# Patient Record
Sex: Male | Born: 1960 | Race: Black or African American | Hispanic: No | Marital: Married | Smoking: Former smoker
Health system: Southern US, Community
[De-identification: ages and names within clinical notes are randomized; demographics above are authoritative.]

## PROBLEM LIST (undated history)

## (undated) DIAGNOSIS — J40 Bronchitis, not specified as acute or chronic: Secondary | ICD-10-CM

## (undated) DIAGNOSIS — R3915 Urgency of urination: Secondary | ICD-10-CM

## (undated) DIAGNOSIS — D631 Anemia in chronic kidney disease: Secondary | ICD-10-CM

## (undated) DIAGNOSIS — J189 Pneumonia, unspecified organism: Secondary | ICD-10-CM

## (undated) DIAGNOSIS — N186 End stage renal disease: Secondary | ICD-10-CM

## (undated) DIAGNOSIS — F32A Depression, unspecified: Secondary | ICD-10-CM

## (undated) DIAGNOSIS — N2581 Secondary hyperparathyroidism of renal origin: Secondary | ICD-10-CM

## (undated) DIAGNOSIS — N529 Male erectile dysfunction, unspecified: Secondary | ICD-10-CM

## (undated) DIAGNOSIS — N179 Acute kidney failure, unspecified: Secondary | ICD-10-CM

## (undated) DIAGNOSIS — G4733 Obstructive sleep apnea (adult) (pediatric): Secondary | ICD-10-CM

## (undated) DIAGNOSIS — R0989 Other specified symptoms and signs involving the circulatory and respiratory systems: Secondary | ICD-10-CM

## (undated) DIAGNOSIS — T7840XA Allergy, unspecified, initial encounter: Secondary | ICD-10-CM

## (undated) DIAGNOSIS — F419 Anxiety disorder, unspecified: Secondary | ICD-10-CM

## (undated) DIAGNOSIS — E669 Obesity, unspecified: Secondary | ICD-10-CM

## (undated) DIAGNOSIS — I10 Essential (primary) hypertension: Secondary | ICD-10-CM

## (undated) DIAGNOSIS — K2950 Unspecified chronic gastritis without bleeding: Secondary | ICD-10-CM

## (undated) DIAGNOSIS — C7951 Secondary malignant neoplasm of bone: Secondary | ICD-10-CM

## (undated) DIAGNOSIS — Z860101 Personal history of adenomatous and serrated colon polyps: Secondary | ICD-10-CM

## (undated) DIAGNOSIS — R600 Localized edema: Secondary | ICD-10-CM

## (undated) DIAGNOSIS — D649 Anemia, unspecified: Secondary | ICD-10-CM

## (undated) DIAGNOSIS — E872 Acidosis: Secondary | ICD-10-CM

## (undated) DIAGNOSIS — Z9889 Other specified postprocedural states: Secondary | ICD-10-CM

## (undated) DIAGNOSIS — N189 Chronic kidney disease, unspecified: Secondary | ICD-10-CM

## (undated) DIAGNOSIS — R002 Palpitations: Secondary | ICD-10-CM

## (undated) DIAGNOSIS — M1990 Unspecified osteoarthritis, unspecified site: Secondary | ICD-10-CM

## (undated) DIAGNOSIS — N184 Chronic kidney disease, stage 4 (severe): Secondary | ICD-10-CM

## (undated) DIAGNOSIS — Z992 Dependence on renal dialysis: Secondary | ICD-10-CM

## (undated) DIAGNOSIS — N1330 Unspecified hydronephrosis: Secondary | ICD-10-CM

## (undated) DIAGNOSIS — R0981 Nasal congestion: Secondary | ICD-10-CM

## (undated) DIAGNOSIS — K219 Gastro-esophageal reflux disease without esophagitis: Secondary | ICD-10-CM

## (undated) DIAGNOSIS — S52209A Unspecified fracture of shaft of unspecified ulna, initial encounter for closed fracture: Secondary | ICD-10-CM

## (undated) DIAGNOSIS — C61 Malignant neoplasm of prostate: Secondary | ICD-10-CM

## (undated) DIAGNOSIS — I12 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 stage renal disease: Secondary | ICD-10-CM

## (undated) DIAGNOSIS — Z973 Presence of spectacles and contact lenses: Secondary | ICD-10-CM

## (undated) DIAGNOSIS — R7301 Impaired fasting glucose: Secondary | ICD-10-CM

## (undated) DIAGNOSIS — Z8619 Personal history of other infectious and parasitic diseases: Secondary | ICD-10-CM

## (undated) DIAGNOSIS — N185 Chronic kidney disease, stage 5: Secondary | ICD-10-CM

## (undated) DIAGNOSIS — A6002 Herpesviral infection of other male genital organs: Secondary | ICD-10-CM

## (undated) DIAGNOSIS — M109 Gout, unspecified: Secondary | ICD-10-CM

## (undated) DIAGNOSIS — Z8719 Personal history of other diseases of the digestive system: Secondary | ICD-10-CM

## (undated) DIAGNOSIS — Z86010 Personal history of colonic polyps: Secondary | ICD-10-CM

## (undated) HISTORY — DX: Male erectile dysfunction, unspecified: N52.9

## (undated) HISTORY — PX: UPPER GASTROINTESTINAL ENDOSCOPY: SHX188

## (undated) HISTORY — DX: Herpesviral infection of other male genital organs: A60.02

## (undated) HISTORY — DX: Obesity, unspecified: E66.9

## (undated) HISTORY — DX: Essential (primary) hypertension: I10

## (undated) HISTORY — PX: COLONOSCOPY: SHX174

## (undated) HISTORY — DX: Allergy, unspecified, initial encounter: T78.40XA

## (undated) HISTORY — DX: Impaired fasting glucose: R73.01

---
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
## 2001-05-12 ENCOUNTER — Encounter: Payer: Self-pay | Admitting: Emergency Medicine

## 2001-05-12 ENCOUNTER — Emergency Department (HOSPITAL_COMMUNITY): Admission: EM | Admit: 2001-05-12 | Discharge: 2001-05-12 | Payer: Self-pay | Admitting: Emergency Medicine

## 2001-05-20 ENCOUNTER — Emergency Department (HOSPITAL_COMMUNITY): Admission: EM | Admit: 2001-05-20 | Discharge: 2001-05-20 | Payer: Self-pay | Admitting: Emergency Medicine

## 2004-10-02 DIAGNOSIS — I10 Essential (primary) hypertension: Secondary | ICD-10-CM

## 2004-10-02 HISTORY — DX: Essential (primary) hypertension: I10

## 2005-08-04 HISTORY — PX: TRICEPS TENDON REPAIR: SHX2577

## 2006-01-30 ENCOUNTER — Emergency Department (HOSPITAL_COMMUNITY): Admission: EM | Admit: 2006-01-30 | Discharge: 2006-01-30 | Payer: Self-pay

## 2006-03-13 ENCOUNTER — Inpatient Hospital Stay (HOSPITAL_COMMUNITY): Admission: AD | Admit: 2006-03-13 | Discharge: 2006-03-15 | Payer: Self-pay | Admitting: Orthopedic Surgery

## 2006-08-23 ENCOUNTER — Ambulatory Visit: Payer: Self-pay | Admitting: Family Medicine

## 2007-06-25 ENCOUNTER — Ambulatory Visit: Payer: Self-pay | Admitting: Family Medicine

## 2007-08-29 ENCOUNTER — Ambulatory Visit: Payer: Self-pay | Admitting: Family Medicine

## 2007-11-07 ENCOUNTER — Ambulatory Visit: Payer: Self-pay | Admitting: Family Medicine

## 2008-03-24 ENCOUNTER — Ambulatory Visit: Payer: Self-pay | Admitting: Family Medicine

## 2008-03-27 ENCOUNTER — Ambulatory Visit: Payer: Self-pay | Admitting: Family Medicine

## 2009-05-13 ENCOUNTER — Ambulatory Visit: Payer: Self-pay | Admitting: Family Medicine

## 2009-08-31 ENCOUNTER — Ambulatory Visit: Payer: Self-pay | Admitting: Family Medicine

## 2009-12-01 ENCOUNTER — Ambulatory Visit: Payer: Self-pay | Admitting: Physician Assistant

## 2010-06-14 ENCOUNTER — Ambulatory Visit: Payer: Self-pay | Admitting: Family Medicine

## 2010-07-26 ENCOUNTER — Ambulatory Visit
Admission: RE | Admit: 2010-07-26 | Discharge: 2010-07-26 | Payer: Self-pay | Source: Home / Self Care | Attending: Family Medicine | Admitting: Family Medicine

## 2010-08-23 ENCOUNTER — Ambulatory Visit
Admission: RE | Admit: 2010-08-23 | Discharge: 2010-08-23 | Disposition: A | Discharge status: Home / Self Care | Payer: Managed Care, Other (non HMO) | Source: Ambulatory Visit | Attending: Family Medicine | Admitting: Family Medicine

## 2010-08-23 ENCOUNTER — Other Ambulatory Visit: Payer: Self-pay | Admitting: Family Medicine

## 2010-08-23 DIAGNOSIS — M542 Cervicalgia: Secondary | ICD-10-CM

## 2010-11-19 NOTE — Op Note (Signed)
NAME:  Devon Cook, PUENTE NO.:  [id number]   MEDICAL RECORD NO.:  [id number]          PATIENT TYPE:  INP   LOCATION:  5011                         FACILITY:  [facility]   PHYSICIAN:  Robert A. Noemi Chapel, M.D. DATE OF BIRTH:  [birth year]   DATE OF PROCEDURE:  03/13/2006  DATE OF DISCHARGE:                                 OPERATIVE REPORT   PREOPERATIVE DIAGNOSIS:  Left arm infection status post left triceps tendon  rupture repair.   POSTOPERATIVE DIAGNOSIS:  Left arm infection status post left triceps tendon  rupture repair.   PROCEDURE:  Left arm irrigation and debridement with partial triceps tendon  repair debridement.   SURGEON:  Audree Camel. Noemi Chapel, M.D.   ASSISTANT:  Matthew Saras, P.A.   ANESTHESIA:  General.   OPERATIVE TIME:  45 minutes.   COMPLICATIONS:  None.   INDICATIONS FOR PROCEDURE:  Mr. Bajor is a 50 year old gentleman who  sustained a left triceps tendon rupture approximately 6 weeks ago.  Underwent a triceps tendon repair approximately 4 weeks ago.  He has done  well with his initial recovery until the last 2 to 3 days when he started  developing increased pain, swelling and fevers.  Presented to the office  today with obvious infection in his left arm.  Aspiration showed purulent  material and is now to undergo irrigation, debridement of this.   DESCRIPTION:  Mr. Tucker was brought to operating room on 03/13/2006, placed  operative table supine position.  After adequate level general anesthesia  was obtained, his left arm was prepped using sterile DuraPrep and draped  using sterile technique.  He received vancomycin 1.5 grams IV  preoperatively.  He had already had cultures taken in the office.  These  have shown gram positive cocci in pairs and clusters.  Arm was exsanguinated  and a sterile tourniquet elevated 300 mm.  Initially through a 8-10 mm  longitudinal incision made over his previous incision, first triceps tendon  repair, initial  exposure was made.  The underlying subcutaneous tissues were  incised in line with the skin incision.  On entering the triceps tendon  region, purulent material was found and this was thoroughly irrigated.  The  ulnar nerve and radial nerve carefully protected while the triceps tendon  area was evaluated.  There was partial loss of suture fixation but at least  50% of the triceps tendon remained well attached.  The previous bone anchor  was still in place in the olecranon.  This was removed.  It was buried in  the bone.  After a thorough debridement had been carried out and 3 liters of  jet lavage solution were irrigated through the area, the joint was not  entered because it was not felt to be part of the infection.  After this  irrigation and debridement had been carried out, the tourniquet was  released.  Hemostasis was obtained with cautery.  I did not feel that  putting further sutures in the triceps tendon. i.e. a foreign material,  would be in the best interest of the infection treatment at this time.  At  this point it was felt that the arm could be splinted in full extension in  order to take any the pressure off the remaining portions of the  repair.  The wound was closed by closing only the skin using 2-0 nylon  retention sutures.  Sterile dressings were applied and then the arm was  splinted in full extension.  The patient awakened and taken to recovery in  stable condition.  Needle and sponge count was correct x2 at the end of the  case.      Robert A. Noemi Chapel, M.D.  Electronically Signed     RAW/MEDQ  D:  03/13/2006  T:  03/14/2006  Job:  [id number]

## 2010-11-19 NOTE — Discharge Summary (Signed)
NAME:  Devon Cook, CHENG NO.:  [id number]   MEDICAL RECORD NO.:  [id number]          PATIENT TYPE:  INP   LOCATION:  5011                         FACILITY:  [facility]   PHYSICIAN:  Robert A. Noemi Chapel, M.D. DATE OF BIRTH:  [birth year]   DATE OF ADMISSION:  03/13/2006  DATE OF DISCHARGE:  03/15/2006                                 DISCHARGE SUMMARY   ADMITTING DIAGNOSIS:  Infection left elbow, status post triceps repair.   HISTORY OF PRESENT ILLNESS:  The patient is a 50 year old white male who  underwent an acute repair of a ruptured triceps on February 10, 2006, as an  outpatient.  Over the past couple of days, he has increased pain.  Over the  weekend he had a fever of 103.  He presented to the office with a very large  painful swollen elbow.   Aspiration was undertaken in the office that showed gram positive cocci.  He  was admitted for emergent I&D and IV antibiotics.  The patient understands  risks, benefits, and possible complications of the procedure and is without  question.   PROCEDURES IN-HOUSE:  On March 13, 2006, the patient underwent I&D of  his left elbow by Dr. Noemi Chapel.   He tolerated the procedure well and was admitted postoperatively for pain  control and IV antibiotics.  He was started on vancomycin prophylactically  after cultures were obtained.  Post-op day #1 his hemoglobin was 10.3.  His  white cell count was 11.2.  His gram stain showed gram positive cocci in  clusters.  He was metabolically stable and in much less pain.  Post-op day  #2, cultures grew out Staph aureus that was sensitive to Levaquin but not to  penicillin.   He was discharged to home on:  1. Levaquin 750, one tablet a day.  2. Percocet 5/325, one to two every four to six hours p.r.n. pain.  3. Robaxin 750 mg, one q.6 h p.r.n. muscle spasm .  4. Benicar 20 mg daily.  5. Colace 100 mg one p.o. b.i.d.   He has been instructed to elevate his left arm on many pillows, not to  lift  anything heavy, and not to get his dressing wet.   He will follow up with Dr. Noemi Chapel on March 20, 2006, for a wound check  at that time.     Kirstin Shepperson, P.A.      Robert A. Noemi Chapel, M.D.  Electronically Signed   KS/MEDQ  D:  04/12/2006  T:  04/14/2006  Job:  [id number]

## 2010-11-22 ENCOUNTER — Ambulatory Visit (INDEPENDENT_AMBULATORY_CARE_PROVIDER_SITE_OTHER): Payer: Managed Care, Other (non HMO) | Admitting: Family Medicine

## 2010-11-22 ENCOUNTER — Encounter: Payer: Self-pay | Admitting: Family Medicine

## 2010-11-22 VITALS — BP 120/88 | HR 78 | Wt 304.0 lb

## 2010-11-22 DIAGNOSIS — B351 Tinea unguium: Secondary | ICD-10-CM

## 2010-11-22 DIAGNOSIS — A599 Trichomoniasis, unspecified: Secondary | ICD-10-CM

## 2010-11-22 MED ORDER — METRONIDAZOLE 500 MG PO TABS: 500.0000 mg | ORAL_TABLET | ORAL | Status: AC

## 2010-11-22 NOTE — Patient Instructions (Signed)
Take all the medication and if any further problems give me a call

## 2010-11-22 NOTE — Progress Notes (Signed)
  Subjective:    Patient ID: Devon Cook, male    DOB: [birth year], 50 y.o.   MRN: [id number]  HPI he is here for evaluation of discoloration of his great toe. He also apparently was recently told that he could potentially have Trichomonas. He is having no urinary symptoms.    Review of Systems     Objective:   Physical Exam exam of his left great toe does show thickening and some slight discoloration. Exam of the penis shows an uncircumcised male with no evidence of infection.        Assessment & Plan:  Onychomycosis Probable trichomoniasis I explained the treatment of onychomycosis and at this time he will for any further therapy. Flagyl was called in with comments about alcohol consumption while taking this.

## 2011-04-04 ENCOUNTER — Telehealth: Payer: Self-pay | Admitting: Family Medicine

## 2011-04-04 NOTE — Telephone Encounter (Signed)
Let him know that he really needs somebody to examine him and make sure what's going on. Recommend he go to an urgent care Center

## 2011-04-04 NOTE — Telephone Encounter (Signed)
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  Pt having UTI and wants same antibiotic as you gave him in May 2012.  He is in [place] and needs some relief.  Pt said call it into [facility] on [address] and he can stop at [facility] in [place] for refill.  I advised pt, not sure doctor would refill.  Please advise pt.

## 2011-04-04 NOTE — Telephone Encounter (Signed)
Called pt informed him Dr.L wanted him to be examined to go to an urgent care were he is at

## 2011-04-11 ENCOUNTER — Ambulatory Visit: Payer: Managed Care, Other (non HMO) | Admitting: Family Medicine

## 2011-04-26 ENCOUNTER — Encounter: Payer: Self-pay | Admitting: Family Medicine

## 2011-08-29 ENCOUNTER — Other Ambulatory Visit: Payer: Self-pay | Admitting: Family Medicine

## 2011-08-29 NOTE — Telephone Encounter (Signed)
Pt need Lisinopril HCTZ 20/25 mg 1 qd at [facility].  Out of refills at the pharmacy.  Pt scheduled CPE for  10/04/11.

## 2011-10-04 ENCOUNTER — Encounter: Payer: Managed Care, Other (non HMO) | Admitting: Family Medicine

## 2011-11-29 ENCOUNTER — Encounter: Payer: Managed Care, Other (non HMO) | Admitting: Family Medicine

## 2011-12-22 ENCOUNTER — Encounter: Payer: Managed Care, Other (non HMO) | Admitting: Family Medicine

## 2012-01-09 ENCOUNTER — Ambulatory Visit (INDEPENDENT_AMBULATORY_CARE_PROVIDER_SITE_OTHER): Payer: BC Managed Care – PPO | Admitting: Family Medicine

## 2012-01-09 ENCOUNTER — Encounter: Payer: Self-pay | Admitting: Family Medicine

## 2012-01-09 VITALS — BP 140/80 | HR 77 | Ht 72.5 in | Wt 310.0 lb

## 2012-01-09 DIAGNOSIS — R7301 Impaired fasting glucose: Secondary | ICD-10-CM | POA: Insufficient documentation

## 2012-01-09 DIAGNOSIS — I10 Essential (primary) hypertension: Secondary | ICD-10-CM

## 2012-01-09 DIAGNOSIS — Z0000 Encounter for general adult medical examination without abnormal findings: Secondary | ICD-10-CM

## 2012-01-09 DIAGNOSIS — N529 Male erectile dysfunction, unspecified: Secondary | ICD-10-CM

## 2012-01-09 DIAGNOSIS — Z6841 Body Mass Index (BMI) 40.0 and over, adult: Secondary | ICD-10-CM | POA: Insufficient documentation

## 2012-01-09 DIAGNOSIS — Z23 Encounter for immunization: Secondary | ICD-10-CM

## 2012-01-09 LAB — COMPREHENSIVE METABOLIC PANEL
ALT: 95 U/L — ABNORMAL HIGH (ref 0–53)
CO2: 25 mEq/L (ref 19–32)
Calcium: 8.6 mg/dL (ref 8.4–10.5)
Chloride: 107 mEq/L (ref 96–112)
Creat: 1.02 mg/dL (ref 0.50–1.35)
Total Protein: 7.2 g/dL (ref 6.0–8.3)

## 2012-01-09 LAB — CBC WITH DIFFERENTIAL/PLATELET
Eosinophils Relative: 3 % (ref 0–5)
HCT: 43.2 % (ref 39.0–52.0)
Hemoglobin: 15.5 g/dL (ref 13.0–17.0)
Lymphocytes Relative: 34 % (ref 12–46)
Lymphs Abs: 2.7 10*3/uL (ref 0.7–4.0)
MCV: 75.5 fL — ABNORMAL LOW (ref 78.0–100.0)
Monocytes Absolute: 1 10*3/uL (ref 0.1–1.0)
Platelets: 168 10*3/uL (ref 150–400)
RBC: 5.72 MIL/uL (ref 4.22–5.81)
WBC: 7.7 10*3/uL (ref 4.0–10.5)

## 2012-01-09 LAB — LIPID PANEL
Cholesterol: 96 mg/dL (ref 0–200)
Total CHOL/HDL Ratio: 2.5 Ratio

## 2012-01-09 LAB — POCT URINALYSIS DIPSTICK
Bilirubin, UA: NEGATIVE
Glucose, UA: NEGATIVE
Ketones, UA: NEGATIVE
Spec Grav, UA: 1.01

## 2012-01-09 MED ORDER — TADALAFIL 20 MG PO TABS: 10.0000 mg | ORAL_TABLET | ORAL | Status: DC | PRN

## 2012-01-09 MED ORDER — LISINOPRIL-HYDROCHLOROTHIAZIDE 20-25 MG PO TABS: 1.0000 | ORAL_TABLET | Freq: Every day | ORAL | Status: DC

## 2012-01-09 NOTE — Progress Notes (Signed)
Subjective:    Patient ID: Devon Cook, male    DOB: 07/14/1960, 51 y.o.   MRN: [id number]  HPI He is here for complete examination. He recently had a DOT exam which did show protein in his urine however the day before that he did play golf. He continues to have difficulty with erectile dysfunction and would like a refill on his Cialis. He continues on lisinopril and is having no difficulty with this. He has lost weight in the past when he put himself on a diet and exercise program however he's gained it back. 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 His social history was reviewed. Family history significant for uncles and grandfather with prostate cancer.  Review of Systems  Constitutional: Negative for activity change.  HENT: Negative.   Eyes: Negative.   Respiratory: Negative.   Cardiovascular: Negative.   Gastrointestinal: Negative.   Genitourinary: Negative.   Musculoskeletal: Negative.   Neurological: Negative.        Objective:   Physical Exam BP 140/80  Pulse 77  Ht 6' 0.5" (1.842 m)  Wt 310 lb (140.615 kg)  BMI 41.47 kg/m2  SpO2 96%  General Appearance:    Alert, cooperative, no distress, appears stated age  Head:    Normocephalic, without obvious abnormality, atraumatic  Eyes:    PERRL, conjunctiva/corneas clear, EOM's intact, fundi    benign  Ears:    Normal TM's and external ear canals  Nose:   Nares normal, mucosa normal, no drainage or sinus   tenderness  Throat:   Lips, mucosa, and tongue normal; teeth and gums normal  Neck:   Supple, no lymphadenopathy;  thyroid:  no   enlargement/tenderness/nodules; no carotid   bruit or JVD  Back:    Spine nontender, no curvature, ROM normal, no CVA     tenderness  Lungs:     Clear to auscultation bilaterally without wheezes, rales or     ronchi; respirations unlabored  Chest Wall:    No tenderness or deformity   Heart:    Regular rate and rhythm, S1 and S2 normal, no  murmur, rub   or gallop  Breast Exam:    No chest wall tenderness, masses or gynecomastia  Abdomen:     Soft, non-tender, nondistended, normoactive bowel sounds,    no masses, no hepatosplenomegaly  Genitalia:    Normal male external genitalia without lesions.  Testicles without masses.  No inguinal hernias.     Extremities:   No clubbing, cyanosis or edema  Pulses:   2+ and symmetric all extremities  Skin:   Skin color, texture, turgor normal, no rashes or lesions  Lymph nodes:   Cervical, supraclavicular, and axillary nodes normal  Neurologic:   CNII-XII intact, normal strength, sensation and gait; reflexes 2+ and symmetric throughout          Psych:   Normal mood, affect, hygiene and grooming.          Assessment & Plan:   1. Routine general medical examination at a health care facility  POCT Urinalysis Dipstick, CBC with Differential, Comprehensive metabolic panel, Lipid panel, HM COLONOSCOPY, Tdap vaccine greater than or equal to 7yo IM, PSA  2. ED (erectile dysfunction)  tadalafil (CIALIS) 20 MG tablet  3. Hypertension  lisinopril-hydrochlorothiazide (PRINZIDE,ZESTORETIC) 20-25 MG per tablet  4. Morbid obesity with BMI of 40.0-44.9, adult  Amb ref to Medical Nutrition Therapy-MNT  5. Impaired fasting glucose  discuss with him the need to make permanent lifestyle changes in regard to his physical activities and eating habits. I will refer him to nutritionist and followup in several months.

## 2012-09-16 ENCOUNTER — Emergency Department (HOSPITAL_COMMUNITY): Payer: BC Managed Care – PPO

## 2012-09-16 ENCOUNTER — Emergency Department (HOSPITAL_COMMUNITY)
Admission: EM | Admit: 2012-09-16 | Discharge: 2012-09-16 | Disposition: A | Discharge status: Home / Self Care | Payer: BC Managed Care – PPO | Attending: Emergency Medicine | Admitting: Emergency Medicine

## 2012-09-16 ENCOUNTER — Encounter (HOSPITAL_COMMUNITY): Payer: Self-pay | Admitting: Emergency Medicine

## 2012-09-16 DIAGNOSIS — F17200 Nicotine dependence, unspecified, uncomplicated: Secondary | ICD-10-CM | POA: Insufficient documentation

## 2012-09-16 DIAGNOSIS — S058X9A Other injuries of unspecified eye and orbit, initial encounter: Secondary | ICD-10-CM | POA: Insufficient documentation

## 2012-09-16 DIAGNOSIS — H1131 Conjunctival hemorrhage, right eye: Secondary | ICD-10-CM

## 2012-09-16 DIAGNOSIS — I10 Essential (primary) hypertension: Secondary | ICD-10-CM | POA: Insufficient documentation

## 2012-09-16 DIAGNOSIS — M7989 Other specified soft tissue disorders: Secondary | ICD-10-CM

## 2012-09-16 DIAGNOSIS — S0501XA Injury of conjunctiva and corneal abrasion without foreign body, right eye, initial encounter: Secondary | ICD-10-CM

## 2012-09-16 DIAGNOSIS — S6990XA Unspecified injury of unspecified wrist, hand and finger(s), initial encounter: Secondary | ICD-10-CM | POA: Insufficient documentation

## 2012-09-16 DIAGNOSIS — S0180XA Unspecified open wound of other part of head, initial encounter: Secondary | ICD-10-CM | POA: Insufficient documentation

## 2012-09-16 DIAGNOSIS — Z87448 Personal history of other diseases of urinary system: Secondary | ICD-10-CM | POA: Insufficient documentation

## 2012-09-16 DIAGNOSIS — E669 Obesity, unspecified: Secondary | ICD-10-CM | POA: Insufficient documentation

## 2012-09-16 DIAGNOSIS — Z79899 Other long term (current) drug therapy: Secondary | ICD-10-CM | POA: Insufficient documentation

## 2012-09-16 DIAGNOSIS — H1130 Conjunctival hemorrhage, unspecified eye: Secondary | ICD-10-CM | POA: Insufficient documentation

## 2012-09-16 DIAGNOSIS — Z8619 Personal history of other infectious and parasitic diseases: Secondary | ICD-10-CM | POA: Insufficient documentation

## 2012-09-16 MED ORDER — IBUPROFEN 800 MG PO TABS: 800.0000 mg | ORAL_TABLET | Freq: Three times a day (TID) | ORAL | Status: DC | PRN

## 2012-09-16 MED ORDER — CIPROFLOXACIN HCL 0.3 % OP SOLN: 1.0000 [drp] | Freq: Four times a day (QID) | OPHTHALMIC | Status: DC

## 2012-09-16 MED ORDER — AMOXICILLIN-POT CLAVULANATE 875-125 MG PO TABS: 1.0000 | ORAL_TABLET | Freq: Two times a day (BID) | ORAL | Status: DC

## 2012-09-16 NOTE — ED Notes (Signed)
Pt presenting to ed with c/o injuring bilateral thumbs and bilateral hands yesterday while playing basketball.
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
## 2012-09-16 NOTE — ED Notes (Signed)
Eye box at bedside

## 2012-09-18 NOTE — ED Provider Notes (Signed)
Medical screening examination/treatment/procedure(s) were performed by non-physician practitioner and as supervising physician I was immediately available for consultation/collaboration.    Kathalene Frames, MD 09/18/12 [phone]

## 2012-10-09 ENCOUNTER — Telehealth: Payer: Self-pay | Admitting: Family Medicine

## 2012-10-09 NOTE — Telephone Encounter (Signed)
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  He in coming through [place].  He will not be into [place] until Friday.  I asked if he would stop when he gets to a [facility], we will call in the Lisinopril [facility].  He will do so.

## 2012-10-10 ENCOUNTER — Telehealth: Payer: Self-pay | Admitting: Family Medicine

## 2012-10-10 NOTE — Telephone Encounter (Signed)
Needs refill called in, he is  out of town Lisinopril  [place]

## 2012-10-11 ENCOUNTER — Other Ambulatory Visit: Payer: Self-pay

## 2012-10-11 DIAGNOSIS — I10 Essential (primary) hypertension: Secondary | ICD-10-CM

## 2012-10-11 MED ORDER — LISINOPRIL-HYDROCHLOROTHIAZIDE 20-25 MG PO TABS: 1.0000 | ORAL_TABLET | Freq: Every day | ORAL | Status: DC

## 2012-10-11 NOTE — Telephone Encounter (Signed)
THIS HAS ALREADY BEEN TAKEN CARE OF

## 2012-10-11 NOTE — Telephone Encounter (Signed)
Sent in b/p med

## 2012-10-11 NOTE — Telephone Encounter (Signed)
This is Dr. Lanice Shirts patient, thus, sending to you for refill

## 2012-10-11 NOTE — Telephone Encounter (Signed)
Renew this to cover him until July.

## 2013-01-31 ENCOUNTER — Telehealth: Payer: Self-pay | Admitting: Family Medicine

## 2013-01-31 NOTE — Telephone Encounter (Signed)
Please call pt as he wants a referral to a urologist

## 2013-01-31 NOTE — Telephone Encounter (Signed)
DR.LALONDE IS THIS OK

## 2013-01-31 NOTE — Telephone Encounter (Signed)
Need find out why. If it is for a vasectomy than yes . Otherwise probably schedule with me

## 2013-02-04 NOTE — Telephone Encounter (Signed)
Pt wants to be circumcised ok by jcl to refer faxed referral to [facility] urology

## 2013-04-24 ENCOUNTER — Telehealth: Payer: Self-pay | Admitting: Family Medicine

## 2013-04-24 ENCOUNTER — Other Ambulatory Visit: Payer: Self-pay

## 2013-04-24 DIAGNOSIS — I10 Essential (primary) hypertension: Secondary | ICD-10-CM

## 2013-04-24 MED ORDER — LISINOPRIL-HYDROCHLOROTHIAZIDE 20-25 MG PO TABS: 1.0000 | ORAL_TABLET | Freq: Every day | ORAL | Status: DC

## 2013-04-24 NOTE — Telephone Encounter (Signed)
PT called for refill on Lisinopril, he is past due for cpe.  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  [facility]ril ref[facility] to [facility] on [address].

## 2013-04-24 NOTE — Telephone Encounter (Signed)
DONE

## 2013-04-24 NOTE — Telephone Encounter (Signed)
SENT IN B/P MED

## 2013-06-03 ENCOUNTER — Encounter: Payer: Self-pay | Admitting: Family Medicine

## 2013-06-05 ENCOUNTER — Encounter: Payer: Self-pay | Admitting: Family Medicine

## 2013-07-15 ENCOUNTER — Ambulatory Visit (INDEPENDENT_AMBULATORY_CARE_PROVIDER_SITE_OTHER): Payer: BC Managed Care – PPO | Admitting: Family Medicine

## 2013-07-15 ENCOUNTER — Encounter: Payer: Self-pay | Admitting: Family Medicine

## 2013-07-15 ENCOUNTER — Other Ambulatory Visit: Payer: Self-pay | Admitting: Family Medicine

## 2013-07-15 VITALS — BP 140/96 | HR 115 | Ht 74.0 in | Wt 315.0 lb

## 2013-07-15 DIAGNOSIS — Z125 Encounter for screening for malignant neoplasm of prostate: Secondary | ICD-10-CM

## 2013-07-15 DIAGNOSIS — Z6841 Body Mass Index (BMI) 40.0 and over, adult: Secondary | ICD-10-CM

## 2013-07-15 DIAGNOSIS — R768 Other specified abnormal immunological findings in serum: Secondary | ICD-10-CM | POA: Insufficient documentation

## 2013-07-15 DIAGNOSIS — Z0000 Encounter for general adult medical examination without abnormal findings: Secondary | ICD-10-CM

## 2013-07-15 DIAGNOSIS — M542 Cervicalgia: Secondary | ICD-10-CM

## 2013-07-15 DIAGNOSIS — N529 Male erectile dysfunction, unspecified: Secondary | ICD-10-CM

## 2013-07-15 DIAGNOSIS — R894 Abnormal immunological findings in specimens from other organs, systems and tissues: Secondary | ICD-10-CM

## 2013-07-15 DIAGNOSIS — I10 Essential (primary) hypertension: Secondary | ICD-10-CM

## 2013-07-15 LAB — LIPID PANEL
Cholesterol: 119 mg/dL (ref 0–200)
HDL: 44 mg/dL (ref >39)
LDL CALC: 51 mg/dL (ref 0–99)
TRIGLYCERIDES: 120 mg/dL (ref <150)
Total CHOL/HDL Ratio: 2.7 Ratio
VLDL: 24 mg/dL (ref 0–40)

## 2013-07-15 LAB — COMPREHENSIVE METABOLIC PANEL
ALK PHOS: 129 U/L — AB (ref 39–117)
ALT: 107 U/L — ABNORMAL HIGH (ref 0–53)
AST: 92 U/L — ABNORMAL HIGH (ref 0–37)
Albumin: 3.7 g/dL (ref 3.5–5.2)
BILIRUBIN TOTAL: 0.6 mg/dL (ref 0.3–1.2)
BUN: 13 mg/dL (ref 6–23)
CO2: 27 mEq/L (ref 19–32)
Calcium: 9.2 mg/dL (ref 8.4–10.5)
Chloride: 101 mEq/L (ref 96–112)
Creat: 0.96 mg/dL (ref 0.50–1.35)
GLUCOSE: 121 mg/dL — AB (ref 70–99)
Potassium: 3.8 mEq/L (ref 3.5–5.3)
SODIUM: 135 meq/L (ref 135–145)
TOTAL PROTEIN: 8.3 g/dL (ref 6.0–8.3)

## 2013-07-15 LAB — CBC WITH DIFFERENTIAL/PLATELET
BASOS ABS: 0 10*3/uL (ref 0.0–0.1)
BASOS PCT: 0 % (ref 0–1)
Eosinophils Absolute: 0.2 10*3/uL (ref 0.0–0.7)
Eosinophils Relative: 2 % (ref 0–5)
HCT: 46.3 % (ref 39.0–52.0)
Hemoglobin: 16.2 g/dL (ref 13.0–17.0)
Lymphocytes Relative: 28 % (ref 12–46)
Lymphs Abs: 1.8 10*3/uL (ref 0.7–4.0)
MCH: 26.5 pg (ref 26.0–34.0)
MCHC: 35 g/dL (ref 30.0–36.0)
MCV: 75.8 fL — ABNORMAL LOW (ref 78.0–100.0)
Monocytes Absolute: 0.7 10*3/uL (ref 0.1–1.0)
Monocytes Relative: 11 % (ref 3–12)
NEUTROS ABS: 3.8 10*3/uL (ref 1.7–7.7)
NEUTROS PCT: 59 % (ref 43–77)
PLATELETS: 179 10*3/uL (ref 150–400)
RBC: 6.11 MIL/uL — ABNORMAL HIGH (ref 4.22–5.81)
RDW: 14.8 % (ref 11.5–15.5)
WBC: 6.4 10*3/uL (ref 4.0–10.5)

## 2013-07-15 MED ORDER — LISINOPRIL-HYDROCHLOROTHIAZIDE 20-25 MG PO TABS
1.0000 | ORAL_TABLET | Freq: Every day | ORAL | Status: DC
Start: 2013-07-15 — End: 2014-04-14

## 2013-07-15 NOTE — Progress Notes (Signed)
Subjective:    Patient ID: Devon Cook, male    DOB: [birth year], 53 y.o.   MRN: [id number]  HPI He is here for complete examination. He notes over the last year he has had neck pain with radiation into his skull and down his arm but states that he goes to all of his fingers.. This occurred after he was swinging a golf club and felt a cracking and popping sensation. He notes that he can also do this if he coughs. This has been going on for over a year and has not progressed. He has no weakness numbness or tingling. Continues on his present medications. He is interested in weight reduction. 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 He does have underlying ED and is now getting his medications from [place]. Review his record indicates he is positive for hepatitis C type I. He also has a history of impaired fasting glucose. MI and social history were reviewed. He is unaware of his father's medical history but does have a grandfather and uncles that had prostate cancer. States his allergies are under good control. He otherwise has no concerns or complaints.   Review of Systems Negative except as above    Objective:   Physical Exam BP 140/96  Pulse 115  Ht 6\' 2"  (1.88 m)  Wt 315 lb (142.883 kg)  BMI 40.43 kg/m2  General Appearance:    Alert, cooperative, no distress, appears stated age  Head:    Normocephalic, without obvious abnormality, atraumatic  Eyes:    PERRL, conjunctiva/corneas clear, EOM's intact, fundi    benign  Ears:    Normal TM's and external ear canals  Nose:   Nares normal, mucosa normal, no drainage or sinus   tenderness  Throat:   Lips, mucosa, and tongue normal; teeth and gums normal  Neck:   Supple, no lymphadenopathy;  thyroid:  no   enlargement/tenderness/nodules; no carotid   bruit or JVD  Back:    Spine nontender, no curvature, ROM normal, no CVA     tenderness  Lungs:     Clear to  auscultation bilaterally without wheezes, rales or     ronchi; respirations unlabored  Chest Wall:    No tenderness or deformity   Heart:    Regular rate and rhythm, S1 and S2 normal, no murmur, rub   or gallop  Breast Exam:    No chest wall tenderness, masses or gynecomastia  Abdomen:     Soft, non-tender, nondistended, normoactive bowel sounds,    no masses, no hepatosplenomegaly  Genitalia:    Normal male external genitalia without lesions.  Testicles without masses.  No inguinal hernias.     Extremities:   No clubbing, cyanosis or edema. Normal motor ensoyand DTRs of his arms.  Pulses:   2+ and symmetric all extremities  Skin:   Skin color, texture, turgor normal, no rashes or lesions  Lymph nodes:   Cervical, supraclavicular, and axillary nodes normal  Neurologic:   CNII-XII intact, normal strength, sensation and gait; reflexes 2+ and symmetric throughout          Psych:   Normal mood, affect, hygiene and grooming.           Assessment & Plan:  Routine general medical examination at a health care facility - Plan: Ambulatory referral to Gastroenterology, Comprehensive metabolic panel, Lipid panel  Neck pain on right side - Plan: DG  Cervical Spine 2 or 3 views  Morbid obesity with BMI of 40.0-44.9, adult - Plan: Amb ref to Medical Nutrition Therapy-MNT, Comprehensive metabolic panel, Lipid panel, CBC with Differential  Hypertension - Plan: lisinopril-hydrochlorothiazide (PRINZIDE,ZESTORETIC) 20-25 MG per tablet  ED (erectile dysfunction)  Hepatitis C antibody test positive - Plan: Hepatitis C RNA quantitative  Special screening for malignant neoplasm of prostate - Plan: PSA Discussed diet and exercise with him especially in regard to his work schedule. Also discussed the neck pain. I will get an x-ray on him. Since he is having very little difficulty and no worsening of his symptoms, we will treat this conservatively. He is comfortable with that. Discussed referral for  colonoscopy and possible referral to liver specialist pending hepatitis C viral load.

## 2013-07-16 LAB — HEPATITIS C RNA QUANTITATIVE
HCV QUANT: 790790 [IU]/mL — AB (ref <15)
HCV Quantitative Log: 5.9 {Log} — ABNORMAL HIGH (ref <1.18)

## 2013-07-16 LAB — PSA: PSA: 4.55 ng/mL — ABNORMAL HIGH (ref <=4.00)

## 2013-07-16 NOTE — Progress Notes (Signed)
Denice Paradise notified to add labs

## 2013-07-17 LAB — PSA, TOTAL AND FREE
PSA FREE PCT: 19 % — AB (ref >25)
PSA FREE: 0.9 ng/mL
PSA: 4.76 ng/mL — ABNORMAL HIGH (ref <=4.00)

## 2013-07-19 NOTE — Progress Notes (Signed)
His hepatitis C. viral load is still present. This was discussed with him and he is willing to pursue this further. We will set him up for further evaluation. Also his PSA was elevated. I explained this to him in detail including his % PSA. Discussed referral to urology versus repeating this in 6 months and the potential risk. Discussed the fact that prostate cancer usually slow-growing. He would like to wait 6 months.

## 2013-11-04 ENCOUNTER — Ambulatory Visit: Payer: BC Managed Care – PPO | Admitting: Dietician

## 2013-11-27 ENCOUNTER — Emergency Department (HOSPITAL_COMMUNITY)
Admission: EM | Admit: 2013-11-27 | Discharge: 2013-11-27 | Disposition: A | Discharge status: Home / Self Care | Payer: Worker's Compensation | Attending: Emergency Medicine | Admitting: Emergency Medicine

## 2013-11-27 ENCOUNTER — Encounter (HOSPITAL_COMMUNITY): Payer: Self-pay | Admitting: Emergency Medicine

## 2013-11-27 ENCOUNTER — Emergency Department (HOSPITAL_COMMUNITY): Payer: Worker's Compensation

## 2013-11-27 DIAGNOSIS — S46319A Strain of muscle, fascia and tendon of triceps, unspecified arm, initial encounter: Secondary | ICD-10-CM

## 2013-11-27 DIAGNOSIS — I10 Essential (primary) hypertension: Secondary | ICD-10-CM | POA: Insufficient documentation

## 2013-11-27 DIAGNOSIS — Z792 Long term (current) use of antibiotics: Secondary | ICD-10-CM | POA: Diagnosis not present

## 2013-11-27 DIAGNOSIS — Z8619 Personal history of other infectious and parasitic diseases: Secondary | ICD-10-CM | POA: Insufficient documentation

## 2013-11-27 DIAGNOSIS — Z9889 Other specified postprocedural states: Secondary | ICD-10-CM | POA: Insufficient documentation

## 2013-11-27 DIAGNOSIS — F17200 Nicotine dependence, unspecified, uncomplicated: Secondary | ICD-10-CM | POA: Diagnosis not present

## 2013-11-27 DIAGNOSIS — IMO0002 Reserved for concepts with insufficient information to code with codable children: Secondary | ICD-10-CM | POA: Insufficient documentation

## 2013-11-27 DIAGNOSIS — Z79899 Other long term (current) drug therapy: Secondary | ICD-10-CM | POA: Diagnosis not present

## 2013-11-27 DIAGNOSIS — E669 Obesity, unspecified: Secondary | ICD-10-CM | POA: Insufficient documentation

## 2013-11-27 DIAGNOSIS — N529 Male erectile dysfunction, unspecified: Secondary | ICD-10-CM | POA: Diagnosis not present

## 2013-11-27 DIAGNOSIS — S46909A Unspecified injury of unspecified muscle, fascia and tendon at shoulder and upper arm level, unspecified arm, initial encounter: Secondary | ICD-10-CM | POA: Diagnosis present

## 2013-11-27 DIAGNOSIS — X500XXA Overexertion from strenuous movement or load, initial encounter: Secondary | ICD-10-CM | POA: Diagnosis not present

## 2013-11-27 DIAGNOSIS — S4980XA Other specified injuries of shoulder and upper arm, unspecified arm, initial encounter: Secondary | ICD-10-CM | POA: Diagnosis present

## 2013-11-27 DIAGNOSIS — Y9389 Activity, other specified: Secondary | ICD-10-CM | POA: Insufficient documentation

## 2013-11-27 DIAGNOSIS — Y9289 Other specified places as the place of occurrence of the external cause: Secondary | ICD-10-CM | POA: Insufficient documentation

## 2013-11-27 MED ORDER — HYDROMORPHONE HCL PF 1 MG/ML IJ SOLN
1.0000 mg | Freq: Once | INTRAMUSCULAR | Status: AC
  Administered 2013-11-27: 1 mg via INTRAMUSCULAR
  Filled 2013-11-27: qty 1

## 2013-11-27 MED ORDER — HYDROCODONE-ACETAMINOPHEN 5-325 MG PO TABS: 1.0000 | ORAL_TABLET | Freq: Four times a day (QID) | ORAL | Status: DC | PRN

## 2013-11-27 MED ORDER — DIAZEPAM 5 MG PO TABS
5.0000 mg | ORAL_TABLET | Freq: Once | ORAL | Status: AC
  Administered 2013-11-27: 5 mg via ORAL
  Filled 2013-11-27: qty 1

## 2013-11-27 MED ORDER — DIAZEPAM 5 MG PO TABS: 5.0000 mg | ORAL_TABLET | Freq: Two times a day (BID) | ORAL | Status: DC

## 2013-11-27 NOTE — ED Notes (Signed)
Pt. Returned from X-ray. Pt given Ginger Ale.

## 2013-11-27 NOTE — Discharge Instructions (Signed)
Triceps Tendon Rupture The triceps muscle is located on the backside of the upper arm and is responsible for straightening the elbow and extending the upper arm backwards. A triceps tendon rupture is a complete tear of the tendon that attaches the triceps muscle to the ulna (one of the forearm bones). A triceps tendon rupture results in decreased triceps function. SYMPTOMS   Pain, tenderness, inflammation and/or bruising over the injury (contusion).  "Popping" or tearing sensation felt and/or heard in the elbow at the time of injury.  Decreased ability to straighten the elbow or extend the shoulder.  A crackling sound (crepitation) when the tendon is moved or touched.  Loss of firm fullness when pushing on the area where the tendon ruptured. CAUSES  Triceps tendon ruptures occur when a force is placed on the tendon that is greater than it can withstand. Common mechanisms of injury include:  Stress on the tendon from a sudden increase in intensity, frequency or duration of training.  Direct trauma to the tendon.  A cut (laceration) of the tendon. RISK INCREASES WITH:  Activities that involve repetitive movements and/or straightening of the elbow or extension of the shoulder (weightlifting or push-ups).  Poor strength and flexibility.  The use of steroids.  Previous use of corticosteroid injections.  Incomplete treatment of triceps tendinitis.  Previous triceps tendon injury. PREVENTION  Warm up and stretch properly before activity.  Allow for adequate recovery between workouts.  Maintain physical fitness:  Strength, flexibility, and endurance.  Cardiovascular fitness.  Learn and use proper technique, especially regarding training program design. When possible, have coach correct improper technique. PROGNOSIS  If treated properly, the symptoms of a ruptured triceps tendon usually resolve within 6 to 9 months, after which return to sports is allowed. RELATED  COMPLICATIONS   Permanent elbow weakness.  Re-rupture of the tendon after treatment.  Prolonged disability.  Risks of surgery: infection, bleeding, nerve damage or damage to surrounding tissues. TREATMENT Treatment initially involves resting from any activities that aggravate the symptoms, and the use of ice and medications to help reduce pain and inflammation. A triceps tendon rupture will not heal if left untreated. Definitive treatment requires surgery to reattach the tendon. After surgery the elbow and shoulder must be immobilized to allow for healing. After immobilization it is important to perform strengthening and stretching exercises to help regain strength and a full range of motion. These exercises may be completed at home or with a therapist. MEDICATION  If pain medication is necessary, then nonsteroidal anti-inflammatory medications, such as aspirin and ibuprofen, or other minor pain relievers, such as acetaminophen, are often recommended.  Do not take pain medication within 7 days before surgery.  Prescription pain relievers may be given if deemed necessary by your caregiver. Use only as directed and only as much as you need. COLD THERAPY  Cold treatment (icing) relieves pain and reduces inflammation. Cold treatment should be applied for 10 to 15 minutes every 2 to 3 hours for inflammation and pain and immediately after any activity that aggravates your symptoms. Use ice packs or massage the area with a piece of ice (ice massage). SEEK MEDICAL CARE IF:  Treatment seems to offer no benefit, or the condition worsens.  Any medications produce adverse side effects.  Any complications from surgery occur including:  Pain, numbness, or coldness in the extremity operated upon.  Discoloration of the nail beds (they become blue or gray) of the extremity operated upon.  Signs of infections (fever, pain, inflammation, redness, or  persistent bleeding).  Document Released: 06/20/2005  Document Revised: 09/12/2011 Document Reviewed: 10/02/2008 [facility] Patient Information 2014 [place], [place].
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
## 2013-11-27 NOTE — ED Provider Notes (Signed)
Medical screening examination/treatment/procedure(s) were conducted as a shared visit with non-physician practitioner(s) and myself.  I personally evaluated the patient during the encounter.   EKG Interpretation None     Palpable defect in right distal triceps tendon with spasm and tenderness of triceps muscle consistent with triceps rupture.   Wynetta Fines, MD 11/27/13 1103
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
## 2014-04-14 ENCOUNTER — Encounter: Payer: Self-pay | Admitting: Family Medicine

## 2014-04-14 ENCOUNTER — Ambulatory Visit (INDEPENDENT_AMBULATORY_CARE_PROVIDER_SITE_OTHER): Payer: BC Managed Care – PPO | Admitting: Family Medicine

## 2014-04-14 VITALS — BP 130/80 | HR 116 | Temp 98.2°F | Wt 312.0 lb

## 2014-04-14 DIAGNOSIS — R894 Abnormal immunological findings in specimens from other organs, systems and tissues: Secondary | ICD-10-CM

## 2014-04-14 DIAGNOSIS — R972 Elevated prostate specific antigen [PSA]: Secondary | ICD-10-CM

## 2014-04-14 DIAGNOSIS — N390 Urinary tract infection, site not specified: Secondary | ICD-10-CM

## 2014-04-14 DIAGNOSIS — R7301 Impaired fasting glucose: Secondary | ICD-10-CM

## 2014-04-14 DIAGNOSIS — I10 Essential (primary) hypertension: Secondary | ICD-10-CM

## 2014-04-14 DIAGNOSIS — R350 Frequency of micturition: Secondary | ICD-10-CM

## 2014-04-14 DIAGNOSIS — R768 Other specified abnormal immunological findings in serum: Secondary | ICD-10-CM

## 2014-04-14 DIAGNOSIS — Z6841 Body Mass Index (BMI) 40.0 and over, adult: Secondary | ICD-10-CM

## 2014-04-14 LAB — POCT URINALYSIS DIPSTICK
BILIRUBIN UA: NEGATIVE
Blood, UA: NEGATIVE
Glucose, UA: NEGATIVE
Ketones, UA: NEGATIVE
Leukocytes, UA: NEGATIVE
NITRITE UA: POSITIVE
PH UA: 5
Protein, UA: POSITIVE
Spec Grav, UA: 1.015
Urobilinogen, UA: NEGATIVE

## 2014-04-14 MED ORDER — LISINOPRIL-HYDROCHLOROTHIAZIDE 20-25 MG PO TABS: 1.0000 | ORAL_TABLET | Freq: Every day | ORAL | Status: DC

## 2014-04-14 MED ORDER — CIPROFLOXACIN HCL 500 MG PO TABS: 500.0000 mg | ORAL_TABLET | Freq: Two times a day (BID) | ORAL | Status: DC

## 2014-04-14 NOTE — Progress Notes (Signed)
   Subjective:    Patient ID: Devon Cook, male    DOB: 11-03-1960, 53 y.o.   MRN: [id number]  HPI He has a three-month history of frequency and dysuria. No discharge, fever or chills. 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 Review his record also indicates previous history of UTI. He also has a previous history of impaired fasting glucose. He was diagnosed with hepatitis C in 2003 but there has not been any genotyping done. He also has an elevated PSA   Review of Systems     Objective:   Physical Exam Alert and in no distress. Abdominal exam shows no masses or tenderness. Hemoglobin A1c is 5.8. Urine dipstick was positive for nitrites.       Assessment & Plan:  Frequent urination - Plan: POCT Urinalysis Dipstick  Impaired fasting glucose  Morbid obesity with BMI of 40.0-44.9, adult  Hepatitis C antibody test positive - Plan: Hepatitis C Genotype  Elevated PSA  UTI (lower urinary tract infection) - Plan: CULTURE, URINE COMPREHENSIVE, ciprofloxacin (CIPRO) 500 MG tablet  Essential hypertension - Plan: lisinopril-hydrochlorothiazide (PRINZIDE,ZESTORETIC) 20-25 MG per tablet  he was placed on Cipro. He will keep me informed concerning his symptoms. I will not check his PSA due to the UTI and possible prostate involvement. Probable referral to hepatitis C clinic pending genotype results. Recommend followup complete examination the

## 2014-04-17 LAB — HEPATITIS C RNA QUANTITATIVE
HCV QUANT LOG: 5.47 {Log} — AB (ref <1.18)
HCV Quantitative: 295598 IU/mL — ABNORMAL HIGH (ref <15)

## 2014-04-17 LAB — CULTURE, URINE COMPREHENSIVE

## 2014-04-21 ENCOUNTER — Other Ambulatory Visit: Payer: Self-pay

## 2014-04-21 LAB — HEPATITIS C GENOTYPE

## 2014-04-28 ENCOUNTER — Other Ambulatory Visit: Payer: Self-pay

## 2014-10-06 ENCOUNTER — Ambulatory Visit: Payer: Self-pay | Admitting: Medical

## 2014-10-13 ENCOUNTER — Encounter: Payer: Self-pay | Admitting: Medical

## 2015-05-13 ENCOUNTER — Telehealth: Payer: Self-pay | Admitting: Family Medicine

## 2015-05-13 DIAGNOSIS — I10 Essential (primary) hypertension: Secondary | ICD-10-CM

## 2015-05-13 MED ORDER — LISINOPRIL-HYDROCHLOROTHIAZIDE 20-25 MG PO TABS: 1.0000 | ORAL_TABLET | Freq: Every day | ORAL | Status: DC

## 2015-05-13 NOTE — Telephone Encounter (Signed)
Done for 30 days  

## 2015-05-13 NOTE — Telephone Encounter (Signed)
ALERT PT HAS A DIFFERENT PHARMACY. Pt called and scheduled a medcheck appt for December. He needs a refill on Lisinopril HCTZ. 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 Please send into [facility] in [place]. The number is [phone] and pt can be reached at [phone].

## 2015-06-09 ENCOUNTER — Encounter: Payer: Self-pay | Admitting: Family Medicine

## 2015-06-24 ENCOUNTER — Encounter: Payer: Self-pay | Admitting: Family Medicine

## 2015-06-26 ENCOUNTER — Encounter: Payer: Self-pay | Admitting: Family Medicine

## 2015-07-03 ENCOUNTER — Telehealth: Payer: Self-pay | Admitting: Family Medicine

## 2015-07-03 DIAGNOSIS — I10 Essential (primary) hypertension: Secondary | ICD-10-CM

## 2015-07-03 MED ORDER — LISINOPRIL-HYDROCHLOROTHIAZIDE 20-25 MG PO TABS: 1.0000 | ORAL_TABLET | Freq: Every day | ORAL | Status: DC

## 2015-07-03 NOTE — Telephone Encounter (Signed)
Made pt medcheck appt for jan 9th

## 2015-07-03 NOTE — Telephone Encounter (Signed)
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 Let pt know that we are closing at noon today and are closed on Monday. Pt can be reached at [phone] and pt uses [id number] [address] [place] [address] [place]

## 2015-07-05 DIAGNOSIS — Z8619 Personal history of other infectious and parasitic diseases: Secondary | ICD-10-CM

## 2015-07-05 HISTORY — DX: Personal history of other infectious and parasitic diseases: Z86.19

## 2015-07-13 ENCOUNTER — Encounter: Payer: Self-pay | Admitting: Family Medicine

## 2015-08-12 ENCOUNTER — Other Ambulatory Visit: Payer: Self-pay | Admitting: *Deleted

## 2015-08-12 ENCOUNTER — Telehealth: Payer: Self-pay | Admitting: Family Medicine

## 2015-08-12 DIAGNOSIS — I10 Essential (primary) hypertension: Secondary | ICD-10-CM

## 2015-08-12 MED ORDER — LISINOPRIL-HYDROCHLOROTHIAZIDE 20-25 MG PO TABS: 1.0000 | ORAL_TABLET | Freq: Every day | ORAL | Status: DC

## 2015-08-12 NOTE — Telephone Encounter (Signed)
Pt scheduled a cpe with JCL first of March. Needs BP pills until visit. Pt uses [facility] on [address].

## 2015-09-07 ENCOUNTER — Encounter: Payer: Self-pay | Admitting: Family Medicine

## 2015-09-07 ENCOUNTER — Ambulatory Visit (INDEPENDENT_AMBULATORY_CARE_PROVIDER_SITE_OTHER): Payer: Managed Care, Other (non HMO) | Admitting: Family Medicine

## 2015-09-07 VITALS — BP 130/90 | HR 71 | Wt 320.0 lb

## 2015-09-07 DIAGNOSIS — R768 Other specified abnormal immunological findings in serum: Secondary | ICD-10-CM

## 2015-09-07 DIAGNOSIS — R7301 Impaired fasting glucose: Secondary | ICD-10-CM | POA: Diagnosis not present

## 2015-09-07 DIAGNOSIS — G5603 Carpal tunnel syndrome, bilateral upper limbs: Secondary | ICD-10-CM | POA: Diagnosis not present

## 2015-09-07 DIAGNOSIS — R972 Elevated prostate specific antigen [PSA]: Secondary | ICD-10-CM

## 2015-09-07 DIAGNOSIS — Z0000 Encounter for general adult medical examination without abnormal findings: Secondary | ICD-10-CM

## 2015-09-07 DIAGNOSIS — Z6841 Body Mass Index (BMI) 40.0 and over, adult: Secondary | ICD-10-CM

## 2015-09-07 DIAGNOSIS — R894 Abnormal immunological findings in specimens from other organs, systems and tissues: Secondary | ICD-10-CM

## 2015-09-07 DIAGNOSIS — J301 Allergic rhinitis due to pollen: Secondary | ICD-10-CM

## 2015-09-07 DIAGNOSIS — N529 Male erectile dysfunction, unspecified: Secondary | ICD-10-CM

## 2015-09-07 DIAGNOSIS — M25512 Pain in left shoulder: Secondary | ICD-10-CM

## 2015-09-07 DIAGNOSIS — I10 Essential (primary) hypertension: Secondary | ICD-10-CM | POA: Diagnosis not present

## 2015-09-07 DIAGNOSIS — M25511 Pain in right shoulder: Secondary | ICD-10-CM | POA: Diagnosis not present

## 2015-09-07 LAB — POCT URINALYSIS DIPSTICK
Bilirubin, UA: NEGATIVE
Blood, UA: NEGATIVE
GLUCOSE UA: NEGATIVE
Ketones, UA: NEGATIVE
LEUKOCYTES UA: NEGATIVE
NITRITE UA: NEGATIVE
PH UA: 6
Spec Grav, UA: >=1.03
Urobilinogen, UA: 4

## 2015-09-07 LAB — CBC WITH DIFFERENTIAL/PLATELET
BASOS PCT: 0 % (ref 0–1)
Basophils Absolute: 0 10*3/uL (ref 0.0–0.1)
EOS ABS: 0.1 10*3/uL (ref 0.0–0.7)
Eosinophils Relative: 1 % (ref 0–5)
HCT: 45.8 % (ref 39.0–52.0)
Hemoglobin: 15.6 g/dL (ref 13.0–17.0)
LYMPHS ABS: 2.5 10*3/uL (ref 0.7–4.0)
Lymphocytes Relative: 25 % (ref 12–46)
MCH: 26.4 pg (ref 26.0–34.0)
MCHC: 34.1 g/dL (ref 30.0–36.0)
MCV: 77.4 fL — AB (ref 78.0–100.0)
MPV: 10 fL (ref 8.6–12.4)
Monocytes Absolute: 1.4 10*3/uL — ABNORMAL HIGH (ref 0.1–1.0)
Monocytes Relative: 14 % — ABNORMAL HIGH (ref 3–12)
Neutro Abs: 6.1 10*3/uL (ref 1.7–7.7)
Neutrophils Relative %: 60 % (ref 43–77)
Platelets: 165 10*3/uL (ref 150–400)
RBC: 5.92 MIL/uL — ABNORMAL HIGH (ref 4.22–5.81)
RDW: 14.7 % (ref 11.5–15.5)
WBC: 10.1 10*3/uL (ref 4.0–10.5)

## 2015-09-07 MED ORDER — LISINOPRIL-HYDROCHLOROTHIAZIDE 20-25 MG PO TABS: 1.0000 | ORAL_TABLET | Freq: Every day | ORAL | Status: DC

## 2015-09-07 NOTE — Progress Notes (Signed)
Subjective:    Patient ID: Devon Cook, male    DOB: 06/27/1961, 55 y.o.   MRN: [id number]  HPI He is here for a complete examination.complains of bilateral hand tingling sensation and points to the thumb index and third finger. Apparently this is pretty constant but he does state that certain ADLs make this worse. He has not tried any medications for this. He also complains of bilateral shoulder aching especially when he lies on the shoulders. He can play golf and do his daily activities without pain numbness or tingling. When he sits in the proper position, the shoulders do not Hurst. He does have underlying allergies and uses nasal spray on an as-needed basis. He does have ED but presently is not using any medication for this. Expresses desire to lose weight. He also would like to get a colonoscopy. He does have a history of impaired fasting glucose. Review of the record also indicates slightly elevated PSA that he has not gotten follow-up 1. He has a history of hepatitis C and is interested in getting therapy for this. He continues on his blood pressure medications. His work and home life are going quite well. Family social history, immunizations and health maintenance were reviewed.   Review of Systems  All other systems reviewed and are negative.      Objective:   Physical Exam BP 130/90 mmHg  Pulse 71  Wt 320 lb (145.151 kg)  SpO2 98%  General Appearance:    Alert, cooperative, no distress, appears stated age  Head:    Normocephalic, without obvious abnormality, atraumatic  Eyes:    PERRL, conjunctiva/corneas clear, EOM's intact, fundi    benign  Ears:    Normal TM's and external ear canals  Nose:   Nares normal, mucosa normal, no drainage or sinus   tenderness  Throat:   Lips, mucosa, and tongue normal; teeth and gums normal  Neck:   Supple, no lymphadenopathy;  thyroid:  no   enlargement/tenderness/nodules; no carotid   bruit or JVD  Back:    Spine nontender, no  curvature, ROM normal, no CVA     tenderness  Lungs:     Clear to auscultation bilaterally without wheezes, rales or     ronchi; respirations unlabored  Chest Wall:    No tenderness or deformity   Heart:    Regular rate and rhythm, S1 and S2 normal, no murmur, rub   or gallop     Abdomen:     Soft, non-tender, nondistended, normoactive bowel sounds,    no masses, no hepatosplenomegaly        Extremities:   No clubbing, cyanosis or edema.exam of the wrists shows normal strength and negative Tinel and Phalen's test.  Pulses:   2+ and symmetric all extremities  Skin:   Skin color, texture, turgor normal, no rashes or lesions  Lymph nodes:   Cervical, supraclavicular, and axillary nodes normal  Neurologic:   CNII-XII intact, normal strength, sensation and gait; reflexes 2+ and symmetric throughout          Psych:   Normal mood, affect, hygiene and grooming.          Assessment & Plan:  Physical exam, annual - Plan: POCT urinalysis dipstick, CBC with Differential/Platelet, Comprehensive metabolic panel, Lipid panel  Morbid obesity with BMI of 40.0-44.9, adult (HCC)  Impaired fasting glucose - Plan: CBC with Differential/Platelet, Comprehensive metabolic panel, Lipid panel  Essential hypertension - Plan: CBC with Differential/Platelet, Comprehensive metabolic panel, Lipid panel,  lisinopril-hydrochlorothiazide (PRINZIDE,ZESTORETIC) 20-25 MG tablet  Erectile dysfunction, unspecified erectile dysfunction type  Hepatitis C antibody test positive - Plan: AMB referral to hepatitis C clinic, Hepatitis C RNA quantitative, CANCELED: AMB referral to hepatitis C clinic  Elevated PSA - Plan: PSA  Bilateral carpal tunnel syndrome - Plan: Nerve conduction test, CANCELED: Nerve conduction test  Pain of both shoulder joints - Plan: Ambulatory referral to Physical Therapy, CANCELED: Ambulatory referral to Physical Therapy, CANCELED: Ambulatory referral to Physical Therapy  Allergic rhinitis due to  pollen I will do routine blood screening on him. He is to continue on his blood pressure medication. He does not want a refill on Cialis. He will be referred to the hepatitis C clinic will also refer to physical therapy for a good shoulder rehabilitation program. Nerve conduction studies and follow-up pending results of this.

## 2015-09-07 NOTE — Patient Instructions (Signed)
Call your insurance and see if it will cover Cologuard

## 2015-09-08 LAB — LIPID PANEL
CHOL/HDL RATIO: 2.8 ratio (ref <=5.0)
Cholesterol: 115 mg/dL — ABNORMAL LOW (ref 125–200)
HDL: 41 mg/dL (ref >=40)
LDL CALC: 55 mg/dL (ref <130)
Triglycerides: 95 mg/dL (ref <150)
VLDL: 19 mg/dL (ref <30)

## 2015-09-08 LAB — PSA: PSA: 13.59 ng/mL — ABNORMAL HIGH (ref <=4.00)

## 2015-09-08 LAB — COMPREHENSIVE METABOLIC PANEL
ALT: 98 U/L — ABNORMAL HIGH (ref 9–46)
AST: 69 U/L — ABNORMAL HIGH (ref 10–35)
Albumin: 3.4 g/dL — ABNORMAL LOW (ref 3.6–5.1)
Alkaline Phosphatase: 150 U/L — ABNORMAL HIGH (ref 40–115)
BUN: 17 mg/dL (ref 7–25)
CHLORIDE: 102 mmol/L (ref 98–110)
CO2: 25 mmol/L (ref 20–31)
Calcium: 9.1 mg/dL (ref 8.6–10.3)
Creat: 0.87 mg/dL (ref 0.70–1.33)
Glucose, Bld: 87 mg/dL (ref 65–99)
POTASSIUM: 3.7 mmol/L (ref 3.5–5.3)
Sodium: 136 mmol/L (ref 135–146)
Total Bilirubin: 0.5 mg/dL (ref 0.2–1.2)
Total Protein: 7.9 g/dL (ref 6.1–8.1)

## 2015-09-08 NOTE — Progress Notes (Signed)
   Subjective:    Patient ID: Devon Cook, male    DOB: Nov 13, 1960, 55 y.o.   MRN: [id number]  HPI    Review of Systems     Objective:   Physical Exam        Assessment & Plan:  He states that he had sex prior to coming in for his exam which could be causing his elevated PSA. I will place a future order for repeat PSA

## 2015-09-08 NOTE — Addendum Note (Signed)
Addended by: [name] on: 09/08/2015 09:10 AM   Modules accepted: Orders

## 2015-09-21 ENCOUNTER — Encounter: Payer: Self-pay | Admitting: Internal Medicine

## 2015-09-22 NOTE — Progress Notes (Signed)
Let him know that the lab lost his test for the hepatitis C. Have him come in for a redraw

## 2015-10-08 ENCOUNTER — Encounter: Payer: Self-pay | Admitting: Internal Medicine

## 2015-10-13 ENCOUNTER — Other Ambulatory Visit: Payer: Self-pay

## 2015-10-13 DIAGNOSIS — R768 Other specified abnormal immunological findings in serum: Secondary | ICD-10-CM

## 2015-10-20 ENCOUNTER — Other Ambulatory Visit: Payer: Managed Care, Other (non HMO)

## 2015-10-20 DIAGNOSIS — B182 Chronic viral hepatitis C: Secondary | ICD-10-CM

## 2015-10-20 LAB — PROTIME-INR
INR: 1.1 (ref <1.50)
PROTHROMBIN TIME: 14.3 s (ref 11.6–15.2)

## 2015-10-21 LAB — ANTI-NUCLEAR AB-TITER (ANA TITER)

## 2015-10-21 LAB — HEPATITIS A ANTIBODY, TOTAL: Hep A Total Ab: NONREACTIVE

## 2015-10-21 LAB — HIV ANTIBODY (ROUTINE TESTING W REFLEX): HIV: NONREACTIVE

## 2015-10-21 LAB — HEPATITIS B SURFACE ANTIBODY,QUALITATIVE: HEP B S AB: NEGATIVE

## 2015-10-21 LAB — ANA: ANA: POSITIVE — AB

## 2015-10-21 LAB — HEPATITIS B SURFACE ANTIGEN: Hepatitis B Surface Ag: NEGATIVE

## 2015-10-21 LAB — HEPATITIS B CORE ANTIBODY, TOTAL: HEP B C TOTAL AB: REACTIVE — AB

## 2015-10-21 LAB — IRON: Iron: 191 ug/dL — ABNORMAL HIGH (ref 50–180)

## 2015-10-23 LAB — HCV RNA,LIPA RFLX NS5A DRUG RESIST

## 2015-11-04 ENCOUNTER — Encounter: Payer: Self-pay | Admitting: Internal Medicine

## 2015-11-18 ENCOUNTER — Encounter (INDEPENDENT_AMBULATORY_CARE_PROVIDER_SITE_OTHER): Payer: Self-pay

## 2015-11-18 ENCOUNTER — Encounter: Payer: Self-pay | Admitting: Internal Medicine

## 2015-11-18 ENCOUNTER — Ambulatory Visit (INDEPENDENT_AMBULATORY_CARE_PROVIDER_SITE_OTHER): Payer: Managed Care, Other (non HMO) | Admitting: Internal Medicine

## 2015-11-18 VITALS — BP 168/113 | HR 92 | Temp 98.4°F | Wt 324.0 lb

## 2015-11-18 DIAGNOSIS — B182 Chronic viral hepatitis C: Secondary | ICD-10-CM

## 2015-11-18 MED ORDER — LEDIPASVIR-SOFOSBUVIR 90-400 MG PO TABS: 1.0000 | ORAL_TABLET | Freq: Every day | ORAL | Status: DC

## 2015-11-18 NOTE — Patient Instructions (Signed)
Date 11/18/2015  Dear Devon Cook, As discussed in the [facility], your hepatitis C therapy will include the following medications:          Harvoni 90mg /400mg  tablet:           Take 1 tablet by mouth once daily   Please note that ALL MEDICATIONS WILL START ON THE SAME DATE for a total of 12 weeks. ---------------------------------------------------------------- Your HCV Treatment Start Date: TBA   Your HCV genotype:  1b    Liver Fibrosis: TBD    ---------------------------------------------------------------- YOUR PHARMACY CONTACT:   [place] [facility] [place] Phone: [phone] Hours: Monday to Friday 7:30 am to 6:00 pm   Please always contact your pharmacy at least 3-4 business days before you run out of medications to ensure your next month's medication is ready or 1 week prior to running out if you receive it by mail.  Remember, each prescription is for 28 days. ---------------------------------------------------------------- GENERAL NOTES REGARDING YOUR HEPATITIS C MEDICATION:  SOFOSBUVIR/LEDIPASVIR (HARVONI): - Harvoni tablet is taken daily with OR without food. - The tablets are orange. - The tablets should be stored at room temperature.  - Acid reducing agents such as H2 blockers (ie. Pepcid (famotidine), Zantac (ranitidine), Tagamet (cimetidine), Axid (nizatidine) and proton pump inhibitors (ie. Prilosec (omeprazole), Protonix (pantoprazole), Nexium (esomeprazole), or Aciphex (rabeprazole)) can decrease effectiveness of Harvoni. Do not take until you have discussed with a health care provider.    -Antacids that contain magnesium and/or aluminum hydroxide (ie. Milk of Magensia, Rolaids, Gaviscon, Maalox, Mylanta, an dArthritis Pain Formula)can reduce absorption of Harvoni, so take them at least 4 hours before or after Harvoni.  -Calcium carbonate (calcium supplements or antacids such as Tums, Caltrate,  Os-Cal)needs to be taken at least 4 hours hours before or after Harvoni.  -St. John's wort or any products that contain St. John's wort like some herbal supplements  Please inform the office prior to starting any of these medications.  - The common side effects associated with Harvoni include:      1. Fatigue      2. Headache      3. Nausea      4. Diarrhea      5. Insomnia  Please note that this only lists the most common side effects and is NOT a comprehensive list of the potential side effects of these medications. For more information, please review the drug information sheets that come with your medication package from the pharmacy.  ---------------------------------------------------------------- GENERAL HELPFUL HINTS ON HCV THERAPY: 1. Stay well-hydrated. 2. Notify the [facility] of any changes in your other over-the-counter/herbal or prescription medications. 3. If you miss a dose of your medication, take the missed dose as soon as you remember. Return to your regular time/dose schedule the next day.  4.  Do not stop taking your medications without first talking with your healthcare provider. 5.  You may take Tylenol (acetaminophen), as long as the dose is less than 2000 mg (OR no more than 4 tablets of the Tylenol Extra Strengths 500mg  tablet) in 24 hours. 6.  You will see our pharmacist-specialist within the first 2 weeks of starting your medication. 7.  You will need to obtain routine labs around week 4 and12 weeks after starting and then 3 to 6 months after finishing Harvoni.    Devon Cook, [place] for [place], [place]  [id number] [phone] 

## 2015-11-18 NOTE — Progress Notes (Signed)
[facility]   CC: consideration for treatment for chronic hepatitis C  HPI:  +Devon Cook is a 55 y.o. male who presents for initial evaluation and management of chronic hepatitis C.  Patient tested positive earlier this year during routine screening. Hepatitis C-associated risk factors present are: tattoos. Patient denies history of blood transfusion, intranasal drug use, IV drug abuse, multiple sexual partners, renal dialysis, sexual contact with person with liver disease. Patient has had other studies performed. Results: hepatitis C RNA by PCR, result: positive. Patient has not had prior treatment for Hepatitis C. Patient does not have a past history of liver disease. Patient does not have a family history of liver disease. Patient does not  have associated signs or symptoms related to liver disease.  Labs reviewed and confirm chronic hepatitis C with a positive viral load.   Records reviewed from PCP,  Has hypertension.      Patient does not have documented immunity to Hepatitis A. Patient does have documented immunity to Hepatitis B.    Review of Systems:   Constitutional: negative for fatigue and malaise Cardiovascular: negative for dyspnea Gastrointestinal: negative for diarrhea Musculoskeletal: negative for myalgias and arthralgias All other systems reviewed and are negative      Past Medical History  Diagnosis Date  . Impaired fasting glucose 10/2004  . Obesity   . Hypertension 10/2004  . Herpes genitalis in men   . ED (erectile dysfunction)   . Allergy     Prior to Admission medications   Medication Sig Start Date End Date Taking? Authorizing Provider  lisinopril-hydrochlorothiazide (PRINZIDE,ZESTORETIC) 20-25 MG tablet Take 1 tablet by mouth daily. 09/07/15  Yes [name], MD  mometasone (NASONEX) 50 MCG/ACT nasal spray Place 1 spray into both nostrils daily.   Yes Historical Provider, MD  Multiple Vitamins-Minerals (MULTIVITAMIN  WITH MINERALS) tablet Take 1 tablet by mouth daily.     Yes Historical Provider, MD  Ledipasvir-Sofosbuvir (HARVONI) 90-400 MG TABS Take 1 tablet by mouth daily. 11/18/15   Thayer Headings, MD  tadalafil (CIALIS) 20 MG tablet Take 0.5-1 tablets (10-20 mg total) by mouth every other day as needed for erectile dysfunction. 01/09/12 04/14/14  [name], MD    No Known Allergies  Social History  Substance Use Topics  . Smoking status: Current Some Day Smoker  . Smokeless tobacco: Never Used  . Alcohol Use: No     Comment: rarely    FMHx: no liver disease, no liver cancer   Objective:  Constitutional: in no apparent distress and alert,  Filed Vitals:   11/18/15 1502  BP: 168/113  Pulse: 92  Temp: 98.4 F (36.9 C)   Eyes: anicteric Cardiovascular: Cor RRR Respiratory: CTA B; normal respiratory effort Gastrointestinal: Bowel sounds are normal, liver is not enlarged, spleen is not enlarged Musculoskeletal: peripheral pulses normal, no pedal edema, no clubbing or cyanosis Skin: negative for - jaundice, spider hemangioma, telangiectasia, palmar erythema, ecchymosis and atrophy; no porphyria cutanea tarda Lymphatic: no cervical lymphadenopathy   Laboratory Genotype:  Lab Results  Component Value Date   HCVGENOTYPE 1b 04/14/2014   HCV viral load:  Lab Results  Component Value Date   HCVQUANT [id number]* 04/14/2014   Lab Results  Component Value Date   WBC 10.1 09/07/2015   HGB 15.6 09/07/2015   HCT 45.8 09/07/2015   MCV 77.4* 09/07/2015   PLT 165 09/07/2015    Lab Results  Component Value Date   CREATININE 0.87 09/07/2015  BUN 17 09/07/2015   NA 136 09/07/2015   K 3.7 09/07/2015   CL 102 09/07/2015   CO2 25 09/07/2015    Lab Results  Component Value Date   ALT 98* 09/07/2015   AST 69* 09/07/2015   ALKPHOS 150* 09/07/2015     Labs and history reviewed and show CHILD-PUGH A  5-6 points: Child class A 7-9 points: Child class B 10-15 points: Child class  C  Lab Results  Component Value Date   INR 1.10 10/20/2015   BILITOT 0.5 09/07/2015   ALBUMIN 3.4* 09/07/2015     Assessment: New Patient with Chronic Hepatitis C genotype 1b, untreated.  I discussed with the patient the lab findings that confirm chronic hepatitis C as well as the natural history and progression of disease including about 30% of people who develop cirrhosis of the liver if left untreated and once cirrhosis is established there is a 2-7% risk per year of liver cancer and liver failure.  I discussed the importance of treatment and benefits in reducing the risk, even if significant liver fibrosis exists.   Plan: 1) Patient counseled extensively on limiting acetaminophen to no more than 2 grams daily, avoidance of alcohol. 2) Transmission discussed with patient including sexual transmission, sharing razors and toothbrush.   3) Will need referral to gastroenterology if concern for cirrhosis 4) Will need referral for substance abuse counseling: No.; Further work up to include urine drug screen  No. 5) Will prescribe Harvoni for 12 weeks 6) Hepatitis A vaccine Yes.    Will start series next visit.  7) Hepatitis B vaccine No. 8) Pneumovax vaccine if concern for cirrhosis 9) Further work up to include liver staging with elastography 10) will follow up after starting medication

## 2016-03-23 ENCOUNTER — Telehealth: Payer: Self-pay | Admitting: Family Medicine

## 2016-03-23 ENCOUNTER — Other Ambulatory Visit: Payer: Self-pay | Admitting: Pharmacist

## 2016-03-23 DIAGNOSIS — B182 Chronic viral hepatitis C: Secondary | ICD-10-CM

## 2016-03-23 NOTE — Telephone Encounter (Addendum)
Pt said his allergies has been causing him problems since Sunday night. Has runny nose, ears & throat itchy, cough with yellowish phlegm. Does he need to be seen or what can he take at home?Call pt at [phone]

## 2016-03-23 NOTE — Telephone Encounter (Signed)
Called pt and gave him Shane's instructions. Pt will try Zyrtec first and make an appointment if it does not work.

## 2016-03-23 NOTE — Telephone Encounter (Signed)
Can come in or can try OTC zyrtec first

## 2016-03-29 ENCOUNTER — Other Ambulatory Visit: Payer: Self-pay

## 2016-04-04 ENCOUNTER — Other Ambulatory Visit: Payer: Managed Care, Other (non HMO)

## 2016-04-04 DIAGNOSIS — B182 Chronic viral hepatitis C: Secondary | ICD-10-CM

## 2016-04-07 LAB — LIVER FIBROSIS, FIBROTEST-ACTITEST
ALPHA-2-MACROGLOBULIN: 336 mg/dL — AB (ref 106–279)
ALT: 80 U/L — AB (ref 9–46)
Apolipoprotein A1: 147 mg/dL (ref 94–176)
Bilirubin: 0.4 mg/dL (ref 0.2–1.2)
FIBROSIS SCORE: 0.55
GGT: 97 U/L — AB (ref 3–95)
HAPTOGLOBIN: 135 mg/dL (ref 43–212)
NECROINFLAMMAT ACT SCORE: 0.57
Reference ID: 1647560

## 2016-04-18 ENCOUNTER — Other Ambulatory Visit: Payer: Self-pay | Admitting: Pharmacist

## 2016-04-18 DIAGNOSIS — B182 Chronic viral hepatitis C: Secondary | ICD-10-CM

## 2016-04-20 ENCOUNTER — Other Ambulatory Visit: Payer: Self-pay

## 2016-04-27 ENCOUNTER — Ambulatory Visit: Payer: Managed Care, Other (non HMO) | Admitting: Family Medicine

## 2016-05-02 ENCOUNTER — Other Ambulatory Visit: Payer: Self-pay

## 2016-05-03 ENCOUNTER — Other Ambulatory Visit: Payer: Self-pay

## 2016-05-04 ENCOUNTER — Other Ambulatory Visit: Payer: Self-pay

## 2016-05-06 LAB — HEPATITIS C RNA QUANTITATIVE
HCV QUANT LOG: 5.23 {Log} — AB (ref <1.18)
HCV QUANT: 170370 [IU]/mL — AB (ref <15)

## 2016-05-11 MED FILL — HARVONI 90-400 MG TABLET: 90-400 | 28 days supply | Qty: 28 | Fill #0

## 2016-05-16 ENCOUNTER — Encounter: Payer: Self-pay | Admitting: Pharmacy Technician

## 2016-06-06 ENCOUNTER — Ambulatory Visit: Payer: Self-pay

## 2016-08-22 ENCOUNTER — Ambulatory Visit: Payer: Managed Care, Other (non HMO)

## 2016-09-07 ENCOUNTER — Telehealth: Payer: Self-pay | Admitting: Family Medicine

## 2016-09-07 NOTE — Telephone Encounter (Signed)
I'm not comfortable calling something in. The best thing would be for him stop at an urgent care center and get checked

## 2016-09-07 NOTE — Telephone Encounter (Signed)
Pt informed and verbalized understanding

## 2016-09-07 NOTE — Telephone Encounter (Signed)
Pt called and states that he is having some inner pain in his right ear and also having a bad cough and coughing up some phlegm, and having some sinus pain and pressure and is causing him to have a sore throat, states the ear pain feels like he is swimming, he is on the road and is on his way to [place] and is not able to come in and is wanting to know what he can take and/or if you  send him something in you will have to call him to see where he is to see where to send him something in  Please advise pt can be reached at [phone]

## 2016-09-12 ENCOUNTER — Ambulatory Visit (INDEPENDENT_AMBULATORY_CARE_PROVIDER_SITE_OTHER): Payer: Managed Care, Other (non HMO) | Admitting: Family Medicine

## 2016-09-12 ENCOUNTER — Encounter: Payer: Self-pay | Admitting: Family Medicine

## 2016-09-12 VITALS — BP 164/110 | HR 60 | Temp 98.8°F | Ht 74.0 in | Wt 327.4 lb

## 2016-09-12 DIAGNOSIS — J40 Bronchitis, not specified as acute or chronic: Secondary | ICD-10-CM | POA: Diagnosis not present

## 2016-09-12 DIAGNOSIS — I10 Essential (primary) hypertension: Secondary | ICD-10-CM

## 2016-09-12 DIAGNOSIS — J329 Chronic sinusitis, unspecified: Secondary | ICD-10-CM

## 2016-09-12 DIAGNOSIS — Z6841 Body Mass Index (BMI) 40.0 and over, adult: Secondary | ICD-10-CM | POA: Diagnosis not present

## 2016-09-12 DIAGNOSIS — H6502 Acute serous otitis media, left ear: Secondary | ICD-10-CM

## 2016-09-12 MED ORDER — AMOXICILLIN 875 MG PO TABS: 875.0000 mg | ORAL_TABLET | Freq: Two times a day (BID) | ORAL | 0 refills | Status: DC

## 2016-09-12 NOTE — Progress Notes (Signed)
Chief Complaint  Patient presents with  . Ear Pain    right ear pain x 3 days. Has been having head cold syptoms 5-7 days. No fevers, mucus is clear.    He complains of right ear pain x 3 days, along with them feeling plugged.  Ears have popped, but no change in decreased hearing or pain after popping.  He has had cold symptoms x 1 week. He has residual productive cough, producing yellow phlegm.  Nose hasn't been running, has been dry, but having facial/sinus discomfort today.  Denies fevers, chills.   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  None of these helped much.  PMH, PSH, SH reviewed  Outpatient Encounter Prescriptions as of 09/12/2016  Medication Sig  . Ledipasvir-Sofosbuvir (HARVONI) 90-400 MG TABS Take 1 tablet by mouth daily.  Marland Kitchen lisinopril-hydrochlorothiazide (PRINZIDE,ZESTORETIC) 20-25 MG tablet Take 1 tablet by mouth daily.  . Multiple Vitamins-Minerals (MULTIVITAMIN WITH MINERALS) tablet Take 1 tablet by mouth daily.    . tadalafil (CIALIS) 20 MG tablet Take 0.5-1 tablets (10-20 mg total) by mouth every other day as needed for erectile dysfunction.  . [DISCONTINUED] mometasone (NASONEX) 50 MCG/ACT nasal spray Place 1 spray into both nostrils daily.   No facility-administered encounter medications on file as of 09/12/2016.    No Known Allergies   ROS:  No fever, chills, headaches or dizziness.  Sinus pressure and right ear pain.  Denies shortness of breath.  Denies nausea, vomiting, diarrhea. No rashes, bleeding, bruising.  Denies myalgias, arthralgias.  Some neck pain (chronic).   PHYSICAL EXAM:  BP (!) 170/118 (BP Location: Left Arm, Patient Position: Sitting, Cuff Size: Normal)   Pulse 60   Temp 98.8 F (37.1 C) (Tympanic)   Ht 6\' 2"  (1.88 m)   Wt (!) 327 lb 6.4 oz (148.5 kg)   BMI 42.04 kg/m   164/110 on repeat by MD  Well appearing male in no distress HEENT: PERRL, EOMI, conjunctiva  and sclera are clear. TM and EAC's normal on the left.  Right TM had some yellow fluid behind, but no erythema.  Nasal mucosa was moderately edematous with yellow drainage and crusting. Sinuses nontender. OP clear Neck: No lymphadenopathy or mass Heart: regular rate and rhythm without murmur Lungs: clear bilaterally Skin: normal turgor, no rash Neuro: alert and oriented, cranial nerves intact. Normal strength, gait   ASSESSMENT/PLAN:   Sinobronchitis - Plan: amoxicillin (AMOXIL) 875 MG tablet  Acute serous otitis media of left ear, recurrence not specified  Essential hypertension  Morbid obesity with BMI of 40.0-44.9, adult ([place])  Counseled re: diet (low sodium and healthy), exercise, need for weight loss and monitoring BP. Due for CPE with Dr. Redmond School.    Drink plenty of water. Avoid all decongestants ("sinus" meds), as these raise your blood pressure. You can use Coricidin HBP.  Check the label--if it doesn't contain guaifenesin, then taking a plain Mucinex can help loosen up the thick mucus and help with cough.  Using sinus rinses (ie Neti-pot) can help with sinus pressure. You may use tylenol as needed for pain Return in 1-2 weeks for recheck if not improving, sooner if worse (fever, worsening pain, ear drainage, complete hearing loss).  Please monitor your blood pressure elsewhere.  It was very high today--likely related to poor sleep and decongestant medications. Try and limit the sodium in your diet. Try and get regular exercise and lose weight.   You are due now  for a physical with Dr. Kalman Jewels schedule this soon.

## 2016-09-12 NOTE — Patient Instructions (Signed)
Drink plenty of water. Avoid all decongestants ("sinus" meds), as these raise your blood pressure. You can use Coricidin HBP.  Check the label--if it doesn't contain guaifenesin, then taking a plain Mucinex can help loosen up the thick mucus and help with cough.  Using sinus rinses (ie Neti-pot) can help with sinus pressure. You may use tylenol as needed for pain Return in 1-2 weeks for recheck if not improving, sooner if worse (fever, worsening pain, ear drainage, complete hearing loss).  Please monitor your blood pressure elsewhere.  It was very high today--likely related to poor sleep and decongestant medications. Try and limit the sodium in your diet. Try and get regular exercise and lose weight.   You are due now for a physical with Dr. Kalman Jewels schedule this soon.    DASH Eating Plan DASH stands for "Dietary Approaches to Stop Hypertension." The DASH eating plan is a healthy eating plan that has been shown to reduce high blood pressure (hypertension). It may also reduce your risk for type 2 diabetes, heart disease, and stroke. The DASH eating plan may also help with weight loss. What are tips for following this plan? General guidelines   Avoid eating more than 2,300 mg (milligrams) of salt (sodium) a day. If you have hypertension, you may need to reduce your sodium intake to 1,500 mg a day.  Limit alcohol intake to no more than 1 drink a day for nonpregnant women and 2 drinks a day for men. One drink equals 12 oz of beer, 5 oz of wine, or 1 oz of hard liquor.  Work with your health care provider to maintain a healthy body weight or to lose weight. Ask what an ideal weight is for you.  Get at least 30 minutes of exercise that causes your heart to beat faster (aerobic exercise) most days of the week. Activities may include walking, swimming, or biking.  Work with your health care provider or diet and nutrition specialist (dietitian) to adjust your eating plan to your  individual calorie needs. Reading food labels   Check food labels for the amount of sodium per serving. Choose foods with less than 5 percent of the Daily Value of sodium. Generally, foods with less than 300 mg of sodium per serving fit into this eating plan.  To find whole grains, look for the word "whole" as the first word in the ingredient list. Shopping   Buy products labeled as "low-sodium" or "no salt added."  Buy fresh foods. Avoid canned foods and premade or frozen meals. Cooking   Avoid adding salt when cooking. Use salt-free seasonings or herbs instead of table salt or sea salt. Check with your health care provider or pharmacist before using salt substitutes.  Do not fry foods. Cook foods using healthy methods such as baking, boiling, grilling, and broiling instead.  Cook with heart-healthy oils, such as olive, canola, soybean, or sunflower oil. Meal planning    Eat a balanced diet that includes:  5 or more servings of fruits and vegetables each day. At each meal, try to fill half of your plate with fruits and vegetables.  Up to 6-8 servings of whole grains each day.  Less than 6 oz of lean meat, poultry, or fish each day. A 3-oz serving of meat is about the same size as a deck of cards. One egg equals 1 oz.  2 servings of low-fat dairy each day.  A serving of nuts, seeds, or beans 5 times each week.  Heart-healthy fats. Healthy  fats called Omega-3 fatty acids are found in foods such as flaxseeds and coldwater fish, like sardines, salmon, and mackerel.  Limit how much you eat of the following:  Canned or prepackaged foods.  Food that is high in trans fat, such as fried foods.  Food that is high in saturated fat, such as fatty meat.  Sweets, desserts, sugary drinks, and other foods with added sugar.  Full-fat dairy products.  Do not salt foods before eating.  Try to eat at least 2 vegetarian meals each week.  Eat more home-cooked food and less restaurant,  buffet, and fast food.  When eating at a restaurant, ask that your food be prepared with less salt or no salt, if possible. What foods are recommended? The items listed may not be a complete list. Talk with your dietitian about what dietary choices are best for you. Grains  Whole-grain or whole-wheat bread. Whole-grain or whole-wheat pasta. Brown rice. [place]row. Bulgur. Whole-grain and low-sodium cereals. Pita bread. Low-fat, low-sodium crackers. Whole-wheat flour tortillas. Vegetables  Fresh or frozen vegetables (raw, steamed, roasted, or grilled). Low-sodium or reduced-sodium tomato and vegetable juice. Low-sodium or reduced-sodium tomato sauce and tomato paste. Low-sodium or reduced-sodium canned vegetables. Fruits  All fresh, dried, or frozen fruit. Canned fruit in natural juice (without added sugar). Meat and other protein foods  Skinless chicken or [place]. Ground chicken or [place]. Pork with fat trimmed off. Fish and seafood. Egg whites. Dried beans, peas, or lentils. Unsalted nuts, nut butters, and seeds. Unsalted canned beans. Lean cuts of beef with fat trimmed off. Low-sodium, lean deli meat. Dairy  Low-fat (1%) or fat-free (skim) milk. Fat-free, low-fat, or reduced-fat cheeses. Nonfat, low-sodium ricotta or cottage cheese. Low-fat or nonfat yogurt. Low-fat, low-sodium cheese. Fats and oils  Soft margarine without trans fats. Vegetable oil. Low-fat, reduced-fat, or light mayonnaise and salad dressings (reduced-sodium). Canola, safflower, olive, soybean, and sunflower oils. Avocado. Seasoning and other foods  Herbs. Spices. Seasoning mixes without salt. Unsalted popcorn and pretzels. Fat-free sweets. What foods are not recommended? The items listed may not be a complete list. Talk with your dietitian about what dietary choices are best for you. Grains  Baked goods made with fat, such as croissants, muffins, or some breads. Dry pasta or rice meal packs. Vegetables  Creamed or  fried vegetables. Vegetables in a cheese sauce. Regular canned vegetables (not low-sodium or reduced-sodium). Regular canned tomato sauce and paste (not low-sodium or reduced-sodium). Regular tomato and vegetable juice (not low-sodium or reduced-sodium). Angie Fava. Olives. Fruits  Canned fruit in a light or heavy syrup. Fried fruit. Fruit in cream or butter sauce. Meat and other protein foods  Fatty cuts of meat. Ribs. Fried meat. Berniece Salines. Sausage. Bologna and other processed lunch meats. Salami. Fatback. Hotdogs. Bratwurst. Salted nuts and seeds. Canned beans with added salt. Canned or smoked fish. Whole eggs or egg yolks. Chicken or [place] with skin. Dairy  Whole or 2% milk, cream, and half-and-half. Whole or full-fat cream cheese. Whole-fat or sweetened yogurt. Full-fat cheese. Nondairy creamers. Whipped toppings. Processed cheese and cheese spreads. Fats and oils  Butter. Stick margarine. Lard. Shortening. Ghee. Bacon fat. Tropical oils, such as coconut, palm kernel, or palm oil. Seasoning and other foods  Salted popcorn and pretzels. Onion salt, garlic salt, seasoned salt, table salt, and sea salt. Worcestershire sauce. Tartar sauce. Barbecue sauce. Teriyaki sauce. Soy sauce, including reduced-sodium. Steak sauce. Canned and packaged gravies. Fish sauce. Oyster sauce. Cocktail sauce. Horseradish that you find on the shelf. Ketchup. Mustard.  Meat flavorings and tenderizers. Bouillon cubes. Hot sauce and Tabasco sauce. Premade or packaged marinades. Premade or packaged taco seasonings. Relishes. Regular salad dressings. Where to find more information:  National Heart, Lung, and [place]: https://wilson-eaton.com/  American Heart Association: www.heart.org Summary  The DASH eating plan is a healthy eating plan that has been shown to reduce high blood pressure (hypertension). It may also reduce your risk for type 2 diabetes, heart disease, and stroke.  With the DASH eating plan, you should limit salt  (sodium) intake to 2,300 mg a day. If you have hypertension, you may need to reduce your sodium intake to 1,500 mg a day.  When on the DASH eating plan, aim to eat more fresh fruits and vegetables, whole grains, lean proteins, low-fat dairy, and heart-healthy fats.  Work with your health care provider or diet and nutrition specialist (dietitian) to adjust your eating plan to your individual calorie needs. This information is not intended to replace advice given to you by your health care provider. Make sure you discuss any questions you have with your health care provider. Document Released: 06/09/2011 Document Revised: 06/13/2016 Document Reviewed: 06/13/2016 Elsevier Interactive Patient Education  2017 [employer].

## 2016-12-02 ENCOUNTER — Telehealth: Payer: Self-pay | Admitting: Family Medicine

## 2016-12-02 ENCOUNTER — Other Ambulatory Visit: Payer: Self-pay

## 2016-12-02 DIAGNOSIS — I10 Essential (primary) hypertension: Secondary | ICD-10-CM

## 2016-12-02 MED ORDER — LISINOPRIL-HYDROCHLOROTHIAZIDE 20-25 MG PO TABS: 1.0000 | ORAL_TABLET | Freq: Every day | ORAL | 0 refills | Status: DC

## 2016-12-02 NOTE — Telephone Encounter (Signed)
He needs to be seen. Schedule him to come in here

## 2016-12-02 NOTE — Telephone Encounter (Signed)
Pt says his calf has been swollen and hurting for about 10 days now. He did not injury it. Fells like strain. Pt has been icing it  But it is not helping. 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 Requesting a referral to an ortho or some specialist. Call pt at [phone].

## 2016-12-02 NOTE — Telephone Encounter (Signed)
Pt made CPE appt for 03/13/17. Requesting refill on Lisinopril 20-25 mg

## 2016-12-02 NOTE — Telephone Encounter (Signed)
Left message word for word he needs appointment to be seen here

## 2016-12-02 NOTE — Telephone Encounter (Signed)
Sent in

## 2016-12-05 ENCOUNTER — Ambulatory Visit: Payer: Managed Care, Other (non HMO) | Admitting: Family Medicine

## 2016-12-12 ENCOUNTER — Ambulatory Visit: Payer: Managed Care, Other (non HMO) | Admitting: Family Medicine

## 2017-03-03 ENCOUNTER — Other Ambulatory Visit: Payer: Self-pay | Admitting: Medical

## 2017-03-03 DIAGNOSIS — I10 Essential (primary) hypertension: Secondary | ICD-10-CM

## 2017-03-13 ENCOUNTER — Telehealth: Payer: Self-pay

## 2017-03-13 ENCOUNTER — Encounter: Payer: Managed Care, Other (non HMO) | Admitting: Family Medicine

## 2017-03-13 NOTE — Telephone Encounter (Signed)

## 2017-03-13 NOTE — Telephone Encounter (Signed)
No follow-up. Looks like he switched to a different practitioner

## 2017-06-19 DIAGNOSIS — M25462 Effusion, left knee: Secondary | ICD-10-CM | POA: Diagnosis not present

## 2017-06-19 DIAGNOSIS — M25562 Pain in left knee: Secondary | ICD-10-CM | POA: Diagnosis not present

## 2017-06-19 DIAGNOSIS — M1712 Unilateral primary osteoarthritis, left knee: Secondary | ICD-10-CM | POA: Diagnosis not present

## 2017-07-04 DIAGNOSIS — M109 Gout, unspecified: Secondary | ICD-10-CM

## 2017-07-04 HISTORY — DX: Gout, unspecified: M10.9

## 2017-08-07 ENCOUNTER — Ambulatory Visit: Payer: Self-pay | Admitting: Family Medicine

## 2017-08-10 ENCOUNTER — Encounter: Payer: Self-pay | Admitting: Family Medicine

## 2017-09-04 ENCOUNTER — Encounter: Payer: Self-pay | Admitting: Family Medicine

## 2017-09-08 ENCOUNTER — Other Ambulatory Visit: Payer: Self-pay

## 2017-09-08 DIAGNOSIS — I10 Essential (primary) hypertension: Secondary | ICD-10-CM

## 2017-09-08 MED ORDER — LISINOPRIL-HYDROCHLOROTHIAZIDE 20-25 MG PO TABS: 1.0000 | ORAL_TABLET | Freq: Every day | ORAL | 0 refills | Status: DC

## 2017-09-25 ENCOUNTER — Ambulatory Visit: Payer: 59 | Admitting: Family Medicine

## 2017-09-25 ENCOUNTER — Encounter: Payer: Self-pay | Admitting: Family Medicine

## 2017-09-25 VITALS — BP 120/80 | HR 62 | Temp 98.0°F | Wt 301.0 lb

## 2017-09-25 DIAGNOSIS — K219 Gastro-esophageal reflux disease without esophagitis: Secondary | ICD-10-CM | POA: Diagnosis not present

## 2017-09-25 DIAGNOSIS — N410 Acute prostatitis: Secondary | ICD-10-CM | POA: Diagnosis not present

## 2017-09-25 LAB — POCT URINALYSIS DIP (PROADVANTAGE DEVICE)
BILIRUBIN UA: NEGATIVE
BILIRUBIN UA: NEGATIVE mg/dL
Glucose, UA: NEGATIVE mg/dL
Leukocytes, UA: NEGATIVE
Nitrite, UA: NEGATIVE
Protein Ur, POC: NEGATIVE mg/dL
SPECIFIC GRAVITY, URINE: 1.015
Urobilinogen, Ur: 3.5
pH, UA: 6 (ref 5.0–8.0)

## 2017-09-25 MED ORDER — SULFAMETHOXAZOLE-TRIMETHOPRIM 800-160 MG PO TABS
1.0000 | ORAL_TABLET | Freq: Two times a day (BID) | ORAL | 0 refills | Status: DC
Start: 2017-09-25 — End: 2017-10-10

## 2017-09-25 NOTE — Patient Instructions (Signed)
Take 2 Prilosec daily for the next week for your stomach and let me know how it does. Take all the prostate medication and if you are not totally back to normal when you finish call me

## 2017-09-25 NOTE — Progress Notes (Signed)
   Subjective:    Patient ID: Devon Cook, male    DOB: October 07, 1960, 57 y.o.   MRN: [id number]  HPI He is here for evaluation of a one month history of heartburn symptoms.  He complains of acid and acid feeling in the mid abdominal area.  Food makes this worse.  He has tried Pepto-Bismol which did help but he stopped taking it.  No nausea, vomiting, upper abdominal pain.  Bowel habits have been normal. He also complains of a one-month history of urinary hesitancy, decreased stream, frequency, dysuria but no fever, chills, lower abdominal pain.  No recent sexual activity.   Review of Systems     Objective:   Physical Exam Alert and in no distress.  Cardiac exam shows regular rhythm without murmurs or gallops.  Lungs are clear to auscultation.  Abdominal exam shows no masses or tenderness.  Rectal exam shows a very tender and boggy prostate.      Assessment & Plan:  Gastroesophageal reflux disease, esophagitis presence not specified  Acute prostatitis - Plan: sulfamethoxazole-trimethoprim (BACTRIM DS,SEPTRA DS) 800-160 MG tablet, Urinalysis Dipstick, CULTURE, URINE COMPREHENSIVE

## 2017-09-27 LAB — CULTURE, URINE COMPREHENSIVE

## 2017-10-10 ENCOUNTER — Ambulatory Visit (INDEPENDENT_AMBULATORY_CARE_PROVIDER_SITE_OTHER): Payer: 59 | Admitting: Family Medicine

## 2017-10-10 ENCOUNTER — Encounter: Payer: Self-pay | Admitting: Family Medicine

## 2017-10-10 VITALS — BP 142/88 | HR 57 | Temp 97.9°F | Ht 72.0 in | Wt 301.4 lb

## 2017-10-10 DIAGNOSIS — B182 Chronic viral hepatitis C: Secondary | ICD-10-CM

## 2017-10-10 DIAGNOSIS — K219 Gastro-esophageal reflux disease without esophagitis: Secondary | ICD-10-CM | POA: Diagnosis not present

## 2017-10-10 DIAGNOSIS — N410 Acute prostatitis: Secondary | ICD-10-CM | POA: Diagnosis not present

## 2017-10-10 MED ORDER — SULFAMETHOXAZOLE-TRIMETHOPRIM 800-160 MG PO TABS: 1.0000 | ORAL_TABLET | Freq: Two times a day (BID) | ORAL | 0 refills | Status: DC

## 2017-10-10 NOTE — Patient Instructions (Signed)
Back off on the Prilosec to 1 pill a day to see if it can control your symptoms We will set you up to see infectious disease concerning hepatitis C Keep track of your bloating and see if there is a pattern to it

## 2017-10-10 NOTE — Progress Notes (Signed)
   Subjective:    Patient ID: Devon Cook, male    DOB: 1961/06/20, 57 y.o.   MRN: [id number]  HPI He is here for a a recheck.  He states that his reflux symptoms are under much better control but he is still having difficulty with abdominal bloating.  He cannot relate this to any particular foods.  He also states that his prostate related symptoms have gotten much better in the last day or so. Review of the record indicates a previous history of hepatitis C however he did not follow-up mainly due to insurance issues.  He now is covered by insurance and would like to follow-up on this.  Genotype was 1 b   Review of Systems     Objective:   Physical Exam Alert and in no distress otherwise not examined       Assessment & Plan:  Gastroesophageal reflux disease, esophagitis presence not specified  Acute prostatitis - Plan: sulfamethoxazole-trimethoprim (BACTRIM DS,SEPTRA DS) 800-160 MG tablet  Chronic hepatitis C without hepatic coma (HCC) - Plan: Hepatitis c vrs RNA detect by PCR-qual, Ambulatory referral to Infectious Disease  Back off on the Prilosec to 1 pill a day to see if it can control your symptoms We will set you up to see infectious disease concerning hepatitis C Keep track of your bloating and see if there is a pattern to it If continued difficulty with the prostate symptoms, let me know.

## 2017-10-11 ENCOUNTER — Telehealth: Payer: Self-pay | Admitting: Family Medicine

## 2017-10-11 MED ORDER — CIPROFLOXACIN HCL 500 MG PO TABS: 500.0000 mg | ORAL_TABLET | Freq: Two times a day (BID) | ORAL | 0 refills | Status: DC

## 2017-10-11 NOTE — Telephone Encounter (Signed)
He complains of abdominal pain and diarrhea from the Septra.  I will therefore switch him to Cipro.

## 2017-10-11 NOTE — Telephone Encounter (Signed)
Pt made aware. KH 

## 2017-10-11 NOTE — Telephone Encounter (Signed)
Have him throw that medication away and let him know that I called a different medication in

## 2017-10-11 NOTE — Telephone Encounter (Signed)
Pt called and stated that medication he was given is causing him to have stomach pain and diarrhea. Please advise pt at [phone]. Pt uses [facility] on [address].

## 2017-10-12 LAB — HEPATITIS C VRS RNA DETECT BY PCR-QUAL: HCV RNA NAA Qualitative: NEGATIVE

## 2017-10-18 ENCOUNTER — Telehealth: Payer: Self-pay | Admitting: Family Medicine

## 2017-10-18 NOTE — Telephone Encounter (Signed)
Please call patient  He states that we referred him to a Dr ?Linus Salmons And that he got a message that the appointment was being cancelled. He states the message came form our office yesterday and he wants to confirm that is was cancelled.  He also mentioned another referral that is being done for him, That he has question about

## 2017-10-18 NOTE — Telephone Encounter (Signed)
Spoke with pt. He was notified of canceling appt with dr. Linus Salmons due to labs. Devon Cook

## 2017-10-19 LAB — SPECIMEN STATUS REPORT

## 2017-10-19 LAB — HCV RNA QUANT RFLX ULTRA OR GENOTYP: HCV QUANT BASELINE: NOT DETECTED [IU]/mL

## 2017-10-23 ENCOUNTER — Encounter: Payer: Self-pay | Admitting: Family

## 2017-11-01 DIAGNOSIS — Z8719 Personal history of other diseases of the digestive system: Secondary | ICD-10-CM

## 2017-11-01 HISTORY — DX: Personal history of other diseases of the digestive system: Z87.19

## 2017-11-13 ENCOUNTER — Ambulatory Visit (INDEPENDENT_AMBULATORY_CARE_PROVIDER_SITE_OTHER): Payer: 59 | Admitting: Family Medicine

## 2017-11-13 ENCOUNTER — Encounter: Payer: Self-pay | Admitting: Family Medicine

## 2017-11-13 VITALS — BP 164/98 | HR 70 | Temp 97.8°F | Ht 71.0 in | Wt 307.2 lb

## 2017-11-13 DIAGNOSIS — R972 Elevated prostate specific antigen [PSA]: Secondary | ICD-10-CM

## 2017-11-13 DIAGNOSIS — N400 Enlarged prostate without lower urinary tract symptoms: Secondary | ICD-10-CM | POA: Insufficient documentation

## 2017-11-13 DIAGNOSIS — Z0000 Encounter for general adult medical examination without abnormal findings: Secondary | ICD-10-CM

## 2017-11-13 DIAGNOSIS — Z8619 Personal history of other infectious and parasitic diseases: Secondary | ICD-10-CM | POA: Insufficient documentation

## 2017-11-13 DIAGNOSIS — Z6841 Body Mass Index (BMI) 40.0 and over, adult: Secondary | ICD-10-CM | POA: Diagnosis not present

## 2017-11-13 DIAGNOSIS — K219 Gastro-esophageal reflux disease without esophagitis: Secondary | ICD-10-CM

## 2017-11-13 DIAGNOSIS — R3911 Hesitancy of micturition: Secondary | ICD-10-CM | POA: Diagnosis not present

## 2017-11-13 DIAGNOSIS — N401 Enlarged prostate with lower urinary tract symptoms: Secondary | ICD-10-CM

## 2017-11-13 DIAGNOSIS — R319 Hematuria, unspecified: Secondary | ICD-10-CM

## 2017-11-13 DIAGNOSIS — R600 Localized edema: Secondary | ICD-10-CM | POA: Diagnosis not present

## 2017-11-13 DIAGNOSIS — I10 Essential (primary) hypertension: Secondary | ICD-10-CM

## 2017-11-13 DIAGNOSIS — B182 Chronic viral hepatitis C: Secondary | ICD-10-CM

## 2017-11-13 LAB — POCT URINALYSIS DIP (PROADVANTAGE DEVICE)
Bilirubin, UA: NEGATIVE
Glucose, UA: NEGATIVE mg/dL
Ketones, POC UA: NEGATIVE mg/dL
LEUKOCYTES UA: NEGATIVE
NITRITE UA: NEGATIVE
Protein Ur, POC: NEGATIVE mg/dL
Specific Gravity, Urine: 1.015
Urobilinogen, Ur: 3.5
pH, UA: 6 (ref 5.0–8.0)

## 2017-11-13 MED ORDER — LISINOPRIL-HYDROCHLOROTHIAZIDE 20-25 MG PO TABS: 1.0000 | ORAL_TABLET | Freq: Every day | ORAL | 3 refills | Status: DC

## 2017-11-13 NOTE — Patient Instructions (Signed)
is is take the Prilosec daily for the next 2 weeks to get it under control.  If it is not under control then double it.  Once are under control then try to baclofen use it every other day or even every third day and if that does not work let me know

## 2017-11-13 NOTE — Progress Notes (Addendum)
Subjective:    Patient ID: Devon Cook, male    DOB: 12-Mar-1961, 57 y.o.   MRN: [id number]  HPI He is here for complete examination.  He is still having difficulty with urinary hesitancy, decreased stream and frequency.  He has been on 2 different antibiotics for this without much success.  No fever or chills or abdominal pain.  He also complains of abdominal bloating but does state that Prilosec helps.  He is using it periodically. He also complains of right hip discomfort that is intermittent in nature.  He does occasionally take an anti-inflammatory for this.  He also has a 2-week history of swelling of the right leg.  No history of injury.  No chest pain, shortness of breath.  He is a Administrator.  Continues on his lisinopril/HCTZ.  He is also taking a multivitamin.  He has a previous history of hepatitis C and recent blood work did show negative viral load.  He has no other concerns or complaints. Family and social history as well as health maintenance and immunizations was reviewed  Review of Systems  All other systems reviewed and are negative.      Objective:   Physical Exam BP (!) 164/98 (BP Location: Left Arm, Patient Position: Sitting)   Pulse 70   Temp 97.8 F (36.6 C)   Ht 5\' 11"  (1.803 m)   Wt (!) 307 lb 3.2 oz (139.3 kg)   SpO2 99%   BMI 42.85 kg/m   General Appearance:    Alert, cooperative, no distress, appears stated age  Head:    Normocephalic, without obvious abnormality, atraumatic  Eyes:    PERRL, conjunctiva/corneas clear, EOM's intact, fundi    benign  Ears:    Normal TM's and external ear canals  Nose:   Nares normal, mucosa normal, no drainage or sinus   tenderness  Throat:   Lips, mucosa, and tongue normal; teeth and gums normal  Neck:   Supple, no lymphadenopathy;  thyroid:  no   enlargement/tenderness/nodules; no carotid   bruit or JVD     Lungs:     Clear to auscultation bilaterally without wheezes, rales or     ronchi; respirations unlabored        Heart:    Regular rate and rhythm, S1 and S2 normal, no murmur, rub   or gallop     Abdomen:     Soft, non-tender, nondistended, normoactive bowel sounds,    no masses, no hepatosplenomegaly  Genitalia:    Normal male external genitalia without lesions.  Testicles without masses.  No inguinal hernias.  Rectal:   Deferred  Extremities:   No clubbing, cyanosis or edema.  Exam of the right hip shows full motion with only slight pain with internal rotation.  Both lower extremities show edema, right greater than left.  It is not warm hot or tender.  Negative Homans sign.  Pulses:   2+ and symmetric all extremities  Skin:   Skin color, texture, turgor normal, no rashes or lesions  Lymph nodes:   Cervical, supraclavicular, and axillary nodes normal  Neurologic:   CNII-XII intact, normal strength, sensation and gait; reflexes 2+ and symmetric throughout          Psych:   Normal mood, affect, hygiene and grooming.   Urine microscopic was positive.       Assessment & Plan:  Routine general medical examination at a health care facility - Plan: CBC with Differential/Platelet, Comprehensive metabolic panel, Lipid panel  Gastroesophageal reflux disease, esophagitis presence not specified  History of hepatitis C  Essential hypertension - Plan: lisinopril-hydrochlorothiazide (PRINZIDE,ZESTORETIC) 20-25 MG tablet, CBC with Differential/Platelet, Comprehensive metabolic panel  Morbid obesity with BMI of 40.0-44.9, adult ([place]) - Plan: CBC with Differential/Platelet, Comprehensive metabolic panel, Lipid panel  Hematuria, unspecified type - Plan: Ambulatory referral to Urology  Leg edema, right - Plan: US Venous Img Lower Unilateral Right  I explained that his hip pain is not at the point where he would pursue further intervention.  Did recommend using Prilosec regularly for the next couple of weeks and then back off based on his symptoms.  Continue on his blood pressure medication. Discussed the  urinary symptoms and since he is still having some blood I will refer to urology for repeat for further treating his BPH.  09/14/17 12:25 PM.  I called and told him that he was anemic and had kidney failure.  He stated he was 200 miles away from home.  I told him to go directly to the emergency room when he gets back into town and explained to them to check the record on his blood work.

## 2017-11-13 NOTE — Addendum Note (Signed)
Addended by: Elyse Jarvis on: 11/13/2017 12:10 PM   Modules accepted: Orders

## 2017-11-14 ENCOUNTER — Inpatient Hospital Stay (HOSPITAL_COMMUNITY)
Admission: EM | Admit: 2017-11-14 | Discharge: 2017-12-02 | DRG: 683 | Disposition: A | Discharge status: Home Health | Payer: 59 | Attending: Internal Medicine | Admitting: Internal Medicine

## 2017-11-14 ENCOUNTER — Encounter (HOSPITAL_COMMUNITY): Payer: Self-pay | Admitting: Emergency Medicine

## 2017-11-14 ENCOUNTER — Emergency Department (HOSPITAL_COMMUNITY): Payer: 59

## 2017-11-14 ENCOUNTER — Other Ambulatory Visit: Payer: Self-pay

## 2017-11-14 DIAGNOSIS — C61 Malignant neoplasm of prostate: Secondary | ICD-10-CM | POA: Diagnosis present

## 2017-11-14 DIAGNOSIS — N289 Disorder of kidney and ureter, unspecified: Secondary | ICD-10-CM | POA: Diagnosis not present

## 2017-11-14 DIAGNOSIS — D72829 Elevated white blood cell count, unspecified: Secondary | ICD-10-CM | POA: Diagnosis not present

## 2017-11-14 DIAGNOSIS — M7989 Other specified soft tissue disorders: Secondary | ICD-10-CM | POA: Diagnosis not present

## 2017-11-14 DIAGNOSIS — Z8619 Personal history of other infectious and parasitic diseases: Secondary | ICD-10-CM | POA: Diagnosis present

## 2017-11-14 DIAGNOSIS — Z79899 Other long term (current) drug therapy: Secondary | ICD-10-CM | POA: Diagnosis not present

## 2017-11-14 DIAGNOSIS — N400 Enlarged prostate without lower urinary tract symptoms: Secondary | ICD-10-CM

## 2017-11-14 DIAGNOSIS — N139 Obstructive and reflux uropathy, unspecified: Secondary | ICD-10-CM | POA: Diagnosis not present

## 2017-11-14 DIAGNOSIS — I10 Essential (primary) hypertension: Secondary | ICD-10-CM | POA: Diagnosis not present

## 2017-11-14 DIAGNOSIS — F17210 Nicotine dependence, cigarettes, uncomplicated: Secondary | ICD-10-CM | POA: Diagnosis present

## 2017-11-14 DIAGNOSIS — K921 Melena: Secondary | ICD-10-CM | POA: Diagnosis present

## 2017-11-14 DIAGNOSIS — B009 Herpesviral infection, unspecified: Secondary | ICD-10-CM | POA: Diagnosis present

## 2017-11-14 DIAGNOSIS — R1111 Vomiting without nausea: Secondary | ICD-10-CM | POA: Diagnosis not present

## 2017-11-14 DIAGNOSIS — R972 Elevated prostate specific antigen [PSA]: Secondary | ICD-10-CM | POA: Diagnosis not present

## 2017-11-14 DIAGNOSIS — M109 Gout, unspecified: Secondary | ICD-10-CM | POA: Diagnosis not present

## 2017-11-14 DIAGNOSIS — R590 Localized enlarged lymph nodes: Secondary | ICD-10-CM | POA: Diagnosis present

## 2017-11-14 DIAGNOSIS — N179 Acute kidney failure, unspecified: Secondary | ICD-10-CM | POA: Diagnosis not present

## 2017-11-14 DIAGNOSIS — R3911 Hesitancy of micturition: Secondary | ICD-10-CM | POA: Diagnosis present

## 2017-11-14 DIAGNOSIS — Z8042 Family history of malignant neoplasm of prostate: Secondary | ICD-10-CM

## 2017-11-14 DIAGNOSIS — Z6841 Body Mass Index (BMI) 40.0 and over, adult: Secondary | ICD-10-CM

## 2017-11-14 DIAGNOSIS — T380X5A Adverse effect of glucocorticoids and synthetic analogues, initial encounter: Secondary | ICD-10-CM | POA: Diagnosis not present

## 2017-11-14 DIAGNOSIS — Z713 Dietary counseling and surveillance: Secondary | ICD-10-CM | POA: Diagnosis not present

## 2017-11-14 DIAGNOSIS — E872 Acidosis: Secondary | ICD-10-CM | POA: Diagnosis present

## 2017-11-14 DIAGNOSIS — D62 Acute posthemorrhagic anemia: Secondary | ICD-10-CM | POA: Diagnosis not present

## 2017-11-14 DIAGNOSIS — R3129 Other microscopic hematuria: Secondary | ICD-10-CM | POA: Diagnosis present

## 2017-11-14 DIAGNOSIS — D500 Iron deficiency anemia secondary to blood loss (chronic): Secondary | ICD-10-CM | POA: Diagnosis not present

## 2017-11-14 DIAGNOSIS — N19 Unspecified kidney failure: Secondary | ICD-10-CM | POA: Diagnosis not present

## 2017-11-14 DIAGNOSIS — E876 Hypokalemia: Secondary | ICD-10-CM | POA: Diagnosis present

## 2017-11-14 DIAGNOSIS — N1330 Unspecified hydronephrosis: Secondary | ICD-10-CM

## 2017-11-14 DIAGNOSIS — N529 Male erectile dysfunction, unspecified: Secondary | ICD-10-CM | POA: Diagnosis present

## 2017-11-14 DIAGNOSIS — N401 Enlarged prostate with lower urinary tract symptoms: Secondary | ICD-10-CM | POA: Diagnosis present

## 2017-11-14 DIAGNOSIS — K922 Gastrointestinal hemorrhage, unspecified: Secondary | ICD-10-CM | POA: Diagnosis present

## 2017-11-14 DIAGNOSIS — R509 Fever, unspecified: Secondary | ICD-10-CM | POA: Diagnosis not present

## 2017-11-14 DIAGNOSIS — E8770 Fluid overload, unspecified: Secondary | ICD-10-CM | POA: Diagnosis not present

## 2017-11-14 DIAGNOSIS — D649 Anemia, unspecified: Secondary | ICD-10-CM | POA: Diagnosis present

## 2017-11-14 DIAGNOSIS — R195 Other fecal abnormalities: Secondary | ICD-10-CM | POA: Diagnosis not present

## 2017-11-14 DIAGNOSIS — N189 Chronic kidney disease, unspecified: Secondary | ICD-10-CM | POA: Diagnosis present

## 2017-11-14 DIAGNOSIS — N1339 Other hydronephrosis: Secondary | ICD-10-CM | POA: Diagnosis present

## 2017-11-14 LAB — COMPREHENSIVE METABOLIC PANEL
ALBUMIN: 3.5 g/dL (ref 3.5–5.0)
ALT: 9 IU/L (ref 0–44)
ALT: 13 U/L — AB (ref 17–63)
AST: 14 IU/L (ref 0–40)
AST: 11 U/L — AB (ref 15–41)
Albumin/Globulin Ratio: 1.1 — ABNORMAL LOW (ref 1.2–2.2)
Albumin: 3.9 g/dL (ref 3.5–5.5)
Alkaline Phosphatase: 109 IU/L (ref 39–117)
Alkaline Phosphatase: 92 U/L (ref 38–126)
Anion gap: 17 — ABNORMAL HIGH (ref 5–15)
BUN/Creatinine Ratio: 7 — ABNORMAL LOW (ref 9–20)
BUN: 125 mg/dL (ref 6–24)
BUN: 155 mg/dL — AB (ref 6–20)
Bilirubin Total: <0.2 mg/dL (ref 0.0–1.2)
CALCIUM: 8.9 mg/dL (ref 8.7–10.2)
CHLORIDE: 105 mmol/L (ref 101–111)
CO2: 12 mmol/L — AB (ref 20–29)
CO2: 15 mmol/L — AB (ref 22–32)
CREATININE: 17.4 mg/dL — AB (ref 0.61–1.24)
Calcium: 9.3 mg/dL (ref 8.9–10.3)
Chloride: 104 mmol/L (ref 96–106)
Creatinine, Ser: 16.83 mg/dL (ref 0.76–1.27)
GFR calc Af Amer: 3 mL/min/{1.73_m2} — ABNORMAL LOW (ref >59)
GFR calc non Af Amer: 3 mL/min — ABNORMAL LOW (ref >60)
GFR, EST AFRICAN AMERICAN: 3 mL/min — AB (ref >60)
GFR, EST NON AFRICAN AMERICAN: 3 mL/min/{1.73_m2} — AB (ref >59)
Globulin, Total: 3.5 g/dL (ref 1.5–4.5)
Glucose, Bld: 93 mg/dL (ref 65–99)
Glucose: 72 mg/dL (ref 65–99)
POTASSIUM: 5 mmol/L (ref 3.5–5.2)
Potassium: 4.3 mmol/L (ref 3.5–5.1)
SODIUM: 137 mmol/L (ref 135–145)
Sodium: 140 mmol/L (ref 134–144)
Total Bilirubin: 0.7 mg/dL (ref 0.3–1.2)
Total Protein: 7.4 g/dL (ref 6.0–8.5)
Total Protein: 8.2 g/dL — ABNORMAL HIGH (ref 6.5–8.1)

## 2017-11-14 LAB — LIPID PANEL
CHOL/HDL RATIO: 3.1 ratio (ref 0.0–5.0)
CHOLESTEROL TOTAL: 98 mg/dL — AB (ref 100–199)
HDL: 32 mg/dL — AB (ref >39)
LDL Calculated: 52 mg/dL (ref 0–99)
TRIGLYCERIDES: 72 mg/dL (ref 0–149)
VLDL Cholesterol Cal: 14 mg/dL (ref 5–40)

## 2017-11-14 LAB — CBC WITH DIFFERENTIAL/PLATELET
Abs Immature Granulocytes: 0.1 10*3/uL (ref 0.0–0.1)
BASOS: 0 %
Basophils Absolute: 0 10*3/uL (ref 0.0–0.2)
Basophils Absolute: 0 10*3/uL (ref 0.0–0.1)
Basophils Relative: 0 %
EOS (ABSOLUTE): 0.2 10*3/uL (ref 0.0–0.4)
EOS ABS: 0.2 10*3/uL (ref 0.0–0.7)
Eos: 2 %
Eosinophils Relative: 2 %
HEMATOCRIT: 19.5 % — AB (ref 39.0–52.0)
Hematocrit: 20.2 % — ABNORMAL LOW (ref 37.5–51.0)
Hemoglobin: 6.8 g/dL — CL (ref 13.0–17.7)
Hemoglobin: 6.8 g/dL — CL (ref 13.0–17.0)
IMMATURE GRANS (ABS): 0.1 10*3/uL (ref 0.0–0.1)
Immature Granulocytes: 1 %
Immature Granulocytes: 1 %
LYMPHS ABS: 1.3 10*3/uL (ref 0.7–4.0)
Lymphocytes Absolute: 1.7 10*3/uL (ref 0.7–3.1)
Lymphocytes Relative: 12 %
Lymphs: 14 %
MCH: 24.5 pg — ABNORMAL LOW (ref 26.6–33.0)
MCH: 24.1 pg — ABNORMAL LOW (ref 26.0–34.0)
MCHC: 33.7 g/dL (ref 31.5–35.7)
MCHC: 34.9 g/dL (ref 30.0–36.0)
MCV: 73 fL — ABNORMAL LOW (ref 79–97)
MCV: 69.1 fL — AB (ref 78.0–100.0)
MONOS ABS: 1.9 10*3/uL — AB (ref 0.1–0.9)
MONOS PCT: 14 %
Monocytes Absolute: 1.6 10*3/uL — ABNORMAL HIGH (ref 0.1–1.0)
Monocytes: 16 %
NEUTROS PCT: 67 %
NEUTROS PCT: 71 %
Neutro Abs: 8 10*3/uL — ABNORMAL HIGH (ref 1.7–7.7)
Neutrophils Absolute: 8.3 10*3/uL — ABNORMAL HIGH (ref 1.4–7.0)
Platelets: 253 10*3/uL (ref 150–379)
Platelets: 264 10*3/uL (ref 150–400)
RBC: 2.78 x10E6/uL — AB (ref 4.14–5.80)
RBC: 2.82 MIL/uL — ABNORMAL LOW (ref 4.22–5.81)
RDW: 14.5 % (ref 12.3–15.4)
RDW: 13.3 % (ref 11.5–15.5)
WBC: 12.3 10*3/uL — AB (ref 3.4–10.8)
WBC: 11.2 10*3/uL — ABNORMAL HIGH (ref 4.0–10.5)

## 2017-11-14 LAB — ABO/RH: ABO/RH(D): B POS

## 2017-11-14 LAB — POC OCCULT BLOOD, ED: FECAL OCCULT BLD: POSITIVE — AB

## 2017-11-14 LAB — PROTIME-INR
INR: 1.25
Prothrombin Time: 15.6 seconds — ABNORMAL HIGH (ref 11.4–15.2)

## 2017-11-14 NOTE — ED Notes (Signed)
RN attempted to give Report, RN unavailable 

## 2017-11-14 NOTE — ED Notes (Signed)
Rn attempted to give report x2, RN unavailable, RN gave c/b Ph#

## 2017-11-14 NOTE — ED Provider Notes (Signed)
Patient placed in Quick Look pathway, seen and evaluated   Chief Complaint: Difficulty urinating, leg swelling  HPI:   58 year old male presents at the request of his doctor for evaluation of anemia and acute kidney failure.  He states that he has had difficulty urinating for approximately 1 month.  He has been back and forth to his doctor.  He had blood work drawn during his last office visit and he also has been having right leg swelling.  He had a ultrasound of his leg ordered.  His doctor called him today after his blood work resulted and told him to come to the emergency department because his hemoglobin was 6.8 and his creatinine is 16.83.  The previous values available in EMR normal.  He is not on blood thinners and he denies any blood in his stool.  He has had some blood in his urine.  ROS: +difficulty urinating, leg swelling  -chest pain, SOB  Physical Exam:   Gen: No distress  Neuro: Awake and Alert  Skin: Warm. Pale conjunctiva    Focused Exam: Heart: Regular rate and rhythm    Lungs: CTA    Abdomen soft, mildly tender    Legs: Right leg has pitting edema. Left leg is normal   Initiation of care has begun. The patient has been counseled on the process, plan, and necessity for staying for the completion/evaluation, and the remainder of the medical screening examination    Recardo Evangelist, PA-C 11/14/17 1924    Hayden Rasmussen, MD 11/15/17 1253

## 2017-11-14 NOTE — ED Provider Notes (Addendum)
[facility]PART[facility] Provider Note   CSN: [id number] Arrival date & time: 11/14/17  1833     History   Chief Complaint Chief Complaint  Patient presents with  . Anemia    HPI Devon Cook is a 57 y.o. male.  Complaint is abnormal labs.  HPI: 57 year old male.  History of hypertension.  History of hepatitis C in remission after Harvoni treatment over one year ago.  Urinary symptoms with decreased urination and difficulty passing urine for the last month.  Seen and treated for a prostatitis with a course of Cipro several weeks ago.  Still not feeling well.  He is a Systems developer".  Seen by his primary care and had outpatient labs obtained.  He received a call today that his labs were abnormal and was referred here.  Urinates times per day.  Is not had gross blood in his urine.  Some mild chronic back pain.  This is been worse for "the last few months".  No fevers or chills.  No weight loss.  Past Medical History:  Diagnosis Date  . Allergy   . ED (erectile dysfunction)   . Herpes genitalis in men   . Hypertension 10/2004  . Impaired fasting glucose 10/2004  . Obesity     Patient Active Problem List   Diagnosis Date Noted  . GI bleed 11/14/2017  . ARF (acute renal failure) ([place]) 11/14/2017  . Acute blood loss anemia 11/14/2017  . History of hepatitis C 11/13/2017  . Benign prostatic hyperplasia with urinary hesitancy 11/13/2017  . Hepatitis C antibody test positive 07/15/2013  . ED (erectile dysfunction) 01/09/2012  . Hypertension 01/09/2012  . Morbid obesity with BMI of 40.0-44.9, adult ([place]) 01/09/2012  . Impaired fasting glucose 01/09/2012    Past Surgical History:  Procedure Laterality Date  . TRICEPS TENDON REPAIR  08/2005        Home Medications    Prior to Admission medications   Medication Sig Start Date End Date Taking? Authorizing Provider  lisinopril-hydrochlorothiazide (PRINZIDE,ZESTORETIC) 20-25 MG tablet  Take 1 tablet by mouth daily. 11/13/17  Yes [name], MD  Multiple Vitamins-Minerals (MULTIVITAMIN WITH MINERALS) tablet Take 1 tablet by mouth daily.     Yes [provider]  ciprofloxacin (CIPRO) 500 MG tablet Take 1 tablet (500 mg total) by mouth 2 (two) times daily. Patient not taking: Reported on 11/13/2017 10/11/17   [name], MD  Ledipasvir-Sofosbuvir (HARVONI) 90-400 MG TABS Take 1 tablet by mouth daily. Patient not taking: Reported on 09/25/2017 11/18/15   Thayer Headings, MD    Family History History reviewed. No pertinent family history.  Social History Social History   Tobacco Use  . Smoking status: Current Some Day Smoker  . Smokeless tobacco: Never Used  Substance Use Topics  . Alcohol use: Yes    Comment: 2 drinks per weekend  . Drug use: No     Allergies   Patient has no known allergies.   Review of Systems Review of Systems  Constitutional: Negative for appetite change, chills, diaphoresis, fatigue and fever.  HENT: Negative for mouth sores, sore throat and trouble swallowing.   Eyes: Negative for visual disturbance.  Respiratory: Negative for cough, chest tightness, shortness of breath and wheezing.   Cardiovascular: Negative for chest pain.  Gastrointestinal: Negative for abdominal distention, abdominal pain, diarrhea, nausea and vomiting.  Endocrine: Negative for polydipsia, polyphagia and polyuria.  Genitourinary: Positive for difficulty urinating. Negative for dysuria, frequency and hematuria.  Musculoskeletal: Positive for back pain. Negative for gait problem.  Skin: Negative for color change, pallor and rash.  Neurological: Positive for weakness. Negative for dizziness, syncope, light-headedness and headaches.  Hematological: Does not bruise/bleed easily.  Psychiatric/Behavioral: Negative for behavioral problems and confusion.     Physical Exam Updated Vital Signs BP (!) 151/84   Pulse 74   Temp 98.4 F (36.9 C) (Oral)    Resp 15   SpO2 100%   Physical Exam  Constitutional: He is oriented to person, place, and time. He appears well-developed and well-nourished. No distress.  HENT:  Head: Normocephalic.  Eyes: Pupils are equal, round, and reactive to light. Conjunctivae are normal. No scleral icterus.  Neck: Normal range of motion. Neck supple. No thyromegaly present.  Cardiovascular: Normal rate and regular rhythm. Exam reveals no gallop and no friction rub.  No murmur heard. Pulmonary/Chest: Effort normal and breath sounds normal. No respiratory distress. He has no wheezes. He has no rales.  Abdominal: Soft. Bowel sounds are normal. He exhibits no distension. There is no tenderness. There is no rebound.  Musculoskeletal: Normal range of motion.  Right lower extremity swelling diffuse.  Neurological: He is alert and oriented to person, place, and time.  Skin: Skin is warm and dry. No rash noted.  Psychiatric: He has a normal mood and affect. His behavior is normal.     ED Treatments / Results  Labs (all labs ordered are listed, but only abnormal results are displayed) Labs Reviewed  COMPREHENSIVE METABOLIC PANEL - Abnormal; Notable for the following components:      Result Value   CO2 15 (*)    BUN 155 (*)    Creatinine, Ser 17.40 (*)    Total Protein 8.2 (*)    AST 11 (*)    ALT 13 (*)    GFR calc non Af Amer 3 (*)    GFR calc Af Amer 3 (*)    Anion gap 17 (*)    All other components within normal limits  CBC WITH DIFFERENTIAL/PLATELET - Abnormal; Notable for the following components:   WBC 11.2 (*)    RBC 2.82 (*)    Hemoglobin 6.8 (*)    HCT 19.5 (*)    MCV 69.1 (*)    MCH 24.1 (*)    Neutro Abs 8.0 (*)    Monocytes Absolute 1.6 (*)    All other components within normal limits  PROTIME-INR - Abnormal; Notable for the following components:   Prothrombin Time 15.6 (*)    All other components within normal limits  POC OCCULT BLOOD, ED - Abnormal; Notable for the following components:     Fecal Occult Bld POSITIVE (*)    All other components within normal limits  TYPE AND SCREEN  ABO/RH    EKG None  Radiology Ct Abdomen Pelvis Wo Contrast  Result Date: 11/14/2017 CLINICAL DATA:  Intermittent generalized abdominal pain with nausea, vomiting and diarrhea for 6 weeks. Acute anemia, renal failure and guaiac-positive stool EXAM: CT ABDOMEN AND PELVIS WITHOUT CONTRAST TECHNIQUE: Multidetector CT imaging of the abdomen and pelvis was performed following the standard protocol without IV contrast. COMPARISON:  None. FINDINGS: Lower chest: No acute abnormality. Hepatobiliary: No focal liver abnormality is seen. No gallstones, gallbladder wall thickening, or biliary dilatation. Pancreas: Unremarkable. No pancreatic ductal dilatation or surrounding inflammatory changes. Spleen: Normal in size without focal abnormality. Adrenals/Urinary Tract: Adrenal glands are unremarkable. Bilateral hydronephrosis, moderate to severe in degree. Irregular thickening of the posterior walls of the  bladder, the likely source of the bilateral hydroureter and hydronephrosis. Stomach/Bowel: Bowel is normal in caliber. No bowel wall thickening or evidence of bowel wall inflammation seen. Appendix is normal. Vascular/Lymphatic: Conglomerate lymphadenopathy throughout the retroperitoneum and along the bilateral iliac chain regions. No significant vascular abnormality. Reproductive: Prostate is unremarkable. Other: Periureteral fluid stranding. No circumscribed fluid collection or abscess like collection. No free intraperitoneal air. Musculoskeletal: Sclerotic focus at the anterior aspects of the L3 vertebral body. Osseous structures otherwise unremarkable. IMPRESSION: 1. Bilateral hydronephrosis, moderate to severe in degree, most likely caused by the irregular thickening seen along the posterior bladder walls with presumed obstruction at the bilateral UVJs. This is highly suspicious for neoplastic bladder wall  thickening. Recommend urology consultation for possible cystoscopy. Percutaneous nephrostomy (or nephrostomies) may also be needed. 2. Conglomerate lymphadenopathy throughout the retroperitoneum and bilateral iliac chain regions, metastatic lymphadenopathy versus lymphoma. Consider tissue sampling. 3. Single sclerotic focus at the anterior aspects of the L3 vertebral body. This is most likely a benign bone island given the single site, however, early osseous metastasis cannot be excluded. Would consider nonemergent MRI or nuclear medicine bone scan for further characterization. Electronically Signed   By: Franki Cabot M.D.   On: 11/14/2017 22:06    Procedures Procedures (including critical care time)  Medications Ordered in ED Medications - No data to display   Initial Impression / Assessment and Plan / ED Course  I have reviewed the triage vital signs and the nursing notes.  Pertinent labs & imaging results that were available during my care of the patient were reviewed by me and considered in my medical decision making (see chart for details).  Clinical Course as of Nov 14 2248  Tue Nov 14, 2017  2229 CT ABDOMEN PELVIS WO CONTRAST [MJ]  2229 CT ABDOMEN PELVIS WO CONTRAST [MJ]    Clinical Course User Index [MJ] Tanna Furry, MD   Rectal exam is guaiac positive brown stool.  Labs are confirmed.  He is anemic.  Normocytic.  Renal insufficient.  Creatinine of 13.  Acidosis.  CT scan ordered.  Thickening of the bladder.  Distention of the bladder.  Hydronephrosis and dilatation of the ureters.  Bulky retroperitoneal lymphadenopathy.  Concern for bladder malignancy.   This time concerns her renal failure, obstructive uropathy, anemia, guaiac positive, right lower extremity swelling- lymphedema versus DVT.  Discussed with urology.  They are in route to discuss options of stent versus nephrostomy tubes.  Admitted to medicine.  By Dr.Narang of Urology.  He requests IR consultation for  nephrostomy placement.    Discussed with IR physician on-call.  Not septic, in failure, unstable, or hyperkalemic.  We will plan early a.m. nephrostomy tube placement in IR.  Will check coags.  Patient will be n.p.o. after midnight.     Final Clinical Impressions(s) / ED Diagnoses   Final diagnoses:  Renal insufficiency    ED Discharge Orders    None       Tanna Furry, MD 11/14/17 2254    Tanna Furry, MD 11/14/17 [phone]

## 2017-11-14 NOTE — ED Notes (Signed)
RN informed 5W sec. that Pt will be arriving late d/t Pt must first be seen by urologist before coming up

## 2017-11-14 NOTE — Consult Note (Addendum)
Urology Consult Note   Requesting Attending Physician:  Elwyn Reach, MD Service Providing Consult: Urology  Consulting Attending: Alexis Frock    Reason for Consult:  Bilateral Hydronephrosis, AKI, Retroperitoneal Lymphadenopathy   HPI: Devon Cook is seen in consultation for reasons noted above at the request of Elwyn Reach, MD for evaluation of Bilateral Hydronephrosis, AKI, Retroperitoneal Lymphadenopathy .  This is a 57 y.o. male with hx of HTN and hep C s/p harvoni txt who presents with 1 month of malaise and difficulty voiding. Has seen PCP a few times for the above symptoms. Had a presumed UTI and treated with abx. Seen recently, U/A showed microscopic hematuria. BMP collected at that visit revealed Cr of 17 - patient advised to go to local ED.   Stable with normal vitals.  Cr - 17.40 WBC - 11.2  INR - 1.25   CT scan revealed bilateral hydronephrosis to the level of the bladder with diffuse bladder wall thickening. Also, bulk retroperitoneal lymphadenopathy.   No hx of gross hematuria. No prior hx of cancer or fmhx of cancer. Smokes 0.5ppd x 5 years. No prior voiding issues. No prior urologic hx.   Past Medical History: Past Medical History:  Diagnosis Date  . Allergy   . ED (erectile dysfunction)   . Herpes genitalis in men   . Hypertension 10/2004  . Impaired fasting glucose 10/2004  . Obesity     Past Surgical History:  Past Surgical History:  Procedure Laterality Date  . TRICEPS TENDON REPAIR  08/2005    Medication: No current facility-administered medications for this encounter.    Current Outpatient Medications  Medication Sig Dispense Refill  . lisinopril-hydrochlorothiazide (PRINZIDE,ZESTORETIC) 20-25 MG tablet Take 1 tablet by mouth daily. 90 tablet 3  . Multiple Vitamins-Minerals (MULTIVITAMIN WITH MINERALS) tablet Take 1 tablet by mouth daily.      . ciprofloxacin (CIPRO) 500 MG tablet Take 1 tablet (500 mg total) by mouth 2 (two)  times daily. (Patient not taking: Reported on 11/13/2017) 28 tablet 0  . Ledipasvir-Sofosbuvir (HARVONI) 90-400 MG TABS Take 1 tablet by mouth daily. (Patient not taking: Reported on 09/25/2017) 28 tablet 2    Allergies: No Known Allergies  Social History: Social History   Tobacco Use  . Smoking status: Current Some Day Smoker  . Smokeless tobacco: Never Used  Substance Use Topics  . Alcohol use: Yes    Comment: 2 drinks per weekend  . Drug use: No    Family History History reviewed. No pertinent family history.  Review of Systems 10 systems were reviewed and are negative except as noted specifically in the HPI.  Objective   Vital signs in last 24 hours: BP (!) 151/84   Pulse 74   Temp 98.4 F (36.9 C) (Oral)   Resp 15   SpO2 100%   Physical Exam General: NAD, A&O, resting, appropriate HEENT: [place]/AT, EOMI, MMM Pulmonary: Normal work of breathing Cardiovascular: HDS, adequate peripheral perfusion Abdomen: Soft, NTTP, nondistended, . GU: uncircumcised penis, bilateral testes descended , no CVA tenderness Extremities: warm and well perfused, R > L Le swelling  Neuro: Appropriate, no focal neurological deficits  Most Recent Labs: Lab Results  Component Value Date   WBC 11.2 (H) 11/14/2017   HGB 6.8 (LL) 11/14/2017   HCT 19.5 (L) 11/14/2017   PLT 264 11/14/2017    Lab Results  Component Value Date   NA 137 11/14/2017   K 4.3 11/14/2017   CL 105 11/14/2017   CO2  15 (L) 11/14/2017   BUN 155 (H) 11/14/2017   CREATININE 17.40 (H) 11/14/2017   CALCIUM 9.3 11/14/2017    Lab Results  Component Value Date   INR 1.25 11/14/2017     IMAGING: Ct Abdomen Pelvis Wo Contrast  Result Date: 11/14/2017 CLINICAL DATA:  Intermittent generalized abdominal pain with nausea, vomiting and diarrhea for 6 weeks. Acute anemia, renal failure and guaiac-positive stool EXAM: CT ABDOMEN AND PELVIS WITHOUT CONTRAST TECHNIQUE: Multidetector CT imaging of the abdomen and pelvis was  performed following the standard protocol without IV contrast. COMPARISON:  None. FINDINGS: Lower chest: No acute abnormality. Hepatobiliary: No focal liver abnormality is seen. No gallstones, gallbladder wall thickening, or biliary dilatation. Pancreas: Unremarkable. No pancreatic ductal dilatation or surrounding inflammatory changes. Spleen: Normal in size without focal abnormality. Adrenals/Urinary Tract: Adrenal glands are unremarkable. Bilateral hydronephrosis, moderate to severe in degree. Irregular thickening of the posterior walls of the bladder, the likely source of the bilateral hydroureter and hydronephrosis. Stomach/Bowel: Bowel is normal in caliber. No bowel wall thickening or evidence of bowel wall inflammation seen. Appendix is normal. Vascular/Lymphatic: Conglomerate lymphadenopathy throughout the retroperitoneum and along the bilateral iliac chain regions. No significant vascular abnormality. Reproductive: Prostate is unremarkable. Other: Periureteral fluid stranding. No circumscribed fluid collection or abscess like collection. No free intraperitoneal air. Musculoskeletal: Sclerotic focus at the anterior aspects of the L3 vertebral body. Osseous structures otherwise unremarkable. IMPRESSION: 1. Bilateral hydronephrosis, moderate to severe in degree, most likely caused by the irregular thickening seen along the posterior bladder walls with presumed obstruction at the bilateral UVJs. This is highly suspicious for neoplastic bladder wall thickening. Recommend urology consultation for possible cystoscopy. Percutaneous nephrostomy (or nephrostomies) may also be needed. 2. Conglomerate lymphadenopathy throughout the retroperitoneum and bilateral iliac chain regions, metastatic lymphadenopathy versus lymphoma. Consider tissue sampling. 3. Single sclerotic focus at the anterior aspects of the L3 vertebral body. This is most likely a benign bone island given the single site, however, early osseous  metastasis cannot be excluded. Would consider nonemergent MRI or nuclear medicine bone scan for further characterization. Electronically Signed   By: Franki Cabot M.D.   On: 11/14/2017 22:06    ------  Assessment:  57 y.o. male with elevated cr to 17 and bilateral hydronephrosis to the level of the bladder. No obvious obstructing source. Thickened bladder and retroperitoneal lymphadenopathy concerning for bladder cancer.   Most acute issue is severe elevation in Cr. Options discussed: Observation, ureteral stent placement and bilateral PCNs. Observation not appropriate. Ureteral stents would be difficult given diffuse bladder wall thickening. In setting of needing urgent decompression, bilateral PCN is the most appropriate option. Risk of the procedure highlighted. Patient amenable   Recommendations: - Consult IR for urgent bilateral PCN (spoke to ED team who will contact IR)  - NPO  - Urology will continue to follow - based on clinical course, patient may be eligible for internalization of stents - Will require outpatient urology follow up for cystoscopy and work up probably bladder cancer  - Recommend LE doppler given R > L LE swelling    Thank you for this consult. Please contact the urology consult pager with any further questions/concerns.

## 2017-11-14 NOTE — ED Triage Notes (Signed)
Pt sent from PCP for low hemoglobin and high creatinine. Pt reports one month of urinary difficulty. Pt in NAD, a/o x4. Also reports right calf pain with warmth and swelling.

## 2017-11-14 NOTE — ED Notes (Signed)
RN informed by Dr. Jeneen Rinks that Pt cannot go up to floor until the urologist comes and assess him. Charge Rn aware

## 2017-11-14 NOTE — H&P (Signed)
History and Physical    JAYTHAN HINELY HWE:[id number] DOB: September 16, 1960 DOA: 11/14/2017  Referring MD/NP/PA: Dr. Tanna Furry PCP: [name], MD   Outpatient Specialists: None   Patient coming from: Home  Chief Complaint: abnormal labs  HPI: Devon Cook is a 57 y.o. male with medical history significant of prostatitis last year as well as hepatitis C in remission, hypertension with erectile dysfunction who came to the ER today after being sent over by his PCP. Patient was having urinary symptoms last month. He was seen and treated for her satiety is at the time. Patient completed Cipro. He was seen again yesterday and at that time blood work was obtained. Patient was found to have a creatinine of about 17 as well as significant anemia. He was called today and told to come to the emergency room. In the ER patient was found to have acute kidney injury, hemoglobin of 6.8 with white positive stools, significant distention of the bladder, and what appears to be multiple intra-abdominal lymphadenopathy. Patient is therefore being admitted for workup. Patient also has right-sided swelling of the lower extremity with some mild pain. DVT suspected.   ED Course: patient is afebrile. Blood pressure is 177/96 with pulse 75. BUN is 155 and creatinine 17.4 with a gap of 17 CO2 of 15 potassium is 4.3. White count is 11.2 with hemoglobin 6.8 and platelets 264. Patient is still guaiac positive. noncontrasted abdominal CT showed severe bilateral hydronephrosis with thickening of the posterior part of the bladder probably obstructing the ureters.  Review of Systems: As per HPI otherwise 10 point review of systems negative.   Past Medical History:  Diagnosis Date  . Allergy   . ED (erectile dysfunction)   . Herpes genitalis in men   . Hypertension 10/2004  . Impaired fasting glucose 10/2004  . Obesity     Past Surgical History:  Procedure Laterality Date  . TRICEPS TENDON REPAIR  08/2005     reports that he has been smoking.  He has never used smokeless tobacco. He reports that he drinks alcohol. He reports that he does not use drugs.  No Known Allergies  History reviewed. No pertinent family history.   Prior to Admission medications   Medication Sig Start Date End Date Taking? Authorizing Provider  lisinopril-hydrochlorothiazide (PRINZIDE,ZESTORETIC) 20-25 MG tablet Take 1 tablet by mouth daily. 11/13/17  Yes [name], MD  Multiple Vitamins-Minerals (MULTIVITAMIN WITH MINERALS) tablet Take 1 tablet by mouth daily.     Yes [provider]  ciprofloxacin (CIPRO) 500 MG tablet Take 1 tablet (500 mg total) by mouth 2 (two) times daily. Patient not taking: Reported on 11/13/2017 10/11/17   [name], MD  Ledipasvir-Sofosbuvir (HARVONI) 90-400 MG TABS Take 1 tablet by mouth daily. Patient not taking: Reported on 09/25/2017 11/18/15   Thayer Headings, MD    Physical Exam: Vitals:   11/14/17 2130 11/14/17 2207 11/14/17 2215 11/14/17 2230  BP: (!) 167/98 (!) 153/88 (!) 151/84 (!) 177/96  Pulse: 84 74 74 75  Resp: (!) 25 16 15 16   Temp:      TempSrc:      SpO2: 99% 100% 100% 100%      Constitutional: NAD, calm, comfortable Vitals:   11/14/17 2130 11/14/17 2207 11/14/17 2215 11/14/17 2230  BP: (!) 167/98 (!) 153/88 (!) 151/84 (!) 177/96  Pulse: 84 74 74 75  Resp: (!) 25 16 15 16   Temp:      TempSrc:  SpO2: 99% 100% 100% 100%   Eyes: PERRL, lids and conjunctivae normal ENMT: Mucous membranes are moist. Posterior pharynx clear of any exudate or lesions.Normal dentition.  Neck: normal, supple, no masses, no thyromegaly Respiratory: clear to auscultation bilaterally, no wheezing, mild crackles. Normal respiratory effort. No accessory muscle use.  Cardiovascular: Regular rate and rhythm, no murmurs / rubs / gallops. No extremity edema. 2+ pedal pulses. No carotid bruits.  Abdomen: no tenderness, no masses palpated. No hepatosplenomegaly. Bowel sounds  positive.  Musculoskeletal: no clubbing / cyanosis. No joint deformity upper and lower extremities. Good ROM, no contractures. Normal muscle tone.  Skin: no rashes, lesions, ulcers. No induration Neurologic: CN 2-12 grossly intact. Sensation intact, DTR normal. Strength 5/5 in all 4.  Psychiatric: Normal judgment and insight. Alert and oriented x 3. Normal mood.  Ext: right lower extremity swelling, nonpitting, tender to touch    Labs on Admission: I have personally reviewed following labs and imaging studies  CBC: Recent Labs  Lab 11/13/17 0959 11/14/17 1927  WBC 12.3* 11.2*  NEUTROABS 8.3* 8.0*  HGB 6.8* 6.8*  HCT 20.2* 19.5*  MCV 73* 69.1*  PLT 253 387   Basic Metabolic Panel: Recent Labs  Lab 11/13/17 0959 11/14/17 1927  NA 140 137  K 5.0 4.3  CL 104 105  CO2 12* 15*  GLUCOSE 72 93  BUN 125* 155*  CREATININE 16.83* 17.40*  CALCIUM 8.9 9.3   GFR: Estimated Creatinine Clearance: 6.8 mL/min (A) (by C-G formula based on SCr of 17.4 mg/dL (H)). Liver Function Tests: Recent Labs  Lab 11/13/17 0959 11/14/17 1927  AST 14 11*  ALT 9 13*  ALKPHOS 109 92  BILITOT <0.2 0.7  PROT 7.4 8.2*  ALBUMIN 3.9 3.5   No results for input(s): LIPASE, AMYLASE in the last 168 hours. No results for input(s): AMMONIA in the last 168 hours. Coagulation Profile: Recent Labs  Lab 11/14/17 1927  INR 1.25   Cardiac Enzymes: No results for input(s): CKTOTAL, CKMB, CKMBINDEX, TROPONINI in the last 168 hours. BNP (last 3 results) No results for input(s): PROBNP in the last 8760 hours. HbA1C: No results for input(s): HGBA1C in the last 72 hours. CBG: No results for input(s): GLUCAP in the last 168 hours. Lipid Profile: Recent Labs    11/13/17 0959  CHOL 98*  HDL 32*  LDLCALC 52  TRIG 72  CHOLHDL 3.1   Thyroid Function Tests: No results for input(s): TSH, T4TOTAL, FREET4, T3FREE, THYROIDAB in the last 72 hours. Anemia Panel: No results for input(s): VITAMINB12, FOLATE,  FERRITIN, TIBC, IRON, RETICCTPCT in the last 72 hours. Urine analysis:    Component Value Date/Time   LABSPEC 1.015 11/13/2017 1209   BILIRUBINUR negative 11/13/2017 1209   BILIRUBINUR neg 09/07/2015 1445   KETONESUR negative 11/13/2017 1209   PROTEINUR negative 11/13/2017 1209   PROTEINUR 1+ 0.3 09/07/2015 1445   UROBILINOGEN 4.0 09/07/2015 1445   NITRITE Negative 11/13/2017 1209   NITRITE neg 09/07/2015 1445   LEUKOCYTESUR Negative 11/13/2017 1209   Sepsis Labs: @LABRCNTIP (procalcitonin:4,lacticidven:4) )No results found for this or any previous visit (from the past 240 hour(s)).   Radiological Exams on Admission: Ct Abdomen Pelvis Wo Contrast  Result Date: 11/14/2017 CLINICAL DATA:  Intermittent generalized abdominal pain with nausea, vomiting and diarrhea for 6 weeks. Acute anemia, renal failure and guaiac-positive stool EXAM: CT ABDOMEN AND PELVIS WITHOUT CONTRAST TECHNIQUE: Multidetector CT imaging of the abdomen and pelvis was performed following the standard protocol without IV contrast. COMPARISON:  None. FINDINGS:  Lower chest: No acute abnormality. Hepatobiliary: No focal liver abnormality is seen. No gallstones, gallbladder wall thickening, or biliary dilatation. Pancreas: Unremarkable. No pancreatic ductal dilatation or surrounding inflammatory changes. Spleen: Normal in size without focal abnormality. Adrenals/Urinary Tract: Adrenal glands are unremarkable. Bilateral hydronephrosis, moderate to severe in degree. Irregular thickening of the posterior walls of the bladder, the likely source of the bilateral hydroureter and hydronephrosis. Stomach/Bowel: Bowel is normal in caliber. No bowel wall thickening or evidence of bowel wall inflammation seen. Appendix is normal. Vascular/Lymphatic: Conglomerate lymphadenopathy throughout the retroperitoneum and along the bilateral iliac chain regions. No significant vascular abnormality. Reproductive: Prostate is unremarkable. Other:  Periureteral fluid stranding. No circumscribed fluid collection or abscess like collection. No free intraperitoneal air. Musculoskeletal: Sclerotic focus at the anterior aspects of the L3 vertebral body. Osseous structures otherwise unremarkable. IMPRESSION: 1. Bilateral hydronephrosis, moderate to severe in degree, most likely caused by the irregular thickening seen along the posterior bladder walls with presumed obstruction at the bilateral UVJs. This is highly suspicious for neoplastic bladder wall thickening. Recommend urology consultation for possible cystoscopy. Percutaneous nephrostomy (or nephrostomies) may also be needed. 2. Conglomerate lymphadenopathy throughout the retroperitoneum and bilateral iliac chain regions, metastatic lymphadenopathy versus lymphoma. Consider tissue sampling. 3. Single sclerotic focus at the anterior aspects of the L3 vertebral body. This is most likely a benign bone island given the single site, however, early osseous metastasis cannot be excluded. Would consider nonemergent MRI or nuclear medicine bone scan for further characterization. Electronically Signed   By: Franki Cabot M.D.   On: 11/14/2017 22:06     Assessment/Plan Principal Problem:   ARF (acute renal failure) (HCC) Active Problems:   Hypertension   Morbid obesity with BMI of 40.0-44.9, adult ([place])   History of hepatitis C   GI bleed   Acute blood loss anemia    #1 acute renal failure: Most likely secondary to obstructive uropathy as indicated by findings of noncontrasted CT above. Urology has been consulted. Patient will need decompression of the hydronephrosis. We'll continue to follow renal function after urologic procedure.  #2 GI bleed: He has fecal Occult blood tests positive 2.patient will be transfused 2 units of packed red blood cells. Monitor H&H. GI consult when patient is stable from the renal point of view. He will more than likely need EGD and colonoscopy.  #3 acute blood loss  anemia: As indicated above. H&H is low. Patient will be transfused 2 units of packed red blood cells. IV Protonix will be initiated. GI consult when stable  #4 extensive retroperitoneal lymphadenopathy: Most likely metastatic cancer. Patient will need biopsy with pathology to determine cause. Most likely from bladder cancer.  #5 history of hepatitis C: Stable at this point. Continue monitoring  #6 morbid obesity: Counseling provided  #7 possible right lower extremity DVT: Patient has possible malignancy which makes him at risk and swelling of the right lower extremity. This could be from a DVT or retroperitoneal lymphadenopathy compressing lymphatic drainage. He will need lower extremity Doppler ultrasound. Patient however could not be initiated on anticoagulation in the setting of acute GI bleed.   DVT prophylaxis: SCD  Code Status: Full  Family Communication: None at bedside  Disposition Plan: To be determined  Consults called: Urology, Dr Fredrik Rigger Admission status: inpatient  Severity of Illness: The appropriate patient status for this patient is INPATIENT. Inpatient status is judged to be reasonable and necessary in order to provide the required intensity of service to ensure the patient's safety. The  patient's presenting symptoms, physical exam findings, and initial radiographic and laboratory data in the context of their chronic comorbidities is felt to place them at high risk for further clinical deterioration. Furthermore, it is not anticipated that the patient will be medically stable for discharge from the hospital within 2 midnights of admission. The following factors support the patient status of inpatient.   " The patient's presenting symptoms include right lower extremity swelling. " The worrisome physical exam findings include tenderness and swelling of the right lower extremity. " The initial radiographic and laboratory data are worrisome because of creatinine of 17. " The  chronic co-morbidities include morbid obesity.   * I certify that at the point of admission it is my clinical judgment that the patient will require inpatient hospital care spanning beyond 2 midnights from the point of admission due to high intensity of service, high risk for further deterioration and high frequency of surveillance required.Barbette Merino MD [facility] Pager [phone]  If 7PM-7AM, please contact night-coverage [email or web address] Password [facility]  11/14/2017, 11:57 PM

## 2017-11-15 ENCOUNTER — Other Ambulatory Visit: Payer: Self-pay

## 2017-11-15 ENCOUNTER — Encounter (HOSPITAL_COMMUNITY): Payer: Self-pay | Admitting: Interventional Radiology

## 2017-11-15 ENCOUNTER — Inpatient Hospital Stay (HOSPITAL_COMMUNITY): Payer: 59

## 2017-11-15 DIAGNOSIS — N1330 Unspecified hydronephrosis: Secondary | ICD-10-CM

## 2017-11-15 DIAGNOSIS — Z8619 Personal history of other infectious and parasitic diseases: Secondary | ICD-10-CM

## 2017-11-15 HISTORY — PX: IR NEPHROSTOMY PLACEMENT LEFT: IMG6063

## 2017-11-15 HISTORY — PX: IR NEPHROSTOMY PLACEMENT RIGHT: IMG6064

## 2017-11-15 LAB — CBC WITH DIFFERENTIAL/PLATELET
BASOS PCT: 0 %
Basophils Absolute: 0 10*3/uL (ref 0.0–0.1)
EOS ABS: 0.2 10*3/uL (ref 0.0–0.7)
Eosinophils Relative: 2 %
HCT: 17.4 % — ABNORMAL LOW (ref 39.0–52.0)
HEMOGLOBIN: 6.1 g/dL — AB (ref 13.0–17.0)
LYMPHS ABS: 1.5 10*3/uL (ref 0.7–4.0)
LYMPHS PCT: 13 %
MCH: 24.2 pg — AB (ref 26.0–34.0)
MCHC: 35.1 g/dL (ref 30.0–36.0)
MCV: 69 fL — ABNORMAL LOW (ref 78.0–100.0)
MONO ABS: 1.7 10*3/uL — AB (ref 0.1–1.0)
Monocytes Relative: 15 %
NEUTROS ABS: 8.1 10*3/uL — AB (ref 1.7–7.7)
Neutrophils Relative %: 70 %
Platelets: 243 10*3/uL (ref 150–400)
RBC: 2.52 MIL/uL — ABNORMAL LOW (ref 4.22–5.81)
RDW: 13.3 % (ref 11.5–15.5)
WBC: 11.5 10*3/uL — ABNORMAL HIGH (ref 4.0–10.5)

## 2017-11-15 LAB — COMPREHENSIVE METABOLIC PANEL
ALBUMIN: 3.2 g/dL — AB (ref 3.5–5.0)
ALT: 11 U/L — AB (ref 17–63)
AST: 11 U/L — AB (ref 15–41)
Alkaline Phosphatase: 86 U/L (ref 38–126)
Anion gap: 19 — ABNORMAL HIGH (ref 5–15)
BILIRUBIN TOTAL: 0.4 mg/dL (ref 0.3–1.2)
BUN: 158 mg/dL — AB (ref 6–20)
CALCIUM: 8.9 mg/dL (ref 8.9–10.3)
CO2: 13 mmol/L — ABNORMAL LOW (ref 22–32)
Chloride: 105 mmol/L (ref 101–111)
Creatinine, Ser: 18.63 mg/dL — ABNORMAL HIGH (ref 0.61–1.24)
GFR calc Af Amer: 3 mL/min — ABNORMAL LOW (ref >60)
GFR, EST NON AFRICAN AMERICAN: 2 mL/min — AB (ref >60)
GLUCOSE: 87 mg/dL (ref 65–99)
Potassium: 4.1 mmol/L (ref 3.5–5.1)
Sodium: 137 mmol/L (ref 135–145)
TOTAL PROTEIN: 7.5 g/dL (ref 6.5–8.1)

## 2017-11-15 LAB — PHOSPHORUS: PHOSPHORUS: 9.2 mg/dL — AB (ref 2.5–4.6)

## 2017-11-15 LAB — HEMOGLOBIN AND HEMATOCRIT, BLOOD
HCT: 24.1 % — ABNORMAL LOW (ref 39.0–52.0)
HEMOGLOBIN: 8.5 g/dL — AB (ref 13.0–17.0)

## 2017-11-15 LAB — HIV ANTIBODY (ROUTINE TESTING W REFLEX): HIV Screen 4th Generation wRfx: NONREACTIVE

## 2017-11-15 LAB — PREPARE RBC (CROSSMATCH)

## 2017-11-15 MED ORDER — ACETAMINOPHEN 650 MG RE SUPP: 650.0000 mg | Freq: Four times a day (QID) | RECTAL | Status: DC | PRN

## 2017-11-15 MED ORDER — CLONIDINE HCL 0.1 MG PO TABS
0.1000 mg | ORAL_TABLET | Freq: Three times a day (TID) | ORAL | Status: DC
  Administered 2017-11-15 – 2017-11-16 (×3): 0.1 mg via ORAL
  Filled 2017-11-15 (×3): qty 1

## 2017-11-15 MED ORDER — CEFTRIAXONE SODIUM 2 G IJ SOLR
2.0000 g | INTRAMUSCULAR | Status: AC
  Filled 2017-11-15: qty 20

## 2017-11-15 MED ORDER — ONDANSETRON HCL 4 MG/2ML IJ SOLN: 4.0000 mg | Freq: Four times a day (QID) | INTRAMUSCULAR | Status: DC | PRN

## 2017-11-15 MED ORDER — MIDAZOLAM HCL 2 MG/2ML IJ SOLN
INTRAMUSCULAR | Status: AC
  Filled 2017-11-15: qty 2

## 2017-11-15 MED ORDER — SODIUM CHLORIDE 0.9% FLUSH
5.0000 mL | Freq: Three times a day (TID) | INTRAVENOUS | Status: DC
Start: 2017-11-15 — End: 2017-12-02
  Administered 2017-11-15 – 2017-11-20 (×10): 5 mL
  Administered 2017-11-20 – 2017-11-22 (×5): 10 mL
  Administered 2017-11-22: 5 mL
  Administered 2017-11-23: 10 mL
  Administered 2017-11-23 – 2017-12-02 (×24): 5 mL

## 2017-11-15 MED ORDER — LIDOCAINE HCL (PF) 1 % IJ SOLN
INTRAMUSCULAR | Status: AC
  Filled 2017-11-15: qty 30

## 2017-11-15 MED ORDER — CALCIUM CARBONATE ANTACID 1250 MG/5ML PO SUSP
500.0000 mg | Freq: Four times a day (QID) | ORAL | Status: DC | PRN
  Administered 2017-11-21 – 2017-11-23 (×2): 500 mg via ORAL
  Filled 2017-11-15 (×2): qty 5

## 2017-11-15 MED ORDER — HYDRALAZINE HCL 20 MG/ML IJ SOLN: 10.0000 mg | Freq: Four times a day (QID) | INTRAMUSCULAR | Status: DC | PRN

## 2017-11-15 MED ORDER — FENTANYL CITRATE (PF) 100 MCG/2ML IJ SOLN
INTRAMUSCULAR | Status: AC
  Filled 2017-11-15: qty 2

## 2017-11-15 MED ORDER — HYDROXYZINE HCL 25 MG PO TABS
25.0000 mg | ORAL_TABLET | Freq: Three times a day (TID) | ORAL | Status: DC | PRN
Start: 2017-11-15 — End: 2017-12-02

## 2017-11-15 MED ORDER — MIDAZOLAM HCL 2 MG/2ML IJ SOLN
INTRAMUSCULAR | Status: AC | PRN
  Administered 2017-11-15 (×3): 0.5 mg via INTRAVENOUS
  Administered 2017-11-15: 1 mg via INTRAVENOUS
  Administered 2017-11-15: 0.5 mg via INTRAVENOUS

## 2017-11-15 MED ORDER — SODIUM CHLORIDE 0.9 % IV SOLN
Freq: Once | INTRAVENOUS | Status: AC
  Administered 2017-11-15: 03:00:00 via INTRAVENOUS

## 2017-11-15 MED ORDER — LIDOCAINE HCL 1 % IJ SOLN
INTRAMUSCULAR | Status: AC
  Filled 2017-11-15: qty 20

## 2017-11-15 MED ORDER — STERILE WATER FOR INJECTION IV SOLN
INTRAVENOUS | Status: DC
Start: 2017-11-15 — End: 2017-11-20
  Administered 2017-11-15 – 2017-11-20 (×9): via INTRAVENOUS
  Filled 2017-11-15 (×17): qty 850

## 2017-11-15 MED ORDER — ADULT MULTIVITAMIN W/MINERALS CH
1.0000 | ORAL_TABLET | Freq: Every day | ORAL | Status: DC
  Administered 2017-11-16 – 2017-12-02 (×17): 1 via ORAL
  Filled 2017-11-15 (×17): qty 1

## 2017-11-15 MED ORDER — SORBITOL 70 % SOLN
30.0000 mL | Status: DC | PRN
  Filled 2017-11-15: qty 30

## 2017-11-15 MED ORDER — MORPHINE SULFATE (PF) 2 MG/ML IV SOLN
1.0000 mg | INTRAVENOUS | Status: DC | PRN
  Administered 2017-11-15 – 2017-11-18 (×7): 1 mg via INTRAVENOUS
  Filled 2017-11-15 (×8): qty 1

## 2017-11-15 MED ORDER — ZOLPIDEM TARTRATE 5 MG PO TABS
5.0000 mg | ORAL_TABLET | Freq: Every evening | ORAL | Status: DC | PRN
  Administered 2017-11-15 – 2017-12-01 (×14): 5 mg via ORAL
  Filled 2017-11-15 (×14): qty 1

## 2017-11-15 MED ORDER — ONDANSETRON HCL 4 MG PO TABS: 4.0000 mg | ORAL_TABLET | Freq: Four times a day (QID) | ORAL | Status: DC | PRN

## 2017-11-15 MED ORDER — MORPHINE SULFATE (PF) 4 MG/ML IV SOLN
4.0000 mg | Freq: Once | INTRAVENOUS | Status: AC
  Administered 2017-11-15: 4 mg via INTRAVENOUS
  Filled 2017-11-15: qty 1

## 2017-11-15 MED ORDER — NEPRO/CARBSTEADY PO LIQD: 237.0000 mL | Freq: Three times a day (TID) | ORAL | Status: DC | PRN

## 2017-11-15 MED ORDER — LIDOCAINE HCL (PF) 1 % IJ SOLN
INTRAMUSCULAR | Status: AC | PRN
  Administered 2017-11-15: 20 mL

## 2017-11-15 MED ORDER — DOCUSATE SODIUM 283 MG RE ENEM
1.0000 | ENEMA | RECTAL | Status: DC | PRN
  Filled 2017-11-15: qty 1

## 2017-11-15 MED ORDER — PANTOPRAZOLE SODIUM 40 MG IV SOLR
40.0000 mg | Freq: Two times a day (BID) | INTRAVENOUS | Status: DC
  Administered 2017-11-15 – 2017-11-16 (×3): 40 mg via INTRAVENOUS
  Filled 2017-11-15 (×3): qty 40

## 2017-11-15 MED ORDER — FENTANYL CITRATE (PF) 100 MCG/2ML IJ SOLN
INTRAMUSCULAR | Status: AC | PRN
  Administered 2017-11-15 (×4): 25 ug via INTRAVENOUS
  Administered 2017-11-15: 50 ug via INTRAVENOUS
  Administered 2017-11-15: 25 ug via INTRAVENOUS

## 2017-11-15 MED ORDER — IOPAMIDOL (ISOVUE-300) INJECTION 61%
INTRAVENOUS | Status: AC
  Administered 2017-11-15: 30 mL
  Filled 2017-11-15: qty 50

## 2017-11-15 MED ORDER — CAMPHOR-MENTHOL 0.5-0.5 % EX LOTN
1.0000 "application " | TOPICAL_LOTION | Freq: Three times a day (TID) | CUTANEOUS | Status: DC | PRN
  Filled 2017-11-15: qty 222

## 2017-11-15 MED ORDER — SODIUM CHLORIDE 0.9 % IV SOLN
INTRAVENOUS | Status: DC
  Administered 2017-11-15: 15:00:00 via INTRAVENOUS

## 2017-11-15 MED ORDER — ACETAMINOPHEN 325 MG PO TABS
650.0000 mg | ORAL_TABLET | Freq: Four times a day (QID) | ORAL | Status: DC | PRN
  Administered 2017-11-18 – 2017-12-01 (×9): 650 mg via ORAL
  Filled 2017-11-15 (×9): qty 2

## 2017-11-15 MED ORDER — LIDOCAINE HCL URETHRAL/MUCOSAL 2 % EX GEL
1.0000 "application " | Freq: Once | CUTANEOUS | Status: AC
  Administered 2017-11-15: 1 via URETHRAL
  Filled 2017-11-15: qty 20

## 2017-11-15 NOTE — Progress Notes (Signed)
Pt taken to interventional radiology with RN.  Blood transfusion continues and hand off given to [employer].

## 2017-11-15 NOTE — Progress Notes (Signed)
PROGRESS NOTE    Devon Cook  GBT:[id number] DOB: 1961-01-25 DOA: 11/14/2017 PCP: [name], MD  Brief Narrative:57 year old male with history of hypertension and hep C admitted with malaise and difficulty voiding for 1-2 months, saw  PCP was treated with couple of rounds of antibiotics, had labs drawn yesterday which revealed a creatinine of 17 and was referred to the emergency room -CT revealed bilateral hydronephrosis at the level of the bladder with diffuse bladder wall thickening, bulky retroperitoneal lymphadenopathy -urology consulted, IR consulted, status post bilateral percutaneous nephrostomy tubes 5/15 a.m.   Assessment & Plan:   Principal Problem:   ARF (acute renal failure) (HCC) -postobstructive primarily, bilateral hydronephrosis, bladder CA suspected based on imaging -Was also on lisinopril and taking NSAIDs prior to admission -Baseline creatinine was normal in 2017 -Urology following, interventional radiology consulted -status post bilateral nephrostomy tubes 5/15 a.m. -Monitor urine output and labs daily -Nephrology consulted as well as given severity of renal failure -Continue IV fluids at this time  Bilateral severe hydronephrosis, bladder wall thickening and bulky retroperitoneal lymphadenopathy -All this is concerning for  Metastatic bladder CA -Urology following, will need cystoscopy and biopsy  Anemia -Acute, last labs show hemoglobin of 15 from 2017 -Suspect this is primarily secondary to possible underlying bladder CA -However patient also gives history of melena/black stools for few days, was Hemoccult positive in the emergency room, he uses NSAIDs at least a few times a week -Start IV protonix, clear liquids, will consult gastroenterology    History of hepatitis C -status post Harvoni rX  DVT prophylaxis: SCDs Code Status: Full code Family Communication: wife at bedside Disposition Plan: pending above workup  Consultants:    IR  Renal  Urology   Procedures: Bilateral PCNs 5/15  Antimicrobials:    Subjective: -reports dark stools since Monday  Objective: Vitals:   11/15/17 1135 11/15/17 1140 11/15/17 1150 11/15/17 1215  BP: (!) 156/118 (!) 161/105 (!) 171/89 (!) 170/97  Pulse: 81 80 85 77  Resp: 16 15 15 17   Temp:    98 F (36.7 C)  TempSrc:    Oral  SpO2: 100% 99% 98%   Weight:      Height:        Intake/Output Summary (Last 24 hours) at 11/15/2017 1309 Last data filed at 11/15/2017 1200 Gross per 24 hour  Intake 639 ml  Output 1505 ml  Net -866 ml   Filed Weights   11/15/17 0605  Weight: (!) 136.4 kg (300 lb 11.3 oz)    Examination:  General exam: Appears calm and comfortable, well-built male, somnolent after anesthesia Respiratory system: Clear to auscultation. Respiratory effort normal. Cardiovascular system: S1 & S2 heard, RRR. No JVD, murmurs, rubs, gallops Gastrointestinal system: Abdomen is nondistended, soft and nontender.Normal bowel sounds heard, bilateral percutaneous nephrostomy tubes Central nervous system:somnolent but arousable,. No focal neurological deficits. Extremities: Symmetric 5 x 5 power. Skin: No rashes, lesions or ulcers Psychiatry: Judgement and insight appear normal. Mood & affect appropriate.     Data Reviewed:   CBC: Recent Labs  Lab 11/13/17 0959 11/14/17 1927 11/15/17 0022  WBC 12.3* 11.2* 11.5*  NEUTROABS 8.3* 8.0* 8.1*  HGB 6.8* 6.8* 6.1*  HCT 20.2* 19.5* 17.4*  MCV 73* 69.1* 69.0*  PLT 253 264 710   Basic Metabolic Panel: Recent Labs  Lab 11/13/17 0959 11/14/17 1927 11/15/17 0022  NA 140 137 137  K 5.0 4.3 4.1  CL 104 105 105  CO2 12* 15* 13*  GLUCOSE 72  93 87  BUN 125* 155* 158*  CREATININE 16.83* 17.40* 18.63*  CALCIUM 8.9 9.3 8.9  PHOS  --   --  9.2*   GFR: Estimated Creatinine Clearance: 6.3 mL/min (A) (by C-G formula based on SCr of 18.63 mg/dL (H)). Liver Function Tests: Recent Labs  Lab 11/13/17 0959  11/14/17 1927 11/15/17 0022  AST 14 11* 11*  ALT 9 13* 11*  ALKPHOS 109 92 86  BILITOT <0.2 0.7 0.4  PROT 7.4 8.2* 7.5  ALBUMIN 3.9 3.5 3.2*   No results for input(s): LIPASE, AMYLASE in the last 168 hours. No results for input(s): AMMONIA in the last 168 hours. Coagulation Profile: Recent Labs  Lab 11/14/17 1927  INR 1.25   Cardiac Enzymes: No results for input(s): CKTOTAL, CKMB, CKMBINDEX, TROPONINI in the last 168 hours. BNP (last 3 results) No results for input(s): PROBNP in the last 8760 hours. HbA1C: No results for input(s): HGBA1C in the last 72 hours. CBG: No results for input(s): GLUCAP in the last 168 hours. Lipid Profile: Recent Labs    11/13/17 0959  CHOL 98*  HDL 32*  LDLCALC 52  TRIG 72  CHOLHDL 3.1   Thyroid Function Tests: No results for input(s): TSH, T4TOTAL, FREET4, T3FREE, THYROIDAB in the last 72 hours. Anemia Panel: No results for input(s): VITAMINB12, FOLATE, FERRITIN, TIBC, IRON, RETICCTPCT in the last 72 hours. Urine analysis:    Component Value Date/Time   LABSPEC 1.015 11/13/2017 1209   BILIRUBINUR negative 11/13/2017 1209   BILIRUBINUR neg 09/07/2015 1445   KETONESUR negative 11/13/2017 1209   PROTEINUR negative 11/13/2017 1209   PROTEINUR 1+ 0.3 09/07/2015 1445   UROBILINOGEN 4.0 09/07/2015 1445   NITRITE Negative 11/13/2017 1209   NITRITE neg 09/07/2015 1445   LEUKOCYTESUR Negative 11/13/2017 1209   Sepsis Labs: @LABRCNTIP (procalcitonin:4,lacticidven:4)  )No results found for this or any previous visit (from the past 240 hour(s)).       Radiology Studies: Ct Abdomen Pelvis Wo Contrast  Result Date: 11/14/2017 CLINICAL DATA:  Intermittent generalized abdominal pain with nausea, vomiting and diarrhea for 6 weeks. Acute anemia, renal failure and guaiac-positive stool EXAM: CT ABDOMEN AND PELVIS WITHOUT CONTRAST TECHNIQUE: Multidetector CT imaging of the abdomen and pelvis was performed following the standard protocol without  IV contrast. COMPARISON:  None. FINDINGS: Lower chest: No acute abnormality. Hepatobiliary: No focal liver abnormality is seen. No gallstones, gallbladder wall thickening, or biliary dilatation. Pancreas: Unremarkable. No pancreatic ductal dilatation or surrounding inflammatory changes. Spleen: Normal in size without focal abnormality. Adrenals/Urinary Tract: Adrenal glands are unremarkable. Bilateral hydronephrosis, moderate to severe in degree. Irregular thickening of the posterior walls of the bladder, the likely source of the bilateral hydroureter and hydronephrosis. Stomach/Bowel: Bowel is normal in caliber. No bowel wall thickening or evidence of bowel wall inflammation seen. Appendix is normal. Vascular/Lymphatic: Conglomerate lymphadenopathy throughout the retroperitoneum and along the bilateral iliac chain regions. No significant vascular abnormality. Reproductive: Prostate is unremarkable. Other: Periureteral fluid stranding. No circumscribed fluid collection or abscess like collection. No free intraperitoneal air. Musculoskeletal: Sclerotic focus at the anterior aspects of the L3 vertebral body. Osseous structures otherwise unremarkable. IMPRESSION: 1. Bilateral hydronephrosis, moderate to severe in degree, most likely caused by the irregular thickening seen along the posterior bladder walls with presumed obstruction at the bilateral UVJs. This is highly suspicious for neoplastic bladder wall thickening. Recommend urology consultation for possible cystoscopy. Percutaneous nephrostomy (or nephrostomies) may also be needed. 2. Conglomerate lymphadenopathy throughout the retroperitoneum and bilateral iliac chain regions, metastatic lymphadenopathy  versus lymphoma. Consider tissue sampling. 3. Single sclerotic focus at the anterior aspects of the L3 vertebral body. This is most likely a benign bone island given the single site, however, early osseous metastasis cannot be excluded. Would consider nonemergent  MRI or nuclear medicine bone scan for further characterization. Electronically Signed   By: Franki Cabot M.D.   On: 11/14/2017 22:06   Ir Nephrostomy Placement Left  Result Date: 11/15/2017 INDICATION: Concern for bladder cancer, now with obstructive bilateral uropathy. Request made for placement of bilateral percutaneous nephrostomy catheters for renal preservation purposes. EXAM: 1. ULTRASOUND GUIDANCE FOR PUNCTURE OF THE BILATERAL RENAL COLLECTING SYSTEMS 2. BILATERAL PERCUTANEOUS NEPHROSTOMY TUBE PLACEMENT. COMPARISON:  CT abdomen pelvis - 11/14/2017 MEDICATIONS: Rocephin 2 g IV;; the antibiotic was administered in an appropriate time frame prior to skin puncture. ANESTHESIA/SEDATION: Moderate (conscious) sedation was employed during this procedure. A total of Versed 3 mg and Fentanyl 175 mcg was administered intravenously. Moderate Sedation Time: 37 minutes. The patient's level of consciousness and vital signs were monitored continuously by radiology nursing throughout the procedure under my direct supervision. CONTRAST:  A total of approximately 20 mL Isovue 300 was administered into both renal collecting systems FLUOROSCOPY TIME:  5 minutes, 18 seconds (456 mGy) COMPLICATIONS: None immediate. PROCEDURE: The procedure, risks, benefits, and alternatives were explained to the patient. Questions regarding the procedure were encouraged and answered. The patient understands and consents to the procedure. A timeout was performed prior to the initiation of the procedure. The bilateral flanks were prepped and draped in usual sterile fashion with a sterile drape was applied covering the operative field. A sterile gown and sterile gloves were used for the procedure. Local anesthesia was provided with 1% Lidocaine with epinephrine. Ultrasound was used to localize the left kidney. Under direct ultrasound guidance, a 21 gauge needle was advanced into the renal collecting system. An ultrasound image documentation was  performed. Access within the collecting system was confirmed with the efflux of urine followed by contrast injection. Over a Nitrex wire, the tract was dilated with an Accustick stent. Contrast injection confirmed appropriate positioning. Next, over a short Amplatz wire and under intermittent fluoroscopic guidance, the track was dilated allowing placement of a 10-French percutaneous nephrostomy catheter with end coiled and locked within the left renal pelvis. Contrast was injected and several sport radiographs were obtained in various obliquities confirming access. The catheter was secured at the skin with a Prolene retention suture and a gravity bag was placed. The identical procedure was then performed for the right kidney ultimately allowing placement of a 10 French percutaneous nephrostomy catheter via a right posterior inferior calyx with and coiled and locked within right renal pelvis. Both nephrostomy catheters were connected to gravity bags and dressings were placed. Patient tolerated the procedure well without immediate postprocedural complication. FINDINGS: Ultrasound scanning demonstrates a moderate to severely dilated collecting systems bilaterally. Under direct ultrasound guidance, posterior inferior calices were targeted bilaterally allowing placement of 10-French percutaneous nephrostomy catheters under intermittent fluoroscopic guidance. Bilateral percutaneous nephrostomy catheters are coiled within the bilateral renal pelvis ease. Contrast injection confirmed appropriate positioning. IMPRESSION: Successful ultrasound and fluoroscopic guided placement of bilateral 10 French percutaneous nephrostomy catheters. PLAN: - Maintain bilateral nephrostomy catheters to gravity bags. Recommend Q shift flushing with approximately 10 cc of saline until urine is no longer blood tinged. - Would allow for 2-3 weeks of track maturation prior to proceeding with definitive bilateral nephrostograms and attempted  placement of antegrade ureteral stents. Electronically Signed   By:  Sandi Mariscal M.D.   On: 11/15/2017 12:54   Ir Nephrostomy Placement Right  Result Date: 11/15/2017 Sandi Mariscal, MD     11/15/2017 11:51 AM Pre Procedure Dx: Hydronephrosis Post Procedural Dx: Same Successful [place] and fluoroscopic guided placement of bilateral 10 Fr PCNs with ends coiled and locked within the bilateral renal pelvis. Both PCNs connected to gravity bags. EBL: Minimal Complications: None immediate. Ronny Bacon, MD Pager #: [phone]         Scheduled Meds: . fentaNYL      . fentaNYL      . lidocaine (PF)      . lidocaine      . midazolam      . midazolam      . multivitamin with minerals  1 tablet Oral Daily  . pantoprazole (PROTONIX) IV  40 mg Intravenous Q12H  . sodium chloride flush  5 mL Intracatheter Q8H   Continuous Infusions: . sodium chloride    . [START ON 11/16/2017] cefTRIAXone (ROCEPHIN)  IV Stopped (11/15/17 1132)     LOS: 1 day    Time spent: 16min    Domenic Polite, MD [facility] Page via [email or web address], password [email or web address] After 7PM please contact night-coverage  11/15/2017, 1:09 PM

## 2017-11-15 NOTE — Procedures (Signed)
Pre Procedure Dx: Hydronephrosis Post Procedural Dx: Same  Successful [place] and fluoroscopic guided placement of bilateral 10 Fr PCNs with ends coiled and locked within the bilateral renal pelvis. Both PCNs connected to gravity bags.  EBL: Minimal  Complications: None immediate.  Ronny Bacon, MD Pager #: [phone]

## 2017-11-15 NOTE — Consult Note (Signed)
[facility] Consult  Referring Provider: Domenic Polite, MD Primary Care Physician:  [name], MD Primary Gastroenterologist: Althia Forts  Reason for Consultation:  Anemia, dark stools  HPI: Devon Cook is a 57 y.o. male was admitted with difficulty voiding for the last 1-2 months, renal impairment, bilateral hydronephrosis and underwent IR guided nephrostomy placement today. On admission he was found to have a hemoglobin of 6.8, MCV of 73 and had been having dark stools for last several weeks. Patient takes a baby aspirin and NSAIDs intermittently. No prior endoscopy or colonoscopy. Normally reports 1 bowel movement today denies bloody bowel movements. Denies unintentional weight loss, loss of appetite, difficulty swallowing, pain on swallowing, early satiety or bloating. He reports mild acid reflux not requiring any medications. Has history of hepatitis C genotype 1B, viral load 1.7 million international units per mL as of 05/2016, treated with heartburn E and it CV was not detected on labs from 10/10/17. CAT scan of the abdomen on admission showed a normal liver, normal pancreas, normal stomach and bowel.bilateral hydronephrosis, moderate to severe, irregular thickening along the bladder wall, suspicious for neoplastic bladder wall thickening along with lymphadenopathy and retroperitoneum.   Past Medical History:  Diagnosis Date  . Allergy   . ED (erectile dysfunction)   . Herpes genitalis in men   . Hypertension 10/2004  . Impaired fasting glucose 10/2004  . Obesity     Past Surgical History:  Procedure Laterality Date  . IR NEPHROSTOMY PLACEMENT LEFT  11/15/2017  . IR NEPHROSTOMY PLACEMENT RIGHT  11/15/2017  . TRICEPS TENDON REPAIR  08/2005    Prior to Admission medications   Medication Sig Start Date End Date Taking? Authorizing Provider  lisinopril-hydrochlorothiazide (PRINZIDE,ZESTORETIC) 20-25 MG tablet Take 1 tablet by mouth daily. 11/13/17  Yes  [name], MD  Multiple Vitamins-Minerals (MULTIVITAMIN WITH MINERALS) tablet Take 1 tablet by mouth daily.     Yes [provider]  ciprofloxacin (CIPRO) 500 MG tablet Take 1 tablet (500 mg total) by mouth 2 (two) times daily. Patient not taking: Reported on 11/13/2017 10/11/17   [name], MD  Ledipasvir-Sofosbuvir (HARVONI) 90-400 MG TABS Take 1 tablet by mouth daily. Patient not taking: Reported on 09/25/2017 11/18/15   Thayer Headings, MD    Current Facility-Administered Medications  Medication Dose Route Frequency Provider Last Rate Last Dose  . 0.9 %  sodium chloride infusion   Intravenous Continuous Elwyn Reach, MD 100 mL/hr at 11/15/17 1435    . acetaminophen (TYLENOL) tablet 650 mg  650 mg Oral Q6H PRN Elwyn Reach, MD       Or  . acetaminophen (TYLENOL) suppository 650 mg  650 mg Rectal Q6H PRN Gala Romney L, MD      . calcium carbonate (dosed in mg elemental calcium) suspension 500 mg of elemental calcium  500 mg of elemental calcium Oral Q6H PRN Elwyn Reach, MD      . camphor-menthol (SARNA) lotion 1 application  1 application Topical M5Y PRN Elwyn Reach, MD       And  . hydrOXYzine (ATARAX/VISTARIL) tablet 25 mg  25 mg Oral Q8H PRN Elwyn Reach, MD      . Derrill Memo ON 11/16/2017] cefTRIAXone (ROCEPHIN) 2 g in sodium chloride 0.9 % 100 mL IVPB  2 g Intravenous to XRAY Allred, Darrell K, PA-C   Stopped at 11/15/17 1132  . docusate sodium (ENEMEEZ) enema 283 mg  1 enema Rectal PRN Elwyn Reach, MD      .  feeding supplement (NEPRO CARB STEADY) liquid 237 mL  237 mL Oral TID PRN Gala Romney L, MD      . fentaNYL (SUBLIMAZE) 100 MCG/2ML injection           . fentaNYL (SUBLIMAZE) 100 MCG/2ML injection           . hydrALAZINE (APRESOLINE) injection 10 mg  10 mg Intravenous Q6H PRN Domenic Polite, MD      . lidocaine (PF) (XYLOCAINE) 1 % injection           . lidocaine (XYLOCAINE) 1 % (with pres) injection           . midazolam  (VERSED) 2 MG/2ML injection           . midazolam (VERSED) 2 MG/2ML injection           . morphine 2 MG/ML injection 1 mg  1 mg Intravenous Q3H PRN Domenic Polite, MD   1 mg at 11/15/17 1434  . multivitamin with minerals tablet 1 tablet  1 tablet Oral Daily Garba, Mohammad L, MD      . ondansetron (ZOFRAN) tablet 4 mg  4 mg Oral Q6H PRN Elwyn Reach, MD       Or  . ondansetron (ZOFRAN) injection 4 mg  4 mg Intravenous Q6H PRN Gala Romney L, MD      . pantoprazole (PROTONIX) injection 40 mg  40 mg Intravenous Q12H Domenic Polite, MD   40 mg at 11/15/17 1435  . sodium chloride flush (NS) 0.9 % injection 5 mL  5 mL Intracatheter Q8H Watts, Jenny Reichmann, MD      . sorbitol 70 % solution 30 mL  30 mL Oral PRN Gala Romney L, MD      . zolpidem (AMBIEN) tablet 5 mg  5 mg Oral QHS PRN Elwyn Reach, MD        Allergies as of 11/14/2017  . (No Known Allergies)    History reviewed. No pertinent family history.  Social History   Socioeconomic History  . Marital status: Married    Spouse name: Not on file  . Number of children: Not on file  . Years of education: Not on file  . Highest education level: Not on file  Occupational History  . Not on file  Social Needs  . Financial resource strain: Not on file  . Food insecurity:    Worry: Not on file    Inability: Not on file  . Transportation needs:    Medical: Not on file    Non-medical: Not on file  Tobacco Use  . Smoking status: Current Some Day Smoker  . Smokeless tobacco: Never Used  Substance and Sexual Activity  . Alcohol use: Yes    Comment: 2 drinks per weekend  . Drug use: No  . Sexual activity: Yes  Lifestyle  . Physical activity:    Days per week: Not on file    Minutes per session: Not on file  . Stress: Not on file  Relationships  . Social connections:    Talks on phone: Not on file    Gets together: Not on file    Attends religious service: Not on file    Active member of club or organization: Not on  file    Attends meetings of clubs or organizations: Not on file    Relationship status: Not on file  . Intimate partner violence:    Fear of current or ex partner: Not on file    Emotionally  abused: Not on file    Physically abused: Not on file    Forced sexual activity: Not on file  Other Topics Concern  . Not on file  Social History Narrative  . Not on file    Review of Systems: Positive for: GI: Described in detail in HPI.    Gen: Denies any fever, chills, rigors, night sweats, anorexia, fatigue, weakness, malaise, involuntary weight loss, and sleep disorder CV: Denies chest pain, angina, palpitations, syncope, orthopnea, PND, peripheral edema, and claudication. Resp: Denies dyspnea, cough, sputum, wheezing, coughing up blood. GU : urinary burning, blood in urine, urinary hesitancy, Denies urinary frequency, nocturnal urination, and urinary incontinence. MS: Denies joint pain or swelling.  Denies muscle weakness, cramps, atrophy.  Derm: Denies rash, itching, oral ulcerations, hives, unhealing ulcers.  Psych: Denies depression, anxiety, memory loss, suicidal ideation, hallucinations,  and confusion. Heme: Denies bruising, bleeding, and enlarged lymph nodes. Neuro:  Denies any headaches, dizziness, paresthesias. Endo:  Denies any problems with DM, thyroid, adrenal function.  Physical Exam: Vital signs in last 24 hours: Temp:  [97.5 F (36.4 C)-98.7 F (37.1 C)] 97.5 F (36.4 C) (05/15 1321) Pulse Rate:  [60-93] 77 (05/15 1215) Resp:  [13-29] 17 (05/15 1215) BP: (126-195)/(61-128) 170/97 (05/15 1215) SpO2:  [92 %-100 %] 98 % (05/15 1150) Weight:  [136.4 kg (300 lb 11.3 oz)] 136.4 kg (300 lb 11.3 oz) (05/15 0605) Last BM Date: 11/14/17  General:   Alert,  Well-developed, well-nourished, pleasant and cooperative in NAD,in mild distress from pain with recent nephrostomy tube placement Head:  Normocephalic and atraumatic. Eyes:  Sclera clear, no icterus.   Prominent  pallor Ears:  Normal auditory acuity. Nose:  No deformity, discharge,  or lesions. Mouth:  No deformity or lesions.  Oropharynx pink & moist. Neck:  Supple; no masses or thyromegaly. Lungs:  Clear throughout to auscultation.   No wheezes, crackles, or rhonchi. No acute distress. Heart:  Regular rate and rhythm; no murmurs, clicks, rubs,  or gallops. Extremities:  Without clubbing or edema. Neurologic:  Alert and  oriented x4;  grossly normal neurologically. Skin:  Intact without significant lesions or rashes. Psych:  Alert and cooperative. Normal mood and affect. Abdomen: bilateral nephrostomy drainage noted with bloody output, Soft, nontender and nondistended. No masses, hepatosplenomegaly or hernias noted. Normal bowel sounds.          Lab Results: Recent Labs    11/13/17 0959 11/14/17 1927 11/15/17 0022  WBC 12.3* 11.2* 11.5*  HGB 6.8* 6.8* 6.1*  HCT 20.2* 19.5* 17.4*  PLT 253 264 243   BMET Recent Labs    11/13/17 0959 11/14/17 1927 11/15/17 0022  NA 140 137 137  K 5.0 4.3 4.1  CL 104 105 105  CO2 12* 15* 13*  GLUCOSE 72 93 87  BUN 125* 155* 158*  CREATININE 16.83* 17.40* 18.63*  CALCIUM 8.9 9.3 8.9   LFT Recent Labs    11/15/17 0022  PROT 7.5  ALBUMIN 3.2*  AST 11*  ALT 11*  ALKPHOS 86  BILITOT 0.4   PT/INR Recent Labs    11/14/17 1927  LABPROT 15.6*  INR 1.25    Studies/Results: Ct Abdomen Pelvis Wo Contrast  Result Date: 11/14/2017 CLINICAL DATA:  Intermittent generalized abdominal pain with nausea, vomiting and diarrhea for 6 weeks. Acute anemia, renal failure and guaiac-positive stool EXAM: CT ABDOMEN AND PELVIS WITHOUT CONTRAST TECHNIQUE: Multidetector CT imaging of the abdomen and pelvis was performed following the standard protocol without IV contrast. COMPARISON:  None.  FINDINGS: Lower chest: No acute abnormality. Hepatobiliary: No focal liver abnormality is seen. No gallstones, gallbladder wall thickening, or biliary dilatation. Pancreas:  Unremarkable. No pancreatic ductal dilatation or surrounding inflammatory changes. Spleen: Normal in size without focal abnormality. Adrenals/Urinary Tract: Adrenal glands are unremarkable. Bilateral hydronephrosis, moderate to severe in degree. Irregular thickening of the posterior walls of the bladder, the likely source of the bilateral hydroureter and hydronephrosis. Stomach/Bowel: Bowel is normal in caliber. No bowel wall thickening or evidence of bowel wall inflammation seen. Appendix is normal. Vascular/Lymphatic: Conglomerate lymphadenopathy throughout the retroperitoneum and along the bilateral iliac chain regions. No significant vascular abnormality. Reproductive: Prostate is unremarkable. Other: Periureteral fluid stranding. No circumscribed fluid collection or abscess like collection. No free intraperitoneal air. Musculoskeletal: Sclerotic focus at the anterior aspects of the L3 vertebral body. Osseous structures otherwise unremarkable. IMPRESSION: 1. Bilateral hydronephrosis, moderate to severe in degree, most likely caused by the irregular thickening seen along the posterior bladder walls with presumed obstruction at the bilateral UVJs. This is highly suspicious for neoplastic bladder wall thickening. Recommend urology consultation for possible cystoscopy. Percutaneous nephrostomy (or nephrostomies) may also be needed. 2. Conglomerate lymphadenopathy throughout the retroperitoneum and bilateral iliac chain regions, metastatic lymphadenopathy versus lymphoma. Consider tissue sampling. 3. Single sclerotic focus at the anterior aspects of the L3 vertebral body. This is most likely a benign bone island given the single site, however, early osseous metastasis cannot be excluded. Would consider nonemergent MRI or nuclear medicine bone scan for further characterization. Electronically Signed   By: Franki Cabot M.D.   On: 11/14/2017 22:06   Ir Nephrostomy Placement Left  Result Date: 11/15/2017 INDICATION:  Concern for bladder cancer, now with obstructive bilateral uropathy. Request made for placement of bilateral percutaneous nephrostomy catheters for renal preservation purposes. EXAM: 1. ULTRASOUND GUIDANCE FOR PUNCTURE OF THE BILATERAL RENAL COLLECTING SYSTEMS 2. BILATERAL PERCUTANEOUS NEPHROSTOMY TUBE PLACEMENT. COMPARISON:  CT abdomen pelvis - 11/14/2017 MEDICATIONS: Rocephin 2 g IV;; the antibiotic was administered in an appropriate time frame prior to skin puncture. ANESTHESIA/SEDATION: Moderate (conscious) sedation was employed during this procedure. A total of Versed 3 mg and Fentanyl 175 mcg was administered intravenously. Moderate Sedation Time: 37 minutes. The patient's level of consciousness and vital signs were monitored continuously by radiology nursing throughout the procedure under my direct supervision. CONTRAST:  A total of approximately 20 mL Isovue 300 was administered into both renal collecting systems FLUOROSCOPY TIME:  5 minutes, 18 seconds (161 mGy) COMPLICATIONS: None immediate. PROCEDURE: The procedure, risks, benefits, and alternatives were explained to the patient. Questions regarding the procedure were encouraged and answered. The patient understands and consents to the procedure. A timeout was performed prior to the initiation of the procedure. The bilateral flanks were prepped and draped in usual sterile fashion with a sterile drape was applied covering the operative field. A sterile gown and sterile gloves were used for the procedure. Local anesthesia was provided with 1% Lidocaine with epinephrine. Ultrasound was used to localize the left kidney. Under direct ultrasound guidance, a 21 gauge needle was advanced into the renal collecting system. An ultrasound image documentation was performed. Access within the collecting system was confirmed with the efflux of urine followed by contrast injection. Over a Nitrex wire, the tract was dilated with an Accustick stent. Contrast injection  confirmed appropriate positioning. Next, over a short Amplatz wire and under intermittent fluoroscopic guidance, the track was dilated allowing placement of a 10-French percutaneous nephrostomy catheter with end coiled and locked within the left renal  pelvis. Contrast was injected and several sport radiographs were obtained in various obliquities confirming access. The catheter was secured at the skin with a Prolene retention suture and a gravity bag was placed. The identical procedure was then performed for the right kidney ultimately allowing placement of a 10 French percutaneous nephrostomy catheter via a right posterior inferior calyx with and coiled and locked within right renal pelvis. Both nephrostomy catheters were connected to gravity bags and dressings were placed. Patient tolerated the procedure well without immediate postprocedural complication. FINDINGS: Ultrasound scanning demonstrates a moderate to severely dilated collecting systems bilaterally. Under direct ultrasound guidance, posterior inferior calices were targeted bilaterally allowing placement of 10-French percutaneous nephrostomy catheters under intermittent fluoroscopic guidance. Bilateral percutaneous nephrostomy catheters are coiled within the bilateral renal pelvis ease. Contrast injection confirmed appropriate positioning. IMPRESSION: Successful ultrasound and fluoroscopic guided placement of bilateral 10 French percutaneous nephrostomy catheters. PLAN: - Maintain bilateral nephrostomy catheters to gravity bags. Recommend Q shift flushing with approximately 10 cc of saline until urine is no longer blood tinged. - Would allow for 2-3 weeks of track maturation prior to proceeding with definitive bilateral nephrostograms and attempted placement of antegrade ureteral stents. Electronically Signed   By: Sandi Mariscal M.D.   On: 11/15/2017 12:54   Ir Nephrostomy Placement Right  Result Date: 11/15/2017 Sandi Mariscal, MD     11/15/2017 11:51 AM  Pre Procedure Dx: Hydronephrosis Post Procedural Dx: Same Successful [place] and fluoroscopic guided placement of bilateral 10 Fr PCNs with ends coiled and locked within the bilateral renal pelvis. Both PCNs connected to gravity bags. EBL: Minimal Complications: None immediate. Ronny Bacon, MD Pager #: [phone]     Impression: 1.Anemia, FOBT positive stool, dark stools. 2. ARF,bilateral hydronephrosis,suspected bladder cancer  Plan: I had a long discussion with patient regarding diagnostic endoscopy. Patient does not want to pursue an endoscopy with ongoing renal issues and possible bladder cancer. He is not tachycardic or hypotensive, does not appear to have active GI bleeding. Recommend treating with PPI twice a day, and PRBC transfusion as needed. Patient also has never had a screening colonoscopy. Recommend both EGD and a screening colonoscopy as an outpatient after resolution of his ongoing renal issues. Discussed with patient's hospitalist Dr. Broadus John. Patient verbalizes understanding and consents with above.   LOS: 1 day   Ronnette Juniper, M.D.  11/15/2017, 3:12 PM  Pager [phone] If no answer or after 5 PM call [phone]

## 2017-11-15 NOTE — Consult Note (Signed)
Chief Complaint: Patient was seen in consultation today for bilateral percutaneous nephrostomies Chief Complaint  Patient presents with  . Anemia    Referring Physician(s): Manning,S  Supervising Physician: Sandi Mariscal  Patient Status: [facility] - In-pt  History of Present Illness: Devon Cook is a 57 y.o. male smoker with history of hypertension, hepatitis C, obesity, prior prostatitis and recently treated UTI who presented to [facility] yesterday with difficulty voiding as well as right lower extremity swelling, occasional dizziness, hematuria and nausea.  He was found to be anemic with guaiac positive stool, elevated creatinine and CT scan revealing bilateral hydronephrosis, most likely caused by irregular thickening seen along the posterior bladder walls with presumed obstruction at the bilateral UVJs.  Also noted was conglomerate lymphadenopathy throughout the retroperitoneum and bilateral iliac chain regions concerning for metastatic disease versus lymphoma.  Current labs include WBC 11.5, hemoglobin 6.1, platelets 243k, creatinine 18.63, PT 15.6, INR 1.25.  Patient is currently receiving blood transfusion.  Request now received from urology for bilateral percutaneous nephrostomy tube placements.  Past Medical History:  Diagnosis Date  . Allergy   . ED (erectile dysfunction)   . Herpes genitalis in men   . Hypertension 10/2004  . Impaired fasting glucose 10/2004  . Obesity     Past Surgical History:  Procedure Laterality Date  . TRICEPS TENDON REPAIR  08/2005    Allergies: Patient has no known allergies.  Medications: Prior to Admission medications   Medication Sig Start Date End Date Taking? Authorizing Provider  lisinopril-hydrochlorothiazide (PRINZIDE,ZESTORETIC) 20-25 MG tablet Take 1 tablet by mouth daily. 11/13/17  Yes [name], MD  Multiple Vitamins-Minerals (MULTIVITAMIN WITH MINERALS) tablet Take 1 tablet by mouth daily.     Yes [provider]  ciprofloxacin (CIPRO) 500 MG tablet Take 1 tablet (500 mg total) by mouth 2 (two) times daily. Patient not taking: Reported on 11/13/2017 10/11/17   [name], MD  Ledipasvir-Sofosbuvir (HARVONI) 90-400 MG TABS Take 1 tablet by mouth daily. Patient not taking: Reported on 09/25/2017 11/18/15   Thayer Headings, MD     History reviewed. No pertinent family history.  Social History   Socioeconomic History  . Marital status: Married    Spouse name: Not on file  . Number of children: Not on file  . Years of education: Not on file  . Highest education level: Not on file  Occupational History  . Not on file  Social Needs  . Financial resource strain: Not on file  . Food insecurity:    Worry: Not on file    Inability: Not on file  . Transportation needs:    Medical: Not on file    Non-medical: Not on file  Tobacco Use  . Smoking status: Current Some Day Smoker  . Smokeless tobacco: Never Used  Substance and Sexual Activity  . Alcohol use: Yes    Comment: 2 drinks per weekend  . Drug use: No  . Sexual activity: Yes  Lifestyle  . Physical activity:    Days per week: Not on file    Minutes per session: Not on file  . Stress: Not on file  Relationships  . Social connections:    Talks on phone: Not on file    Gets together: Not on file    Attends religious service: Not on file    Active member of club or organization: Not on file    Attends meetings of clubs or organizations: Not on file  Relationship status: Not on file  Other Topics Concern  . Not on file  Social History Narrative  . Not on file      Review of Systems see above; he denies fever, headache, chest pain, dyspnea, cough, worsening abdominal/back pain, vomiting.  Vital Signs: BP (!) 170/97 (BP Location: Left Arm)   Pulse 73   Temp 98 F (36.7 C) (Oral)   Resp 14   Ht 6' (1.829 m)   Wt (!) 300 lb 11.3 oz (136.4 kg)   SpO2 100%   BMI 40.78 kg/m   Physical Exam awake, alert.   Complaining of irritation at Foley catheter insertion site with noted hematuria; chest clear to auscultation bilaterally.  Heart with regular rate and rhythm.  Abdomen soft, positive bowel sounds, currently nontender.  Significant right lower extremity edema noted.  Imaging: Ct Abdomen Pelvis Wo Contrast  Result Date: 11/14/2017 CLINICAL DATA:  Intermittent generalized abdominal pain with nausea, vomiting and diarrhea for 6 weeks. Acute anemia, renal failure and guaiac-positive stool EXAM: CT ABDOMEN AND PELVIS WITHOUT CONTRAST TECHNIQUE: Multidetector CT imaging of the abdomen and pelvis was performed following the standard protocol without IV contrast. COMPARISON:  None. FINDINGS: Lower chest: No acute abnormality. Hepatobiliary: No focal liver abnormality is seen. No gallstones, gallbladder wall thickening, or biliary dilatation. Pancreas: Unremarkable. No pancreatic ductal dilatation or surrounding inflammatory changes. Spleen: Normal in size without focal abnormality. Adrenals/Urinary Tract: Adrenal glands are unremarkable. Bilateral hydronephrosis, moderate to severe in degree. Irregular thickening of the posterior walls of the bladder, the likely source of the bilateral hydroureter and hydronephrosis. Stomach/Bowel: Bowel is normal in caliber. No bowel wall thickening or evidence of bowel wall inflammation seen. Appendix is normal. Vascular/Lymphatic: Conglomerate lymphadenopathy throughout the retroperitoneum and along the bilateral iliac chain regions. No significant vascular abnormality. Reproductive: Prostate is unremarkable. Other: Periureteral fluid stranding. No circumscribed fluid collection or abscess like collection. No free intraperitoneal air. Musculoskeletal: Sclerotic focus at the anterior aspects of the L3 vertebral body. Osseous structures otherwise unremarkable. IMPRESSION: 1. Bilateral hydronephrosis, moderate to severe in degree, most likely caused by the irregular thickening seen  along the posterior bladder walls with presumed obstruction at the bilateral UVJs. This is highly suspicious for neoplastic bladder wall thickening. Recommend urology consultation for possible cystoscopy. Percutaneous nephrostomy (or nephrostomies) may also be needed. 2. Conglomerate lymphadenopathy throughout the retroperitoneum and bilateral iliac chain regions, metastatic lymphadenopathy versus lymphoma. Consider tissue sampling. 3. Single sclerotic focus at the anterior aspects of the L3 vertebral body. This is most likely a benign bone island given the single site, however, early osseous metastasis cannot be excluded. Would consider nonemergent MRI or nuclear medicine bone scan for further characterization. Electronically Signed   By: Franki Cabot M.D.   On: 11/14/2017 22:06    Labs:  CBC: Recent Labs    11/13/17 0959 11/14/17 1927 11/15/17 0022  WBC 12.3* 11.2* 11.5*  HGB 6.8* 6.8* 6.1*  HCT 20.2* 19.5* 17.4*  PLT 253 264 243    COAGS: Recent Labs    11/14/17 1927  INR 1.25    BMP: Recent Labs    11/13/17 0959 11/14/17 1927 11/15/17 0022  NA 140 137 137  K 5.0 4.3 4.1  CL 104 105 105  CO2 12* 15* 13*  GLUCOSE 72 93 87  BUN 125* 155* 158*  CALCIUM 8.9 9.3 8.9  CREATININE 16.83* 17.40* 18.63*  GFRNONAA 3* 3* 2*  GFRAA 3* 3* 3*    LIVER FUNCTION TESTS: Recent Labs  11/13/17 0959 11/14/17 1927 11/15/17 0022  BILITOT <0.2 0.7 0.4  AST 14 11* 11*  ALT 9 13* 11*  ALKPHOS 109 92 86  PROT 7.4 8.2* 7.5  ALBUMIN 3.9 3.5 3.2*    TUMOR MARKERS: No results for input(s): AFPTM, CEA, CA199, CHROMGRNA in the last 8760 hours.  Assessment and Plan: 57 y.o. male smoker with history of hypertension, hepatitis C, obesity, prior prostatitis and recently treated UTI who presented to [facility] yesterday with difficulty voiding as well as right lower extremity swelling, occasional dizziness, hematuria and nausea.  He was found to be anemic with guaiac positive  stool, elevated creatinine and CT scan revealing bilateral hydronephrosis, most likely caused by irregular thickening seen along the posterior bladder walls with presumed obstruction at the bilateral UVJs.  Also noted was conglomerate lymphadenopathy throughout the retroperitoneum and bilateral iliac chain regions concerning for metastatic disease versus lymphoma.  Current labs include WBC 11.5, hemoglobin 6.1, platelets 243k, creatinine 18.63, PT 15.6, INR 1.25.  Patient is currently receiving blood transfusion.  Request now received from urology for bilateral percutaneous nephrostomy tube placements.  Imaging studies have been reviewed by Dr. Pascal Lux.Risks and benefits of procedure were discussed with the patient/spouse including, but not limited to, infection, bleeding, significant bleeding causing loss or decrease in renal function or damage to adjacent structures.   All of the patient's questions were answered, patient is agreeable to proceed.  Consent signed and in chart.  Procedure scheduled for today.    Thank you for this interesting consult.  I greatly enjoyed meeting TEOFIL MANIACI and look forward to participating in their care.  A copy of this report was sent to the requesting provider on this date.  Electronically Signed: D. Rowe Robert, PA-C 11/15/2017, 10:09 AM   I spent a total of  40 minutes   in face to face in clinical consultation, greater than 50% of which was counseling/coordinating care for bilateral percutaneous nephrostomy tube placements

## 2017-11-15 NOTE — Consult Note (Addendum)
Renal Service Consult Note [facility]  Devon Cook 11/15/2017 [name] Requesting Physician: Vernona Rieger  Reason for Consult:  Acute renal failure w bilat hydro HPI: The patient is a 57 y.o. year-old with hx of hep C and HTN presented to ED on 5/14 sent from PCP for abnormal kidney labs w creat 17.  Pt had episode of prostatitis last year and recently rec'd po Cipro last month for UTI/ urinary symptoms.  In ED Hb was 6.8 , creat 17 and CT abdomen showed bilat hydronephrosis and significant bladder wall thickening and intra-abd LAN. Patient went for bilat PCN tubes today per IR and is post-procedure now.  Asked to see for renal failure.   Pt denies hx of kidney failure in the past.  Takes occ nsaids.  Was on lisinopril at home.  No hematuria.  Now having post PCN bilat flank pain, no SOB, +edema of legs.  No hx heart disease.  Appetite has been very poor 2-3 wks but denies n/v.  No confusion.     ROS  denies CP  no joint pain   no HA  no blurry vision  no rash  no diarrhea  no nausea/ vomiting  no dysuria  no difficulty voiding  no change in urine color    Past Medical History  Past Medical History:  Diagnosis Date  . Allergy   . ED (erectile dysfunction)   . Herpes genitalis in men   . Hypertension 10/2004  . Impaired fasting glucose 10/2004  . Obesity    Past Surgical History  Past Surgical History:  Procedure Laterality Date  . IR NEPHROSTOMY PLACEMENT LEFT  11/15/2017  . IR NEPHROSTOMY PLACEMENT RIGHT  11/15/2017  . TRICEPS TENDON REPAIR  08/2005   Family History History reviewed. No pertinent family history. Social History  reports that he has been smoking.  He has never used smokeless tobacco. He reports that he drinks alcohol. He reports that he does not use drugs. Allergies No Known Allergies Home medications Prior to Admission medications   Medication Sig Start Date End Date Taking? Authorizing Provider  lisinopril-hydrochlorothiazide  (PRINZIDE,ZESTORETIC) 20-25 MG tablet Take 1 tablet by mouth daily. 11/13/17  Yes [name], MD  Multiple Vitamins-Minerals (MULTIVITAMIN WITH MINERALS) tablet Take 1 tablet by mouth daily.     Yes [provider]  ciprofloxacin (CIPRO) 500 MG tablet Take 1 tablet (500 mg total) by mouth 2 (two) times daily. Patient not taking: Reported on 11/13/2017 10/11/17   [name], MD  Ledipasvir-Sofosbuvir (HARVONI) 90-400 MG TABS Take 1 tablet by mouth daily. Patient not taking: Reported on 09/25/2017 11/18/15   Thayer Headings, MD   Liver Function Tests Recent Labs  Lab 11/13/17 0959 11/14/17 1927 11/15/17 0022  AST 14 11* 11*  ALT 9 13* 11*  ALKPHOS 109 92 86  BILITOT <0.2 0.7 0.4  PROT 7.4 8.2* 7.5  ALBUMIN 3.9 3.5 3.2*   No results for input(s): LIPASE, AMYLASE in the last 168 hours. CBC Recent Labs  Lab 11/13/17 0959 11/14/17 1927 11/15/17 0022  WBC 12.3* 11.2* 11.5*  NEUTROABS 8.3* 8.0* 8.1*  HGB 6.8* 6.8* 6.1*  HCT 20.2* 19.5* 17.4*  MCV 73* 69.1* 69.0*  PLT 253 264 161   Basic Metabolic Panel Recent Labs  Lab 11/13/17 0959 11/14/17 1927 11/15/17 0022  NA 140 137 137  K 5.0 4.3 4.1  CL 104 105 105  CO2 12* 15* 13*  GLUCOSE 72 93 87  BUN 125*  155* 158*  CREATININE 16.83* 17.40* 18.63*  CALCIUM 8.9 9.3 8.9  PHOS  --   --  9.2*   Iron/TIBC/Ferritin/ %Sat    Component Value Date/Time   IRON 191 (H) 10/20/2015 1354    Vitals:   11/15/17 1140 11/15/17 1150 11/15/17 1215 11/15/17 1321  BP: (!) 161/105 (!) 171/89 (!) 170/97   Pulse: 80 85 77   Resp: 15 15 17    Temp:   98 F (36.7 C) (!) 97.5 F (36.4 C)  TempSrc:   Oral Oral  SpO2: 99% 98%    Weight:      Height:       Exam Gen groggy post sedation No rash, cyanosis or gangrene Sclera anicteric, throat clear  No jvd or bruits Chest clear bilat to bases no rales or wheezing RRR no MRG Abd soft ntnd no mass or ascites +bs R and L flank PCN tubes draining bloody fluid in small amts GU  normal male MS no joint effusions or deformity Ext 2+ pitting bilat LE edema, no wounds or ulcers Neuro is lethargic but easy to arouse and Ox 3 nonfocal, no asterixis  Na 137 K 4.1 UBN 158 Cr 18  Ca 8.9  Phos 9.2   Alb 3.2  WBC11  Hb 6.1 UA 5/13 > negative   home meds:  lisinopril-HCTZ 20-25 qd  cipro 500 bid  MVI/ Harvoni  Impression: 1 Renal failure - bilat hydro w/ abnormal appearing bladder and LAN in abdomen, prob has some type of malignancy. Renal failure presumably acute due to obstruction but not sure how long he has been obstructed. Is making urine, 1.3 L out today.  No acute indication for dialysis as patient alert and oriented w/o gross uremia.  +Vol overloaded, will change NS to bicarb gtt and lower to 50/hr.  See if renal function will improve after PCN's placed today.  If not could need dialysis.  Have d/w patient.   2 Anemia - due to renal failure in part , poss malignancy, also guiac +.  3 Abnormal bladder by CT/ abdominal LAN - eval per primary 4  Vol overload - lungs are clear 5  Hx hep C 6  HTN - bp's high, IV hydral prn ordered, will add clonidine po tid for now   Plan - will follow  Kelly Splinter MD [place] pager [phone]   11/15/2017, 3:17 PM

## 2017-11-16 ENCOUNTER — Inpatient Hospital Stay (HOSPITAL_COMMUNITY): Payer: 59

## 2017-11-16 DIAGNOSIS — M7989 Other specified soft tissue disorders: Secondary | ICD-10-CM

## 2017-11-16 LAB — BASIC METABOLIC PANEL
Anion gap: 16 — ABNORMAL HIGH (ref 5–15)
BUN: 159 mg/dL — ABNORMAL HIGH (ref 6–20)
CALCIUM: 9 mg/dL (ref 8.9–10.3)
CO2: 17 mmol/L — AB (ref 22–32)
CREATININE: 19.1 mg/dL — AB (ref 0.61–1.24)
Chloride: 103 mmol/L (ref 101–111)
GFR, EST AFRICAN AMERICAN: 3 mL/min — AB (ref >60)
GFR, EST NON AFRICAN AMERICAN: 2 mL/min — AB (ref >60)
Glucose, Bld: 89 mg/dL (ref 65–99)
Potassium: 5 mmol/L (ref 3.5–5.1)
Sodium: 136 mmol/L (ref 135–145)

## 2017-11-16 LAB — PTH, INTACT AND CALCIUM
CALCIUM TOTAL (PTH): 8.9 mg/dL (ref 8.7–10.2)
PTH: 274 pg/mL — ABNORMAL HIGH (ref 15–65)

## 2017-11-16 LAB — CBC
HCT: 21.7 % — ABNORMAL LOW (ref 39.0–52.0)
Hemoglobin: 7.5 g/dL — ABNORMAL LOW (ref 13.0–17.0)
MCH: 25.3 pg — ABNORMAL LOW (ref 26.0–34.0)
MCHC: 34.6 g/dL (ref 30.0–36.0)
MCV: 73.1 fL — ABNORMAL LOW (ref 78.0–100.0)
PLATELETS: 239 10*3/uL (ref 150–400)
RBC: 2.97 MIL/uL — ABNORMAL LOW (ref 4.22–5.81)
RDW: 15.6 % — AB (ref 11.5–15.5)
WBC: 14.2 10*3/uL — ABNORMAL HIGH (ref 4.0–10.5)

## 2017-11-16 LAB — MRSA PCR SCREENING: MRSA BY PCR: NEGATIVE

## 2017-11-16 MED ORDER — PANTOPRAZOLE SODIUM 40 MG PO TBEC
40.0000 mg | DELAYED_RELEASE_TABLET | Freq: Two times a day (BID) | ORAL | Status: DC
Start: 2017-11-16 — End: 2017-12-02
  Administered 2017-11-16 – 2017-12-02 (×32): 40 mg via ORAL
  Filled 2017-11-16 (×33): qty 1

## 2017-11-16 NOTE — Progress Notes (Signed)
PROGRESS NOTE    Devon Cook  EVO:[id number] DOB: [birth year] DOA: 11/14/2017 PCP: [name], MD  Brief Narrative:57 year old male with history of hypertension and hep C admitted with malaise and difficulty voiding for 1-2 months, saw  PCP was treated with couple of rounds of antibiotics, had labs drawn yesterday which revealed a creatinine of 17 and was referred to the emergency room -CT revealed bilateral hydronephrosis at the level of the bladder with diffuse bladder wall thickening, bulky retroperitoneal lymphadenopathy -urology consulted, IR consulted, status post bilateral percutaneous nephrostomy tubes 5/15 a.m.   Assessment & Plan:   AKI (acute renal failure) (HCC) -postobstructive primarily, bilateral hydronephrosis, bladder CA suspected based on imaging -Was also on lisinopril and taking NSAIDs prior to admission -Baseline creatinine was normal in 2017 -Urology following, interventional radiology consulted -status post bilateral nephrostomy tubes 5/15 a.m, urine output of 3 L yesterday, unfortunately no change in creatinine yet -Monitor urine output and labs daily -renal and urology following  Bilateral severe hydronephrosis, bladder wall thickening and bulky retroperitoneal lymphadenopathy -All this is concerning for  Metastatic bladder CA -Urology following, will need cystoscopy and biopsy  Anemia -Acute, last labs show hemoglobin of 15 from 2017 -Suspect this is primarily secondary to possible underlying bladder CA -However patient also gives history of melena/black stools for few days, was Hemoccult positive in the emergency room, he uses NSAIDs at least a few times a week -appreciate gastroenterology input plan to defer GI workup at this time due to multitude of other active medical problems -monitor Hb  History of hepatitis C -status post Harvoni Rx  DVT prophylaxis: SCDs Code Status: Full code Family Communication: wife at bedside Disposition Plan:  pending above workup  Consultants:   IR  Renal  Urology   Procedures: Bilateral PCNs 5/15  Antimicrobials:    Subjective: -feels a bit tired, breathing ok, bladder spasms  Objective: Vitals:   11/16/17 0803 11/16/17 0946 11/16/17 0947 11/16/17 1207  BP: 106/62 125/74 125/74 109/71  Pulse:      Resp: 12  15 14   Temp: (!) 97.4 F (36.3 C)   (!) 97.4 F (36.3 C)  TempSrc: Oral   Oral  SpO2: 98%     Weight:      Height:        Intake/Output Summary (Last 24 hours) at 11/16/2017 1307 Last data filed at 11/16/2017 1200 Gross per 24 hour  Intake 2109.17 ml  Output 2700 ml  Net -590.83 ml   Filed Weights   11/15/17 0605  Weight: (!) 136.4 kg (300 lb 11.3 oz)    Examination:  General exam: Appears calm and comfortable, well-built male, somnolent after anesthesia Respiratory system: Clear to auscultation. Respiratory effort normal. Cardiovascular system: S1 & S2 heard, RRR. No JVD, murmurs, rubs, gallops Gastrointestinal system: Abdomen is nondistended, soft and nontender.Normal bowel sounds heard, bilateral percutaneous nephrostomy tubes Central nervous system:somnolent but arousable,. No focal neurological deficits. Extremities: Symmetric 5 x 5 power. Skin: No rashes, lesions or ulcers Psychiatry: Judgement and insight appear normal. Mood & affect appropriate.     Data Reviewed:   CBC: Recent Labs  Lab 11/13/17 0959 11/14/17 1927 11/15/17 0022 11/15/17 1507 11/16/17 0356  WBC 12.3* 11.2* 11.5*  --  14.2*  NEUTROABS 8.3* 8.0* 8.1*  --   --   HGB 6.8* 6.8* 6.1* 8.5* 7.5*  HCT 20.2* 19.5* 17.4* 24.1* 21.7*  MCV 73* 69.1* 69.0*  --  73.1*  PLT 253 264 243  --  239  Basic Metabolic Panel: Recent Labs  Lab 11/13/17 0959 11/14/17 1927 11/15/17 0022 11/16/17 0356  NA 140 137 137 136  K 5.0 4.3 4.1 5.0  CL 104 105 105 103  CO2 12* 15* 13* 17*  GLUCOSE 72 93 87 89  BUN 125* 155* 158* 159*  CREATININE 16.83* 17.40* 18.63* 19.10*  CALCIUM 8.9 9.3  8.9  8.9 9.0  PHOS  --   --  9.2*  --    GFR: Estimated Creatinine Clearance: 6.2 mL/min (A) (by C-G formula based on SCr of 19.1 mg/dL (H)). Liver Function Tests: Recent Labs  Lab 11/13/17 0959 11/14/17 1927 11/15/17 0022  AST 14 11* 11*  ALT 9 13* 11*  ALKPHOS 109 92 86  BILITOT <0.2 0.7 0.4  PROT 7.4 8.2* 7.5  ALBUMIN 3.9 3.5 3.2*   No results for input(s): LIPASE, AMYLASE in the last 168 hours. No results for input(s): AMMONIA in the last 168 hours. Coagulation Profile: Recent Labs  Lab 11/14/17 1927  INR 1.25   Cardiac Enzymes: No results for input(s): CKTOTAL, CKMB, CKMBINDEX, TROPONINI in the last 168 hours. BNP (last 3 results) No results for input(s): PROBNP in the last 8760 hours. HbA1C: No results for input(s): HGBA1C in the last 72 hours. CBG: No results for input(s): GLUCAP in the last 168 hours. Lipid Profile: No results for input(s): CHOL, HDL, LDLCALC, TRIG, CHOLHDL, LDLDIRECT in the last 72 hours. Thyroid Function Tests: No results for input(s): TSH, T4TOTAL, FREET4, T3FREE, THYROIDAB in the last 72 hours. Anemia Panel: No results for input(s): VITAMINB12, FOLATE, FERRITIN, TIBC, IRON, RETICCTPCT in the last 72 hours. Urine analysis:    Component Value Date/Time   LABSPEC 1.015 11/13/2017 1209   BILIRUBINUR negative 11/13/2017 1209   BILIRUBINUR neg 09/07/2015 1445   KETONESUR negative 11/13/2017 1209   PROTEINUR negative 11/13/2017 1209   PROTEINUR 1+ 0.3 09/07/2015 1445   UROBILINOGEN 4.0 09/07/2015 1445   NITRITE Negative 11/13/2017 1209   NITRITE neg 09/07/2015 1445   LEUKOCYTESUR Negative 11/13/2017 1209   Sepsis Labs: @LABRCNTIP (procalcitonin:4,lacticidven:4)  ) Recent Results (from the past 240 hour(s))  MRSA PCR Screening     Status: None   Collection Time: 11/15/17  9:15 PM  Result Value Ref Range Status   MRSA by PCR NEGATIVE NEGATIVE Final    Comment:        The GeneXpert MRSA Assay (FDA approved for NASAL specimens only),  is one component of a comprehensive MRSA colonization surveillance program. It is not intended to diagnose MRSA infection nor to guide or monitor treatment for MRSA infections. Performed at [facility], [address], [place], [place] [address]          Radiology Studies: Ct Abdomen Pelvis Wo Contrast  Result Date: 11/14/2017 CLINICAL DATA:  Intermittent generalized abdominal pain with nausea, vomiting and diarrhea for 6 weeks. Acute anemia, renal failure and guaiac-positive stool EXAM: CT ABDOMEN AND PELVIS WITHOUT CONTRAST TECHNIQUE: Multidetector CT imaging of the abdomen and pelvis was performed following the standard protocol without IV contrast. COMPARISON:  None. FINDINGS: Lower chest: No acute abnormality. Hepatobiliary: No focal liver abnormality is seen. No gallstones, gallbladder wall thickening, or biliary dilatation. Pancreas: Unremarkable. No pancreatic ductal dilatation or surrounding inflammatory changes. Spleen: Normal in size without focal abnormality. Adrenals/Urinary Tract: Adrenal glands are unremarkable. Bilateral hydronephrosis, moderate to severe in degree. Irregular thickening of the posterior walls of the bladder, the likely source of the bilateral hydroureter and hydronephrosis. Stomach/Bowel: Bowel is normal in caliber.  No bowel wall thickening or evidence of bowel wall inflammation seen. Appendix is normal. Vascular/Lymphatic: Conglomerate lymphadenopathy throughout the retroperitoneum and along the bilateral iliac chain regions. No significant vascular abnormality. Reproductive: Prostate is unremarkable. Other: Periureteral fluid stranding. No circumscribed fluid collection or abscess like collection. No free intraperitoneal air. Musculoskeletal: Sclerotic focus at the anterior aspects of the L3 vertebral body. Osseous structures otherwise unremarkable. IMPRESSION: 1. Bilateral hydronephrosis, moderate to severe in degree, most likely caused by the irregular  thickening seen along the posterior bladder walls with presumed obstruction at the bilateral UVJs. This is highly suspicious for neoplastic bladder wall thickening. Recommend urology consultation for possible cystoscopy. Percutaneous nephrostomy (or nephrostomies) may also be needed. 2. Conglomerate lymphadenopathy throughout the retroperitoneum and bilateral iliac chain regions, metastatic lymphadenopathy versus lymphoma. Consider tissue sampling. 3. Single sclerotic focus at the anterior aspects of the L3 vertebral body. This is most likely a benign bone island given the single site, however, early osseous metastasis cannot be excluded. Would consider nonemergent MRI or nuclear medicine bone scan for further characterization. Electronically Signed   By: Franki Cabot M.D.   On: 11/14/2017 22:06   Ir Nephrostomy Placement Left  Result Date: 11/15/2017 INDICATION: Concern for bladder cancer, now with obstructive bilateral uropathy. Request made for placement of bilateral percutaneous nephrostomy catheters for renal preservation purposes. EXAM: 1. ULTRASOUND GUIDANCE FOR PUNCTURE OF THE BILATERAL RENAL COLLECTING SYSTEMS 2. BILATERAL PERCUTANEOUS NEPHROSTOMY TUBE PLACEMENT. COMPARISON:  CT abdomen pelvis - 11/14/2017 MEDICATIONS: Rocephin 2 g IV;; the antibiotic was administered in an appropriate time frame prior to skin puncture. ANESTHESIA/SEDATION: Moderate (conscious) sedation was employed during this procedure. A total of Versed 3 mg and Fentanyl 175 mcg was administered intravenously. Moderate Sedation Time: 37 minutes. The patient's level of consciousness and vital signs were monitored continuously by radiology nursing throughout the procedure under my direct supervision. CONTRAST:  A total of approximately 20 mL Isovue 300 was administered into both renal collecting systems FLUOROSCOPY TIME:  5 minutes, 18 seconds (478 mGy) COMPLICATIONS: None immediate. PROCEDURE: The procedure, risks, benefits, and  alternatives were explained to the patient. Questions regarding the procedure were encouraged and answered. The patient understands and consents to the procedure. A timeout was performed prior to the initiation of the procedure. The bilateral flanks were prepped and draped in usual sterile fashion with a sterile drape was applied covering the operative field. A sterile gown and sterile gloves were used for the procedure. Local anesthesia was provided with 1% Lidocaine with epinephrine. Ultrasound was used to localize the left kidney. Under direct ultrasound guidance, a 21 gauge needle was advanced into the renal collecting system. An ultrasound image documentation was performed. Access within the collecting system was confirmed with the efflux of urine followed by contrast injection. Over a Nitrex wire, the tract was dilated with an Accustick stent. Contrast injection confirmed appropriate positioning. Next, over a short Amplatz wire and under intermittent fluoroscopic guidance, the track was dilated allowing placement of a 10-French percutaneous nephrostomy catheter with end coiled and locked within the left renal pelvis. Contrast was injected and several sport radiographs were obtained in various obliquities confirming access. The catheter was secured at the skin with a Prolene retention suture and a gravity bag was placed. The identical procedure was then performed for the right kidney ultimately allowing placement of a 10 French percutaneous nephrostomy catheter via a right posterior inferior calyx with and coiled and locked within right renal pelvis. Both nephrostomy catheters were connected to gravity bags  and dressings were placed. Patient tolerated the procedure well without immediate postprocedural complication. FINDINGS: Ultrasound scanning demonstrates a moderate to severely dilated collecting systems bilaterally. Under direct ultrasound guidance, posterior inferior calices were targeted bilaterally  allowing placement of 10-French percutaneous nephrostomy catheters under intermittent fluoroscopic guidance. Bilateral percutaneous nephrostomy catheters are coiled within the bilateral renal pelvis ease. Contrast injection confirmed appropriate positioning. IMPRESSION: Successful ultrasound and fluoroscopic guided placement of bilateral 10 French percutaneous nephrostomy catheters. PLAN: - Maintain bilateral nephrostomy catheters to gravity bags. Recommend Q shift flushing with approximately 10 cc of saline until urine is no longer blood tinged. - Would allow for 2-3 weeks of track maturation prior to proceeding with definitive bilateral nephrostograms and attempted placement of antegrade ureteral stents. Electronically Signed   By: Sandi Mariscal M.D.   On: 11/15/2017 12:54   Ir Nephrostomy Placement Right  Result Date: 11/15/2017 Sandi Mariscal, MD     11/15/2017 11:51 AM Pre Procedure Dx: Hydronephrosis Post Procedural Dx: Same Successful [place] and fluoroscopic guided placement of bilateral 10 Fr PCNs with ends coiled and locked within the bilateral renal pelvis. Both PCNs connected to gravity bags. EBL: Minimal Complications: None immediate. Ronny Bacon, MD Pager #: [phone]         Scheduled Meds: . cloNIDine  0.1 mg Oral TID  . multivitamin with minerals  1 tablet Oral Daily  . pantoprazole  40 mg Oral BID  . sodium chloride flush  5 mL Intracatheter Q8H   Continuous Infusions: . cefTRIAXone (ROCEPHIN)  IV Stopped (11/15/17 1132)  .  sodium bicarbonate (isotonic) infusion in sterile water 50 mL/hr at 11/16/17 1112     LOS: 2 days    Time spent: 11min    Domenic Polite, MD [facility] Page via [email or web address], password [email or web address] After 7PM please contact night-coverage  11/16/2017, 1:07 PM

## 2017-11-16 NOTE — Progress Notes (Signed)
Bilateral lower extremity venous duplex has been completed. Negative for DVT.  11/16/17 3:49 PM Devon Cook RVT

## 2017-11-16 NOTE — Progress Notes (Signed)
[facility] Progress Note  Subjective: no new c/o, no n/v no confusion no sob  Vitals:   11/16/17 0803 11/16/17 0946 11/16/17 0947 11/16/17 1207  BP: 106/62 125/74 125/74 109/71  Pulse:      Resp: 12  15 14   Temp: (!) 97.4 F (36.3 C)   (!) 97.4 F (36.3 C)  TempSrc: Oral   Oral  SpO2: 98%     Weight:      Height:        Inpatient medications: . cloNIDine  0.1 mg Oral TID  . multivitamin with minerals  1 tablet Oral Daily  . pantoprazole  40 mg Oral BID  . sodium chloride flush  5 mL Intracatheter Q8H   . cefTRIAXone (ROCEPHIN)  IV Stopped (11/15/17 1132)  .  sodium bicarbonate (isotonic) infusion in sterile water 50 mL/hr at 11/16/17 1112   acetaminophen **OR** acetaminophen, calcium carbonate (dosed in mg elemental calcium), camphor-menthol **AND** hydrOXYzine, docusate sodium, feeding supplement (NEPRO CARB STEADY), hydrALAZINE, morphine injection, ondansetron **OR** ondansetron (ZOFRAN) IV, sorbitol, zolpidem  Exam: Gen alert and feeling a little better No jvd or bruits Chest clear bilat to bases no rales or wheezing RRR no MRG Abd soft ntnd no mass or ascites +bs R and L flank PCN tubes draining bloody urine and foley with the same GU normal male Ext trace LE edema Neuro is alert and OX3 , no asterixis  UA 5/13 > negative   home meds:  lisinopril-HCTZ 20-25 qd  cipro 500 bid  MVI/ Harvoni  Impression: 1 Renal failure - bilat hydro w/ abnormal appearing bladder/ lymphadenopathy in abdomen per CT. Renal failure presumably acute. SP bilat PCN on 5/15, making good amts or urine, creat not down up a bit today not uremic no indication for dialysis yet will watch closely and will ^IVF's to 100/ hr.  2 Anemia - due to renal failure in part poss malignancy also guiac + 3 Abnormal bladder by CT/ abdominal LAN - eval per primary 4  Vol - lungs are clear 5  Hx hep C 6  HTN - bp's dropped on low dose clonidine will dc for now  Plan - as above   Devon Splinter MD [facility] pager [phone]   11/16/2017, 1:41 PM   Recent Labs  Lab 11/14/17 1927 11/15/17 0022 11/16/17 0356  NA 137 137 136  K 4.3 4.1 5.0  CL 105 105 103  CO2 15* 13* 17*  GLUCOSE 93 87 89  BUN 155* 158* 159*  CREATININE 17.40* 18.63* 19.10*  CALCIUM 9.3 8.9  8.9 9.0  PHOS  --  9.2*  --    Recent Labs  Lab 11/13/17 0959 11/14/17 1927 11/15/17 0022  AST 14 11* 11*  ALT 9 13* 11*  ALKPHOS 109 92 86  BILITOT <0.2 0.7 0.4  PROT 7.4 8.2* 7.5  ALBUMIN 3.9 3.5 3.2*   Recent Labs  Lab 11/13/17 0959  11/14/17 1927 11/15/17 0022 11/15/17 1507 11/16/17 0356  WBC 12.3*  --  11.2* 11.5*  --  14.2*  NEUTROABS 8.3*  --  8.0* 8.1*  --   --   HGB 6.8*   < > 6.8* 6.1* 8.5* 7.5*  HCT 20.2*   < > 19.5* 17.4* 24.1* 21.7*  MCV 73*  --  69.1* 69.0*  --  73.1*  PLT 253  --  264 243  --  239   < > = values in this interval not displayed.   Iron/TIBC/Ferritin/ %Sat  Component Value Date/Time   IRON 191 (H) 10/20/2015 1354

## 2017-11-16 NOTE — Progress Notes (Signed)
Chief Complaint: Patient was seen today for follow up of (B)PCNs   Supervising Physician: Sandi Mariscal  Patient Status: [facility] ([facility] - In-pt  Subjective: S.p (B)PCN placement for hydro and renal failure Pt ok, some soreness at sites as expected.  Objective: Physical Exam: BP 125/74   Pulse 71   Temp (!) 97.4 F (36.3 C) (Oral)   Resp 15   Ht 6' (1.829 m)   Wt (!) 300 lb 11.3 oz (136.4 kg)   SpO2 98%   BMI 40.78 kg/m  (B)PCNs intact, sites clean. Both tubes with bloody but thin UOP   Current Facility-Administered Medications:  .  acetaminophen (TYLENOL) tablet 650 mg, 650 mg, Oral, Q6H PRN **OR** acetaminophen (TYLENOL) suppository 650 mg, 650 mg, Rectal, Q6H PRN, Jonelle Sidle, Mohammad L, MD .  calcium carbonate (dosed in mg elemental calcium) suspension 500 mg of elemental calcium, 500 mg of elemental calcium, Oral, Q6H PRN, Jonelle Sidle, Mohammad L, MD .  camphor-menthol (SARNA) lotion 1 application, 1 application, Topical, Z7Q PRN **AND** hydrOXYzine (ATARAX/VISTARIL) tablet 25 mg, 25 mg, Oral, Q8H PRN, Jonelle Sidle, Mohammad L, MD .  cefTRIAXone (ROCEPHIN) 2 g in sodium chloride 0.9 % 100 mL IVPB, 2 g, Intravenous, to XRAY, Allred, Darrell K, PA-C, Stopped at 11/15/17 1132 .  cloNIDine (CATAPRES) tablet 0.1 mg, 0.1 mg, Oral, TID, Roney Jaffe, MD, 0.1 mg at 11/16/17 0946 .  docusate sodium (ENEMEEZ) enema 283 mg, 1 enema, Rectal, PRN, Jonelle Sidle, Mohammad L, MD .  feeding supplement (NEPRO CARB STEADY) liquid 237 mL, 237 mL, Oral, TID PRN, Jonelle Sidle, Mohammad L, MD .  hydrALAZINE (APRESOLINE) injection 10 mg, 10 mg, Intravenous, Q6H PRN, Domenic Polite, MD .  morphine 2 MG/ML injection 1 mg, 1 mg, Intravenous, Q3H PRN, Domenic Polite, MD, 1 mg at 11/16/17 0859 .  multivitamin with minerals tablet 1 tablet, 1 tablet, Oral, Daily, Elwyn Reach, MD, 1 tablet at 11/16/17 0946 .  ondansetron (ZOFRAN) tablet 4 mg, 4 mg, Oral, Q6H PRN **OR** ondansetron (ZOFRAN) injection 4 mg, 4 mg, Intravenous, Q6H  PRN, Jonelle Sidle, Mohammad L, MD .  pantoprazole (PROTONIX) EC tablet 40 mg, 40 mg, Oral, BID, Domenic Polite, MD .  sodium bicarbonate 150 mEq in sterile water 1,000 mL infusion, , Intravenous, Continuous, Roney Jaffe, MD, Last Rate: 50 mL/hr at 11/15/17 1642 .  sodium chloride flush (NS) 0.9 % injection 5 mL, 5 mL, Intracatheter, Q8H, Sandi Mariscal, MD, 5 mL at 11/16/17 0508 .  sorbitol 70 % solution 30 mL, 30 mL, Oral, PRN, Jonelle Sidle, Mohammad L, MD .  zolpidem (AMBIEN) tablet 5 mg, 5 mg, Oral, QHS PRN, Gala Romney L, MD, 5 mg at 11/15/17 2151  Labs: CBC Recent Labs    11/15/17 0022 11/15/17 1507 11/16/17 0356  WBC 11.5*  --  14.2*  HGB 6.1* 8.5* 7.5*  HCT 17.4* 24.1* 21.7*  PLT 243  --  239   BMET Recent Labs    11/15/17 0022 11/16/17 0356  NA 137 136  K 4.1 5.0  CL 105 103  CO2 13* 17*  GLUCOSE 87 89  BUN 158* 159*  CREATININE 18.63* 19.10*  CALCIUM 8.9  8.9 9.0   LFT Recent Labs    11/15/17 0022  PROT 7.5  ALBUMIN 3.2*  AST 11*  ALT 11*  ALKPHOS 86  BILITOT 0.4   PT/INR Recent Labs    11/14/17 1927  LABPROT 15.6*  INR 1.25     Studies/Results: Ct Abdomen Pelvis Wo Contrast  Result Date: 11/14/2017 CLINICAL DATA:  Intermittent generalized abdominal pain with nausea, vomiting and diarrhea for 6 weeks. Acute anemia, renal failure and guaiac-positive stool EXAM: CT ABDOMEN AND PELVIS WITHOUT CONTRAST TECHNIQUE: Multidetector CT imaging of the abdomen and pelvis was performed following the standard protocol without IV contrast. COMPARISON:  None. FINDINGS: Lower chest: No acute abnormality. Hepatobiliary: No focal liver abnormality is seen. No gallstones, gallbladder wall thickening, or biliary dilatation. Pancreas: Unremarkable. No pancreatic ductal dilatation or surrounding inflammatory changes. Spleen: Normal in size without focal abnormality. Adrenals/Urinary Tract: Adrenal glands are unremarkable. Bilateral hydronephrosis, moderate to severe in degree.  Irregular thickening of the posterior walls of the bladder, the likely source of the bilateral hydroureter and hydronephrosis. Stomach/Bowel: Bowel is normal in caliber. No bowel wall thickening or evidence of bowel wall inflammation seen. Appendix is normal. Vascular/Lymphatic: Conglomerate lymphadenopathy throughout the retroperitoneum and along the bilateral iliac chain regions. No significant vascular abnormality. Reproductive: Prostate is unremarkable. Other: Periureteral fluid stranding. No circumscribed fluid collection or abscess like collection. No free intraperitoneal air. Musculoskeletal: Sclerotic focus at the anterior aspects of the L3 vertebral body. Osseous structures otherwise unremarkable. IMPRESSION: 1. Bilateral hydronephrosis, moderate to severe in degree, most likely caused by the irregular thickening seen along the posterior bladder walls with presumed obstruction at the bilateral UVJs. This is highly suspicious for neoplastic bladder wall thickening. Recommend urology consultation for possible cystoscopy. Percutaneous nephrostomy (or nephrostomies) may also be needed. 2. Conglomerate lymphadenopathy throughout the retroperitoneum and bilateral iliac chain regions, metastatic lymphadenopathy versus lymphoma. Consider tissue sampling. 3. Single sclerotic focus at the anterior aspects of the L3 vertebral body. This is most likely a benign bone island given the single site, however, early osseous metastasis cannot be excluded. Would consider nonemergent MRI or nuclear medicine bone scan for further characterization. Electronically Signed   By: Franki Cabot M.D.   On: 11/14/2017 22:06   Ir Nephrostomy Placement Left  Result Date: 11/15/2017 INDICATION: Concern for bladder cancer, now with obstructive bilateral uropathy. Request made for placement of bilateral percutaneous nephrostomy catheters for renal preservation purposes. EXAM: 1. ULTRASOUND GUIDANCE FOR PUNCTURE OF THE BILATERAL RENAL  COLLECTING SYSTEMS 2. BILATERAL PERCUTANEOUS NEPHROSTOMY TUBE PLACEMENT. COMPARISON:  CT abdomen pelvis - 11/14/2017 MEDICATIONS: Rocephin 2 g IV;; the antibiotic was administered in an appropriate time frame prior to skin puncture. ANESTHESIA/SEDATION: Moderate (conscious) sedation was employed during this procedure. A total of Versed 3 mg and Fentanyl 175 mcg was administered intravenously. Moderate Sedation Time: 37 minutes. The patient's level of consciousness and vital signs were monitored continuously by radiology nursing throughout the procedure under my direct supervision. CONTRAST:  A total of approximately 20 mL Isovue 300 was administered into both renal collecting systems FLUOROSCOPY TIME:  5 minutes, 18 seconds (478 mGy) COMPLICATIONS: None immediate. PROCEDURE: The procedure, risks, benefits, and alternatives were explained to the patient. Questions regarding the procedure were encouraged and answered. The patient understands and consents to the procedure. A timeout was performed prior to the initiation of the procedure. The bilateral flanks were prepped and draped in usual sterile fashion with a sterile drape was applied covering the operative field. A sterile gown and sterile gloves were used for the procedure. Local anesthesia was provided with 1% Lidocaine with epinephrine. Ultrasound was used to localize the left kidney. Under direct ultrasound guidance, a 21 gauge needle was advanced into the renal collecting system. An ultrasound image documentation was performed. Access within the collecting system was confirmed with the efflux of urine followed by contrast injection. Over a Nitrex  wire, the tract was dilated with an Accustick stent. Contrast injection confirmed appropriate positioning. Next, over a short Amplatz wire and under intermittent fluoroscopic guidance, the track was dilated allowing placement of a 10-French percutaneous nephrostomy catheter with end coiled and locked within the left  renal pelvis. Contrast was injected and several sport radiographs were obtained in various obliquities confirming access. The catheter was secured at the skin with a Prolene retention suture and a gravity bag was placed. The identical procedure was then performed for the right kidney ultimately allowing placement of a 10 French percutaneous nephrostomy catheter via a right posterior inferior calyx with and coiled and locked within right renal pelvis. Both nephrostomy catheters were connected to gravity bags and dressings were placed. Patient tolerated the procedure well without immediate postprocedural complication. FINDINGS: Ultrasound scanning demonstrates a moderate to severely dilated collecting systems bilaterally. Under direct ultrasound guidance, posterior inferior calices were targeted bilaterally allowing placement of 10-French percutaneous nephrostomy catheters under intermittent fluoroscopic guidance. Bilateral percutaneous nephrostomy catheters are coiled within the bilateral renal pelvis ease. Contrast injection confirmed appropriate positioning. IMPRESSION: Successful ultrasound and fluoroscopic guided placement of bilateral 10 French percutaneous nephrostomy catheters. PLAN: - Maintain bilateral nephrostomy catheters to gravity bags. Recommend Q shift flushing with approximately 10 cc of saline until urine is no longer blood tinged. - Would allow for 2-3 weeks of track maturation prior to proceeding with definitive bilateral nephrostograms and attempted placement of antegrade ureteral stents. Electronically Signed   By: Sandi Mariscal M.D.   On: 11/15/2017 12:54   Ir Nephrostomy Placement Right  Result Date: 11/15/2017 Sandi Mariscal, MD     11/15/2017 11:51 AM Pre Procedure Dx: Hydronephrosis Post Procedural Dx: Same Successful [place] and fluoroscopic guided placement of bilateral 10 Fr PCNs with ends coiled and locked within the bilateral renal pelvis. Both PCNs connected to gravity bags. EBL: Minimal  Complications: None immediate. Ronny Bacon, MD Pager #: [phone]     Assessment/Plan: Obstructive uropathy with (B)hydro S/p (B)PCN placement 5/15 Output bloody, continue qshift flushing. Anticipate UOP to clear over the next few days. Cr up to 19, hopefully will start trending back down IR following    LOS: 2 days   I spent a total of 20 minutes in face to face in clinical consultation, greater than 50% of which was counseling/coordinating care for (B)PCN  Ascencion Dike PA-C 11/16/2017 10:48 AM

## 2017-11-17 LAB — PSA: Prostatic Specific Antigen: 787 ng/mL — ABNORMAL HIGH (ref 0.00–4.00)

## 2017-11-17 LAB — BASIC METABOLIC PANEL
ANION GAP: 17 — AB (ref 5–15)
BUN: 160 mg/dL — AB (ref 6–20)
CHLORIDE: 98 mmol/L — AB (ref 101–111)
CO2: 20 mmol/L — ABNORMAL LOW (ref 22–32)
Calcium: 8.3 mg/dL — ABNORMAL LOW (ref 8.9–10.3)
Creatinine, Ser: 18.43 mg/dL — ABNORMAL HIGH (ref 0.61–1.24)
GFR calc Af Amer: 3 mL/min — ABNORMAL LOW (ref >60)
GFR calc non Af Amer: 2 mL/min — ABNORMAL LOW (ref >60)
GLUCOSE: 101 mg/dL — AB (ref 65–99)
Potassium: 4.2 mmol/L (ref 3.5–5.1)
SODIUM: 135 mmol/L (ref 135–145)

## 2017-11-17 LAB — CBC
HEMATOCRIT: 19.4 % — AB (ref 39.0–52.0)
Hemoglobin: 6.8 g/dL — CL (ref 13.0–17.0)
MCH: 25.1 pg — AB (ref 26.0–34.0)
MCHC: 35.1 g/dL (ref 30.0–36.0)
MCV: 71.6 fL — ABNORMAL LOW (ref 78.0–100.0)
Platelets: 236 10*3/uL (ref 150–400)
RBC: 2.71 MIL/uL — ABNORMAL LOW (ref 4.22–5.81)
RDW: 15.6 % — AB (ref 11.5–15.5)
WBC: 13.3 10*3/uL — ABNORMAL HIGH (ref 4.0–10.5)

## 2017-11-17 LAB — HEMOGLOBIN A1C
Hgb A1c MFr Bld: 5.1 % (ref 4.8–5.6)
MEAN PLASMA GLUCOSE: 100 mg/dL

## 2017-11-17 LAB — PREPARE RBC (CROSSMATCH)

## 2017-11-17 MED ORDER — SODIUM CHLORIDE 0.9 % IV SOLN
Freq: Once | INTRAVENOUS | Status: AC
  Administered 2017-11-17: 08:00:00 via INTRAVENOUS

## 2017-11-17 MED ORDER — BISACODYL 10 MG RE SUPP
10.0000 mg | Freq: Once | RECTAL | Status: AC
  Administered 2017-11-17: 10 mg via RECTAL
  Filled 2017-11-17: qty 1

## 2017-11-17 NOTE — Progress Notes (Signed)
Verbal order from Dr. Broadus John for one time dose of dulcolax 10 mg suppository

## 2017-11-17 NOTE — Progress Notes (Signed)
Subjective/Chief Complaint:  1 - Bilateral Hydronephrosis / Acute Renal Failure - Cr 17 by PCP labs on eval malaize 11/2017. Non-con CT with hydro to level of thickened bladder. Bilateral neph tubes placed 11/2017.  2 - Bladder Thickening / Retroperitoneal Lymphadenopathy / Rule Out Primary Bladder or Other GU Cancer - sigificant bladder trigone thickening by CT 11/2017. Denies h/o gross hematuria but microhematuria noted x several. No cysto yet.  3 - Prostate Cancer Screening - Pt's grandfather with prostate cancer. PSA 11/2017 - pending.  PMH sig for f HTN and hep C s/p harvoni txt. 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   Today "Devon Cook" is stable. Much less malaise. UOP excellent.     Objective: Vital signs in last 24 hours: Temp:  [98.3 F (36.8 C)-99.1 F (37.3 C)] 98.4 F (36.9 C) (05/17 1156) Pulse Rate:  [75-77] 77 (05/17 1335) Resp:  [13-14] 14 (05/17 1156) BP: (105-122)/(63-68) 105/65 (05/17 1335) SpO2:  [93 %-100 %] 100 % (05/17 1156) Last BM Date: 11/15/17  Intake/Output from previous day: 05/16 0701 - 05/17 0700 In: 2729.2 [P.O.:840; I.V.:1889.2] Out: 9379 [Urine:4175] Intake/Output this shift: Total I/O In: 1053.3 [P.O.:300; I.V.:433.3; Blood:310; Other:10] Out: 2325 [Urine:2325]  General appearance: alert, cooperative, appears stated age and very pleasant.  Eyes: negative Nose: Nares normal. Septum midline. Mucosa normal. No drainage or sinus tenderness. Throat: lips, mucosa, and tongue normal; teeth and gums normal Neck: supple, symmetrical, trachea midline Back: symmetric, no curvature. ROM normal. No CVA tenderness., neph tubes in place with copious urine that is thing and some blodo tinged.  Resp: non-labored on room air.  Cardio: Nl rate Male genitalia: normal Extremities: extremities normal, atraumatic, no cyanosis or edema Pulses: 2+ and symmetric Skin: Skin color, texture, turgor normal. No  rashes or lesions Lymph nodes: Cervical, supraclavicular, and axillary nodes normal. Neurologic: Grossly normal  Lab Results:  Recent Labs    11/16/17 0356 11/17/17 0455  WBC 14.2* 13.3*  HGB 7.5* 6.8*  HCT 21.7* 19.4*  PLT 239 236   BMET Recent Labs    11/16/17 0356 11/17/17 0455  NA 136 135  K 5.0 4.2  CL 103 98*  CO2 17* 20*  GLUCOSE 89 101*  BUN 159* 160*  CREATININE 19.10* 18.43*  CALCIUM 9.0 8.3*   PT/INR Recent Labs    11/14/17 1927  LABPROT 15.6*  INR 1.25   ABG No results for input(s): PHART, HCO3 in the last 72 hours.  Invalid input(s): PCO2, PO2  Studies/Results: No results found.  Anti-infectives: Anti-infectives (From admission, onward)   Start     Dose/Rate Route Frequency Ordered Stop   11/16/17 0800  cefTRIAXone (ROCEPHIN) 2 g in sodium chloride 0.9 % 100 mL IVPB     2 g 200 mL/hr over 30 Minutes Intravenous To Radiology 11/15/17 1022 11/17/17 0800      Assessment/Plan:  1 - Bilateral Hydronephrosis / Acute Renal Failure - agree with neph tubes for now for likely severe GU level obstruction. Volume status good. He likely will have permanent severe GFR compromise from this insult.   2 - Bladder Thickening / Retroperitoneal Lymphadenopathy / Rule Out Primary Bladder or Other GU Cancer - discussed with pt plan for OR cysto / retrogrades / bladder (also possible prostate) biopsy in elective setting once over acute metabolic deragnement. I do NOT plan this during current hospitalization.   3 - Prostate Cancer Screening - PSA today, very low threshold for prostate  biopsy pending this.   We will see pt PRN over the weekend. Please call me directly with questions anytime.   [facility], Perez Dirico 11/17/2017

## 2017-11-17 NOTE — Progress Notes (Signed)
[facility] Progress Note  Subjective: no new c/o, no n/v no confusion no sob creat down to 18 today  Vitals:   11/17/17 0804 11/17/17 0826 11/17/17 1156 11/17/17 1335  BP: 112/63 108/68 122/67 105/65  Pulse:   75 77  Resp: 13  14   Temp: 98.4 F (36.9 C) 98.3 F (36.8 C) 98.4 F (36.9 C)   TempSrc: Oral Oral Oral   SpO2: 93%  100%   Weight:      Height:        Inpatient medications: . bisacodyl  10 mg Rectal Once  . multivitamin with minerals  1 tablet Oral Daily  . pantoprazole  40 mg Oral BID  . sodium chloride flush  5 mL Intracatheter Q8H   .  sodium bicarbonate (isotonic) infusion in sterile water Stopped (11/17/17 0811)   acetaminophen **OR** acetaminophen, calcium carbonate (dosed in mg elemental calcium), camphor-menthol **AND** hydrOXYzine, docusate sodium, feeding supplement (NEPRO CARB STEADY), hydrALAZINE, morphine injection, ondansetron **OR** ondansetron (ZOFRAN) IV, sorbitol, zolpidem  Exam: Gen alert and feeling a little better No jvd or bruits Chest clear bilat to bases no rales or wheezing RRR no MRG Abd soft ntnd no mass or ascites +bs R and L flank PCN tubes draining bloody urine and foley with the same GU normal male Ext trace LE edema Neuro is alert and OX3 , no asterixis  UA 5/13 > negative   home meds:  lisinopril-HCTZ 20-25 qd  cipro 500 bid  MVI/ Harvoni  Impression: 1 Renal failure - bilat hydronephrosis per CT on admit.  SP bilat PCN on 5/15. Looks to be starting to recover renal function w/ slight decline in creatinine today from 19 > 18. No uremic signs or symptoms. Cont supportive care  2 Anemia - due to renal failure in part also guiac + 3 Abnormal bladder by CT/ abdominal LAN - seen by urology supsect bladder ca will need cysto when more stable 4  Vol - lungs are clear 5  Hx hep C  Plan - as above   Kelly Splinter MD [place] pager [phone]   11/17/2017, 1:40 PM   Recent Labs  Lab  11/15/17 0022 11/16/17 0356 11/17/17 0455  NA 137 136 135  K 4.1 5.0 4.2  CL 105 103 98*  CO2 13* 17* 20*  GLUCOSE 87 89 101*  BUN 158* 159* 160*  CREATININE 18.63* 19.10* 18.43*  CALCIUM 8.9  8.9 9.0 8.3*  PHOS 9.2*  --   --    Recent Labs  Lab 11/13/17 0959 11/14/17 1927 11/15/17 0022  AST 14 11* 11*  ALT 9 13* 11*  ALKPHOS 109 92 86  BILITOT <0.2 0.7 0.4  PROT 7.4 8.2* 7.5  ALBUMIN 3.9 3.5 3.2*   Recent Labs  Lab 11/13/17 0959  11/14/17 1927 11/15/17 0022 11/15/17 1507 11/16/17 0356 11/17/17 0455  WBC 12.3*   < > 11.2* 11.5*  --  14.2* 13.3*  NEUTROABS 8.3*  --  8.0* 8.1*  --   --   --   HGB 6.8*   < > 6.8* 6.1* 8.5* 7.5* 6.8*  HCT 20.2*   < > 19.5* 17.4* 24.1* 21.7* 19.4*  MCV 73*   < > 69.1* 69.0*  --  73.1* 71.6*  PLT 253   < > 264 243  --  239 236   < > = values in this interval not displayed.   Iron/TIBC/Ferritin/ %Sat    Component Value Date/Time   IRON 191 (  H) 10/20/2015 1354

## 2017-11-17 NOTE — Progress Notes (Signed)
PROGRESS NOTE    Devon Cook  MLJ:[id number] DOB: 21-Jun-1961 DOA: 11/14/2017 PCP: [name], MD  Brief Narrative:57 year old male with history of hypertension and hep C admitted with malaise and difficulty voiding for 1-2 months, saw  PCP was treated with couple of rounds of antibiotics, had labs drawn yesterday which revealed a creatinine of 17 and was referred to the emergency room -CT revealed bilateral hydronephrosis at the level of the bladder with diffuse bladder wall thickening, bulky retroperitoneal lymphadenopathy -urology consulted, IR consulted, status post bilateral percutaneous nephrostomy tubes 5/15 a.m.   Assessment & Plan:   AKI (acute renal failure) (HCC) -postobstructive primarily, bilateral hydronephrosis, bladder CA suspected based on imaging -Was also on lisinopril and taking NSAIDs prior to admission -Baseline creatinine was normal in 2017 -Urology following, interventional radiology consulted -status post bilateral nephrostomy tubes 5/15 a.m, urine output of 3 L 5/15 and 2.7L on 5/16, unfortunately still no reportable change in creatinine yet -Monitor urine output and labs daily -renal and urology following -no acute indication for dialysis yet  Bilateral severe hydronephrosis, bladder wall thickening and bulky retroperitoneal lymphadenopathy -All this is concerning for  Metastatic bladder CA -Urology following, will need cystoscopy and biopsy  Anemia -Acute, last labs show hemoglobin of 15 from 2017 -Suspect this is primarily secondary to possible underlying bladder CA -However patient also gives history of melena/black stools for few days prior to admission, was Hemoccult positive in the emergency room, GI consulted,  plan to defer GI workup at this time due to multitude of other active medical problems, no further melena -hemoglobin dropped to 6.8 today I suspect partly dilutional from IV fluids and has low-grade hematuria from nephrostomy tubes,  transfuse 1 unit PRBC  History of hepatitis C -status post Harvoni Rx  DVT prophylaxis: SCDs Code Status: Full code Family Communication: wife at bedside Disposition Plan: pending improvement in kidney function  Consultants:   IR  Renal  Urology   Procedures: Bilateral PCNs 5/15  Antimicrobials:    Subjective: -feels little better today, wants to walk around -Appetite is okay, no dyspnea  Objective: Vitals:   11/17/17 0012 11/17/17 0441 11/17/17 0804 11/17/17 0826  BP:   112/63 108/68  Pulse:      Resp:   13   Temp: 98.6 F (37 C) 98.6 F (37 C) 98.4 F (36.9 C) 98.3 F (36.8 C)  TempSrc: Oral Oral Oral Oral  SpO2:   93%   Weight:      Height:        Intake/Output Summary (Last 24 hours) at 11/17/2017 1103 Last data filed at 11/17/2017 1219 Gross per 24 hour  Intake 2669.17 ml  Output 4975 ml  Net -2305.83 ml   Filed Weights   11/15/17 0605  Weight: (!) 136.4 kg (300 lb 11.3 oz)    Examination:  Gen: well built African-American male, laying in bed, no distress HEENT: PERRLA, Neck supple, + JVD Lungs: decreased breath sounds both bases, rest is clear CVS: RRR,No Gallops,Rubs or new Murmurs Abd: soft, Non tender, non distended, BS present, bilateral percutaneous nephrostomy tubes Extremities: 1+ edema Skin: no new rashes Psychiatry: Judgement and insight appear normal. Mood & affect appropriate.     Data Reviewed:   CBC: Recent Labs  Lab 11/13/17 0959 11/14/17 1927 11/15/17 0022 11/15/17 1507 11/16/17 0356 11/17/17 0455  WBC 12.3* 11.2* 11.5*  --  14.2* 13.3*  NEUTROABS 8.3* 8.0* 8.1*  --   --   --   HGB 6.8* 6.8*  6.1* 8.5* 7.5* 6.8*  HCT 20.2* 19.5* 17.4* 24.1* 21.7* 19.4*  MCV 73* 69.1* 69.0*  --  73.1* 71.6*  PLT 253 264 243  --  239 852   Basic Metabolic Panel: Recent Labs  Lab 11/13/17 0959 11/14/17 1927 11/15/17 0022 11/16/17 0356 11/17/17 0455  NA 140 137 137 136 135  K 5.0 4.3 4.1 5.0 4.2  CL 104 105 105 103 98*    CO2 12* 15* 13* 17* 20*  GLUCOSE 72 93 87 89 101*  BUN 125* 155* 158* 159* 160*  CREATININE 16.83* 17.40* 18.63* 19.10* 18.43*  CALCIUM 8.9 9.3 8.9  8.9 9.0 8.3*  PHOS  --   --  9.2*  --   --    GFR: Estimated Creatinine Clearance: 6.4 mL/min (A) (by C-G formula based on SCr of 18.43 mg/dL (H)). Liver Function Tests: Recent Labs  Lab 11/13/17 0959 11/14/17 1927 11/15/17 0022  AST 14 11* 11*  ALT 9 13* 11*  ALKPHOS 109 92 86  BILITOT <0.2 0.7 0.4  PROT 7.4 8.2* 7.5  ALBUMIN 3.9 3.5 3.2*   No results for input(s): LIPASE, AMYLASE in the last 168 hours. No results for input(s): AMMONIA in the last 168 hours. Coagulation Profile: Recent Labs  Lab 11/14/17 1927  INR 1.25   Cardiac Enzymes: No results for input(s): CKTOTAL, CKMB, CKMBINDEX, TROPONINI in the last 168 hours. BNP (last 3 results) No results for input(s): PROBNP in the last 8760 hours. HbA1C: Recent Labs    11/16/17 0356  HGBA1C 5.1   CBG: No results for input(s): GLUCAP in the last 168 hours. Lipid Profile: No results for input(s): CHOL, HDL, LDLCALC, TRIG, CHOLHDL, LDLDIRECT in the last 72 hours. Thyroid Function Tests: No results for input(s): TSH, T4TOTAL, FREET4, T3FREE, THYROIDAB in the last 72 hours. Anemia Panel: No results for input(s): VITAMINB12, FOLATE, FERRITIN, TIBC, IRON, RETICCTPCT in the last 72 hours. Urine analysis:    Component Value Date/Time   LABSPEC 1.015 11/13/2017 1209   BILIRUBINUR negative 11/13/2017 1209   BILIRUBINUR neg 09/07/2015 1445   KETONESUR negative 11/13/2017 1209   PROTEINUR negative 11/13/2017 1209   PROTEINUR 1+ 0.3 09/07/2015 1445   UROBILINOGEN 4.0 09/07/2015 1445   NITRITE Negative 11/13/2017 1209   NITRITE neg 09/07/2015 1445   LEUKOCYTESUR Negative 11/13/2017 1209   Sepsis Labs: @LABRCNTIP (procalcitonin:4,lacticidven:4)  ) Recent Results (from the past 240 hour(s))  MRSA PCR Screening     Status: None   Collection Time: 11/15/17  9:15 PM   Result Value Ref Range Status   MRSA by PCR NEGATIVE NEGATIVE Final    Comment:        The GeneXpert MRSA Assay (FDA approved for NASAL specimens only), is one component of a comprehensive MRSA colonization surveillance program. It is not intended to diagnose MRSA infection nor to guide or monitor treatment for MRSA infections. Performed at [facility], [place] [address], [place], [place] [address]          Radiology Studies: Ir Nephrostomy Placement Left  Result Date: 11/15/2017 INDICATION: Concern for bladder cancer, now with obstructive bilateral uropathy. Request made for placement of bilateral percutaneous nephrostomy catheters for renal preservation purposes. EXAM: 1. ULTRASOUND GUIDANCE FOR PUNCTURE OF THE BILATERAL RENAL COLLECTING SYSTEMS 2. BILATERAL PERCUTANEOUS NEPHROSTOMY TUBE PLACEMENT. COMPARISON:  CT abdomen pelvis - 11/14/2017 MEDICATIONS: Rocephin 2 g IV;; the antibiotic was administered in an appropriate time frame prior to skin puncture. ANESTHESIA/SEDATION: Moderate (conscious) sedation was employed during this procedure. A  total of Versed 3 mg and Fentanyl 175 mcg was administered intravenously. Moderate Sedation Time: 37 minutes. The patient's level of consciousness and vital signs were monitored continuously by radiology nursing throughout the procedure under my direct supervision. CONTRAST:  A total of approximately 20 mL Isovue 300 was administered into both renal collecting systems FLUOROSCOPY TIME:  5 minutes, 18 seconds (130 mGy) COMPLICATIONS: None immediate. PROCEDURE: The procedure, risks, benefits, and alternatives were explained to the patient. Questions regarding the procedure were encouraged and answered. The patient understands and consents to the procedure. A timeout was performed prior to the initiation of the procedure. The bilateral flanks were prepped and draped in usual sterile fashion with a sterile drape was applied covering the operative  field. A sterile gown and sterile gloves were used for the procedure. Local anesthesia was provided with 1% Lidocaine with epinephrine. Ultrasound was used to localize the left kidney. Under direct ultrasound guidance, a 21 gauge needle was advanced into the renal collecting system. An ultrasound image documentation was performed. Access within the collecting system was confirmed with the efflux of urine followed by contrast injection. Over a Nitrex wire, the tract was dilated with an Accustick stent. Contrast injection confirmed appropriate positioning. Next, over a short Amplatz wire and under intermittent fluoroscopic guidance, the track was dilated allowing placement of a 10-French percutaneous nephrostomy catheter with end coiled and locked within the left renal pelvis. Contrast was injected and several sport radiographs were obtained in various obliquities confirming access. The catheter was secured at the skin with a Prolene retention suture and a gravity bag was placed. The identical procedure was then performed for the right kidney ultimately allowing placement of a 10 French percutaneous nephrostomy catheter via a right posterior inferior calyx with and coiled and locked within right renal pelvis. Both nephrostomy catheters were connected to gravity bags and dressings were placed. Patient tolerated the procedure well without immediate postprocedural complication. FINDINGS: Ultrasound scanning demonstrates a moderate to severely dilated collecting systems bilaterally. Under direct ultrasound guidance, posterior inferior calices were targeted bilaterally allowing placement of 10-French percutaneous nephrostomy catheters under intermittent fluoroscopic guidance. Bilateral percutaneous nephrostomy catheters are coiled within the bilateral renal pelvis ease. Contrast injection confirmed appropriate positioning. IMPRESSION: Successful ultrasound and fluoroscopic guided placement of bilateral 10 French  percutaneous nephrostomy catheters. PLAN: - Maintain bilateral nephrostomy catheters to gravity bags. Recommend Q shift flushing with approximately 10 cc of saline until urine is no longer blood tinged. - Would allow for 2-3 weeks of track maturation prior to proceeding with definitive bilateral nephrostograms and attempted placement of antegrade ureteral stents. Electronically Signed   By: Sandi Mariscal M.D.   On: 11/15/2017 12:54   Ir Nephrostomy Placement Right  Result Date: 11/15/2017 Sandi Mariscal, MD     11/15/2017 11:51 AM Pre Procedure Dx: Hydronephrosis Post Procedural Dx: Same Successful [place] and fluoroscopic guided placement of bilateral 10 Fr PCNs with ends coiled and locked within the bilateral renal pelvis. Both PCNs connected to gravity bags. EBL: Minimal Complications: None immediate. Ronny Bacon, MD Pager #: [phone]         Scheduled Meds: . multivitamin with minerals  1 tablet Oral Daily  . pantoprazole  40 mg Oral BID  . sodium chloride flush  5 mL Intracatheter Q8H   Continuous Infusions: .  sodium bicarbonate (isotonic) infusion in sterile water Stopped (11/17/17 0811)     LOS: 3 days    Time spent: 67min    Domenic Polite, MD [facility] Page  via [email or web address], password [email or web address] After 7PM please contact night-coverage  11/17/2017, 11:03 AM

## 2017-11-17 NOTE — Progress Notes (Signed)
Critical lab: Hgb 6.8. Pt remains with bloody drainage from nephrostomy tubes and F/C. No other signs of active bleeding. Pt denies distres, VSS. Lamar Blinks, NP notified.

## 2017-11-17 NOTE — Progress Notes (Signed)
Referring Physician(s): Johny Shears  Supervising Physician: Arne Cleveland  Patient Status:  [facility] - In-pt  Chief Complaint: "Drain checks"  Subjective:  S/p bilateral percutaneous nephrostomy tube placement 11/15/2017 by Dr. Pascal Lux. Patient awake and alert with no complaints. Accompanied by wife at bedside. Denies fever or flank pain/tenderness.  Allergies: Patient has no known allergies.  Medications: Prior to Admission medications   Medication Sig Start Date End Date Taking? Authorizing Provider  lisinopril-hydrochlorothiazide (PRINZIDE,ZESTORETIC) 20-25 MG tablet Take 1 tablet by mouth daily. 11/13/17  Yes [name], MD  Multiple Vitamins-Minerals (MULTIVITAMIN WITH MINERALS) tablet Take 1 tablet by mouth daily.     Yes [provider]  ciprofloxacin (CIPRO) 500 MG tablet Take 1 tablet (500 mg total) by mouth 2 (two) times daily. Patient not taking: Reported on 11/13/2017 10/11/17   [name], MD  Ledipasvir-Sofosbuvir (HARVONI) 90-400 MG TABS Take 1 tablet by mouth daily. Patient not taking: Reported on 09/25/2017 11/18/15   Thayer Headings, MD     Vital Signs: BP 122/67   Pulse 75   Temp 98.4 F (36.9 C) (Oral)   Resp 14   Ht 6' (1.829 m)   Wt (!) 300 lb 11.3 oz (136.4 kg)   SpO2 100%   BMI 40.78 kg/m   Physical Exam  Constitutional: He is oriented to person, place, and time. He appears well-developed and well-nourished. No distress.  Pulmonary/Chest: Effort normal. No respiratory distress.  Neurological: He is alert and oriented to person, place, and time.  Skin: Skin is warm and dry.  Bilateral flanks at sites of percutaneous nephrostomy c/d/i without active bleeding, erythema, or tenderness.  Psychiatric: He has a normal mood and affect. His behavior is normal. Judgment and thought content normal.  Nursing note and vitals reviewed.   Imaging: Ct Abdomen Pelvis Wo Contrast  Result Date: 11/14/2017 CLINICAL DATA:  Intermittent  generalized abdominal pain with nausea, vomiting and diarrhea for 6 weeks. Acute anemia, renal failure and guaiac-positive stool EXAM: CT ABDOMEN AND PELVIS WITHOUT CONTRAST TECHNIQUE: Multidetector CT imaging of the abdomen and pelvis was performed following the standard protocol without IV contrast. COMPARISON:  None. FINDINGS: Lower chest: No acute abnormality. Hepatobiliary: No focal liver abnormality is seen. No gallstones, gallbladder wall thickening, or biliary dilatation. Pancreas: Unremarkable. No pancreatic ductal dilatation or surrounding inflammatory changes. Spleen: Normal in size without focal abnormality. Adrenals/Urinary Tract: Adrenal glands are unremarkable. Bilateral hydronephrosis, moderate to severe in degree. Irregular thickening of the posterior walls of the bladder, the likely source of the bilateral hydroureter and hydronephrosis. Stomach/Bowel: Bowel is normal in caliber. No bowel wall thickening or evidence of bowel wall inflammation seen. Appendix is normal. Vascular/Lymphatic: Conglomerate lymphadenopathy throughout the retroperitoneum and along the bilateral iliac chain regions. No significant vascular abnormality. Reproductive: Prostate is unremarkable. Other: Periureteral fluid stranding. No circumscribed fluid collection or abscess like collection. No free intraperitoneal air. Musculoskeletal: Sclerotic focus at the anterior aspects of the L3 vertebral body. Osseous structures otherwise unremarkable. IMPRESSION: 1. Bilateral hydronephrosis, moderate to severe in degree, most likely caused by the irregular thickening seen along the posterior bladder walls with presumed obstruction at the bilateral UVJs. This is highly suspicious for neoplastic bladder wall thickening. Recommend urology consultation for possible cystoscopy. Percutaneous nephrostomy (or nephrostomies) may also be needed. 2. Conglomerate lymphadenopathy throughout the retroperitoneum and bilateral iliac chain regions,  metastatic lymphadenopathy versus lymphoma. Consider tissue sampling. 3. Single sclerotic focus at the anterior aspects of the L3 vertebral body. This is most  likely a benign bone island given the single site, however, early osseous metastasis cannot be excluded. Would consider nonemergent MRI or nuclear medicine bone scan for further characterization. Electronically Signed   By: Franki Cabot M.D.   On: 11/14/2017 22:06   Ir Nephrostomy Placement Left  Result Date: 11/15/2017 INDICATION: Concern for bladder cancer, now with obstructive bilateral uropathy. Request made for placement of bilateral percutaneous nephrostomy catheters for renal preservation purposes. EXAM: 1. ULTRASOUND GUIDANCE FOR PUNCTURE OF THE BILATERAL RENAL COLLECTING SYSTEMS 2. BILATERAL PERCUTANEOUS NEPHROSTOMY TUBE PLACEMENT. COMPARISON:  CT abdomen pelvis - 11/14/2017 MEDICATIONS: Rocephin 2 g IV;; the antibiotic was administered in an appropriate time frame prior to skin puncture. ANESTHESIA/SEDATION: Moderate (conscious) sedation was employed during this procedure. A total of Versed 3 mg and Fentanyl 175 mcg was administered intravenously. Moderate Sedation Time: 37 minutes. The patient's level of consciousness and vital signs were monitored continuously by radiology nursing throughout the procedure under my direct supervision. CONTRAST:  A total of approximately 20 mL Isovue 300 was administered into both renal collecting systems FLUOROSCOPY TIME:  5 minutes, 18 seconds (400 mGy) COMPLICATIONS: None immediate. PROCEDURE: The procedure, risks, benefits, and alternatives were explained to the patient. Questions regarding the procedure were encouraged and answered. The patient understands and consents to the procedure. A timeout was performed prior to the initiation of the procedure. The bilateral flanks were prepped and draped in usual sterile fashion with a sterile drape was applied covering the operative field. A sterile gown and  sterile gloves were used for the procedure. Local anesthesia was provided with 1% Lidocaine with epinephrine. Ultrasound was used to localize the left kidney. Under direct ultrasound guidance, a 21 gauge needle was advanced into the renal collecting system. An ultrasound image documentation was performed. Access within the collecting system was confirmed with the efflux of urine followed by contrast injection. Over a Nitrex wire, the tract was dilated with an Accustick stent. Contrast injection confirmed appropriate positioning. Next, over a short Amplatz wire and under intermittent fluoroscopic guidance, the track was dilated allowing placement of a 10-French percutaneous nephrostomy catheter with end coiled and locked within the left renal pelvis. Contrast was injected and several sport radiographs were obtained in various obliquities confirming access. The catheter was secured at the skin with a Prolene retention suture and a gravity bag was placed. The identical procedure was then performed for the right kidney ultimately allowing placement of a 10 French percutaneous nephrostomy catheter via a right posterior inferior calyx with and coiled and locked within right renal pelvis. Both nephrostomy catheters were connected to gravity bags and dressings were placed. Patient tolerated the procedure well without immediate postprocedural complication. FINDINGS: Ultrasound scanning demonstrates a moderate to severely dilated collecting systems bilaterally. Under direct ultrasound guidance, posterior inferior calices were targeted bilaterally allowing placement of 10-French percutaneous nephrostomy catheters under intermittent fluoroscopic guidance. Bilateral percutaneous nephrostomy catheters are coiled within the bilateral renal pelvis ease. Contrast injection confirmed appropriate positioning. IMPRESSION: Successful ultrasound and fluoroscopic guided placement of bilateral 10 French percutaneous nephrostomy catheters.  PLAN: - Maintain bilateral nephrostomy catheters to gravity bags. Recommend Q shift flushing with approximately 10 cc of saline until urine is no longer blood tinged. - Would allow for 2-3 weeks of track maturation prior to proceeding with definitive bilateral nephrostograms and attempted placement of antegrade ureteral stents. Electronically Signed   By: Sandi Mariscal M.D.   On: 11/15/2017 12:54   Ir Nephrostomy Placement Right  Result Date: 11/15/2017 Sandi Mariscal, MD  11/15/2017 11:51 AM Pre Procedure Dx: Hydronephrosis Post Procedural Dx: Same Successful [place] and fluoroscopic guided placement of bilateral 10 Fr PCNs with ends coiled and locked within the bilateral renal pelvis. Both PCNs connected to gravity bags. EBL: Minimal Complications: None immediate. Ronny Bacon, MD Pager #: [phone]     Labs:  CBC: Recent Labs    11/14/17 1927 11/15/17 0022 11/15/17 1507 11/16/17 0356 11/17/17 0455  WBC 11.2* 11.5*  --  14.2* 13.3*  HGB 6.8* 6.1* 8.5* 7.5* 6.8*  HCT 19.5* 17.4* 24.1* 21.7* 19.4*  PLT 264 243  --  239 236    COAGS: Recent Labs    11/14/17 1927  INR 1.25    BMP: Recent Labs    11/14/17 1927 11/15/17 0022 11/16/17 0356 11/17/17 0455  NA 137 137 136 135  K 4.3 4.1 5.0 4.2  CL 105 105 103 98*  CO2 15* 13* 17* 20*  GLUCOSE 93 87 89 101*  BUN 155* 158* 159* 160*  CALCIUM 9.3 8.9  8.9 9.0 8.3*  CREATININE 17.40* 18.63* 19.10* 18.43*  GFRNONAA 3* 2* 2* 2*  GFRAA 3* 3* 3* 3*    LIVER FUNCTION TESTS: Recent Labs    11/13/17 0959 11/14/17 1927 11/15/17 0022  BILITOT <0.2 0.7 0.4  AST 14 11* 11*  ALT 9 13* 11*  ALKPHOS 109 92 86  PROT 7.4 8.2* 7.5  ALBUMIN 3.9 3.5 3.2*    Assessment and Plan:  Obstructive uropathy with bilateral hydronephrosis. S/P bilateral percutaneous nephrostomy tube placement 11/15/2017 by Dr. Pascal Lux. Output R>L, drainage consistency remains bloody. Cr 18.43 this AM, trending down from 19.10 yesterday. Continue qshift flushing. IR  to follow.  Electronically Signed: Earley Abide, PA-C 11/17/2017, 12:32 PM   I spent a total of 15 Minutes at the the patient's bedside AND on the patient's hospital floor or unit, greater than 50% of which was counseling/coordinating care for obstructive uropathy with bilateral hydronephrosis.

## 2017-11-18 LAB — TYPE AND SCREEN
ABO/RH(D): B POS
ANTIBODY SCREEN: NEGATIVE
UNIT DIVISION: 0
Unit division: 0
Unit division: 0

## 2017-11-18 LAB — CBC
HCT: 20.8 % — ABNORMAL LOW (ref 39.0–52.0)
Hemoglobin: 7.3 g/dL — ABNORMAL LOW (ref 13.0–17.0)
MCH: 25.5 pg — ABNORMAL LOW (ref 26.0–34.0)
MCHC: 35.1 g/dL (ref 30.0–36.0)
MCV: 72.7 fL — AB (ref 78.0–100.0)
PLATELETS: 226 10*3/uL (ref 150–400)
RBC: 2.86 MIL/uL — AB (ref 4.22–5.81)
RDW: 15.8 % — ABNORMAL HIGH (ref 11.5–15.5)
WBC: 13.7 10*3/uL — ABNORMAL HIGH (ref 4.0–10.5)

## 2017-11-18 LAB — BPAM RBC
BLOOD PRODUCT EXPIRATION DATE: 201905302359
Blood Product Expiration Date: 201906042359
Blood Product Expiration Date: 201906052359
ISSUE DATE / TIME: 201905150817
ISSUE DATE / TIME: 201905150216
ISSUE DATE / TIME: 201905170758
UNIT TYPE AND RH: 7300
UNIT TYPE AND RH: 7300
Unit Type and Rh: 7300

## 2017-11-18 LAB — BASIC METABOLIC PANEL
Anion gap: 18 — ABNORMAL HIGH (ref 5–15)
BUN: 154 mg/dL — AB (ref 6–20)
CALCIUM: 8 mg/dL — AB (ref 8.9–10.3)
CHLORIDE: 96 mmol/L — AB (ref 101–111)
CO2: 23 mmol/L (ref 22–32)
CREATININE: 17.6 mg/dL — AB (ref 0.61–1.24)
GFR calc non Af Amer: 3 mL/min — ABNORMAL LOW (ref >60)
GFR, EST AFRICAN AMERICAN: 3 mL/min — AB (ref >60)
GLUCOSE: 131 mg/dL — AB (ref 65–99)
Potassium: 3.2 mmol/L — ABNORMAL LOW (ref 3.5–5.1)
Sodium: 137 mmol/L (ref 135–145)

## 2017-11-18 LAB — URIC ACID: Uric Acid, Serum: 14.5 mg/dL — ABNORMAL HIGH (ref 4.4–7.6)

## 2017-11-18 MED ORDER — PREDNISONE 20 MG PO TABS
40.0000 mg | ORAL_TABLET | Freq: Once | ORAL | Status: AC
  Administered 2017-11-18: 40 mg via ORAL
  Filled 2017-11-18: qty 2

## 2017-11-18 NOTE — Progress Notes (Signed)
PROGRESS NOTE    Devon MCCLENNY  LOV:[id number] DOB: Apr 16, 1961 DOA: 11/14/2017 PCP: [name], MD  Brief Narrative:57 year old male with history of hypertension and hep C admitted with malaise and difficulty voiding for 1-2 months, saw  PCP was treated with couple of rounds of antibiotics, had labs drawn yesterday which revealed a creatinine of 17 and was referred to the emergency room -CT revealed bilateral hydronephrosis at the level of the bladder with diffuse bladder wall thickening, bulky retroperitoneal lymphadenopathy -urology consulted, IR consulted, status post bilateral percutaneous nephrostomy tubes 5/15 a.m.   Assessment & Plan:   AKI (acute renal failure) (HCC) -postobstructive primarily, bilateral hydronephrosis, bladder CA suspected based on imaging -Was also on lisinopril and taking NSAIDs prior to admission -Baseline creatinine was normal in 2017 -Urology following, interventional radiology consulted -status post bilateral nephrostomy tubes 5/15 a.m, urine output of 3 L 5/15 and 2.7L on 5/16, -Continues to make good amounts of urine, 4.7 L yesterday however no significant change in creatinine yet -No acute indication for dialysis yet, continue renal diet, renal and urology following -continue to monitor  Bilateral severe hydronephrosis, bladder wall thickening and bulky retroperitoneal lymphadenopathy, status post bilateral nephrostomies as listed above -All this is concerning for  Metastatic bladder CA -Urology following, will need cystoscopy and biopsy, plan for this to be done as outpatient  Anemia -Acute, last labs show hemoglobin of 15 from 2017 -Suspect this is primarily secondary to possible underlying bladder CA -However patient also gives history of melena/black stools for few days prior to admission, was Hemoccult positive in the emergency room, GI consulted,  plan to defer GI workup at this time due to multitude of other active medical problems, no  further melena -hemoglobin dropped to 6.8 today I suspect partly dilutional from IV fluids and has low-grade hematuria from nephrostomy tubes, transfused 1 unit PRBC 5/17  Bilateral ankle pain -Suspect mild gouty arthritis and the current context with renal failure -Check uric acid, will consider prednisone tomorrow if needed  History of hepatitis C -status post Harvoni Rx  DVT prophylaxis: SCDs Code Status: Full code Family Communication: updated wife at bedside yesterday Disposition Plan: pending improvement in kidney function  Consultants:   IR  Renal  Urology   Procedures: Bilateral PCNs 5/15  Antimicrobials:    Subjective: -feels little better today, wants to walk around -Appetite is okay, no dyspnea  Objective: Vitals:   11/17/17 2039 11/17/17 2100 11/18/17 0030 11/18/17 0635  BP: 125/86  128/77 116/78  Pulse:      Resp: 16  16 13   Temp: 99.8 F (37.7 C)  99.2 F (37.3 C)   TempSrc:   Oral   SpO2:      Weight:  135.6 kg (298 lb 15.1 oz)    Height:        Intake/Output Summary (Last 24 hours) at 11/18/2017 1209 Last data filed at 11/18/2017 1100 Gross per 24 hour  Intake 453.33 ml  Output 4050 ml  Net -3596.67 ml   Filed Weights   11/15/17 0605 11/17/17 2100  Weight: (!) 136.4 kg (300 lb 11.3 oz) 135.6 kg (298 lb 15.1 oz)    Examination:  Gen: well-built African-American male, laying in bed, no distress HEENT: PERRLA, Neck supple, no JVD Lungs: decreased breath sounds  CVS: RRR,No Gallops,Rubs or new Murmurs OAC:ZYSA, Non tender, non distended, BS present, bilateral percutaneous nephrostomy tubes Extremities: 1+ edema, mild tenderness at both ankle joints today Skin: no new rashes Psychiatry: Judgement and insight  appear normal. Mood & affect appropriate.     Data Reviewed:   CBC: Recent Labs  Lab 11/13/17 0959  11/14/17 1927 11/15/17 0022 11/15/17 1507 11/16/17 0356 11/17/17 0455 11/18/17 0450  WBC 12.3*  --  11.2* 11.5*  --   14.2* 13.3* 13.7*  NEUTROABS 8.3*  --  8.0* 8.1*  --   --   --   --   HGB 6.8*   < > 6.8* 6.1* 8.5* 7.5* 6.8* 7.3*  HCT 20.2*   < > 19.5* 17.4* 24.1* 21.7* 19.4* 20.8*  MCV 73*  --  69.1* 69.0*  --  73.1* 71.6* 72.7*  PLT 253  --  264 243  --  239 236 226   < > = values in this interval not displayed.   Basic Metabolic Panel: Recent Labs  Lab 11/14/17 1927 11/15/17 0022 11/16/17 0356 11/17/17 0455 11/18/17 0450  NA 137 137 136 135 137  K 4.3 4.1 5.0 4.2 3.2*  CL 105 105 103 98* 96*  CO2 15* 13* 17* 20* 23  GLUCOSE 93 87 89 101* 131*  BUN 155* 158* 159* 160* 154*  CREATININE 17.40* 18.63* 19.10* 18.43* 17.60*  CALCIUM 9.3 8.9  8.9 9.0 8.3* 8.0*  PHOS  --  9.2*  --   --   --    GFR: Estimated Creatinine Clearance: 6.7 mL/min (A) (by C-G formula based on SCr of 17.6 mg/dL (H)). Liver Function Tests: Recent Labs  Lab 11/13/17 0959 11/14/17 1927 11/15/17 0022  AST 14 11* 11*  ALT 9 13* 11*  ALKPHOS 109 92 86  BILITOT <0.2 0.7 0.4  PROT 7.4 8.2* 7.5  ALBUMIN 3.9 3.5 3.2*   No results for input(s): LIPASE, AMYLASE in the last 168 hours. No results for input(s): AMMONIA in the last 168 hours. Coagulation Profile: Recent Labs  Lab 11/14/17 1927  INR 1.25   Cardiac Enzymes: No results for input(s): CKTOTAL, CKMB, CKMBINDEX, TROPONINI in the last 168 hours. BNP (last 3 results) No results for input(s): PROBNP in the last 8760 hours. HbA1C: Recent Labs    11/16/17 0356  HGBA1C 5.1   CBG: No results for input(s): GLUCAP in the last 168 hours. Lipid Profile: No results for input(s): CHOL, HDL, LDLCALC, TRIG, CHOLHDL, LDLDIRECT in the last 72 hours. Thyroid Function Tests: No results for input(s): TSH, T4TOTAL, FREET4, T3FREE, THYROIDAB in the last 72 hours. Anemia Panel: No results for input(s): VITAMINB12, FOLATE, FERRITIN, TIBC, IRON, RETICCTPCT in the last 72 hours. Urine analysis:    Component Value Date/Time   LABSPEC 1.015 11/13/2017 1209   BILIRUBINUR  negative 11/13/2017 1209   BILIRUBINUR neg 09/07/2015 1445   KETONESUR negative 11/13/2017 1209   PROTEINUR negative 11/13/2017 1209   PROTEINUR 1+ 0.3 09/07/2015 1445   UROBILINOGEN 4.0 09/07/2015 1445   NITRITE Negative 11/13/2017 1209   NITRITE neg 09/07/2015 1445   LEUKOCYTESUR Negative 11/13/2017 1209   Sepsis Labs: @LABRCNTIP (procalcitonin:4,lacticidven:4)  ) Recent Results (from the past 240 hour(s))  MRSA PCR Screening     Status: None   Collection Time: 11/15/17  9:15 PM  Result Value Ref Range Status   MRSA by PCR NEGATIVE NEGATIVE Final    Comment:        The GeneXpert MRSA Assay (FDA approved for NASAL specimens only), is one component of a comprehensive MRSA colonization surveillance program. It is not intended to diagnose MRSA infection nor to guide or monitor treatment for MRSA infections. Performed at [facility], [place]  [address] [place],  [address]          Radiology Studies: No results found.      Scheduled Meds: . multivitamin with minerals  1 tablet Oral Daily  . pantoprazole  40 mg Oral BID  . sodium chloride flush  5 mL Intracatheter Q8H   Continuous Infusions: .  sodium bicarbonate (isotonic) infusion in sterile water 100 mL/hr at 11/18/17 1047     LOS: 4 days    Time spent: 68min    Domenic Polite, MD [facility] Page via [email or web address], password [email or web address] After 7PM please contact night-coverage  11/18/2017, 12:09 PM

## 2017-11-18 NOTE — Progress Notes (Signed)
Referring Physician(s): Manny,T  Supervising Physician: Arne Cleveland  Patient Status:  [facility] - In-pt  Chief Complaint: Bilateral hydronephrosis, renal failure   Subjective: Pt c/o bil ankle pain, mild bil flank soreness   Allergies: Patient has no known allergies.  Medications: Prior to Admission medications   Medication Sig Start Date End Date Taking? Authorizing Provider  lisinopril-hydrochlorothiazide (PRINZIDE,ZESTORETIC) 20-25 MG tablet Take 1 tablet by mouth daily. 11/13/17  Yes [name], MD  Multiple Vitamins-Minerals (MULTIVITAMIN WITH MINERALS) tablet Take 1 tablet by mouth daily.     Yes [provider]  ciprofloxacin (CIPRO) 500 MG tablet Take 1 tablet (500 mg total) by mouth 2 (two) times daily. Patient not taking: Reported on 11/13/2017 10/11/17   [name], MD  Ledipasvir-Sofosbuvir (HARVONI) 90-400 MG TABS Take 1 tablet by mouth daily. Patient not taking: Reported on 09/25/2017 11/18/15   Thayer Headings, MD     Vital Signs: BP 116/78   Pulse 77   Temp 99.2 F (37.3 C) (Oral)   Resp 13   Ht 6' (1.829 m)   Wt 298 lb 15.1 oz (135.6 kg)   SpO2 100%   BMI 40.54 kg/m   Physical Exam L/R PCN's intact, outputs L- 1.9 liters, R- 2.9 liters bloody urine; insertion sites ok, mildly tender  Imaging: Ct Abdomen Pelvis Wo Contrast  Result Date: 11/14/2017 CLINICAL DATA:  Intermittent generalized abdominal pain with nausea, vomiting and diarrhea for 6 weeks. Acute anemia, renal failure and guaiac-positive stool EXAM: CT ABDOMEN AND PELVIS WITHOUT CONTRAST TECHNIQUE: Multidetector CT imaging of the abdomen and pelvis was performed following the standard protocol without IV contrast. COMPARISON:  None. FINDINGS: Lower chest: No acute abnormality. Hepatobiliary: No focal liver abnormality is seen. No gallstones, gallbladder wall thickening, or biliary dilatation. Pancreas: Unremarkable. No pancreatic ductal dilatation or surrounding inflammatory  changes. Spleen: Normal in size without focal abnormality. Adrenals/Urinary Tract: Adrenal glands are unremarkable. Bilateral hydronephrosis, moderate to severe in degree. Irregular thickening of the posterior walls of the bladder, the likely source of the bilateral hydroureter and hydronephrosis. Stomach/Bowel: Bowel is normal in caliber. No bowel wall thickening or evidence of bowel wall inflammation seen. Appendix is normal. Vascular/Lymphatic: Conglomerate lymphadenopathy throughout the retroperitoneum and along the bilateral iliac chain regions. No significant vascular abnormality. Reproductive: Prostate is unremarkable. Other: Periureteral fluid stranding. No circumscribed fluid collection or abscess like collection. No free intraperitoneal air. Musculoskeletal: Sclerotic focus at the anterior aspects of the L3 vertebral body. Osseous structures otherwise unremarkable. IMPRESSION: 1. Bilateral hydronephrosis, moderate to severe in degree, most likely caused by the irregular thickening seen along the posterior bladder walls with presumed obstruction at the bilateral UVJs. This is highly suspicious for neoplastic bladder wall thickening. Recommend urology consultation for possible cystoscopy. Percutaneous nephrostomy (or nephrostomies) may also be needed. 2. Conglomerate lymphadenopathy throughout the retroperitoneum and bilateral iliac chain regions, metastatic lymphadenopathy versus lymphoma. Consider tissue sampling. 3. Single sclerotic focus at the anterior aspects of the L3 vertebral body. This is most likely a benign bone island given the single site, however, early osseous metastasis cannot be excluded. Would consider nonemergent MRI or nuclear medicine bone scan for further characterization. Electronically Signed   By: Franki Cabot M.D.   On: 11/14/2017 22:06   Ir Nephrostomy Placement Left  Result Date: 11/15/2017 INDICATION: Concern for bladder cancer, now with obstructive bilateral uropathy.  Request made for placement of bilateral percutaneous nephrostomy catheters for renal preservation purposes. EXAM: 1. ULTRASOUND GUIDANCE FOR PUNCTURE OF THE  BILATERAL RENAL COLLECTING SYSTEMS 2. BILATERAL PERCUTANEOUS NEPHROSTOMY TUBE PLACEMENT. COMPARISON:  CT abdomen pelvis - 11/14/2017 MEDICATIONS: Rocephin 2 g IV;; the antibiotic was administered in an appropriate time frame prior to skin puncture. ANESTHESIA/SEDATION: Moderate (conscious) sedation was employed during this procedure. A total of Versed 3 mg and Fentanyl 175 mcg was administered intravenously. Moderate Sedation Time: 37 minutes. The patient's level of consciousness and vital signs were monitored continuously by radiology nursing throughout the procedure under my direct supervision. CONTRAST:  A total of approximately 20 mL Isovue 300 was administered into both renal collecting systems FLUOROSCOPY TIME:  5 minutes, 18 seconds (785 mGy) COMPLICATIONS: None immediate. PROCEDURE: The procedure, risks, benefits, and alternatives were explained to the patient. Questions regarding the procedure were encouraged and answered. The patient understands and consents to the procedure. A timeout was performed prior to the initiation of the procedure. The bilateral flanks were prepped and draped in usual sterile fashion with a sterile drape was applied covering the operative field. A sterile gown and sterile gloves were used for the procedure. Local anesthesia was provided with 1% Lidocaine with epinephrine. Ultrasound was used to localize the left kidney. Under direct ultrasound guidance, a 21 gauge needle was advanced into the renal collecting system. An ultrasound image documentation was performed. Access within the collecting system was confirmed with the efflux of urine followed by contrast injection. Over a Nitrex wire, the tract was dilated with an Accustick stent. Contrast injection confirmed appropriate positioning. Next, over a short Amplatz wire and  under intermittent fluoroscopic guidance, the track was dilated allowing placement of a 10-French percutaneous nephrostomy catheter with end coiled and locked within the left renal pelvis. Contrast was injected and several sport radiographs were obtained in various obliquities confirming access. The catheter was secured at the skin with a Prolene retention suture and a gravity bag was placed. The identical procedure was then performed for the right kidney ultimately allowing placement of a 10 French percutaneous nephrostomy catheter via a right posterior inferior calyx with and coiled and locked within right renal pelvis. Both nephrostomy catheters were connected to gravity bags and dressings were placed. Patient tolerated the procedure well without immediate postprocedural complication. FINDINGS: Ultrasound scanning demonstrates a moderate to severely dilated collecting systems bilaterally. Under direct ultrasound guidance, posterior inferior calices were targeted bilaterally allowing placement of 10-French percutaneous nephrostomy catheters under intermittent fluoroscopic guidance. Bilateral percutaneous nephrostomy catheters are coiled within the bilateral renal pelvis ease. Contrast injection confirmed appropriate positioning. IMPRESSION: Successful ultrasound and fluoroscopic guided placement of bilateral 10 French percutaneous nephrostomy catheters. PLAN: - Maintain bilateral nephrostomy catheters to gravity bags. Recommend Q shift flushing with approximately 10 cc of saline until urine is no longer blood tinged. - Would allow for 2-3 weeks of track maturation prior to proceeding with definitive bilateral nephrostograms and attempted placement of antegrade ureteral stents. Electronically Signed   By: Sandi Mariscal M.D.   On: 11/15/2017 12:54   Ir Nephrostomy Placement Right  Result Date: 11/15/2017 Sandi Mariscal, MD     11/15/2017 11:51 AM Pre Procedure Dx: Hydronephrosis Post Procedural Dx: Same Successful [place]  and fluoroscopic guided placement of bilateral 10 Fr PCNs with ends coiled and locked within the bilateral renal pelvis. Both PCNs connected to gravity bags. EBL: Minimal Complications: None immediate. Ronny Bacon, MD Pager #: [phone]     Labs:  CBC: Recent Labs    11/15/17 0022 11/15/17 1507 11/16/17 0356 11/17/17 0455 11/18/17 0450  WBC 11.5*  --  14.2* 13.3*  13.7*  HGB 6.1* 8.5* 7.5* 6.8* 7.3*  HCT 17.4* 24.1* 21.7* 19.4* 20.8*  PLT 243  --  239 236 226    COAGS: Recent Labs    11/14/17 1927  INR 1.25    BMP: Recent Labs    11/15/17 0022 11/16/17 0356 11/17/17 0455 11/18/17 0450  NA 137 136 135 137  K 4.1 5.0 4.2 3.2*  CL 105 103 98* 96*  CO2 13* 17* 20* 23  GLUCOSE 87 89 101* 131*  BUN 158* 159* 160* 154*  CALCIUM 8.9  8.9 9.0 8.3* 8.0*  CREATININE 18.63* 19.10* 18.43* 17.60*  GFRNONAA 2* 2* 2* 3*  GFRAA 3* 3* 3* 3*    LIVER FUNCTION TESTS: Recent Labs    11/13/17 0959 11/14/17 1927 11/15/17 0022  BILITOT <0.2 0.7 0.4  AST 14 11* 11*  ALT 9 13* 11*  ALKPHOS 109 92 86  PROT 7.4 8.2* 7.5  ALBUMIN 3.9 3.5 3.2*    Assessment and Plan: Pt with hx bilat hydronephrosis/UVJ obst, , ARF, bladder wall thickening; s/p bil [facility] 5[facility]; afebrile; WBC 13.7(13.3), hgb 7.3(6.8)- ? transfuse, creat 17.6(15.4); send PCN urine for cx/cytology; other plans as outlined by Dr. Jerold Coombe; check uric acid level-?gout   Electronically Signed: D. Rowe Robert, PA-C 11/18/2017, 10:47 AM   I spent a total of 15 minutes at the the patient's bedside AND on the patient's hospital floor or unit, greater than 50% of which was counseling/coordinating care for bilateral nephrostomies    Patient ID: Devon Cook, Devon Cook   DOB: Feb 20, 1961, 57 y.o.   MRN: [id number]

## 2017-11-18 NOTE — Progress Notes (Signed)
[facility] Progress Note  Subjective: no new c/o good uop and creat down 17 today  Vitals:   11/17/17 2039 11/17/17 2100 11/18/17 0030 11/18/17 0635  BP: 125/86  128/77 116/78  Pulse:      Resp: 16  16 13   Temp: 99.8 F (37.7 C)  99.2 F (37.3 C)   TempSrc:   Oral   SpO2:      Weight:  135.6 kg (298 lb 15.1 oz)    Height:        Inpatient medications: . multivitamin with minerals  1 tablet Oral Daily  . pantoprazole  40 mg Oral BID  . sodium chloride flush  5 mL Intracatheter Q8H   .  sodium bicarbonate (isotonic) infusion in sterile water 100 mL/hr at 11/18/17 1047   acetaminophen **OR** acetaminophen, calcium carbonate (dosed in mg elemental calcium), camphor-menthol **AND** hydrOXYzine, docusate sodium, feeding supplement (NEPRO CARB STEADY), hydrALAZINE, morphine injection, ondansetron **OR** ondansetron (ZOFRAN) IV, sorbitol, zolpidem  Exam: Gen alert no distress No jvd or bruits Chest clear bilat to bases no rales or wheezing RRR no MRG Abd soft ntnd no mass or ascites +bs R and L flank PCN tubes draining bloody urine and foley with the same GU normal male Ext trace LE edema Neuro is alert and OX3 , no asterixis  UA 5/13 > negative   home meds:  lisinopril-HCTZ 20-25 qd  cipro 500 bid  MVI/ Harvoni  Impression: 1 Renal failure - bilat hydronephrosis per CT on admit.  SP bilat PCN on 5/15. Creat slowly improving post bilat PCN down to 17 today.  No indications for dialysis yet. Cont supportive care  2 Anemia - due to renal failure in part also guiac + 3 Abnormal bladder by CT/ abdominal LAN - seen by urology suspect malig will need cysto when more stable 4  Vol - lungs are clear 5  Hx hep C 6  Anemia - hb 7.3 transfusing as needed  Plan - as above   Kelly Splinter MD [place] pager [phone]   11/18/2017, 12:47 PM   Recent Labs  Lab 11/15/17 0022 11/16/17 0356 11/17/17 0455 11/18/17 0450  NA 137 136 135 137   K 4.1 5.0 4.2 3.2*  CL 105 103 98* 96*  CO2 13* 17* 20* 23  GLUCOSE 87 89 101* 131*  BUN 158* 159* 160* 154*  CREATININE 18.63* 19.10* 18.43* 17.60*  CALCIUM 8.9  8.9 9.0 8.3* 8.0*  PHOS 9.2*  --   --   --    Recent Labs  Lab 11/13/17 0959 11/14/17 1927 11/15/17 0022  AST 14 11* 11*  ALT 9 13* 11*  ALKPHOS 109 92 86  BILITOT <0.2 0.7 0.4  PROT 7.4 8.2* 7.5  ALBUMIN 3.9 3.5 3.2*   Recent Labs  Lab 11/13/17 0959  11/14/17 1927 11/15/17 0022  11/16/17 0356 11/17/17 0455 11/18/17 0450  WBC 12.3*   < > 11.2* 11.5*  --  14.2* 13.3* 13.7*  NEUTROABS 8.3*  --  8.0* 8.1*  --   --   --   --   HGB 6.8*   < > 6.8* 6.1*   < > 7.5* 6.8* 7.3*  HCT 20.2*   < > 19.5* 17.4*   < > 21.7* 19.4* 20.8*  MCV 73*   < > 69.1* 69.0*  --  73.1* 71.6* 72.7*  PLT 253   < > 264 243  --  239 236 226   < > = values in  this interval not displayed.   Iron/TIBC/Ferritin/ %Sat    Component Value Date/Time   IRON 191 (H) 10/20/2015 1354

## 2017-11-19 LAB — BASIC METABOLIC PANEL
Anion gap: 18 — ABNORMAL HIGH (ref 5–15)
BUN: 134 mg/dL — AB (ref 6–20)
CALCIUM: 7.9 mg/dL — AB (ref 8.9–10.3)
CHLORIDE: 91 mmol/L — AB (ref 101–111)
CO2: 26 mmol/L (ref 22–32)
CREATININE: 13.91 mg/dL — AB (ref 0.61–1.24)
GFR calc Af Amer: 4 mL/min — ABNORMAL LOW (ref >60)
GFR, EST NON AFRICAN AMERICAN: 3 mL/min — AB (ref >60)
Glucose, Bld: 178 mg/dL — ABNORMAL HIGH (ref 65–99)
Potassium: 3.7 mmol/L (ref 3.5–5.1)
Sodium: 135 mmol/L (ref 135–145)

## 2017-11-19 LAB — CBC
HCT: 20.8 % — ABNORMAL LOW (ref 39.0–52.0)
Hemoglobin: 7.3 g/dL — ABNORMAL LOW (ref 13.0–17.0)
MCH: 25.7 pg — ABNORMAL LOW (ref 26.0–34.0)
MCHC: 35.1 g/dL (ref 30.0–36.0)
MCV: 73.2 fL — ABNORMAL LOW (ref 78.0–100.0)
PLATELETS: 286 10*3/uL (ref 150–400)
RBC: 2.84 MIL/uL — AB (ref 4.22–5.81)
RDW: 15.6 % — ABNORMAL HIGH (ref 11.5–15.5)
WBC: 15 10*3/uL — AB (ref 4.0–10.5)

## 2017-11-19 LAB — URINE CULTURE
Culture: NO GROWTH
Culture: NO GROWTH

## 2017-11-19 MED ORDER — PREDNISONE 20 MG PO TABS
40.0000 mg | ORAL_TABLET | Freq: Once | ORAL | Status: AC
  Administered 2017-11-19: 40 mg via ORAL
  Filled 2017-11-19: qty 2

## 2017-11-19 MED ORDER — POLYETHYLENE GLYCOL 3350 17 G PO PACK
17.0000 g | PACK | Freq: Every day | ORAL | Status: DC
  Administered 2017-11-19: 17 g via ORAL
  Filled 2017-11-19 (×2): qty 1

## 2017-11-19 MED ORDER — HYDROCODONE-ACETAMINOPHEN 5-325 MG PO TABS
1.0000 | ORAL_TABLET | Freq: Four times a day (QID) | ORAL | Status: DC | PRN
  Administered 2017-11-19 – 2017-12-01 (×14): 1 via ORAL
  Filled 2017-11-19 (×15): qty 1

## 2017-11-19 MED ORDER — METOPROLOL TARTRATE 5 MG/5ML IV SOLN
5.0000 mg | INTRAVENOUS | Status: DC | PRN
  Administered 2017-11-19: 5 mg via INTRAVENOUS
  Filled 2017-11-19: qty 5

## 2017-11-19 NOTE — Progress Notes (Signed)
[facility] Progress Note  Subjective: " I feel better", uop still very good and creat down to 13.9 today  Vitals:   11/18/17 1835 11/18/17 2028 11/19/17 0000 11/19/17 0500  BP: (!) 151/89     Pulse: 78     Resp: 15     Temp:  100 F (37.8 C) 98.9 F (37.2 C)   TempSrc:  Oral Oral   SpO2: 98%     Weight:    134.5 kg (296 lb 8.3 oz)  Height:        Inpatient medications: . multivitamin with minerals  1 tablet Oral Daily  . pantoprazole  40 mg Oral BID  . polyethylene glycol  17 g Oral Daily  . sodium chloride flush  5 mL Intracatheter Q8H   .  sodium bicarbonate (isotonic) infusion in sterile water 100 mL/hr at 11/19/17 1023   acetaminophen **OR** [DISCONTINUED] acetaminophen, calcium carbonate (dosed in mg elemental calcium), camphor-menthol **AND** hydrOXYzine, docusate sodium, feeding supplement (NEPRO CARB STEADY), hydrALAZINE, HYDROcodone-acetaminophen, ondansetron **OR** ondansetron (ZOFRAN) IV, sorbitol, zolpidem  Exam: Gen alert no distress No jvd or bruits Chest clear bilat RRR no MRG Abd soft ntnd no mass or ascites +bs R and L flank PCN tubes draining bloody urine and foley with the same GU normal male Ext leg edema resolved  Neuro is alert and OX3  UA 5/13 > negative   home meds:  lisinopril-HCTZ 20-25 qd  cipro 500 bid  MVI/ Harvoni  Impression: 1 Renal failure - bilat hydronephrosis per CT on admit.  SP bilat PCN on 5/15. Creat slowly improving sp bilat PCN the first few days from 19 > 17 but now imrpoving at 13.9 today. Hopefully will come away with decent renal function. Will follow, cont IVF"s at 100/ hr for now.  2 Anemia - due to renal failure in part also guiac + 3 Abnormal bladder by CT/ abdominal LAN - seen by urology suspect malig will need cysto when more stable; PSA very high ~700 4  Vol - lungs are clear 5  Hx hep C 6  Anemia - hb 7.3 transfusing as needed  Plan - as above   Kelly Splinter MD [place] pager [phone]   11/19/2017, 3:47 PM   Recent Labs  Lab 11/15/17 0022  11/17/17 0455 11/18/17 0450 11/19/17 0342  NA 137   < > 135 137 135  K 4.1   < > 4.2 3.2* 3.7  CL 105   < > 98* 96* 91*  CO2 13*   < > 20* 23 26  GLUCOSE 87   < > 101* 131* 178*  BUN 158*   < > 160* 154* 134*  CREATININE 18.63*   < > 18.43* 17.60* 13.91*  CALCIUM 8.9  8.9   < > 8.3* 8.0* 7.9*  PHOS 9.2*  --   --   --   --    < > = values in this interval not displayed.   Recent Labs  Lab 11/13/17 0959 11/14/17 1927 11/15/17 0022  AST 14 11* 11*  ALT 9 13* 11*  ALKPHOS 109 92 86  BILITOT <0.2 0.7 0.4  PROT 7.4 8.2* 7.5  ALBUMIN 3.9 3.5 3.2*   Recent Labs  Lab 11/13/17 0959  11/14/17 1927 11/15/17 0022  11/17/17 0455 11/18/17 0450 11/19/17 0342  WBC 12.3*   < > 11.2* 11.5*   < > 13.3* 13.7* 15.0*  NEUTROABS 8.3*  --  8.0* 8.1*  --   --   --   --  HGB 6.8*   < > 6.8* 6.1*   < > 6.8* 7.3* 7.3*  HCT 20.2*   < > 19.5* 17.4*   < > 19.4* 20.8* 20.8*  MCV 73*   < > 69.1* 69.0*   < > 71.6* 72.7* 73.2*  PLT 253   < > 264 243   < > 236 226 286   < > = values in this interval not displayed.   Iron/TIBC/Ferritin/ %Sat    Component Value Date/Time   IRON 191 (H) 10/20/2015 1354

## 2017-11-19 NOTE — Progress Notes (Signed)
PROGRESS NOTE    Devon Cook  [id number]:[id number] DOB: Mar 13, 1961 DOA: 11/14/2017 PCP: [name], MD  Brief Narrative:57 year old male with history of hypertension and hep C admitted with malaise and difficulty voiding for 1-2 months, saw  PCP was treated with couple of rounds of antibiotics, had labs drawn yesterday which revealed a creatinine of 17 and was referred to the emergency room -CT revealed bilateral hydronephrosis at the level of the bladder with diffuse bladder wall thickening, bulky retroperitoneal lymphadenopathy -urology consulted, IR consulted, status post bilateral percutaneous nephrostomy tubes 5/15 a.m. -kidney function improving  Assessment & Plan:   AKI (acute renal failure) ([place]) -postobstructive primarily, bilateral hydronephrosis, bladder CA suspected based on imaging -Was also on lisinopril and taking NSAIDs prior to admission -Baseline creatinine was normal in 2017 -Urology following, interventional radiology consulted -status post bilateral nephrostomy tubes 5/15 a.m,  -very brisk urine output, creatinine finally improving -continue IV fluids with bicarbonate per nephrology -continue to monitor -DC Foley tomorrow  Bilateral severe hydronephrosis, bladder wall thickening and bulky retroperitoneal lymphadenopathy, status post bilateral nephrostomies as listed above -All this is concerning for  Metastatic bladder CA -Urology following, will need cystoscopy and biopsy, plan for this to be done as outpatient  Anemia -Acute, last labs show hemoglobin of 15 from 2017 -Suspect this is primarily secondary to possible underlying bladder CA -However patient also gives history of melena/black stools for few days prior to admission, was Hemoccult positive in the emergency room, GI consulted,  plan to defer GI workup at this time due to multitude of other active medical problems, no further melena -hemoglobin dropped to 6.8 today I suspect partly dilutional from IV  fluids and has low-grade hematuria from nephrostomy tubes, transfused 1 unit PRBC 5/17  Gouty arthritis -bilateral ankle pain -uric acid significantly elevated at 14, unable to use NSAIDs or colchicine due to severe AK I -Give prednisone today  History of hepatitis C -status post Harvoni Rx  DVT prophylaxis: SCDs Code Status: Full code Family Communication: updated wife at bedside yesterday Disposition Plan: pending improvement in kidney function  Consultants:   IR  Renal  Urology   Procedures: Bilateral PCNs 5/15  Antimicrobials:    Subjective: -an good spirits, ankle pain and swelling is improving, making large amount of urine in the nephrostomy tubes  Objective: Vitals:   11/18/17 1835 11/18/17 2028 11/19/17 0000 11/19/17 0500  BP: (!) 151/89     Pulse: 78     Resp: 15     Temp:  100 F (37.8 C) 98.9 F (37.2 C)   TempSrc:  Oral Oral   SpO2: 98%     Weight:    134.5 kg (296 lb 8.3 oz)  Height:        Intake/Output Summary (Last 24 hours) at 11/19/2017 1344 Last data filed at 11/19/2017 1301 Gross per 24 hour  Intake 265 ml  Output 6635 ml  Net -6370 ml   Filed Weights   11/15/17 0605 11/17/17 2100 11/19/17 0500  Weight: (!) 136.4 kg (300 lb 11.3 oz) 135.6 kg (298 lb 15.1 oz) 134.5 kg (296 lb 8.3 oz)    Examination:  Gen: Awake, Alert, Oriented X 3,  HEENT: PERRLA, Neck supple, no JVD Lungs: Good air movement bilaterally, CTAB CVS: RRR,No Gallops,Rubs or new Murmurs Abd: soft, Non tender, non distended, BS present, bilateral percutaneous nephrostomy tubes with clear urine Extremities: 1+ edema, mild tenderness at both ankle joints  Skin: no new rashes Psychiatry: Judgement and insight appear  normal. Mood & affect appropriate.     Data Reviewed:   CBC: Recent Labs  Lab 11/13/17 0959  11/14/17 1927 11/15/17 0022 11/15/17 1507 11/16/17 0356 11/17/17 0455 11/18/17 0450 11/19/17 0342  WBC 12.3*   < > 11.2* 11.5*  --  14.2* 13.3* 13.7*  15.0*  NEUTROABS 8.3*  --  8.0* 8.1*  --   --   --   --   --   HGB 6.8*   < > 6.8* 6.1* 8.5* 7.5* 6.8* 7.3* 7.3*  HCT 20.2*   < > 19.5* 17.4* 24.1* 21.7* 19.4* 20.8* 20.8*  MCV 73*   < > 69.1* 69.0*  --  73.1* 71.6* 72.7* 73.2*  PLT 253   < > 264 243  --  239 236 226 286   < > = values in this interval not displayed.   Basic Metabolic Panel: Recent Labs  Lab 11/15/17 0022 11/16/17 0356 11/17/17 0455 11/18/17 0450 11/19/17 0342  NA 137 136 135 137 135  K 4.1 5.0 4.2 3.2* 3.7  CL 105 103 98* 96* 91*  CO2 13* 17* 20* 23 26  GLUCOSE 87 89 101* 131* 178*  BUN 158* 159* 160* 154* 134*  CREATININE 18.63* 19.10* 18.43* 17.60* 13.91*  CALCIUM 8.9  8.9 9.0 8.3* 8.0* 7.9*  PHOS 9.2*  --   --   --   --    GFR: Estimated Creatinine Clearance: 8.4 mL/min (A) (by C-G formula based on SCr of 13.91 mg/dL (H)). Liver Function Tests: Recent Labs  Lab 11/13/17 0959 11/14/17 1927 11/15/17 0022  AST 14 11* 11*  ALT 9 13* 11*  ALKPHOS 109 92 86  BILITOT <0.2 0.7 0.4  PROT 7.4 8.2* 7.5  ALBUMIN 3.9 3.5 3.2*   No results for input(s): LIPASE, AMYLASE in the last 168 hours. No results for input(s): AMMONIA in the last 168 hours. Coagulation Profile: Recent Labs  Lab 11/14/17 1927  INR 1.25   Cardiac Enzymes: No results for input(s): CKTOTAL, CKMB, CKMBINDEX, TROPONINI in the last 168 hours. BNP (last 3 results) No results for input(s): PROBNP in the last 8760 hours. HbA1C: No results for input(s): HGBA1C in the last 72 hours. CBG: No results for input(s): GLUCAP in the last 168 hours. Lipid Profile: No results for input(s): CHOL, HDL, LDLCALC, TRIG, CHOLHDL, LDLDIRECT in the last 72 hours. Thyroid Function Tests: No results for input(s): TSH, T4TOTAL, FREET4, T3FREE, THYROIDAB in the last 72 hours. Anemia Panel: No results for input(s): VITAMINB12, FOLATE, FERRITIN, TIBC, IRON, RETICCTPCT in the last 72 hours. Urine analysis:    Component Value Date/Time   LABSPEC 1.015  11/13/2017 1209   BILIRUBINUR negative 11/13/2017 1209   BILIRUBINUR neg 09/07/2015 1445   KETONESUR negative 11/13/2017 1209   PROTEINUR negative 11/13/2017 1209   PROTEINUR 1+ 0.3 09/07/2015 1445   UROBILINOGEN 4.0 09/07/2015 1445   NITRITE Negative 11/13/2017 1209   NITRITE neg 09/07/2015 1445   LEUKOCYTESUR Negative 11/13/2017 1209   Sepsis Labs: @LABRCNTIP (procalcitonin:4,lacticidven:4)  ) Recent Results (from the past 240 hour(s))  MRSA PCR Screening     Status: None   Collection Time: 11/15/17  9:15 PM  Result Value Ref Range Status   MRSA by PCR NEGATIVE NEGATIVE Final    Comment:        The GeneXpert MRSA Assay (FDA approved for NASAL specimens only), is one component of a comprehensive MRSA colonization surveillance program. It is not intended to diagnose MRSA infection nor to guide or monitor treatment for  MRSA infections. Performed at [facility], [place] [address] [place], [place] [address]   Urine Culture     Status: None   Collection Time: 11/18/17 12:01 PM  Result Value Ref Range Status   Specimen Description KIDNEY LEFT  Final   Special Requests NONE  Final   Culture   Final    NO GROWTH Performed at [facility], [address], [place], [place] [address]    Report Status 11/19/2017 FINAL  Final  Urine Culture     Status: None   Collection Time: 11/18/17 12:02 PM  Result Value Ref Range Status   Specimen Description KIDNEY RIGHT  Final   Special Requests NONE  Final   Culture   Final    NO GROWTH Performed at [facility], [place] [address] [place], [place] [address]    Report Status 11/19/2017 FINAL  Final         Radiology Studies: No results found.      Scheduled Meds: . multivitamin with minerals  1 tablet Oral Daily  . pantoprazole  40 mg Oral BID  . polyethylene glycol  17 g Oral Daily  . sodium chloride flush  5 mL Intracatheter Q8H   Continuous Infusions: .  sodium bicarbonate (isotonic) infusion in  sterile water 100 mL/hr at 11/19/17 1023     LOS: 5 days    Time spent: 97min    Domenic Polite, MD [facility] Page via [email or web address], password [email or web address] After 7PM please contact night-coverage  11/19/2017, 1:44 PM

## 2017-11-20 ENCOUNTER — Encounter (HOSPITAL_COMMUNITY): Payer: Self-pay | Admitting: Interventional Radiology

## 2017-11-20 LAB — BASIC METABOLIC PANEL
Anion gap: 18 — ABNORMAL HIGH (ref 5–15)
BUN: 120 mg/dL — AB (ref 6–20)
CHLORIDE: 89 mmol/L — AB (ref 101–111)
CO2: 34 mmol/L — ABNORMAL HIGH (ref 22–32)
CREATININE: 12.36 mg/dL — AB (ref 0.61–1.24)
Calcium: 8.1 mg/dL — ABNORMAL LOW (ref 8.9–10.3)
GFR calc non Af Amer: 4 mL/min — ABNORMAL LOW (ref >60)
GFR, EST AFRICAN AMERICAN: 5 mL/min — AB (ref >60)
Glucose, Bld: 131 mg/dL — ABNORMAL HIGH (ref 65–99)
Potassium: 3.5 mmol/L (ref 3.5–5.1)
Sodium: 141 mmol/L (ref 135–145)

## 2017-11-20 LAB — CBC
HCT: 22.1 % — ABNORMAL LOW (ref 39.0–52.0)
Hemoglobin: 7.5 g/dL — ABNORMAL LOW (ref 13.0–17.0)
MCH: 25.5 pg — ABNORMAL LOW (ref 26.0–34.0)
MCHC: 33.9 g/dL (ref 30.0–36.0)
MCV: 75.2 fL — ABNORMAL LOW (ref 78.0–100.0)
PLATELETS: 262 10*3/uL (ref 150–400)
RBC: 2.94 MIL/uL — AB (ref 4.22–5.81)
RDW: 15.5 % (ref 11.5–15.5)
WBC: 19.5 10*3/uL — AB (ref 4.0–10.5)

## 2017-11-20 MED ORDER — ALLOPURINOL 100 MG PO TABS
100.0000 mg | ORAL_TABLET | Freq: Every day | ORAL | Status: DC
  Administered 2017-11-20 – 2017-12-02 (×13): 100 mg via ORAL
  Filled 2017-11-20 (×13): qty 1

## 2017-11-20 MED ORDER — SODIUM CHLORIDE 0.9 % IV SOLN
INTRAVENOUS | Status: DC
  Administered 2017-11-20 – 2017-11-29 (×18): via INTRAVENOUS

## 2017-11-20 MED ORDER — PREDNISONE 20 MG PO TABS
20.0000 mg | ORAL_TABLET | Freq: Once | ORAL | Status: AC
  Administered 2017-11-20: 20 mg via ORAL
  Filled 2017-11-20: qty 1

## 2017-11-20 MED ORDER — SODIUM CHLORIDE 0.9 % IV SOLN
INTRAVENOUS | Status: DC
  Filled 2017-11-20: qty 1000

## 2017-11-20 NOTE — Care Management Note (Addendum)
Case Management Note  Patient Details  Name: NEILSON OEHLERT MRN: [id number] Date of Birth: 24-Mar-1961  Subjective/Objective:   Admitted with ARI, anemia/ quaic positive stool, and found to have  bilateral hydronephrosis/ ? Bladder CA and LLE DVT. Hx of prostatitis, hepatitis , hypertension. From home with family. PTA independent with ADL's , no DME usage.  PCP: [facility] course .Marland Kitchen... Pt with  Extremely Elevated PSA / Likely Metastatic Prostate Cancer .Marland Kitchen..urology following.    Action/Plan: Transition to home when medically stable (improvement with creatinine). NCM will continue to monitor for disposition needs.  Expected Discharge Date:                  Expected Discharge Plan:  Home/Self Care  In-House Referral:     Discharge planning Services     Post Acute Care Choice:    Choice offered to:     DME Arranged:    DME Agency:     HH Arranged:    HH Agency:     Status of Service:  In process, will continue to follow  If discussed at Long Length of Stay Meetings, dates discussed:    Additional Comments:  Sharin Mons, RN 11/20/2017, 11:37 AM

## 2017-11-20 NOTE — Progress Notes (Addendum)
PROGRESS NOTE    Devon Cook  WHQ:[id number] DOB: 1961-04-04 DOA: 11/14/2017 PCP: [name], MD  Brief Narrative:57 year old male with history of hypertension and hep C admitted with malaise and difficulty voiding for 1-2 months, saw  PCP was treated with couple of rounds of antibiotics, had labs drawn yesterday which revealed a creatinine of 17 and was referred to the emergency room -CT revealed bilateral hydronephrosis at the level of the bladder with diffuse bladder wall thickening, bulky retroperitoneal lymphadenopathy -urology consulted, IR consulted, status post bilateral percutaneous nephrostomy tubes 5/15 a.m. -kidney function improving  Assessment & Plan:   AKI (acute renal failure) ([place]) -postobstructive primarily, bilateral hydronephrosis, bladder CA suspected based on imaging -Was also on lisinopril and taking NSAIDs PTA, baseline creatinine was normal in 2017 -Urology consulted, Renal and IR consulted -status post bilateral nephrostomy tubes 5/15 a.m,  -very brisk urine output, creatinine finally improving -continue IV fluids/bicarbonate per nephrology -DC Foley when ok with Urology  Bilateral severe hydronephrosis, bladder wall thickening and bulky retroperitoneal lymphadenopathy, status post bilateral nephrostomies as listed above -All this is concerning for  Metastatic bladder CA, PSA very High too -Urology following, will need cystoscopy and biopsy, plan for this to be done as outpatient  Anemia -Acute, last labs show hemoglobin of 15 from 2017 -Suspect this is primarily secondary to possible underlying bladder CA -However patient also gives history of melena/black stools for few days prior to admission, was Hemoccult positive in the emergency room, GI consulted,  plan to defer GI workup at this time due to multitude of other active medical problems, no further melena -transfused 1 unit PRBC 5/17 -hb stable, monitor  Gouty arthritis -bilateral ankle  pain -uric acid significantly elevated at 14, unable to use NSAIDs or colchicine due to severe AK I -improved after 2 doses of prednisone, add renal dose allopurinol  History of hepatitis C -status post Harvoni Rx  DVT prophylaxis: SCDs Code Status: Full code Family Communication: updated wife at bedside  Disposition Plan: pending improvement in kidney function  Consultants:   IR  Renal  Urology   Procedures: Bilateral PCNs 5/15  Antimicrobials:    Subjective: -feels better, pain and swelling in joints improving  Objective: Vitals:   11/19/17 1825 11/19/17 2124 11/20/17 0400 11/20/17 0435  BP: (!) 144/90 (!) 155/95 (!) 145/84   Pulse: 68 80 75   Resp: 16 17 18    Temp: 98.9 F (37.2 C) 99.7 F (37.6 C) 98.3 F (36.8 C)   TempSrc: Oral Oral Oral   SpO2: 98% 99% 94%   Weight:    134.3 kg (296 lb 1.2 oz)  Height:        Intake/Output Summary (Last 24 hours) at 11/20/2017 1145 Last data filed at 11/20/2017 6599 Gross per 24 hour  Intake 6275 ml  Output 6227 ml  Net 48 ml   Filed Weights   11/17/17 2100 11/19/17 0500 11/20/17 0435  Weight: 135.6 kg (298 lb 15.1 oz) 134.5 kg (296 lb 8.3 oz) 134.3 kg (296 lb 1.2 oz)    Examination:  Gen: Awake, Alert, Oriented X 3, no distress HEENT: PERRLA, Neck supple, no JVD Lungs: Good air movement bilaterally, CTAB CVS: RRR,No Gallops,Rubs or new Murmurs Abd: soft, Non tender, non distended, BS present,  bilateral percutaneous nephrostomy tubes with clear urine Extremities: 1+ edema, resolving tenderness - both ankle joints  Skin: no new rashes Psychiatry: Judgement and insight appear normal. Mood & affect appropriate.     Data Reviewed:  CBC: Recent Labs  Lab 11/14/17 1927 11/15/17 0022  11/16/17 0356 11/17/17 0455 11/18/17 0450 11/19/17 0342 11/20/17 0426  WBC 11.2* 11.5*  --  14.2* 13.3* 13.7* 15.0* 19.5*  NEUTROABS 8.0* 8.1*  --   --   --   --   --   --   HGB 6.8* 6.1*   < > 7.5* 6.8* 7.3* 7.3* 7.5*   HCT 19.5* 17.4*   < > 21.7* 19.4* 20.8* 20.8* 22.1*  MCV 69.1* 69.0*  --  73.1* 71.6* 72.7* 73.2* 75.2*  PLT 264 243  --  239 236 226 286 262   < > = values in this interval not displayed.   Basic Metabolic Panel: Recent Labs  Lab 11/15/17 0022 11/16/17 0356 11/17/17 0455 11/18/17 0450 11/19/17 0342 11/20/17 0426  NA 137 136 135 137 135 141  K 4.1 5.0 4.2 3.2* 3.7 3.5  CL 105 103 98* 96* 91* 89*  CO2 13* 17* 20* 23 26 34*  GLUCOSE 87 89 101* 131* 178* 131*  BUN 158* 159* 160* 154* 134* 120*  CREATININE 18.63* 19.10* 18.43* 17.60* 13.91* 12.36*  CALCIUM 8.9  8.9 9.0 8.3* 8.0* 7.9* 8.1*  PHOS 9.2*  --   --   --   --   --    GFR: Estimated Creatinine Clearance: 9.5 mL/min (A) (by C-G formula based on SCr of 12.36 mg/dL (H)). Liver Function Tests: Recent Labs  Lab 11/14/17 1927 11/15/17 0022  AST 11* 11*  ALT 13* 11*  ALKPHOS 92 86  BILITOT 0.7 0.4  PROT 8.2* 7.5  ALBUMIN 3.5 3.2*   No results for input(s): LIPASE, AMYLASE in the last 168 hours. No results for input(s): AMMONIA in the last 168 hours. Coagulation Profile: Recent Labs  Lab 11/14/17 1927  INR 1.25   Cardiac Enzymes: No results for input(s): CKTOTAL, CKMB, CKMBINDEX, TROPONINI in the last 168 hours. BNP (last 3 results) No results for input(s): PROBNP in the last 8760 hours. HbA1C: No results for input(s): HGBA1C in the last 72 hours. CBG: No results for input(s): GLUCAP in the last 168 hours. Lipid Profile: No results for input(s): CHOL, HDL, LDLCALC, TRIG, CHOLHDL, LDLDIRECT in the last 72 hours. Thyroid Function Tests: No results for input(s): TSH, T4TOTAL, FREET4, T3FREE, THYROIDAB in the last 72 hours. Anemia Panel: No results for input(s): VITAMINB12, FOLATE, FERRITIN, TIBC, IRON, RETICCTPCT in the last 72 hours. Urine analysis:    Component Value Date/Time   LABSPEC 1.015 11/13/2017 1209   BILIRUBINUR negative 11/13/2017 1209   BILIRUBINUR neg 09/07/2015 1445   KETONESUR negative  11/13/2017 1209   PROTEINUR negative 11/13/2017 1209   PROTEINUR 1+ 0.3 09/07/2015 1445   UROBILINOGEN 4.0 09/07/2015 1445   NITRITE Negative 11/13/2017 1209   NITRITE neg 09/07/2015 1445   LEUKOCYTESUR Negative 11/13/2017 1209   Sepsis Labs: @LABRCNTIP (procalcitonin:4,lacticidven:4)  ) Recent Results (from the past 240 hour(s))  MRSA PCR Screening     Status: None   Collection Time: 11/15/17  9:15 PM  Result Value Ref Range Status   MRSA by PCR NEGATIVE NEGATIVE Final    Comment:        The GeneXpert MRSA Assay (FDA approved for NASAL specimens only), is one component of a comprehensive MRSA colonization surveillance program. It is not intended to diagnose MRSA infection nor to guide or monitor treatment for MRSA infections. Performed at [facility], [place] [address], [place], [place] [address]   Urine Culture     Status: None  Collection Time: 11/18/17 12:01 PM  Result Value Ref Range Status   Specimen Description KIDNEY LEFT  Final   Special Requests NONE  Final   Culture   Final    NO GROWTH Performed at [facility], [address], [place], [place] [address]    Report Status 11/19/2017 FINAL  Final  Urine Culture     Status: None   Collection Time: 11/18/17 12:02 PM  Result Value Ref Range Status   Specimen Description KIDNEY RIGHT  Final   Special Requests NONE  Final   Culture   Final    NO GROWTH Performed at [facility], [place] [address] [place], [place] [address]    Report Status 11/19/2017 FINAL  Final         Radiology Studies: No results found.      Scheduled Meds: . multivitamin with minerals  1 tablet Oral Daily  . pantoprazole  40 mg Oral BID  . polyethylene glycol  17 g Oral Daily  . sodium chloride flush  5 mL Intracatheter Q8H   Continuous Infusions: .  sodium bicarbonate (isotonic) infusion in sterile water 100 mL/hr at 11/20/17 0922     LOS: 6 days    Time spent: 39min    Domenic Polite, MD [facility] Page via [email or web address], password [email or web address] After 7PM please contact night-coverage  11/20/2017, 11:45 AM

## 2017-11-20 NOTE — Progress Notes (Signed)
Subjective/Chief Complaint:   1 - Bilateral Hydronephrosis / Acute Renal Failure - Cr 17 by PCP labs on eval malaize 11/2017. Non-con CT with hydro to level of thickened bladder. Bilateral neph tubes placed 11/2017.  2 - Bladder Thickening / Retroperitoneal Lymphadenopathy / Rule Out Primary Bladder or Other GU Cancer - sigificant bladder trigone thickening by CT 11/2017. Denies h/o gross hematuria but microhematuria noted x several. No cysto yet.  3 - Extremely Elevated PSA / Likely Metastatic Prostate Cancer - Pt's grandfather with prostate cancer. PSA 11/2017 - 787 (extreme elevation).   PMH sig for f HTN and hep C s/p harvoni txt. 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   Today "Cornelio" is stable. UOP remains very high but overall GFR quite poor. PSA extremely high c/w metastatic prostate cancer.     Objective: Vital signs in last 24 hours: Temp:  [98.3 F (36.8 C)-99.7 F (37.6 C)] 98.3 F (36.8 C) (05/20 1516) Pulse Rate:  [68-80] 71 (05/20 1516) Resp:  [16-18] 17 (05/20 1516) BP: (143-155)/(80-95) 143/80 (05/20 1516) SpO2:  [94 %-99 %] 97 % (05/20 1516) Weight:  [134.3 kg (296 lb 1.2 oz)] 134.3 kg (296 lb 1.2 oz) (05/20 0435) Last BM Date: 11/19/17  Intake/Output from previous day: 05/19 0701 - 05/20 0700 In: 6275 [I.V.:6065] Out: 6077 [Urine:6075; Stool:2] Intake/Output this shift: Total I/O In: 0  Out: 850 [Urine:850]   General appearance: alert, cooperative, appears stated age and very pleasant.  Eyes: negative Nose: Nares normal. Septum midline. Mucosa normal. No drainage or sinus tenderness. Throat: lips, mucosa, and tongue normal; teeth and gums normal Neck: supple, symmetrical, trachea midline Back: symmetric, no curvature. ROM normal. No CVA tenderness., neph tubes in place with copious urine that is thing and some blodo tinged.  Resp: non-labored on room air.  Cardio: Nl rate Male genitalia:  normal Extremities: extremities normal, atraumatic, no cyanosis or edema Pulses: 2+ and symmetric Skin: Skin color, texture, turgor normal. No rashes or lesions Lymph nodes: Cervical, supraclavicular, and axillary nodes normal. Neurologic: Grossly normal  Lab Results:  Recent Labs    11/19/17 0342 11/20/17 0426  WBC 15.0* 19.5*  HGB 7.3* 7.5*  HCT 20.8* 22.1*  PLT 286 262   BMET Recent Labs    11/19/17 0342 11/20/17 0426  NA 135 141  K 3.7 3.5  CL 91* 89*  CO2 26 34*  GLUCOSE 178* 131*  BUN 134* 120*  CREATININE 13.91* 12.36*  CALCIUM 7.9* 8.1*   PT/INR No results for input(s): LABPROT, INR in the last 72 hours. ABG No results for input(s): PHART, HCO3 in the last 72 hours.  Invalid input(s): PCO2, PO2  Studies/Results: No results found.  Anti-infectives: Anti-infectives (From admission, onward)   Start     Dose/Rate Route Frequency Ordered Stop   11/16/17 0800  cefTRIAXone (ROCEPHIN) 2 g in sodium chloride 0.9 % 100 mL IVPB     2 g 200 mL/hr over 30 Minutes Intravenous To Radiology 11/15/17 1022 11/17/17 0800      Assessment/Plan:  1 - Bilateral Hydronephrosis / Acute Renal Failure - agree with neph tubes for now for likely severe GU level obstruction. Volume status good. He likely will have permanent severe GFR compromise from this insult. This appears due to likley locally advanced stage 4 prostate cancer.   2 - Bladder Thickening / Retroperitoneal Lymphadenopathy / Rule Out Primary Bladder or Other GU Cancer - discussed with pt plan for  OR cysto / retrogrades / bladder and biopsy in elective setting once over acute metabolic deragnement. I do NOT plan this during current hospitalization.   3 - Extremely Elevated PSA / Likely Metastatic Prostate Cancer -  Frankly discussed with pt today likely DX of advanced prostate cancer as source of all above problems. This will be confirmed at [place] in next few weeks, once confirmed he will need lifelong androgen  deprivation, he will not be cytotoxic chemo candidate with his very poor GFR. We discussed that this is likely incurable but very controllable with goal of symptom minimization and 10year+ life expectancy as long as responds to therapy.   Will follow, please call me directly with questions anytime.   [facility], [place] 11/20/2017

## 2017-11-20 NOTE — Progress Notes (Signed)
[facility] Rounding Note  Background: 57 yo man, PMH Hep C (Harvoni), HTN, ED. Urinary sx for a month, treated with cipro. On lisinopril PTA for HTN.  Sent to ED by PCP 5/14 for abnl labs - creatinine 19, Hb 6.8. Imaging revealed bilateral hydro, had bilateral PCN's 11/15/17 w/slow improvement in renal function.  Not sure of recent baseline (in 2017 was 0.87). Has PSA >700.   Assessment/Recommendations: 1. Renal failure. Not sure of most recent baseline (normal 2017). Creatinine 19 on admission, down to 12.36 as of today, falling somewhat slowly. Unsure how long has been obstructed.  Hopefully will continue to improve. Making 4-6 liters/day of urine. Getting IVF at 100 right now as bicarb drip. CO2>30. Change to normal saline. K fine. 2. Elevated PSA - 787. Highest I have ever seen. Has bladder thickening and LAN in the abdomen. Urology has seen pt, not sure if they are aware of his PSA 3. Anemia - rec'd PRBC's 3U since admission.    Subjective:  No new events Continues to make excellent urine Not able to concentrate, UOP up to 6L IVF at 100  Vitals:   11/19/17 1825 11/19/17 2124 11/20/17 0400 11/20/17 0435  BP: (!) 144/90 (!) 155/95 (!) 145/84   Pulse: 68 80 75   Resp: 16 17 18    Temp: 98.9 F (37.2 C) 99.7 F (37.6 C) 98.3 F (36.8 C)   TempSrc: Oral Oral Oral   SpO2: 98% 99% 94%   Weight:    134.3 kg (296 lb 1.2 oz)  Height:       Exam: Gen alert no distress No jvd or bruits Chest clear bilat RRR no MRG Abd soft ntnd  R and L flank PCN tubes draining bloody urine  No LE edema Neuro is alert and OX3   Inpatient medications: . allopurinol  100 mg Oral Daily  . multivitamin with minerals  1 tablet Oral Daily  . pantoprazole  40 mg Oral BID  . polyethylene glycol  17 g Oral Daily  . sodium chloride flush  5 mL Intracatheter Q8H   .  sodium bicarbonate (isotonic) infusion in sterile water 100 mL/hr at 11/20/17 0947   acetaminophen **OR** [DISCONTINUED]  acetaminophen, calcium carbonate (dosed in mg elemental calcium), camphor-menthol **AND** hydrOXYzine, docusate sodium, feeding supplement (NEPRO CARB STEADY), hydrALAZINE, HYDROcodone-acetaminophen, metoprolol tartrate, ondansetron **OR** ondansetron (ZOFRAN) IV, sorbitol, zolpidem   Recent Labs  Lab 11/15/17 0022  11/18/17 0450 11/19/17 0342 11/20/17 0426  NA 137   < > 137 135 141  K 4.1   < > 3.2* 3.7 3.5  CL 105   < > 96* 91* 89*  CO2 13*   < > 23 26 34*  GLUCOSE 87   < > 131* 178* 131*  BUN 158*   < > 154* 134* 120*  CREATININE 18.63*   < > 17.60* 13.91* 12.36*  CALCIUM 8.9  8.9   < > 8.0* 7.9* 8.1*  PHOS 9.2*  --   --   --   --    < > = values in this interval not displayed.   Recent Labs  Lab 11/14/17 1927 11/15/17 0022  AST 11* 11*  ALT 13* 11*  ALKPHOS 92 86  BILITOT 0.7 0.4  PROT 8.2* 7.5  ALBUMIN 3.5 3.2*   Recent Labs  Lab 11/14/17 1927 11/15/17 0022  11/18/17 0450 11/19/17 0342 11/20/17 0426  WBC 11.2* 11.5*   < > 13.7* 15.0* 19.5*  NEUTROABS 8.0* 8.1*  --   --   --   --  HGB 6.8* 6.1*   < > 7.3* 7.3* 7.5*  HCT 19.5* 17.4*   < > 20.8* 20.8* 22.1*  MCV 69.1* 69.0*   < > 72.7* 73.2* 75.2*  PLT 264 243   < > 226 286 262   < > = values in this interval not displayed.   Iron/TIBC/Ferritin/ %Sat    Component Value Date/Time   IRON 191 (H) 10/20/2015 [place], MD [facility] [phone] Pager 11/20/2017, 2:25 PM

## 2017-11-21 LAB — CBC
HCT: 21.8 % — ABNORMAL LOW (ref 39.0–52.0)
HEMOGLOBIN: 7.3 g/dL — AB (ref 13.0–17.0)
MCH: 25.3 pg — AB (ref 26.0–34.0)
MCHC: 33.5 g/dL (ref 30.0–36.0)
MCV: 75.7 fL — AB (ref 78.0–100.0)
PLATELETS: 275 10*3/uL (ref 150–400)
RBC: 2.88 MIL/uL — AB (ref 4.22–5.81)
RDW: 15.5 % (ref 11.5–15.5)
WBC: 19.1 10*3/uL — AB (ref 4.0–10.5)

## 2017-11-21 LAB — RENAL FUNCTION PANEL
ANION GAP: 15 (ref 5–15)
Albumin: 2.6 g/dL — ABNORMAL LOW (ref 3.5–5.0)
BUN: 110 mg/dL — ABNORMAL HIGH (ref 6–20)
CALCIUM: 7.4 mg/dL — AB (ref 8.9–10.3)
CHLORIDE: 91 mmol/L — AB (ref 101–111)
CO2: 33 mmol/L — AB (ref 22–32)
CREATININE: 10.04 mg/dL — AB (ref 0.61–1.24)
GFR, EST AFRICAN AMERICAN: 6 mL/min — AB (ref >60)
GFR, EST NON AFRICAN AMERICAN: 5 mL/min — AB (ref >60)
Glucose, Bld: 140 mg/dL — ABNORMAL HIGH (ref 65–99)
Phosphorus: 5.5 mg/dL — ABNORMAL HIGH (ref 2.5–4.6)
Potassium: 3.1 mmol/L — ABNORMAL LOW (ref 3.5–5.1)
Sodium: 139 mmol/L (ref 135–145)

## 2017-11-21 MED ORDER — POTASSIUM CHLORIDE CRYS ER 20 MEQ PO TBCR
40.0000 meq | EXTENDED_RELEASE_TABLET | Freq: Once | ORAL | Status: AC
  Administered 2017-11-21: 40 meq via ORAL
  Filled 2017-11-21: qty 2

## 2017-11-21 MED ORDER — POTASSIUM CHLORIDE CRYS ER 20 MEQ PO TBCR
20.0000 meq | EXTENDED_RELEASE_TABLET | Freq: Once | ORAL | Status: AC
  Administered 2017-11-21: 20 meq via ORAL
  Filled 2017-11-21: qty 1

## 2017-11-21 MED ORDER — HEPARIN SODIUM (PORCINE) 5000 UNIT/ML IJ SOLN
5000.0000 [IU] | Freq: Three times a day (TID) | INTRAMUSCULAR | Status: DC
  Administered 2017-11-21 – 2017-12-02 (×32): 5000 [IU] via SUBCUTANEOUS
  Filled 2017-11-21 (×29): qty 1

## 2017-11-21 MED ORDER — DARBEPOETIN ALFA 200 MCG/0.4ML IJ SOSY
200.0000 ug | PREFILLED_SYRINGE | INTRAMUSCULAR | Status: DC
  Administered 2017-11-21 – 2017-11-28 (×2): 200 ug via SUBCUTANEOUS
  Filled 2017-11-21 (×3): qty 0.4

## 2017-11-21 NOTE — Progress Notes (Addendum)
PROGRESS NOTE    Devon Cook  IOM:[id number] DOB: 1961/05/10 DOA: 11/14/2017 PCP: [name], MD  Brief Narrative:57 year old male with history of hypertension and hep C admitted with malaise and difficulty voiding for 1-2 months, saw  PCP was treated with couple of rounds of antibiotics, had labs drawn which revealed a creatinine of 19 and was referred to the emergency room -CT revealed bilateral hydronephrosis at the level of the bladder with diffuse bladder wall thickening, bulky retroperitoneal lymphadenopathy -urology consulted, IR consulted, status post bilateral percutaneous nephrostomy tubes 5/15  -kidney function improving slowly, Renal and Urology following -plan for Outpatient workup for suspected metastatic prostate CA  Assessment & Plan:   AKI (acute renal failure) ([place]) -postobstructive primarily, bilateral hydronephrosis, bladder CA suspected based on imaging -Was also on lisinopril and taking NSAIDs PTA, baseline creatinine was normal in 2017 -Urology consulted, Renal and IR consulted, creatinine peaked at 19 -status post bilateral nephrostomy tubes 5/15 a.m,  -very brisk urine output, creatinine finally improving down to 10 today -continue IV fluids per nephrology -DC Foley if ok with Urology  Bilateral severe hydronephrosis, bladder wall thickening and bulky retroperitoneal lymphadenopathy, status post bilateral nephrostomies as listed above -All this is concerning for  Metastatic bladder CA, PSA very High too -Urology following, will need cystoscopy and biopsy, plan for this to be done as outpatient  Anemia -Acute, last labs show hemoglobin of 15 from 2017 -Suspect this is primarily secondary to possible underlying bladder CA -However patient also gives history of melena/black stools for few days prior to admission, was Hemoccult positive in the emergency room, GI consulted,  plan to defer GI workup at this time due to multitude of other active medical  problems, no further melena -transfused 1 unit PRBC 5/17 -hb stable, monitor, transfuse if hb trends further  Gouty arthritis -bilateral ankle pain -uric acid significantly elevated at 14, unable to use NSAIDs or colchicine due to severe AK I -improved after 2 doses of prednisone, added renal dose allopurinol  History of hepatitis C -status post Harvoni Rx  DVT prophylaxis: resume heparin SQ Code Status: Full code Family Communication: updated wife at bedside  Disposition Plan: pending improvement in kidney function  Consultants:   IR  Renal  Urology   Procedures: Bilateral PCNs 5/15  Antimicrobials:    Subjective: -improving, ambulating better, joint pains improving  Objective: Vitals:   11/20/17 0435 11/20/17 1516 11/20/17 2102 11/21/17 0544  BP:  (!) 143/80 (!) 146/94 119/84  Pulse:  71 79 77  Resp:  17 17 18   Temp:  98.3 F (36.8 C) 98.1 F (36.7 C) 98 F (36.7 C)  TempSrc:  Oral    SpO2:  97% 99% 97%  Weight: 134.3 kg (296 lb 1.2 oz)   134.6 kg (296 lb 11.8 oz)  Height:        Intake/Output Summary (Last 24 hours) at 11/21/2017 1314 Last data filed at 11/21/2017 1000 Gross per 24 hour  Intake 1492.92 ml  Output 6450 ml  Net -4957.08 ml   Filed Weights   11/19/17 0500 11/20/17 0435 11/21/17 0544  Weight: 134.5 kg (296 lb 8.3 oz) 134.3 kg (296 lb 1.2 oz) 134.6 kg (296 lb 11.8 oz)    Examination: Gen: Awake, Alert, Oriented X 3, no distress HEENT: PERRLA, Neck supple, no JVD Lungs: Good air movement bilaterally, CTAB CVS: RRR,No Gallops,Rubs or new Murmurs Abd: soft, Non tender, non distended, BS present, bilateral percutaneous nephrostomy tubes with clear urine Extremities:  Trace  edema, resolving tenderness - both ankle joints  Skin: no new rashes Psychiatry: Judgement and insight appear normal. Mood & affect appropriate.     Data Reviewed:   CBC: Recent Labs  Lab 11/14/17 1927 11/15/17 0022  11/17/17 0455 11/18/17 0450  11/19/17 0342 11/20/17 0426 11/21/17 0507  WBC 11.2* 11.5*   < > 13.3* 13.7* 15.0* 19.5* 19.1*  NEUTROABS 8.0* 8.1*  --   --   --   --   --   --   HGB 6.8* 6.1*   < > 6.8* 7.3* 7.3* 7.5* 7.3*  HCT 19.5* 17.4*   < > 19.4* 20.8* 20.8* 22.1* 21.8*  MCV 69.1* 69.0*   < > 71.6* 72.7* 73.2* 75.2* 75.7*  PLT 264 243   < > 236 226 286 262 275   < > = values in this interval not displayed.   Basic Metabolic Panel: Recent Labs  Lab 11/15/17 0022  11/17/17 0455 11/18/17 0450 11/19/17 0342 11/20/17 0426 11/21/17 0507  NA 137   < > 135 137 135 141 139  K 4.1   < > 4.2 3.2* 3.7 3.5 3.1*  CL 105   < > 98* 96* 91* 89* 91*  CO2 13*   < > 20* 23 26 34* 33*  GLUCOSE 87   < > 101* 131* 178* 131* 140*  BUN 158*   < > 160* 154* 134* 120* 110*  CREATININE 18.63*   < > 18.43* 17.60* 13.91* 12.36* 10.04*  CALCIUM 8.9  8.9   < > 8.3* 8.0* 7.9* 8.1* 7.4*  PHOS 9.2*  --   --   --   --   --  5.5*   < > = values in this interval not displayed.   GFR: Estimated Creatinine Clearance: 11.7 mL/min (A) (by C-G formula based on SCr of 10.04 mg/dL (H)). Liver Function Tests: Recent Labs  Lab 11/14/17 1927 11/15/17 0022 11/21/17 0507  AST 11* 11*  --   ALT 13* 11*  --   ALKPHOS 92 86  --   BILITOT 0.7 0.4  --   PROT 8.2* 7.5  --   ALBUMIN 3.5 3.2* 2.6*   No results for input(s): LIPASE, AMYLASE in the last 168 hours. No results for input(s): AMMONIA in the last 168 hours. Coagulation Profile: Recent Labs  Lab 11/14/17 1927  INR 1.25   Cardiac Enzymes: No results for input(s): CKTOTAL, CKMB, CKMBINDEX, TROPONINI in the last 168 hours. BNP (last 3 results) No results for input(s): PROBNP in the last 8760 hours. HbA1C: No results for input(s): HGBA1C in the last 72 hours. CBG: No results for input(s): GLUCAP in the last 168 hours. Lipid Profile: No results for input(s): CHOL, HDL, LDLCALC, TRIG, CHOLHDL, LDLDIRECT in the last 72 hours. Thyroid Function Tests: No results for input(s): TSH,  T4TOTAL, FREET4, T3FREE, THYROIDAB in the last 72 hours. Anemia Panel: No results for input(s): VITAMINB12, FOLATE, FERRITIN, TIBC, IRON, RETICCTPCT in the last 72 hours. Urine analysis:    Component Value Date/Time   LABSPEC 1.015 11/13/2017 1209   BILIRUBINUR negative 11/13/2017 1209   BILIRUBINUR neg 09/07/2015 1445   KETONESUR negative 11/13/2017 1209   PROTEINUR negative 11/13/2017 1209   PROTEINUR 1+ 0.3 09/07/2015 1445   UROBILINOGEN 4.0 09/07/2015 1445   NITRITE Negative 11/13/2017 1209   NITRITE neg 09/07/2015 1445   LEUKOCYTESUR Negative 11/13/2017 1209   Sepsis Labs: @LABRCNTIP (procalcitonin:4,lacticidven:4)  ) Recent Results (from the past 240 hour(s))  MRSA PCR Screening  Status: None   Collection Time: 11/15/17  9:15 PM  Result Value Ref Range Status   MRSA by PCR NEGATIVE NEGATIVE Final    Comment:        The GeneXpert MRSA Assay (FDA approved for NASAL specimens only), is one component of a comprehensive MRSA colonization surveillance program. It is not intended to diagnose MRSA infection nor to guide or monitor treatment for MRSA infections. Performed at [facility], [place] [address] [place], [place] [address]   Urine Culture     Status: None   Collection Time: 11/18/17 12:01 PM  Result Value Ref Range Status   Specimen Description KIDNEY LEFT  Final   Special Requests NONE  Final   Culture   Final    NO GROWTH Performed at [facility], [address] [place], [place] [address]    Report Status 11/19/2017 FINAL  Final  Urine Culture     Status: None   Collection Time: 11/18/17 12:02 PM  Result Value Ref Range Status   Specimen Description KIDNEY RIGHT  Final   Special Requests NONE  Final   Culture   Final    NO GROWTH Performed at [facility], [place] [address] [place], [place] [address]    Report Status 11/19/2017 FINAL  Final         Radiology Studies: No results found.      Scheduled Meds: . allopurinol   100 mg Oral Daily  . multivitamin with minerals  1 tablet Oral Daily  . pantoprazole  40 mg Oral BID  . polyethylene glycol  17 g Oral Daily  . sodium chloride flush  5 mL Intracatheter Q8H   Continuous Infusions: . sodium chloride 125 mL/hr at 11/21/17 0618     LOS: 7 days    Time spent: 54min    Domenic Polite, MD [facility] Page via [email or web address], password [email or web address] After 7PM please contact night-coverage  11/21/2017, 1:14 PM

## 2017-11-21 NOTE — Progress Notes (Signed)
[facility] Rounding Note  Background: 57 yo man, PMH Hep C (Harvoni), HTN, ED. Urinary sx for a month, treated with cipro. On lisinopril PTA for HTN.  Sent to ED by PCP 5/14 for abnl labs - creatinine 19, Hb 6.8. Imaging revealed bilateral hydro, had bilateral PCN's 11/15/17 w/slow improvement in renal function.  Not sure of recent baseline (in 2017 was 0.87). Has PSA >700. Metastatic prostate CA.   Assessment/Recommendations: 1. Renal failure. Not sure of most recent baseline (normal 2017). Creatinine 19 on admission, down to 10 and still falling albeit somewhat slowly. Unsure how long has been obstructed.  Hopefully will continue to improve. Making 4-6 liters/day of urine. IVF's changed to normal saline at 125 on 5/20.  2. Hypokalemia - down to 3.1. Given 20 mEq po X1 by primary today. I think will need more. Give another 40 today and recheck in AM 3. Elevated PSA - 787. Highest I have ever seen. Has bladder thickening and LAN in the abdomen. Pt now aware of his Dx and prognosis (see Urology note) 4. Anemia - rec'd PRBC's 3U since admission. Hb 7.5. Dose Aranesp 200. Check Fe studies  Jamal Maes, MD [place] Pager 11/21/2017, 2:02 PM  Subjective:  Pt now aware of dx of metastatic prostate CA  Vitals:   11/20/17 1516 11/20/17 2102 11/21/17 0544 11/21/17 1341  BP: (!) 143/80 (!) 146/94 119/84 118/82  Pulse: 71 79 77 71  Resp: 17 17 18    Temp: 98.3 F (36.8 C) 98.1 F (36.7 C) 98 F (36.7 C) 98 F (36.7 C)  TempSrc: Oral   Oral  SpO2: 97% 99% 97% 99%  Weight:   134.6 kg (296 lb 11.8 oz)   Height:       Exam: Gen alert no distress No jvd Lungs clear Regular S1S2 NO S3 Abd soft, not tender R and L flank PCN tubes draining pink urine No LE edema Alert and OX3   Inpatient medications: . allopurinol  100 mg Oral Daily  . heparin injection (subcutaneous)  5,000 Units Subcutaneous Q8H  . multivitamin with minerals  1 tablet Oral  Daily  . pantoprazole  40 mg Oral BID  . polyethylene glycol  17 g Oral Daily  . sodium chloride flush  5 mL Intracatheter Q8H   . sodium chloride 125 mL/hr at 11/21/17 7494   acetaminophen **OR** [DISCONTINUED] acetaminophen, calcium carbonate (dosed in mg elemental calcium), camphor-menthol **AND** hydrOXYzine, docusate sodium, feeding supplement (NEPRO CARB STEADY), hydrALAZINE, HYDROcodone-acetaminophen, metoprolol tartrate, ondansetron **OR** ondansetron (ZOFRAN) IV, sorbitol, zolpidem   Recent Labs  Lab 11/15/17 0022  11/19/17 0342 11/20/17 0426 11/21/17 0507  NA 137   < > 135 141 139  K 4.1   < > 3.7 3.5 3.1*  CL 105   < > 91* 89* 91*  CO2 13*   < > 26 34* 33*  GLUCOSE 87   < > 178* 131* 140*  BUN 158*   < > 134* 120* 110*  CREATININE 18.63*   < > 13.91* 12.36* 10.04*  CALCIUM 8.9  8.9   < > 7.9* 8.1* 7.4*  PHOS 9.2*  --   --   --  5.5*   < > = values in this interval not displayed.   Recent Labs  Lab 11/14/17 1927 11/15/17 0022 11/21/17 0507  AST 11* 11*  --   ALT 13* 11*  --   ALKPHOS 92 86  --   BILITOT 0.7 0.4  --  PROT 8.2* 7.5  --   ALBUMIN 3.5 3.2* 2.6*   Recent Labs  Lab 11/14/17 1927 11/15/17 0022  11/19/17 0342 11/20/17 0426 11/21/17 0507  WBC 11.2* 11.5*   < > 15.0* 19.5* 19.1*  NEUTROABS 8.0* 8.1*  --   --   --   --   HGB 6.8* 6.1*   < > 7.3* 7.5* 7.3*  HCT 19.5* 17.4*   < > 20.8* 22.1* 21.8*  MCV 69.1* 69.0*   < > 73.2* 75.2* 75.7*  PLT 264 243   < > 286 262 275   < > = values in this interval not displayed.   Iron/TIBC/Ferritin/ %Sat    Component Value Date/Time   IRON 191 (H) 10/20/2015 [place], MD [facility] [phone] Pager 11/21/2017, 2:02 PM

## 2017-11-22 ENCOUNTER — Other Ambulatory Visit: Payer: Self-pay | Admitting: Urology

## 2017-11-22 LAB — RENAL FUNCTION PANEL
Albumin: 2.7 g/dL — ABNORMAL LOW (ref 3.5–5.0)
Anion gap: 14 (ref 5–15)
BUN: 95 mg/dL — AB (ref 6–20)
CO2: 29 mmol/L (ref 22–32)
CREATININE: 8.79 mg/dL — AB (ref 0.61–1.24)
Calcium: 8.2 mg/dL — ABNORMAL LOW (ref 8.9–10.3)
Chloride: 98 mmol/L — ABNORMAL LOW (ref 101–111)
GFR calc Af Amer: 7 mL/min — ABNORMAL LOW (ref >60)
GFR, EST NON AFRICAN AMERICAN: 6 mL/min — AB (ref >60)
Glucose, Bld: 91 mg/dL (ref 65–99)
POTASSIUM: 3.6 mmol/L (ref 3.5–5.1)
Phosphorus: 5.7 mg/dL — ABNORMAL HIGH (ref 2.5–4.6)
Sodium: 141 mmol/L (ref 135–145)

## 2017-11-22 LAB — CBC
HEMATOCRIT: 22.5 % — AB (ref 39.0–52.0)
Hemoglobin: 7.4 g/dL — ABNORMAL LOW (ref 13.0–17.0)
MCH: 25.1 pg — AB (ref 26.0–34.0)
MCHC: 32.9 g/dL (ref 30.0–36.0)
MCV: 76.3 fL — AB (ref 78.0–100.0)
PLATELETS: 290 10*3/uL (ref 150–400)
RBC: 2.95 MIL/uL — ABNORMAL LOW (ref 4.22–5.81)
RDW: 15.7 % — AB (ref 11.5–15.5)
WBC: 13.7 10*3/uL — ABNORMAL HIGH (ref 4.0–10.5)

## 2017-11-22 LAB — IRON AND TIBC
IRON: 23 ug/dL — AB (ref 45–182)
Saturation Ratios: 8 % — ABNORMAL LOW (ref 17.9–39.5)
TIBC: 272 ug/dL (ref 250–450)
UIBC: 249 ug/dL

## 2017-11-22 MED ORDER — FERUMOXYTOL INJECTION 510 MG/17 ML
510.0000 mg | INTRAVENOUS | Status: DC
Start: 2017-11-22 — End: 2017-11-25
  Administered 2017-11-22: 510 mg via INTRAVENOUS
  Filled 2017-11-22: qty 17

## 2017-11-22 NOTE — Progress Notes (Signed)
[facility] Rounding Note  Background: 57 yo man, PMH Hep C (Harvoni), HTN, ED. Urinary sx for a month, treated with cipro. On lisinopril PTA for HTN.  Sent to ED by PCP 5/14 for abnl labs - creatinine 19, Hb 6.8. Imaging revealed bilateral hydro, had bilateral PCN's 11/15/17 w/slow improvement in renal function.  Not sure of recent baseline (in 2017 was 0.87). Has PSA >700. Metastatic prostate CA.   Assessment/Recommendations: 1. Renal failure. Not sure of most recent baseline (normal 2017). Creatinine 19 on admission, down to 8.8 and improving daily.  Hopefully will continue to improve. Making 4-6 liters/day of urine. IVF's changed to normal saline at 125 on 5/20. No edema. I think in large part just post obstructive diuresis, not time to reduce fluids yet.  2. Hypokalemia - had total 60 mEq KDur yesterday, looks good today. Monitor.  3. Elevated PSA - 787. Highest I have ever seen. Has bladder thickening and LAN in the abdomen. Pt now aware of his Dx and prognosis (see Urology note) 4. Anemia - rec'd PRBC's 3U since admission. Hb 7.5. Dosed Aranesp 200 on 5/21. TSat 8. Give Feraheme X2.   Devon Maes, MD [facility] [phone] Pager 11/22/2017, 11:18 AM  Subjective:  Pt trying to be positive re dx of metastatic prostate CA Encouraged that renal function continues to get better No new complaints Foley out, just perc tubes now  Vitals:   11/21/17 1341 11/21/17 2120 11/22/17 0439 11/22/17 0500  BP: 118/82 129/88 116/77   Pulse: 71 71 72   Resp: 18     Temp: 98 F (36.7 C) 98.6 F (37 C) 98.7 F (37.1 C)   TempSrc: Oral Oral Oral   SpO2: 99% 99% 99%   Weight:    135.4 kg (298 lb 8.1 oz)  Height:       Exam: Large framed AAM VS as noted NAD No JVD Lung fields clear Abd soft, not tender Bilateral per tubes draining large amts urine No edema LE's foley out Alert and OX3   Inpatient medications: . allopurinol  100 mg Oral Daily  . darbepoetin  (ARANESP) injection - NON-DIALYSIS  200 mcg Subcutaneous Q Tue-1800  . heparin injection (subcutaneous)  5,000 Units Subcutaneous Q8H  . multivitamin with minerals  1 tablet Oral Daily  . pantoprazole  40 mg Oral BID  . polyethylene glycol  17 g Oral Daily  . sodium chloride flush  5 mL Intracatheter Q8H   . sodium chloride 125 mL/hr at 11/22/17 3710   acetaminophen **OR** [DISCONTINUED] acetaminophen, calcium carbonate (dosed in mg elemental calcium), camphor-menthol **AND** hydrOXYzine, docusate sodium, feeding supplement (NEPRO CARB STEADY), hydrALAZINE, HYDROcodone-acetaminophen, metoprolol tartrate, ondansetron **OR** ondansetron (ZOFRAN) IV, sorbitol, zolpidem   Recent Labs  Lab 11/20/17 0426 11/21/17 0507 11/22/17 0511  NA 141 139 141  K 3.5 3.1* 3.6  CL 89* 91* 98*  CO2 34* 33* 29  GLUCOSE 131* 140* 91  BUN 120* 110* 95*  CREATININE 12.36* 10.04* 8.79*  CALCIUM 8.1* 7.4* 8.2*  PHOS  --  5.5* 5.7*   Recent Labs  Lab 11/21/17 0507 11/22/17 0511  ALBUMIN 2.6* 2.7*   Recent Labs  Lab 11/20/17 0426 11/21/17 0507 11/22/17 0511  WBC 19.5* 19.1* 13.7*  HGB 7.5* 7.3* 7.4*  HCT 22.1* 21.8* 22.5*  MCV 75.2* 75.7* 76.3*  PLT 262 275 290   Iron/TIBC/Ferritin/ %Sat    Component Value Date/Time   IRON 23 (L) 11/22/2017 0511   TIBC 272 11/22/2017 0511  IRONPCTSAT 8 (L) 11/22/2017 0379

## 2017-11-22 NOTE — Progress Notes (Signed)
PROGRESS NOTE    Devon Cook  DQQ:[id number] DOB: 13-Dec-1960 DOA: 11/14/2017 PCP: [name], MD    Brief Narrative: :57 year old male with history of hypertension and hep C admitted with malaise and difficulty voiding for 1-2 months, saw  PCP was treated with couple of rounds of antibiotics, had labs drawn which revealed a creatinine of 19 and was referred to the emergency room -CT revealed bilateral hydronephrosis at the level of the bladder with diffuse bladder wall thickening, bulky retroperitoneal lymphadenopathy -urology consulted, IR consulted, status post bilateral percutaneous nephrostomy tubes 5/15  -kidney function improving slowly, Renal and Urology following -plan for Outpatient workup for suspected metastatic prostate CA    Assessment & Plan:   Principal Problem:   ARF (acute renal failure) ([place]) Active Problems:   Hypertension   Morbid obesity with BMI of 40.0-44.9, adult ([place])   History of hepatitis C   GI bleed   Acute blood loss anemia   1-AKI;  -postobstructive primarily, bilateral hydronephrosis, bladder CA suspected based on imaging -Lisinopril discontinue on admission.  -Urology consulted, Renal and IR consulted, creatinine peaked at 19 -status post bilateral nephrostomy tubes 5/15 a.m,  -cr trending down, good urine out put.    Bilateral severe hydronephrosis, bladder wall thickening and bulky retroperitoneal lymphadenopathy, status post bilateral nephrostomies as listed above Concerning with metastatic bladder ca and PSA elevated.  Urology recommends out patient cystoscopy and biopsy./  Patient aware of consideration and prognosis.   Anemia;  However patient also gives history of melena/black stools for few days prior to admission, was Hemoccult positive in the emergency room, GI consulted,  plan to defer GI workup at this time due to multitude of other active medical problems, no further melena Hb stable at 7.4.   Gouty Arthritis;    Improved after 2 doses of prednisone.   History of hepatitis C  status post Harvoni Rx     DVT prophylaxis: heparin  Code Status: full code.  Family Communication: care discussed with patient.  Disposition Plan: home when renal function improved.    Consultants:  Nephrology  Urology  IR   Procedures: Bilateral PCNs 5/15    Antimicrobials: none  Subjective: He is feeling well, denies dyspnea. Report passing a lot of gasses.   Objective: Vitals:   11/21/17 2120 11/22/17 0439 11/22/17 0500 11/22/17 1335  BP: 129/88 116/77  135/84  Pulse: 71 72  75  Resp:    20  Temp: 98.6 F (37 C) 98.7 F (37.1 C)  97.7 F (36.5 C)  TempSrc: Oral Oral  Oral  SpO2: 99% 99%  99%  Weight:   135.4 kg (298 lb 8.1 oz)   Height:        Intake/Output Summary (Last 24 hours) at 11/22/2017 1544 Last data filed at 11/22/2017 1505 Gross per 24 hour  Intake 4817.92 ml  Output 5385 ml  Net -567.08 ml   Filed Weights   11/20/17 0435 11/21/17 0544 11/22/17 0500  Weight: 134.3 kg (296 lb 1.2 oz) 134.6 kg (296 lb 11.8 oz) 135.4 kg (298 lb 8.1 oz)    Examination:  General exam: Appears calm and comfortable  Respiratory system: Clear to auscultation. Respiratory effort normal. Cardiovascular system: S1 & S2 heard, RRR. No JVD, murmurs, rubs, gallops or clicks. No pedal edema. Gastrointestinal system: Abdomen is nondistended, soft and nontender. No organomegaly or masses felt. Normal bowel sounds heard. GI; bilateral nephrostomy tube.  Central nervous system: Alert and oriented. No focal neurological deficits. Extremities: Symmetric  5 x 5 power. Skin: No rashes, lesions or ulcers    Data Reviewed: I have personally reviewed following labs and imaging studies  CBC: Recent Labs  Lab 11/18/17 0450 11/19/17 0342 11/20/17 0426 11/21/17 0507 11/22/17 0511  WBC 13.7* 15.0* 19.5* 19.1* 13.7*  HGB 7.3* 7.3* 7.5* 7.3* 7.4*  HCT 20.8* 20.8* 22.1* 21.8* 22.5*  MCV 72.7* 73.2* 75.2*  75.7* 76.3*  PLT 226 286 262 275 970   Basic Metabolic Panel: Recent Labs  Lab 11/18/17 0450 11/19/17 0342 11/20/17 0426 11/21/17 0507 11/22/17 0511  NA 137 135 141 139 141  K 3.2* 3.7 3.5 3.1* 3.6  CL 96* 91* 89* 91* 98*  CO2 23 26 34* 33* 29  GLUCOSE 131* 178* 131* 140* 91  BUN 154* 134* 120* 110* 95*  CREATININE 17.60* 13.91* 12.36* 10.04* 8.79*  CALCIUM 8.0* 7.9* 8.1* 7.4* 8.2*  PHOS  --   --   --  5.5* 5.7*   GFR: Estimated Creatinine Clearance: 13.4 mL/min (A) (by C-G formula based on SCr of 8.79 mg/dL (H)). Liver Function Tests: Recent Labs  Lab 11/21/17 0507 11/22/17 0511  ALBUMIN 2.6* 2.7*   No results for input(s): LIPASE, AMYLASE in the last 168 hours. No results for input(s): AMMONIA in the last 168 hours. Coagulation Profile: No results for input(s): INR, PROTIME in the last 168 hours. Cardiac Enzymes: No results for input(s): CKTOTAL, CKMB, CKMBINDEX, TROPONINI in the last 168 hours. BNP (last 3 results) No results for input(s): PROBNP in the last 8760 hours. HbA1C: No results for input(s): HGBA1C in the last 72 hours. CBG: No results for input(s): GLUCAP in the last 168 hours. Lipid Profile: No results for input(s): CHOL, HDL, LDLCALC, TRIG, CHOLHDL, LDLDIRECT in the last 72 hours. Thyroid Function Tests: No results for input(s): TSH, T4TOTAL, FREET4, T3FREE, THYROIDAB in the last 72 hours. Anemia Panel: Recent Labs    11/22/17 0511  TIBC 272  IRON 23*   Sepsis Labs: No results for input(s): PROCALCITON, LATICACIDVEN in the last 168 hours.  Recent Results (from the past 240 hour(s))  MRSA PCR Screening     Status: None   Collection Time: 11/15/17  9:15 PM  Result Value Ref Range Status   MRSA by PCR NEGATIVE NEGATIVE Final    Comment:        The GeneXpert MRSA Assay (FDA approved for NASAL specimens only), is one component of a comprehensive MRSA colonization surveillance program. It is not intended to diagnose MRSA infection nor to  guide or monitor treatment for MRSA infections. Performed at [facility], [place] [address], [place], [place] [address]   Urine Culture     Status: None   Collection Time: 11/18/17 12:01 PM  Result Value Ref Range Status   Specimen Description KIDNEY LEFT  Final   Special Requests NONE  Final   Culture   Final    NO GROWTH Performed at [facility], [address], [place], [place] [address]    Report Status 11/19/2017 FINAL  Final  Urine Culture     Status: None   Collection Time: 11/18/17 12:02 PM  Result Value Ref Range Status   Specimen Description KIDNEY RIGHT  Final   Special Requests NONE  Final   Culture   Final    NO GROWTH Performed at [facility], [place] [address] [place], [place] [address]    Report Status 11/19/2017 FINAL  Final  Radiology Studies: No results found.      Scheduled Meds: . allopurinol  100 mg Oral Daily  . darbepoetin (ARANESP) injection - NON-DIALYSIS  200 mcg Subcutaneous Q Tue-1800  . heparin injection (subcutaneous)  5,000 Units Subcutaneous Q8H  . multivitamin with minerals  1 tablet Oral Daily  . pantoprazole  40 mg Oral BID  . sodium chloride flush  5 mL Intracatheter Q8H   Continuous Infusions: . sodium chloride 125 mL/hr at 11/22/17 1353  . ferumoxytol Stopped (11/22/17 1242)     LOS: 8 days    Time spent: 35 minutes.     Elmarie Shiley, MD [facility] Pager [phone]  If 7PM-7AM, please contact night-coverage [email or web address] Password [facility] 11/22/2017, 3:44 PM

## 2017-11-23 LAB — CBC
HEMATOCRIT: 22.3 % — AB (ref 39.0–52.0)
Hemoglobin: 7.4 g/dL — ABNORMAL LOW (ref 13.0–17.0)
MCH: 25.3 pg — AB (ref 26.0–34.0)
MCHC: 33.2 g/dL (ref 30.0–36.0)
MCV: 76.1 fL — AB (ref 78.0–100.0)
PLATELETS: 280 10*3/uL (ref 150–400)
RBC: 2.93 MIL/uL — ABNORMAL LOW (ref 4.22–5.81)
RDW: 15.6 % — AB (ref 11.5–15.5)
WBC: 13.8 10*3/uL — ABNORMAL HIGH (ref 4.0–10.5)

## 2017-11-23 LAB — RENAL FUNCTION PANEL
Albumin: 2.6 g/dL — ABNORMAL LOW (ref 3.5–5.0)
Anion gap: 11 (ref 5–15)
BUN: 83 mg/dL — ABNORMAL HIGH (ref 6–20)
CHLORIDE: 101 mmol/L (ref 101–111)
CO2: 28 mmol/L (ref 22–32)
CREATININE: 7.62 mg/dL — AB (ref 0.61–1.24)
Calcium: 8.8 mg/dL — ABNORMAL LOW (ref 8.9–10.3)
GFR calc Af Amer: 8 mL/min — ABNORMAL LOW (ref >60)
GFR calc non Af Amer: 7 mL/min — ABNORMAL LOW (ref >60)
Glucose, Bld: 97 mg/dL (ref 65–99)
Phosphorus: 5.1 mg/dL — ABNORMAL HIGH (ref 2.5–4.6)
Potassium: 3.7 mmol/L (ref 3.5–5.1)
Sodium: 140 mmol/L (ref 135–145)

## 2017-11-23 NOTE — Progress Notes (Addendum)
[facility] Rounding Note  Background: 57 yo man, PMH Hep C (Harvoni), HTN, ED. Urinary sx for a month, treated with cipro. On lisinopril PTA for HTN.  Sent to ED by PCP 5/14 for abnl labs - creatinine 19, Hb 6.8. Imaging revealed bilateral hydro, had bilateral PCN's 11/15/17 w/slow improvement in renal function.  Not sure of recent baseline (in 2017 was 0.87). Has PSA >700. Metastatic prostate CA.   Assessment/Recommendations: 1. Renal failure. Not sure of most recent baseline (normal 2017). Creatinine 19 on admission, down to 7.6  and improving daily.  Making 4-5 liters/day of urine. IVF's changed to normal saline at 125 on 5/20. In large part just post obstructive diuresis, but some edema now, so reduce fluids to 75 and continue to monitor renal function. 2. Hypokalemia -repleted on 5/21 and stable since 3. Elevated PSA - 787. Highest I have ever seen. Has bladder thickening and LAN in the abdomen. Pt now aware of his Dx and prognosis (see Urology note) 4. Anemia - rec'd PRBC's 3U since admission. Hb 7.4. Dosed Aranesp 200 on 5/21, next due 5/28. TSat 8.  Feraheme X2 ordered, had 1st dose on 5/22.  Jamal Maes, MD [place] Pager 11/23/2017, 11:21 AM  Subjective:  No new issues Still making excellent urine, O>I from post obstructive diuresis Creatinine falling daily down from 20 to around 7.6  Vitals:   11/22/17 1335 11/22/17 2143 11/23/17 0500 11/23/17 0544  BP: 135/84 135/77  129/82  Pulse: 75 81  84  Resp: 20 20  20   Temp: 97.7 F (36.5 C) 100 F (37.8 C)  98.9 F (37.2 C)  TempSrc: Oral Oral  Oral  SpO2: 99% 99%  100%  Weight:   (!) 136.7 kg (301 lb 5.9 oz)   Height:       Exam: Large framed AAM VS as noted NAD No JVD Lung fields clear Abd soft, not tender Bilateral per tubes draining large amts urine 1+ pitting edema both LE's Alert and OX3  Inpatient medications: . allopurinol  100 mg Oral Daily  . darbepoetin  (ARANESP) injection - NON-DIALYSIS  200 mcg Subcutaneous Q Tue-1800  . heparin injection (subcutaneous)  5,000 Units Subcutaneous Q8H  . multivitamin with minerals  1 tablet Oral Daily  . pantoprazole  40 mg Oral BID  . sodium chloride flush  5 mL Intracatheter Q8H   . sodium chloride 125 mL/hr at 11/23/17 3149  . ferumoxytol Stopped (11/22/17 1242)   acetaminophen **OR** [DISCONTINUED] acetaminophen, calcium carbonate (dosed in mg elemental calcium), camphor-menthol **AND** hydrOXYzine, docusate sodium, feeding supplement (NEPRO CARB STEADY), hydrALAZINE, HYDROcodone-acetaminophen, metoprolol tartrate, ondansetron **OR** ondansetron (ZOFRAN) IV, sorbitol, zolpidem   Recent Labs  Lab 11/21/17 0507 11/22/17 0511 11/23/17 0455  NA 139 141 140  K 3.1* 3.6 3.7  CL 91* 98* 101  CO2 33* 29 28  GLUCOSE 140* 91 97  BUN 110* 95* 83*  CREATININE 10.04* 8.79* 7.62*  CALCIUM 7.4* 8.2* 8.8*  PHOS 5.5* 5.7* 5.1*   Recent Labs  Lab 11/21/17 0507 11/22/17 0511 11/23/17 0455  ALBUMIN 2.6* 2.7* 2.6*   Recent Labs  Lab 11/21/17 0507 11/22/17 0511 11/23/17 0455  WBC 19.1* 13.7* 13.8*  HGB 7.3* 7.4* 7.4*  HCT 21.8* 22.5* 22.3*  MCV 75.7* 76.3* 76.1*  PLT 275 290 280   Iron/TIBC/Ferritin/ %Sat    Component Value Date/Time   IRON 23 (L) 11/22/2017 0511   TIBC 272 11/22/2017 0511   IRONPCTSAT 8 (L) 11/22/2017 7026

## 2017-11-23 NOTE — Progress Notes (Signed)
Referring Physician(s): Manny,T  Supervising Physician: Aletta Edouard  Patient Status:  [facility] - In-pt  Chief Complaint: Bilateral hydronephrosis, renal failure     Subjective: Pt without new c/o; resting in chair   Allergies: Patient has no known allergies.  Medications: Prior to Admission medications   Medication Sig Start Date End Date Taking? Authorizing Provider  lisinopril-hydrochlorothiazide (PRINZIDE,ZESTORETIC) 20-25 MG tablet Take 1 tablet by mouth daily. 11/13/17  Yes [name], MD  Multiple Vitamins-Minerals (MULTIVITAMIN WITH MINERALS) tablet Take 1 tablet by mouth daily.     Yes [provider]  ciprofloxacin (CIPRO) 500 MG tablet Take 1 tablet (500 mg total) by mouth 2 (two) times daily. Patient not taking: Reported on 11/13/2017 10/11/17   [name], MD  Ledipasvir-Sofosbuvir (HARVONI) 90-400 MG TABS Take 1 tablet by mouth daily. Patient not taking: Reported on 09/25/2017 11/18/15   Thayer Headings, MD     Vital Signs: BP 129/82 (BP Location: Right Arm)   Pulse 84   Temp 98.9 F (37.2 C) (Oral)   Resp 20   Ht 6' (1.829 m)   Wt (!) 301 lb 5.9 oz (136.7 kg)   SpO2 100%   BMI 40.87 kg/m   Physical Exam awake/alert; bilat PCN's intact, outputs L- 150 CC; R- 300 CC today blood-tinged urine  Imaging: No results found.  Labs:  CBC: Recent Labs    11/20/17 0426 11/21/17 0507 11/22/17 0511 11/23/17 0455  WBC 19.5* 19.1* 13.7* 13.8*  HGB 7.5* 7.3* 7.4* 7.4*  HCT 22.1* 21.8* 22.5* 22.3*  PLT 262 275 290 280    COAGS: Recent Labs    11/14/17 1927  INR 1.25    BMP: Recent Labs    11/20/17 0426 11/21/17 0507 11/22/17 0511 11/23/17 0455  NA 141 139 141 140  K 3.5 3.1* 3.6 3.7  CL 89* 91* 98* 101  CO2 34* 33* 29 28  GLUCOSE 131* 140* 91 97  BUN 120* 110* 95* 83*  CALCIUM 8.1* 7.4* 8.2* 8.8*  CREATININE 12.36* 10.04* 8.79* 7.62*  GFRNONAA 4* 5* 6* 7*  GFRAA 5* 6* 7* 8*    LIVER FUNCTION TESTS: Recent Labs      11/13/17 0959  11/14/17 1927 11/15/17 0022 11/21/17 0507 11/22/17 0511 11/23/17 0455  BILITOT <0.2  --  0.7 0.4  --   --   --   AST 14  --  11* 11*  --   --   --   ALT 9  --  13* 11*  --   --   --   ALKPHOS 109  --  92 86  --   --   --   PROT 7.4  --  8.2* 7.5  --   --   --   ALBUMIN 3.9   < > 3.5 3.2* 2.6* 2.7* 2.6*   < > = values in this interval not displayed.    Assessment and Plan: Pt with hx bilat hydronephrosis/UVJ obst, , ARF, bladder wall thickening; s/p bil at [facility] 5/15; afebrile; WBC 13.8(13.7), HGB 7.4(7.4), creat 7.62(8.79); urine cx/cyt neg; cont current tx; plans as per urology     Electronically Signed: D. Rowe Robert, PA-C 11/23/2017, 10:48 AM   I spent a total of 15 minutes at the the patient's bedside AND on the patient's hospital floor or unit, greater than 50% of which was counseling/coordinating care for bilateral nephrostomies    Patient ID: Devon Cook, male   DOB: [birth year], 57 y.o.  MRN: [id number]

## 2017-11-23 NOTE — Progress Notes (Signed)
PROGRESS NOTE    Devon Cook  GMW:[id number] DOB: 09/30/1960 DOA: 11/14/2017 PCP: [name], MD    Brief Narrative: :57 year old male with history of hypertension and hep C admitted with malaise and difficulty voiding for 1-2 months, saw  PCP was treated with couple of rounds of antibiotics, had labs drawn which revealed a creatinine of 19 and was referred to the emergency room -CT revealed bilateral hydronephrosis at the level of the bladder with diffuse bladder wall thickening, bulky retroperitoneal lymphadenopathy -urology consulted, IR consulted, status post bilateral percutaneous nephrostomy tubes 5/15  -kidney function improving slowly, Renal and Urology following -plan for Outpatient workup for suspected metastatic prostate CA    Assessment & Plan:   Principal Problem:   ARF (acute renal failure) ([place]) Active Problems:   Hypertension   Morbid obesity with BMI of 40.0-44.9, adult ([place])   History of hepatitis C   GI bleed   Acute blood loss anemia   1-AKI;  -Postobstructive primarily, bilateral hydronephrosis, bladder CA suspected based on imaging -Lisinopril discontinue on admission.  -Urology consulted, Renal and IR consulted, creatinine peaked at 19 -status post bilateral nephrostomy tubes 5/15  -Cr continue to decreased. IV fluids decreased to 75 cc by nephrology    Bilateral severe hydronephrosis, bladder wall thickening and bulky retroperitoneal lymphadenopathy, status post bilateral nephrostomies as listed above Concerning with metastatic bladder ca and PSA elevated.  Urology recommends out patient cystoscopy and biopsy./  Patient aware of consideration and prognosis.   Anemia;  However patient also gives history of melena/black stools for few days prior to admission, was Hemoccult positive in the emergency room, GI consulted,  plan to defer GI workup at this time due to multitude of other active medical problems, no further melena Remain stable at  7.4.  On IV iron, aranesp.   Gouty Arthritis;  Improved after 2 doses of prednisone.   History of hepatitis C  status post Harvoni Rx  Leukocytosis; trending down.    DVT prophylaxis: heparin  Code Status: full code.  Family Communication: care discussed with patient.  Disposition Plan: home when renal function improved.    Consultants:  Nephrology  Urology  IR   Procedures: Bilateral PCNs 5/15    Antimicrobials: none  Subjective: No new complaints, denies abdominal pain   Objective: Vitals:   11/22/17 1335 11/22/17 2143 11/23/17 0500 11/23/17 0544  BP: 135/84 135/77  129/82  Pulse: 75 81  84  Resp: 20 20  20   Temp: 97.7 F (36.5 C) 100 F (37.8 C)  98.9 F (37.2 C)  TempSrc: Oral Oral  Oral  SpO2: 99% 99%  100%  Weight:   (!) 136.7 kg (301 lb 5.9 oz)   Height:        Intake/Output Summary (Last 24 hours) at 11/23/2017 1234 Last data filed at 11/23/2017 0900 Gross per 24 hour  Intake 3442.08 ml  Output 4250 ml  Net -807.92 ml   Filed Weights   11/21/17 0544 11/22/17 0500 11/23/17 0500  Weight: 134.6 kg (296 lb 11.8 oz) 135.4 kg (298 lb 8.1 oz) (!) 136.7 kg (301 lb 5.9 oz)    Examination:  General exam: NAD Respiratory system: CTA Cardiovascular system: S 1, S 2 RRR Gastrointestinal system: BS present, soft, nt GI; bilateral nephrostomy tube.  Central nervous system: non focal.  Extremities: Symmetric power.  Skin: no rashes     Data Reviewed: I have personally reviewed following labs and imaging studies  CBC: Recent Labs  Lab 11/19/17  1610 11/20/17 0426 11/21/17 0507 11/22/17 0511 11/23/17 0455  WBC 15.0* 19.5* 19.1* 13.7* 13.8*  HGB 7.3* 7.5* 7.3* 7.4* 7.4*  HCT 20.8* 22.1* 21.8* 22.5* 22.3*  MCV 73.2* 75.2* 75.7* 76.3* 76.1*  PLT 286 262 275 290 960   Basic Metabolic Panel: Recent Labs  Lab 11/19/17 0342 11/20/17 0426 11/21/17 0507 11/22/17 0511 11/23/17 0455  NA 135 141 139 141 140  K 3.7 3.5 3.1* 3.6 3.7  CL 91* 89*  91* 98* 101  CO2 26 34* 33* 29 28  GLUCOSE 178* 131* 140* 91 97  BUN 134* 120* 110* 95* 83*  CREATININE 13.91* 12.36* 10.04* 8.79* 7.62*  CALCIUM 7.9* 8.1* 7.4* 8.2* 8.8*  PHOS  --   --  5.5* 5.7* 5.1*   GFR: Estimated Creatinine Clearance: 15.5 mL/min (A) (by C-G formula based on SCr of 7.62 mg/dL (H)). Liver Function Tests: Recent Labs  Lab 11/21/17 0507 11/22/17 0511 11/23/17 0455  ALBUMIN 2.6* 2.7* 2.6*   No results for input(s): LIPASE, AMYLASE in the last 168 hours. No results for input(s): AMMONIA in the last 168 hours. Coagulation Profile: No results for input(s): INR, PROTIME in the last 168 hours. Cardiac Enzymes: No results for input(s): CKTOTAL, CKMB, CKMBINDEX, TROPONINI in the last 168 hours. BNP (last 3 results) No results for input(s): PROBNP in the last 8760 hours. HbA1C: No results for input(s): HGBA1C in the last 72 hours. CBG: No results for input(s): GLUCAP in the last 168 hours. Lipid Profile: No results for input(s): CHOL, HDL, LDLCALC, TRIG, CHOLHDL, LDLDIRECT in the last 72 hours. Thyroid Function Tests: No results for input(s): TSH, T4TOTAL, FREET4, T3FREE, THYROIDAB in the last 72 hours. Anemia Panel: Recent Labs    11/22/17 0511  TIBC 272  IRON 23*   Sepsis Labs: No results for input(s): PROCALCITON, LATICACIDVEN in the last 168 hours.  Recent Results (from the past 240 hour(s))  MRSA PCR Screening     Status: None   Collection Time: 11/15/17  9:15 PM  Result Value Ref Range Status   MRSA by PCR NEGATIVE NEGATIVE Final    Comment:        The GeneXpert MRSA Assay (FDA approved for NASAL specimens only), is one component of a comprehensive MRSA colonization surveillance program. It is not intended to diagnose MRSA infection nor to guide or monitor treatment for MRSA infections. Performed at [facility], [place] [address], [place], [place] [address]   Urine Culture     Status: None   Collection Time: 11/18/17 12:01 PM  Result  Value Ref Range Status   Specimen Description KIDNEY LEFT  Final   Special Requests NONE  Final   Culture   Final    NO GROWTH Performed at [facility], [address], [place], [place] [address]    Report Status 11/19/2017 FINAL  Final  Urine Culture     Status: None   Collection Time: 11/18/17 12:02 PM  Result Value Ref Range Status   Specimen Description KIDNEY RIGHT  Final   Special Requests NONE  Final   Culture   Final    NO GROWTH Performed at [facility], [place] [address] [place], [place] [address]    Report Status 11/19/2017 FINAL  Final         Radiology Studies: No results found.      Scheduled Meds: . allopurinol  100 mg Oral Daily  . darbepoetin (ARANESP) injection - NON-DIALYSIS  200 mcg Subcutaneous  Q Tue-1800  . heparin injection (subcutaneous)  5,000 Units Subcutaneous Q8H  . multivitamin with minerals  1 tablet Oral Daily  . pantoprazole  40 mg Oral BID  . sodium chloride flush  5 mL Intracatheter Q8H   Continuous Infusions: . sodium chloride 75 mL/hr at 11/23/17 1223  . ferumoxytol Stopped (11/22/17 1242)     LOS: 9 days    Time spent: 35 minutes.     Elmarie Shiley, MD [facility] Pager [phone]  If 7PM-7AM, please contact night-coverage [email or web address] Password [facility] 11/23/2017, 12:34 PM

## 2017-11-24 LAB — RENAL FUNCTION PANEL
Albumin: 2.7 g/dL — ABNORMAL LOW (ref 3.5–5.0)
Anion gap: 10 (ref 5–15)
BUN: 73 mg/dL — AB (ref 6–20)
CALCIUM: 9 mg/dL (ref 8.9–10.3)
CHLORIDE: 104 mmol/L (ref 101–111)
CO2: 26 mmol/L (ref 22–32)
CREATININE: 7.46 mg/dL — AB (ref 0.61–1.24)
GFR calc Af Amer: 8 mL/min — ABNORMAL LOW (ref >60)
GFR, EST NON AFRICAN AMERICAN: 7 mL/min — AB (ref >60)
Glucose, Bld: 102 mg/dL — ABNORMAL HIGH (ref 65–99)
Phosphorus: 4.2 mg/dL (ref 2.5–4.6)
Potassium: 4.1 mmol/L (ref 3.5–5.1)
SODIUM: 140 mmol/L (ref 135–145)

## 2017-11-24 LAB — CBC
HCT: 22.4 % — ABNORMAL LOW (ref 39.0–52.0)
HEMOGLOBIN: 7.3 g/dL — AB (ref 13.0–17.0)
MCH: 25 pg — ABNORMAL LOW (ref 26.0–34.0)
MCHC: 32.6 g/dL (ref 30.0–36.0)
MCV: 76.7 fL — ABNORMAL LOW (ref 78.0–100.0)
PLATELETS: 276 10*3/uL (ref 150–400)
RBC: 2.92 MIL/uL — AB (ref 4.22–5.81)
RDW: 15.9 % — ABNORMAL HIGH (ref 11.5–15.5)
WBC: 17.2 10*3/uL — AB (ref 4.0–10.5)

## 2017-11-24 MED ORDER — SENNOSIDES-DOCUSATE SODIUM 8.6-50 MG PO TABS
2.0000 | ORAL_TABLET | Freq: Once | ORAL | Status: AC
  Administered 2017-11-24: 2 via ORAL
  Filled 2017-11-24: qty 2

## 2017-11-24 NOTE — Progress Notes (Signed)
[facility] Rounding Note  Background: 57 yo man, PMH Hep C (Harvoni), HTN, ED. Urinary sx for a month, treated with cipro. On lisinopril PTA for HTN.  Sent to ED by PCP 5/14 for abnl labs - creatinine 19, Hb 6.8. Imaging revealed bilateral hydro, had bilateral PCN's 11/15/17 w/slow improvement in renal function.  Not sure of recent baseline (in 2017 was 0.87). PSA >700. Metastatic prostate CA.   Assessment/Recommendations: 1. Renal failure. Not sure of most recent baseline (normal 2017). Creatinine 19 on admission, down to 7.46 today not a lot better than yesterday. Intake and output better matched, I think leave fluids at 75/hour, continue to monitor renal function.    2. Hypokalemia -repleted on 5/21 and stable since 3. Elevated PSA - 787. Highest I have ever seen. Has bladder thickening and LAN in the abdomen. Pt now aware of his Dx and prognosis (see Urology note) 4. Anemia - rec'd PRBC's 3U since admission. Hb 7.4. Dosed Aranesp 200 on 5/21, next due 5/28. TSat 8%.  Feraheme X2 ordered, had 1st dose on 5/22. Recommend consideration of additional transfusions  Jamal Maes, MD [place] Pager 11/24/2017, 8:01 AM  Subjective:  No new issues Rate of fall of creatinine slowed past 24 hours Still good UOP, fluids now at 75/hour Really wants to go home but since still needs daily labs don't see that as possibility yet  Vitals:   11/23/17 1455 11/23/17 2309 11/24/17 0500 11/24/17 0615  BP: (!) 142/87 (!) 148/96  133/67  Pulse: 78 81  85  Resp: 20 20  20   Temp: 98.8 F (37.1 C) 98.9 F (37.2 C)  98.7 F (37.1 C)  TempSrc: Oral Oral  Oral  SpO2: 100% 98%  92%  Weight:   (!) 136.6 kg (301 lb 2.4 oz)   Height:       Exam: Large framed AAM VS as noted NAD No JVD Lung fields clear Abd soft, not tender Bilateral per tubes draining large amts urine Trace pitting edema both LE's Alert and OX3  Inpatient medications: . allopurinol  100 mg  Oral Daily  . darbepoetin (ARANESP) injection - NON-DIALYSIS  200 mcg Subcutaneous Q Tue-1800  . heparin injection (subcutaneous)  5,000 Units Subcutaneous Q8H  . multivitamin with minerals  1 tablet Oral Daily  . pantoprazole  40 mg Oral BID  . sodium chloride flush  5 mL Intracatheter Q8H   . sodium chloride 75 mL/hr at 11/23/17 1405  . ferumoxytol Stopped (11/22/17 1242)   acetaminophen **OR** [DISCONTINUED] acetaminophen, calcium carbonate (dosed in mg elemental calcium), camphor-menthol **AND** hydrOXYzine, docusate sodium, feeding supplement (NEPRO CARB STEADY), hydrALAZINE, HYDROcodone-acetaminophen, metoprolol tartrate, ondansetron **OR** ondansetron (ZOFRAN) IV, sorbitol, zolpidem   Recent Labs  Lab 11/22/17 0511 11/23/17 0455 11/24/17 0625  NA 141 140 140  K 3.6 3.7 4.1  CL 98* 101 104  CO2 29 28 26   GLUCOSE 91 97 102*  BUN 95* 83* 73*  CREATININE 8.79* 7.62* 7.46*  CALCIUM 8.2* 8.8* 9.0  PHOS 5.7* 5.1* 4.2   Recent Labs  Lab 11/22/17 0511 11/23/17 0455 11/24/17 0625  ALBUMIN 2.7* 2.6* 2.7*   Recent Labs  Lab 11/22/17 0511 11/23/17 0455 11/24/17 0625  WBC 13.7* 13.8* 17.2*  HGB 7.4* 7.4* 7.3*  HCT 22.5* 22.3* 22.4*  MCV 76.3* 76.1* 76.7*  PLT 290 280 276   Iron/TIBC/Ferritin/ %Sat    Component Value Date/Time   IRON 23 (L) 11/22/2017 0511   TIBC 272 11/22/2017 0511  IRONPCTSAT 8 (L) 11/22/2017 9030

## 2017-11-24 NOTE — Progress Notes (Signed)
PROGRESS NOTE    Devon Cook  RDE:[id number] DOB: [birth year] DOA: 11/14/2017 PCP: [name], MD    Brief Narrative: :57 year old male with history of hypertension and hep C admitted with malaise and difficulty voiding for 1-2 months, saw  PCP was treated with couple of rounds of antibiotics, had labs drawn which revealed a creatinine of 19 and was referred to the emergency room -CT revealed bilateral hydronephrosis at the level of the bladder with diffuse bladder wall thickening, bulky retroperitoneal lymphadenopathy -urology consulted, IR consulted, status post bilateral percutaneous nephrostomy tubes 5/15  -kidney function improving slowly, Renal and Urology following -plan for Outpatient workup for suspected metastatic prostate CA    Assessment & Plan:   Principal Problem:   ARF (acute renal failure) ([place]) Active Problems:   Hypertension   Morbid obesity with BMI of 40.0-44.9, adult ([place])   History of hepatitis C   GI bleed   Acute blood loss anemia   1-AKI;  -Postobstructive primarily, bilateral hydronephrosis, bladder CA suspected based on imaging -Lisinopril discontinue on admission.  -Urology consulted, Renal and IR consulted, creatinine peaked at 19 -status post bilateral nephrostomy tubes 5/15  -Cr continue to decreased. IV fluids decreased to 75 cc by nephrology  Cr today at 7.4.   Bilateral severe hydronephrosis, bladder wall thickening and bulky retroperitoneal lymphadenopathy, status post bilateral nephrostomies as listed above Concerning with metastatic bladder ca and PSA elevated.  Urology recommends out patient cystoscopy and biopsy./  Patient aware of consideration and prognosis.   Anemia;  However patient also gives history of melena/black stools for few days prior to admission, was Hemoccult positive in the emergency room, GI consulted,  plan to defer GI workup at this time due to multitude of other active medical problems, no further  melena Remain stable at 7.4. Might need blood transfusion.  On IV iron, aranesp.   Gouty Arthritis;  Improved after 2 doses of prednisone.   History of hepatitis C  status post Harvoni Rx  Leukocytosis; fluctuates. He denies cough, diarrhea, no rash. He is afebrile. Monitor for now.    DVT prophylaxis: heparin  Code Status: full code.  Family Communication: care discussed with patient.  Disposition Plan: home when renal function improved.    Consultants:  Nephrology  Urology  IR   Procedures: Bilateral PCNs 5/15    Antimicrobials: none  Subjective: hye is feeling well, denies cough, abdominal pain,. Diarrhea.   Objective: Vitals:   11/23/17 1455 11/23/17 2309 11/24/17 0500 11/24/17 0615  BP: (!) 142/87 (!) 148/96  133/67  Pulse: 78 81  85  Resp: 20 20  20   Temp: 98.8 F (37.1 C) 98.9 F (37.2 C)  98.7 F (37.1 C)  TempSrc: Oral Oral  Oral  SpO2: 100% 98%  92%  Weight:   (!) 136.6 kg (301 lb 2.4 oz)   Height:        Intake/Output Summary (Last 24 hours) at 11/24/2017 1305 Last data filed at 11/24/2017 1228 Gross per 24 hour  Intake 2521.67 ml  Output 4090 ml  Net -1568.33 ml   Filed Weights   11/22/17 0500 11/23/17 0500 11/24/17 0500  Weight: 135.4 kg (298 lb 8.1 oz) (!) 136.7 kg (301 lb 5.9 oz) (!) 136.6 kg (301 lb 2.4 oz)    Examination:  General exam: NAD Respiratory system: CTA Cardiovascular system:S 1, S 2 RRR Gastrointestinal system: BS present, soft, nt GI; bilateral nephrostomy tube.  Central nervous system: non focal.  Extremities: symmetric power.  Skin:  no rashes     Data Reviewed: I have personally reviewed following labs and imaging studies  CBC: Recent Labs  Lab 11/20/17 0426 11/21/17 0507 11/22/17 0511 11/23/17 0455 11/24/17 0625  WBC 19.5* 19.1* 13.7* 13.8* 17.2*  HGB 7.5* 7.3* 7.4* 7.4* 7.3*  HCT 22.1* 21.8* 22.5* 22.3* 22.4*  MCV 75.2* 75.7* 76.3* 76.1* 76.7*  PLT 262 275 290 280 710   Basic Metabolic  Panel: Recent Labs  Lab 11/20/17 0426 11/21/17 0507 11/22/17 0511 11/23/17 0455 11/24/17 0625  NA 141 139 141 140 140  K 3.5 3.1* 3.6 3.7 4.1  CL 89* 91* 98* 101 104  CO2 34* 33* 29 28 26   GLUCOSE 131* 140* 91 97 102*  BUN 120* 110* 95* 83* 73*  CREATININE 12.36* 10.04* 8.79* 7.62* 7.46*  CALCIUM 8.1* 7.4* 8.2* 8.8* 9.0  PHOS  --  5.5* 5.7* 5.1* 4.2   GFR: Estimated Creatinine Clearance: 15.8 mL/min (A) (by C-G formula based on SCr of 7.46 mg/dL (H)). Liver Function Tests: Recent Labs  Lab 11/21/17 0507 11/22/17 0511 11/23/17 0455 11/24/17 0625  ALBUMIN 2.6* 2.7* 2.6* 2.7*   No results for input(s): LIPASE, AMYLASE in the last 168 hours. No results for input(s): AMMONIA in the last 168 hours. Coagulation Profile: No results for input(s): INR, PROTIME in the last 168 hours. Cardiac Enzymes: No results for input(s): CKTOTAL, CKMB, CKMBINDEX, TROPONINI in the last 168 hours. BNP (last 3 results) No results for input(s): PROBNP in the last 8760 hours. HbA1C: No results for input(s): HGBA1C in the last 72 hours. CBG: No results for input(s): GLUCAP in the last 168 hours. Lipid Profile: No results for input(s): CHOL, HDL, LDLCALC, TRIG, CHOLHDL, LDLDIRECT in the last 72 hours. Thyroid Function Tests: No results for input(s): TSH, T4TOTAL, FREET4, T3FREE, THYROIDAB in the last 72 hours. Anemia Panel: Recent Labs    11/22/17 0511  TIBC 272  IRON 23*   Sepsis Labs: No results for input(s): PROCALCITON, LATICACIDVEN in the last 168 hours.  Recent Results (from the past 240 hour(s))  MRSA PCR Screening     Status: None   Collection Time: 11/15/17  9:15 PM  Result Value Ref Range Status   MRSA by PCR NEGATIVE NEGATIVE Final    Comment:        The GeneXpert MRSA Assay (FDA approved for NASAL specimens only), is one component of a comprehensive MRSA colonization surveillance program. It is not intended to diagnose MRSA infection nor to guide or monitor treatment  for MRSA infections. Performed at [facility], [place] [address] [place], [place] [address]   Urine Culture     Status: None   Collection Time: 11/18/17 12:01 PM  Result Value Ref Range Status   Specimen Description KIDNEY LEFT  Final   Special Requests NONE  Final   Culture   Final    NO GROWTH Performed at [facility], [address], [place], [place] [address]    Report Status 11/19/2017 FINAL  Final  Urine Culture     Status: None   Collection Time: 11/18/17 12:02 PM  Result Value Ref Range Status   Specimen Description KIDNEY RIGHT  Final   Special Requests NONE  Final   Culture   Final    NO GROWTH Performed at [facility], [place] [address], [place], [place] [address]    Report Status 11/19/2017 FINAL  Final         Radiology Studies: No results found.  Scheduled Meds: . allopurinol  100 mg Oral Daily  . darbepoetin (ARANESP) injection - NON-DIALYSIS  200 mcg Subcutaneous Q Tue-1800  . heparin injection (subcutaneous)  5,000 Units Subcutaneous Q8H  . multivitamin with minerals  1 tablet Oral Daily  . pantoprazole  40 mg Oral BID  . sodium chloride flush  5 mL Intracatheter Q8H   Continuous Infusions: . sodium chloride 75 mL/hr at 11/23/17 1405  . ferumoxytol Stopped (11/22/17 1242)     LOS: 10 days    Time spent: 35 minutes.     Elmarie Shiley, MD [facility] Pager [phone]  If 7PM-7AM, please contact night-coverage [email or web address] Password [email or web address] 11/24/2017, 1:05 PM

## 2017-11-25 ENCOUNTER — Inpatient Hospital Stay (HOSPITAL_COMMUNITY): Payer: 59

## 2017-11-25 DIAGNOSIS — R509 Fever, unspecified: Secondary | ICD-10-CM

## 2017-11-25 LAB — URINALYSIS, ROUTINE W REFLEX MICROSCOPIC
BILIRUBIN URINE: NEGATIVE
Bacteria, UA: NONE SEEN
GLUCOSE, UA: NEGATIVE mg/dL
KETONES UR: NEGATIVE mg/dL
NITRITE: NEGATIVE
PH: 7 (ref 5.0–8.0)
PROTEIN: NEGATIVE mg/dL
Specific Gravity, Urine: 1.005 (ref 1.005–1.030)

## 2017-11-25 LAB — CBC
HEMATOCRIT: 21.7 % — AB (ref 39.0–52.0)
Hemoglobin: 7.1 g/dL — ABNORMAL LOW (ref 13.0–17.0)
MCH: 25.4 pg — ABNORMAL LOW (ref 26.0–34.0)
MCHC: 32.7 g/dL (ref 30.0–36.0)
MCV: 77.8 fL — ABNORMAL LOW (ref 78.0–100.0)
Platelets: 272 10*3/uL (ref 150–400)
RBC: 2.79 MIL/uL — AB (ref 4.22–5.81)
RDW: 16.1 % — ABNORMAL HIGH (ref 11.5–15.5)
WBC: 20.1 10*3/uL — AB (ref 4.0–10.5)

## 2017-11-25 LAB — RENAL FUNCTION PANEL
ALBUMIN: 2.5 g/dL — AB (ref 3.5–5.0)
ANION GAP: 11 (ref 5–15)
BUN: 69 mg/dL — ABNORMAL HIGH (ref 6–20)
CO2: 25 mmol/L (ref 22–32)
Calcium: 8.9 mg/dL (ref 8.9–10.3)
Chloride: 103 mmol/L (ref 101–111)
Creatinine, Ser: 7.48 mg/dL — ABNORMAL HIGH (ref 0.61–1.24)
GFR, EST AFRICAN AMERICAN: 8 mL/min — AB (ref >60)
GFR, EST NON AFRICAN AMERICAN: 7 mL/min — AB (ref >60)
Glucose, Bld: 122 mg/dL — ABNORMAL HIGH (ref 65–99)
PHOSPHORUS: 3.9 mg/dL (ref 2.5–4.6)
POTASSIUM: 3.7 mmol/L (ref 3.5–5.1)
Sodium: 139 mmol/L (ref 135–145)

## 2017-11-25 MED ORDER — SODIUM CHLORIDE 0.9 % IV SOLN
1.0000 g | INTRAVENOUS | Status: DC
  Administered 2017-11-25 – 2017-11-29 (×5): 1 g via INTRAVENOUS
  Filled 2017-11-25 (×5): qty 1

## 2017-11-25 NOTE — Progress Notes (Signed)
ANTIBIOTIC CONSULT NOTE - INITIAL  Pharmacy Consult for Cefepime Indication: sepsis  No Known Allergies  Patient Measurements: Height: 6' (182.9 cm) Weight: (!) 301 lb 13 oz (136.9 kg) IBW/kg (Calculated) : 77.6   Vital Signs: Temp: 101.7 F (38.7 C) (05/25 0434) Temp Source: Oral (05/25 0434) BP: 138/87 (05/25 0434) Pulse Rate: 89 (05/25 0434) Intake/Output from previous day: 05/24 0701 - 05/25 0700 In: 2170 [P.O.:660; I.V.:1500] Out: 3765 [Urine:3765] Intake/Output from this shift: No intake/output data recorded.  Labs: Recent Labs    11/23/17 0455 11/24/17 0625 11/25/17 0404  WBC 13.8* 17.2* 20.1*  HGB 7.4* 7.3* 7.1*  PLT 280 276 272  CREATININE 7.62* 7.46* 7.48*   Estimated Creatinine Clearance: 15.8 mL/min (A) (by C-G formula based on SCr of 7.48 mg/dL (H)). No results for input(s): VANCOTROUGH, VANCOPEAK, VANCORANDOM, GENTTROUGH, GENTPEAK, GENTRANDOM, TOBRATROUGH, TOBRAPEAK, TOBRARND, AMIKACINPEAK, AMIKACINTROU, AMIKACIN in the last 72 hours.   Microbiology: Recent Results (from the past 720 hour(s))  MRSA PCR Screening     Status: None   Collection Time: 11/15/17  9:15 PM  Result Value Ref Range Status   MRSA by PCR NEGATIVE NEGATIVE Final    Comment:        The GeneXpert MRSA Assay (FDA approved for NASAL specimens only), is one component of a comprehensive MRSA colonization surveillance program. It is not intended to diagnose MRSA infection nor to guide or monitor treatment for MRSA infections. Performed at [facility], [place] [address], [place], [place] [address]   Urine Culture     Status: None   Collection Time: 11/18/17 12:01 PM  Result Value Ref Range Status   Specimen Description KIDNEY LEFT  Final   Special Requests NONE  Final   Culture   Final    NO GROWTH Performed at [facility], [address], [place], [place] [address]    Report Status 11/19/2017 FINAL  Final  Urine Culture     Status: None   Collection Time:  11/18/17 12:02 PM  Result Value Ref Range Status   Specimen Description KIDNEY RIGHT  Final   Special Requests NONE  Final   Culture   Final    NO GROWTH Performed at [facility], [place] [address], [place], [place] [address]    Report Status 11/19/2017 FINAL  Final    Medical History: Past Medical History:  Diagnosis Date  . Allergy   . ED (erectile dysfunction)   . Herpes genitalis in men   . Hypertension 10/2004  . Impaired fasting glucose 10/2004  . Obesity    Assessment: 57 yo M w/ PMH prostatitis last year w/ hep C in remission, who currently has bilateral hydronephrosis. Pharmacy consulted to dose cefepime for possible sepsis. WBC elevated, febrile, renal function poor, but still making urine, not on HD currently.   Plan:  Cefepime 1g IV q24h  Monitor clinic progress, WBC, TMax, renal function, electrolytes F/u BCx, C/S, future de-escalation, & length of therapy  Nida Boatman, PharmD PGY1 Acute Care Pharmacy Resident Pager: [phone] 11/25/2017,8:17 AM

## 2017-11-25 NOTE — Progress Notes (Signed)
PROGRESS NOTE    Devon Cook  AVW:[id number] DOB: [birth year] DOA: 11/14/2017 PCP: [name], MD    Brief Narrative: :56 year old male with history of hypertension and hep C admitted with malaise and difficulty voiding for 1-2 months, saw  PCP was treated with couple of rounds of antibiotics, had labs drawn which revealed a creatinine of 19 and was referred to the emergency room -CT revealed bilateral hydronephrosis at the level of the bladder with diffuse bladder wall thickening, bulky retroperitoneal lymphadenopathy -urology consulted, IR consulted, status post bilateral percutaneous nephrostomy tubes 5/15  -kidney function improving slowly, Renal and Urology following -plan for Outpatient workup for suspected metastatic prostate CA   Assessment & Plan:   Principal Problem:   ARF (acute renal failure) ([place]) Active Problems:   Hypertension   Morbid obesity with BMI of 40.0-44.9, adult ([place])   History of hepatitis C   GI bleed   Acute blood loss anemia   1-AKI;  -Postobstructive primarily, bilateral hydronephrosis, bladder CA suspected based on imaging -Lisinopril discontinue on admission.  -Urology consulted, Renal and IR consulted, creatinine peaked at 19 -status post bilateral nephrostomy tubes 5/15  -Cr continue to decreased. IV fluids decreased to 75 cc by nephrology  -Cr today at 7.8. Out put 3.7  Fever, Leukocytosis;  He denies cough, diarrhea.  Will pan culture; Blood culture, UA, urine culture, chest x ray.  Will start cefepime empirically.   Bilateral severe hydronephrosis, bladder wall thickening and bulky retroperitoneal lymphadenopathy, status post bilateral nephrostomies as listed above Concerning with metastatic bladder ca and PSA elevated.  Urology recommends out patient cystoscopy and biopsy./  Patient aware of consideration and prognosis.   Anemia;  However patient also gives history of melena/black stools for few days prior to admission, was  Hemoccult positive in the emergency room, GI consulted,  plan to defer GI workup at this time due to multitude of other active medical problems, no further melena Remain stable at 7.4. Might need blood transfusion.  Received  IV iron, aranesp.   Gouty Arthritis;  Improved after 2 doses of prednisone.   History of hepatitis C  status post Harvoni Rx   DVT prophylaxis: heparin  Code Status: full code.  Family Communication: care discussed with patient.  Disposition Plan: home when renal function improved.    Consultants:  Nephrology  Urology  IR   Procedures: Bilateral PCNs 5/15    Antimicrobials: none  Subjective: He denies diarrhea, cough   Objective: Vitals:   11/24/17 2207 11/24/17 2330 11/25/17 0230 11/25/17 0434  BP: (!) 153/90   138/87  Pulse: 91   89  Resp: 18   18  Temp: (!) 102.8 F (39.3 C) 100.3 F (37.9 C) 99.3 F (37.4 C) (!) 101.7 F (38.7 C)  TempSrc: Oral Oral Oral Oral  SpO2: 98%   100%  Weight:    (!) 136.9 kg (301 lb 13 oz)  Height:        Intake/Output Summary (Last 24 hours) at 11/25/2017 1537 Last data filed at 11/25/2017 1231 Gross per 24 hour  Intake 1940 ml  Output 2700 ml  Net -760 ml   Filed Weights   11/23/17 0500 11/24/17 0500 11/25/17 0434  Weight: (!) 136.7 kg (301 lb 5.9 oz) (!) 136.6 kg (301 lb 2.4 oz) (!) 136.9 kg (301 lb 13 oz)    Examination:  General exam: NAD Respiratory system: CTA Cardiovascular system: S 1, S 2 RRR Gastrointestinal system: BS present, soft, nt GI; bilateral nephrostomy  tube.  Central nervous system: Non focal.  Extremities: Symmetric power.  Skin: no rashes     Data Reviewed: I have personally reviewed following labs and imaging studies  CBC: Recent Labs  Lab 11/21/17 0507 11/22/17 0511 11/23/17 0455 11/24/17 0625 11/25/17 0404  WBC 19.1* 13.7* 13.8* 17.2* 20.1*  HGB 7.3* 7.4* 7.4* 7.3* 7.1*  HCT 21.8* 22.5* 22.3* 22.4* 21.7*  MCV 75.7* 76.3* 76.1* 76.7* 77.8*  PLT 275 290  280 276 825   Basic Metabolic Panel: Recent Labs  Lab 11/21/17 0507 11/22/17 0511 11/23/17 0455 11/24/17 0625 11/25/17 0404  NA 139 141 140 140 139  K 3.1* 3.6 3.7 4.1 3.7  CL 91* 98* 101 104 103  CO2 33* 29 28 26 25   GLUCOSE 140* 91 97 102* 122*  BUN 110* 95* 83* 73* 69*  CREATININE 10.04* 8.79* 7.62* 7.46* 7.48*  CALCIUM 7.4* 8.2* 8.8* 9.0 8.9  PHOS 5.5* 5.7* 5.1* 4.2 3.9   GFR: Estimated Creatinine Clearance: 15.8 mL/min (A) (by C-G formula based on SCr of 7.48 mg/dL (H)). Liver Function Tests: Recent Labs  Lab 11/21/17 0507 11/22/17 0511 11/23/17 0455 11/24/17 0625 11/25/17 0404  ALBUMIN 2.6* 2.7* 2.6* 2.7* 2.5*   No results for input(s): LIPASE, AMYLASE in the last 168 hours. No results for input(s): AMMONIA in the last 168 hours. Coagulation Profile: No results for input(s): INR, PROTIME in the last 168 hours. Cardiac Enzymes: No results for input(s): CKTOTAL, CKMB, CKMBINDEX, TROPONINI in the last 168 hours. BNP (last 3 results) No results for input(s): PROBNP in the last 8760 hours. HbA1C: No results for input(s): HGBA1C in the last 72 hours. CBG: No results for input(s): GLUCAP in the last 168 hours. Lipid Profile: No results for input(s): CHOL, HDL, LDLCALC, TRIG, CHOLHDL, LDLDIRECT in the last 72 hours. Thyroid Function Tests: No results for input(s): TSH, T4TOTAL, FREET4, T3FREE, THYROIDAB in the last 72 hours. Anemia Panel: No results for input(s): VITAMINB12, FOLATE, FERRITIN, TIBC, IRON, RETICCTPCT in the last 72 hours. Sepsis Labs: No results for input(s): PROCALCITON, LATICACIDVEN in the last 168 hours.  Recent Results (from the past 240 hour(s))  MRSA PCR Screening     Status: None   Collection Time: 11/15/17  9:15 PM  Result Value Ref Range Status   MRSA by PCR NEGATIVE NEGATIVE Final    Comment:        The GeneXpert MRSA Assay (FDA approved for NASAL specimens only), is one component of a comprehensive MRSA colonization surveillance  program. It is not intended to diagnose MRSA infection nor to guide or monitor treatment for MRSA infections. Performed at [facility], [place] [address] [place], [place] [address]   Urine Culture     Status: None   Collection Time: 11/18/17 12:01 PM  Result Value Ref Range Status   Specimen Description KIDNEY LEFT  Final   Special Requests NONE  Final   Culture   Final    NO GROWTH Performed at [facility], [address] [place], [place] [address]    Report Status 11/19/2017 FINAL  Final  Urine Culture     Status: None   Collection Time: 11/18/17 12:02 PM  Result Value Ref Range Status   Specimen Description KIDNEY RIGHT  Final   Special Requests NONE  Final   Culture   Final    NO GROWTH Performed at [facility], [place] [address], [place], [place] [address]    Report Status 11/19/2017 FINAL  Final         Radiology Studies: Dg Chest 2 View  Result Date: 11/25/2017 CLINICAL DATA:  Fever EXAM: CHEST - 2 VIEW COMPARISON:  03/13/2006 FINDINGS: Normal heart size. Lungs under aerated and clear. No pneumothorax or pleural effusion. IMPRESSION: No active cardiopulmonary disease. Electronically Signed   By: Marybelle Killings M.D.   On: 11/25/2017 11:51        Scheduled Meds: . allopurinol  100 mg Oral Daily  . darbepoetin (ARANESP) injection - NON-DIALYSIS  200 mcg Subcutaneous Q Tue-1800  . heparin injection (subcutaneous)  5,000 Units Subcutaneous Q8H  . multivitamin with minerals  1 tablet Oral Daily  . pantoprazole  40 mg Oral BID  . sodium chloride flush  5 mL Intracatheter Q8H   Continuous Infusions: . sodium chloride 75 mL/hr at 11/24/17 1703  . ceFEPime (MAXIPIME) IV Stopped (11/25/17 0940)     LOS: 11 days    Time spent: 35 minutes.     Elmarie Shiley, MD [facility] Pager [phone]  If 7PM-7AM, please contact night-coverage [email or web address] Password [facility] 11/25/2017, 3:37 PM

## 2017-11-25 NOTE — Progress Notes (Signed)
[facility] Rounding Note  Background: 57 yo man, PMH Hep C (Harvoni), HTN, ED. Urinary sx for a month, treated with cipro. On lisinopril PTA for HTN.  Sent to ED by PCP 5/14 for abnl labs - creatinine 19, Hb 6.8. Imaging revealed bilateral hydro, had bilateral PCN's 11/15/17 w/slow improvement in renal function.  Not sure of recent baseline (in 2017 was 0.87). PSA >700. Metastatic prostate CA.   Assessment/Recommendations: 1. Renal failure. Not sure of most recent baseline (normal 2017). Creatinine 19 on admission, down to 7.46 and stalled at that level.  UOP still good,  fluids at 75/hour, continue to monitor renal function. I hope we see more improvement.  2. Hypokalemia -repleted on 5/21 and stable since 3. Elevated PSA - 787. Highest I have ever seen. Has bladder thickening and LAN in the abdomen. Pt now aware of his Dx and prognosis (see Urology note) 4. Anemia - rec'd PRBC's 3U since admission. Hb 7.4. Dosed Aranesp 200 on 5/21, next due 5/28. TSat 8%.  Feraheme X2 ordered, had 1st dose on 5/22. Recommend consideration of additional transfusions 5. Fever temp to 102.8. WBC to 20K. Blood and urine cultures done. Will not redose feraheme in face of fever.  Jamal Maes, MD [facility] [phone] Pager 11/25/2017, 1:26 PM  Subjective:  Creatinine stalled out at around 7.5 New fevers On maxipine Cultures pending, CXR neg  Vitals:   11/24/17 2207 11/24/17 2330 11/25/17 0230 11/25/17 0434  BP: (!) 153/90   138/87  Pulse: 91   89  Resp: 18   18  Temp: (!) 102.8 F (39.3 C) 100.3 F (37.9 C) 99.3 F (37.4 C) (!) 101.7 F (38.7 C)  TempSrc: Oral Oral Oral Oral  SpO2: 98%   100%  Weight:    (!) 136.9 kg (301 lb 13 oz)  Height:       Exam: Large framed AAM VS as noted NAD No JVD Lung fields clear Abd soft, not tender Bilateral per tubes draining large amts urine Trace pitting edema both LE's Alert and OX3  Inpatient medications: . allopurinol   100 mg Oral Daily  . darbepoetin (ARANESP) injection - NON-DIALYSIS  200 mcg Subcutaneous Q Tue-1800  . heparin injection (subcutaneous)  5,000 Units Subcutaneous Q8H  . multivitamin with minerals  1 tablet Oral Daily  . pantoprazole  40 mg Oral BID  . sodium chloride flush  5 mL Intracatheter Q8H   . sodium chloride 75 mL/hr at 11/24/17 1703  . ceFEPime (MAXIPIME) IV Stopped (11/25/17 0940)  . ferumoxytol Stopped (11/22/17 1242)   acetaminophen **OR** [DISCONTINUED] acetaminophen, calcium carbonate (dosed in mg elemental calcium), camphor-menthol **AND** hydrOXYzine, docusate sodium, feeding supplement (NEPRO CARB STEADY), hydrALAZINE, HYDROcodone-acetaminophen, metoprolol tartrate, ondansetron **OR** ondansetron (ZOFRAN) IV, sorbitol, zolpidem   Recent Labs  Lab 11/23/17 0455 11/24/17 0625 11/25/17 0404  NA 140 140 139  K 3.7 4.1 3.7  CL 101 104 103  CO2 28 26 25   GLUCOSE 97 102* 122*  BUN 83* 73* 69*  CREATININE 7.62* 7.46* 7.48*  CALCIUM 8.8* 9.0 8.9  PHOS 5.1* 4.2 3.9   Recent Labs  Lab 11/23/17 0455 11/24/17 0625 11/25/17 0404  ALBUMIN 2.6* 2.7* 2.5*   Recent Labs  Lab 11/23/17 0455 11/24/17 0625 11/25/17 0404  WBC 13.8* 17.2* 20.1*  HGB 7.4* 7.3* 7.1*  HCT 22.3* 22.4* 21.7*  MCV 76.1* 76.7* 77.8*  PLT 280 276 272   Iron/TIBC/Ferritin/ %Sat    Component Value Date/Time   IRON 23 (L)  11/22/2017 0511   TIBC 272 11/22/2017 0511   IRONPCTSAT 8 (L) 11/22/2017 5072

## 2017-11-26 LAB — RENAL FUNCTION PANEL
Albumin: 2.5 g/dL — ABNORMAL LOW (ref 3.5–5.0)
Anion gap: 10 (ref 5–15)
BUN: 62 mg/dL — ABNORMAL HIGH (ref 6–20)
CALCIUM: 8.5 mg/dL — AB (ref 8.9–10.3)
CHLORIDE: 103 mmol/L (ref 101–111)
CO2: 22 mmol/L (ref 22–32)
Creatinine, Ser: 7.25 mg/dL — ABNORMAL HIGH (ref 0.61–1.24)
GFR, EST AFRICAN AMERICAN: 9 mL/min — AB (ref >60)
GFR, EST NON AFRICAN AMERICAN: 7 mL/min — AB (ref >60)
Glucose, Bld: 130 mg/dL — ABNORMAL HIGH (ref 65–99)
PHOSPHORUS: 4 mg/dL (ref 2.5–4.6)
Potassium: 3.7 mmol/L (ref 3.5–5.1)
SODIUM: 135 mmol/L (ref 135–145)

## 2017-11-26 LAB — URINE CULTURE: Culture: <10000 — AB

## 2017-11-26 LAB — CBC
HCT: 21.5 % — ABNORMAL LOW (ref 39.0–52.0)
Hemoglobin: 7 g/dL — ABNORMAL LOW (ref 13.0–17.0)
MCH: 25.3 pg — AB (ref 26.0–34.0)
MCHC: 32.6 g/dL (ref 30.0–36.0)
MCV: 77.6 fL — ABNORMAL LOW (ref 78.0–100.0)
Platelets: 272 10*3/uL (ref 150–400)
RBC: 2.77 MIL/uL — AB (ref 4.22–5.81)
RDW: 16.4 % — ABNORMAL HIGH (ref 11.5–15.5)
WBC: 17.9 10*3/uL — ABNORMAL HIGH (ref 4.0–10.5)

## 2017-11-26 LAB — PREPARE RBC (CROSSMATCH)

## 2017-11-26 MED ORDER — SODIUM CHLORIDE 0.9 % IV SOLN
Freq: Once | INTRAVENOUS | Status: AC
  Administered 2017-11-26: 15:00:00 via INTRAVENOUS

## 2017-11-26 NOTE — Progress Notes (Signed)
PROGRESS NOTE    Devon Cook  MLY:[id number] DOB: Nov 03, 1960 DOA: 11/14/2017 PCP: [name], MD    Brief Narrative: :57 year old male with history of hypertension and hep C admitted with malaise and difficulty voiding for 1-2 months, saw  PCP was treated with couple of rounds of antibiotics, had labs drawn which revealed a creatinine of 19 and was referred to the emergency room -CT revealed bilateral hydronephrosis at the level of the bladder with diffuse bladder wall thickening, bulky retroperitoneal lymphadenopathy -urology consulted, IR consulted, status post bilateral percutaneous nephrostomy tubes 5/15  -kidney function improving slowly, Renal and Urology following -plan for Outpatient workup for suspected metastatic prostate CA   Assessment & Plan:   Principal Problem:   ARF (acute renal failure) ([place]) Active Problems:   Hypertension   Morbid obesity with BMI of 40.0-44.9, adult ([place])   History of hepatitis C   GI bleed   Acute blood loss anemia   1-AKI;  -Postobstructive primarily, bilateral hydronephrosis, bladder CA suspected based on imaging -Lisinopril discontinue on admission.  -Urology consulted, Renal and IR consulted, creatinine peaked at 19 -status post bilateral nephrostomy tubes 5/15  -Back on 125 cc IV fluids.  -Cr today at 7.8. Out put 4.2  Fever, Leukocytosis;  He denies cough, diarrhea.  pan culture; Blood culture, UA, urine culture 10 K , chest x ray negative   Continue with cefepime empirically.  WBC trending down/   Bilateral severe hydronephrosis, bladder wall thickening and bulky retroperitoneal lymphadenopathy, status post bilateral nephrostomies as listed above Concerning with metastatic bladder ca and PSA elevated.  Urology recommends out patient cystoscopy and biopsy./  Patient aware of consideration and prognosis.    Anemia;  However patient also gives history of melena/black stools for few days prior to admission, was  Hemoccult positive in the emergency room, GI consulted,  plan to defer GI workup at this time due to multitude of other active medical problems, no further melena Hb at 7 Received  IV iron, aranesp.  Will transfuse one unit PRBC.   Gouty Arthritis;  Improved after 2 doses of prednisone.   History of hepatitis C  status post Harvoni Rx   DVT prophylaxis: heparin  Code Status: full code.  Family Communication: care discussed with patient.  Disposition Plan: home when renal function improved.    Consultants:  Nephrology  Urology  IR   Procedures: Bilateral PCNs 5/15    Antimicrobials: none  Subjective: He is feeling ok, denies dyspnea, chest pain, no abdominal pain    Objective: Vitals:   11/25/17 1739 11/25/17 2100 11/26/17 0114 11/26/17 0500  BP: (!) 145/76 (!) 144/86  (!) 147/93  Pulse: 78 87  92  Resp: 18 18  18   Temp: 100 F (37.8 C) 100 F (37.8 C) 98.8 F (37.1 C) 99.2 F (37.3 C)  TempSrc:  Oral Oral Oral  SpO2: 100% 100%  100%  Weight:      Height:        Intake/Output Summary (Last 24 hours) at 11/26/2017 1450 Last data filed at 11/26/2017 0600 Gross per 24 hour  Intake 1140 ml  Output 3575 ml  Net -2435 ml   Filed Weights   11/23/17 0500 11/24/17 0500 11/25/17 0434  Weight: (!) 136.7 kg (301 lb 5.9 oz) (!) 136.6 kg (301 lb 2.4 oz) (!) 136.9 kg (301 lb 13 oz)    Examination:  General exam: NAD Respiratory system: CTA Cardiovascular system: S 1, S 2 RRR Gastrointestinal system: BS present,  soft, nt GI; bilateral nephrostomy tube.  Central nervous system: Non focal.  Extremities: Symmetric power.  Skin: no rashes     Data Reviewed: I have personally reviewed following labs and imaging studies  CBC: Recent Labs  Lab 11/22/17 0511 11/23/17 0455 11/24/17 0625 11/25/17 0404 11/26/17 0319  WBC 13.7* 13.8* 17.2* 20.1* 17.9*  HGB 7.4* 7.4* 7.3* 7.1* 7.0*  HCT 22.5* 22.3* 22.4* 21.7* 21.5*  MCV 76.3* 76.1* 76.7* 77.8* 77.6*  PLT 290  280 276 272 462   Basic Metabolic Panel: Recent Labs  Lab 11/22/17 0511 11/23/17 0455 11/24/17 0625 11/25/17 0404 11/26/17 0319  NA 141 140 140 139 135  K 3.6 3.7 4.1 3.7 3.7  CL 98* 101 104 103 103  CO2 29 28 26 25 22   GLUCOSE 91 97 102* 122* 130*  BUN 95* 83* 73* 69* 62*  CREATININE 8.79* 7.62* 7.46* 7.48* 7.25*  CALCIUM 8.2* 8.8* 9.0 8.9 8.5*  PHOS 5.7* 5.1* 4.2 3.9 4.0   GFR: Estimated Creatinine Clearance: 16.3 mL/min (A) (by C-G formula based on SCr of 7.25 mg/dL (H)). Liver Function Tests: Recent Labs  Lab 11/22/17 0511 11/23/17 0455 11/24/17 0625 11/25/17 0404 11/26/17 0319  ALBUMIN 2.7* 2.6* 2.7* 2.5* 2.5*   No results for input(s): LIPASE, AMYLASE in the last 168 hours. No results for input(s): AMMONIA in the last 168 hours. Coagulation Profile: No results for input(s): INR, PROTIME in the last 168 hours. Cardiac Enzymes: No results for input(s): CKTOTAL, CKMB, CKMBINDEX, TROPONINI in the last 168 hours. BNP (last 3 results) No results for input(s): PROBNP in the last 8760 hours. HbA1C: No results for input(s): HGBA1C in the last 72 hours. CBG: No results for input(s): GLUCAP in the last 168 hours. Lipid Profile: No results for input(s): CHOL, HDL, LDLCALC, TRIG, CHOLHDL, LDLDIRECT in the last 72 hours. Thyroid Function Tests: No results for input(s): TSH, T4TOTAL, FREET4, T3FREE, THYROIDAB in the last 72 hours. Anemia Panel: No results for input(s): VITAMINB12, FOLATE, FERRITIN, TIBC, IRON, RETICCTPCT in the last 72 hours. Sepsis Labs: No results for input(s): PROCALCITON, LATICACIDVEN in the last 168 hours.  Recent Results (from the past 240 hour(s))  Urine Culture     Status: None   Collection Time: 11/18/17 12:01 PM  Result Value Ref Range Status   Specimen Description KIDNEY LEFT  Final   Special Requests NONE  Final   Culture   Final    NO GROWTH Performed at [facility], [address] [place], [place] [address]    Report Status  11/19/2017 FINAL  Final  Urine Culture     Status: None   Collection Time: 11/18/17 12:02 PM  Result Value Ref Range Status   Specimen Description KIDNEY RIGHT  Final   Special Requests NONE  Final   Culture   Final    NO GROWTH Performed at [facility], [place] [address], [place], [place] [address]    Report Status 11/19/2017 FINAL  Final  Urine Culture     Status: Abnormal   Collection Time: 11/25/17  7:55 AM  Result Value Ref Range Status   Specimen Description URINE, CLEAN CATCH  Final   Special Requests NONE  Final   Culture (A)  Final    <10,000 COLONIES/mL INSIGNIFICANT GROWTH Performed at [facility], [place] [address] [place],  [address]    Report Status 11/26/2017 FINAL  Final  Culture, blood (routine x 2)     Status: None (Preliminary result)  Collection Time: 11/25/17  9:02 AM  Result Value Ref Range Status   Specimen Description BLOOD LEFT HAND  Final   Special Requests   Final    BOTTLES DRAWN AEROBIC AND ANAEROBIC Blood Culture adequate volume   Culture   Final    NO GROWTH 1 DAY Performed at [facility], [address] [place], [place] [address]    Report Status PENDING  Incomplete  Culture, blood (routine x 2)     Status: None (Preliminary result)   Collection Time: 11/25/17  9:02 AM  Result Value Ref Range Status   Specimen Description BLOOD LEFT HAND  Final   Special Requests   Final    BOTTLES DRAWN AEROBIC AND ANAEROBIC Blood Culture adequate volume   Culture   Final    NO GROWTH 1 DAY Performed at [facility], [place] [address] [place], [place] [address]    Report Status PENDING  Incomplete         Radiology Studies: Dg Chest 2 View  Result Date: 11/25/2017 CLINICAL DATA:  Fever EXAM: CHEST - 2 VIEW COMPARISON:  03/13/2006 FINDINGS: Normal heart size. Lungs under aerated and clear. No pneumothorax or pleural effusion. IMPRESSION: No active cardiopulmonary disease. Electronically Signed   By: Marybelle Killings M.D.    On: 11/25/2017 11:51        Scheduled Meds: . allopurinol  100 mg Oral Daily  . darbepoetin (ARANESP) injection - NON-DIALYSIS  200 mcg Subcutaneous Q Tue-1800  . heparin injection (subcutaneous)  5,000 Units Subcutaneous Q8H  . multivitamin with minerals  1 tablet Oral Daily  . pantoprazole  40 mg Oral BID  . sodium chloride flush  5 mL Intracatheter Q8H   Continuous Infusions: . sodium chloride 125 mL/hr at 11/26/17 1134  . sodium chloride    . ceFEPime (MAXIPIME) IV Stopped (11/26/17 1113)     LOS: 12 days    Time spent: 35 minutes.     Elmarie Shiley, MD [facility] Pager [phone]  If 7PM-7AM, please contact night-coverage [email or web address] Password [email or web address] 11/26/2017, 2:50 PM

## 2017-11-26 NOTE — Progress Notes (Signed)
Subjective/Chief Complaint:  1 - Bilateral Hydronephrosis / Acute Renal Failure - Cr 17 by PCP labs on eval malaize 11/2017. Non-con CT with hydro to level of thickened bladder. Bilateral neph tubes placed 11/2017.  2 - Bladder Thickening / Retroperitoneal Lymphadenopathy / Rule Out Primary Bladder or Other GU Cancer - sigificant bladder trigone thickening by CT 11/2017. Denies h/o gross hematuria but microhematuria noted x several.   OR cysto, bladder biopsy, retrogrades, prostate biopsy, attempt bilateral stents pending for 12/08/17   3 - Extremely Elevated PSA / Likely Metastatic Prostate Cancer - Pt's grandfather with prostate cancer. PSA 11/2017 - 787 (extreme elevation). Prostate BX scheduled and pending.   4 - Fever - new low grade fevers noted 5/24 this admission. Lenna [place] 5/18 negative. New BCX, [place] 5/25 pending but NGTD. Placed on empiric cefepime. Dies malaise.   PMH sig for f HTN and hep C s/p harvoni txt. 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   Today "Leevon" is stable. Some slight GFR recovery since our last eval. UOP very high c/w still in post-obstructive diuresis phase. He is now scheduled for elective OR eval by [place] 12/08/17.     Objective: Vital signs in last 24 hours: Temp:  [98.8 F (37.1 C)-100 F (37.8 C)] 99.2 F (37.3 C) (05/26 0500) Pulse Rate:  [78-92] 92 (05/26 0500) Resp:  [18] 18 (05/26 0500) BP: (144-147)/(76-93) 147/93 (05/26 0500) SpO2:  [100 %] 100 % (05/26 0500) Last BM Date: 11/25/17  Intake/Output from previous day: 05/25 0701 - 05/26 0700 In: 1140 [P.O.:240; I.V.:900] Out: 4275 [Urine:4275] Intake/Output this shift: No intake/output data recorded.  General appearance: alert, cooperative, appears stated age and very pleasant. Son at bedside today.  Eyes: negative Nose: Nares normal. Septum midline. Mucosa normal. No drainage or sinus tenderness. Throat: lips, mucosa, and tongue normal;  teeth and gums normal Neck: supple, symmetrical, trachea midline Back: symmetric, no curvature. ROM normal. No CVA tenderness., neph tubes in place with copious urine that is thin and some blood tinged.  Resp: non-labored on room air.  Cardio: Nl rate Male genitalia: normal Extremities: extremities normal, atraumatic, no cyanosis or edema Pulses: 2+ and symmetric Skin: Skin color, texture, turgor normal. No rashes or lesions Lymph nodes: Cervical, supraclavicular, and axillary nodes normal. Neurologic: Grossly normal  Lab Results:  Recent Labs    11/25/17 0404 11/26/17 0319  WBC 20.1* 17.9*  HGB 7.1* 7.0*  HCT 21.7* 21.5*  PLT 272 272   BMET Recent Labs    11/25/17 0404 11/26/17 0319  NA 139 135  K 3.7 3.7  CL 103 103  CO2 25 22  GLUCOSE 122* 130*  BUN 69* 62*  CREATININE 7.48* 7.25*  CALCIUM 8.9 8.5*   PT/INR No results for input(s): LABPROT, INR in the last 72 hours. ABG No results for input(s): PHART, HCO3 in the last 72 hours.  Invalid input(s): PCO2, PO2  Studies/Results: Dg Chest 2 View  Result Date: 11/25/2017 CLINICAL DATA:  Fever EXAM: CHEST - 2 VIEW COMPARISON:  03/13/2006 FINDINGS: Normal heart size. Lungs under aerated and clear. No pneumothorax or pleural effusion. IMPRESSION: No active cardiopulmonary disease. Electronically Signed   By: Marybelle Killings M.D.   On: 11/25/2017 11:51    Anti-infectives: Anti-infectives (From admission, onward)   Start     Dose/Rate Route Frequency Ordered Stop   11/25/17 0830  ceFEPIme (MAXIPIME) 1 g in sodium chloride 0.9 % 100 mL IVPB  1 g 200 mL/hr over 30 Minutes Intravenous Every 24 hours 11/25/17 0822     11/16/17 0800  cefTRIAXone (ROCEPHIN) 2 g in sodium chloride 0.9 % 100 mL IVPB     2 g 200 mL/hr over 30 Minutes Intravenous To Radiology 11/15/17 1022 11/17/17 0800      Assessment/Plan:  1 - Bilateral Hydronephrosis / Acute Renal Failure - agree with neph tubes for now for likely severe GU level  obstruction. Volume status good, but reinforced to pt need for good hydration given his impressive duiresis. He likely will have permanent severe GFR compromise from this insult. This appears due to likley locally advanced stage 4 prostate cancer.   Continue to greatly appreciate nephrol management.   2 - Bladder Thickening / Retroperitoneal Lymphadenopathy / Rule Out Primary Bladder or Other GU Cancer - discussed with pt plan for OR cysto / retrogrades / bladder and biopsy in elective setting once over acute metabolic deragnement.  This is scheduled for 6/7 as of now.   3 - Extremely Elevated PSA / Likely Metastatic Prostate Cancer -  Pt with understanding of likely DX, prognosis, goals of care.  4 - Fevers - agree with current ABX pending CX results. Fortunately no hypotension / severe SIRS.  Will follow, please call me directly with questions anytime.   [facility], Jakya Dovidio 11/26/2017

## 2017-11-26 NOTE — Progress Notes (Signed)
[facility] Rounding Note  Subjective:  Creatinine stalled out at mid 7's UOP exceeding input greatly so still post obstructive phase Persistent low grade fevers On maxipine day 2  Vitals:   11/25/17 1739 11/25/17 2100 11/26/17 0114 11/26/17 0500  BP: (!) 145/76 (!) 144/86  (!) 147/93  Pulse: 78 87  92  Resp: 18 18  18   Temp: 100 F (37.8 C) 100 F (37.8 C) 98.8 F (37.1 C) 99.2 F (37.3 C)  TempSrc:  Oral Oral Oral  SpO2: 100% 100%  100%  Weight:      Height:       Exam: Large framed AAM VS as noted NAD No JVD Lung fields clear Abd soft, not tender Bilateral per tubes draining large amts urine Trace pitting edema both LE's Alert and OX3  Inpatient medications: . allopurinol  100 mg Oral Daily  . darbepoetin (ARANESP) injection - NON-DIALYSIS  200 mcg Subcutaneous Q Tue-1800  . heparin injection (subcutaneous)  5,000 Units Subcutaneous Q8H  . multivitamin with minerals  1 tablet Oral Daily  . pantoprazole  40 mg Oral BID  . sodium chloride flush  5 mL Intracatheter Q8H   . sodium chloride 75 mL/hr at 11/24/17 1703  . ceFEPime (MAXIPIME) IV Stopped (11/26/17 1113)   acetaminophen **OR** [DISCONTINUED] acetaminophen, calcium carbonate (dosed in mg elemental calcium), camphor-menthol **AND** hydrOXYzine, docusate sodium, feeding supplement (NEPRO CARB STEADY), hydrALAZINE, HYDROcodone-acetaminophen, metoprolol tartrate, ondansetron **OR** ondansetron (ZOFRAN) IV, sorbitol, zolpidem   Recent Labs  Lab 11/24/17 0625 11/25/17 0404 11/26/17 0319  NA 140 139 135  K 4.1 3.7 3.7  CL 104 103 103  CO2 26 25 22   GLUCOSE 102* 122* 130*  BUN 73* 69* 62*  CREATININE 7.46* 7.48* 7.25*  CALCIUM 9.0 8.9 8.5*  PHOS 4.2 3.9 4.0   Recent Labs  Lab 11/24/17 0625 11/25/17 0404 11/26/17 0319  ALBUMIN 2.7* 2.5* 2.5*   Recent Labs  Lab 11/24/17 0625 11/25/17 0404 11/26/17 0319  WBC 17.2* 20.1* 17.9*  HGB 7.3* 7.1* 7.0*  HCT 22.4* 21.7* 21.5*  MCV  76.7* 77.8* 77.6*  PLT 276 272 272   Iron/TIBC/Ferritin/ %Sat    Component Value Date/Time   IRON 23 (L) 11/22/2017 0511   TIBC 272 11/22/2017 0511   IRONPCTSAT 8 (L) 11/22/2017 5284    Background: 57 yo man, PMH Hep C (Harvoni), HTN, ED. Urinary sx for a month, treated with cipro. On lisinopril PTA for HTN.  Sent to ED by PCP 5/14 for abnl labs - creatinine 19, Hb 6.8. Imaging revealed bilateral hydro, had bilateral PCN's 11/15/17 w/slow improvement in renal function.  Not sure of recent baseline (in 2017 was 0.87). PSA >700. Metastatic prostate CA.   Assessment/Recommendations: 1. Renal failure. Not sure of most recent baseline (normal 2017). Creatinine 19 on admission, down to mid 7's and stalled there.  UOP still good, fluids at 75/hour, output greatly exceeds intake so continued post obstructive phase, ^ IVF back to 125/hr. Continue to monitor renal function. I hope we see more improvement.   2. Elevated PSA - 787. Highest I have ever seen. Has bladder thickening and LAN in the abdomen. Pt now aware of his Dx and prognosis (see Urology note). Surgical intervention scheduled for 6/7 for cysto, bx's, attempt stents  3. Anemia - rec'd PRBC's 3U since admission. Hb 7.4. Dosed Aranesp 200 on 5/21, next due 5/28. TSat 8%.  Feraheme X2 ordered, had 1st dose on 5/22, but holding on 2nd dose due to fevers/IV  ATB need. Recommend consideration of additional transfusions  4. Fever temp to 102.8. WBC to 20K. Blood and urine cultures done. Empiric cefipime. Will not redose feraheme in face of fever.  Dr. Erling Cruz will take over Nephrology f/u on 5/27  Jamal Maes, MD [facility] [phone] Pager 11/26/2017, 11:21 AM

## 2017-11-27 LAB — RENAL FUNCTION PANEL
ANION GAP: 11 (ref 5–15)
Albumin: 2.4 g/dL — ABNORMAL LOW (ref 3.5–5.0)
BUN: 63 mg/dL — AB (ref 6–20)
CALCIUM: 8.4 mg/dL — AB (ref 8.9–10.3)
CO2: 22 mmol/L (ref 22–32)
Chloride: 106 mmol/L (ref 101–111)
Creatinine, Ser: 7.24 mg/dL — ABNORMAL HIGH (ref 0.61–1.24)
GFR calc Af Amer: 9 mL/min — ABNORMAL LOW (ref >60)
GFR, EST NON AFRICAN AMERICAN: 8 mL/min — AB (ref >60)
GLUCOSE: 104 mg/dL — AB (ref 65–99)
PHOSPHORUS: 4.2 mg/dL (ref 2.5–4.6)
POTASSIUM: 4.3 mmol/L (ref 3.5–5.1)
SODIUM: 139 mmol/L (ref 135–145)

## 2017-11-27 LAB — CBC
HCT: 23 % — ABNORMAL LOW (ref 39.0–52.0)
Hemoglobin: 7.5 g/dL — ABNORMAL LOW (ref 13.0–17.0)
MCH: 25.4 pg — ABNORMAL LOW (ref 26.0–34.0)
MCHC: 32.6 g/dL (ref 30.0–36.0)
MCV: 78 fL (ref 78.0–100.0)
Platelets: 274 10*3/uL (ref 150–400)
RBC: 2.95 MIL/uL — ABNORMAL LOW (ref 4.22–5.81)
RDW: 16.7 % — AB (ref 11.5–15.5)
WBC: 13.8 10*3/uL — AB (ref 4.0–10.5)

## 2017-11-27 LAB — HEMOGLOBIN AND HEMATOCRIT, BLOOD
HEMATOCRIT: 26.3 % — AB (ref 39.0–52.0)
HEMOGLOBIN: 8.5 g/dL — AB (ref 13.0–17.0)

## 2017-11-27 LAB — PREPARE RBC (CROSSMATCH)

## 2017-11-27 MED ORDER — SODIUM CHLORIDE 0.9 % IV SOLN
Freq: Once | INTRAVENOUS | Status: AC
  Administered 2017-11-27: 11:00:00 via INTRAVENOUS

## 2017-11-27 MED ORDER — PREDNISONE 20 MG PO TABS
40.0000 mg | ORAL_TABLET | Freq: Once | ORAL | Status: AC
  Administered 2017-11-27: 40 mg via ORAL
  Filled 2017-11-27: qty 2

## 2017-11-27 NOTE — Progress Notes (Signed)
PROGRESS NOTE    Devon Cook  PYP:[id number] DOB: 07/11/1960 DOA: 11/14/2017 PCP: [name], MD    Brief Narrative: :57 year old male with history of hypertension and hep C admitted with malaise and difficulty voiding for 1-2 months, saw  PCP was treated with couple of rounds of antibiotics, had labs drawn which revealed a creatinine of 19 and was referred to the emergency room -CT revealed bilateral hydronephrosis at the level of the bladder with diffuse bladder wall thickening, bulky retroperitoneal lymphadenopathy -urology consulted, IR consulted, status post bilateral percutaneous nephrostomy tubes 5/15  -kidney function improving slowly, Renal and Urology following -plan for Outpatient workup for suspected metastatic prostate CA   Assessment & Plan:   Principal Problem:   ARF (acute renal failure) ([place]) Active Problems:   Hypertension   Morbid obesity with BMI of 40.0-44.9, adult ([place])   History of hepatitis C   GI bleed   Acute blood loss anemia   1-AKI;  -Postobstructive primarily, bilateral hydronephrosis, bladder CA suspected based on imaging -Lisinopril discontinue on admission.  -Urology consulted, Renal and IR consulted, creatinine peaked at 19 -status post bilateral nephrostomy tubes 5/15  -Back on 125 cc IV fluids.  -Cr today at 7.2. Out put 2.5  Fever, Leukocytosis;  He denies cough, diarrhea.  pan culture; Blood culture, no growth to date UA, urine culture 10 K , chest x ray negative   Continue with cefepime empirically. Day 2.  WBC trending down/ today at 13.    Bilateral severe hydronephrosis, bladder wall thickening and bulky retroperitoneal lymphadenopathy, status post bilateral nephrostomies as listed above Concerning with metastatic bladder ca and PSA elevated.  Urology recommends out patient cystoscopy and biopsy./  Patient aware of consideration and prognosis.    Anemia;  However patient also gives history of melena/black stools for  few days prior to admission, was Hemoccult positive in the emergency room, GI consulted,  plan to defer GI workup at this time due to multitude of other active medical problems, no further melena Hb at 7 Received  IV iron, aranesp.  Received  unit PRBC 5-26. Plan to transfuse another unit 5-27  Gouty Arthritis;  Improved after 2 doses of prednisone.  He report left ankle pain and finger pain 5-27. Will try a dose of prednisone.   History of hepatitis C  status post Harvoni Rx   DVT prophylaxis: heparin  Code Status: full code.  Family Communication: care discussed with patient.  Disposition Plan: home when renal function improved.    Consultants:  Nephrology  Urology  IR   Procedures: Bilateral PCNs 5/15    Antimicrobials: none  Subjective: He report left ankle pain again and left hand finger pain. Think gout is flaring again.  He is feeling ok, denies dyspnea.   Objective: Vitals:   11/26/17 1715 11/26/17 2000 11/26/17 2305 11/27/17 0705  BP: (!) 124/94 (!) 150/86 129/74 138/87  Pulse: 78 77 84 87  Resp: 18 16 18 18   Temp: 99 F (37.2 C) 99.9 F (37.7 C) 99.6 F (37.6 C) 98.7 F (37.1 C)  TempSrc: Oral Oral  Oral  SpO2: 100%  100% 100%  Weight:      Height:        Intake/Output Summary (Last 24 hours) at 11/27/2017 0859 Last data filed at 11/27/2017 0700 Gross per 24 hour  Intake 3742.92 ml  Output 2540 ml  Net 1202.92 ml   Filed Weights   11/24/17 0500 11/25/17 0434  Weight: (!) 136.6 kg (301  lb 2.4 oz) (!) 136.9 kg (301 lb 13 oz)    Examination:  General exam: NAD  Respiratory system: CTA Cardiovascular system: S 1, S 2 rrr Gastrointestinal system: BS present, soft, nt GI; bilateral nephrostomy tube.  Central nervous system: non focal.  Extremities: symmetric power.  Left hand with edema of finger.  Skin: no rashes     Data Reviewed: I have personally reviewed following labs and imaging studies  CBC: Recent Labs  Lab 11/23/17 0455  11/24/17 0625 11/25/17 0404 11/26/17 0319 11/27/17 0133  WBC 13.8* 17.2* 20.1* 17.9* 13.8*  HGB 7.4* 7.3* 7.1* 7.0* 7.5*  HCT 22.3* 22.4* 21.7* 21.5* 23.0*  MCV 76.1* 76.7* 77.8* 77.6* 78.0  PLT 280 276 272 272 626   Basic Metabolic Panel: Recent Labs  Lab 11/23/17 0455 11/24/17 0625 11/25/17 0404 11/26/17 0319 11/27/17 0133  NA 140 140 139 135 139  K 3.7 4.1 3.7 3.7 4.3  CL 101 104 103 103 106  CO2 28 26 25 22 22   GLUCOSE 97 102* 122* 130* 104*  BUN 83* 73* 69* 62* 63*  CREATININE 7.62* 7.46* 7.48* 7.25* 7.24*  CALCIUM 8.8* 9.0 8.9 8.5* 8.4*  PHOS 5.1* 4.2 3.9 4.0 4.2   GFR: Estimated Creatinine Clearance: 16.3 mL/min (A) (by C-G formula based on SCr of 7.24 mg/dL (H)). Liver Function Tests: Recent Labs  Lab 11/23/17 0455 11/24/17 0625 11/25/17 0404 11/26/17 0319 11/27/17 0133  ALBUMIN 2.6* 2.7* 2.5* 2.5* 2.4*   No results for input(s): LIPASE, AMYLASE in the last 168 hours. No results for input(s): AMMONIA in the last 168 hours. Coagulation Profile: No results for input(s): INR, PROTIME in the last 168 hours. Cardiac Enzymes: No results for input(s): CKTOTAL, CKMB, CKMBINDEX, TROPONINI in the last 168 hours. BNP (last 3 results) No results for input(s): PROBNP in the last 8760 hours. HbA1C: No results for input(s): HGBA1C in the last 72 hours. CBG: No results for input(s): GLUCAP in the last 168 hours. Lipid Profile: No results for input(s): CHOL, HDL, LDLCALC, TRIG, CHOLHDL, LDLDIRECT in the last 72 hours. Thyroid Function Tests: No results for input(s): TSH, T4TOTAL, FREET4, T3FREE, THYROIDAB in the last 72 hours. Anemia Panel: No results for input(s): VITAMINB12, FOLATE, FERRITIN, TIBC, IRON, RETICCTPCT in the last 72 hours. Sepsis Labs: No results for input(s): PROCALCITON, LATICACIDVEN in the last 168 hours.  Recent Results (from the past 240 hour(s))  Urine Culture     Status: None   Collection Time: 11/18/17 12:01 PM  Result Value Ref Range  Status   Specimen Description KIDNEY LEFT  Final   Special Requests NONE  Final   Culture   Final    NO GROWTH Performed at [facility], [address], [place], [place] [address]    Report Status 11/19/2017 FINAL  Final  Urine Culture     Status: None   Collection Time: 11/18/17 12:02 PM  Result Value Ref Range Status   Specimen Description KIDNEY RIGHT  Final   Special Requests NONE  Final   Culture   Final    NO GROWTH Performed at [facility], [place] [address], [place], [place] [address]    Report Status 11/19/2017 FINAL  Final  Urine Culture     Status: Abnormal   Collection Time: 11/25/17  7:55 AM  Result Value Ref Range Status   Specimen Description URINE, CLEAN CATCH  Final   Special Requests NONE  Final   Culture (A)  Final    <  10,000 COLONIES/mL INSIGNIFICANT GROWTH Performed at [facility], [place] [address], [place], [place] [address]    Report Status 11/26/2017 FINAL  Final  Culture, blood (routine x 2)     Status: None (Preliminary result)   Collection Time: 11/25/17  9:02 AM  Result Value Ref Range Status   Specimen Description BLOOD LEFT HAND  Final   Special Requests   Final    BOTTLES DRAWN AEROBIC AND ANAEROBIC Blood Culture adequate volume   Culture   Final    NO GROWTH 1 DAY Performed at [facility], [place] [address] [place], [place] [address]    Report Status PENDING  Incomplete  Culture, blood (routine x 2)     Status: None (Preliminary result)   Collection Time: 11/25/17  9:02 AM  Result Value Ref Range Status   Specimen Description BLOOD LEFT HAND  Final   Special Requests   Final    BOTTLES DRAWN AEROBIC AND ANAEROBIC Blood Culture adequate volume   Culture   Final    NO GROWTH 1 DAY Performed at [facility], [place] [address], [place], [place] [address]    Report Status PENDING  Incomplete         Radiology Studies: Dg Chest 2 View  Result Date: 11/25/2017 CLINICAL DATA:  Fever EXAM: CHEST - 2 VIEW  COMPARISON:  03/13/2006 FINDINGS: Normal heart size. Lungs under aerated and clear. No pneumothorax or pleural effusion. IMPRESSION: No active cardiopulmonary disease. Electronically Signed   By: Marybelle Killings M.D.   On: 11/25/2017 11:51        Scheduled Meds: . allopurinol  100 mg Oral Daily  . darbepoetin (ARANESP) injection - NON-DIALYSIS  200 mcg Subcutaneous Q Tue-1800  . heparin injection (subcutaneous)  5,000 Units Subcutaneous Q8H  . multivitamin with minerals  1 tablet Oral Daily  . pantoprazole  40 mg Oral BID  . predniSONE  40 mg Oral Once  . sodium chloride flush  5 mL Intracatheter Q8H   Continuous Infusions: . sodium chloride 125 mL/hr at 11/27/17 0403  . sodium chloride    . ceFEPime (MAXIPIME) IV Stopped (11/26/17 1113)     LOS: 13 days    Time spent: 35 minutes.     Elmarie Shiley, MD [facility] Pager [phone]  If 7PM-7AM, please contact night-coverage [email or web address] Password [email or web address] 11/27/2017, 8:59 AM

## 2017-11-27 NOTE — Progress Notes (Signed)
Background: 57 yo man, PMH Hep C (Harvoni), HTN, ED. Urinary sx for a month, treated with cipro. On lisinopril PTA for HTN.  Sent to ED by PCP 5/14 for abnl labs - creatinine 19, Hb 6.8. Imaging revealed bilateral hydro, had bilateral PCN's 11/15/17 w/slow improvement in renal function.  Not sure of recent baseline (in 2017 was 0.87). PSA >700. Metastatic prostate CA.   Assessment/Recommendations: 1.   Obstructive uropathy with AKI, ? Baseline.  Will increase hydration and stop fluid restriction.  2. Elevated PSA - 787. Has bladder thickening and LAN in the abdomen. Urol  scheduled for 6/7 for cysto, bx's, attempt stents  3. Anemia - PRBC's x 3U since admission. Hb 7.4. Dosed Aranesp 200 on 5/21, next due 5/28. TSat 8%.  Feraheme X2   4. Fever temp to 102.8. WBC to 20K. Blood and urine cultures done neg so far. Empiric cefipime.   5. HBP, off lisinopril    Subjective: Interval History: good UOP  Objective: Vital signs in last 24 hours: Temp:  [98 F (36.7 C)-99.9 F (37.7 C)] 98.8 F (37.1 C) (05/27 1325) Pulse Rate:  [74-87] 74 (05/27 1325) Resp:  [16-20] 18 (05/27 1325) BP: (124-152)/(74-96) 151/78 (05/27 1325) SpO2:  [98 %-100 %] 100 % (05/27 1325) Weight change:   Intake/Output from previous day: 05/26 0701 - 05/27 0700 In: 3742.9 [P.O.:360; I.V.:2367.9; Blood:315; IV Piggyback:200] Out: 5929 [Urine:2540] Intake/Output this shift: Total I/O In: 315 [Blood:315] Out: 1925 [Urine:1925]  General appearance: alert and cooperative Chest wall: no tenderness, bilat PCN tubes Cardio: regular rate and rhythm, S1, S2 normal, no murmur, click, rub or gallop GI: soft, non-tender; bowel sounds normal; no masses,  no organomegaly Extremities: extremities normal, atraumatic, no cyanosis or edema  Lab Results: Recent Labs    11/26/17 0319 11/27/17 0133  WBC 17.9* 13.8*  HGB 7.0* 7.5*  HCT 21.5* 23.0*  PLT 272 274   BMET:  Recent Labs    11/26/17 0319 11/27/17 0133   NA 135 139  K 3.7 4.3  CL 103 106  CO2 22 22  GLUCOSE 130* 104*  BUN 62* 63*  CREATININE 7.25* 7.24*  CALCIUM 8.5* 8.4*   No results for input(s): PTH in the last 72 hours. Iron Studies: No results for input(s): IRON, TIBC, TRANSFERRIN, FERRITIN in the last 72 hours. Studies/Results: No results found.  Scheduled: . allopurinol  100 mg Oral Daily  . darbepoetin (ARANESP) injection - NON-DIALYSIS  200 mcg Subcutaneous Q Tue-1800  . heparin injection (subcutaneous)  5,000 Units Subcutaneous Q8H  . multivitamin with minerals  1 tablet Oral Daily  . pantoprazole  40 mg Oral BID  . sodium chloride flush  5 mL Intracatheter Q8H      LOS: 13 days   Estanislado Emms 11/27/2017,2:17 PM

## 2017-11-28 LAB — BPAM RBC
BLOOD PRODUCT EXPIRATION DATE: 201906022359
BLOOD PRODUCT EXPIRATION DATE: 201906142359
ISSUE DATE / TIME: 201905261646
ISSUE DATE / TIME: 201905271056
UNIT TYPE AND RH: 7300
Unit Type and Rh: 1700

## 2017-11-28 LAB — TYPE AND SCREEN
ABO/RH(D): B POS
ANTIBODY SCREEN: NEGATIVE
Unit division: 0
Unit division: 0

## 2017-11-28 LAB — RENAL FUNCTION PANEL
ANION GAP: 11 (ref 5–15)
Albumin: 2.3 g/dL — ABNORMAL LOW (ref 3.5–5.0)
BUN: 61 mg/dL — ABNORMAL HIGH (ref 6–20)
CALCIUM: 8.5 mg/dL — AB (ref 8.9–10.3)
CO2: 21 mmol/L — AB (ref 22–32)
Chloride: 106 mmol/L (ref 101–111)
Creatinine, Ser: 6.36 mg/dL — ABNORMAL HIGH (ref 0.61–1.24)
GFR calc Af Amer: 10 mL/min — ABNORMAL LOW (ref >60)
GFR calc non Af Amer: 9 mL/min — ABNORMAL LOW (ref >60)
GLUCOSE: 130 mg/dL — AB (ref 65–99)
POTASSIUM: 4.5 mmol/L (ref 3.5–5.1)
Phosphorus: 3.5 mg/dL (ref 2.5–4.6)
SODIUM: 138 mmol/L (ref 135–145)

## 2017-11-28 LAB — CBC
HEMATOCRIT: 24.1 % — AB (ref 39.0–52.0)
HEMOGLOBIN: 7.9 g/dL — AB (ref 13.0–17.0)
MCH: 25.6 pg — AB (ref 26.0–34.0)
MCHC: 32.8 g/dL (ref 30.0–36.0)
MCV: 78.2 fL (ref 78.0–100.0)
Platelets: 288 10*3/uL (ref 150–400)
RBC: 3.08 MIL/uL — ABNORMAL LOW (ref 4.22–5.81)
RDW: 17 % — AB (ref 11.5–15.5)
WBC: 14.6 10*3/uL — ABNORMAL HIGH (ref 4.0–10.5)

## 2017-11-28 MED ORDER — PREDNISONE 20 MG PO TABS
40.0000 mg | ORAL_TABLET | Freq: Once | ORAL | Status: AC
Start: 2017-11-28 — End: 2017-11-28
  Administered 2017-11-28: 40 mg via ORAL
  Filled 2017-11-28: qty 2

## 2017-11-28 NOTE — Progress Notes (Signed)
Background: 57 yo man, PMH Hep C (Harvoni), HTN, ED. Urinary sx for a month, treated with cipro. On lisinopril PTA for HTN. Sent to ED by PCP 5/14 for abnl labs - creatinine 19, Hb 6.8. Imaging revealed bilateral hydro, had bilateral PCN's 11/15/17 w/slow improvement in renal function. Not sure of recent baseline (in 2017 was 0.87). PSA >700. Metastatic prostate CA.   Assessment/Recommendations: 1.   Obstructive uropathy with AKI, ? Baseline.  Will decrease rate of hydration.  2. Elevated PSA - 787. Has bladder thickening and LAN in the abdomen. Urol  scheduled for 6/7 for cysto, bx's, attempt stents  3.   HBP, off lisinopril   Subjective: Interval History: Improved UOP with IVF  Objective: Vital signs in last 24 hours: Temp:  [97.8 F (36.6 C)-98.7 F (37.1 C)] 97.8 F (36.6 C) (05/28 1330) Pulse Rate:  [64-80] 64 (05/28 1330) Resp:  [18-20] 20 (05/28 0501) BP: (110-155)/(67-93) 155/93 (05/28 1330) SpO2:  [97 %-100 %] 100 % (05/28 1330) Weight change:   Intake/Output from previous day: 05/27 0701 - 05/28 0700 In: 4129.2 [I.V.:3269.2; Blood:315; IV Piggyback:100] Out: 5845 [Urine:5845] Intake/Output this shift: Total I/O In: -  Out: 1300 [Urine:1300]  General appearance: alert and cooperative Resp: clear to auscultation bilaterally Chest wall: no tenderness Cardio: regular rate and rhythm, S1, S2 normal, no murmur, click, rub or gallop Extremities: edema 1+  Lab Results: Recent Labs    11/27/17 0133 11/27/17 1645 11/28/17 0446  WBC 13.8*  --  14.6*  HGB 7.5* 8.5* 7.9*  HCT 23.0* 26.3* 24.1*  PLT 274  --  288   BMET:  Recent Labs    11/27/17 0133 11/28/17 0446  NA 139 138  K 4.3 4.5  CL 106 106  CO2 22 21*  GLUCOSE 104* 130*  BUN 63* 61*  CREATININE 7.24* 6.36*  CALCIUM 8.4* 8.5*   No results for input(s): PTH in the last 72 hours. Iron Studies: No results for input(s): IRON, TIBC, TRANSFERRIN, FERRITIN in the last 72 hours. Studies/Results: No  results found.  Scheduled: . allopurinol  100 mg Oral Daily  . darbepoetin (ARANESP) injection - NON-DIALYSIS  200 mcg Subcutaneous Q Tue-1800  . heparin injection (subcutaneous)  5,000 Units Subcutaneous Q8H  . multivitamin with minerals  1 tablet Oral Daily  . pantoprazole  40 mg Oral BID  . sodium chloride flush  5 mL Intracatheter Q8H     LOS: 14 days   Devon Cook 11/28/2017,2:48 PM

## 2017-11-28 NOTE — Progress Notes (Signed)
PROGRESS NOTE    Devon Cook  GDJ:[id number] DOB: 1960-12-11 DOA: 11/14/2017 PCP: [name], MD    Brief Narrative: :57 year old male with history of hypertension and hep C admitted with malaise and difficulty voiding for 1-2 months, saw  PCP was treated with couple of rounds of antibiotics, had labs drawn which revealed a creatinine of 19 and was referred to the emergency room -CT revealed bilateral hydronephrosis at the level of the bladder with diffuse bladder wall thickening, bulky retroperitoneal lymphadenopathy -urology consulted, IR consulted, status post bilateral percutaneous nephrostomy tubes 5/15  -kidney function improving slowly, Renal and Urology following -plan for Outpatient workup for suspected metastatic prostate CA  Patient still requiring IV fluids for renal failure. Cr slowly trending down. He had fever and leukocytosis of unclear etiology which improved with cefepime. Would treat him  for 7 days of  antibiotics.   Assessment & Plan:   Principal Problem:   ARF (acute renal failure) ([facility]) Active Problems:   Hypertension   Morbid obesity with BMI of 40.0-44.9, adult ([facility])   History of hepatitis C   GI bleed   Acute blood loss anemia   1-AKI;  -Postobstructive primarily, bilateral hydronephrosis, bladder CA suspected based on imaging -Lisinopril discontinue on admission.  -Urology consulted, Renal and IR consulted, creatinine peaked at 19 -status post bilateral nephrostomy tubes 5/15  -IV fluids 100 cc per hours.  -Cr today at 6.3 . Out put 5.8 L nephrology managing.   Fever, Leukocytosis;  He denies cough, diarrhea.  pan culture; Blood culture, no growth to date UA, urine culture 10 K , chest x ray negative   Continue with cefepime empirically. Day 3.  WBC trending down/ today at 14. WBC increase today suspect related to prednisone. He has received 2 doses of prednisone on 5-27 and 5-28. Notice WBC was elevated prior to prednisone.      Bilateral severe hydronephrosis, bladder wall thickening and bulky retroperitoneal lymphadenopathy, status post bilateral nephrostomies as listed above Concerning with metastatic bladder ca and PSA elevated.  Urology recommends out patient cystoscopy and biopsy./  Patient aware of consideration and prognosis.    Anemia;  However patient also gives history of melena/black stools for few days prior to admission, was Hemoccult positive in the emergency room, GI consulted,  plan to defer GI workup at this time due to multitude of other active medical problems, no further melena Hb at 7 Received  IV iron, aranesp.  Received  unit PRBC 5-26 and another unit 5-27  Gouty Arthritis;  Improved after 2 doses of prednisone.  He report left ankle pain and finger pain 5-27. He had prednisone 5-27. Will repeat another dose. Further doses depending on symptoms   History of hepatitis C  status post Harvoni Rx   DVT prophylaxis: heparin  Code Status: full code.  Family Communication: care discussed with patient.  Disposition Plan: home when renal function improved.    Consultants:  Nephrology  Urology  IR   Procedures: Bilateral PCNs 5/15    Antimicrobials: none  Subjective: He report left ankle pain again and left hand finger pain. Think gout is flaring again.  He is feeling ok, denies dyspnea.   Objective: Vitals:   11/27/17 1433 11/27/17 2254 11/27/17 2320 11/28/17 0501  BP:  129/87 110/67 126/82  Pulse:  80 69 73  Resp:  18  20  Temp:  98.7 F (37.1 C)  98.3 F (36.8 C)  TempSrc:  Oral  Oral  SpO2:  100%  99% 97%  Weight: 132.6 kg (292 lb 6.4 oz)     Height:        Intake/Output Summary (Last 24 hours) at 11/28/2017 1108 Last data filed at 11/28/2017 4097 Gross per 24 hour  Intake 4129.16 ml  Output 6145 ml  Net -2015.84 ml   Filed Weights   11/25/17 0434 11/27/17 1433  Weight: (!) 136.9 kg (301 lb 13 oz) 132.6 kg (292 lb 6.4 oz)    Examination:  General  exam: NAD  Respiratory system: CTA Cardiovascular system: S 1, S 2 RRR Gastrointestinal system: BS present, soft, nt GI; bilateral nephrostomy tube.  Central nervous system: Non focal.  Extremities: symmetric power, left had with edema,  Skin: no rashes     Data Reviewed: I have personally reviewed following labs and imaging studies  CBC: Recent Labs  Lab 11/24/17 0625 11/25/17 0404 11/26/17 0319 11/27/17 0133 11/27/17 1645 11/28/17 0446  WBC 17.2* 20.1* 17.9* 13.8*  --  14.6*  HGB 7.3* 7.1* 7.0* 7.5* 8.5* 7.9*  HCT 22.4* 21.7* 21.5* 23.0* 26.3* 24.1*  MCV 76.7* 77.8* 77.6* 78.0  --  78.2  PLT 276 272 272 274  --  353   Basic Metabolic Panel: Recent Labs  Lab 11/24/17 0625 11/25/17 0404 11/26/17 0319 11/27/17 0133 11/28/17 0446  NA 140 139 135 139 138  K 4.1 3.7 3.7 4.3 4.5  CL 104 103 103 106 106  CO2 26 25 22 22  21*  GLUCOSE 102* 122* 130* 104* 130*  BUN 73* 69* 62* 63* 61*  CREATININE 7.46* 7.48* 7.25* 7.24* 6.36*  CALCIUM 9.0 8.9 8.5* 8.4* 8.5*  PHOS 4.2 3.9 4.0 4.2 3.5   GFR: Estimated Creatinine Clearance: 18.3 mL/min (A) (by C-G formula based on SCr of 6.36 mg/dL (H)). Liver Function Tests: Recent Labs  Lab 11/24/17 0625 11/25/17 0404 11/26/17 0319 11/27/17 0133 11/28/17 0446  ALBUMIN 2.7* 2.5* 2.5* 2.4* 2.3*   No results for input(s): LIPASE, AMYLASE in the last 168 hours. No results for input(s): AMMONIA in the last 168 hours. Coagulation Profile: No results for input(s): INR, PROTIME in the last 168 hours. Cardiac Enzymes: No results for input(s): CKTOTAL, CKMB, CKMBINDEX, TROPONINI in the last 168 hours. BNP (last 3 results) No results for input(s): PROBNP in the last 8760 hours. HbA1C: No results for input(s): HGBA1C in the last 72 hours. CBG: No results for input(s): GLUCAP in the last 168 hours. Lipid Profile: No results for input(s): CHOL, HDL, LDLCALC, TRIG, CHOLHDL, LDLDIRECT in the last 72 hours. Thyroid Function Tests: No  results for input(s): TSH, T4TOTAL, FREET4, T3FREE, THYROIDAB in the last 72 hours. Anemia Panel: No results for input(s): VITAMINB12, FOLATE, FERRITIN, TIBC, IRON, RETICCTPCT in the last 72 hours. Sepsis Labs: No results for input(s): PROCALCITON, LATICACIDVEN in the last 168 hours.  Recent Results (from the past 240 hour(s))  Urine Culture     Status: None   Collection Time: 11/18/17 12:01 PM  Result Value Ref Range Status   Specimen Description KIDNEY LEFT  Final   Special Requests NONE  Final   Culture   Final    NO GROWTH Performed at [facility], [address], [place], [place] [address]    Report Status 11/19/2017 FINAL  Final  Urine Culture     Status: None   Collection Time: 11/18/17 12:02 PM  Result Value Ref Range Status   Specimen Description KIDNEY RIGHT  Final   Special Requests NONE  Final   Culture  Final    NO GROWTH Performed at [facility], [place] [address], [place], [place] [address]    Report Status 11/19/2017 FINAL  Final  Urine Culture     Status: Abnormal   Collection Time: 11/25/17  7:55 AM  Result Value Ref Range Status   Specimen Description URINE, CLEAN CATCH  Final   Special Requests NONE  Final   Culture (A)  Final    <10,000 COLONIES/mL INSIGNIFICANT GROWTH Performed at [facility], [place] [address], [place], [place] [address]    Report Status 11/26/2017 FINAL  Final  Culture, blood (routine x 2)     Status: None (Preliminary result)   Collection Time: 11/25/17  9:02 AM  Result Value Ref Range Status   Specimen Description BLOOD LEFT HAND  Final   Special Requests   Final    BOTTLES DRAWN AEROBIC AND ANAEROBIC Blood Culture adequate volume   Culture   Final    NO GROWTH 2 DAYS Performed at [facility], [place] [address], [place], [place] [address]    Report Status PENDING  Incomplete  Culture, blood (routine x 2)     Status: None (Preliminary result)   Collection Time: 11/25/17  9:02 AM  Result Value Ref Range  Status   Specimen Description BLOOD LEFT HAND  Final   Special Requests   Final    BOTTLES DRAWN AEROBIC AND ANAEROBIC Blood Culture adequate volume   Culture   Final    NO GROWTH 2 DAYS Performed at [facility], [place] [address] [place], [place] [address]    Report Status PENDING  Incomplete         Radiology Studies: No results found.      Scheduled Meds: . allopurinol  100 mg Oral Daily  . darbepoetin (ARANESP) injection - NON-DIALYSIS  200 mcg Subcutaneous Q Tue-1800  . heparin injection (subcutaneous)  5,000 Units Subcutaneous Q8H  . multivitamin with minerals  1 tablet Oral Daily  . pantoprazole  40 mg Oral BID  . predniSONE  40 mg Oral Once  . sodium chloride flush  5 mL Intracatheter Q8H   Continuous Infusions: . sodium chloride 175 mL/hr at 11/28/17 0536  . ceFEPime (MAXIPIME) IV Stopped (11/28/17 0845)     LOS: 14 days    Time spent: 35 minutes.     Elmarie Shiley, MD [facility] Pager [phone]  If 7PM-7AM, please contact night-coverage [email or web address] Password [email or web address] 11/28/2017, 11:08 AM

## 2017-11-29 ENCOUNTER — Telehealth: Payer: Self-pay

## 2017-11-29 LAB — RENAL FUNCTION PANEL
ANION GAP: 9 (ref 5–15)
Albumin: 2.4 g/dL — ABNORMAL LOW (ref 3.5–5.0)
BUN: 59 mg/dL — ABNORMAL HIGH (ref 6–20)
CO2: 20 mmol/L — AB (ref 22–32)
Calcium: 8.4 mg/dL — ABNORMAL LOW (ref 8.9–10.3)
Chloride: 109 mmol/L (ref 101–111)
Creatinine, Ser: 5.69 mg/dL — ABNORMAL HIGH (ref 0.61–1.24)
GFR calc non Af Amer: 10 mL/min — ABNORMAL LOW (ref >60)
GFR, EST AFRICAN AMERICAN: 12 mL/min — AB (ref >60)
GLUCOSE: 99 mg/dL (ref 65–99)
POTASSIUM: 4.6 mmol/L (ref 3.5–5.1)
Phosphorus: 3.5 mg/dL (ref 2.5–4.6)
Sodium: 138 mmol/L (ref 135–145)

## 2017-11-29 NOTE — Telephone Encounter (Signed)
no

## 2017-11-29 NOTE — Telephone Encounter (Signed)
Pt ids having surgery 12-08-17. Per [facility] . Do you need any office notes [facility]

## 2017-11-29 NOTE — Progress Notes (Signed)
PROGRESS NOTE    Devon GRISSETT  ELF:[id number] DOB: 10/03/1960 DOA: 11/14/2017 PCP: [name], MD    Brief Narrative: :57 year old male with history of hypertension and hep C admitted with malaise and difficulty voiding for 1-2 months, saw  PCP was treated with couple of rounds of antibiotics, had labs drawn which revealed a creatinine of 19 and was referred to the emergency room -CT revealed bilateral hydronephrosis at the level of the bladder with diffuse bladder wall thickening, bulky retroperitoneal lymphadenopathy -urology consulted, IR consulted, status post bilateral percutaneous nephrostomy tubes 5/15  -kidney function improving slowly, Renal and Urology following -plan for Outpatient workup for suspected metastatic prostate CA   Assessment & Plan:   AKI;  -Postobstructive primarily, bilateral hydronephrosis, Metastatic Prostate CA suspected  -Urology consulted, Renal and IR consulted, creatinine peaked at 19 -status post bilateral nephrostomy tubes 5/15  -creatinine slowly trending down from peak of 19 down to 5.6 today -nephrology following -On IV fluids, urine output continues to be very high  Fever, Leukocytosis;  He denies cough, abdominal symptoms, diarrhea, rash etc.  pan culture; Blood culture, no growth to date UA, urine culture 10 K , chest x ray negative  -I suspect this was secondary to recurrent gout flare, requiring additional doses of prednisone on 5/27 and 5/28 -Infectious workup negative -Stop cefepime and monitor   Bilateral severe hydronephrosis, bladder wall thickening and bulky retroperitoneal lymphadenopathy, status post bilateral nephrostomies as listed above Concerning with metastatic prostate CA, very high PSA  -Urology recommends out patient cystoscopy and biopsy./  Patient aware of consideration and prognosis.   -plan for cystoscopy with bladder biopsy, prostate biopsy, attempted bilateral stents 46/7 by Dr. Tresa Moore  Anemia;  -due to  chronic disease from malignancy, acute illness, hemodilution -patient also gives history of melena/black stools for few days prior to admission, was Hemoccult positive in the emergency room, GI consulted,  plan to defer GI workup at this time due to multitude of other active medical problems, no further melena -patient received couple of units of blood to admission and subsequently 1 unit on 5/26 and another on 5/27 -Hemoglobin stable now Received  IV iron, aranesp.   Gouty Arthritis;  -improved after another round of prednisone -Level is 14.5 admission -continue low-dose allopurinol  History of hepatitis C  status post Harvoni Rx   DVT prophylaxis: heparin  Code Status: full code.  Family Communication: care discussed with patient.  Disposition Plan: home when renal function improved.    Consultants:  Nephrology  Urology  IR   Procedures: Bilateral PCNs 5/15    Antimicrobials: none  Subjective: -feels better, joints improving  Objective: Vitals:   11/28/17 2022 11/28/17 2131 11/29/17 0540 11/29/17 1330  BP:  126/71 140/88 (!) 130/92  Pulse:  71 65 89  Resp:  18 18 14   Temp:  98.8 F (37.1 C) 98.5 F (36.9 C) 98.2 F (36.8 C)  TempSrc:  Oral  Oral  SpO2:  100% 100% 98%  Weight: 136 kg (299 lb 14.4 oz)  136 kg (299 lb 14.4 oz)   Height:        Intake/Output Summary (Last 24 hours) at 11/29/2017 1534 Last data filed at 11/29/2017 1145 Gross per 24 hour  Intake 3570.42 ml  Output 5900 ml  Net -2329.58 ml   Filed Weights   11/28/17 1527 11/28/17 2022 11/29/17 0540  Weight: 135.1 kg (297 lb 14.4 oz) 136 kg (299 lb 14.4 oz) 136 kg (299 lb 14.4 oz)  Examination:  Gen: Awake, Alert, Oriented X 3,  HEENT: PERRLA, Neck supple, no JVD Lungs: Good air movement bilaterally, CTAB CVS: RRR,No Gallops,Rubs or new Murmurs Abd: soft, Non tender, non distended, BS present Extremities: trace edema Skin: no new rashes     Data Reviewed: I have personally  reviewed following labs and imaging studies  CBC: Recent Labs  Lab 11/24/17 0625 11/25/17 0404 11/26/17 0319 11/27/17 0133 11/27/17 1645 11/28/17 0446  WBC 17.2* 20.1* 17.9* 13.8*  --  14.6*  HGB 7.3* 7.1* 7.0* 7.5* 8.5* 7.9*  HCT 22.4* 21.7* 21.5* 23.0* 26.3* 24.1*  MCV 76.7* 77.8* 77.6* 78.0  --  78.2  PLT 276 272 272 274  --  269   Basic Metabolic Panel: Recent Labs  Lab 11/25/17 0404 11/26/17 0319 11/27/17 0133 11/28/17 0446 11/29/17 0438  NA 139 135 139 138 138  K 3.7 3.7 4.3 4.5 4.6  CL 103 103 106 106 109  CO2 25 22 22  21* 20*  GLUCOSE 122* 130* 104* 130* 99  BUN 69* 62* 63* 61* 59*  CREATININE 7.48* 7.25* 7.24* 6.36* 5.69*  CALCIUM 8.9 8.5* 8.4* 8.5* 8.4*  PHOS 3.9 4.0 4.2 3.5 3.5   GFR: Estimated Creatinine Clearance: 20.7 mL/min (A) (by C-G formula based on SCr of 5.69 mg/dL (H)). Liver Function Tests: Recent Labs  Lab 11/25/17 0404 11/26/17 0319 11/27/17 0133 11/28/17 0446 11/29/17 0438  ALBUMIN 2.5* 2.5* 2.4* 2.3* 2.4*   No results for input(s): LIPASE, AMYLASE in the last 168 hours. No results for input(s): AMMONIA in the last 168 hours. Coagulation Profile: No results for input(s): INR, PROTIME in the last 168 hours. Cardiac Enzymes: No results for input(s): CKTOTAL, CKMB, CKMBINDEX, TROPONINI in the last 168 hours. BNP (last 3 results) No results for input(s): PROBNP in the last 8760 hours. HbA1C: No results for input(s): HGBA1C in the last 72 hours. CBG: No results for input(s): GLUCAP in the last 168 hours. Lipid Profile: No results for input(s): CHOL, HDL, LDLCALC, TRIG, CHOLHDL, LDLDIRECT in the last 72 hours. Thyroid Function Tests: No results for input(s): TSH, T4TOTAL, FREET4, T3FREE, THYROIDAB in the last 72 hours. Anemia Panel: No results for input(s): VITAMINB12, FOLATE, FERRITIN, TIBC, IRON, RETICCTPCT in the last 72 hours. Sepsis Labs: No results for input(s): PROCALCITON, LATICACIDVEN in the last 168 hours.  Recent  Results (from the past 240 hour(s))  Urine Culture     Status: Abnormal   Collection Time: 11/25/17  7:55 AM  Result Value Ref Range Status   Specimen Description URINE, CLEAN CATCH  Final   Special Requests NONE  Final   Culture (A)  Final    <10,000 COLONIES/mL INSIGNIFICANT GROWTH Performed at [facility], [address], [place], [place] [address]    Report Status 11/26/2017 FINAL  Final  Culture, blood (routine x 2)     Status: None (Preliminary result)   Collection Time: 11/25/17  9:02 AM  Result Value Ref Range Status   Specimen Description BLOOD LEFT HAND  Final   Special Requests   Final    BOTTLES DRAWN AEROBIC AND ANAEROBIC Blood Culture adequate volume   Culture   Final    NO GROWTH 4 DAYS Performed at [facility], [place] [address] [place], [place] [address]    Report Status PENDING  Incomplete  Culture, blood (routine x 2)     Status: None (Preliminary result)   Collection Time: 11/25/17  9:02 AM  Result Value Ref Range Status  Specimen Description BLOOD LEFT HAND  Final   Special Requests   Final    BOTTLES DRAWN AEROBIC AND ANAEROBIC Blood Culture adequate volume   Culture   Final    NO GROWTH 4 DAYS Performed at [facility], [address] [place], [place] [address]    Report Status PENDING  Incomplete         Radiology Studies: No results found.      Scheduled Meds: . allopurinol  100 mg Oral Daily  . darbepoetin (ARANESP) injection - NON-DIALYSIS  200 mcg Subcutaneous Q Tue-1800  . heparin injection (subcutaneous)  5,000 Units Subcutaneous Q8H  . multivitamin with minerals  1 tablet Oral Daily  . pantoprazole  40 mg Oral BID  . sodium chloride flush  5 mL Intracatheter Q8H   Continuous Infusions:    LOS: 15 days    Time spent: 35 minutes.     Domenic Polite, MD [facility] Page via [email or web address] password [email or web address]  If 7PM-7AM, please contact night-coverage  11/29/2017, 3:34 PM

## 2017-11-29 NOTE — Progress Notes (Signed)
Subjective: Interval History: has complaints wants to get free of ivs and move about.  Eating and drinking well..  Objective: Vital signs in last 24 hours: Temp:  [98.2 F (36.8 C)-98.8 F (37.1 C)] 98.2 F (36.8 C) (05/29 1330) Pulse Rate:  [65-89] 89 (05/29 1330) Resp:  [14-18] 14 (05/29 1330) BP: (126-140)/(71-92) 130/92 (05/29 1330) SpO2:  [98 %-100 %] 98 % (05/29 1330) Weight:  [135.1 kg (297 lb 14.4 oz)-136 kg (299 lb 14.4 oz)] 136 kg (299 lb 14.4 oz) (05/29 0540) Weight change: 2.495 kg (5 lb 8 oz)  Intake/Output from previous day: 05/28 0701 - 05/29 0700 In: 2798.8 [P.O.:240; I.V.:2438.8; IV Piggyback:100] Out: 5725 [Urine:5725] Intake/Output this shift: Total I/O In: 771.7 [P.O.:260; I.V.:511.7] Out: 1475 [Urine:1475]  General appearance: no distress and moderately obese Resp: diminished breath sounds bilaterally Cardio: S1, S2 normal and systolic murmur: systolic ejection 2/6, decrescendo at 2nd left intercostal space GI: obese, pos bs , PCNx tubes Extremities: edema 2+  Lab Results: Recent Labs    11/27/17 0133 11/27/17 1645 11/28/17 0446  WBC 13.8*  --  14.6*  HGB 7.5* 8.5* 7.9*  HCT 23.0* 26.3* 24.1*  PLT 274  --  288   BMET:  Recent Labs    11/28/17 0446 11/29/17 0438  NA 138 138  K 4.5 4.6  CL 106 109  CO2 21* 20*  GLUCOSE 130* 99  BUN 61* 59*  CREATININE 6.36* 5.69*  CALCIUM 8.5* 8.4*   No results for input(s): PTH in the last 72 hours. Iron Studies: No results for input(s): IRON, TIBC, TRANSFERRIN, FERRITIN in the last 72 hours.  Studies/Results: No results found.  I have reviewed the patient's current medications.  Assessment/Plan: 1 AKI steady improvement.  Mild acidemia.  Will see if can maintain with pos 2 HTN lower vol, follow 3 Obstructive uropathy with Prostate Ca. Per Urology 4 Obesity 5 Hep C P stop ivf, follow chem , push pos, mobilize     LOS: 15 days   Jeneen Rinks Rishon Thilges 11/29/2017,3:09 PM

## 2017-11-30 LAB — RENAL FUNCTION PANEL
ANION GAP: 7 (ref 5–15)
Albumin: 2.5 g/dL — ABNORMAL LOW (ref 3.5–5.0)
BUN: 64 mg/dL — ABNORMAL HIGH (ref 6–20)
CO2: 19 mmol/L — AB (ref 22–32)
Calcium: 8.6 mg/dL — ABNORMAL LOW (ref 8.9–10.3)
Chloride: 113 mmol/L — ABNORMAL HIGH (ref 101–111)
Creatinine, Ser: 5.44 mg/dL — ABNORMAL HIGH (ref 0.61–1.24)
GFR calc Af Amer: 12 mL/min — ABNORMAL LOW (ref >60)
GFR calc non Af Amer: 11 mL/min — ABNORMAL LOW (ref >60)
GLUCOSE: 86 mg/dL (ref 65–99)
POTASSIUM: 4.6 mmol/L (ref 3.5–5.1)
Phosphorus: 4.9 mg/dL — ABNORMAL HIGH (ref 2.5–4.6)
Sodium: 139 mmol/L (ref 135–145)

## 2017-11-30 LAB — CULTURE, BLOOD (ROUTINE X 2)
Culture: NO GROWTH
Culture: NO GROWTH
Special Requests: ADEQUATE
Special Requests: ADEQUATE

## 2017-11-30 LAB — CBC
HEMATOCRIT: 25.9 % — AB (ref 39.0–52.0)
HEMOGLOBIN: 8.6 g/dL — AB (ref 13.0–17.0)
MCH: 26.1 pg (ref 26.0–34.0)
MCHC: 33.2 g/dL (ref 30.0–36.0)
MCV: 78.5 fL (ref 78.0–100.0)
Platelets: 332 10*3/uL (ref 150–400)
RBC: 3.3 MIL/uL — AB (ref 4.22–5.81)
RDW: 17.3 % — ABNORMAL HIGH (ref 11.5–15.5)
WBC: 13.1 10*3/uL — ABNORMAL HIGH (ref 4.0–10.5)

## 2017-11-30 MED ORDER — SODIUM BICARBONATE 650 MG PO TABS
1300.0000 mg | ORAL_TABLET | Freq: Two times a day (BID) | ORAL | Status: DC
  Administered 2017-11-30 – 2017-12-02 (×5): 1300 mg via ORAL
  Filled 2017-11-30 (×5): qty 2

## 2017-11-30 NOTE — Progress Notes (Signed)
Subjective/Chief Complaint:   1 - Bilateral Hydronephrosis / Acute Renal Failure - Cr 17 by PCP labs on eval malaize 11/2017. Non-con CT with hydro to level of thickened bladder. Bilateral neph tubes placed 11/2017. Foley removed 5/30.  2 - Bladder Thickening / Retroperitoneal Lymphadenopathy / Rule Out Primary Bladder or Other GU Cancer - sigificant bladder trigone thickening by CT 11/2017. Denies h/o gross hematuria but microhematuria noted x several.   OR cysto, bladder biopsy, retrogrades, prostate biopsy, attempt bilateral stents pending for 12/08/17  3 - Extremely Elevated PSA / Likely Metastatic Prostate Cancer - Pt's grandfather with prostate cancer. PSA 11/2017 - 787 (extreme elevation). Prostate BX scheduled and pending.   4 - Fever - new low grade fevers noted 5/24 this admission. Lenna Gilford 5/18 negative. New BCX, [place] 5/25 negative and resolved after short empiric cefepime course.    Today "Devon Cook" is  Feeling a bit stronger. Less malaise, wants to go home.   Objective: Vital signs in last 24 hours: Temp:  [98.4 F (36.9 C)-99.5 F (37.5 C)] 98.9 F (37.2 C) (05/30 1245) Pulse Rate:  [71-72] 72 (05/30 1245) Resp:  [18] 18 (05/30 1245) BP: (132-154)/(73-101) 150/101 (05/30 1245) SpO2:  [98 %-100 %] 98 % (05/30 1245) Weight:  [137.2 kg (302 lb 6.4 oz)] 137.2 kg (302 lb 6.4 oz) (05/30 0500) Last BM Date: 11/29/17  Intake/Output from previous day: 05/29 0701 - 05/30 0700 In: 1011.7 [P.O.:500; I.V.:511.7] Out: 4450 [Urine:4450] Intake/Output this shift: Total I/O In: 310 [P.O.:300; Other:10] Out: 3150 [Urine:3150]  General appearance: alert, cooperative, appears stated age and very pleasant. Son at bedside today.  Eyes: negative Nose: Nares normal. Septum midline. Mucosa normal. No drainage or sinus tenderness. Throat: lips, mucosa, and tongue normal; teeth and gums normal Neck: supple, symmetrical, trachea midline Back: symmetric, no curvature. ROM normal. No CVA  tenderness., neph tubes in place with copious urine that is thin and some blood tinged.  Resp: non-labored on room air.  Cardio: Nl rate Male genitalia: normal, foley in situ with scant light pink urine, removed.  Extremities: extremities normal, atraumatic, no cyanosis or edema Pulses: 2+ and symmetric Skin: Skin color, texture, turgor normal. No rashes or lesions Lymph nodes: Cervical, supraclavicular, and axillary nodes normal. Neurologic: Grossly normal   Lab Results:  Recent Labs    11/28/17 0446 11/30/17 0654  WBC 14.6* 13.1*  HGB 7.9* 8.6*  HCT 24.1* 25.9*  PLT 288 332   BMET Recent Labs    11/29/17 0438 11/30/17 0654  NA 138 139  K 4.6 4.6  CL 109 113*  CO2 20* 19*  GLUCOSE 99 86  BUN 59* 64*  CREATININE 5.69* 5.44*  CALCIUM 8.4* 8.6*   PT/INR No results for input(s): LABPROT, INR in the last 72 hours. ABG No results for input(s): PHART, HCO3 in the last 72 hours.  Invalid input(s): PCO2, PO2  Studies/Results: No results found.  Anti-infectives: Anti-infectives (From admission, onward)   Start     Dose/Rate Route Frequency Ordered Stop   11/25/17 0830  ceFEPIme (MAXIPIME) 1 g in sodium chloride 0.9 % 100 mL IVPB  Status:  Discontinued     1 g 200 mL/hr over 30 Minutes Intravenous Every 24 hours 11/25/17 0822 11/29/17 1042   11/16/17 0800  cefTRIAXone (ROCEPHIN) 2 g in sodium chloride 0.9 % 100 mL IVPB     2 g 200 mL/hr over 30 Minutes Intravenous To Radiology 11/15/17 1022 11/17/17 0800      Assessment/Plan:  1 -  Bilateral Hydronephrosis / Acute Renal Failure - agree with neph tubes for now for likely severe GU level obstruction.  He likely will have permanent severe GFR compromise from this insult. This appears due to likley locally advanced stage 4 prostate cancer. Foley out today as he had no bother from LUTS pre-admission and Cr has levelled off.   Continue to greatly appreciate nephrol management.   2 - Bladder Thickening / Retroperitoneal  Lymphadenopathy / Rule Out Primary Bladder or Other GU Cancer - discussed with pt plan for OR cysto / retrogrades / bladder and biopsy in elective setting once over acute metabolic deragnement.  This is scheduled for 6/7 as of now. Questions about peri-op course discussed.   3 - Extremely Elevated PSA / Likely Metastatic Prostate Cancer -  Pt with understanding of likely DX, prognosis, goals of care including androgen deprivatino after DX confrimed.   4 - Fevers - mostly resolved and no CX to guide further therapy.  PT ok for DC at anypoint from GU perspective. He has FU with [place] arranged.   [facility], Adda Stokes 11/30/2017

## 2017-11-30 NOTE — Progress Notes (Signed)
   Nutrition Brief Note  Patient identified due to length of stay  Wt Readings from Last 15 Encounters:  11/30/17 (!) 302 lb 6.4 oz (137.2 kg)  11/13/17 (!) 307 lb 3.2 oz (139.3 kg)  10/10/17 (!) 301 lb 6.4 oz (136.7 kg)  09/25/17 (!) 301 lb (136.5 kg)  09/12/16 (!) 327 lb 6.4 oz (148.5 kg)  11/18/15 (!) 324 lb (147 kg)  09/07/15 (!) 320 lb (145.2 kg)  04/14/14 (!) 312 lb (141.5 kg)  11/27/13 (!) 320 lb (145.2 kg)  07/15/13 (!) 315 lb (142.9 kg)  01/09/12 (!) 310 lb (140.6 kg)  07/26/10 (!) 307 lb (139.3 kg)  11/22/10 (!) 304 lb (137.9 kg)   Spoke with Mr. Naugle at bedside. He states that for the past 3-4 months he has been trying to find and eat foods that taste ok to him due to taste changes. 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 Otherwise, patient consumes snacks or fast food. Reports a UBW of 295-307 and recent weight loss. Per chart his weight has been relatively stable this year. He does exhibit a 25 pound/7.6% insignificant weight loss over 1 year. Patient has no complaints about PO intake at this time.   Body mass index is 41.01 kg/m. Patient meets criteria for obese class III based on current BMI.   Current diet order is renal, patient is consuming approximately 100%% of meals at this time. Labs and medications reviewed.   No nutrition interventions warranted at this time. If nutrition issues arise, please consult RD.   Satira Anis. Aliana Kreischer, MS, RD LDN Inpatient Clinical Dietitian Pager [phone]

## 2017-11-30 NOTE — Progress Notes (Signed)
Patient has ACTH stimulation test order for 0530. RN called phlebotomy and they said they do not collect these specific labs during morning collection and that the this lab would have to be rescheduled for 8 or 9 o'clock in the morning on 5/31. Shirley,MD of [facility] notified of situation and said it's okay to move the lab back to 8am in the morning of 5/31. Will continue to monitor and treat per MD orders.

## 2017-11-30 NOTE — Progress Notes (Signed)
Subjective: Interval History: has no complaint . Concern about tubes but ok..  Objective: Vital signs in last 24 hours: Temp:  [98.2 F (36.8 C)-99.5 F (37.5 C)] 98.4 F (36.9 C) (05/30 0402) Pulse Rate:  [71-89] 72 (05/30 0402) Resp:  [14-18] 18 (05/30 0402) BP: (130-154)/(73-93) 154/93 (05/30 0402) SpO2:  [98 %-100 %] 100 % (05/30 0402) Weight:  [137.2 kg (302 lb 6.4 oz)] 137.2 kg (302 lb 6.4 oz) (05/30 0500) Weight change: 2.041 kg (4 lb 8 oz)  Intake/Output from previous day: 05/29 0701 - 05/30 0700 In: 1011.7 [P.O.:500; I.V.:511.7] Out: 4450 [Urine:4450] Intake/Output this shift: Total I/O In: -  Out: 1550 [Urine:1550]  General appearance: alert, cooperative, no distress and moderately obese Resp: clear to auscultation bilaterally Cardio: S1, S2 normal and systolic murmur: systolic ejection 2/6, decrescendo at 2nd left intercostal space GI: obese, pos bs, liver down 5 cm Extremities: edema Tr-1+  Lab Results: Recent Labs    11/28/17 0446 11/30/17 0654  WBC 14.6* 13.1*  HGB 7.9* 8.6*  HCT 24.1* 25.9*  PLT 288 332   BMET:  Recent Labs    11/29/17 0438 11/30/17 0654  NA 138 139  K 4.6 4.6  CL 109 113*  CO2 20* 19*  GLUCOSE 99 86  BUN 59* 64*  CREATININE 5.69* 5.44*  CALCIUM 8.4* 8.6*   No results for input(s): PTH in the last 72 hours. Iron Studies: No results for input(s): IRON, TIBC, TRANSFERRIN, FERRITIN in the last 72 hours.  Studies/Results: No results found.  I have reviewed the patient's current medications.  Assessment/Plan: 1 AKI obstructive uropathy. Slowly better, not clear how much will get back as has been obstructed for a while and has parenchymal damage. Mild acidemia add Na bicarb.   2 Prostate Ca with mets/obstruction  Per urology 3 Anemia esa  4 gout Allopurinol 5 Obesity P Na bicarb, follow chem. From renal standpoint, can f/u with Dr. Lorrene Reid as outpatient. Primary issue is urologic   LOS: 16 days   Numan Zylstra 11/30/2017,9:38 AM

## 2017-11-30 NOTE — Progress Notes (Signed)
PROGRESS NOTE    Devon Cook  BUL:[id number] DOB: 1961/01/04 DOA: 11/14/2017 PCP: [name], MD    Brief Narrative: :57 year old male with history of hypertension and hep C admitted with malaise and difficulty voiding for 1-2 months, saw  PCP was treated with couple of rounds of antibiotics, had labs drawn which revealed a creatinine of 19 and was referred to the emergency room -CT revealed bilateral hydronephrosis at the level of the bladder with diffuse bladder wall thickening, bulky retroperitoneal lymphadenopathy -urology consulted, IR consulted, status post bilateral percutaneous nephrostomy tubes 5/15  -kidney function improving slowly, Renal and Urology following -plan for Outpatient workup for suspected metastatic prostate CA   Assessment & Plan:   AKI;  -Postobstructive primarily, bilateral hydronephrosis, Metastatic Prostate CA suspected  -Urology consulted, Renal and IR consulted, creatinine peaked at 19 -status post bilateral nephrostomy tubes 5/15  -creatinine slowly trending down from peak of 19 down to 5.4 today -nephrology following -good Urine output now, DC foley -needs close FU with Renal and Urology  Fever, Leukocytosis;  He denies cough, abdominal symptoms, diarrhea, rash etc.  pan culture; Blood culture, no growth to date UA, urine culture 10 K , chest x ray negative  -I suspect this was secondary to recurrent gout flare, requiring additional doses of prednisone on 5/27 and 5/28 -Infectious workup negative -stopped cefepime, monitor   Bilateral severe hydronephrosis, bladder wall thickening and bulky retroperitoneal lymphadenopathy, status post bilateral nephrostomies as listed above Concerning with metastatic prostate CA, very high PSA  -Urology recommends out patient cystoscopy and biopsy./  Patient aware of consideration and prognosis.   -plan for cystoscopy with bladder biopsy, prostate biopsy, attempted bilateral stents 46/7 by Dr. Tresa Moore -DC  foley  Anemia;  -due to chronic disease from malignancy, acute illness, hemodilution -patient also gives history of melena/black stools for few days prior to admission, was Hemoccult positive in the emergency room, GI consulted,  plan to defer GI workup at this time due to multitude of other active medical problems, no further melena -patient received couple of units of blood to admission and subsequently 1 unit on 5/26 and another on 5/27 -Hemoglobin stable now Received  IV iron, aranesp.   Gouty Arthritis;  -improved after another round of prednisone -Level is 14.5 admission -continue low-dose allopurinol  History of hepatitis C  status post Harvoni Rx   DVT prophylaxis: heparin  Code Status: full code.  Family Communication: care discussed with patient and wife Disposition Plan: home when renal function improved.    Consultants:  Nephrology  Urology  IR   Procedures: Bilateral PCNs 5/15    Antimicrobials: none  Subjective: -feels good, no complaints  Objective: Vitals:   11/29/17 2155 11/30/17 0402 11/30/17 0500 11/30/17 1245  BP: 132/73 (!) 154/93  (!) 150/101  Pulse: 71 72  72  Resp: 18 18  18   Temp: 99.5 F (37.5 C) 98.4 F (36.9 C)  98.9 F (37.2 C)  TempSrc: Oral Oral  Oral  SpO2:  100%  98%  Weight:   (!) 137.2 kg (302 lb 6.4 oz)   Height:        Intake/Output Summary (Last 24 hours) at 11/30/2017 1408 Last data filed at 11/30/2017 1100 Gross per 24 hour  Intake 310 ml  Output 5350 ml  Net -5040 ml   Filed Weights   11/28/17 2022 11/29/17 0540 11/30/17 0500  Weight: 136 kg (299 lb 14.4 oz) 136 kg (299 lb 14.4 oz) (!) 137.2 kg (302 lb  6.4 oz)    Examination:  Gen: Awake, Alert, Oriented X 3,  HEENT: PERRLA, Neck supple, no JVD Lungs: Good air movement bilaterally, CTAB CVS: RRR,No Gallops,Rubs or new Murmurs Abd: soft, Non tender, non distended, BS present Extremities: No Cyanosis, Clubbing or edema Skin: no new rashes   Data  Reviewed: I have personally reviewed following labs and imaging studies  CBC: Recent Labs  Lab 11/25/17 0404 11/26/17 0319 11/27/17 0133 11/27/17 1645 11/28/17 0446 11/30/17 0654  WBC 20.1* 17.9* 13.8*  --  14.6* 13.1*  HGB 7.1* 7.0* 7.5* 8.5* 7.9* 8.6*  HCT 21.7* 21.5* 23.0* 26.3* 24.1* 25.9*  MCV 77.8* 77.6* 78.0  --  78.2 78.5  PLT 272 272 274  --  288 267   Basic Metabolic Panel: Recent Labs  Lab 11/26/17 0319 11/27/17 0133 11/28/17 0446 11/29/17 0438 11/30/17 0654  NA 135 139 138 138 139  K 3.7 4.3 4.5 4.6 4.6  CL 103 106 106 109 113*  CO2 22 22 21* 20* 19*  GLUCOSE 130* 104* 130* 99 86  BUN 62* 63* 61* 59* 64*  CREATININE 7.25* 7.24* 6.36* 5.69* 5.44*  CALCIUM 8.5* 8.4* 8.5* 8.4* 8.6*  PHOS 4.0 4.2 3.5 3.5 4.9*   GFR: Estimated Creatinine Clearance: 21.7 mL/min (A) (by C-G formula based on SCr of 5.44 mg/dL (H)). Liver Function Tests: Recent Labs  Lab 11/26/17 0319 11/27/17 0133 11/28/17 0446 11/29/17 0438 11/30/17 0654  ALBUMIN 2.5* 2.4* 2.3* 2.4* 2.5*   No results for input(s): LIPASE, AMYLASE in the last 168 hours. No results for input(s): AMMONIA in the last 168 hours. Coagulation Profile: No results for input(s): INR, PROTIME in the last 168 hours. Cardiac Enzymes: No results for input(s): CKTOTAL, CKMB, CKMBINDEX, TROPONINI in the last 168 hours. BNP (last 3 results) No results for input(s): PROBNP in the last 8760 hours. HbA1C: No results for input(s): HGBA1C in the last 72 hours. CBG: No results for input(s): GLUCAP in the last 168 hours. Lipid Profile: No results for input(s): CHOL, HDL, LDLCALC, TRIG, CHOLHDL, LDLDIRECT in the last 72 hours. Thyroid Function Tests: No results for input(s): TSH, T4TOTAL, FREET4, T3FREE, THYROIDAB in the last 72 hours. Anemia Panel: No results for input(s): VITAMINB12, FOLATE, FERRITIN, TIBC, IRON, RETICCTPCT in the last 72 hours. Sepsis Labs: No results for input(s): PROCALCITON, LATICACIDVEN in the  last 168 hours.  Recent Results (from the past 240 hour(s))  Urine Culture     Status: Abnormal   Collection Time: 11/25/17  7:55 AM  Result Value Ref Range Status   Specimen Description URINE, CLEAN CATCH  Final   Special Requests NONE  Final   Culture (A)  Final    <10,000 COLONIES/mL INSIGNIFICANT GROWTH Performed at [facility], [address], [place], [place] [address]    Report Status 11/26/2017 FINAL  Final  Culture, blood (routine x 2)     Status: None (Preliminary result)   Collection Time: 11/25/17  9:02 AM  Result Value Ref Range Status   Specimen Description BLOOD LEFT HAND  Final   Special Requests   Final    BOTTLES DRAWN AEROBIC AND ANAEROBIC Blood Culture adequate volume   Culture   Final    NO GROWTH 4 DAYS Performed at [facility], [place] [address] [place],  [address]    Report Status PENDING  Incomplete  Culture, blood (routine x 2)     Status: None (Preliminary result)   Collection Time: 11/25/17  9:02 AM  Result Value Ref Range Status   Specimen Description BLOOD LEFT HAND  Final   Special Requests   Final    BOTTLES DRAWN AEROBIC AND ANAEROBIC Blood Culture adequate volume   Culture   Final    NO GROWTH 4 DAYS Performed at [facility], [address] [place], [place] [address]    Report Status PENDING  Incomplete         Radiology Studies: No results found.      Scheduled Meds: . allopurinol  100 mg Oral Daily  . darbepoetin (ARANESP) injection - NON-DIALYSIS  200 mcg Subcutaneous Q Tue-1800  . heparin injection (subcutaneous)  5,000 Units Subcutaneous Q8H  . multivitamin with minerals  1 tablet Oral Daily  . pantoprazole  40 mg Oral BID  . sodium bicarbonate  1,300 mg Oral BID  . sodium chloride flush  5 mL Intracatheter Q8H   Continuous Infusions:    LOS: 16 days    Time spent: 25 minutes.     Domenic Polite, MD [facility] Page via [email or web address] password [email or web address]  If 7PM-7AM, please contact  night-coverage  11/30/2017, 2:08 PM

## 2017-12-01 LAB — RENAL FUNCTION PANEL
Albumin: 2.6 g/dL — ABNORMAL LOW (ref 3.5–5.0)
Anion gap: 12 (ref 5–15)
BUN: 62 mg/dL — AB (ref 6–20)
CHLORIDE: 103 mmol/L (ref 101–111)
CO2: 19 mmol/L — ABNORMAL LOW (ref 22–32)
CREATININE: 5.25 mg/dL — AB (ref 0.61–1.24)
Calcium: 8.8 mg/dL — ABNORMAL LOW (ref 8.9–10.3)
GFR calc Af Amer: 13 mL/min — ABNORMAL LOW (ref >60)
GFR, EST NON AFRICAN AMERICAN: 11 mL/min — AB (ref >60)
Glucose, Bld: 107 mg/dL — ABNORMAL HIGH (ref 65–99)
POTASSIUM: 4.4 mmol/L (ref 3.5–5.1)
Phosphorus: 4.2 mg/dL (ref 2.5–4.6)
Sodium: 134 mmol/L — ABNORMAL LOW (ref 135–145)

## 2017-12-01 MED ORDER — CEFAZOLIN SODIUM-DEXTROSE 1-4 GM/50ML-% IV SOLN: 1.0000 g | INTRAVENOUS | Status: DC

## 2017-12-01 MED ORDER — FLEET ENEMA 7-19 GM/118ML RE ENEM: 1.0000 | ENEMA | Freq: Once | RECTAL | Status: DC

## 2017-12-01 NOTE — Care Management Note (Signed)
Case Management Note  Patient Details  Name: Devon Cook MRN: [id number] Date of Birth: 11-Dec-1960  Subjective/Objective:                  Admitted with ARI, anemia/ quaic positive stool, and found to have  bilateral hydronephrosis/ ? Bladder CA and LLE DVT. Hx of prostatitis, hepatitis , hypertension. From home with family. PTA independent with ADL's , no DME usage.  PCP: Jill Alexanders    5/15 S/P placement of bilateral  [facility]     Hospital course .Marland Kitchen... Pt with Extremely Elevated PSA / Likely Metastatic Prostate Cancer....urology following.    Action/Plan: Transition to home with home health services when medically stable.  Expected Discharge Date:                  Expected Discharge Plan:  Home/Self Care  In-House Referral:     Discharge planning Services     Post Acute Care Choice:    Choice offered to:     DME Arranged:   N/A  DME Agency:   N/A  HH Arranged:   RN HH Agency:   [facility]  Status of Service:  In process, will continue to follow  If discussed at Long Length of Stay Meetings, dates discussed:    Additional Comments:  Sharin Mons, RN 12/01/2017, 5:21 PM

## 2017-12-01 NOTE — Progress Notes (Signed)
S:Feel well no complaints O:BP 139/81 (BP Location: Left Arm)   Pulse 79   Temp 98.1 F (36.7 C) (Oral)   Resp 18   Ht 6' (1.829 m)   Wt 133.2 kg (293 lb 10.4 oz)   SpO2 100%   BMI 39.83 kg/m   Intake/Output Summary (Last 24 hours) at 12/01/2017 1127 Last data filed at 12/01/2017 0845 Gross per 24 hour  Intake 210 ml  Output 4601 ml  Net -4391 ml   Intake/Output: I/O last 3 completed shifts: In: 520 [P.O.:500; Other:20] Out: 3762 [GBTDV:7616; Stool:1]  Intake/Output this shift:  Total I/O In: -  Out: 725 [Urine:725] Weight change: -3.968 kg (-8 lb 12 oz) Gen: NAD CVS: no rub Resp: cta Abd: benign Ext: trace edema  Recent Labs  Lab 11/25/17 0404 11/26/17 0319 11/27/17 0133 11/28/17 0446 11/29/17 0438 11/30/17 0654 12/01/17 0507  NA 139 135 139 138 138 139 134*  K 3.7 3.7 4.3 4.5 4.6 4.6 4.4  CL 103 103 106 106 109 113* 103  CO2 25 22 22  21* 20* 19* 19*  GLUCOSE 122* 130* 104* 130* 99 86 107*  BUN 69* 62* 63* 61* 59* 64* 62*  CREATININE 7.48* 7.25* 7.24* 6.36* 5.69* 5.44* 5.25*  ALBUMIN 2.5* 2.5* 2.4* 2.3* 2.4* 2.5* 2.6*  CALCIUM 8.9 8.5* 8.4* 8.5* 8.4* 8.6* 8.8*  PHOS 3.9 4.0 4.2 3.5 3.5 4.9* 4.2   Liver Function Tests: Recent Labs  Lab 11/29/17 0438 11/30/17 0654 12/01/17 0507  ALBUMIN 2.4* 2.5* 2.6*   No results for input(s): LIPASE, AMYLASE in the last 168 hours. No results for input(s): AMMONIA in the last 168 hours. CBC: Recent Labs  Lab 11/25/17 0404 11/26/17 0319 11/27/17 0133 11/27/17 1645 11/28/17 0446 11/30/17 0654  WBC 20.1* 17.9* 13.8*  --  14.6* 13.1*  HGB 7.1* 7.0* 7.5* 8.5* 7.9* 8.6*  HCT 21.7* 21.5* 23.0* 26.3* 24.1* 25.9*  MCV 77.8* 77.6* 78.0  --  78.2 78.5  PLT 272 272 274  --  288 332   Cardiac Enzymes: No results for input(s): CKTOTAL, CKMB, CKMBINDEX, TROPONINI in the last 168 hours. CBG: No results for input(s): GLUCAP in the last 168 hours.  Iron Studies: No results for input(s): IRON, TIBC, TRANSFERRIN, FERRITIN  in the last 72 hours. Studies/Results: No results found. Marland Kitchen allopurinol  100 mg Oral Daily  . darbepoetin (ARANESP) injection - NON-DIALYSIS  200 mcg Subcutaneous Q Tue-1800  . heparin injection (subcutaneous)  5,000 Units Subcutaneous Q8H  . multivitamin with minerals  1 tablet Oral Daily  . pantoprazole  40 mg Oral BID  . sodium bicarbonate  1,300 mg Oral BID  . sodium chloride flush  5 mL Intracatheter Q8H  . sodium phosphate  1 enema Rectal Once    BMET    Component Value Date/Time   NA 134 (L) 12/01/2017 0507   NA 140 11/13/2017 0959   K 4.4 12/01/2017 0507   CL 103 12/01/2017 0507   CO2 19 (L) 12/01/2017 0507   GLUCOSE 107 (H) 12/01/2017 0507   BUN 62 (H) 12/01/2017 0507   BUN 125 (HH) 11/13/2017 0959   CREATININE 5.25 (H) 12/01/2017 0507   CREATININE 0.87 09/07/2015 0001   CALCIUM 8.8 (L) 12/01/2017 0507   CALCIUM 8.9 11/15/2017 0022   GFRNONAA 11 (L) 12/01/2017 0507   GFRAA 13 (L) 12/01/2017 0507   CBC    Component Value Date/Time   WBC 13.1 (H) 11/30/2017 0654   RBC 3.30 (L) 11/30/2017 0654   HGB  8.6 (L) 11/30/2017 0654   HGB 6.8 (LL) 11/13/2017 0959   HCT 25.9 (L) 11/30/2017 0654   HCT 20.2 (L) 11/13/2017 0959   PLT 332 11/30/2017 0654   PLT 253 11/13/2017 0959   MCV 78.5 11/30/2017 0654   MCV 73 (L) 11/13/2017 0959   MCH 26.1 11/30/2017 0654   MCHC 33.2 11/30/2017 0654   RDW 17.3 (H) 11/30/2017 0654   RDW 14.5 11/13/2017 0959   LYMPHSABS 1.5 11/15/2017 0022   LYMPHSABS 1.7 11/13/2017 0959   MONOABS 1.7 (H) 11/15/2017 0022   EOSABS 0.2 11/15/2017 0022   EOSABS 0.2 11/13/2017 0959   BASOSABS 0.0 11/15/2017 0022   BASOSABS 0.0 11/13/2017 0959     Assessment/Plan:  1. AKI- in setting of obstructive uropathy.  No known baseline Cr prior to admission on 11/15/17.  Creatinine peaked at 19.1 on 11/16/17 and has continued to slowly improve to 5.25.  Urology is following and will need to f/u with our office after discharge but expect creatinine to continue to  improve if he had normal renal function prior to his obstruction.  2. Obstructive uropathy- bilateral hydronephrosis s/p bilateral PNT's.  Elevated psa of 787, concerning for prostate cancer.  Urology following 3. Anemia- s/p transfusion x 3 units and given aranesp but need to discuss with Urology/oncology about aranesp and possible malignancy. 4. Disposition- will sign off and arrange for f/u in our office with Dr. Lorrene Reid in the next 3-4 weeks.  Please call with any questions or concerns.  Donetta Potts, MD [employer] [phone]

## 2017-12-01 NOTE — Progress Notes (Signed)
PROGRESS NOTE    Devon Cook  SWF:[id number] DOB: [birth year] DOA: 11/14/2017 PCP: [name], MD    Brief Narrative: :57 year old male with history of hypertension and hep C admitted with malaise and difficulty voiding for 1-2 months, saw  PCP was treated with couple of rounds of antibiotics, had labs drawn which revealed a creatinine of 19 and was referred to the emergency room -CT revealed bilateral hydronephrosis at the level of the bladder with diffuse bladder wall thickening, bulky retroperitoneal lymphadenopathy -urology consulted, IR consulted, status post bilateral percutaneous nephrostomy tubes 5/15  -kidney function improving slowly, Renal and Urology following -plan for Outpatient workup for suspected metastatic prostate CA   Assessment & Plan:   AKI/Subacute obstrcution -Postobstructive primarily, bilateral hydronephrosis, Metastatic Prostate CA suspected  -Urology, Renal and IR consulted, creatinine peaked at 19 -status post bilateral nephrostomy tubes 5/15  -creatinine slowly trending down from peak of 19 down to 5.2 today -nephrology following, continue PO intake and Oral bicarb -good Urine output now,  Foley removed -needs close FU with Renal and Urology -has Surgery [facility] 6/7  Fever, Leukocytosis;  -pan culture; Blood culture, no growth to date UA, urine culture 10 K , chest x ray negative  -I suspect this was secondary to recurrent gout flare, requiring additional doses of prednisone on 5/27 and 5/28 -off abx, stable, monitor  Suspected metastatic Prostate CA with bilateral severe hydronephrosis, bladder wall thickening and bulky retroperitoneal lymphadenopathy, - status post bilateral nephrostomies as listed above - very high PSA  -Urology recommends out patient cystoscopy and biopsy./  Patient aware of consideration and prognosis.   -plan for cystoscopy with bladder biopsy, prostate biopsy, attempted bilateral stents 46/7 by Dr.  Tresa Moore -DC foley  Anemia;  -due to chronic disease from malignancy, acute illness, hemodilution -patient also gives history of melena/black stools for few days prior to admission, was Hemoccult positive in the emergency room, GI consulted,  plan to defer GI workup at this time due to multitude of other active medical problems, no further melena -patient received couple of units of blood to admission and subsequently 1 unit on 5/26 and another on 5/27 -Hemoglobin stable now Received  IV iron, aranesp.   Gouty Arthritis;  -improved after another round of prednisone -Level is 14.5 admission -continue low-dose allopurinol  History of hepatitis C  status post Harvoni Rx   DVT prophylaxis: heparin  Code Status: full code.  Family Communication: care discussed with patient and wife Disposition Plan: home in 1-2days   Consultants:  Nephrology  Urology  IR   Procedures: Bilateral PCNs 5/15    Antimicrobials: none  Subjective: -continues to feel better, joints ok yes  Objective: Vitals:   11/30/17 1245 11/30/17 2029 12/01/17 0529 12/01/17 0529  BP: (!) 150/101 117/67  139/81  Pulse: 72 78  79  Resp: 18 18  18   Temp: 98.9 F (37.2 C) 97.6 F (36.4 C)  98.1 F (36.7 C)  TempSrc: Oral Oral  Oral  SpO2: 98% 100%  100%  Weight:   133.2 kg (293 lb 10.4 oz)   Height:        Intake/Output Summary (Last 24 hours) at 12/01/2017 1457 Last data filed at 12/01/2017 1434 Gross per 24 hour  Intake 220 ml  Output 5026 ml  Net -4806 ml   Filed Weights   11/29/17 0540 11/30/17 0500 12/01/17 0529  Weight: 136 kg (299 lb 14.4 oz) (!) 137.2 kg (302 lb 6.4 oz) 133.2 kg (293  lb 10.4 oz)    Examination:  Gen: Awake, Alert, Oriented X 3,  HEENT: PERRLA, Neck supple, no JVD Lungs: Good air movement bilaterally, CTAB CVS: RRR,No Gallops,Rubs or new Murmurs Abd: soft, Non tender, non distended, BS present, bilateral nephrostomies with clear urine Extremities: No Cyanosis, Clubbing  or edema Skin: no new rashes    Data Reviewed: I have personally reviewed following labs and imaging studies  CBC: Recent Labs  Lab 11/25/17 0404 11/26/17 0319 11/27/17 0133 11/27/17 1645 11/28/17 0446 11/30/17 0654  WBC 20.1* 17.9* 13.8*  --  14.6* 13.1*  HGB 7.1* 7.0* 7.5* 8.5* 7.9* 8.6*  HCT 21.7* 21.5* 23.0* 26.3* 24.1* 25.9*  MCV 77.8* 77.6* 78.0  --  78.2 78.5  PLT 272 272 274  --  288 373   Basic Metabolic Panel: Recent Labs  Lab 11/27/17 0133 11/28/17 0446 11/29/17 0438 11/30/17 0654 12/01/17 0507  NA 139 138 138 139 134*  K 4.3 4.5 4.6 4.6 4.4  CL 106 106 109 113* 103  CO2 22 21* 20* 19* 19*  GLUCOSE 104* 130* 99 86 107*  BUN 63* 61* 59* 64* 62*  CREATININE 7.24* 6.36* 5.69* 5.44* 5.25*  CALCIUM 8.4* 8.5* 8.4* 8.6* 8.8*  PHOS 4.2 3.5 3.5 4.9* 4.2   GFR: Estimated Creatinine Clearance: 22.2 mL/min (A) (by C-G formula based on SCr of 5.25 mg/dL (H)). Liver Function Tests: Recent Labs  Lab 11/27/17 0133 11/28/17 0446 11/29/17 0438 11/30/17 0654 12/01/17 0507  ALBUMIN 2.4* 2.3* 2.4* 2.5* 2.6*   No results for input(s): LIPASE, AMYLASE in the last 168 hours. No results for input(s): AMMONIA in the last 168 hours. Coagulation Profile: No results for input(s): INR, PROTIME in the last 168 hours. Cardiac Enzymes: No results for input(s): CKTOTAL, CKMB, CKMBINDEX, TROPONINI in the last 168 hours. BNP (last 3 results) No results for input(s): PROBNP in the last 8760 hours. HbA1C: No results for input(s): HGBA1C in the last 72 hours. CBG: No results for input(s): GLUCAP in the last 168 hours. Lipid Profile: No results for input(s): CHOL, HDL, LDLCALC, TRIG, CHOLHDL, LDLDIRECT in the last 72 hours. Thyroid Function Tests: No results for input(s): TSH, T4TOTAL, FREET4, T3FREE, THYROIDAB in the last 72 hours. Anemia Panel: No results for input(s): VITAMINB12, FOLATE, FERRITIN, TIBC, IRON, RETICCTPCT in the last 72 hours. Sepsis Labs: No results for  input(s): PROCALCITON, LATICACIDVEN in the last 168 hours.  Recent Results (from the past 240 hour(s))  Urine Culture     Status: Abnormal   Collection Time: 11/25/17  7:55 AM  Result Value Ref Range Status   Specimen Description URINE, CLEAN CATCH  Final   Special Requests NONE  Final   Culture (A)  Final    <10,000 COLONIES/mL INSIGNIFICANT GROWTH Performed at [facility], [address] [place], [place] [address]    Report Status 11/26/2017 FINAL  Final  Culture, blood (routine x 2)     Status: None   Collection Time: 11/25/17  9:02 AM  Result Value Ref Range Status   Specimen Description BLOOD LEFT HAND  Final   Special Requests   Final    BOTTLES DRAWN AEROBIC AND ANAEROBIC Blood Culture adequate volume   Culture   Final    NO GROWTH 5 DAYS Performed at [facility], [place] [address], [place], [place] [address]    Report Status 11/30/2017 FINAL  Final  Culture, blood (routine x 2)     Status: None   Collection Time: 11/25/17  9:02 AM  Result Value Ref Range Status   Specimen Description BLOOD LEFT HAND  Final   Special Requests   Final    BOTTLES DRAWN AEROBIC AND ANAEROBIC Blood Culture adequate volume   Culture   Final    NO GROWTH 5 DAYS Performed at [facility], [address], [place], [place] [address]    Report Status 11/30/2017 FINAL  Final         Radiology Studies: No results found.      Scheduled Meds: . allopurinol  100 mg Oral Daily  . darbepoetin (ARANESP) injection - NON-DIALYSIS  200 mcg Subcutaneous Q Tue-1800  . heparin injection (subcutaneous)  5,000 Units Subcutaneous Q8H  . multivitamin with minerals  1 tablet Oral Daily  . pantoprazole  40 mg Oral BID  . sodium bicarbonate  1,300 mg Oral BID  . sodium chloride flush  5 mL Intracatheter Q8H  . sodium phosphate  1 enema Rectal Once   Continuous Infusions:    LOS: 17 days    Time spent: 25 minutes.     Domenic Polite, MD [facility] Page via  [email or web address] password [email or web address]  If 7PM-7AM, please contact night-coverage  12/01/2017, 2:57 PM

## 2017-12-02 DIAGNOSIS — R972 Elevated prostate specific antigen [PSA]: Secondary | ICD-10-CM

## 2017-12-02 DIAGNOSIS — C61 Malignant neoplasm of prostate: Secondary | ICD-10-CM | POA: Diagnosis present

## 2017-12-02 DIAGNOSIS — N400 Enlarged prostate without lower urinary tract symptoms: Secondary | ICD-10-CM

## 2017-12-02 LAB — BASIC METABOLIC PANEL
Anion gap: 11 (ref 5–15)
BUN: 63 mg/dL — AB (ref 6–20)
CHLORIDE: 107 mmol/L (ref 101–111)
CO2: 21 mmol/L — ABNORMAL LOW (ref 22–32)
Calcium: 9.2 mg/dL (ref 8.9–10.3)
Creatinine, Ser: 5.19 mg/dL — ABNORMAL HIGH (ref 0.61–1.24)
GFR calc Af Amer: 13 mL/min — ABNORMAL LOW (ref >60)
GFR calc non Af Amer: 11 mL/min — ABNORMAL LOW (ref >60)
GLUCOSE: 104 mg/dL — AB (ref 65–99)
POTASSIUM: 4.5 mmol/L (ref 3.5–5.1)
Sodium: 139 mmol/L (ref 135–145)

## 2017-12-02 MED ORDER — PANTOPRAZOLE SODIUM 40 MG PO TBEC: 40.0000 mg | DELAYED_RELEASE_TABLET | Freq: Every day | ORAL | 0 refills | Status: DC

## 2017-12-02 MED ORDER — ALLOPURINOL 100 MG PO TABS: 100.0000 mg | ORAL_TABLET | Freq: Every day | ORAL | 0 refills | Status: DC

## 2017-12-02 MED ORDER — ACETAMINOPHEN 325 MG PO TABS: 650.0000 mg | ORAL_TABLET | Freq: Four times a day (QID) | ORAL | Status: DC | PRN

## 2017-12-02 MED ORDER — SODIUM BICARBONATE 650 MG PO TABS
1300.0000 mg | ORAL_TABLET | Freq: Two times a day (BID) | ORAL | 1 refills | Status: DC
Start: 2017-12-02 — End: 2019-05-22

## 2017-12-02 NOTE — Discharge Summary (Signed)
Physician Discharge Summary  Devon Cook WHQ:[id number] DOB: July 10, 1960 DOA: 11/14/2017  PCP: Devon Lung, MD  Admit date: 11/14/2017 Discharge date: 12/02/2017  Time spent: 35 minutes  Recommendations for Outpatient Follow-up:  PCP Dr.Lalonde on Tuesday 6/4 with labs-[facility] Dr. Tresa Moore on 6/7 for cystoscopies, biopsies and stent [facility] Dr. Lorrene Reid in 3weeks, office to call patient with appointment Home health RN  Discharge Diagnoses:  Principal Problem:   ARF (acute renal failure) ([place])   Severe bilateral hydronephrosis   Elevated PSA, metastatic prostate cancer suspected   Metabolic acidosis   Hypertension   Morbid obesity with BMI of 40.0-44.9, adult ([place])   History of hepatitis C   GI bleed   Acute blood loss anemia   Elevated PSA   Discharge Condition:  improved  Diet recommendation:  Low sodium, advised to drink plenty of water to stay well-hydrated  Filed Weights   11/30/17 0500 12/01/17 0529 12/02/17 0826  Weight: (!) 137.2 kg (302 lb 6.4 oz) 133.2 kg (293 lb 10.4 oz) 131.7 kg (290 lb 4.8 oz)    History of present illness:  57 year old male with history of hypertension and hep C admitted with malaise and difficulty voiding for 1-2 months, saw PCP was treated with couple of rounds of antibiotics, had labs drawn which revealed a creatinine of 19and was referred to the emergency room -CTnoncontrast revealed bilateral hydronephrosis at the level of the bladder with diffuse bladder wall thickening, bulky retroperitoneal lymphadenopathy  Hospital Course:   AKI/Subacute obstrcution -Postobstructive primarily, Severe bilateral hydronephrosis, Metastatic Prostate CA suspected, suspect this was ongoing for weeks to at least a month prior to admission -Urology, Renal and IR consulted, creatinine peaked at 19, Lisinopril stopped -status post bilateral nephrostomy tubes 5/15  -creatinine slowly trending down from peak of 19 down to 5.1  today -nephrology following, now off IV fluids and advised good oral fluid intake and to continue Oral bicarb -good Urine output now,  Foley removed, he will continue to have a lateral nephrostomies at discharge, has a follow-up with his PCP early next week for repeat labs -Also scheduled to follow-up with urology Dr. Anselm Lis 6/7 for cystoscopy, bladder and prostate biopsies, possible stents -expect creatinine to slowly continued to improve however do not expect this to completely normalize, given chronicity of obstruction.  Suspected metastatic Prostate CA with bilateral severe hydronephrosis,bladder wall thickening and bulky retroperitoneal lymphadenopathy, - status post bilateral nephrostomies as listed above - very high PSA  -Urology consulted -plan for cystoscopy with bladder biopsy, prostate biopsy, attempted bilateral stents 6/7 by Dr. Tresa Moore  Anemia;  -due to chronic disease from malignancy, acute illness, hemodilution -patient also gives history of melena/black stools for few days prior to admission, was Hemoccult positive in the emergency room, GI consulted, plan to defer GI workup at this time due to multitude of other active medical problems, no further melena -patient received couple of units of blood to admission and subsequently 1 unit on 5/26 and another on 5/27 -Hemoglobin stable now -Received  IV iron, aranesp.   Gouty Arthritis;  -improved after a course of prednisone -Uric acid was 14.5 admission -started on low-dose allopurinol  History of hepatitis C  status post Harvoni Rx   Consultants:  Nephrology Dr.Dunham Urology Dr.Manny IR   Procedures: Bilateral PCNs 5/15     Discharge Exam: Vitals:   12/01/17 2113 12/02/17 0553  BP: 140/84 (!) 139/97  Pulse: 82 81  Resp: 18 18  Temp: 100.2 F (37.9 C) 98.3  F (36.8 C)  SpO2: 100% 97%    General: AAOx3 Cardiovascular: S1S2/RRR Respiratory: CTAB  Discharge Instructions   Discharge  Instructions    Diet - low sodium heart healthy   Complete by:  As directed    Increase activity slowly   Complete by:  As directed      Allergies as of 12/02/2017   No Known Allergies     Medication List    STOP taking these medications   ciprofloxacin 500 MG tablet Commonly known as:  CIPRO   Ledipasvir-Sofosbuvir 90-400 MG Tabs Commonly known as:  HARVONI   lisinopril-hydrochlorothiazide 20-25 MG tablet Commonly known as:  PRINZIDE,ZESTORETIC     TAKE these medications   acetaminophen 325 MG tablet Commonly known as:  TYLENOL Take 2 tablets (650 mg total) by mouth every 6 (six) hours as needed for mild pain (or Fever >/= 101).   allopurinol 100 MG tablet Commonly known as:  ZYLOPRIM Take 1 tablet (100 mg total) by mouth daily. Start taking on:  12/03/2017   multivitamin with minerals tablet Take 1 tablet by mouth daily.   pantoprazole 40 MG tablet Commonly known as:  PROTONIX Take 1 tablet (40 mg total) by mouth daily.   sodium bicarbonate 650 MG tablet Take 2 tablets (1,300 mg total) by mouth 2 (two) times daily.      No Known Allergies Follow-up Information    [facility].   Specialty:  Home Health Services Contact information: [address] [place] [address] [phone]        Devon Lung, MD Follow up on 12/05/2017.   Specialty:  Family Medicine Contact information: [place] [address] [phone]        Alexis Frock, MD Follow up on 12/08/2017.   Specialty:  Urology Why:  Surgery on Friday, office will call you with instructions Contact information: [place] [address] [phone]        Jamal Maes, MD Follow up in 3 week(s).   Specialty:  Nephrology Why:  Office will call you, if not please call for Follow up Contact information: [place] [address] [phone]            The results of significant diagnostics from  this hospitalization (including imaging, microbiology, ancillary and laboratory) are listed below for reference.    Significant Diagnostic Studies: Ct Abdomen Pelvis Wo Contrast  Result Date: 11/14/2017 CLINICAL DATA:  Intermittent generalized abdominal pain with nausea, vomiting and diarrhea for 6 weeks. Acute anemia, renal failure and guaiac-positive stool EXAM: CT ABDOMEN AND PELVIS WITHOUT CONTRAST TECHNIQUE: Multidetector CT imaging of the abdomen and pelvis was performed following the standard protocol without IV contrast. COMPARISON:  None. FINDINGS: Lower chest: No acute abnormality. Hepatobiliary: No focal liver abnormality is seen. No gallstones, gallbladder wall thickening, or biliary dilatation. Pancreas: Unremarkable. No pancreatic ductal dilatation or surrounding inflammatory changes. Spleen: Normal in size without focal abnormality. Adrenals/Urinary Tract: Adrenal glands are unremarkable. Bilateral hydronephrosis, moderate to severe in degree. Irregular thickening of the posterior walls of the bladder, the likely source of the bilateral hydroureter and hydronephrosis. Stomach/Bowel: Bowel is normal in caliber. No bowel wall thickening or evidence of bowel wall inflammation seen. Appendix is normal. Vascular/Lymphatic: Conglomerate lymphadenopathy throughout the retroperitoneum and along the bilateral iliac chain regions. No significant vascular abnormality. Reproductive: Prostate is unremarkable. Other: Periureteral fluid stranding. No circumscribed fluid collection or abscess like collection. No free intraperitoneal air. Musculoskeletal: Sclerotic  focus at the anterior aspects of the L3 vertebral body. Osseous structures otherwise unremarkable. IMPRESSION: 1. Bilateral hydronephrosis, moderate to severe in degree, most likely caused by the irregular thickening seen along the posterior bladder walls with presumed obstruction at the bilateral UVJs. This is highly suspicious for neoplastic bladder  wall thickening. Recommend urology consultation for possible cystoscopy. Percutaneous nephrostomy (or nephrostomies) may also be needed. 2. Conglomerate lymphadenopathy throughout the retroperitoneum and bilateral iliac chain regions, metastatic lymphadenopathy versus lymphoma. Consider tissue sampling. 3. Single sclerotic focus at the anterior aspects of the L3 vertebral body. This is most likely a benign bone island given the single site, however, early osseous metastasis cannot be excluded. Would consider nonemergent MRI or nuclear medicine bone scan for further characterization. Electronically Signed   By: Franki Cabot M.D.   On: 11/14/2017 22:06   Dg Chest 2 View  Result Date: 11/25/2017 CLINICAL DATA:  Fever EXAM: CHEST - 2 VIEW COMPARISON:  03/13/2006 FINDINGS: Normal heart size. Lungs under aerated and clear. No pneumothorax or pleural effusion. IMPRESSION: No active cardiopulmonary disease. Electronically Signed   By: Marybelle Killings M.D.   On: 11/25/2017 11:51   Ir Nephrostomy Placement Left  Result Date: 11/15/2017 INDICATION: Concern for bladder cancer, now with obstructive bilateral uropathy. Request made for placement of bilateral percutaneous nephrostomy catheters for renal preservation purposes. EXAM: 1. ULTRASOUND GUIDANCE FOR PUNCTURE OF THE BILATERAL RENAL COLLECTING SYSTEMS 2. BILATERAL PERCUTANEOUS NEPHROSTOMY TUBE PLACEMENT. COMPARISON:  CT abdomen pelvis - 11/14/2017 MEDICATIONS: Rocephin 2 g IV;; the antibiotic was administered in an appropriate time frame prior to skin puncture. ANESTHESIA/SEDATION: Moderate (conscious) sedation was employed during this procedure. A total of Versed 3 mg and Fentanyl 175 mcg was administered intravenously. Moderate Sedation Time: 37 minutes. The patient's level of consciousness and vital signs were monitored continuously by radiology nursing throughout the procedure under my direct supervision. CONTRAST:  A total of approximately 20 mL Isovue 300 was  administered into both renal collecting systems FLUOROSCOPY TIME:  5 minutes, 18 seconds (177 mGy) COMPLICATIONS: None immediate. PROCEDURE: The procedure, risks, benefits, and alternatives were explained to the patient. Questions regarding the procedure were encouraged and answered. The patient understands and consents to the procedure. A timeout was performed prior to the initiation of the procedure. The bilateral flanks were prepped and draped in usual sterile fashion with a sterile drape was applied covering the operative field. A sterile gown and sterile gloves were used for the procedure. Local anesthesia was provided with 1% Lidocaine with epinephrine. Ultrasound was used to localize the left kidney. Under direct ultrasound guidance, a 21 gauge needle was advanced into the renal collecting system. An ultrasound image documentation was performed. Access within the collecting system was confirmed with the efflux of urine followed by contrast injection. Over a Nitrex wire, the tract was dilated with an Accustick stent. Contrast injection confirmed appropriate positioning. Next, over a short Amplatz wire and under intermittent fluoroscopic guidance, the track was dilated allowing placement of a 10-French percutaneous nephrostomy catheter with end coiled and locked within the left renal pelvis. Contrast was injected and several sport radiographs were obtained in various obliquities confirming access. The catheter was secured at the skin with a Prolene retention suture and a gravity bag was placed. The identical procedure was then performed for the right kidney ultimately allowing placement of a 10 French percutaneous nephrostomy catheter via a right posterior inferior calyx with and coiled and locked within right renal pelvis. Both nephrostomy catheters were connected to  gravity bags and dressings were placed. Patient tolerated the procedure well without immediate postprocedural complication. FINDINGS: Ultrasound  scanning demonstrates a moderate to severely dilated collecting systems bilaterally. Under direct ultrasound guidance, posterior inferior calices were targeted bilaterally allowing placement of 10-French percutaneous nephrostomy catheters under intermittent fluoroscopic guidance. Bilateral percutaneous nephrostomy catheters are coiled within the bilateral renal pelvis ease. Contrast injection confirmed appropriate positioning. IMPRESSION: Successful ultrasound and fluoroscopic guided placement of bilateral 10 French percutaneous nephrostomy catheters. PLAN: - Maintain bilateral nephrostomy catheters to gravity bags. Recommend Q shift flushing with approximately 10 cc of saline until urine is no longer blood tinged. - Would allow for 2-3 weeks of track maturation prior to proceeding with definitive bilateral nephrostograms and attempted placement of antegrade ureteral stents. Electronically Signed   By: Sandi Mariscal M.D.   On: 11/15/2017 12:54   Ir Nephrostomy Placement Right  Result Date: 11/15/2017 INDICATION: Concern for bladder cancer, now with obstructive bilateral uropathy. Request made for placement of bilateral percutaneous nephrostomy catheters for renal preservation purposes. EXAM: 1. ULTRASOUND GUIDANCE FOR PUNCTURE OF THE BILATERAL RENAL COLLECTING SYSTEMS 2. BILATERAL PERCUTANEOUS NEPHROSTOMY TUBE PLACEMENT. COMPARISON:  CT abdomen pelvis - 11/14/2017 MEDICATIONS: Rocephin 2 g IV;; the antibiotic was administered in an appropriate time frame prior to skin puncture. ANESTHESIA/SEDATION: Moderate (conscious) sedation was employed during this procedure. A total of Versed 3 mg and Fentanyl 175 mcg was administered intravenously. Moderate Sedation Time: 37 minutes. The patient's level of consciousness and vital signs were monitored continuously by radiology nursing throughout the procedure under my direct supervision. CONTRAST:  A total of approximately 20 mL Isovue 300 was administered into both renal  collecting systems FLUOROSCOPY TIME:  5 minutes, 18 seconds (096 mGy) COMPLICATIONS: None immediate. PROCEDURE: The procedure, risks, benefits, and alternatives were explained to the patient. Questions regarding the procedure were encouraged and answered. The patient understands and consents to the procedure. A timeout was performed prior to the initiation of the procedure. The bilateral flanks were prepped and draped in usual sterile fashion with a sterile drape was applied covering the operative field. A sterile gown and sterile gloves were used for the procedure. Local anesthesia was provided with 1% Lidocaine with epinephrine. Ultrasound was used to localize the left kidney. Under direct ultrasound guidance, a 21 gauge needle was advanced into the renal collecting system. An ultrasound image documentation was performed. Access within the collecting system was confirmed with the efflux of urine followed by contrast injection. Over a Nitrex wire, the tract was dilated with an Accustick stent. Contrast injection confirmed appropriate positioning. Next, over a short Amplatz wire and under intermittent fluoroscopic guidance, the track was dilated allowing placement of a 10-French percutaneous nephrostomy catheter with end coiled and locked within the left renal pelvis. Contrast was injected and several sport radiographs were obtained in various obliquities confirming access. The catheter was secured at the skin with a Prolene retention suture and a gravity bag was placed. The identical procedure was then performed for the right kidney ultimately allowing placement of a 10 French percutaneous nephrostomy catheter via a right posterior inferior calyx with and coiled and locked within right renal pelvis. Both nephrostomy catheters were connected to gravity bags and dressings were placed. Patient tolerated the procedure well without immediate postprocedural complication. FINDINGS: Ultrasound scanning demonstrates a  moderate to severely dilated collecting systems bilaterally. Under direct ultrasound guidance, posterior inferior calices were targeted bilaterally allowing placement of 10-French percutaneous nephrostomy catheters under intermittent fluoroscopic guidance. Bilateral percutaneous nephrostomy catheters are coiled  within the bilateral renal pelvis ease. Contrast injection confirmed appropriate positioning. IMPRESSION: Successful ultrasound and fluoroscopic guided placement of bilateral 10 French percutaneous nephrostomy catheters. PLAN: - Maintain bilateral nephrostomy catheters to gravity bags. Recommend Q shift flushing with approximately 10 cc of saline until urine is no longer blood tinged. - Would allow for 2-3 weeks of track maturation prior to proceeding with definitive bilateral nephrostograms and attempted placement of antegrade ureteral stents. Electronically Signed   By: Sandi Mariscal M.D.   On: 11/15/2017 12:54    Microbiology: Recent Results (from the past 240 hour(s))  Urine Culture     Status: Abnormal   Collection Time: 11/25/17  7:55 AM  Result Value Ref Range Status   Specimen Description URINE, CLEAN CATCH  Final   Special Requests NONE  Final   Culture (A)  Final    <10,000 COLONIES/mL INSIGNIFICANT GROWTH Performed at [facility], [address], [place], [place] [address]    Report Status 11/26/2017 FINAL  Final  Culture, blood (routine x 2)     Status: None   Collection Time: 11/25/17  9:02 AM  Result Value Ref Range Status   Specimen Description BLOOD LEFT HAND  Final   Special Requests   Final    BOTTLES DRAWN AEROBIC AND ANAEROBIC Blood Culture adequate volume   Culture   Final    NO GROWTH 5 DAYS Performed at [facility], [place] [address], [place], [place] [address]    Report Status 11/30/2017 FINAL  Final  Culture, blood (routine x 2)     Status: None   Collection Time: 11/25/17  9:02 AM  Result Value Ref Range Status   Specimen Description BLOOD LEFT  HAND  Final   Special Requests   Final    BOTTLES DRAWN AEROBIC AND ANAEROBIC Blood Culture adequate volume   Culture   Final    NO GROWTH 5 DAYS Performed at [facility], [place] [address] [place], [place] [address]    Report Status 11/30/2017 FINAL  Final     Labs: Basic Metabolic Panel: Recent Labs  Lab 11/27/17 0133 11/28/17 0446 11/29/17 0438 11/30/17 0654 12/01/17 0507 12/02/17 0724  NA 139 138 138 139 134* 139  K 4.3 4.5 4.6 4.6 4.4 4.5  CL 106 106 109 113* 103 107  CO2 22 21* 20* 19* 19* 21*  GLUCOSE 104* 130* 99 86 107* 104*  BUN 63* 61* 59* 64* 62* 63*  CREATININE 7.24* 6.36* 5.69* 5.44* 5.25* 5.19*  CALCIUM 8.4* 8.5* 8.4* 8.6* 8.8* 9.2  PHOS 4.2 3.5 3.5 4.9* 4.2  --    Liver Function Tests: Recent Labs  Lab 11/27/17 0133 11/28/17 0446 11/29/17 0438 11/30/17 0654 12/01/17 0507  ALBUMIN 2.4* 2.3* 2.4* 2.5* 2.6*   No results for input(s): LIPASE, AMYLASE in the last 168 hours. No results for input(s): AMMONIA in the last 168 hours. CBC: Recent Labs  Lab 11/26/17 0319 11/27/17 0133 11/27/17 1645 11/28/17 0446 11/30/17 0654  WBC 17.9* 13.8*  --  14.6* 13.1*  HGB 7.0* 7.5* 8.5* 7.9* 8.6*  HCT 21.5* 23.0* 26.3* 24.1* 25.9*  MCV 77.6* 78.0  --  78.2 78.5  PLT 272 274  --  288 332   Cardiac Enzymes: No results for input(s): CKTOTAL, CKMB, CKMBINDEX, TROPONINI in the last 168 hours. BNP: BNP (last 3 results) No results for input(s): BNP in the last 8760 hours.  ProBNP (last 3 results) No results for input(s): PROBNP in the last  8760 hours.  CBG: No results for input(s): GLUCAP in the last 168 hours.     Signed:  Domenic Polite MD.  [facility] 12/02/2017, 12:23 PM

## 2017-12-02 NOTE — Progress Notes (Signed)
Pt discharged to home. IV and telemetry removed. AVS given. Discharge instructions discussed with patient and spouse. Their questions were answered. We discussed follow up appointments and discharge prescriptions. Scripts for medications given to pt. I taught pt and spouse how to flush nephrostomy tubes and they taught back and verbalized understanding. Supplies sent with pt until he can see home health. Pt in stable condition.

## 2017-12-04 ENCOUNTER — Telehealth: Payer: Self-pay

## 2017-12-04 NOTE — Telephone Encounter (Signed)
ok 

## 2017-12-04 NOTE — Telephone Encounter (Signed)
Levada Dy (nurse case manager) called stating Pt needs an order for Home Health RN. Pt was seen at [facility] and disharged on Saturday and [facility]. but they did not ensure the order was put in.    Please contact [place] once order has been placed.

## 2017-12-05 ENCOUNTER — Encounter: Payer: Self-pay | Admitting: Family Medicine

## 2017-12-05 ENCOUNTER — Other Ambulatory Visit: Payer: Self-pay

## 2017-12-05 ENCOUNTER — Ambulatory Visit (INDEPENDENT_AMBULATORY_CARE_PROVIDER_SITE_OTHER): Payer: 59 | Admitting: Family Medicine

## 2017-12-05 VITALS — BP 128/82 | HR 86 | Temp 98.9°F | Wt 289.8 lb

## 2017-12-05 DIAGNOSIS — Z5189 Encounter for other specified aftercare: Secondary | ICD-10-CM

## 2017-12-05 DIAGNOSIS — N179 Acute kidney failure, unspecified: Secondary | ICD-10-CM

## 2017-12-05 DIAGNOSIS — K219 Gastro-esophageal reflux disease without esophagitis: Secondary | ICD-10-CM

## 2017-12-05 DIAGNOSIS — R972 Elevated prostate specific antigen [PSA]: Secondary | ICD-10-CM

## 2017-12-05 DIAGNOSIS — N400 Enlarged prostate without lower urinary tract symptoms: Secondary | ICD-10-CM | POA: Diagnosis not present

## 2017-12-05 DIAGNOSIS — D62 Acute posthemorrhagic anemia: Secondary | ICD-10-CM

## 2017-12-05 LAB — CBC WITH DIFFERENTIAL/PLATELET
Basophils Absolute: 0 10*3/uL (ref 0.0–0.2)
Basos: 0 %
EOS (ABSOLUTE): 0.2 10*3/uL (ref 0.0–0.4)
EOS: 2 %
HEMATOCRIT: 31.1 % — AB (ref 37.5–51.0)
HEMOGLOBIN: 10.2 g/dL — AB (ref 13.0–17.7)
IMMATURE GRANS (ABS): 0.2 10*3/uL — AB (ref 0.0–0.1)
IMMATURE GRANULOCYTES: 2 %
Lymphocytes Absolute: 1.6 10*3/uL (ref 0.7–3.1)
Lymphs: 17 %
MCH: 25.5 pg — ABNORMAL LOW (ref 26.6–33.0)
MCHC: 32.8 g/dL (ref 31.5–35.7)
MCV: 78 fL — AB (ref 79–97)
MONOCYTES: 17 %
MONOS ABS: 1.6 10*3/uL — AB (ref 0.1–0.9)
NEUTROS PCT: 62 %
Neutrophils Absolute: 5.9 10*3/uL (ref 1.4–7.0)
Platelets: 404 10*3/uL (ref 150–450)
RBC: 4 x10E6/uL — ABNORMAL LOW (ref 4.14–5.80)
RDW: 17.9 % — AB (ref 12.3–15.4)
WBC: 9.6 10*3/uL (ref 3.4–10.8)

## 2017-12-05 LAB — COMPREHENSIVE METABOLIC PANEL
A/G RATIO: 1 — AB (ref 1.2–2.2)
ALBUMIN: 3.9 g/dL (ref 3.5–5.5)
ALT: 19 IU/L (ref 0–44)
AST: 19 IU/L (ref 0–40)
Alkaline Phosphatase: 212 IU/L — ABNORMAL HIGH (ref 39–117)
BUN/Creatinine Ratio: 11 (ref 9–20)
BUN: 48 mg/dL — ABNORMAL HIGH (ref 6–24)
Bilirubin Total: 0.2 mg/dL (ref 0.0–1.2)
CALCIUM: 10 mg/dL (ref 8.7–10.2)
CO2: 18 mmol/L — AB (ref 20–29)
CREATININE: 4.32 mg/dL — AB (ref 0.76–1.27)
Chloride: 103 mmol/L (ref 96–106)
GFR, EST AFRICAN AMERICAN: 17 mL/min/{1.73_m2} — AB (ref >59)
GFR, EST NON AFRICAN AMERICAN: 14 mL/min/{1.73_m2} — AB (ref >59)
Globulin, Total: 3.9 g/dL (ref 1.5–4.5)
Glucose: 101 mg/dL — ABNORMAL HIGH (ref 65–99)
POTASSIUM: 5.3 mmol/L — AB (ref 3.5–5.2)
SODIUM: 137 mmol/L (ref 134–144)
TOTAL PROTEIN: 7.8 g/dL (ref 6.0–8.5)

## 2017-12-05 NOTE — Progress Notes (Addendum)
   Subjective:    Patient ID: Devon Cook, male    DOB: [birth year], 57 y.o.   MRN: [id number]  HPI He is here for posthospitalization follow-up.  He was admitted for anemia as well as renal failure.  He was subsequently found to have retroperitoneal adenopathy as well as bilateral urinary obstruction.  He was given blood transfusions.  The obstruction was taking care of and he now has tubes that are draining adequate amount of urine.  He is set up to be seen by urology for further evaluation and to make a definitive diagnosis.  He has had no nausea, vomiting, abdominal pain.  He left the hospital Saturday but has not been checked by nursing for the ureteral stents. He is having difficulty with reflux symptoms and is presently taking Protonix and also using bicarbonate. Review of Systems     Objective:   Physical Exam Alert and in no distress.  Exam of the flank does show bilateral stents that are draining urine.  No abdominal pain.  Cardiac and lung exam normal. Hospital record examined including discharge summary, blood work, scans.      Assessment & Plan:  Acute blood loss anemia - Plan: CBC with Differential/Platelet, Comprehensive metabolic panel  Acute renal failure, unspecified acute renal failure type ([place]) - Plan: CBC with Differential/Platelet, Comprehensive metabolic panel  Benign prostatic hyperplasia with elevated prostate specific antigen (PSA)  Gastroesophageal reflux disease, esophagitis presence not specified Recommend he raise the head of his bed and not he did anything within several hours of bedtime to help with the reflux symptoms. I will get routine blood screening and he will keep his appointment with urology.  Informed him that urology and probably oncology will be providing the major bulk of his care but they can certainly call me if they have questions.

## 2017-12-06 ENCOUNTER — Other Ambulatory Visit: Payer: Self-pay

## 2017-12-06 NOTE — Patient Instructions (Addendum)
BERTRAN ZEIMET  12/07/2017   Your procedure is scheduled on: 12-08-17  Report to [facility] Main  Entrance    Report to admitting at 11:30AM    Call this number if you have problems the morning of surgery [phone]     Remember: Do not eat food After Midnight. You may have clear liquids from midnight until 7:30AM!     Take these medicines the morning of surgery with A SIP OF WATER: pantoprazole(protonix)                                 You may not have any metal on your body including hair pins and              piercings  Do not wear jewelry, make-up, lotions, powders or perfumes, deodorant                         Men may shave face and neck.   Do not bring valuables to the hospital. [place].  Contacts, dentures or bridgework may not be worn into surgery.       Patients discharged the day of surgery will not be allowed to drive home.  Name and phone number of your driver:  Special Instructions: N/A              Please read over the following fact sheets you were given: _____________________________________________________________________            [facility] - Preparing for Surgery Before surgery, you can play an important role.  Because skin is not sterile, your skin needs to be as free of germs as possible.  You can reduce the number of germs on your skin by washing with CHG (chlorahexidine gluconate) soap before surgery.  CHG is an antiseptic cleaner which kills germs and bonds with the skin to continue killing germs even after washing. Please DO NOT use if you have an allergy to CHG or antibacterial soaps.  If your skin becomes reddened/irritated stop using the CHG and inform your nurse when you arrive at Short Stay. Do not shave (including legs and underarms) for at least 48 hours prior to the first CHG shower.  You may shave your face/neck. Please follow these instructions carefully:  1.   Shower with CHG Soap the night before surgery and the  morning of Surgery.  2.  If you choose to wash your hair, wash your hair first as usual with your  normal  shampoo.  3.  After you shampoo, rinse your hair and body thoroughly to remove the  shampoo.                           4.  Use CHG as you would any other liquid soap.  You can apply chg directly  to the skin and wash                       Gently with a scrungie or clean washcloth.  5.  Apply the CHG Soap to your body ONLY FROM THE NECK DOWN.   Do not use on face/ open  Wound or open sores. Avoid contact with eyes, ears mouth and genitals (private parts).                       Wash face,  Genitals (private parts) with your normal soap.             6.  Wash thoroughly, paying special attention to the area where your surgery  will be performed.  7.  Thoroughly rinse your body with warm water from the neck down.  8.  DO NOT shower/wash with your normal soap after using and rinsing off  the CHG Soap.                9.  Pat yourself dry with a clean towel.            10.  Wear clean pajamas.            11.  Place clean sheets on your bed the night of your first shower and do not  sleep with pets. Day of Surgery : Do not apply any lotions/deodorants the morning of surgery.  Please wear clean clothes to the hospital/surgery center.  FAILURE TO FOLLOW THESE INSTRUCTIONS MAY RESULT IN THE CANCELLATION OF YOUR SURGERY PATIENT SIGNATURE_________________________________  NURSE SIGNATURE__________________________________  ________________________________________________________________________    CLEAR LIQUID DIET   Foods Allowed                                                                     Foods Excluded  Coffee and tea, regular and decaf                             liquids that you cannot  Plain Jell-O in any flavor                                             see through such as: Fruit ices (not with fruit  pulp)                                     milk, soups, orange juice  Iced Popsicles                                    All solid food Carbonated beverages, regular and diet                                    Cranberry, grape and apple juices Sports drinks like Gatorade Lightly seasoned clear broth or consume(fat free) Sugar, honey syrup  Sample Menu Breakfast                                Lunch  Supper Cranberry juice                    Beef broth                            Chicken broth Jell-O                                     Grape juice                           Apple juice Coffee or tea                        Jell-O                                      Popsicle                                                Coffee or tea                        Coffee or tea  _____________________________________________________________________

## 2017-12-07 ENCOUNTER — Encounter (HOSPITAL_COMMUNITY)
Admission: RE | Admit: 2017-12-07 | Discharge: 2017-12-07 | Disposition: A | Discharge status: Home / Self Care | Payer: 59 | Source: Ambulatory Visit | Attending: Urology | Admitting: Urology

## 2017-12-07 ENCOUNTER — Encounter (HOSPITAL_COMMUNITY): Payer: Self-pay

## 2017-12-07 DIAGNOSIS — N1330 Unspecified hydronephrosis: Secondary | ICD-10-CM | POA: Diagnosis not present

## 2017-12-07 DIAGNOSIS — C61 Malignant neoplasm of prostate: Secondary | ICD-10-CM | POA: Diagnosis present

## 2017-12-07 DIAGNOSIS — N529 Male erectile dysfunction, unspecified: Secondary | ICD-10-CM | POA: Diagnosis not present

## 2017-12-07 DIAGNOSIS — R972 Elevated prostate specific antigen [PSA]: Secondary | ICD-10-CM | POA: Diagnosis not present

## 2017-12-07 DIAGNOSIS — D649 Anemia, unspecified: Secondary | ICD-10-CM | POA: Diagnosis not present

## 2017-12-07 DIAGNOSIS — I10 Essential (primary) hypertension: Secondary | ICD-10-CM | POA: Diagnosis not present

## 2017-12-07 DIAGNOSIS — Z87891 Personal history of nicotine dependence: Secondary | ICD-10-CM | POA: Diagnosis not present

## 2017-12-07 DIAGNOSIS — C675 Malignant neoplasm of bladder neck: Secondary | ICD-10-CM | POA: Diagnosis not present

## 2017-12-07 DIAGNOSIS — Z79899 Other long term (current) drug therapy: Secondary | ICD-10-CM | POA: Diagnosis not present

## 2017-12-07 HISTORY — DX: Acute kidney failure, unspecified: N17.9

## 2017-12-07 HISTORY — DX: Anemia, unspecified: D64.9

## 2017-12-07 LAB — COMPREHENSIVE METABOLIC PANEL
ALBUMIN: 3.5 g/dL (ref 3.5–5.0)
ALT: 21 U/L (ref 17–63)
AST: 22 U/L (ref 15–41)
Alkaline Phosphatase: 196 U/L — ABNORMAL HIGH (ref 38–126)
Anion gap: 11 (ref 5–15)
BILIRUBIN TOTAL: 0.4 mg/dL (ref 0.3–1.2)
BUN: 48 mg/dL — ABNORMAL HIGH (ref 6–20)
CO2: 22 mmol/L (ref 22–32)
Calcium: 9.7 mg/dL (ref 8.9–10.3)
Chloride: 103 mmol/L (ref 101–111)
Creatinine, Ser: 4.32 mg/dL — ABNORMAL HIGH (ref 0.61–1.24)
GFR calc Af Amer: 16 mL/min — ABNORMAL LOW (ref >60)
GFR calc non Af Amer: 14 mL/min — ABNORMAL LOW (ref >60)
GLUCOSE: 104 mg/dL — AB (ref 65–99)
POTASSIUM: 5.3 mmol/L — AB (ref 3.5–5.1)
Sodium: 136 mmol/L (ref 135–145)
TOTAL PROTEIN: 8.4 g/dL — AB (ref 6.5–8.1)

## 2017-12-07 LAB — CBC
HEMATOCRIT: 31.1 % — AB (ref 39.0–52.0)
Hemoglobin: 10.4 g/dL — ABNORMAL LOW (ref 13.0–17.0)
MCH: 25.4 pg — ABNORMAL LOW (ref 26.0–34.0)
MCHC: 33.4 g/dL (ref 30.0–36.0)
MCV: 76 fL — ABNORMAL LOW (ref 78.0–100.0)
Platelets: 391 10*3/uL (ref 150–400)
RBC: 4.09 MIL/uL — ABNORMAL LOW (ref 4.22–5.81)
RDW: 17.8 % — ABNORMAL HIGH (ref 11.5–15.5)
WBC: 12.2 10*3/uL — ABNORMAL HIGH (ref 4.0–10.5)

## 2017-12-07 NOTE — Progress Notes (Signed)
12-07-17 CMP result routed to Dr. Tresa Moore for review

## 2017-12-08 ENCOUNTER — Encounter (HOSPITAL_COMMUNITY)
Admission: RE | Disposition: A | Discharge status: Home / Self Care | Payer: Self-pay | Source: Other Acute Inpatient Hospital | Attending: Urology

## 2017-12-08 ENCOUNTER — Observation Stay (HOSPITAL_COMMUNITY)
Admission: RE | Admit: 2017-12-08 | Discharge: 2017-12-09 | Disposition: A | Discharge status: Home / Self Care | Payer: 59 | Source: Other Acute Inpatient Hospital | Attending: Urology | Admitting: Urology

## 2017-12-08 ENCOUNTER — Ambulatory Visit (HOSPITAL_COMMUNITY): Payer: 59 | Admitting: Anesthesiology

## 2017-12-08 ENCOUNTER — Ambulatory Visit (HOSPITAL_COMMUNITY): Payer: 59

## 2017-12-08 ENCOUNTER — Other Ambulatory Visit: Payer: Self-pay

## 2017-12-08 ENCOUNTER — Encounter (HOSPITAL_COMMUNITY): Payer: Self-pay | Admitting: Anesthesiology

## 2017-12-08 DIAGNOSIS — D494 Neoplasm of unspecified behavior of bladder: Secondary | ICD-10-CM | POA: Diagnosis not present

## 2017-12-08 DIAGNOSIS — N1330 Unspecified hydronephrosis: Secondary | ICD-10-CM | POA: Diagnosis not present

## 2017-12-08 DIAGNOSIS — I10 Essential (primary) hypertension: Secondary | ICD-10-CM | POA: Insufficient documentation

## 2017-12-08 DIAGNOSIS — Z87891 Personal history of nicotine dependence: Secondary | ICD-10-CM | POA: Insufficient documentation

## 2017-12-08 DIAGNOSIS — Z79899 Other long term (current) drug therapy: Secondary | ICD-10-CM | POA: Insufficient documentation

## 2017-12-08 DIAGNOSIS — R972 Elevated prostate specific antigen [PSA]: Secondary | ICD-10-CM | POA: Diagnosis not present

## 2017-12-08 DIAGNOSIS — C675 Malignant neoplasm of bladder neck: Secondary | ICD-10-CM | POA: Insufficient documentation

## 2017-12-08 DIAGNOSIS — C61 Malignant neoplasm of prostate: Secondary | ICD-10-CM | POA: Diagnosis not present

## 2017-12-08 DIAGNOSIS — N529 Male erectile dysfunction, unspecified: Secondary | ICD-10-CM | POA: Insufficient documentation

## 2017-12-08 DIAGNOSIS — D649 Anemia, unspecified: Secondary | ICD-10-CM | POA: Insufficient documentation

## 2017-12-08 HISTORY — PX: CYSTOSCOPY W/ URETERAL STENT PLACEMENT: SHX1429

## 2017-12-08 HISTORY — PX: TRANSURETHRAL RESECTION OF BLADDER TUMOR: SHX2575

## 2017-12-08 HISTORY — PX: PROSTATE BIOPSY: SHX241

## 2017-12-08 SURGERY — BIOPSY, PROSTATE, RECTAL APPROACH, WITH US GUIDANCE
Anesthesia: General

## 2017-12-08 MED ORDER — HYDROMORPHONE HCL 1 MG/ML IJ SOLN
INTRAMUSCULAR | Status: AC
  Filled 2017-12-08: qty 1

## 2017-12-08 MED ORDER — SODIUM BICARBONATE 650 MG PO TABS
1300.0000 mg | ORAL_TABLET | Freq: Two times a day (BID) | ORAL | Status: DC
  Administered 2017-12-08 – 2017-12-09 (×2): 1300 mg via ORAL
  Filled 2017-12-08 (×2): qty 2

## 2017-12-08 MED ORDER — ONDANSETRON HCL 4 MG/2ML IJ SOLN: 4.0000 mg | Freq: Once | INTRAMUSCULAR | Status: DC | PRN

## 2017-12-08 MED ORDER — SENNOSIDES-DOCUSATE SODIUM 8.6-50 MG PO TABS
1.0000 | ORAL_TABLET | Freq: Two times a day (BID) | ORAL | Status: DC
  Administered 2017-12-08 – 2017-12-09 (×2): 1 via ORAL
  Filled 2017-12-08 (×2): qty 1

## 2017-12-08 MED ORDER — SODIUM CHLORIDE 0.9 % IV SOLN
INTRAVENOUS | Status: DC
  Administered 2017-12-08 – 2017-12-09 (×2): via INTRAVENOUS

## 2017-12-08 MED ORDER — BELLADONNA ALKALOIDS-OPIUM 16.2-60 MG RE SUPP
RECTAL | Status: AC
  Filled 2017-12-08: qty 1

## 2017-12-08 MED ORDER — PROPOFOL 10 MG/ML IV BOLUS
INTRAVENOUS | Status: DC | PRN
  Administered 2017-12-08: 200 mg via INTRAVENOUS

## 2017-12-08 MED ORDER — SUGAMMADEX SODIUM 200 MG/2ML IV SOLN
INTRAVENOUS | Status: AC
  Filled 2017-12-08: qty 6

## 2017-12-08 MED ORDER — MIDAZOLAM HCL 2 MG/2ML IJ SOLN
INTRAMUSCULAR | Status: AC
  Filled 2017-12-08: qty 2

## 2017-12-08 MED ORDER — OXYCODONE HCL 5 MG PO TABS
5.0000 mg | ORAL_TABLET | ORAL | Status: DC | PRN
  Administered 2017-12-09: 5 mg via ORAL
  Filled 2017-12-08: qty 1

## 2017-12-08 MED ORDER — PROPOFOL 10 MG/ML IV BOLUS
INTRAVENOUS | Status: AC
  Filled 2017-12-08: qty 20

## 2017-12-08 MED ORDER — IOHEXOL 300 MG/ML  SOLN
INTRAMUSCULAR | Status: DC | PRN
  Administered 2017-12-08: 20 mL

## 2017-12-08 MED ORDER — PHENYLEPHRINE HCL 10 MG/ML IJ SOLN
INTRAMUSCULAR | Status: DC | PRN
  Administered 2017-12-08 (×5): 80 ug via INTRAVENOUS

## 2017-12-08 MED ORDER — ZOLPIDEM TARTRATE 5 MG PO TABS
5.0000 mg | ORAL_TABLET | Freq: Every evening | ORAL | Status: DC | PRN
  Administered 2017-12-08: 5 mg via ORAL
  Filled 2017-12-08: qty 1

## 2017-12-08 MED ORDER — LACTATED RINGERS IV SOLN
INTRAVENOUS | Status: DC
  Administered 2017-12-08: 12:00:00 via INTRAVENOUS

## 2017-12-08 MED ORDER — MEPERIDINE HCL 50 MG/ML IJ SOLN: 6.2500 mg | INTRAMUSCULAR | Status: DC | PRN

## 2017-12-08 MED ORDER — SODIUM CHLORIDE 0.9 % IR SOLN
Status: DC | PRN
  Administered 2017-12-08: 6000 mL

## 2017-12-08 MED ORDER — LIDOCAINE HCL (CARDIAC) PF 100 MG/5ML IV SOSY
PREFILLED_SYRINGE | INTRAVENOUS | Status: DC | PRN
  Administered 2017-12-08: 100 mg via INTRAVENOUS

## 2017-12-08 MED ORDER — HYDROMORPHONE HCL 1 MG/ML IJ SOLN
0.2500 mg | INTRAMUSCULAR | Status: DC | PRN
  Administered 2017-12-08 (×4): 0.5 mg via INTRAVENOUS

## 2017-12-08 MED ORDER — ALLOPURINOL 100 MG PO TABS
100.0000 mg | ORAL_TABLET | Freq: Every day | ORAL | Status: DC
  Administered 2017-12-08 – 2017-12-09 (×2): 100 mg via ORAL
  Filled 2017-12-08 (×2): qty 1

## 2017-12-08 MED ORDER — SODIUM CHLORIDE 0.9 % IV SOLN
INTRAVENOUS | Status: AC
  Filled 2017-12-08: qty 20

## 2017-12-08 MED ORDER — FENTANYL CITRATE (PF) 100 MCG/2ML IJ SOLN
INTRAMUSCULAR | Status: AC
  Filled 2017-12-08: qty 2

## 2017-12-08 MED ORDER — SUGAMMADEX SODIUM 500 MG/5ML IV SOLN
INTRAVENOUS | Status: AC
  Filled 2017-12-08: qty 5

## 2017-12-08 MED ORDER — ENSURE ENLIVE PO LIQD
237.0000 mL | Freq: Two times a day (BID) | ORAL | Status: DC
  Administered 2017-12-09: 237 mL via ORAL

## 2017-12-08 MED ORDER — ONDANSETRON HCL 4 MG/2ML IJ SOLN
INTRAMUSCULAR | Status: DC | PRN
  Administered 2017-12-08: 4 mg via INTRAVENOUS

## 2017-12-08 MED ORDER — LIDOCAINE 2% (20 MG/ML) 5 ML SYRINGE
INTRAMUSCULAR | Status: AC
  Filled 2017-12-08: qty 5

## 2017-12-08 MED ORDER — EPHEDRINE 5 MG/ML INJ
INTRAVENOUS | Status: AC
  Filled 2017-12-08: qty 20

## 2017-12-08 MED ORDER — OXYCODONE-ACETAMINOPHEN 5-325 MG PO TABS: 1.0000 | ORAL_TABLET | Freq: Four times a day (QID) | ORAL | 0 refills | Status: DC | PRN

## 2017-12-08 MED ORDER — MIDAZOLAM HCL 5 MG/5ML IJ SOLN
INTRAMUSCULAR | Status: DC | PRN
  Administered 2017-12-08: 2 mg via INTRAVENOUS

## 2017-12-08 MED ORDER — CEFTRIAXONE SODIUM 2 G IJ SOLR
INTRAMUSCULAR | Status: DC | PRN
  Administered 2017-12-08: 2 g via INTRAVENOUS

## 2017-12-08 MED ORDER — HYDROMORPHONE HCL 1 MG/ML IJ SOLN
0.5000 mg | INTRAMUSCULAR | Status: DC | PRN
  Administered 2017-12-09: 1 mg via INTRAVENOUS
  Filled 2017-12-08 (×2): qty 1

## 2017-12-08 MED ORDER — FENTANYL CITRATE (PF) 100 MCG/2ML IJ SOLN
INTRAMUSCULAR | Status: DC | PRN
  Administered 2017-12-08: 50 ug via INTRAVENOUS
  Administered 2017-12-08: 25 ug via INTRAVENOUS
  Administered 2017-12-08 (×4): 50 ug via INTRAVENOUS
  Administered 2017-12-08: 25 ug via INTRAVENOUS
  Administered 2017-12-08 (×2): 50 ug via INTRAVENOUS

## 2017-12-08 MED ORDER — PANTOPRAZOLE SODIUM 40 MG PO TBEC
40.0000 mg | DELAYED_RELEASE_TABLET | Freq: Every day | ORAL | Status: DC
  Administered 2017-12-09: 40 mg via ORAL
  Filled 2017-12-08: qty 1

## 2017-12-08 MED ORDER — ACETAMINOPHEN 500 MG PO TABS
1000.0000 mg | ORAL_TABLET | Freq: Three times a day (TID) | ORAL | Status: AC
  Administered 2017-12-08 – 2017-12-09 (×3): 1000 mg via ORAL
  Filled 2017-12-08 (×3): qty 2

## 2017-12-08 MED ORDER — SENNOSIDES-DOCUSATE SODIUM 8.6-50 MG PO TABS: 1.0000 | ORAL_TABLET | Freq: Two times a day (BID) | ORAL | 0 refills | Status: DC

## 2017-12-08 MED ORDER — BELLADONNA ALKALOIDS-OPIUM 16.2-60 MG RE SUPP
1.0000 | Freq: Every day | RECTAL | Status: DC
  Administered 2017-12-08: 1 via RECTAL

## 2017-12-08 SURGICAL SUPPLY — 24 items
BAG URINE DRAINAGE (UROLOGICAL SUPPLIES) IMPLANT
BAG URO CATCHER STRL LF (MISCELLANEOUS) ×3 IMPLANT
BASKET ZERO TIP NITINOL 2.4FR (BASKET) IMPLANT
CATH COUDE 22FR 5CC (CATHETERS) ×2 IMPLANT
CATH INTERMIT  6FR 70CM (CATHETERS) ×3 IMPLANT
CATH RIBBED COUDE 30CC (CATHETERS) ×1
CATH UROLOGY TORQUE 40 (MISCELLANEOUS) ×3 IMPLANT
CLOTH BEACON ORANGE TIMEOUT ST (SAFETY) ×3 IMPLANT
COVER FOOTSWITCH UNIV (MISCELLANEOUS) IMPLANT
COVER SURGICAL LIGHT HANDLE (MISCELLANEOUS) IMPLANT
ELECT REM PT RETURN 15FT ADLT (MISCELLANEOUS) ×3 IMPLANT
EVACUATOR MICROVAS BLADDER (UROLOGICAL SUPPLIES) IMPLANT
GLOVE BIOGEL M STRL SZ7.5 (GLOVE) ×3 IMPLANT
GOWN STRL REUS W/TWL LRG LVL3 (GOWN DISPOSABLE) ×6 IMPLANT
GUIDEWIRE ANG ZIPWIRE 038X150 (WIRE) ×3 IMPLANT
GUIDEWIRE STR DUAL SENSOR (WIRE) ×3 IMPLANT
LOOP CUT BIPOLAR 24F LRG (ELECTROSURGICAL) IMPLANT
MANIFOLD NEPTUNE II (INSTRUMENTS) ×3 IMPLANT
PACK CYSTO (CUSTOM PROCEDURE TRAY) ×3 IMPLANT
SET ASPIRATION TUBING (TUBING) IMPLANT
STENT CONTOUR 7FRX26X.038 (STENTS) ×3 IMPLANT
SYRINGE IRR TOOMEY STRL 70CC (SYRINGE) IMPLANT
TUBING CONNECTING 10 (TUBING) ×3 IMPLANT
TUBING UROLOGY SET (TUBING) ×3 IMPLANT

## 2017-12-08 NOTE — Anesthesia Postprocedure Evaluation (Signed)
Anesthesia Post Note  Patient: Devon Cook  Procedure(s) Performed: BIOPSY TRANSRECTAL ULTRASONIC PROSTATE (TUBP), PERINEAL BIOPSY (N/A ) TRANSURETHRAL RESECTION OF BLADDER TUMOR (TURBT) (N/A ) CYSTOSCOPY WITH RETROGRADE PYELOGRAM/URETERAL STENT PLACEMENT (Bilateral )     Patient location during evaluation: PACU Anesthesia Type: General Level of consciousness: awake and alert Pain management: pain level controlled Vital Signs Assessment: post-procedure vital signs reviewed and stable Respiratory status: spontaneous breathing, nonlabored ventilation, respiratory function stable and patient connected to nasal cannula oxygen Cardiovascular status: blood pressure returned to baseline and stable Postop Assessment: no apparent nausea or vomiting Anesthetic complications: no    Last Vitals:  Vitals:   12/08/17 1630 12/08/17 1650  BP: (!) 145/105 (!) 148/102  Pulse: (!) 108 97  Resp: 19 20  Temp: 36.8 C 37.2 C  SpO2: 96% 97%    Last Pain:  Vitals:   12/08/17 1630  TempSrc:   PainSc: 5                  Mukund Weinreb DAVID

## 2017-12-08 NOTE — Brief Op Note (Signed)
12/08/2017  2:53 PM  PATIENT:  Devon Cook  57 y.o. male  PRE-OPERATIVE DIAGNOSIS:  BLADDER MASS, HYDRONEPHROSIS, VERY ELEVATED PROSTATIC SPECIFIC ANTIGEN  POST-OPERATIVE DIAGNOSIS:  BLADDER MASS, HYDRONEPHROSIS, VERY ELEVATED PROSTATIC SPECIFIC ANTIGEN  PROCEDURE:  Procedure(s): BIOPSY TRANSRECTAL ULTRASONIC PROSTATE (TUBP), PERINEAL BIOPSY (N/A) TRANSURETHRAL RESECTION OF BLADDER TUMOR (TURBT) (N/A) CYSTOSCOPY WITH RETROGRADE PYELOGRAM/URETERAL STENT PLACEMENT (Bilateral)  SURGEON:  Surgeon(s) and Role:    Alexis Frock, MD - Primary  PHYSICIAN ASSISTANT:   ASSISTANTS: none   ANESTHESIA:   general  EBL:  10 mL   BLOOD ADMINISTERED:none  DRAINS: 97F foley to gravity   LOCAL MEDICATIONS USED:  NONE  SPECIMEN:  Source of Specimen:  1 - prostate biopsy cores x 6; 2 - bladder neck tissue  DISPOSITION OF SPECIMEN:  PATHOLOGY  COUNTS:  YES  TOURNIQUET:  * No tourniquets in log *  DICTATION: .Other Dictation: Dictation Number [id number]  PLAN OF CARE: Admit for overnight observation  PATIENT DISPOSITION:  PACU - hemodynamically stable.   Delay start of Pharmacological VTE agent (>24hrs) due to surgical blood loss or risk of bleeding: yes

## 2017-12-08 NOTE — Anesthesia Procedure Notes (Signed)
Procedure Name: LMA Insertion Date/Time: 12/08/2017 1:38 PM Performed by: Lavina Hamman, CRNA Pre-anesthesia Checklist: Patient identified, Emergency Drugs available, Suction available and Patient being monitored Patient Re-evaluated:Patient Re-evaluated prior to induction Oxygen Delivery Method: Circle system utilized Preoxygenation: Pre-oxygenation with 100% oxygen Induction Type: IV induction Ventilation: Mask ventilation without difficulty LMA: LMA inserted and LMA with gastric port inserted LMA Size: 5.0 Number of attempts: 1 Tube secured with: Tape Dental Injury: Teeth and Oropharynx as per pre-operative assessment

## 2017-12-08 NOTE — Discharge Instructions (Signed)
1 - You may have urinary urgency (bladder spasms) and bloody urine on / off with stent in place. This is normal. ° °2 - Call MD or go to ER for fever >102, severe pain / nausea / vomiting not relieved by medications, or acute change in medical status ° °

## 2017-12-08 NOTE — Anesthesia Preprocedure Evaluation (Signed)
Anesthesia Evaluation  Patient identified by MRN, date of birth, ID band Patient awake    Reviewed: Allergy & Precautions, NPO status , Patient's Chart, lab work & pertinent test results  Airway Mallampati: I  TM Distance: >3 FB Neck ROM: Full    Dental   Pulmonary former smoker,    Pulmonary exam normal        Cardiovascular hypertension, Pt. on medications Normal cardiovascular exam     Neuro/Psych    GI/Hepatic (+) Hepatitis -, C  Endo/Other    Renal/GU Renal InsufficiencyRenal disease     Musculoskeletal   Abdominal   Peds  Hematology   Anesthesia Other Findings   Reproductive/Obstetrics                             Anesthesia Physical Anesthesia Plan  ASA: III  Anesthesia Plan: General   Post-op Pain Management:    Induction: Intravenous  PONV Risk Score and Plan: 2 and Ondansetron and Midazolam  Airway Management Planned: LMA  Additional Equipment:   Intra-op Plan:   Post-operative Plan: Extubation in OR  Informed Consent: I have reviewed the patients History and Physical, chart, labs and discussed the procedure including the risks, benefits and alternatives for the proposed anesthesia with the patient or authorized representative who has indicated his/her understanding and acceptance.     Plan Discussed with: CRNA and Surgeon  Anesthesia Plan Comments:         Anesthesia Quick Evaluation

## 2017-12-08 NOTE — Transfer of Care (Signed)
Immediate Anesthesia Transfer of Care Note  Patient: Devon Cook  Procedure(s) Performed: BIOPSY TRANSRECTAL ULTRASONIC PROSTATE (TUBP), PERINEAL BIOPSY (N/A ) TRANSURETHRAL RESECTION OF BLADDER TUMOR (TURBT) (N/A ) CYSTOSCOPY WITH RETROGRADE PYELOGRAM/URETERAL STENT PLACEMENT (Bilateral )  Patient Location: PACU  Anesthesia Type:General  Level of Consciousness: awake, alert  and oriented  Airway & Oxygen Therapy: Patient Spontanous Breathing and Patient connected to face mask oxygen  Post-op Assessment: Report given to RN and Post -op Vital signs reviewed and stable  Post vital signs: Reviewed and stable  Last Vitals:  Vitals Value Taken Time  BP 187/138 12/08/2017  3:01 PM  Temp    Pulse 93 12/08/2017  3:02 PM  Resp 21 12/08/2017  3:02 PM  SpO2 100 % 12/08/2017  3:02 PM  Vitals shown include unvalidated device data.  Last Pain:  Vitals:   12/08/17 1229  TempSrc: Oral  PainSc:          Complications: No apparent anesthesia complications

## 2017-12-08 NOTE — H&P (Signed)
Devon Cook is an 57 y.o. male.    Chief Complaint:   HPI:   1 - Bilateral Hydronephrosis / Acute Renal Failure - Cr 17 by PCP labs on eval malaize 11/2017. Non-con CT with hydro to level of thickened bladder. Bilateral neph tubes placed 11/2017. Foley removed 5/30. Cr now low 4's.   2 - Bladder Thickening / Retroperitoneal Lymphadenopathy / Rule Out Primary Bladder or Other GU Cancer - sigificant bladder trigone thickening by CT 11/2017. Denies h/o gross hematuria but microhematuria noted x several.   OR cysto, bladder biopsy, retrogrades, prostate biopsy, attempt bilateral stents pending    3 - Extremely Elevated PSA / Likely Metastatic Prostate Cancer - Pt's grandfather with prostate cancer. PSA 11/2017 - 787 (extreme elevation). Prostate BX scheduled and pending.    Today "Devon Cook" is seen to proceed with cysto, retrogrades, bladder + prostate biopsy. No interval fevers. neph tubes working well.     Past Medical History:  Diagnosis Date  . Acute renal failure ([place]) 11/14/2017   Hospitalized on May, 14 2019   . Allergy   . Anemia    Hospitalized on Nov 14, 2017  . ED (erectile dysfunction)   . Herpes genitalis in men   . Hypertension 10/2004  . Impaired fasting glucose 10/2004  . Obesity     Past Surgical History:  Procedure Laterality Date  . IR NEPHROSTOMY PLACEMENT LEFT  11/15/2017  . IR NEPHROSTOMY PLACEMENT RIGHT  11/15/2017  . TRICEPS TENDON REPAIR  08/2005    No family history on file. Social History:  reports that he quit smoking about 3 weeks ago. His smoking use included cigarettes. He has never used smokeless tobacco. He reports that he drinks alcohol. He reports that he does not use drugs.  Allergies: No Known Allergies  No medications prior to admission.    Results for orders placed or performed during the hospital encounter of 12/07/17 (from the past 48 hour(s))  CBC     Status: Abnormal   Collection Time: 12/07/17  9:38 AM  Result Value Ref  Range   WBC 12.2 (H) 4.0 - 10.5 K/uL   RBC 4.09 (L) 4.22 - 5.81 MIL/uL   Hemoglobin 10.4 (L) 13.0 - 17.0 g/dL   HCT 31.1 (L) 39.0 - 52.0 %   MCV 76.0 (L) 78.0 - 100.0 fL   MCH 25.4 (L) 26.0 - 34.0 pg   MCHC 33.4 30.0 - 36.0 g/dL   RDW 17.8 (H) 11.5 - 15.5 %   Platelets 391 150 - 400 K/uL    Comment: Performed at [facility], [place] [address], [place], [place] [address]  Comprehensive metabolic panel     Status: Abnormal   Collection Time: 12/07/17  9:38 AM  Result Value Ref Range   Sodium 136 135 - 145 mmol/L   Potassium 5.3 (H) 3.5 - 5.1 mmol/L   Chloride 103 101 - 111 mmol/L   CO2 22 22 - 32 mmol/L   Glucose, Bld 104 (H) 65 - 99 mg/dL   BUN 48 (H) 6 - 20 mg/dL   Creatinine, Ser 4.32 (H) 0.61 - 1.24 mg/dL   Calcium 9.7 8.9 - 10.3 mg/dL   Total Protein 8.4 (H) 6.5 - 8.1 g/dL   Albumin 3.5 3.5 - 5.0 g/dL   AST 22 15 - 41 U/L   ALT 21 17 - 63 U/L   Alkaline Phosphatase 196 (H) 38 - 126 U/L   Total Bilirubin 0.4 0.3 - 1.2 mg/dL  GFR calc non Af Amer 14 (L) >60 mL/min   GFR calc Af Amer 16 (L) >60 mL/min    Comment: (NOTE) The eGFR has been calculated using the CKD EPI equation. This calculation has not been validated in all clinical situations. eGFR's persistently <60 mL/min signify possible Chronic Kidney Disease.    Anion gap 11 5 - 15    Comment: Performed at [facility], [place] [address] [place], [place] [address]   No results found.  Review of Systems  Constitutional: Negative.  Negative for chills and fever.  HENT: Negative.   Eyes: Negative.   Respiratory: Negative.   Cardiovascular: Negative.   Gastrointestinal: Negative.   Genitourinary: Negative.   Musculoskeletal: Negative.   Skin: Negative.   Neurological: Negative.   Endo/Heme/Allergies: Negative.   Psychiatric/Behavioral: Negative.     There were no vitals taken for this visit. Physical Exam  Constitutional: He appears well-developed.  HENT:  Head:  Normocephalic.  Eyes: Pupils are equal, round, and reactive to light.  Neck: Normal range of motion.  Cardiovascular: Normal rate.  Respiratory: Effort normal.  GI: Soft.  Genitourinary:  Genitourinary Comments: bilat neph tubes in place with yellow urine.   Neurological: He is alert.  Skin: Skin is warm.  Psychiatric: He has a normal mood and affect.     Assessment/Plan  Proceed as planned with cysto, retrogrades, attempt bilateral stent, prostate BX. RIsks, benefits, alternatives, expected peri-op course discussed previously and reiterated today.   Alexis Frock, MD 12/08/2017, 7:11 AM

## 2017-12-09 ENCOUNTER — Encounter (HOSPITAL_COMMUNITY): Payer: Self-pay | Admitting: Urology

## 2017-12-09 DIAGNOSIS — C61 Malignant neoplasm of prostate: Secondary | ICD-10-CM | POA: Diagnosis not present

## 2017-12-09 LAB — BASIC METABOLIC PANEL
ANION GAP: 9 (ref 5–15)
BUN: 52 mg/dL — AB (ref 6–20)
CHLORIDE: 101 mmol/L (ref 101–111)
CO2: 23 mmol/L (ref 22–32)
Calcium: 9.1 mg/dL (ref 8.9–10.3)
Creatinine, Ser: 4.98 mg/dL — ABNORMAL HIGH (ref 0.61–1.24)
GFR calc Af Amer: 14 mL/min — ABNORMAL LOW (ref >60)
GFR, EST NON AFRICAN AMERICAN: 12 mL/min — AB (ref >60)
GLUCOSE: 96 mg/dL (ref 65–99)
POTASSIUM: 6 mmol/L — AB (ref 3.5–5.1)
Sodium: 133 mmol/L — ABNORMAL LOW (ref 135–145)

## 2017-12-09 NOTE — Discharge Summary (Addendum)
Date of admission: 12/08/2017  Date of discharge: 12/09/2017  Admission diagnosis:  1.  Bladder mass 2.  Bilateral hydronephrosis secondary to bilateral ureteral orifice obstruction  3.  Elevated PSA 4.  Chronic renal failure   Discharge diagnosis: Same   History and Physical: For full details, please see admission history and physical. Briefly, Devon Cook is a 57 y.o. year old patient with a bladder mass suspicious for urothelial carcinoma and bilateral ureteral orifice obstruction thought to be secondary to locally advanced prostate cancer.  He is status post cystoscopy with TURBT, bilateral JJ stent placement and prostate biopsy with Dr. Tresa Moore on 12/08/2017.  He currently has bilateral nephrostomy tubes that were placed prior to this operation and are currently capped.  Hospital Course: The patient was monitored on the floor postoperatively.  On postop day 1, the patient had clear-yellow urine with more than adequate urine output per Foley catheter, despite his creatinine slightly worsening from 4.32 to 4.98.  He was ambulating without difficulty, tolerating a regular diet and his pain was controlled.  His catheter was removed prior to discharge.  The patient was instructed to place his bilateral nephrostomy tubes to gravity drainage should his urine output diminish, his flank pain worsen and/or if he started to experience worsening nausea/vomiting.  He voices understanding  Physical Exam:  General: Alert and oriented CV: RRR, palpable distal pulses Lungs: CTAB, equal chest rise Abdomen: Soft, NTND, no rebound or guarding GU: Foley catheter in place and draining clear-yellow urine.  Bilateral nephrostomy tubes in place and are capped Ext: NT, No erythema  Laboratory values:  Recent Labs    12/07/17 0938  HGB 10.4*  HCT 31.1*   Recent Labs    12/07/17 0938 12/09/17 0516  CREATININE 4.32* 4.98*    Disposition: Home  Discharge instruction: The patient was instructed to be  ambulatory but told to refrain from heavy lifting, strenuous activity, or driving.  Discharge medications:  Allergies as of 12/09/2017   No Known Allergies     Medication List    TAKE these medications   acetaminophen 325 MG tablet Commonly known as:  TYLENOL Take 2 tablets (650 mg total) by mouth every 6 (six) hours as needed for mild pain (or Fever >/= 101).   allopurinol 100 MG tablet Commonly known as:  ZYLOPRIM Take 1 tablet (100 mg total) by mouth daily.   multivitamin with minerals tablet Take 1 tablet by mouth daily.   oxyCODONE-acetaminophen 5-325 MG tablet Commonly known as:  PERCOCET Take 1-2 tablets by mouth every 6 (six) hours as needed for moderate pain or severe pain. Post-operatively   pantoprazole 40 MG tablet Commonly known as:  PROTONIX Take 1 tablet (40 mg total) by mouth daily.   senna-docusate 8.6-50 MG tablet Commonly known as:  Senokot-S Take 1 tablet by mouth 2 (two) times daily. While taking strong pain meds to prevent constipation   sodium bicarbonate 650 MG tablet Take 2 tablets (1,300 mg total) by mouth 2 (two) times daily.       Followup:  Follow-up Information    Alexis Frock, MD.   Specialty:  Urology Why:  Office will call to arrange MD visit in about 2 weeks. Dr. Tresa Moore will call you with pathology results when available.  Contact information: [place] [id number] [phone]

## 2017-12-09 NOTE — Progress Notes (Signed)
Patient D/C home. Verbalized understanding of all instructions. Denied questions or concerns about instructions. Transferred to lobby by wheelchair.

## 2017-12-09 NOTE — Op Note (Signed)
NAME: Devon Cook, KRONK MEDICAL RECORD ER:[id number] ACCOUNT [id number] DATE OF BIRTH:04/10/1961 FACILITY: [facility] LOCATION: [facility] PHYSICIAN:Ronisha Herringshaw Tresa Moore, MD  OPERATIVE REPORT  DATE OF PROCEDURE:  12/08/2017  PREOPERATIVE DIAGNOSES:  Bilateral hydronephrosis, very elevated PSA, suspect locally advanced prostate cancer.  PROCEDURE PERFORMED: 1.  Transrectal ultrasound with interpretation. 2.  Prostate biopsy. 3.  Transection of bladder mass volume medium. 4.  Bilateral retrograde pyelograms with interpretation. 5.  Insertion of bilateral ureteral stents 7 x 26 Contour, no tether.  ESTIMATED BLOOD LOSS:  Nil.  COMPLICATIONS:  No complications.  DRAINS:  None.  SPECIMENS:  Prostate biopsy cores as follows:  Right mid base, right mid-mid, right mid apex, left base mid, left mid-mid, left mid apex.  Bladder neck tissue, suspect advanced prostate cancer.  FINDINGS: 1.  A very heterogeneous prostate 88 mL by transrectal ultrasound. 2.  Very friable nodular bladder neck tissue invading the trigone consistent with locally advanced prostate cancer. 3.  Successful unroofing of bilateral ureteral orifices with placement of bilateral ureteral stents proximal and renal pelvis, distal end urinary bladder.  INDICATIONS:  The patient is a very pleasant, but quite unfortunate 57 year old man who was found in workup of acute renal failure and malaise to have bilateral hydronephrosis and an incredibly elevated PSA at over 700.  This is consistent with likely  locally advanced metastatic prostate cancer with ureteral obstruction at the level of the bladder.  He was temporized with nephrostomy tubes and his creatinine has now leveled off around 4.  His volume status is good.  He presents now for further  management with a prostate biopsy.  Attempt at ureteral unroofing and stent placement to confirm diagnosis and possibly render him nephrostomy tube free.  Informed consent was obtained and placed in the  medical record.    PROCEDURE IN DETAIL:  The patient being identified, procedure being prostate biopsy, transurethral resection of bladder mass, retrogrades and stents was confirmed.  The procedure time-out was performed, intravenous antibiotics administered.   General  anesthesia induced.  The patient was placed into a low lithotomy position.  A sterile field was created by prepping and draping the patient's perineum.  At this point, a transrectal ultrasound was then performed, which revealed an 88 gram prostate.   There is significant heterogeneity, some median lobe noted.  Next, a 6 cores prostate biopsy was performed as per above with transrectal guidance.  After this, a new sterile field was created by prepping and draping the patient's penis, perineum and  proximal thighs using iodine.  Surgeon changed gown and gloves.  Attention was directed that a cystoscopy was performed using 22-French rigid cystoscope with offset lens.  Inspection of the anterior and posterior urethra revealed some fixation of the  prostate.  It was quite large.  There was a median lobe noted.  There was a very nodular and friable tissue at the area of the bladder neck, completely invading the trigone.  There was none visualization of the ureteral orifices felt to be most likely  consistent with locally advanced prostate cancer.  The cystoscope was then changed to a 26-French resectoscope sheath with visual obturator and using a medium size resectoscope loop, the bladder and tissue resected down appeared to be superficial  fibromuscular stroma.  This was purposely performed in the area to hopefully unroof the ureteral orifices from locally advanced cancer.  There was some suspicion that this was successful at this point.  Next, using the cystoscope again, an angle tipped  KMP catheter and multiple  angulations were performed.  First, the area of the right trigone and amazingly the right ureteral orifice was successfully cannulated  and right retrograde pyelogram was obtained.  Right retrograde pyelogram demonstrated a single right ureter and a single system right kidney.  The in situ nephrostomy tube was noted.  Next, a ZIPwire was advanced in the lower pole in which a new 7 x 26 Contour type stent was placed.  Good proximal  and distal deployment were noted.  Similarly, and also quite fortunately, the left ureteral orifice was able to be recannulated and unroofed.  Estimated left retrograde pyelogram was obtained.  Left retrograde pyelogram demonstrated a single left ureter, single system left kidney, in situ nephrostomy tube noted on the left.  A ZIPwire was advanced to the level of the upper pole of which a new 7 x 26 Contour type stent was placed using  cystoscopic and fluoroscopic guidance.  At this point, we achieved the goals of the surgery today.  The cystoscope was removed and exchanged for a 22-French Foley catheter per urethra to straight drain.  Both nephrostomy tubes were then capped.  He will  be observed overnight to verify he does have adequate antegrade flow of urine with the nephrostomy tubes being capped.  Likely discharged tomorrow.  TN/NUANCE  D:12/08/2017 T:12/09/2017 JOB:[id number]

## 2017-12-13 ENCOUNTER — Telehealth: Payer: Self-pay

## 2017-12-13 NOTE — Telephone Encounter (Signed)
Pt wife was called back to let her know that yet another home health agent does not take husband insurance. Pt was advised to call insurance to see I they could give [place] some direction. [place]

## 2017-12-14 ENCOUNTER — Telehealth: Payer: Self-pay

## 2017-12-14 NOTE — Telephone Encounter (Signed)
Called pt wife to let her know that I have contacted the agency and put in a request. They will call me soon to let me know . [place]

## 2017-12-18 ENCOUNTER — Telehealth: Payer: Self-pay | Admitting: Family Medicine

## 2017-12-18 NOTE — Telephone Encounter (Signed)
Called pt to let him know that intrem was short stafed and that Dr.Lalonde advises that he should return to [facility] to have [facility] check on his bag and cath. [place]

## 2017-12-18 NOTE — Telephone Encounter (Signed)
Pt called and is requesting a phone call he has some questions, pt can be reached at [phone], states it is regarding a procedure he may be having with dr Collene Mares,

## 2017-12-18 NOTE — Telephone Encounter (Signed)
Home health is still not come out to his house.  Call them and raise hell.

## 2017-12-19 DIAGNOSIS — C778 Secondary and unspecified malignant neoplasm of lymph nodes of multiple regions: Secondary | ICD-10-CM | POA: Diagnosis not present

## 2017-12-19 DIAGNOSIS — D631 Anemia in chronic kidney disease: Secondary | ICD-10-CM | POA: Diagnosis not present

## 2017-12-19 DIAGNOSIS — I12 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 stage renal disease: Secondary | ICD-10-CM | POA: Diagnosis not present

## 2017-12-19 DIAGNOSIS — C7951 Secondary malignant neoplasm of bone: Secondary | ICD-10-CM | POA: Diagnosis not present

## 2017-12-19 DIAGNOSIS — C61 Malignant neoplasm of prostate: Secondary | ICD-10-CM | POA: Diagnosis not present

## 2017-12-19 DIAGNOSIS — N179 Acute kidney failure, unspecified: Secondary | ICD-10-CM | POA: Diagnosis not present

## 2017-12-19 DIAGNOSIS — N185 Chronic kidney disease, stage 5: Secondary | ICD-10-CM | POA: Diagnosis not present

## 2017-12-27 ENCOUNTER — Telehealth: Payer: Self-pay | Admitting: Family Medicine

## 2017-12-27 MED ORDER — PANTOPRAZOLE SODIUM 40 MG PO TBEC: 40.0000 mg | DELAYED_RELEASE_TABLET | Freq: Every day | ORAL | 0 refills | Status: DC

## 2017-12-27 MED ORDER — ALLOPURINOL 100 MG PO TABS: 100.0000 mg | ORAL_TABLET | Freq: Every day | ORAL | 0 refills | Status: DC

## 2017-12-27 NOTE — Telephone Encounter (Signed)
I called to the medications in.  He will need a follow-up appointment to review all of his meds.  Tell him to always bring in all of his pills

## 2017-12-27 NOTE — Telephone Encounter (Signed)
Called pt and wife No answer lvm. [place]

## 2017-12-27 NOTE — Telephone Encounter (Signed)
Pt wife called and is stating that moises needs refills on Protonix and Zyloprim and Sodium Bicarbonate 650 mg, states he was on the in the Hospital and didn't know If he needed to contiune taking them he uses [facility] [place] [facility] [place] she can be reached at [phone]

## 2018-01-05 DIAGNOSIS — Z5111 Encounter for antineoplastic chemotherapy: Secondary | ICD-10-CM | POA: Diagnosis not present

## 2018-01-05 DIAGNOSIS — C61 Malignant neoplasm of prostate: Secondary | ICD-10-CM | POA: Diagnosis not present

## 2018-01-11 DIAGNOSIS — N130 Hydronephrosis with ureteropelvic junction obstruction: Secondary | ICD-10-CM | POA: Diagnosis not present

## 2018-01-20 ENCOUNTER — Other Ambulatory Visit: Payer: Self-pay | Admitting: Family Medicine

## 2018-01-22 ENCOUNTER — Other Ambulatory Visit: Payer: Self-pay

## 2018-01-22 ENCOUNTER — Telehealth: Payer: Self-pay

## 2018-01-22 MED ORDER — PANTOPRAZOLE SODIUM 40 MG PO TBEC: 40.0000 mg | DELAYED_RELEASE_TABLET | Freq: Every day | ORAL | 0 refills | Status: DC

## 2018-01-22 MED ORDER — ALLOPURINOL 100 MG PO TABS: 100.0000 mg | ORAL_TABLET | Freq: Every day | ORAL | 0 refills | Status: DC

## 2018-01-22 NOTE — Telephone Encounter (Signed)
Med sent in will call pt and advise that he should get the sodium bicarbonate from org prescriber. [place]

## 2018-01-22 NOTE — Telephone Encounter (Signed)
Renew the Protonix and the Zyloprim.  Concerning the sodium bicarb he would need to check with the doctor prescribed it.

## 2018-01-22 NOTE — Telephone Encounter (Signed)
Pt has called asking for a refill on the following medications. He stated he can not make an appointment because he can't pay the co pay. He stated he's on limited income until he finds another job. Please advise.    1. Protonix 2. Zyloprim 3. Sodium Bicarbonate

## 2018-01-23 ENCOUNTER — Telehealth: Payer: Self-pay | Admitting: Family Medicine

## 2018-01-23 NOTE — Telephone Encounter (Signed)
lets not bother with

## 2018-01-23 NOTE — Telephone Encounter (Signed)
Pt called back & states the Sodium bicarb was prescribed when he was in the hospital and he is not even sure what he's taking it for, he thinks it's for his kidneys.  So he is not able to get the original prescriber to refill it.  So he wants to know if you want him to continue taking it and if so you would need to refill it to [facility] on [address] or he said he can just stop if he doesn't need it.  Please advise.

## 2018-01-24 ENCOUNTER — Telehealth: Payer: Self-pay

## 2018-01-24 NOTE — Telephone Encounter (Signed)
Called pt and he advised me that he is getting the sodium carbonate from his urology office [facility]

## 2018-01-24 NOTE — Telephone Encounter (Signed)
Devon Cook spoke with pt and he is getting this from Urologist

## 2018-02-26 DIAGNOSIS — D631 Anemia in chronic kidney disease: Secondary | ICD-10-CM | POA: Diagnosis not present

## 2018-02-26 DIAGNOSIS — Z7689 Persons encountering health services in other specified circumstances: Secondary | ICD-10-CM | POA: Diagnosis not present

## 2018-02-26 DIAGNOSIS — N185 Chronic kidney disease, stage 5: Secondary | ICD-10-CM | POA: Diagnosis not present

## 2018-03-06 DIAGNOSIS — N185 Chronic kidney disease, stage 5: Secondary | ICD-10-CM | POA: Diagnosis not present

## 2018-03-06 DIAGNOSIS — I12 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 stage renal disease: Secondary | ICD-10-CM | POA: Diagnosis not present

## 2018-03-06 DIAGNOSIS — Z7689 Persons encountering health services in other specified circumstances: Secondary | ICD-10-CM | POA: Diagnosis not present

## 2018-03-06 DIAGNOSIS — D631 Anemia in chronic kidney disease: Secondary | ICD-10-CM | POA: Diagnosis not present

## 2018-03-08 DIAGNOSIS — C61 Malignant neoplasm of prostate: Secondary | ICD-10-CM | POA: Diagnosis not present

## 2018-03-14 ENCOUNTER — Encounter: Payer: 59 | Admitting: Family Medicine

## 2018-03-15 ENCOUNTER — Encounter (HOSPITAL_COMMUNITY): Payer: Self-pay

## 2018-03-15 DIAGNOSIS — N184 Chronic kidney disease, stage 4 (severe): Secondary | ICD-10-CM | POA: Diagnosis not present

## 2018-03-15 DIAGNOSIS — C7951 Secondary malignant neoplasm of bone: Secondary | ICD-10-CM | POA: Diagnosis not present

## 2018-03-15 DIAGNOSIS — C61 Malignant neoplasm of prostate: Secondary | ICD-10-CM | POA: Diagnosis not present

## 2018-03-16 ENCOUNTER — Other Ambulatory Visit (HOSPITAL_COMMUNITY): Payer: Self-pay | Admitting: *Deleted

## 2018-03-19 ENCOUNTER — Other Ambulatory Visit: Payer: Self-pay

## 2018-03-19 ENCOUNTER — Encounter (HOSPITAL_COMMUNITY)
Admission: RE | Admit: 2018-03-19 | Discharge: 2018-03-19 | Disposition: A | Discharge status: Home / Self Care | Payer: 59 | Source: Ambulatory Visit | Attending: Nephrology | Admitting: Nephrology

## 2018-03-19 ENCOUNTER — Emergency Department (HOSPITAL_COMMUNITY)
Admission: EM | Admit: 2018-03-19 | Discharge: 2018-03-20 | Disposition: A | Discharge status: Home / Self Care | Payer: 59 | Source: Home / Self Care | Attending: Emergency Medicine | Admitting: Emergency Medicine

## 2018-03-19 VITALS — BP 144/87 | HR 67 | Temp 98.2°F | Resp 20

## 2018-03-19 DIAGNOSIS — N184 Chronic kidney disease, stage 4 (severe): Secondary | ICD-10-CM

## 2018-03-19 DIAGNOSIS — Z87891 Personal history of nicotine dependence: Secondary | ICD-10-CM

## 2018-03-19 DIAGNOSIS — I129 Hypertensive chronic kidney disease with stage 1 through stage 4 chronic kidney disease, or unspecified chronic kidney disease: Secondary | ICD-10-CM | POA: Insufficient documentation

## 2018-03-19 DIAGNOSIS — D631 Anemia in chronic kidney disease: Secondary | ICD-10-CM | POA: Insufficient documentation

## 2018-03-19 DIAGNOSIS — N179 Acute kidney failure, unspecified: Secondary | ICD-10-CM

## 2018-03-19 DIAGNOSIS — Z79899 Other long term (current) drug therapy: Secondary | ICD-10-CM | POA: Insufficient documentation

## 2018-03-19 DIAGNOSIS — Z8546 Personal history of malignant neoplasm of prostate: Secondary | ICD-10-CM | POA: Insufficient documentation

## 2018-03-19 DIAGNOSIS — N185 Chronic kidney disease, stage 5: Secondary | ICD-10-CM | POA: Insufficient documentation

## 2018-03-19 DIAGNOSIS — D649 Anemia, unspecified: Secondary | ICD-10-CM | POA: Insufficient documentation

## 2018-03-19 LAB — CBC WITH DIFFERENTIAL/PLATELET
Abs Immature Granulocytes: 0.2 10*3/uL — ABNORMAL HIGH (ref 0.0–0.1)
BASOS PCT: 0 %
Basophils Absolute: 0 10*3/uL (ref 0.0–0.1)
EOS ABS: 0.4 10*3/uL (ref 0.0–0.7)
EOS PCT: 3 %
HEMATOCRIT: 21 % — AB (ref 39.0–52.0)
Hemoglobin: 6.7 g/dL — CL (ref 13.0–17.0)
Immature Granulocytes: 1 %
LYMPHS ABS: 2.3 10*3/uL (ref 0.7–4.0)
Lymphocytes Relative: 16 %
MCH: 22.3 pg — AB (ref 26.0–34.0)
MCHC: 31.9 g/dL (ref 30.0–36.0)
MCV: 70 fL — AB (ref 78.0–100.0)
MONOS PCT: 11 %
Monocytes Absolute: 1.5 10*3/uL — ABNORMAL HIGH (ref 0.1–1.0)
NEUTROS PCT: 69 %
Neutro Abs: 10.1 10*3/uL — ABNORMAL HIGH (ref 1.7–7.7)
PLATELETS: 527 10*3/uL — AB (ref 150–400)
RBC: 3 MIL/uL — ABNORMAL LOW (ref 4.22–5.81)
RDW: 17.7 % — AB (ref 11.5–15.5)
WBC: 14.4 10*3/uL — AB (ref 4.0–10.5)

## 2018-03-19 LAB — COMPREHENSIVE METABOLIC PANEL
ALT: 15 U/L (ref 0–44)
ANION GAP: 12 (ref 5–15)
AST: 16 U/L (ref 15–41)
Albumin: 2.6 g/dL — ABNORMAL LOW (ref 3.5–5.0)
Alkaline Phosphatase: 112 U/L (ref 38–126)
BILIRUBIN TOTAL: 0.4 mg/dL (ref 0.3–1.2)
BUN: 34 mg/dL — ABNORMAL HIGH (ref 6–20)
CHLORIDE: 105 mmol/L (ref 98–111)
CO2: 21 mmol/L — ABNORMAL LOW (ref 22–32)
Calcium: 9.1 mg/dL (ref 8.9–10.3)
Creatinine, Ser: 4.78 mg/dL — ABNORMAL HIGH (ref 0.61–1.24)
GFR, EST AFRICAN AMERICAN: 14 mL/min — AB (ref >60)
GFR, EST NON AFRICAN AMERICAN: 12 mL/min — AB (ref >60)
Glucose, Bld: 118 mg/dL — ABNORMAL HIGH (ref 70–99)
POTASSIUM: 3.6 mmol/L (ref 3.5–5.1)
Sodium: 138 mmol/L (ref 135–145)
TOTAL PROTEIN: 7.9 g/dL (ref 6.5–8.1)

## 2018-03-19 LAB — PREPARE RBC (CROSSMATCH)

## 2018-03-19 LAB — POCT HEMOGLOBIN-HEMACUE: HEMOGLOBIN: 6.6 g/dL — AB (ref 13.0–17.0)

## 2018-03-19 MED ORDER — DARBEPOETIN ALFA 200 MCG/0.4ML IJ SOSY
200.0000 ug | PREFILLED_SYRINGE | INTRAMUSCULAR | Status: DC
  Administered 2018-03-19: 200 ug via SUBCUTANEOUS

## 2018-03-19 MED ORDER — DARBEPOETIN ALFA 200 MCG/0.4ML IJ SOSY
PREFILLED_SYRINGE | INTRAMUSCULAR | Status: AC
  Administered 2018-03-19: 200 ug via SUBCUTANEOUS
  Filled 2018-03-19: qty 0.4

## 2018-03-19 MED ORDER — SODIUM CHLORIDE 0.9 % IV SOLN: 10.0000 mL/h | Freq: Once | INTRAVENOUS | Status: DC

## 2018-03-19 MED ORDER — SODIUM CHLORIDE 0.9 % IV SOLN
510.0000 mg | Freq: Once | INTRAVENOUS | Status: AC
  Administered 2018-03-19: 510 mg via INTRAVENOUS
  Filled 2018-03-19: qty 17

## 2018-03-19 NOTE — ED Triage Notes (Signed)
Patient sent by provider for HMG of <7 to obtain blood transfusion.

## 2018-03-19 NOTE — ED Provider Notes (Signed)
Devon Cook is a 57 y.o. male, with a history of HTN and chronic kidney disease, presenting to the ED with fatigue, noted to be anemic during an office visit with hemoglobin of 6.6.    HPI from [place], PA-C: "Patient with history of chronic kidney disease, not on dialysis, anemia, prostate cancer --presents the emergency department with complaint of low hemoglobin.  Patient went to his nephrologist today and had IV iron infusion as well as erythropoietin.  Patient was found to have low hemoglobin of 6.6.  He was called and told to go to the emergency department for transfusion.  Patient reports having some fatigue but denies any chest pain, shortness of breath, lightheadedness or syncope.  He has never had IV iron in the past until today.  Patient states that he has had intermittently dark stools and is having this worked up by his primary care physician.  He denies any current melena or hematochezia.  He states that he might have some blood in his urine due to his chronic kidney disease.  Denies other easy bruising or bleeding."  Past Medical History:  Diagnosis Date  . Acute renal failure ([place]) 11/14/2017   Hospitalized on May, 14 2019   . Allergy   . Anemia    Hospitalized on Nov 14, 2017  . ED (erectile dysfunction)   . Herpes genitalis in men   . Hypertension 10/2004  . Impaired fasting glucose 10/2004  . Obesity     Physical Exam  BP 134/86   Pulse 71   Temp 99.4 F (37.4 C)   Resp 19   Ht 6\' 2"  (1.88 m)   Wt 111.1 kg   SpO2 100%   BMI 31.46 kg/m   Physical Exam  Constitutional: He appears well-developed and well-nourished. No distress.  HENT:  Head: Normocephalic and atraumatic.  Eyes: Conjunctivae are normal.  Neck: Neck supple.  Cardiovascular: Normal rate, regular rhythm, normal heart sounds and intact distal pulses.  Pulmonary/Chest: Effort normal and breath sounds normal. No respiratory distress.  Abdominal: Soft. There is no tenderness. There is no  guarding.  Musculoskeletal: He exhibits no edema.  Lymphadenopathy:    He has no cervical adenopathy.  Neurological: He is alert.  Skin: Skin is warm and dry. He is not diaphoretic.  Psychiatric: He has a normal mood and affect. His behavior is normal.  Nursing note and vitals reviewed.   ED Course/Procedures     Procedures   Abnormal Labs Reviewed  CBC WITH DIFFERENTIAL/PLATELET - Abnormal; Notable for the following components:      Result Value   WBC 14.4 (*)    RBC 3.00 (*)    Hemoglobin 6.7 (*)    HCT 21.0 (*)    MCV 70.0 (*)    MCH 22.3 (*)    RDW 17.7 (*)    Platelets 527 (*)    Neutro Abs 10.1 (*)    Monocytes Absolute 1.5 (*)    Abs Immature Granulocytes 0.2 (*)    All other components within normal limits  COMPREHENSIVE METABOLIC PANEL - Abnormal; Notable for the following components:   CO2 21 (*)    Glucose, Bld 118 (*)    BUN 34 (*)    Creatinine, Ser 4.78 (*)    Albumin 2.6 (*)    GFR calc non Af Amer 12 (*)    GFR calc Af Amer 14 (*)    All other components within normal limits     MDM   Clinical  Course as of Mar 20 621  Tue Mar 20, 2018  0021 Patient states he still "feels fine." No additional symptoms.    [SJ]  [id number] Patient continues to deny additional symptoms.   [SJ]    Clinical Course User Index [SJ] Yelena Metzer, Helane Gunther, PA-C    Took patient care handoff report from [place], [place]. Plan: Patient to receive transfusion of 2 units pRBCs, then discharge.  Patient remained stable throughout ED course.  Transfused without incident.  He will follow-up with his PCP and nephrologist on this matter. Return precautions discussed. Patient voices understanding of these instructions, accepts the plan, and is comfortable with discharge.  Vitals:   03/19/18 1930 03/19/18 1932 03/19/18 2200  BP: (!) 148/103  134/86  Pulse: 87  71  Resp: 18  19  Temp: 99.4 F (37.4 C)    SpO2: 100%  100%  Weight:  111.1 kg   Height:  6\' 2"  (1.88 m)    Vitals:    03/20/18 0500 03/20/18 0515 03/20/18 0530 03/20/18 0545  BP: 140/86 (!) 153/99 (!) 145/92 (!) 151/91  Pulse: 90 75 76 70  Resp: (!) 22 17 (!) 22 17  Temp:      TempSrc:      SpO2: 99% 96% 97% 94%  Weight:      Height:           Layla Maw 03/20/18 0622    Merryl Hacker, MD 03/20/18 0710

## 2018-03-19 NOTE — ED Provider Notes (Addendum)
[facility] EMERGENCY DEPARTMENT Provider Note   CSN: [id number] Arrival date & time: 03/19/18  1812     History   Chief Complaint Chief Complaint  Patient presents with  . Low Hmg    HPI Devon Cook is a 57 y.o. male.  Patient with history of chronic kidney disease, not on dialysis, anemia, prostate cancer --presents the emergency department with complaint of low hemoglobin.  Patient went to his nephrologist today and had IV iron infusion as well as erythropoietin.  Patient was found to have low hemoglobin of 6.6.  He was called and told to go to the emergency department for transfusion.  Patient reports having some fatigue but denies any chest pain, shortness of breath, lightheadedness or syncope.  He has never had IV iron in the past until today.  Patient states that he has had intermittently dark stools and is having this worked up by his primary care physician.  He denies any current melena or hematochezia.  He states that he might have some blood in his urine due to his chronic kidney disease.  Denies other easy bruising or bleeding.     Past Medical History:  Diagnosis Date  . Acute renal failure ([place]) 11/14/2017   Hospitalized on May, 14 2019   . Allergy   . Anemia    Hospitalized on Nov 14, 2017  . ED (erectile dysfunction)   . Herpes genitalis in men   . Hypertension 10/2004  . Impaired fasting glucose 10/2004  . Obesity     Patient Active Problem List   Diagnosis Date Noted  . Hydronephrosis 12/08/2017  . Elevated PSA 12/02/2017  . GI bleed 11/14/2017  . ARF (acute renal failure) ([place]) 11/14/2017  . Acute blood loss anemia 11/14/2017  . History of hepatitis C 11/13/2017  . Benign prostatic hyperplasia with elevated prostate specific antigen (PSA) 11/13/2017  . Hepatitis C antibody test positive 07/15/2013  . ED (erectile dysfunction) 01/09/2012  . Hypertension 01/09/2012  . Morbid obesity with BMI of 40.0-44.9, adult ([place]) 01/09/2012    . Impaired fasting glucose 01/09/2012    Past Surgical History:  Procedure Laterality Date  . CYSTOSCOPY W/ URETERAL STENT PLACEMENT Bilateral 12/08/2017   Procedure: CYSTOSCOPY WITH RETROGRADE PYELOGRAM/URETERAL STENT PLACEMENT;  Surgeon: Alexis Frock, MD;  Location: [facility];  Service: Urology;  Laterality: Bilateral;  . IR NEPHROSTOMY PLACEMENT LEFT  11/15/2017  . IR NEPHROSTOMY PLACEMENT RIGHT  11/15/2017  . PROSTATE BIOPSY N/A 12/08/2017   Procedure: BIOPSY TRANSRECTAL ULTRASONIC PROSTATE (TUBP), PERINEAL BIOPSY;  Surgeon: Alexis Frock, MD;  Location: [facility];  Service: Urology;  Laterality: N/A;  . TRANSURETHRAL RESECTION OF BLADDER TUMOR N/A 12/08/2017   Procedure: TRANSURETHRAL RESECTION OF BLADDER TUMOR (TURBT);  Surgeon: Alexis Frock, MD;  Location: [facility];  Service: Urology;  Laterality: N/A;  . TRICEPS TENDON REPAIR  08/2005        Home Medications    Prior to Admission medications   Medication Sig Start Date End Date Taking? Authorizing Provider  acetaminophen (TYLENOL) 325 MG tablet Take 2 tablets (650 mg total) by mouth every 6 (six) hours as needed for mild pain (or Fever >/= 101). 12/02/17   Domenic Polite, MD  allopurinol (ZYLOPRIM) 100 MG tablet TAKE 1 TABLET BY MOUTH ONCE DAILY 01/22/18   [name], MD  allopurinol (ZYLOPRIM) 100 MG tablet Take 1 tablet (100 mg total) by mouth daily. 01/22/18   [name], MD  Multiple Vitamins-Minerals (MULTIVITAMIN WITH MINERALS) tablet Take  1 tablet by mouth daily.      [provider]  oxyCODONE-acetaminophen (PERCOCET) 5-325 MG tablet Take 1-2 tablets by mouth every 6 (six) hours as needed for moderate pain or severe pain. Post-operatively 12/08/17 12/08/18  Alexis Frock, MD  pantoprazole (PROTONIX) 40 MG tablet TAKE 1 TABLET BY MOUTH ONCE DAILY 01/22/18   [name], MD  pantoprazole (PROTONIX) 40 MG tablet Take 1 tablet (40 mg total) by mouth daily. 01/22/18   [name], MD  senna-docusate (SENOKOT-S)  8.6-50 MG tablet Take 1 tablet by mouth 2 (two) times daily. While taking strong pain meds to prevent constipation 12/08/17   Alexis Frock, MD  sodium bicarbonate 650 MG tablet Take 2 tablets (1,300 mg total) by mouth 2 (two) times daily. 12/02/17   Domenic Polite, MD    Family History No family history on file.  Social History Social History   Tobacco Use  . Smoking status: Former Smoker    Types: Cigarettes    Last attempt to quit: 11/14/2017    Years since quitting: 0.3  . Smokeless tobacco: Never Used  Substance Use Topics  . Alcohol use: Yes    Comment: 2 drinks per weekend  . Drug use: No     Allergies   Patient has no known allergies.   Review of Systems Review of Systems  Constitutional: Positive for fatigue. Negative for fever.  HENT: Negative for rhinorrhea and sore throat.   Eyes: Negative for redness.  Respiratory: Negative for cough.   Cardiovascular: Negative for chest pain.  Gastrointestinal: Negative for abdominal pain, blood in stool, diarrhea, nausea and vomiting.  Genitourinary: Negative for dysuria.  Musculoskeletal: Negative for myalgias.  Skin: Negative for rash.  Neurological: Negative for weakness and headaches.     Physical Exam Updated Vital Signs BP 134/86   Pulse 71   Temp 99.4 F (37.4 C)   Resp 19   Ht 6\' 2"  (1.88 m)   Wt 111.1 kg   SpO2 100%   BMI 31.46 kg/m   Physical Exam  Constitutional: He appears well-developed and well-nourished.  HENT:  Head: Normocephalic and atraumatic.  Eyes: Conjunctivae are normal. Right eye exhibits no discharge. Left eye exhibits no discharge.  Pale conjunctiva  Neck: Normal range of motion. Neck supple.  Cardiovascular: Normal rate, regular rhythm and normal heart sounds.  Pulmonary/Chest: Effort normal and breath sounds normal.  Abdominal: Soft. There is no tenderness.  Neurological: He is alert.  Skin: Skin is warm and dry.  Psychiatric: He has a normal mood and affect.  Nursing note  and vitals reviewed.    ED Treatments / Results  Labs (all labs ordered are listed, but only abnormal results are displayed) Labs Reviewed  CBC WITH DIFFERENTIAL/PLATELET - Abnormal; Notable for the following components:      Result Value   WBC 14.4 (*)    RBC 3.00 (*)    Hemoglobin 6.7 (*)    HCT 21.0 (*)    MCV 70.0 (*)    MCH 22.3 (*)    RDW 17.7 (*)    Platelets 527 (*)    Neutro Abs 10.1 (*)    Monocytes Absolute 1.5 (*)    Abs Immature Granulocytes 0.2 (*)    All other components within normal limits  COMPREHENSIVE METABOLIC PANEL - Abnormal; Notable for the following components:   CO2 21 (*)    Glucose, Bld 118 (*)    BUN 34 (*)    Creatinine, Ser 4.78 (*)  Albumin 2.6 (*)    GFR calc non Af Amer 12 (*)    GFR calc Af Amer 14 (*)    All other components within normal limits  TYPE AND SCREEN  PREPARE RBC (CROSSMATCH)    EKG None  Radiology No results found.  Procedures Procedures (including critical care time)  Medications Ordered in ED Medications  0.9 %  sodium chloride infusion (has no administration in time range)     Initial Impression / Assessment and Plan / ED Course  I have reviewed the triage vital signs and the nursing notes.  Pertinent labs & imaging results that were available during my care of the patient were reviewed by me and considered in my medical decision making (see chart for details).  Clinical Course as of Mar 20 2301  Tue Mar 20, 2018  0021 Patient states he still "feels fine." No additional symptoms.    [SJ]  [id number] Patient continues to deny additional symptoms.   [SJ]    Clinical Course User Index [SJ] Joy, Shawn C, PA-C    Patient seen and examined.  He is here for blood transfusion in setting of chronic kidney disease.  Questionable recent dark stools for which patient is following up with his primary care doctor for consideration of colonoscopy.  Offered evaluation for GI bleeding today and he adamantly declines  rectal exam and states that he is just here to get blood.  Hemoglobin today seems to be stable.  It was 6.6 at his appointment earlier today and 6.7 on recheck in the emergency department.  He is not tachycardic or hypotensive.  I do not suspect acute massive GI bleeding at this time.  Vital signs reviewed and are as follows: BP 134/86   Pulse 71   Temp 99.4 F (37.4 C)   Resp 19   Ht 6\' 2"  (1.88 m)   Wt 111.1 kg   SpO2 100%   BMI 31.46 kg/m   11:32 PM Discussed patient with Dr. Wilson Singer. Will transfuse here and plan on discharge when completed.   Handoff to [place] PA-C at shift change.   Final Clinical Impressions(s) / ED Diagnoses   Final diagnoses:  Low hemoglobin  Stage 4 chronic kidney disease ([place])   Patient sent for transfusion after IV iron today. Some fatigue otherwise asymptomatic. Vitals WNL.   ED Discharge Orders    None       Carlisle Cater, PA-C 03/19/18 2333    Carlisle Cater, PA-C 03/20/18 2302    Virgel Manifold, MD 03/29/18 [phone]

## 2018-03-19 NOTE — Discharge Instructions (Signed)
Darbepoetin Alfa injection °What is this medicine? °DARBEPOETIN ALFA (dar be POE e tin AL fa) helps your body make more red blood cells. It is used to treat anemia caused by chronic kidney failure and chemotherapy. °This medicine may be used for other purposes; ask your health care provider or pharmacist if you have questions. °COMMON BRAND NAME(S): Aranesp °What should I tell my health care provider before I take this medicine? °They need to know if you have any of these conditions: °-blood clotting disorders or history of blood clots °-cancer patient not on chemotherapy °-cystic fibrosis °-heart disease, such as angina, heart failure, or a history of a heart attack °-hemoglobin level of 12 g/dL or greater °-high blood pressure °-low levels of folate, iron, or vitamin B12 °-seizures °-an unusual or allergic reaction to darbepoetin, erythropoietin, albumin, hamster proteins, latex, other medicines, foods, dyes, or preservatives °-pregnant or trying to get pregnant °-breast-feeding °How should I use this medicine? °This medicine is for injection into a vein or under the skin. It is usually given by a health care professional in a hospital or clinic setting. °If you get this medicine at home, you will be taught how to prepare and give this medicine. Use exactly as directed. Take your medicine at regular intervals. Do not take your medicine more often than directed. °It is important that you put your used needles and syringes in a special sharps container. Do not put them in a trash can. If you do not have a sharps container, call your pharmacist or healthcare provider to get one. °A special MedGuide will be given to you by the pharmacist with each prescription and refill. Be sure to read this information carefully each time. °Talk to your pediatrician regarding the use of this medicine in children. While this medicine may be used in children as young as 1 year for selected conditions, precautions do  apply. °Overdosage: If you think you have taken too much of this medicine contact a poison control center or emergency room at once. °NOTE: This medicine is only for you. Do not share this medicine with others. °What if I miss a dose? °If you miss a dose, take it as soon as you can. If it is almost time for your next dose, take only that dose. Do not take double or extra doses. °What may interact with this medicine? °Do not take this medicine with any of the following medications: °-epoetin alfa °This list may not describe all possible interactions. Give your health care provider a list of all the medicines, herbs, non-prescription drugs, or dietary supplements you use. Also tell them if you smoke, drink alcohol, or use illegal drugs. Some items may interact with your medicine. °What should I watch for while using this medicine? °Your condition will be monitored carefully while you are receiving this medicine. °You may need blood work done while you are taking this medicine. °What side effects may I notice from receiving this medicine? °Side effects that you should report to your doctor or health care professional as soon as possible: °-allergic reactions like skin rash, itching or hives, swelling of the face, lips, or tongue °-breathing problems °-changes in vision °-chest pain °-confusion, trouble speaking or understanding °-feeling faint or lightheaded, falls °-high blood pressure °-muscle aches or pains °-pain, swelling, warmth in the leg °-rapid weight gain °-severe headaches °-sudden numbness or weakness of the face, arm or leg °-trouble walking, dizziness, loss of balance or coordination °-seizures (convulsions) °-swelling of the ankles, feet, hands °-  unusually weak or tired °Side effects that usually do not require medical attention (report to your doctor or health care professional if they continue or are bothersome): °-diarrhea °-fever, chills (flu-like symptoms) °-headaches °-nausea, vomiting °-redness,  stinging, or swelling at site where injected °This list may not describe all possible side effects. Call your doctor for medical advice about side effects. You may report side effects to FDA at 1-800-FDA-1088. °Where should I keep my medicine? °Keep out of the reach of children. °Store in a refrigerator between 2 and 8 degrees C (36 and 46 degrees F). Do not freeze. Do not shake. Throw away any unused portion if using a single-dose vial. Throw away any unused medicine after the expiration date. °NOTE: This sheet is a summary. It may not cover all possible information. If you have questions about this medicine, talk to your doctor, pharmacist, or health care provider. °© 2018 Elsevier/Gold Standard (2016-02-08 19:52:26) °Ferumoxytol injection °What is this medicine? °FERUMOXYTOL is an iron complex. Iron is used to make healthy red blood cells, which carry oxygen and nutrients throughout the body. This medicine is used to treat iron deficiency anemia in people with chronic kidney disease. °This medicine may be used for other purposes; ask your health care provider or pharmacist if you have questions. °COMMON BRAND NAME(S): Feraheme °What should I tell my health care provider before I take this medicine? °They need to know if you have any of these conditions: °-anemia not caused by low iron levels °-high levels of iron in the blood °-magnetic resonance imaging (MRI) test scheduled °-an unusual or allergic reaction to iron, other medicines, foods, dyes, or preservatives °-pregnant or trying to get pregnant °-breast-feeding °How should I use this medicine? °This medicine is for injection into a vein. It is given by a health care professional in a hospital or clinic setting. °Talk to your pediatrician regarding the use of this medicine in children. Special care may be needed. °Overdosage: If you think you have taken too much of this medicine contact a poison control center or emergency room at once. °NOTE: This medicine  is only for you. Do not share this medicine with others. °What if I miss a dose? °It is important not to miss your dose. Call your doctor or health care professional if you are unable to keep an appointment. °What may interact with this medicine? °This medicine may interact with the following medications: °-other iron products °This list may not describe all possible interactions. Give your health care provider a list of all the medicines, herbs, non-prescription drugs, or dietary supplements you use. Also tell them if you smoke, drink alcohol, or use illegal drugs. Some items may interact with your medicine. °What should I watch for while using this medicine? °Visit your doctor or healthcare professional regularly. Tell your doctor or healthcare professional if your symptoms do not start to get better or if they get worse. You may need blood work done while you are taking this medicine. °You may need to follow a special diet. Talk to your doctor. Foods that contain iron include: whole grains/cereals, dried fruits, beans, or peas, leafy green vegetables, and organ meats (liver, kidney). °What side effects may I notice from receiving this medicine? °Side effects that you should report to your doctor or health care professional as soon as possible: °-allergic reactions like skin rash, itching or hives, swelling of the face, lips, or tongue °-breathing problems °-changes in blood pressure °-feeling faint or lightheaded, falls °-fever or chills °-flushing,   sweating, or hot feelings °-swelling of the ankles or feet °Side effects that usually do not require medical attention (report to your doctor or health care professional if they continue or are bothersome): °-diarrhea °-headache °-nausea, vomiting °-stomach pain °This list may not describe all possible side effects. Call your doctor for medical advice about side effects. You may report side effects to FDA at 1-800-FDA-1088. °Where should I keep my medicine? °This drug  is given in a hospital or clinic and will not be stored at home. °NOTE: This sheet is a summary. It may not cover all possible information. If you have questions about this medicine, talk to your doctor, pharmacist, or health care provider. °© 2018 Elsevier/Gold Standard (2015-07-23 12:41:49) ° °

## 2018-03-19 NOTE — ED Notes (Signed)
Lab called to report critical, could not take (no nurse first at this time) they called back again and said they got redirected to this number again, gave them charges number. I noted the lab value and notified RN Megan in vertical 3's, only nurse at the time up in triage/pod a.

## 2018-03-19 NOTE — ED Provider Notes (Signed)
MSE was initiated and I personally evaluated the patient and placed orders (if any) at  7:56 PM on March 19, 2018.  The patient appears stable so that the remainder of the MSE may be completed by another provider.  Patient placed in Quick Look pathway, seen and evaluated   Chief Complaint: Abnormal lab  HPI:   Patient is a 13-year male with a history of prostate cancer, GI bleeding, hypertension, and prior blood transfusion presenting for abnormal lab.  Patient reports that his primary medical doctor sent him to the emergency department to receive blood transition because his hemoglobin is less than 7.  He does not know the exact value.  Patient reports that he has had progressive generalized weakness for several weeks, but denies any chest pain, shortness of breath, syncope or presyncope, or palpitations.  Patient does report that his stool has appeared darker than normal, and his upcoming appointment for colonoscopy.  ROS: See HPI (one)  Physical Exam:   Gen: No distress  Neuro: Awake and Alert  Skin: Warm    Focused Exam: Heart regular rate and rhythm.  Lungs clear to auscultation bilaterally.  Abdomen nontender.   Initiation of care has begun. The patient has been counseled on the process, plan, and necessity for staying for the completion/evaluation, and the remainder of the medical screening examination    Tamala Julian 03/19/18 Renaye Rakers, MD 03/20/18 1527

## 2018-03-19 NOTE — Discharge Instructions (Signed)
Please read and follow all provided instructions.  Your diagnoses today include:  1. Low hemoglobin   2. Stage 4 chronic kidney disease ([place])     Tests performed today include:  Blood counts and electrolytes - hemoglobin was 6.8  Kidney function - stable  Vital signs. See below for your results today.   Medications prescribed:   None  Take any prescribed medications only as directed.  Home care instructions:  Follow any educational materials contained in this packet.  BE VERY CAREFUL not to take multiple medicines containing Tylenol (also called acetaminophen). Doing so can lead to an overdose which can damage your liver and cause liver failure and possibly death.   Follow-up instructions: Please follow-up with your primary care provider in the next 3 days for further evaluation of your symptoms.   Return instructions:   Please return to the Emergency Department if you experience worsening symptoms.   Please return if you have any other emergent concerns.  Additional Information:  Your vital signs today were: BP 134/86    Pulse 71    Temp 99.4 F (37.4 C)    Resp 19    Ht 6\' 2"  (1.88 m)    Wt 111.1 kg    SpO2 100%    BMI 31.46 kg/m  If your blood pressure (BP) was elevated above 135/85 this visit, please have this repeated by your doctor within one month. --------------

## 2018-03-20 MED ORDER — LIDOCAINE 5 % EX PTCH
1.0000 | MEDICATED_PATCH | Freq: Once | CUTANEOUS | Status: DC
  Administered 2018-03-20: 1 via TRANSDERMAL
  Filled 2018-03-20: qty 1

## 2018-03-20 MED ORDER — ACETAMINOPHEN 325 MG PO TABS
650.0000 mg | ORAL_TABLET | Freq: Once | ORAL | Status: AC
  Administered 2018-03-20: 650 mg via ORAL
  Filled 2018-03-20: qty 2

## 2018-03-20 NOTE — ED Notes (Signed)
Patient c/o lower back pain states he has chronic back pain and laying on the stretcher isn't helping . Patient repositioned and medication given.

## 2018-03-21 LAB — BPAM RBC
BLOOD PRODUCT EXPIRATION DATE: 201910122359
Blood Product Expiration Date: 201910122359
ISSUE DATE / TIME: 201909170316
ISSUE DATE / TIME: 201909170055
UNIT TYPE AND RH: 7300
Unit Type and Rh: 7300

## 2018-03-21 LAB — TYPE AND SCREEN
ABO/RH(D): B POS
Antibody Screen: NEGATIVE
UNIT DIVISION: 0
Unit division: 0

## 2018-03-22 ENCOUNTER — Ambulatory Visit (INDEPENDENT_AMBULATORY_CARE_PROVIDER_SITE_OTHER): Payer: 59 | Admitting: Family Medicine

## 2018-03-22 ENCOUNTER — Encounter: Payer: Self-pay | Admitting: Family Medicine

## 2018-03-22 VITALS — BP 118/80 | HR 85 | Temp 98.2°F | Wt 239.2 lb

## 2018-03-22 DIAGNOSIS — C61 Malignant neoplasm of prostate: Secondary | ICD-10-CM

## 2018-03-22 DIAGNOSIS — N179 Acute kidney failure, unspecified: Secondary | ICD-10-CM | POA: Diagnosis not present

## 2018-03-22 DIAGNOSIS — D649 Anemia, unspecified: Secondary | ICD-10-CM

## 2018-03-22 DIAGNOSIS — I10 Essential (primary) hypertension: Secondary | ICD-10-CM

## 2018-03-22 NOTE — Progress Notes (Signed)
   Subjective:    Patient ID: Devon Cook, male    DOB: [birth year], 57 y.o.   MRN: [id number]  HPI He is here for follow-up visit.  He was seen in May.  He has had extremely busy several months.  He was diagnosed with prostate cancer as well as acute renal failure with obstruction.  He is seeing urology and on Lupron.  Apparently his PSA has diminished considerably.  He also sees nephrology regularly.  Recently a hemoglobin was done which was 6.6.  He was sent to the emergency room for packed cells.  He also complains of lower abdominal discomfort as well as excessive gas with eructation as well as flatulence.  He states that the lower abdominal discomfort does go away after he has a normal BM.  He states that his BMs are soft but does recognize that his pattern has changed since the surgery.  Review of record indicates she was scheduled for colonoscopy several years ago however he did not follow-up on that.   Review of Systems     Objective:   Physical Exam Alert and resting comfortably.  Abdominal exam shows no masses and only minimal tenderness in the suprapubic area.       Assessment & Plan:  Acute renal failure, unspecified acute renal failure type ([place]) - Plan: CBC with Differential/Platelet  Anemia, unspecified type - Plan: CBC with Differential/Platelet  Prostate cancer [facility])  Essential hypertension He will continue on his present medications.  He will follow-up with urology within the next several months and they are to check with renal concerning next appointment there.  I did do a CBC on him.  Discussed the fact that he did not have a colonoscopy and other the present circumstances he is comfortable with waiting until things quiet down. He is also to get a probiotic also discussed bulk in his diet.  If he continues have difficulty with the abdominal distress, further evaluation will be needed.  He is comfortable with that. Over 25 minutes, greater than 50% spent in  counseling and coordination of care

## 2018-03-22 NOTE — Patient Instructions (Signed)
Get on a probiotic even something like Activia or Dannon yogurt.. Extra fruits and vegetables and and bulk in your diet and if you need to add Metamucil then do that

## 2018-03-23 ENCOUNTER — Other Ambulatory Visit: Payer: Self-pay | Admitting: Urology

## 2018-03-23 LAB — CBC WITH DIFFERENTIAL/PLATELET
Basophils Absolute: 0 10*3/uL (ref 0.0–0.2)
Basos: 0 %
EOS (ABSOLUTE): 0.2 10*3/uL (ref 0.0–0.4)
Eos: 1 %
HEMATOCRIT: 26 % — AB (ref 37.5–51.0)
Hemoglobin: 8.6 g/dL — ABNORMAL LOW (ref 13.0–17.7)
Immature Grans (Abs): 0.1 10*3/uL (ref 0.0–0.1)
Immature Granulocytes: 1 %
Lymphocytes Absolute: 1.9 10*3/uL (ref 0.7–3.1)
Lymphs: 11 %
MCH: 24 pg — ABNORMAL LOW (ref 26.6–33.0)
MCHC: 33.1 g/dL (ref 31.5–35.7)
MCV: 73 fL — ABNORMAL LOW (ref 79–97)
MONOS ABS: 1.9 10*3/uL — AB (ref 0.1–0.9)
Monocytes: 11 %
Neutrophils Absolute: 12.9 10*3/uL — ABNORMAL HIGH (ref 1.4–7.0)
Neutrophils: 76 %
Platelets: 497 10*3/uL — ABNORMAL HIGH (ref 150–450)
RBC: 3.58 x10E6/uL — AB (ref 4.14–5.80)
RDW: 20.9 % — AB (ref 12.3–15.4)
WBC: 17 10*3/uL — AB (ref 3.4–10.8)

## 2018-03-23 LAB — SPECIMEN STATUS REPORT

## 2018-04-05 ENCOUNTER — Telehealth: Payer: Self-pay | Admitting: Family Medicine

## 2018-04-05 NOTE — Telephone Encounter (Signed)
Pt's wife, Pamala Hurry, called stating that pt has a procedure schedule with urologist on 10/18 to have stents replaced and pt also has an appointment scheduled to see Dr. Redmond School on 10/18. Does Dr. Redmond School want pt to reschedule his 10/18 appt with Dr. Redmond School so he can see him after the procedure? Call wife back at [phone]

## 2018-04-05 NOTE — Telephone Encounter (Signed)
Yes, definitely reschedule him for a week or 2 after that

## 2018-04-06 NOTE — Telephone Encounter (Signed)
rescheduled

## 2018-04-13 ENCOUNTER — Encounter (HOSPITAL_COMMUNITY): Payer: Self-pay

## 2018-04-16 ENCOUNTER — Ambulatory Visit (HOSPITAL_COMMUNITY)
Admission: RE | Admit: 2018-04-16 | Discharge: 2018-04-16 | Disposition: A | Discharge status: Home / Self Care | Payer: 59 | Source: Ambulatory Visit | Attending: Nephrology | Admitting: Nephrology

## 2018-04-16 ENCOUNTER — Encounter (HOSPITAL_BASED_OUTPATIENT_CLINIC_OR_DEPARTMENT_OTHER): Payer: Self-pay | Admitting: *Deleted

## 2018-04-16 VITALS — BP 131/94 | HR 87 | Temp 99.4°F | Resp 16

## 2018-04-16 DIAGNOSIS — N185 Chronic kidney disease, stage 5: Secondary | ICD-10-CM | POA: Diagnosis not present

## 2018-04-16 DIAGNOSIS — N179 Acute kidney failure, unspecified: Secondary | ICD-10-CM | POA: Insufficient documentation

## 2018-04-16 DIAGNOSIS — D631 Anemia in chronic kidney disease: Secondary | ICD-10-CM | POA: Diagnosis not present

## 2018-04-16 LAB — POCT HEMOGLOBIN-HEMACUE: HEMOGLOBIN: 9.8 g/dL — AB (ref 13.0–17.0)

## 2018-04-16 LAB — IRON AND TIBC
Iron: 28 ug/dL — ABNORMAL LOW (ref 45–182)
SATURATION RATIOS: 13 % — AB (ref 17.9–39.5)
TIBC: 223 ug/dL — AB (ref 250–450)
UIBC: 195 ug/dL

## 2018-04-16 LAB — FERRITIN: Ferritin: 918 ng/mL — ABNORMAL HIGH (ref 24–336)

## 2018-04-16 MED ORDER — DARBEPOETIN ALFA 200 MCG/0.4ML IJ SOSY
200.0000 ug | PREFILLED_SYRINGE | INTRAMUSCULAR | Status: DC
  Administered 2018-04-16: 200 ug via SUBCUTANEOUS

## 2018-04-16 MED ORDER — DARBEPOETIN ALFA 200 MCG/0.4ML IJ SOSY
PREFILLED_SYRINGE | INTRAMUSCULAR | Status: AC
  Administered 2018-04-16: 200 ug via SUBCUTANEOUS
  Filled 2018-04-16: qty 0.4

## 2018-04-17 ENCOUNTER — Encounter (HOSPITAL_BASED_OUTPATIENT_CLINIC_OR_DEPARTMENT_OTHER): Payer: Self-pay | Admitting: *Deleted

## 2018-04-17 ENCOUNTER — Other Ambulatory Visit: Payer: Self-pay

## 2018-04-17 NOTE — Progress Notes (Signed)
Spoke with preciliano Npo after midnight arrive 830 am 04-20-18 [facility] med[facility] to take sip of water: allopurinol, pantaprazole, sodium bicarbonate Records on chart/epic hemaglobin , ferritin, iron and tibs 04-16-18, cbc with dif 03-22-18, chest xray 11-25-17, ekg 12-07-17 . Wife Pamala Hurry driver cell [phone] Surgery orders in epic

## 2018-04-20 ENCOUNTER — Encounter (HOSPITAL_BASED_OUTPATIENT_CLINIC_OR_DEPARTMENT_OTHER): Payer: Self-pay

## 2018-04-20 ENCOUNTER — Ambulatory Visit (HOSPITAL_BASED_OUTPATIENT_CLINIC_OR_DEPARTMENT_OTHER)
Admission: RE | Admit: 2018-04-20 | Discharge: 2018-04-20 | Disposition: A | Discharge status: Home / Self Care | Payer: 59 | Source: Ambulatory Visit | Attending: Urology | Admitting: Urology

## 2018-04-20 ENCOUNTER — Ambulatory Visit: Payer: 59 | Admitting: Family Medicine

## 2018-04-20 ENCOUNTER — Ambulatory Visit (HOSPITAL_BASED_OUTPATIENT_CLINIC_OR_DEPARTMENT_OTHER): Payer: 59 | Admitting: Certified Registered"

## 2018-04-20 ENCOUNTER — Encounter (HOSPITAL_BASED_OUTPATIENT_CLINIC_OR_DEPARTMENT_OTHER)
Admission: RE | Disposition: A | Discharge status: Home / Self Care | Payer: Self-pay | Source: Ambulatory Visit | Attending: Urology

## 2018-04-20 DIAGNOSIS — Z87891 Personal history of nicotine dependence: Secondary | ICD-10-CM | POA: Diagnosis not present

## 2018-04-20 DIAGNOSIS — A6000 Herpesviral infection of urogenital system, unspecified: Secondary | ICD-10-CM | POA: Insufficient documentation

## 2018-04-20 DIAGNOSIS — C7951 Secondary malignant neoplasm of bone: Secondary | ICD-10-CM | POA: Diagnosis not present

## 2018-04-20 DIAGNOSIS — N131 Hydronephrosis with ureteral stricture, not elsewhere classified: Secondary | ICD-10-CM | POA: Insufficient documentation

## 2018-04-20 DIAGNOSIS — E1122 Type 2 diabetes mellitus with diabetic chronic kidney disease: Secondary | ICD-10-CM | POA: Insufficient documentation

## 2018-04-20 DIAGNOSIS — Z466 Encounter for fitting and adjustment of urinary device: Secondary | ICD-10-CM | POA: Diagnosis not present

## 2018-04-20 DIAGNOSIS — N135 Crossing vessel and stricture of ureter without hydronephrosis: Secondary | ICD-10-CM | POA: Diagnosis not present

## 2018-04-20 DIAGNOSIS — N402 Nodular prostate without lower urinary tract symptoms: Secondary | ICD-10-CM | POA: Insufficient documentation

## 2018-04-20 DIAGNOSIS — N189 Chronic kidney disease, unspecified: Secondary | ICD-10-CM | POA: Diagnosis not present

## 2018-04-20 DIAGNOSIS — N1330 Unspecified hydronephrosis: Secondary | ICD-10-CM | POA: Diagnosis not present

## 2018-04-20 DIAGNOSIS — Z79899 Other long term (current) drug therapy: Secondary | ICD-10-CM | POA: Diagnosis not present

## 2018-04-20 DIAGNOSIS — K219 Gastro-esophageal reflux disease without esophagitis: Secondary | ICD-10-CM | POA: Insufficient documentation

## 2018-04-20 DIAGNOSIS — C61 Malignant neoplasm of prostate: Secondary | ICD-10-CM | POA: Insufficient documentation

## 2018-04-20 HISTORY — PX: CYSTOSCOPY W/ URETERAL STENT PLACEMENT: SHX1429

## 2018-04-20 LAB — POCT I-STAT 4, (NA,K, GLUC, HGB,HCT)
Glucose, Bld: 105 mg/dL — ABNORMAL HIGH (ref 70–99)
HEMATOCRIT: 33 % — AB (ref 39.0–52.0)
Hemoglobin: 11.2 g/dL — ABNORMAL LOW (ref 13.0–17.0)
Potassium: 4.2 mmol/L (ref 3.5–5.1)
SODIUM: 138 mmol/L (ref 135–145)

## 2018-04-20 SURGERY — CYSTOSCOPY, FLEXIBLE, WITH STENT REPLACEMENT
Anesthesia: General | Laterality: Bilateral

## 2018-04-20 MED ORDER — LIDOCAINE 2% (20 MG/ML) 5 ML SYRINGE
INTRAMUSCULAR | Status: DC | PRN
  Administered 2018-04-20: 100 mg via INTRAVENOUS

## 2018-04-20 MED ORDER — DEXAMETHASONE SODIUM PHOSPHATE 10 MG/ML IJ SOLN
INTRAMUSCULAR | Status: DC | PRN
  Administered 2018-04-20: 10 mg via INTRAVENOUS

## 2018-04-20 MED ORDER — LIDOCAINE 2% (20 MG/ML) 5 ML SYRINGE
INTRAMUSCULAR | Status: AC
  Filled 2018-04-20: qty 5

## 2018-04-20 MED ORDER — SODIUM CHLORIDE 0.9 % IV SOLN
1.0000 g | INTRAVENOUS | Status: AC
  Administered 2018-04-20: 1 g via INTRAVENOUS
  Filled 2018-04-20: qty 10

## 2018-04-20 MED ORDER — FENTANYL CITRATE (PF) 100 MCG/2ML IJ SOLN
INTRAMUSCULAR | Status: AC
  Filled 2018-04-20: qty 2

## 2018-04-20 MED ORDER — FENTANYL CITRATE (PF) 100 MCG/2ML IJ SOLN
INTRAMUSCULAR | Status: DC | PRN
  Administered 2018-04-20: 50 ug via INTRAVENOUS

## 2018-04-20 MED ORDER — OXYCODONE HCL 5 MG/5ML PO SOLN
5.0000 mg | Freq: Once | ORAL | Status: DC | PRN
  Filled 2018-04-20: qty 5

## 2018-04-20 MED ORDER — KETOROLAC TROMETHAMINE 30 MG/ML IJ SOLN
INTRAMUSCULAR | Status: AC
  Filled 2018-04-20: qty 1

## 2018-04-20 MED ORDER — KETOROLAC TROMETHAMINE 30 MG/ML IJ SOLN
30.0000 mg | Freq: Once | INTRAMUSCULAR | Status: DC | PRN
  Filled 2018-04-20: qty 1

## 2018-04-20 MED ORDER — FENTANYL CITRATE (PF) 100 MCG/2ML IJ SOLN
25.0000 ug | INTRAMUSCULAR | Status: DC | PRN
  Filled 2018-04-20: qty 1

## 2018-04-20 MED ORDER — TRAMADOL HCL 50 MG PO TABS: 50.0000 mg | ORAL_TABLET | Freq: Four times a day (QID) | ORAL | 0 refills | Status: DC | PRN

## 2018-04-20 MED ORDER — SODIUM CHLORIDE 0.9 % IV SOLN
INTRAVENOUS | Status: DC
  Administered 2018-04-20: 09:00:00 via INTRAVENOUS
  Filled 2018-04-20: qty 1000

## 2018-04-20 MED ORDER — MIDAZOLAM HCL 2 MG/2ML IJ SOLN
INTRAMUSCULAR | Status: DC | PRN
  Administered 2018-04-20: 2 mg via INTRAVENOUS

## 2018-04-20 MED ORDER — PROPOFOL 10 MG/ML IV BOLUS
INTRAVENOUS | Status: AC
  Filled 2018-04-20: qty 20

## 2018-04-20 MED ORDER — ACETAMINOPHEN 325 MG PO TABS
325.0000 mg | ORAL_TABLET | ORAL | Status: DC | PRN
  Filled 2018-04-20: qty 2

## 2018-04-20 MED ORDER — PROPOFOL 10 MG/ML IV BOLUS
INTRAVENOUS | Status: DC | PRN
  Administered 2018-04-20: 150 mg via INTRAVENOUS

## 2018-04-20 MED ORDER — CEFTRIAXONE SODIUM 1 G IJ SOLR
INTRAMUSCULAR | Status: AC
  Filled 2018-04-20: qty 10

## 2018-04-20 MED ORDER — SODIUM CHLORIDE 0.9 % IR SOLN
Status: DC | PRN
  Administered 2018-04-20: 3000 mL via INTRAVESICAL

## 2018-04-20 MED ORDER — ONDANSETRON HCL 4 MG/2ML IJ SOLN
4.0000 mg | Freq: Once | INTRAMUSCULAR | Status: DC | PRN
  Filled 2018-04-20: qty 2

## 2018-04-20 MED ORDER — ACETAMINOPHEN 160 MG/5ML PO SOLN
325.0000 mg | ORAL | Status: DC | PRN
  Filled 2018-04-20: qty 20.3

## 2018-04-20 MED ORDER — MEPERIDINE HCL 25 MG/ML IJ SOLN
6.2500 mg | INTRAMUSCULAR | Status: DC | PRN
  Filled 2018-04-20: qty 1

## 2018-04-20 MED ORDER — SODIUM CHLORIDE 0.9 % IV SOLN
INTRAVENOUS | Status: AC
  Filled 2018-04-20: qty 100

## 2018-04-20 MED ORDER — ONDANSETRON HCL 4 MG/2ML IJ SOLN
INTRAMUSCULAR | Status: AC
  Filled 2018-04-20: qty 2

## 2018-04-20 MED ORDER — MIDAZOLAM HCL 2 MG/2ML IJ SOLN
INTRAMUSCULAR | Status: AC
  Filled 2018-04-20: qty 2

## 2018-04-20 MED ORDER — OXYCODONE HCL 5 MG PO TABS
5.0000 mg | ORAL_TABLET | Freq: Once | ORAL | Status: DC | PRN
  Filled 2018-04-20: qty 1

## 2018-04-20 MED ORDER — DEXAMETHASONE SODIUM PHOSPHATE 10 MG/ML IJ SOLN
INTRAMUSCULAR | Status: AC
  Filled 2018-04-20: qty 1

## 2018-04-20 SURGICAL SUPPLY — 24 items
BAG DRAIN URO-CYSTO SKYTR STRL (DRAIN) ×3 IMPLANT
BASKET LASER NITINOL 1.9FR (BASKET) IMPLANT
CATH INTERMIT  6FR 70CM (CATHETERS) ×3 IMPLANT
CLOTH BEACON ORANGE TIMEOUT ST (SAFETY) ×3 IMPLANT
FIBER LASER FLEXIVA 365 (UROLOGICAL SUPPLIES) IMPLANT
FIBER LASER TRAC TIP (UROLOGICAL SUPPLIES) IMPLANT
GLOVE BIO SURGEON STRL SZ7.5 (GLOVE) ×3 IMPLANT
GOWN STRL REUS W/TWL LRG LVL3 (GOWN DISPOSABLE) ×3 IMPLANT
GUIDEWIRE ANG ZIPWIRE 038X150 (WIRE) IMPLANT
GUIDEWIRE STR DUAL SENSOR (WIRE) ×3 IMPLANT
INFUSOR MANOMETER BAG 3000ML (MISCELLANEOUS) IMPLANT
IV NS 1000ML (IV SOLUTION) ×2
IV NS 1000ML BAXH (IV SOLUTION) ×1 IMPLANT
IV NS IRRIG 3000ML ARTHROMATIC (IV SOLUTION) ×3 IMPLANT
KIT TURNOVER CYSTO (KITS) ×3 IMPLANT
MANIFOLD NEPTUNE II (INSTRUMENTS) ×3 IMPLANT
NS IRRIG 500ML POUR BTL (IV SOLUTION) ×6 IMPLANT
PACK CYSTO (CUSTOM PROCEDURE TRAY) ×3 IMPLANT
STENT CONTOUR 7FRX26 (STENTS) ×6 IMPLANT
SYR 10ML LL (SYRINGE) ×3 IMPLANT
TUBE CONNECTING 12'X1/4 (SUCTIONS)
TUBE CONNECTING 12X1/4 (SUCTIONS) IMPLANT
TUBE FEEDING 8FR 16IN STR KANG (MISCELLANEOUS) IMPLANT
TUBING UROLOGY SET (TUBING) IMPLANT

## 2018-04-20 NOTE — Transfer of Care (Signed)
Immediate Anesthesia Transfer of Care Note  Patient: Devon Cook  Procedure(s) Performed: Procedure(s) (LRB): CYSTOSCOPY WITH STENT REPLACEMENT (Bilateral)  Patient Location: PACU  Anesthesia Type: General  Level of Consciousness: awake, oriented, sedated and patient cooperative  Airway & Oxygen Therapy: Patient Spontanous Breathing and Patient connected to face mask oxygen  Post-op Assessment: Report given to PACU RN and Post -op Vital signs reviewed and stable  Post vital signs: Reviewed and stable  Complications: No apparent anesthesia complications  Last Vitals:  Vitals Value Taken Time  BP 117/69 04/20/2018 10:15 AM  Temp    Pulse 74 04/20/2018 10:16 AM  Resp 22 04/20/2018 10:16 AM  SpO2 100 % 04/20/2018 10:16 AM  Vitals shown include unvalidated device data.  Last Pain:  Vitals:   04/20/18 0906  TempSrc:   PainSc: 0-No pain      Patients Stated Pain Goal: 4 (04/20/18 0906)

## 2018-04-20 NOTE — Brief Op Note (Signed)
04/20/2018  10:06 AM  PATIENT:  Devon Cook  57 y.o. male  PRE-OPERATIVE DIAGNOSIS:  BILATERAL URETERAL OBSTRUCTION  POST-OPERATIVE DIAGNOSIS:  BILATERAL URETERAL OBSTRUCTION  PROCEDURE:  Procedure(s): CYSTOSCOPY WITH STENT REPLACEMENT (Bilateral)  SURGEON:  Surgeon(s) and Role:    * Alexis Frock, MD - Primary  PHYSICIAN ASSISTANT:   ASSISTANTS: none   ANESTHESIA:   general  EBL:  minimal   BLOOD ADMINISTERED:none  DRAINS: none   LOCAL MEDICATIONS USED:  NONE  SPECIMEN:  Source of Specimen:  bilateral in situ stents  DISPOSITION OF SPECIMEN:  discard  COUNTS:  YES  TOURNIQUET:  * No tourniquets in log *  DICTATION: .Other Dictation: Dictation Number [id number]  PLAN OF CARE: Discharge to home after PACU  PATIENT DISPOSITION:  PACU - hemodynamically stable.   Delay start of Pharmacological VTE agent (>24hrs) due to surgical blood loss or risk of bleeding: not applicable

## 2018-04-20 NOTE — Anesthesia Preprocedure Evaluation (Signed)
Anesthesia Evaluation  Patient identified by MRN, date of birth, ID band Patient awake    Reviewed: Allergy & Precautions, NPO status , Patient's Chart, lab work & pertinent test results  Airway Mallampati: I       Dental no notable dental hx. (+) Teeth Intact   Pulmonary former smoker,    Pulmonary exam normal breath sounds clear to auscultation       Cardiovascular hypertension, Normal cardiovascular exam Rhythm:Regular Rate:Normal     Neuro/Psych negative psych ROS   GI/Hepatic GERD  Medicated,  Endo/Other    Renal/GU   negative genitourinary   Musculoskeletal negative musculoskeletal ROS (+)   Abdominal Normal abdominal exam  (+)   Peds  Hematology   Anesthesia Other Findings   Reproductive/Obstetrics                             Anesthesia Physical Anesthesia Plan  ASA: II  Anesthesia Plan: General   Post-op Pain Management:    Induction: Intravenous  PONV Risk Score and Plan: 4 or greater and Ondansetron, Dexamethasone, Midazolam and Scopolamine patch - Pre-op  Airway Management Planned: LMA  Additional Equipment:   Intra-op Plan:   Post-operative Plan: Extubation in OR  Informed Consent: I have reviewed the patients History and Physical, chart, labs and discussed the procedure including the risks, benefits and alternatives for the proposed anesthesia with the patient or authorized representative who has indicated his/her understanding and acceptance.   Dental advisory given  Plan Discussed with: CRNA  Anesthesia Plan Comments:         Anesthesia Quick Evaluation

## 2018-04-20 NOTE — Anesthesia Postprocedure Evaluation (Signed)
Anesthesia Post Note  Patient: Devon Cook  Procedure(s) Performed: CYSTOSCOPY WITH STENT REPLACEMENT (Bilateral )     Patient location during evaluation: PACU Anesthesia Type: General Level of consciousness: awake Pain management: pain level controlled Vital Signs Assessment: post-procedure vital signs reviewed and stable Respiratory status: spontaneous breathing Cardiovascular status: stable Postop Assessment: no apparent nausea or vomiting Anesthetic complications: no    Last Vitals:  Vitals:   04/20/18 1015 04/20/18 1030  BP: 117/69 123/86  Pulse: 79 68  Resp: (!) 9 12  Temp: 36.5 C   SpO2: 99% 100%    Last Pain:  Vitals:   04/20/18 1030  TempSrc:   PainSc: 0-No pain   Pain Goal: Patients Stated Pain Goal: 4 (04/20/18 0906)               Joron Velis JR,JOHN Mateo Flow

## 2018-04-20 NOTE — H&P (Signed)
Devon Cook is an 57 y.o. male.    Chief Complaint: Pre-OP Bilateral stent exchange.   HPI:   1 - Malignant Ureteral Obstruction - Cr 17 by PCP labs on eval malaize 11/2017. Non-con CT with hydro to level of thickened bladder. Bilateral neph tubes placed 11/2017 and converted to bilateral 7x26 stents 12/2017 in OR.    2 - Metastatic Prostate Cancer - PSA 787 11/2017 with CT with pelvic lymphadenopathy (non-bulky) and small L3 vertebral met c/w metastatic prostate cancer. OR BX 12/2017 confirms Gleason 9 and 10 disease.   Recent Course:  01/2018 Lupron '45mg'$ ; 03/2018 - PSA 0.26 / T 15   3- Chronic Renal Insufficiency - Cr 4's after bilateral renal decompression c/w permanent renal damage. Now established with Dr. Lorrene Reid at [facility].   PMH sig for pre-DM, obesity. NO isschemic CV disease / blood thinners.   Today "[name]" is seen to proceed with bilateral stent exchange for malignant hydro. No interval fevers.      Past Medical History:  Diagnosis Date  . Acute renal failure ([place]) 11/14/2017   Hospitalized on May, 14 2019   . Allergy   . Anemia    Hospitalized on Nov 14, 2017  . ED (erectile dysfunction)   . Herpes genitalis in men   . Hypertension 10/2004   off all meds since Nov 14, 2017  . Impaired fasting glucose 10/2004  . Obesity     Past Surgical History:  Procedure Laterality Date  . CYSTOSCOPY W/ URETERAL STENT PLACEMENT Bilateral 12/08/2017   Procedure: CYSTOSCOPY WITH RETROGRADE PYELOGRAM/URETERAL STENT PLACEMENT;  Surgeon: Alexis Frock, MD;  Location: [facility];  Service: Urology;  Laterality: Bilateral;  . IR NEPHROSTOMY PLACEMENT LEFT  11/15/2017  . IR NEPHROSTOMY PLACEMENT RIGHT  11/15/2017  . PROSTATE BIOPSY N/A 12/08/2017   Procedure: BIOPSY TRANSRECTAL ULTRASONIC PROSTATE (TUBP), PERINEAL BIOPSY;  Surgeon: Alexis Frock, MD;  Location: [facility];  Service: Urology;  Laterality: N/A;  . TRANSURETHRAL RESECTION OF BLADDER TUMOR N/A 12/08/2017   Procedure:  TRANSURETHRAL RESECTION OF BLADDER TUMOR (TURBT);  Surgeon: Alexis Frock, MD;  Location: [facility];  Service: Urology;  Laterality: N/A;  . TRICEPS TENDON REPAIR Bilateral 08/2005    History reviewed. No pertinent family history. Social History:  reports that he quit smoking about 5 months ago. His smoking use included cigarettes and pipe. He has a 3.00 pack-year smoking history. He has never used smokeless tobacco. He reports that he drinks alcohol. He reports that he does not use drugs.  Allergies: No Known Allergies  No medications prior to admission.    No results found for this or any previous visit (from the past 48 hour(s)). No results found.  Review of Systems  Constitutional: Negative.  Negative for chills and fever.  HENT: Negative.   Eyes: Negative.   Respiratory: Negative.   Cardiovascular: Negative.   Gastrointestinal: Negative.   Genitourinary: Positive for frequency and urgency.  Musculoskeletal: Negative.   Skin: Negative.   Neurological: Negative.   Endo/Heme/Allergies: Negative.   Psychiatric/Behavioral: Negative.     Height '6\' 2"'$  (1.88 m), weight 108.9 kg. Physical Exam  Constitutional: He appears well-developed.  HENT:  Head: Normocephalic.  Eyes: Pupils are equal, round, and reactive to light.  Neck: Normal range of motion.  Cardiovascular: Normal rate.  Respiratory: Effort normal.  GI: Soft.  Genitourinary:  Genitourinary Comments: No CVAT at present.   Musculoskeletal: Normal range of motion.  Neurological: He is alert.  Skin: Skin is warm.  Psychiatric:  He has a normal mood and affect.     Assessment/Plan  Proceed as planned with BILATERAL ureteral stent exchange for renal decompression in setting of malignant obstrution. Risks, benefits, alternatives, expected peri-op course discussed previously and reiterated today.    Alexis Frock, MD 04/20/2018, 5:29 AM

## 2018-04-20 NOTE — Progress Notes (Signed)
Patient's b/p 148/95, Dr. Tresa Moore notified and in room to see patient and wife. Stated patient may go home. Other VSS.

## 2018-04-20 NOTE — Anesthesia Procedure Notes (Signed)
Procedure Name: LMA Insertion Date/Time: 04/20/2018 9:47 AM Performed by: Suan Halter, CRNA Pre-anesthesia Checklist: Patient identified, Emergency Drugs available, Suction available and Patient being monitored Patient Re-evaluated:Patient Re-evaluated prior to induction Oxygen Delivery Method: Circle system utilized Preoxygenation: Pre-oxygenation with 100% oxygen Induction Type: IV induction Ventilation: Mask ventilation without difficulty LMA: LMA inserted LMA Size: 5.0 Number of attempts: 1 Airway Equipment and Method: Bite block Placement Confirmation: positive ETCO2 Tube secured with: Tape Dental Injury: Teeth and Oropharynx as per pre-operative assessment

## 2018-04-20 NOTE — Discharge Instructions (Signed)
1 - You may have urinary urgency (bladder spasms) and bloody urine on / off with stent in place. This is normal.  2 - Call MD or go to ER for fever >102, severe pain / nausea / vomiting not relieved by medications, or acute change in medical status  Post Anesthesia Home Care Instructions  Activity: Get plenty of rest for the remainder of the day. A responsible individual must stay with you for 24 hours following the procedure.  For the next 24 hours, DO NOT: -Drive a car -Operate machinery -Drink alcoholic beverages -Take any medication unless instructed by your physician -Make any legal decisions or sign important papers.  Meals: Start with liquid foods such as gelatin or soup. Progress to regular foods as tolerated. Avoid greasy, spicy, heavy foods. If nausea and/or vomiting occur, drink only clear liquids until the nausea and/or vomiting subsides. Call your physician if vomiting continues.  Special Instructions/Symptoms: Your throat may feel dry or sore from the anesthesia or the breathing tube placed in your throat during surgery. If this causes discomfort, gargle with warm salt water. The discomfort should disappear within 24 hours.  If you had a scopolamine patch placed behind your ear for the management of post- operative nausea and/or vomiting:  1. The medication in the patch is effective for 72 hours, after which it should be removed.  Wrap patch in a tissue and discard in the trash. Wash hands thoroughly with soap and water. 2. You may remove the patch earlier than 72 hours if you experience unpleasant side effects which may include dry mouth, dizziness or visual disturbances. 3. Avoid touching the patch. Wash your hands with soap and water after contact with the patch.     

## 2018-04-20 NOTE — Op Note (Signed)
NAME: Devon Cook, Devon Cook MEDICAL RECORD DX:[id number] ACCOUNT [id number] DATE OF BIRTH:18-Dec-1960 FACILITY: [facility] LOCATION: [facility] PHYSICIAN:Gustave Lindeman Tresa Moore, MD  OPERATIVE REPORT  DATE OF PROCEDURE:  04/20/2018  PREOPERATIVE DIAGNOSIS:  Bilateral malignant obstruction from metastatic prostate cancer.  PROCEDURE: 1.  Cystoscopy, bilateral pyelograms, interpretation. 2.  Exchange of bilateral ureteral stents 7 x 26 Contour, no tether.  ESTIMATED BLOOD LOSS:  Nil.  COMPLICATIONS:  None.  SPECIMENS:  Bilateral ureteral stents for discard.  FINDINGS: 1.  Significantly improved malignant obstruction, decreased volume of nodular prostate and hydronephrosis. 2.  Successful replacement of bilateral ureteral stents proximal and renal pelvis, distally in the bladder.  INDICATIONS:  The patient is a very pleasant but unfortunate 57 year old gentleman who was found on workup of extremely elevated PSA to have metastatic prostate cancer.  He also had acute renal failure from bilateral malignant obstruction.  He is on a  regimen of androgen deprivation now, which he is having a very favorable biochemical response.  His moderate hydronephrosis has been managed with ureteral stenting.  He is due for stent exchange.  He presents for this today.  Informed consent was  obtained and placed in medical record.  DESCRIPTION OF PROCEDURE:  The patient was identified.  The procedure being bilateral ureteral stent exchange was confirmed.  Procedure timeout was performed.  Intravenous antibiotics administered.  General anesthesia induced.  The patient was placed  into a low lithotomy position.  A sterile field was created by prepping and draping the patient's penis, perineum and proximal thighs using iodine.  Cystourethroscopy was performed using a 22-French rigid cystoscope with offset lens.  Inspection of  anterior and posterior urethra were unremarkable.  Inspection of bladder revealed distal end of bilateral  ureteral stents in situ.  There was minimal encrustation.  There was significant improvement of nodularity and hypervascular tissue consistent with  metastatic prostate cancer.  The distal end of the right stent was grasped, brought to the level of the urethral meatus.  Through this, a 0.038 ZIPwire was advanced to the level of the upper pole.  The stent was exchanged for an open-ended catheter and  right retrograde pyelogram was obtained.  Right retrograde pyelogram demonstrated a single right ureter single system right kidney.  There is significant improvement, but persistence of right hydroureteronephrosis now mild.  The sensor wire was once again advanced and a new 7 x 26 Contour  Polaris-type stent was placed using cystoscopic and fluoroscopic guidance.  Good proximal and distal plane were noted.  Similarly, the distal end of the left stent was grasped, brought to the level of the urethral meatus.  A 0.038 ZIPwire was advanced to  lower pole and the stent was exchanged for an open-ended catheter and left retrograde pyelogram was obtained.  Left retrograde pyelogram demonstrated a single left ureter single system left kidney.  There was improved but persistent left hydroureteronephrosis now moderate on the left, previously severe.  A new 7 x 26 Contour-type stent was placed over the sensor  working wire using cystoscopic and fluoroscopic guidance.  Good proximal and distal points were noted.  Bladder was empty per cystoscope.  Procedure terminated.  The patient tolerated the procedure well.  No immediate complications.  The patient was  taken to postanesthesia care in stable condition.  TN/NUANCE  D:04/20/2018 T:04/20/2018 JOB:[id number]

## 2018-04-23 ENCOUNTER — Encounter (HOSPITAL_BASED_OUTPATIENT_CLINIC_OR_DEPARTMENT_OTHER): Payer: Self-pay | Admitting: Urology

## 2018-05-03 ENCOUNTER — Ambulatory Visit: Payer: 59 | Admitting: Family Medicine

## 2018-05-07 ENCOUNTER — Encounter (HOSPITAL_COMMUNITY): Payer: Self-pay

## 2018-05-07 ENCOUNTER — Other Ambulatory Visit (HOSPITAL_COMMUNITY): Payer: Self-pay

## 2018-05-08 ENCOUNTER — Ambulatory Visit (HOSPITAL_COMMUNITY)
Admission: RE | Admit: 2018-05-08 | Discharge: 2018-05-08 | Disposition: A | Discharge status: Home / Self Care | Payer: 59 | Source: Ambulatory Visit | Attending: Nephrology | Admitting: Nephrology

## 2018-05-08 DIAGNOSIS — D631 Anemia in chronic kidney disease: Secondary | ICD-10-CM | POA: Diagnosis not present

## 2018-05-08 DIAGNOSIS — N189 Chronic kidney disease, unspecified: Secondary | ICD-10-CM | POA: Insufficient documentation

## 2018-05-08 MED ORDER — SODIUM CHLORIDE 0.9 % IV SOLN
510.0000 mg | INTRAVENOUS | Status: DC
  Administered 2018-05-08: 510 mg via INTRAVENOUS
  Filled 2018-05-08: qty 17

## 2018-05-10 DIAGNOSIS — Z23 Encounter for immunization: Secondary | ICD-10-CM | POA: Diagnosis not present

## 2018-05-10 DIAGNOSIS — N185 Chronic kidney disease, stage 5: Secondary | ICD-10-CM | POA: Diagnosis not present

## 2018-05-10 DIAGNOSIS — I12 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 stage renal disease: Secondary | ICD-10-CM | POA: Diagnosis not present

## 2018-05-10 DIAGNOSIS — D631 Anemia in chronic kidney disease: Secondary | ICD-10-CM | POA: Diagnosis not present

## 2018-05-10 DIAGNOSIS — M109 Gout, unspecified: Secondary | ICD-10-CM | POA: Diagnosis not present

## 2018-05-14 ENCOUNTER — Encounter (HOSPITAL_COMMUNITY): Payer: Self-pay

## 2018-05-14 ENCOUNTER — Ambulatory Visit (HOSPITAL_COMMUNITY)
Admission: RE | Admit: 2018-05-14 | Discharge: 2018-05-14 | Disposition: A | Discharge status: Home / Self Care | Payer: 59 | Source: Ambulatory Visit | Attending: Nephrology | Admitting: Nephrology

## 2018-05-14 VITALS — BP 133/96 | HR 68 | Temp 98.6°F | Resp 20 | Ht 73.0 in | Wt 233.0 lb

## 2018-05-14 DIAGNOSIS — N179 Acute kidney failure, unspecified: Secondary | ICD-10-CM | POA: Diagnosis not present

## 2018-05-14 LAB — POCT HEMOGLOBIN-HEMACUE: HEMOGLOBIN: 10 g/dL — AB (ref 13.0–17.0)

## 2018-05-14 MED ORDER — DARBEPOETIN ALFA 200 MCG/0.4ML IJ SOSY
200.0000 ug | PREFILLED_SYRINGE | INTRAMUSCULAR | Status: DC
  Administered 2018-05-14: 200 ug via SUBCUTANEOUS

## 2018-05-14 MED ORDER — SODIUM CHLORIDE 0.9 % IV SOLN
510.0000 mg | INTRAVENOUS | Status: AC
  Administered 2018-05-14: 510 mg via INTRAVENOUS
  Filled 2018-05-14: qty 17

## 2018-05-14 MED ORDER — DARBEPOETIN ALFA 200 MCG/0.4ML IJ SOSY
PREFILLED_SYRINGE | INTRAMUSCULAR | Status: AC
  Filled 2018-05-14: qty 0.4

## 2018-05-15 ENCOUNTER — Encounter (HOSPITAL_COMMUNITY): Payer: Self-pay

## 2018-06-06 ENCOUNTER — Ambulatory Visit: Payer: 59 | Admitting: Family Medicine

## 2018-06-11 ENCOUNTER — Ambulatory Visit (HOSPITAL_COMMUNITY)
Admission: RE | Admit: 2018-06-11 | Discharge: 2018-06-11 | Disposition: A | Discharge status: Home / Self Care | Payer: 59 | Source: Ambulatory Visit | Attending: Nephrology | Admitting: Nephrology

## 2018-06-11 VITALS — BP 140/99 | HR 71 | Temp 98.5°F | Resp 20

## 2018-06-11 DIAGNOSIS — N179 Acute kidney failure, unspecified: Secondary | ICD-10-CM

## 2018-06-11 DIAGNOSIS — N185 Chronic kidney disease, stage 5: Secondary | ICD-10-CM | POA: Diagnosis not present

## 2018-06-11 DIAGNOSIS — D631 Anemia in chronic kidney disease: Secondary | ICD-10-CM | POA: Insufficient documentation

## 2018-06-11 LAB — IRON AND TIBC
Iron: 48 ug/dL (ref 45–182)
Saturation Ratios: 20 % (ref 17.9–39.5)
TIBC: 237 ug/dL — ABNORMAL LOW (ref 250–450)
UIBC: 189 ug/dL

## 2018-06-11 LAB — POCT HEMOGLOBIN-HEMACUE: HEMOGLOBIN: 11.2 g/dL — AB (ref 13.0–17.0)

## 2018-06-11 LAB — FERRITIN: Ferritin: 978 ng/mL — ABNORMAL HIGH (ref 24–336)

## 2018-06-11 MED ORDER — DARBEPOETIN ALFA 200 MCG/0.4ML IJ SOSY
PREFILLED_SYRINGE | INTRAMUSCULAR | Status: AC
  Filled 2018-06-11: qty 0.4

## 2018-06-11 MED ORDER — DARBEPOETIN ALFA 200 MCG/0.4ML IJ SOSY
200.0000 ug | PREFILLED_SYRINGE | INTRAMUSCULAR | Status: DC
  Administered 2018-06-11: 200 ug via SUBCUTANEOUS

## 2018-06-18 DIAGNOSIS — M545 Low back pain: Secondary | ICD-10-CM | POA: Diagnosis not present

## 2018-06-18 DIAGNOSIS — M9903 Segmental and somatic dysfunction of lumbar region: Secondary | ICD-10-CM | POA: Diagnosis not present

## 2018-06-20 ENCOUNTER — Ambulatory Visit: Payer: 59 | Admitting: Family Medicine

## 2018-06-25 DIAGNOSIS — M545 Low back pain: Secondary | ICD-10-CM | POA: Diagnosis not present

## 2018-06-25 DIAGNOSIS — M9903 Segmental and somatic dysfunction of lumbar region: Secondary | ICD-10-CM | POA: Diagnosis not present

## 2018-07-05 DIAGNOSIS — C61 Malignant neoplasm of prostate: Secondary | ICD-10-CM | POA: Diagnosis not present

## 2018-07-09 ENCOUNTER — Encounter (HOSPITAL_COMMUNITY): Payer: Self-pay

## 2018-07-09 DIAGNOSIS — C61 Malignant neoplasm of prostate: Secondary | ICD-10-CM | POA: Diagnosis not present

## 2018-07-13 ENCOUNTER — Encounter (HOSPITAL_COMMUNITY): Payer: Self-pay

## 2018-07-16 ENCOUNTER — Ambulatory Visit: Payer: 59 | Admitting: Family Medicine

## 2018-07-16 ENCOUNTER — Ambulatory Visit (HOSPITAL_COMMUNITY)
Admission: RE | Admit: 2018-07-16 | Discharge: 2018-07-16 | Disposition: A | Discharge status: Home / Self Care | Payer: 59 | Source: Ambulatory Visit | Attending: Nephrology | Admitting: Nephrology

## 2018-07-16 VITALS — BP 162/109 | HR 67 | Temp 97.8°F | Resp 20

## 2018-07-16 DIAGNOSIS — N179 Acute kidney failure, unspecified: Secondary | ICD-10-CM | POA: Insufficient documentation

## 2018-07-16 LAB — IRON AND TIBC
Iron: 44 ug/dL — ABNORMAL LOW (ref 45–182)
Saturation Ratios: 18 % (ref 17.9–39.5)
TIBC: 251 ug/dL (ref 250–450)
UIBC: 207 ug/dL

## 2018-07-16 LAB — FERRITIN: Ferritin: 852 ng/mL — ABNORMAL HIGH (ref 24–336)

## 2018-07-16 LAB — POCT HEMOGLOBIN-HEMACUE: Hemoglobin: 10.3 g/dL — ABNORMAL LOW (ref 13.0–17.0)

## 2018-07-16 MED ORDER — DARBEPOETIN ALFA 200 MCG/0.4ML IJ SOSY
200.0000 ug | PREFILLED_SYRINGE | INTRAMUSCULAR | Status: DC
  Administered 2018-07-16: 200 ug via SUBCUTANEOUS

## 2018-07-16 MED ORDER — DARBEPOETIN ALFA 200 MCG/0.4ML IJ SOSY
PREFILLED_SYRINGE | INTRAMUSCULAR | Status: AC
  Filled 2018-07-16: qty 0.4

## 2018-07-26 DIAGNOSIS — I12 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 stage renal disease: Secondary | ICD-10-CM | POA: Diagnosis not present

## 2018-07-26 DIAGNOSIS — M109 Gout, unspecified: Secondary | ICD-10-CM | POA: Diagnosis not present

## 2018-07-26 DIAGNOSIS — D631 Anemia in chronic kidney disease: Secondary | ICD-10-CM | POA: Diagnosis not present

## 2018-07-26 DIAGNOSIS — N185 Chronic kidney disease, stage 5: Secondary | ICD-10-CM | POA: Diagnosis not present

## 2018-07-27 ENCOUNTER — Other Ambulatory Visit: Payer: Self-pay | Admitting: Nephrology

## 2018-07-27 ENCOUNTER — Ambulatory Visit: Payer: 59 | Admitting: Family Medicine

## 2018-07-27 DIAGNOSIS — N1330 Unspecified hydronephrosis: Secondary | ICD-10-CM

## 2018-07-27 DIAGNOSIS — N185 Chronic kidney disease, stage 5: Secondary | ICD-10-CM

## 2018-07-30 ENCOUNTER — Ambulatory Visit
Admission: RE | Admit: 2018-07-30 | Discharge: 2018-07-30 | Disposition: A | Discharge status: Home / Self Care | Payer: 59 | Source: Ambulatory Visit | Attending: Nephrology | Admitting: Nephrology

## 2018-07-30 DIAGNOSIS — N1330 Unspecified hydronephrosis: Secondary | ICD-10-CM | POA: Diagnosis not present

## 2018-07-30 DIAGNOSIS — N185 Chronic kidney disease, stage 5: Secondary | ICD-10-CM

## 2018-08-03 DIAGNOSIS — N130 Hydronephrosis with ureteropelvic junction obstruction: Secondary | ICD-10-CM | POA: Diagnosis not present

## 2018-08-03 DIAGNOSIS — R8271 Bacteriuria: Secondary | ICD-10-CM | POA: Diagnosis not present

## 2018-08-06 ENCOUNTER — Other Ambulatory Visit: Payer: Self-pay | Admitting: Urology

## 2018-08-07 ENCOUNTER — Other Ambulatory Visit: Payer: Self-pay

## 2018-08-07 ENCOUNTER — Encounter (HOSPITAL_BASED_OUTPATIENT_CLINIC_OR_DEPARTMENT_OTHER): Payer: Self-pay

## 2018-08-07 NOTE — Progress Notes (Signed)
Spoke with:  Chrissie Noa NPO:  After Midnight, no gum, candy, or mints  Arrival time:  0530AM Labs: Istat 4 (EKG in epic and chart) AM medications: Amlodipine, Allopurinol, Pantoprazole Pre op orders: Yes Ride home:  Pamala Hurry (wife) [phone]

## 2018-08-09 MED ORDER — LACTATED RINGERS IV SOLN
INTRAVENOUS | Status: DC
  Filled 2018-08-09: qty 1000

## 2018-08-09 NOTE — Anesthesia Preprocedure Evaluation (Addendum)
Anesthesia Evaluation  Patient identified by MRN, date of birth, ID band Patient awake    Reviewed: Allergy & Precautions, NPO status , Patient's Chart, lab work & pertinent test results  Airway Mallampati: II  TM Distance: >3 FB Neck ROM: Full    Dental no notable dental hx. (+) Teeth Intact, Dental Advisory Given,    Pulmonary Current Smoker,    Pulmonary exam normal breath sounds clear to auscultation       Cardiovascular Exercise Tolerance: Good hypertension, Pt. on medications negative cardio ROS Normal cardiovascular exam Rhythm:Regular Rate:Normal  12/07/2017 EKG SR w LVH R *%   Neuro/Psych negative neurological ROS  negative psych ROS   GI/Hepatic GERD  Medicated,  Endo/Other    Renal/GU Renal Insufficiency   Hx of Metastatic Prostate CA    Musculoskeletal   Abdominal (+) + obese,   Peds  Hematology  (+) anemia , Hgb 10.3   Anesthesia Other Findings   Reproductive/Obstetrics                            Anesthesia Physical Anesthesia Plan  ASA: III  Anesthesia Plan: General   Post-op Pain Management:    Induction: Intravenous  PONV Risk Score and Plan: 1 and Treatment may vary due to age or medical condition, Ondansetron and Dexamethasone  Airway Management Planned: LMA  Additional Equipment:   Intra-op Plan:   Post-operative Plan:   Informed Consent: I have reviewed the patients History and Physical, chart, labs and discussed the procedure including the risks, benefits and alternatives for the proposed anesthesia with the patient or authorized representative who has indicated his/her understanding and acceptance.     Dental advisory given  Plan Discussed with:   Anesthesia Plan Comments: (Cysto w bilateral Stent replacement)       Anesthesia Quick Evaluation

## 2018-08-10 ENCOUNTER — Encounter (HOSPITAL_BASED_OUTPATIENT_CLINIC_OR_DEPARTMENT_OTHER): Payer: Self-pay | Admitting: *Deleted

## 2018-08-10 ENCOUNTER — Ambulatory Visit: Payer: 59 | Admitting: Family Medicine

## 2018-08-10 ENCOUNTER — Ambulatory Visit (HOSPITAL_BASED_OUTPATIENT_CLINIC_OR_DEPARTMENT_OTHER): Payer: 59 | Admitting: Anesthesiology

## 2018-08-10 ENCOUNTER — Ambulatory Visit (HOSPITAL_BASED_OUTPATIENT_CLINIC_OR_DEPARTMENT_OTHER)
Admission: RE | Admit: 2018-08-10 | Discharge: 2018-08-10 | Disposition: A | Discharge status: Home / Self Care | Payer: 59 | Attending: Urology | Admitting: Urology

## 2018-08-10 ENCOUNTER — Encounter (HOSPITAL_BASED_OUTPATIENT_CLINIC_OR_DEPARTMENT_OTHER)
Admission: RE | Disposition: A | Discharge status: Home / Self Care | Payer: Self-pay | Source: Home / Self Care | Attending: Urology

## 2018-08-10 DIAGNOSIS — K219 Gastro-esophageal reflux disease without esophagitis: Secondary | ICD-10-CM | POA: Diagnosis not present

## 2018-08-10 DIAGNOSIS — R7303 Prediabetes: Secondary | ICD-10-CM | POA: Diagnosis not present

## 2018-08-10 DIAGNOSIS — I10 Essential (primary) hypertension: Secondary | ICD-10-CM | POA: Diagnosis not present

## 2018-08-10 DIAGNOSIS — N131 Hydronephrosis with ureteral stricture, not elsewhere classified: Secondary | ICD-10-CM | POA: Insufficient documentation

## 2018-08-10 DIAGNOSIS — C61 Malignant neoplasm of prostate: Secondary | ICD-10-CM | POA: Diagnosis not present

## 2018-08-10 DIAGNOSIS — N184 Chronic kidney disease, stage 4 (severe): Secondary | ICD-10-CM | POA: Insufficient documentation

## 2018-08-10 DIAGNOSIS — Z466 Encounter for fitting and adjustment of urinary device: Secondary | ICD-10-CM | POA: Diagnosis not present

## 2018-08-10 DIAGNOSIS — N135 Crossing vessel and stricture of ureter without hydronephrosis: Secondary | ICD-10-CM | POA: Diagnosis not present

## 2018-08-10 DIAGNOSIS — I129 Hypertensive chronic kidney disease with stage 1 through stage 4 chronic kidney disease, or unspecified chronic kidney disease: Secondary | ICD-10-CM | POA: Insufficient documentation

## 2018-08-10 DIAGNOSIS — C7919 Secondary malignant neoplasm of other urinary organs: Secondary | ICD-10-CM | POA: Diagnosis not present

## 2018-08-10 DIAGNOSIS — F17290 Nicotine dependence, other tobacco product, uncomplicated: Secondary | ICD-10-CM | POA: Insufficient documentation

## 2018-08-10 DIAGNOSIS — E669 Obesity, unspecified: Secondary | ICD-10-CM | POA: Insufficient documentation

## 2018-08-10 DIAGNOSIS — F17210 Nicotine dependence, cigarettes, uncomplicated: Secondary | ICD-10-CM | POA: Diagnosis not present

## 2018-08-10 DIAGNOSIS — Z79899 Other long term (current) drug therapy: Secondary | ICD-10-CM | POA: Insufficient documentation

## 2018-08-10 DIAGNOSIS — Z6830 Body mass index (BMI) 30.0-30.9, adult: Secondary | ICD-10-CM | POA: Diagnosis not present

## 2018-08-10 HISTORY — DX: Gastro-esophageal reflux disease without esophagitis: K21.9

## 2018-08-10 HISTORY — DX: Presence of spectacles and contact lenses: Z97.3

## 2018-08-10 HISTORY — DX: Malignant neoplasm of prostate: C61

## 2018-08-10 HISTORY — DX: Unspecified fracture of shaft of unspecified ulna, initial encounter for closed fracture: S52.209A

## 2018-08-10 HISTORY — DX: Chronic kidney disease, stage 4 (severe): N18.4

## 2018-08-10 HISTORY — PX: CYSTOSCOPY W/ URETERAL STENT PLACEMENT: SHX1429

## 2018-08-10 HISTORY — DX: Unspecified hydronephrosis: N13.30

## 2018-08-10 LAB — POCT I-STAT 4, (NA,K, GLUC, HGB,HCT)
Glucose, Bld: 102 mg/dL — ABNORMAL HIGH (ref 70–99)
HCT: 34 % — ABNORMAL LOW (ref 39.0–52.0)
Hemoglobin: 11.6 g/dL — ABNORMAL LOW (ref 13.0–17.0)
Potassium: 4.4 mmol/L (ref 3.5–5.1)
Sodium: 139 mmol/L (ref 135–145)

## 2018-08-10 SURGERY — CYSTOSCOPY, FLEXIBLE, WITH STENT REPLACEMENT
Anesthesia: General | Site: Renal | Laterality: Bilateral

## 2018-08-10 MED ORDER — SODIUM CHLORIDE 0.9 % IV SOLN
INTRAVENOUS | Status: AC
  Filled 2018-08-10: qty 100

## 2018-08-10 MED ORDER — HYDROMORPHONE HCL 1 MG/ML IJ SOLN
0.2500 mg | INTRAMUSCULAR | Status: DC | PRN
  Filled 2018-08-10: qty 0.5

## 2018-08-10 MED ORDER — GABAPENTIN 300 MG PO CAPS
300.0000 mg | ORAL_CAPSULE | Freq: Once | ORAL | Status: AC
  Administered 2018-08-10: 300 mg via ORAL
  Filled 2018-08-10: qty 1

## 2018-08-10 MED ORDER — GABAPENTIN 300 MG PO CAPS
ORAL_CAPSULE | ORAL | Status: AC
  Filled 2018-08-10: qty 1

## 2018-08-10 MED ORDER — DEXAMETHASONE SODIUM PHOSPHATE 10 MG/ML IJ SOLN
INTRAMUSCULAR | Status: AC
  Filled 2018-08-10: qty 1

## 2018-08-10 MED ORDER — ACETAMINOPHEN 500 MG PO TABS
ORAL_TABLET | ORAL | Status: AC
  Filled 2018-08-10: qty 2

## 2018-08-10 MED ORDER — MEPERIDINE HCL 25 MG/ML IJ SOLN
6.2500 mg | INTRAMUSCULAR | Status: DC | PRN
  Filled 2018-08-10: qty 1

## 2018-08-10 MED ORDER — ACETAMINOPHEN 500 MG PO TABS
1000.0000 mg | ORAL_TABLET | Freq: Once | ORAL | Status: AC
  Administered 2018-08-10: 1000 mg via ORAL
  Filled 2018-08-10: qty 2

## 2018-08-10 MED ORDER — ONDANSETRON HCL 4 MG/2ML IJ SOLN
INTRAMUSCULAR | Status: DC | PRN
  Administered 2018-08-10: 4 mg via INTRAVENOUS

## 2018-08-10 MED ORDER — DEXAMETHASONE SODIUM PHOSPHATE 4 MG/ML IJ SOLN
INTRAMUSCULAR | Status: DC | PRN
  Administered 2018-08-10: 10 mg via INTRAVENOUS

## 2018-08-10 MED ORDER — TRAMADOL HCL 50 MG PO TABS: 50.0000 mg | ORAL_TABLET | Freq: Four times a day (QID) | ORAL | 0 refills | Status: DC | PRN

## 2018-08-10 MED ORDER — FENTANYL CITRATE (PF) 100 MCG/2ML IJ SOLN
INTRAMUSCULAR | Status: AC
  Filled 2018-08-10: qty 2

## 2018-08-10 MED ORDER — FENTANYL CITRATE (PF) 100 MCG/2ML IJ SOLN
INTRAMUSCULAR | Status: DC | PRN
  Administered 2018-08-10: 50 ug via INTRAVENOUS

## 2018-08-10 MED ORDER — ONDANSETRON HCL 4 MG/2ML IJ SOLN
INTRAMUSCULAR | Status: AC
  Filled 2018-08-10: qty 2

## 2018-08-10 MED ORDER — IOHEXOL 300 MG/ML  SOLN
INTRAMUSCULAR | Status: DC | PRN
  Administered 2018-08-10: 20 mL

## 2018-08-10 MED ORDER — LIDOCAINE 2% (20 MG/ML) 5 ML SYRINGE
INTRAMUSCULAR | Status: AC
  Filled 2018-08-10: qty 5

## 2018-08-10 MED ORDER — PROPOFOL 10 MG/ML IV BOLUS
INTRAVENOUS | Status: AC
  Filled 2018-08-10: qty 40

## 2018-08-10 MED ORDER — PROPOFOL 10 MG/ML IV BOLUS
INTRAVENOUS | Status: DC | PRN
  Administered 2018-08-10: 150 mg via INTRAVENOUS

## 2018-08-10 MED ORDER — HYDROCODONE-ACETAMINOPHEN 7.5-325 MG PO TABS
1.0000 | ORAL_TABLET | Freq: Once | ORAL | Status: DC | PRN
  Filled 2018-08-10: qty 1

## 2018-08-10 MED ORDER — KETOROLAC TROMETHAMINE 30 MG/ML IJ SOLN
INTRAMUSCULAR | Status: AC
  Filled 2018-08-10: qty 1

## 2018-08-10 MED ORDER — SODIUM CHLORIDE 0.9 % IV SOLN
2.0000 g | INTRAVENOUS | Status: AC
  Administered 2018-08-10: 2 g via INTRAVENOUS
  Filled 2018-08-10: qty 20

## 2018-08-10 MED ORDER — LIDOCAINE 2% (20 MG/ML) 5 ML SYRINGE
INTRAMUSCULAR | Status: DC | PRN
  Administered 2018-08-10: 100 mg via INTRAVENOUS

## 2018-08-10 MED ORDER — ARTIFICIAL TEARS OPHTHALMIC OINT
TOPICAL_OINTMENT | OPHTHALMIC | Status: AC
  Filled 2018-08-10: qty 3.5

## 2018-08-10 MED ORDER — MIDAZOLAM HCL 5 MG/5ML IJ SOLN
INTRAMUSCULAR | Status: DC | PRN
  Administered 2018-08-10: 2 mg via INTRAVENOUS

## 2018-08-10 MED ORDER — ACETAMINOPHEN 10 MG/ML IV SOLN
1000.0000 mg | Freq: Once | INTRAVENOUS | Status: DC | PRN
  Filled 2018-08-10: qty 100

## 2018-08-10 MED ORDER — MIDAZOLAM HCL 2 MG/2ML IJ SOLN
INTRAMUSCULAR | Status: AC
  Filled 2018-08-10: qty 2

## 2018-08-10 MED ORDER — PROMETHAZINE HCL 25 MG/ML IJ SOLN
6.2500 mg | INTRAMUSCULAR | Status: DC | PRN
  Filled 2018-08-10: qty 1

## 2018-08-10 MED ORDER — PROPOFOL 10 MG/ML IV BOLUS
INTRAVENOUS | Status: AC
  Filled 2018-08-10: qty 20

## 2018-08-10 MED ORDER — SODIUM CHLORIDE 0.9 % IV SOLN
INTRAVENOUS | Status: DC
  Administered 2018-08-10: 06:00:00 via INTRAVENOUS
  Filled 2018-08-10: qty 1000

## 2018-08-10 MED ORDER — CEFTRIAXONE SODIUM 1 G IJ SOLR
INTRAMUSCULAR | Status: AC
  Filled 2018-08-10: qty 10

## 2018-08-10 MED ORDER — STERILE WATER FOR IRRIGATION IR SOLN
Status: DC | PRN
  Administered 2018-08-10: 3000 mL

## 2018-08-10 SURGICAL SUPPLY — 27 items
BAG DRAIN URO-CYSTO SKYTR STRL (DRAIN) ×3 IMPLANT
BASKET LASER NITINOL 1.9FR (BASKET) IMPLANT
CATH INTERMIT  6FR 70CM (CATHETERS) IMPLANT
CLOTH BEACON ORANGE TIMEOUT ST (SAFETY) ×3 IMPLANT
FIBER LASER FLEXIVA 365 (UROLOGICAL SUPPLIES) IMPLANT
FIBER LASER TRAC TIP (UROLOGICAL SUPPLIES) IMPLANT
GLOVE BIO SURGEON STRL SZ7.5 (GLOVE) ×3 IMPLANT
GLOVE BIOGEL PI IND STRL 8.5 (GLOVE) IMPLANT
GLOVE BIOGEL PI INDICATOR 8.5 (GLOVE) ×4
GLOVE INDICATOR 8.5 STRL (GLOVE) ×2 IMPLANT
GOWN STRL REUS W/TWL LRG LVL3 (GOWN DISPOSABLE) ×5 IMPLANT
GUIDEWIRE ANG ZIPWIRE 038X150 (WIRE) ×3 IMPLANT
GUIDEWIRE STR DUAL SENSOR (WIRE) ×3 IMPLANT
INFUSOR MANOMETER BAG 3000ML (MISCELLANEOUS) ×1 IMPLANT
IV NS 1000ML (IV SOLUTION)
IV NS 1000ML BAXH (IV SOLUTION) ×1 IMPLANT
IV NS IRRIG 3000ML ARTHROMATIC (IV SOLUTION) ×3 IMPLANT
KIT TURNOVER CYSTO (KITS) ×3 IMPLANT
MANIFOLD NEPTUNE II (INSTRUMENTS) ×3 IMPLANT
NS IRRIG 500ML POUR BTL (IV SOLUTION) ×2 IMPLANT
PACK CYSTO (CUSTOM PROCEDURE TRAY) ×3 IMPLANT
STENT CONTOUR 7FRX26 (STENTS) ×4 IMPLANT
SYR 10ML LL (SYRINGE) ×3 IMPLANT
TUBE CONNECTING 12'X1/4 (SUCTIONS)
TUBE CONNECTING 12X1/4 (SUCTIONS) IMPLANT
TUBE FEEDING 8FR 16IN STR KANG (MISCELLANEOUS) IMPLANT
TUBING UROLOGY SET (TUBING) IMPLANT

## 2018-08-10 NOTE — H&P (Signed)
Devon Cook is an 58 y.o. male.    Chief Complaint: Pre-OP BILATERAL Ureteral Stent Exchange  HPI:   1 - Malignant Ureteral Obstruction - Cr 17 by PCP labs on eval malaize 11/2017. Non-con CT with hydro to level of thickened bladder. Bilateral neph tubes placed 11/2017 and converted to bilateral 7x26 stents 12/2017 in OR. Now established with [place] kidney with GFR 15 range, requires aranesp.   Recent Course: 04/2018 - 7x26 exchange 08/2018 - Cr 6.   2 - Metastatic Prostate Cancer - PSA 787 11/2017 with CT with pelvic lymphadenopathy (non-bulky) and small L3 vertebral met c/w metastatic prostate cancer. OR BX 12/2017 confirms Gleason 9 and 10 disease.   Recent Course:  01/2018 Lupron '45mg'$ ; 03/2018 - PSA 0.26 / T 15  07/2018 Lupron 45 PSA/T   3- Chronic Renal Insufficiency - Cr 4's after bilateral renal decompression c/w permanent renal damage. Now established with Dr. Lorrene Reid at [facility]   PMH sig for pre-DM, obesity. NO isschemic CV disease / blood thinners.   Today "Devon Cook" is seen to proceed with bilateral stent exchange for malignant hydro. No interval fevers, most recent UCX non-clonal.     Past Medical History:  Diagnosis Date  . Acute renal failure ([place]) 11/14/2017   Hospitalized on May, 14 2019   . Allergy   . Anemia    Hospitalized on Nov 14, 2017, history of iron infusions, procrit injections  . Chronic kidney disease, stage 4 (severe) ([place])   . ED (erectile dysfunction)   . GERD (gastroesophageal reflux disease)   . Herpes genitalis in men   . Hydronephrosis 2019   Bilateral  . Hypertension 10/2004   off all meds since Nov 14, 2017  . Impaired fasting glucose 10/2004  . Obesity   . Prostate cancer ([place])   . Ulna fracture 2015   Right  . Wears glasses     Past Surgical History:  Procedure Laterality Date  . CYSTOSCOPY W/ URETERAL STENT PLACEMENT Bilateral 12/08/2017   Procedure: CYSTOSCOPY WITH RETROGRADE PYELOGRAM/URETERAL STENT PLACEMENT;  Surgeon:  Alexis Frock, MD;  Location: [facility];  Service: Urology;  Laterality: Bilateral;  . CYSTOSCOPY W/ URETERAL STENT PLACEMENT Bilateral 04/20/2018   Procedure: CYSTOSCOPY WITH STENT REPLACEMENT;  Surgeon: Alexis Frock, MD;  Location: [facility];  Service: Urology;  Laterality: Bilateral;  . IR NEPHROSTOMY PLACEMENT LEFT  11/15/2017  . IR NEPHROSTOMY PLACEMENT RIGHT  11/15/2017  . PROSTATE BIOPSY N/A 12/08/2017   Procedure: BIOPSY TRANSRECTAL ULTRASONIC PROSTATE (TUBP), PERINEAL BIOPSY;  Surgeon: Alexis Frock, MD;  Location: [facility];  Service: Urology;  Laterality: N/A;  . TRANSURETHRAL RESECTION OF BLADDER TUMOR N/A 12/08/2017   Procedure: TRANSURETHRAL RESECTION OF BLADDER TUMOR (TURBT);  Surgeon: Alexis Frock, MD;  Location: [facility];  Service: Urology;  Laterality: N/A;  . TRICEPS TENDON REPAIR Bilateral 08/2005    History reviewed. No pertinent family history. Social History:  reports that he has been smoking cigarettes and pipe. He has a 3.00 pack-year smoking history. He has never used smokeless tobacco. He reports current alcohol use. He reports that he does not use drugs.  Allergies: No Known Allergies  Medications Prior to Admission  Medication Sig Dispense Refill  . amLODipine (NORVASC) 10 MG tablet Take 10 mg by mouth every morning.    Marland Kitchen epoetin alfa (EPOGEN,PROCRIT) 2000 UNIT/ML injection 2,000 Units. Every 4 weeks    . acetaminophen (TYLENOL) 325 MG tablet Take 2 tablets (650 mg total) by mouth every 6 (six) hours  as needed for mild pain (or Fever >/= 101).    Marland Kitchen allopurinol (ZYLOPRIM) 100 MG tablet TAKE 1 TABLET BY MOUTH ONCE DAILY (Patient taking differently: 2 (two) times daily. ) 30 tablet 0  . Multiple Vitamins-Minerals (MULTIVITAMIN WITH MINERALS) tablet Take 1 tablet by mouth daily.      . pantoprazole (PROTONIX) 40 MG tablet TAKE 1 TABLET BY MOUTH ONCE DAILY (Patient taking differently: every morning. ) 30 tablet 0  . sodium bicarbonate 650 MG tablet Take 2  tablets (1,300 mg total) by mouth 2 (two) times daily. (Patient taking differently: Take 1,300 mg by mouth 3 (three) times daily. ) 90 tablet 1  . traMADol (ULTRAM) 50 MG tablet Take 1-2 tablets (50-100 mg total) by mouth every 6 (six) hours as needed for moderate pain or severe pain. Post-operatively (Patient not taking: Reported on 08/07/2018) 15 tablet 0    No results found for this or any previous visit (from the past 48 hour(s)). No results found.  Review of Systems  Constitutional: Positive for malaise/fatigue. Negative for chills and fever.  HENT: Negative.   Eyes: Negative.   Respiratory: Negative.   Cardiovascular: Negative.   Gastrointestinal: Negative.   Genitourinary: Negative.   Musculoskeletal: Negative.   Skin: Negative.   Neurological: Negative.   Endo/Heme/Allergies: Negative.   Psychiatric/Behavioral: Negative.     Height '6\' 2"'$  (1.88 m), weight 106.6 kg. Physical Exam  Constitutional: He appears well-developed.  HENT:  Head: Normocephalic.  Eyes: Pupils are equal, round, and reactive to light.  Neck: Normal range of motion.  Cardiovascular: Normal rate.  Respiratory: Effort normal.  GI: Soft.  Genitourinary:    Genitourinary Comments: No CVAT at present.   Musculoskeletal: Normal range of motion.  Neurological: He is alert.  Skin: Skin is warm.  Psychiatric: He has a normal mood and affect.     Assessment/Plan  Proceed as planned wit cysto, BILATERAL ureteral stent exchange for malignant hydro. Risks, benefits, alternatives, expected peri-op course discussed previousliy and reiterated today.   Alexis Frock, MD 08/10/2018, 5:33 AM

## 2018-08-10 NOTE — Anesthesia Postprocedure Evaluation (Signed)
Anesthesia Post Note  Patient: Devon Cook  Procedure(s) Performed: CYSTOSCOPY WITH STENT REPLACEMENT, BILATERAL RETROGRADES (Bilateral Renal)     Patient location during evaluation: PACU Anesthesia Type: General Level of consciousness: awake and alert Pain management: pain level controlled Vital Signs Assessment: post-procedure vital signs reviewed and stable Respiratory status: spontaneous breathing, nonlabored ventilation, respiratory function stable and patient connected to nasal cannula oxygen Cardiovascular status: blood pressure returned to baseline and stable Postop Assessment: no apparent nausea or vomiting Anesthetic complications: no    Last Vitals:  Vitals:   08/10/18 0830 08/10/18 0911  BP: (!) 143/89 (!) 156/96  Pulse: 66 62  Resp: 14 18  Temp:  36.7 C  SpO2: 100% 100%    Last Pain:  Vitals:   08/10/18 0830  TempSrc:   PainSc: 0-No pain                 Barnet Glasgow

## 2018-08-10 NOTE — Anesthesia Procedure Notes (Signed)
Procedure Name: LMA Insertion Date/Time: 08/10/2018 7:34 AM Performed by: Barnet Glasgow, MD Pre-anesthesia Checklist: Patient identified, Emergency Drugs available, Suction available and Patient being monitored Patient Re-evaluated:Patient Re-evaluated prior to induction Oxygen Delivery Method: Circle system utilized Preoxygenation: Pre-oxygenation with 100% oxygen Induction Type: IV induction Ventilation: Mask ventilation without difficulty LMA: LMA inserted LMA Size: 5.0 Number of attempts: 1 Airway Equipment and Method: Bite block Placement Confirmation: positive ETCO2 Tube secured with: Tape Dental Injury: Teeth and Oropharynx as per pre-operative assessment

## 2018-08-10 NOTE — Transfer of Care (Signed)
  Last Vitals:  Vitals Value Taken Time  BP 102/64 08/10/2018  8:01 AM  Temp    Pulse 67 08/10/2018  8:04 AM  Resp 17 08/10/2018  8:04 AM  SpO2 100 % 08/10/2018  8:04 AM  Vitals shown include unvalidated device data.  Last Pain:  Vitals:   08/10/18 0536  TempSrc: Oral         Immediate Anesthesia Transfer of Care Note  Patient: Devon Cook  Procedure(s) Performed: Procedure(s) (LRB): CYSTOSCOPY WITH STENT REPLACEMENT, BILATERAL RETROGRADES (Bilateral)  Patient Location: PACU  Anesthesia Type: General  Level of Consciousness: awake, alert  and oriented  Airway & Oxygen Therapy: Patient Spontanous Breathing and Patient connected to nasal cannula oxygen  Post-op Assessment: Report given to PACU RN and Post -op Vital signs reviewed and stable  Post vital signs: Reviewed and stable  Complications: No apparent anesthesia complications

## 2018-08-10 NOTE — Brief Op Note (Signed)
08/10/2018  7:52 AM  PATIENT:  [name]  58 y.o. male  PRE-OPERATIVE DIAGNOSIS:  BILATERA URETERAL OBSTRUCTION  POST-OPERATIVE DIAGNOSIS:  BILATERA URETERAL OBSTRUCTION  PROCEDURE:  Procedure(s) with comments: CYSTOSCOPY WITH STENT REPLACEMENT, BILATERAL RETROGRADES (Bilateral) - 45 MINS  SURGEON:  Surgeon(s) and Role:    * Alexis Frock, MD - Primary  PHYSICIAN ASSISTANT:   ASSISTANTS: none   ANESTHESIA:   general  EBL:  minimal   BLOOD ADMINISTERED:none  DRAINS: none   LOCAL MEDICATIONS USED:  NONE  SPECIMEN:  Source of Specimen:  bilateral stents  DISPOSITION OF SPECIMEN:  discard  COUNTS:  YES  TOURNIQUET:  * No tourniquets in log *  DICTATION: .Other Dictation: Dictation Number [id number]  PLAN OF CARE: Discharge to home after PACU  PATIENT DISPOSITION:  PACU - hemodynamically stable.   Delay start of Pharmacological VTE agent (>24hrs) due to surgical blood loss or risk of bleeding: yes

## 2018-08-10 NOTE — Discharge Instructions (Signed)
Post Anesthesia Home Care Instructions  Activity: Get plenty of rest for the remainder of the day. A responsible individual must stay with you for 24 hours following the procedure.  For the next 24 hours, DO NOT: -Drive a car -Operate machinery -Drink alcoholic beverages -Take any medication unless instructed by your physician -Make any legal decisions or sign important papers.  Meals: Start with liquid foods such as gelatin or soup. Progress to regular foods as tolerated. Avoid greasy, spicy, heavy foods. If nausea and/or vomiting occur, drink only clear liquids until the nausea and/or vomiting subsides. Call your physician if vomiting continues.  Special Instructions/Symptoms: Your throat may feel dry or sore from the anesthesia or the breathing tube placed in your throat during surgery. If this causes discomfort, gargle with warm salt water. The discomfort should disappear within 24 hours.      [facility] [phone] Post Ureteroscopy With or Without Stent Instructions  Definitions:  Ureter: The duct that transports urine from the kidney to the bladder. Stent:   A plastic hollow tube that is placed into the ureter, from the kidney to the bladder to prevent the ureter from swelling shut.  GENERAL INSTRUCTIONS:  Despite the fact that no skin incisions were used, the area around the ureter and bladder is raw and irritated. The stent is a foreign body which will further irritate the bladder wall. This irritation is manifested by increased frequency of urination, both day and night, and by an increase in the urge to urinate. In some, the urge to urinate is present almost always. Sometimes the urge is strong enough that you may not be able to stop yourself from urinating. The only real cure is to remove the stent and then give time for the bladder wall to heal which can't be done until the danger of the ureter swelling shut has passed, which varies.  You may see  some blood in your urine while the stent is in place and a few days afterwards. Do not be alarmed, even if the urine was clear for a while. Get off your feet and drink lots of fluids until clearing occurs. If you start to pass clots or don't improve, call us.  DIET: You may return to your normal diet immediately. Because of the raw surface of your bladder, alcohol, spicy foods, acid type foods and drinks with caffeine may cause irritation or frequency and should be used in moderation. To keep your urine flowing freely and to avoid constipation, drink plenty of fluids during the day ( 8-10 glasses ). Tip: Avoid cranberry juice because it is very acidic.  ACTIVITY: Your physical activity doesn't need to be restricted. However, if you are very active, you may see some blood in your urine. We suggest that you reduce your activity under these circumstances until the bleeding has stopped.  BOWELS: It is important to keep your bowels regular during the postoperative period. Straining with bowel movements can cause bleeding. A bowel movement every other day is reasonable. Use a mild laxative if needed, such as Milk of Magnesia 2-3 tablespoons, or 2 Dulcolax tablets. Call if you continue to have problems. If you have been taking narcotics for pain, before, during or after your surgery, you may be constipated. Take a laxative if necessary.   MEDICATION: You should resume your pre-surgery medications unless told not to. In addition you will often be given an antibiotic to prevent infection. These should be taken as prescribed until the bottles are finished unless   you are having an unusual reaction to one of the drugs.  PROBLEMS YOU SHOULD REPORT TO US: Fevers over 100.5 Fahrenheit. Heavy bleeding, or clots ( See above notes about blood in urine ). Inability to urinate. Drug reactions ( hives, rash, nausea, vomiting, diarrhea ). Severe burning or pain with urination that is not improving.  FOLLOW-UP: You  will need a follow-up appointment to monitor your progress. Call for this appointment at the number listed above. Usually the first appointment will be about three to fourteen days after your surgery.       

## 2018-08-10 NOTE — Op Note (Signed)
NAME: Devon Cook, Devon Cook MEDICAL RECORD [id number] ACCOUNT [id number] DATE OF BIRTH:1960-07-05 FACILITY: [facility] LOCATION: [facility] PHYSICIAN:Keonna Raether Tresa Moore, MD  OPERATIVE REPORT  DATE OF PROCEDURE:  08/10/2018  PREOPERATIVE DIAGNOSIS:  Metastatic prostate cancer with malignant ureteral obstruction.  PROCEDURE: 1.  Cystoscopy, bilateral pyelograms, interpretation. 2.  Exchange of bilateral ureteral stents, 7 x 26 Contour, no tether.  ESTIMATED BLOOD LOSS:  Nil.  SPECIMENS:  None.  SPECIMEN:  In situ, bilateral stents for discard,  inspected and intact, minimal encrustation.  FINDINGS: 1.  Significant improvement in nodular bladder tissue consistent with advanced prostate cancer. 2.  Minimal bilateral hydronephrosis. 3.  Successful exchange of bilateral ureteral stents proximal and upper pole distal in urinary bladder.  INDICATIONS:  The patient is a very pleasant but unfortunate 58 year old gentleman with history of metastatic prostate cancer with malignant obstruction.  He has been stent dependent for some time and done quite well with this.  His PSA has had a  dramatic and favorable response to androgen deprivation.  He has now significant chronic renal insufficiency.  He was noted at a nephrology appointment to have a somewhat decrease in his GFR and some concern for worsening hydronephrosis by ultrasound.   He was referred back for management.  He was approximately 4 months post-stent exchange and a decision was made to proceed with this.  Volume status remained good in his potassium as well.  Informed consent was obtained and placed in medical record.  DESCRIPTION OF PROCEDURE:  Patient being [name] and procedure being cystoscopy, bilateral stent exchange was confirmed.  Procedure timeout was performed.  Antibiotics administered.  General anesthesia induced.  The patient was placed into a low  lithotomy position.  Sterile field was created.  Prepped and draped his penis,  perineum and proximal thighs using iodine.  Cystourethroscopy was performed with a 22-French rigid cystoscope with offset lens.  Inspection of anterior and posterior urethra  were unremarkable.  Inspection of bladder revealed distal end of bilateral ureteral stents in situ.  The ureteral orifices were much more visibly patent now with much less nodular tissue compressing the area at the end bladder neck, again consistent with  a favorable response to androgen deprivation.  Both stents were removed, set aside for discard.  They were inspected and intact.  The left ureteral orifice was cannulated with [place] renal catheter and left retrograde pyelogram was obtained.  Left retrograde pyelogram demonstrated a single left ureter single system left kidney.  There was minimal hydronephrosis, no filling defects noted.  A 0.038 sensor wire was advanced to lower pole.  A new 7 x 26 Contour type stent was placed.  Good  proximal and distal plane were noted.  Similarly, a right pyelogram was obtained.  A right retrograde pyelogram demonstrated a single right ureter, single system right kidney, minimal hydronephrosis noted.  A ZIPwire was advanced over which a new 7 x 26 Contour Polaris-type stent was placed using cystoscopic and fluoroscopic guidance.   Good proximal and distal plane were noted.  Bladder was partially emptied per cystoscope and procedure terminated.    The patient tolerated the procedure well.  No immediate perinatal complications.  The patient was taken to postanesthesia care in stable condition.  AN/NUANCE  D:08/10/2018 T:08/10/2018 JOB:[id number]

## 2018-08-13 ENCOUNTER — Encounter (HOSPITAL_COMMUNITY): Payer: Self-pay

## 2018-08-13 ENCOUNTER — Encounter (HOSPITAL_BASED_OUTPATIENT_CLINIC_OR_DEPARTMENT_OTHER): Payer: Self-pay | Admitting: Urology

## 2018-08-20 ENCOUNTER — Encounter (HOSPITAL_COMMUNITY)
Admission: RE | Admit: 2018-08-20 | Discharge: 2018-08-20 | Disposition: A | Discharge status: Home / Self Care | Payer: 59 | Source: Ambulatory Visit | Attending: Nephrology | Admitting: Nephrology

## 2018-08-20 VITALS — BP 129/86 | HR 67 | Temp 98.6°F | Resp 20

## 2018-08-20 DIAGNOSIS — N179 Acute kidney failure, unspecified: Secondary | ICD-10-CM | POA: Diagnosis not present

## 2018-08-20 LAB — IRON AND TIBC
Iron: 64 ug/dL (ref 45–182)
Saturation Ratios: 24 % (ref 17.9–39.5)
TIBC: 272 ug/dL (ref 250–450)
UIBC: 208 ug/dL

## 2018-08-20 LAB — FERRITIN: Ferritin: 819 ng/mL — ABNORMAL HIGH (ref 24–336)

## 2018-08-20 LAB — POCT HEMOGLOBIN-HEMACUE: Hemoglobin: 9.9 g/dL — ABNORMAL LOW (ref 13.0–17.0)

## 2018-08-20 MED ORDER — DARBEPOETIN ALFA 200 MCG/0.4ML IJ SOSY
PREFILLED_SYRINGE | INTRAMUSCULAR | Status: AC
  Filled 2018-08-20: qty 0.4

## 2018-08-20 MED ORDER — DARBEPOETIN ALFA 200 MCG/0.4ML IJ SOSY
200.0000 ug | PREFILLED_SYRINGE | INTRAMUSCULAR | Status: DC
  Administered 2018-08-20: 200 ug via SUBCUTANEOUS

## 2018-08-22 DIAGNOSIS — M545 Low back pain: Secondary | ICD-10-CM | POA: Diagnosis not present

## 2018-08-22 DIAGNOSIS — N130 Hydronephrosis with ureteropelvic junction obstruction: Secondary | ICD-10-CM | POA: Diagnosis not present

## 2018-08-22 DIAGNOSIS — N390 Urinary tract infection, site not specified: Secondary | ICD-10-CM | POA: Diagnosis not present

## 2018-08-22 DIAGNOSIS — B965 Pseudomonas (aeruginosa) (mallei) (pseudomallei) as the cause of diseases classified elsewhere: Secondary | ICD-10-CM | POA: Diagnosis not present

## 2018-08-23 ENCOUNTER — Telehealth: Payer: Self-pay

## 2018-08-23 NOTE — Telephone Encounter (Signed)
Spoke to pt and was advise that it appeared that he had another blockage. He since than has had a stent replacement surgery and his creatinine number have been going back down since than. I have also requested the office note that go along with the labs we have. [place]

## 2018-08-24 ENCOUNTER — Encounter: Payer: Self-pay | Admitting: Family Medicine

## 2018-08-29 DIAGNOSIS — M62830 Muscle spasm of back: Secondary | ICD-10-CM | POA: Diagnosis not present

## 2018-08-29 DIAGNOSIS — M5416 Radiculopathy, lumbar region: Secondary | ICD-10-CM | POA: Diagnosis not present

## 2018-08-29 DIAGNOSIS — M545 Low back pain: Secondary | ICD-10-CM | POA: Diagnosis not present

## 2018-09-04 DIAGNOSIS — N130 Hydronephrosis with ureteropelvic junction obstruction: Secondary | ICD-10-CM | POA: Diagnosis not present

## 2018-09-04 DIAGNOSIS — C61 Malignant neoplasm of prostate: Secondary | ICD-10-CM | POA: Diagnosis not present

## 2018-09-04 DIAGNOSIS — N184 Chronic kidney disease, stage 4 (severe): Secondary | ICD-10-CM | POA: Diagnosis not present

## 2018-09-06 DIAGNOSIS — N185 Chronic kidney disease, stage 5: Secondary | ICD-10-CM | POA: Diagnosis not present

## 2018-09-06 DIAGNOSIS — M109 Gout, unspecified: Secondary | ICD-10-CM | POA: Diagnosis not present

## 2018-09-06 DIAGNOSIS — M545 Low back pain: Secondary | ICD-10-CM | POA: Diagnosis not present

## 2018-09-06 DIAGNOSIS — I12 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 stage renal disease: Secondary | ICD-10-CM | POA: Diagnosis not present

## 2018-09-06 DIAGNOSIS — D631 Anemia in chronic kidney disease: Secondary | ICD-10-CM | POA: Diagnosis not present

## 2018-09-15 DIAGNOSIS — M545 Low back pain: Secondary | ICD-10-CM | POA: Diagnosis not present

## 2018-09-15 DIAGNOSIS — M48061 Spinal stenosis, lumbar region without neurogenic claudication: Secondary | ICD-10-CM | POA: Diagnosis not present

## 2018-09-15 DIAGNOSIS — M5136 Other intervertebral disc degeneration, lumbar region: Secondary | ICD-10-CM | POA: Diagnosis not present

## 2018-09-17 ENCOUNTER — Ambulatory Visit (HOSPITAL_COMMUNITY)
Admission: RE | Admit: 2018-09-17 | Discharge: 2018-09-17 | Disposition: A | Discharge status: Home / Self Care | Payer: 59 | Source: Ambulatory Visit | Attending: Nephrology | Admitting: Nephrology

## 2018-09-17 ENCOUNTER — Other Ambulatory Visit: Payer: Self-pay

## 2018-09-17 VITALS — BP 137/83 | HR 70 | Temp 98.4°F | Resp 20

## 2018-09-17 DIAGNOSIS — N179 Acute kidney failure, unspecified: Secondary | ICD-10-CM | POA: Insufficient documentation

## 2018-09-17 LAB — IRON AND TIBC
Iron: 26 ug/dL — ABNORMAL LOW (ref 45–182)
Saturation Ratios: 9 % — ABNORMAL LOW (ref 17.9–39.5)
TIBC: 283 ug/dL (ref 250–450)
UIBC: 257 ug/dL

## 2018-09-17 LAB — POCT HEMOGLOBIN-HEMACUE: Hemoglobin: 8.4 g/dL — ABNORMAL LOW (ref 13.0–17.0)

## 2018-09-17 LAB — FERRITIN: Ferritin: 620 ng/mL — ABNORMAL HIGH (ref 24–336)

## 2018-09-17 MED ORDER — DARBEPOETIN ALFA 200 MCG/0.4ML IJ SOSY
PREFILLED_SYRINGE | INTRAMUSCULAR | Status: AC
  Filled 2018-09-17: qty 0.4

## 2018-09-17 MED ORDER — DARBEPOETIN ALFA 200 MCG/0.4ML IJ SOSY
200.0000 ug | PREFILLED_SYRINGE | INTRAMUSCULAR | Status: DC
  Administered 2018-09-17: 200 ug via SUBCUTANEOUS

## 2018-09-20 DIAGNOSIS — Z0279 Encounter for issue of other medical certificate: Secondary | ICD-10-CM

## 2018-09-21 ENCOUNTER — Other Ambulatory Visit (HOSPITAL_COMMUNITY): Payer: Self-pay | Admitting: *Deleted

## 2018-09-21 ENCOUNTER — Other Ambulatory Visit: Payer: Self-pay

## 2018-09-24 ENCOUNTER — Ambulatory Visit (HOSPITAL_COMMUNITY)
Admission: RE | Admit: 2018-09-24 | Discharge: 2018-09-24 | Disposition: A | Discharge status: Home / Self Care | Payer: 59 | Source: Ambulatory Visit | Attending: Nephrology | Admitting: Nephrology

## 2018-09-24 ENCOUNTER — Other Ambulatory Visit: Payer: Self-pay

## 2018-09-24 DIAGNOSIS — N189 Chronic kidney disease, unspecified: Secondary | ICD-10-CM | POA: Diagnosis present

## 2018-09-24 DIAGNOSIS — D631 Anemia in chronic kidney disease: Secondary | ICD-10-CM | POA: Diagnosis not present

## 2018-09-24 MED ORDER — SODIUM CHLORIDE 0.9 % IV SOLN
510.0000 mg | INTRAVENOUS | Status: DC
  Administered 2018-09-24: 510 mg via INTRAVENOUS
  Filled 2018-09-24: qty 510

## 2018-09-28 ENCOUNTER — Other Ambulatory Visit: Payer: Self-pay

## 2018-10-01 ENCOUNTER — Encounter (HOSPITAL_COMMUNITY)
Admission: RE | Admit: 2018-10-01 | Discharge: 2018-10-01 | Disposition: A | Discharge status: Home / Self Care | Payer: 59 | Source: Ambulatory Visit | Attending: Nephrology | Admitting: Nephrology

## 2018-10-01 ENCOUNTER — Other Ambulatory Visit: Payer: Self-pay

## 2018-10-01 DIAGNOSIS — N189 Chronic kidney disease, unspecified: Secondary | ICD-10-CM | POA: Diagnosis present

## 2018-10-01 DIAGNOSIS — D631 Anemia in chronic kidney disease: Secondary | ICD-10-CM | POA: Insufficient documentation

## 2018-10-01 MED ORDER — SODIUM CHLORIDE 0.9 % IV SOLN
510.0000 mg | INTRAVENOUS | Status: DC
  Administered 2018-10-01: 510 mg via INTRAVENOUS
  Filled 2018-10-01 (×2): qty 17

## 2018-10-12 ENCOUNTER — Other Ambulatory Visit: Payer: Self-pay

## 2018-10-15 ENCOUNTER — Encounter (HOSPITAL_COMMUNITY)
Admission: RE | Admit: 2018-10-15 | Discharge: 2018-10-15 | Disposition: A | Discharge status: Home / Self Care | Payer: 59 | Source: Ambulatory Visit | Attending: Nephrology | Admitting: Nephrology

## 2018-10-15 ENCOUNTER — Other Ambulatory Visit: Payer: Self-pay

## 2018-10-15 VITALS — BP 138/89 | HR 74 | Temp 97.2°F | Resp 18

## 2018-10-15 DIAGNOSIS — N179 Acute kidney failure, unspecified: Secondary | ICD-10-CM | POA: Diagnosis not present

## 2018-10-15 LAB — IRON AND TIBC
Iron: 106 ug/dL (ref 45–182)
Saturation Ratios: 34 % (ref 17.9–39.5)
TIBC: 309 ug/dL (ref 250–450)
UIBC: 203 ug/dL

## 2018-10-15 LAB — FERRITIN: Ferritin: 919 ng/mL — ABNORMAL HIGH (ref 24–336)

## 2018-10-15 LAB — POCT HEMOGLOBIN-HEMACUE: Hemoglobin: 9.4 g/dL — ABNORMAL LOW (ref 13.0–17.0)

## 2018-10-15 MED ORDER — DARBEPOETIN ALFA 200 MCG/0.4ML IJ SOSY
PREFILLED_SYRINGE | INTRAMUSCULAR | Status: AC
  Filled 2018-10-15: qty 0.4

## 2018-10-15 MED ORDER — DARBEPOETIN ALFA 200 MCG/0.4ML IJ SOSY
200.0000 ug | PREFILLED_SYRINGE | INTRAMUSCULAR | Status: DC
  Administered 2018-10-15: 200 ug via SUBCUTANEOUS

## 2018-10-22 DIAGNOSIS — M109 Gout, unspecified: Secondary | ICD-10-CM | POA: Diagnosis not present

## 2018-10-22 DIAGNOSIS — N185 Chronic kidney disease, stage 5: Secondary | ICD-10-CM | POA: Diagnosis not present

## 2018-10-23 DIAGNOSIS — D631 Anemia in chronic kidney disease: Secondary | ICD-10-CM | POA: Diagnosis not present

## 2018-10-23 DIAGNOSIS — N185 Chronic kidney disease, stage 5: Secondary | ICD-10-CM | POA: Diagnosis not present

## 2018-10-23 DIAGNOSIS — I12 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 stage renal disease: Secondary | ICD-10-CM | POA: Diagnosis not present

## 2018-11-01 ENCOUNTER — Encounter: Payer: Self-pay | Admitting: Family Medicine

## 2018-11-12 ENCOUNTER — Encounter (HOSPITAL_COMMUNITY)
Admission: RE | Admit: 2018-11-12 | Discharge: 2018-11-12 | Disposition: A | Discharge status: Home / Self Care | Payer: 59 | Source: Ambulatory Visit | Attending: Nephrology | Admitting: Nephrology

## 2018-11-12 ENCOUNTER — Other Ambulatory Visit: Payer: Self-pay

## 2018-11-12 VITALS — BP 134/88 | HR 73 | Temp 97.4°F | Resp 18

## 2018-11-12 DIAGNOSIS — N179 Acute kidney failure, unspecified: Secondary | ICD-10-CM | POA: Diagnosis not present

## 2018-11-12 LAB — IRON AND TIBC
Iron: 73 ug/dL (ref 45–182)
Saturation Ratios: 25 % (ref 17.9–39.5)
TIBC: 295 ug/dL (ref 250–450)
UIBC: 222 ug/dL

## 2018-11-12 LAB — FERRITIN: Ferritin: 555 ng/mL — ABNORMAL HIGH (ref 24–336)

## 2018-11-12 LAB — POCT HEMOGLOBIN-HEMACUE: Hemoglobin: 9.1 g/dL — ABNORMAL LOW (ref 13.0–17.0)

## 2018-11-12 MED ORDER — DARBEPOETIN ALFA 200 MCG/0.4ML IJ SOSY
200.0000 ug | PREFILLED_SYRINGE | INTRAMUSCULAR | Status: DC
  Administered 2018-11-12: 200 ug via SUBCUTANEOUS

## 2018-11-12 MED ORDER — DARBEPOETIN ALFA 200 MCG/0.4ML IJ SOSY
PREFILLED_SYRINGE | INTRAMUSCULAR | Status: AC
  Filled 2018-11-12: qty 0.4

## 2018-11-19 ENCOUNTER — Encounter (HOSPITAL_COMMUNITY): Payer: Self-pay

## 2018-12-10 ENCOUNTER — Other Ambulatory Visit: Payer: Self-pay

## 2018-12-10 ENCOUNTER — Ambulatory Visit (HOSPITAL_COMMUNITY)
Admission: RE | Admit: 2018-12-10 | Discharge: 2018-12-10 | Disposition: A | Discharge status: Home / Self Care | Payer: 59 | Source: Ambulatory Visit | Attending: Nephrology | Admitting: Nephrology

## 2018-12-10 VITALS — BP 142/90 | HR 69 | Temp 97.2°F | Resp 18

## 2018-12-10 DIAGNOSIS — N179 Acute kidney failure, unspecified: Secondary | ICD-10-CM | POA: Insufficient documentation

## 2018-12-10 LAB — IRON AND TIBC
Iron: 51 ug/dL (ref 45–182)
Saturation Ratios: 17 % — ABNORMAL LOW (ref 17.9–39.5)
TIBC: 305 ug/dL (ref 250–450)
UIBC: 254 ug/dL

## 2018-12-10 LAB — FERRITIN: Ferritin: 570 ng/mL — ABNORMAL HIGH (ref 24–336)

## 2018-12-10 LAB — POCT HEMOGLOBIN-HEMACUE: Hemoglobin: 8.7 g/dL — ABNORMAL LOW (ref 13.0–17.0)

## 2018-12-10 MED ORDER — DARBEPOETIN ALFA 200 MCG/0.4ML IJ SOSY
PREFILLED_SYRINGE | INTRAMUSCULAR | Status: AC
  Administered 2018-12-10: 09:00:00 200 ug via SUBCUTANEOUS
  Filled 2018-12-10: qty 0.4

## 2018-12-10 MED ORDER — DARBEPOETIN ALFA 300 MCG/0.6ML IJ SOSY: 225.0000 ug | PREFILLED_SYRINGE | INTRAMUSCULAR | Status: DC

## 2018-12-10 MED ORDER — DARBEPOETIN ALFA 25 MCG/0.42ML IJ SOSY
PREFILLED_SYRINGE | INTRAMUSCULAR | Status: AC
  Administered 2018-12-10: 25 ug via SUBCUTANEOUS
  Filled 2018-12-10: qty 0.42

## 2018-12-18 ENCOUNTER — Other Ambulatory Visit: Payer: Self-pay | Admitting: Urology

## 2018-12-21 ENCOUNTER — Other Ambulatory Visit (HOSPITAL_COMMUNITY): Payer: Self-pay

## 2018-12-24 ENCOUNTER — Other Ambulatory Visit: Payer: Self-pay

## 2018-12-24 ENCOUNTER — Ambulatory Visit (HOSPITAL_COMMUNITY)
Admission: RE | Admit: 2018-12-24 | Discharge: 2018-12-24 | Disposition: A | Discharge status: Home / Self Care | Payer: 59 | Source: Ambulatory Visit | Attending: Nephrology | Admitting: Nephrology

## 2018-12-24 VITALS — BP 144/90 | HR 72 | Temp 96.8°F | Resp 18 | Ht 74.0 in | Wt 263.0 lb

## 2018-12-24 DIAGNOSIS — N179 Acute kidney failure, unspecified: Secondary | ICD-10-CM | POA: Diagnosis not present

## 2018-12-24 LAB — POCT HEMOGLOBIN-HEMACUE: Hemoglobin: 9.6 g/dL — ABNORMAL LOW (ref 13.0–17.0)

## 2018-12-24 MED ORDER — DARBEPOETIN ALFA 25 MCG/0.42ML IJ SOSY
PREFILLED_SYRINGE | INTRAMUSCULAR | Status: AC
  Filled 2018-12-24: qty 0.42

## 2018-12-24 MED ORDER — SODIUM CHLORIDE 0.9 % IV SOLN
510.0000 mg | Freq: Once | INTRAVENOUS | Status: AC
  Administered 2018-12-24: 510 mg via INTRAVENOUS
  Filled 2018-12-24: qty 510

## 2018-12-24 MED ORDER — DARBEPOETIN ALFA 300 MCG/0.6ML IJ SOSY: 225.0000 ug | PREFILLED_SYRINGE | INTRAMUSCULAR | Status: DC

## 2018-12-24 MED ORDER — DARBEPOETIN ALFA 200 MCG/0.4ML IJ SOSY
200.0000 ug | PREFILLED_SYRINGE | Freq: Once | INTRAMUSCULAR | Status: AC
  Administered 2018-12-24: 200 ug via SUBCUTANEOUS

## 2018-12-24 MED ORDER — DARBEPOETIN ALFA 25 MCG/0.42ML IJ SOSY
25.0000 ug | PREFILLED_SYRINGE | Freq: Once | INTRAMUSCULAR | Status: AC
  Administered 2018-12-24: 10:00:00 25 ug via SUBCUTANEOUS

## 2018-12-24 MED ORDER — DARBEPOETIN ALFA 200 MCG/0.4ML IJ SOSY
PREFILLED_SYRINGE | INTRAMUSCULAR | Status: AC
  Filled 2018-12-24: qty 0.4

## 2018-12-28 ENCOUNTER — Encounter (HOSPITAL_COMMUNITY): Payer: Medicaid Other

## 2019-01-07 ENCOUNTER — Other Ambulatory Visit: Payer: Self-pay

## 2019-01-07 ENCOUNTER — Ambulatory Visit (HOSPITAL_COMMUNITY)
Admission: RE | Admit: 2019-01-07 | Discharge: 2019-01-07 | Disposition: A | Discharge status: Home / Self Care | Payer: 59 | Source: Ambulatory Visit | Attending: Nephrology | Admitting: Nephrology

## 2019-01-07 VITALS — BP 129/94 | HR 79 | Temp 98.8°F | Resp 20

## 2019-01-07 DIAGNOSIS — N179 Acute kidney failure, unspecified: Secondary | ICD-10-CM | POA: Diagnosis present

## 2019-01-07 LAB — POCT HEMOGLOBIN-HEMACUE: Hemoglobin: 10 g/dL — ABNORMAL LOW (ref 13.0–17.0)

## 2019-01-07 LAB — IRON AND TIBC
Iron: 44 ug/dL — ABNORMAL LOW (ref 45–182)
Saturation Ratios: 14 % — ABNORMAL LOW (ref 17.9–39.5)
TIBC: 316 ug/dL (ref 250–450)
UIBC: 272 ug/dL

## 2019-01-07 LAB — FERRITIN: Ferritin: 502 ng/mL — ABNORMAL HIGH (ref 24–336)

## 2019-01-07 MED ORDER — DARBEPOETIN ALFA 300 MCG/0.6ML IJ SOSY: 225.0000 ug | PREFILLED_SYRINGE | INTRAMUSCULAR | Status: DC

## 2019-01-07 MED ORDER — DARBEPOETIN ALFA 200 MCG/0.4ML IJ SOSY
PREFILLED_SYRINGE | INTRAMUSCULAR | Status: AC
  Administered 2019-01-07: 200 ug via SUBCUTANEOUS
  Filled 2019-01-07: qty 0.4

## 2019-01-07 MED ORDER — DARBEPOETIN ALFA 25 MCG/0.42ML IJ SOSY
PREFILLED_SYRINGE | INTRAMUSCULAR | Status: AC
  Administered 2019-01-07: 25 ug via SUBCUTANEOUS
  Filled 2019-01-07: qty 0.42

## 2019-01-10 ENCOUNTER — Other Ambulatory Visit: Payer: Self-pay | Admitting: Urology

## 2019-01-10 DIAGNOSIS — C7951 Secondary malignant neoplasm of bone: Secondary | ICD-10-CM

## 2019-01-11 ENCOUNTER — Ambulatory Visit (INDEPENDENT_AMBULATORY_CARE_PROVIDER_SITE_OTHER): Payer: 59 | Admitting: Family Medicine

## 2019-01-11 ENCOUNTER — Other Ambulatory Visit: Payer: Self-pay

## 2019-01-11 ENCOUNTER — Encounter: Payer: Self-pay | Admitting: Family Medicine

## 2019-01-11 VITALS — BP 140/92 | HR 78 | Temp 98.6°F | Wt 278.0 lb

## 2019-01-11 DIAGNOSIS — C61 Malignant neoplasm of prostate: Secondary | ICD-10-CM

## 2019-01-11 DIAGNOSIS — D649 Anemia, unspecified: Secondary | ICD-10-CM | POA: Diagnosis not present

## 2019-01-11 DIAGNOSIS — Z131 Encounter for screening for diabetes mellitus: Secondary | ICD-10-CM | POA: Diagnosis not present

## 2019-01-11 DIAGNOSIS — N184 Chronic kidney disease, stage 4 (severe): Secondary | ICD-10-CM | POA: Diagnosis not present

## 2019-01-11 DIAGNOSIS — C7951 Secondary malignant neoplasm of bone: Secondary | ICD-10-CM

## 2019-01-11 LAB — POCT GLYCOSYLATED HEMOGLOBIN (HGB A1C): Hemoglobin A1C: 5.1 % (ref 4.0–5.6)

## 2019-01-11 NOTE — Progress Notes (Signed)
   Subjective:    Patient ID: Devon Cook, male    DOB: 13-Apr-1961, 58 y.o.   MRN: [id number]  HPI He is here for follow-up visit.  He is presently being treated for metastatic prostate cancer with subsequent ureteral obstruction from the cancer.  He is now getting Lupron injections.  He is also in the process of getting evaluated for hemodialysis.  He is therefore followed by urology as well as renal.  He also is getting erythropoietin injections.  There was a question about him needing a machine to check his blood sugars.  Review of the record indicates no blood sugars that were really that elevated.   Review of Systems     Objective:   Physical Exam Alert and in no distress otherwise not examined Hemoglobin A1c is 5.1      Assessment & Plan:   Encounter Diagnoses  Name Primary?  . Screening for diabetes mellitus Yes  . Anemia, unspecified type   . Prostate cancer metastatic to bone ([place])   . Chronic renal failure, stage 4 (severe) ([facility])   I discussed the diagnosis with him concerning the prostate cancer and renal involvement.  He does have questions concerning starting hemodialysis.  I attempted to answer them as well as I could but explained that nephrology will do a better job with that.  I also explained that he has no evidence of diabetes.  He was comfortable with that.  He will call me if he has any concerns or questions about his care.

## 2019-01-15 ENCOUNTER — Other Ambulatory Visit (HOSPITAL_COMMUNITY)
Admission: RE | Admit: 2019-01-15 | Discharge: 2019-01-15 | Disposition: A | Discharge status: Home / Self Care | Payer: 59 | Source: Ambulatory Visit | Attending: Urology | Admitting: Urology

## 2019-01-15 DIAGNOSIS — Z1159 Encounter for screening for other viral diseases: Secondary | ICD-10-CM | POA: Diagnosis present

## 2019-01-15 LAB — SARS CORONAVIRUS 2 (TAT 6-24 HRS): SARS Coronavirus 2: NEGATIVE

## 2019-01-16 ENCOUNTER — Other Ambulatory Visit: Payer: Self-pay

## 2019-01-16 ENCOUNTER — Encounter (HOSPITAL_BASED_OUTPATIENT_CLINIC_OR_DEPARTMENT_OTHER): Payer: Self-pay | Admitting: *Deleted

## 2019-01-16 NOTE — Progress Notes (Addendum)
Spoke w/ pt via phone for pre-op interview.  Npo after mn.  Arrive at 0530.  Need istat 8 and ekg.  Pt had covid test done 01-15-2019.  Will take norvasc, protonix, sodium bicarb, and allopurinol am dos w/ sips of water.  Requested lov note/ lab results from nephrologsit, dr Lorrene Reid,  via fax.  Chart to be reviewed by anesthesia, Willeen Cass NP.  ADDENDUM:  Received dr Lorre Munroe note dated 12-27-2018, placed w/ chart.  Per anesthesia ok to proceed, Willeen Cass, NP.

## 2019-01-17 ENCOUNTER — Encounter (HOSPITAL_BASED_OUTPATIENT_CLINIC_OR_DEPARTMENT_OTHER): Payer: Self-pay | Admitting: *Deleted

## 2019-01-17 NOTE — Anesthesia Preprocedure Evaluation (Addendum)
Anesthesia Evaluation  Patient identified by MRN, date of birth, ID band Patient awake    Reviewed: Allergy & Precautions, NPO status   Airway Mallampati: II  TM Distance: >3 FB     Dental   Pulmonary Current Smoker,    breath sounds clear to auscultation       Cardiovascular hypertension,  Rhythm:Regular Rate:Normal     Neuro/Psych    GI/Hepatic GERD  ,(+) Hepatitis -  Endo/Other    Renal/GU Renal disease     Musculoskeletal   Abdominal   Peds  Hematology   Anesthesia Other Findings   Reproductive/Obstetrics                           Anesthesia Physical Anesthesia Plan  ASA: III  Anesthesia Plan: General   Post-op Pain Management:    Induction: Intravenous  PONV Risk Score and Plan: 1 and Ondansetron and Dexamethasone  Airway Management Planned: LMA  Additional Equipment:   Intra-op Plan:   Post-operative Plan: Extubation in OR  Informed Consent: I have reviewed the patients History and Physical, chart, labs and discussed the procedure including the risks, benefits and alternatives for the proposed anesthesia with the patient or authorized representative who has indicated his/her understanding and acceptance.     Dental advisory given  Plan Discussed with: CRNA and Anesthesiologist  Anesthesia Plan Comments: (See APP note by Willeen Cass, FNP)      Anesthesia Quick Evaluation

## 2019-01-17 NOTE — Progress Notes (Signed)
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 Current smoker.    PROVIDERS: - PCP is [name], MD - Nephrologist is Tye Savoy, MD. Last office visit 12/27/18. Creatinine was 7.61 at that visit.    LABS: Will be obtained day of surgery    EKG: Will be obtained day of surgery     Past Medical History:  Diagnosis Date  . Anemia, chronic renal failure    followed by dr Lorrene Reid--- dx 05/ 2019---  treated with Iron infusions and procrit  . Bilateral hydronephrosis    malignant-- chronic  . Chronic kidney disease, stage 5 [facility])    nephrologist-- dr c. 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 ED (erectile dysfunction)   . GERD (gastroesophageal reflux disease)   . Herpes genitalis in men   . History of hepatitis C 2017   treated with Harvoni  . History of lower GI bleeding 11/2017  . Hypertension   . Metabolic acidosis   . Prostate 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 [facility]) urologist-- dr Tresa Moore   dx 06/ 2019-- advanced w/ mets to bone and pelvic lymphadeopathy  . Secondary hyperparathyroidism of renal origin ([place])   . Wears glasses     Past Surgical History:  Procedure Laterality Date  . CYSTOSCOPY W/ URETERAL STENT PLACEMENT Bilateral 12/08/2017   Procedure: CYSTOSCOPY WITH RETROGRADE PYELOGRAM/URETERAL STENT PLACEMENT;  Surgeon: Alexis Frock, MD;  Location: [facility];  Service: Urology;  Laterality:  Bilateral;  . CYSTOSCOPY W/ URETERAL STENT PLACEMENT Bilateral 04/20/2018   Procedure: CYSTOSCOPY WITH STENT REPLACEMENT;  Surgeon: Alexis Frock, MD;  Location: [facility];  Service: Urology;  Laterality: Bilateral;  . CYSTOSCOPY W/ URETERAL STENT PLACEMENT Bilateral 08/10/2018   Procedure: CYSTOSCOPY WITH STENT REPLACEMENT, BILATERAL RETROGRADES;  Surgeon: Alexis Frock, MD;  Location: [facility];  Service: Urology;  Laterality: Bilateral;  45 MINS  . IR NEPHROSTOMY PLACEMENT LEFT  11/15/2017  . IR NEPHROSTOMY PLACEMENT RIGHT  11/15/2017  . PROSTATE BIOPSY N/A 12/08/2017   Procedure: BIOPSY TRANSRECTAL ULTRASONIC PROSTATE (TUBP), PERINEAL BIOPSY;  Surgeon: Alexis Frock, MD;  Location: [facility];  Service: Urology;  Laterality: N/A;  . TRANSURETHRAL RESECTION OF BLADDER TUMOR N/A 12/08/2017   Procedure: TRANSURETHRAL RESECTION OF BLADDER TUMOR (TURBT);  Surgeon: Alexis Frock, MD;  Location: [facility];  Service: Urology;  Laterality: N/A;  . TRICEPS TENDON REPAIR Bilateral 08/2005    MEDICATIONS: No current facility-administered medications for this encounter.    Marland Kitchen acetaminophen (TYLENOL) 325 MG tablet  . allopurinol (ZYLOPRIM) 100 MG tablet  . amLODipine (NORVASC) 10 MG tablet  . epoetin alfa (EPOGEN,PROCRIT) 2000 UNIT/ML injection  . Multiple Vitamins-Minerals (MULTIVITAMIN WITH MINERALS) tablet  . pantoprazole (PROTONIX) 40 MG tablet  . sodium bicarbonate 650 MG tablet    If labs and EKG acceptable day of surgery, I anticipate pt can proceed with surgery as scheduled.  Willeen Cass, FNP-BC [facility]  [facility]/Anesthesiology Phone: [phone] 01/17/2019 1:17 PM

## 2019-01-18 ENCOUNTER — Other Ambulatory Visit: Payer: Self-pay

## 2019-01-18 ENCOUNTER — Ambulatory Visit (HOSPITAL_BASED_OUTPATIENT_CLINIC_OR_DEPARTMENT_OTHER): Payer: 59 | Admitting: Emergency Medicine

## 2019-01-18 ENCOUNTER — Encounter (HOSPITAL_BASED_OUTPATIENT_CLINIC_OR_DEPARTMENT_OTHER): Payer: Self-pay

## 2019-01-18 ENCOUNTER — Encounter (HOSPITAL_BASED_OUTPATIENT_CLINIC_OR_DEPARTMENT_OTHER)
Admission: RE | Disposition: A | Discharge status: Home / Self Care | Payer: Self-pay | Source: Home / Self Care | Attending: Urology

## 2019-01-18 ENCOUNTER — Ambulatory Visit (HOSPITAL_BASED_OUTPATIENT_CLINIC_OR_DEPARTMENT_OTHER)
Admission: RE | Admit: 2019-01-18 | Discharge: 2019-01-18 | Disposition: A | Discharge status: Home / Self Care | Payer: 59 | Attending: Urology | Admitting: Urology

## 2019-01-18 DIAGNOSIS — K219 Gastro-esophageal reflux disease without esophagitis: Secondary | ICD-10-CM | POA: Diagnosis not present

## 2019-01-18 DIAGNOSIS — C61 Malignant neoplasm of prostate: Secondary | ICD-10-CM | POA: Diagnosis not present

## 2019-01-18 DIAGNOSIS — N179 Acute kidney failure, unspecified: Secondary | ICD-10-CM | POA: Diagnosis not present

## 2019-01-18 DIAGNOSIS — N185 Chronic kidney disease, stage 5: Secondary | ICD-10-CM | POA: Diagnosis not present

## 2019-01-18 DIAGNOSIS — I12 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 stage renal disease: Secondary | ICD-10-CM | POA: Insufficient documentation

## 2019-01-18 DIAGNOSIS — C7951 Secondary malignant neoplasm of bone: Secondary | ICD-10-CM | POA: Insufficient documentation

## 2019-01-18 DIAGNOSIS — D631 Anemia in chronic kidney disease: Secondary | ICD-10-CM | POA: Diagnosis not present

## 2019-01-18 DIAGNOSIS — F17290 Nicotine dependence, other tobacco product, uncomplicated: Secondary | ICD-10-CM | POA: Insufficient documentation

## 2019-01-18 DIAGNOSIS — N131 Hydronephrosis with ureteral stricture, not elsewhere classified: Secondary | ICD-10-CM | POA: Diagnosis present

## 2019-01-18 DIAGNOSIS — R590 Localized enlarged lymph nodes: Secondary | ICD-10-CM | POA: Diagnosis not present

## 2019-01-18 DIAGNOSIS — F17210 Nicotine dependence, cigarettes, uncomplicated: Secondary | ICD-10-CM | POA: Insufficient documentation

## 2019-01-18 DIAGNOSIS — Z79899 Other long term (current) drug therapy: Secondary | ICD-10-CM | POA: Insufficient documentation

## 2019-01-18 HISTORY — DX: Secondary hyperparathyroidism of renal origin: N25.81

## 2019-01-18 HISTORY — DX: Unspecified hydronephrosis: N13.30

## 2019-01-18 HISTORY — DX: Anemia in chronic kidney disease: N18.9

## 2019-01-18 HISTORY — DX: Anemia in chronic kidney disease: D63.1

## 2019-01-18 HISTORY — PX: CYSTOSCOPY W/ URETERAL STENT PLACEMENT: SHX1429

## 2019-01-18 HISTORY — DX: Malignant neoplasm of prostate: C61

## 2019-01-18 LAB — POCT I-STAT, CHEM 8
BUN: 69 mg/dL — ABNORMAL HIGH (ref 6–20)
Calcium, Ion: 1.33 mmol/L (ref 1.15–1.40)
Chloride: 108 mmol/L (ref 98–111)
Creatinine, Ser: 7.7 mg/dL — ABNORMAL HIGH (ref 0.61–1.24)
Glucose, Bld: 97 mg/dL (ref 70–99)
HCT: 33 % — ABNORMAL LOW (ref 39.0–52.0)
Hemoglobin: 11.2 g/dL — ABNORMAL LOW (ref 13.0–17.0)
Potassium: 4.3 mmol/L (ref 3.5–5.1)
Sodium: 140 mmol/L (ref 135–145)
TCO2: 22 mmol/L (ref 22–32)

## 2019-01-18 SURGERY — CYSTOSCOPY, WITH RETROGRADE PYELOGRAM AND URETERAL STENT INSERTION
Anesthesia: General | Site: Ureter | Laterality: Bilateral

## 2019-01-18 MED ORDER — MIDAZOLAM HCL 5 MG/5ML IJ SOLN
INTRAMUSCULAR | Status: DC | PRN
  Administered 2019-01-18: 2 mg via INTRAVENOUS

## 2019-01-18 MED ORDER — SODIUM CHLORIDE 0.9 % IV SOLN
INTRAVENOUS | Status: AC
  Filled 2019-01-18: qty 100

## 2019-01-18 MED ORDER — DEXAMETHASONE SODIUM PHOSPHATE 10 MG/ML IJ SOLN
INTRAMUSCULAR | Status: DC | PRN
  Administered 2019-01-18: 4 mg via INTRAVENOUS

## 2019-01-18 MED ORDER — PROPOFOL 10 MG/ML IV BOLUS
INTRAVENOUS | Status: DC | PRN
  Administered 2019-01-18: 200 mg via INTRAVENOUS

## 2019-01-18 MED ORDER — LIDOCAINE 2% (20 MG/ML) 5 ML SYRINGE
INTRAMUSCULAR | Status: DC | PRN
  Administered 2019-01-18: 100 mg via INTRAVENOUS

## 2019-01-18 MED ORDER — MIDAZOLAM HCL 2 MG/2ML IJ SOLN
INTRAMUSCULAR | Status: AC
  Filled 2019-01-18: qty 2

## 2019-01-18 MED ORDER — OXYCODONE-ACETAMINOPHEN 5-325 MG PO TABS: 1.0000 | ORAL_TABLET | Freq: Four times a day (QID) | ORAL | 0 refills | Status: DC | PRN

## 2019-01-18 MED ORDER — FENTANYL CITRATE (PF) 100 MCG/2ML IJ SOLN
INTRAMUSCULAR | Status: DC | PRN
  Administered 2019-01-18 (×2): 50 ug via INTRAVENOUS

## 2019-01-18 MED ORDER — IOHEXOL 300 MG/ML  SOLN
INTRAMUSCULAR | Status: DC | PRN
  Administered 2019-01-18: 20 mL

## 2019-01-18 MED ORDER — CEFTRIAXONE SODIUM 2 G IJ SOLR
INTRAMUSCULAR | Status: AC
  Filled 2019-01-18: qty 20

## 2019-01-18 MED ORDER — ONDANSETRON HCL 4 MG/2ML IJ SOLN
INTRAMUSCULAR | Status: DC | PRN
  Administered 2019-01-18: 4 mg via INTRAVENOUS

## 2019-01-18 MED ORDER — SODIUM CHLORIDE 0.9 % IV SOLN
INTRAVENOUS | Status: DC
  Administered 2019-01-18: 50 mL/h via INTRAVENOUS
  Filled 2019-01-18: qty 1000

## 2019-01-18 MED ORDER — LIDOCAINE 2% (20 MG/ML) 5 ML SYRINGE
INTRAMUSCULAR | Status: AC
  Filled 2019-01-18: qty 5

## 2019-01-18 MED ORDER — ONDANSETRON HCL 4 MG/2ML IJ SOLN
INTRAMUSCULAR | Status: AC
  Filled 2019-01-18: qty 2

## 2019-01-18 MED ORDER — PROPOFOL 10 MG/ML IV BOLUS
INTRAVENOUS | Status: AC
  Filled 2019-01-18: qty 40

## 2019-01-18 MED ORDER — FENTANYL CITRATE (PF) 100 MCG/2ML IJ SOLN
25.0000 ug | INTRAMUSCULAR | Status: DC | PRN
  Filled 2019-01-18: qty 1

## 2019-01-18 MED ORDER — DEXAMETHASONE SODIUM PHOSPHATE 10 MG/ML IJ SOLN
INTRAMUSCULAR | Status: AC
  Filled 2019-01-18: qty 1

## 2019-01-18 MED ORDER — FENTANYL CITRATE (PF) 100 MCG/2ML IJ SOLN
INTRAMUSCULAR | Status: AC
  Filled 2019-01-18: qty 2

## 2019-01-18 MED ORDER — SODIUM CHLORIDE 0.9 % IR SOLN
Status: DC | PRN
  Administered 2019-01-18: 3000 mL via INTRAVESICAL

## 2019-01-18 MED ORDER — SODIUM CHLORIDE 0.9 % IV SOLN
2.0000 g | INTRAVENOUS | Status: AC
  Administered 2019-01-18: 07:00:00 2 g via INTRAVENOUS
  Filled 2019-01-18: qty 20

## 2019-01-18 SURGICAL SUPPLY — 20 items
BAG DRAIN URO-CYSTO SKYTR STRL (DRAIN) ×3 IMPLANT
CATH INTERMIT  6FR 70CM (CATHETERS) IMPLANT
CLOTH BEACON ORANGE TIMEOUT ST (SAFETY) ×3 IMPLANT
GLOVE BIO SURGEON STRL SZ7 (GLOVE) ×2 IMPLANT
GLOVE BIO SURGEON STRL SZ7.5 (GLOVE) ×3 IMPLANT
GLOVE BIOGEL PI IND STRL 7.5 (GLOVE) IMPLANT
GLOVE BIOGEL PI INDICATOR 7.5 (GLOVE) ×4
GOWN STRL REUS W/TWL LRG LVL3 (GOWN DISPOSABLE) ×3 IMPLANT
GOWN STRL REUS W/TWL XL LVL3 (GOWN DISPOSABLE) ×4 IMPLANT
GUIDEWIRE ANG ZIPWIRE 038X150 (WIRE) ×3 IMPLANT
GUIDEWIRE STR DUAL SENSOR (WIRE) ×3 IMPLANT
IV NS IRRIG 3000ML ARTHROMATIC (IV SOLUTION) ×3 IMPLANT
KIT TURNOVER CYSTO (KITS) ×3 IMPLANT
MANIFOLD NEPTUNE II (INSTRUMENTS) ×3 IMPLANT
PACK CYSTO (CUSTOM PROCEDURE TRAY) ×3 IMPLANT
STENT CONTOUR 7FRX26 (STENTS) ×4 IMPLANT
SYR 10ML LL (SYRINGE) ×3 IMPLANT
TUBE CONNECTING 12'X1/4 (SUCTIONS) ×1
TUBE CONNECTING 12X1/4 (SUCTIONS) ×1 IMPLANT
WATER STERILE IRR 500ML POUR (IV SOLUTION) ×2 IMPLANT

## 2019-01-18 NOTE — Anesthesia Procedure Notes (Signed)
Procedure Name: LMA Insertion Date/Time: 01/18/2019 7:31 AM Performed by: Bonney Aid, CRNA Pre-anesthesia Checklist: Patient identified, Emergency Drugs available, Suction available and Patient being monitored Patient Re-evaluated:Patient Re-evaluated prior to induction Oxygen Delivery Method: Circle system utilized Preoxygenation: Pre-oxygenation with 100% oxygen Induction Type: IV induction Ventilation: Mask ventilation without difficulty LMA: LMA inserted LMA Size: 5.0 Number of attempts: 1 Airway Equipment and Method: Bite block Placement Confirmation: positive ETCO2 Tube secured with: Tape Dental Injury: Teeth and Oropharynx as per pre-operative assessment

## 2019-01-18 NOTE — H&P (Signed)
Devon Cook is an 58 y.o. male.    Chief Complaint: Pre-Op BILATERAL Ureteral Stent Exchange  HPI:   1 - Malignant Ureteral Obstruction - Cr 17 by PCP labs on eval malaize 11/2017. Non-con CT with hydro to level of thickened bladder. Bilateral neph tubes placed 11/2017 and converted to bilateral 7x26 stents 12/2017 in OR. Now established with Devon Cook kidney with GFR 15 range, requires aranesp and PRN transfusion.   2 - Metastatic Prostate Cancer - PSA 787 11/2017 with CT with pelvic lymphadenopathy (non-bulky) and small L3 vertebral met c/w metastatic prostate cancer. OR BX 12/2017 confirms Gleason 9 and 10 disease.   Recent Course:  01/2018 Lupron 11m; 03/2018 - PSA 0.26 / T 15  07/2018 Lupron 45 PSA/T - PSA 0.07; 08/2018 OR Exchange bilat stents, Cr 6.2, much improved bladder neck / prostate nodular tissue  01/2019 - PSA 0.016 / T <10 ==> Lupron 45   3- Chronic Renal Insufficiency - Cr 4-6's after bilateral renal decompression c/w permanent renal damage. Now established with Dr. DLorrene Reidat Devon Cook   PMH sig for pre-DM, obesity, L spine arthritis. NO isschemic CV disease / blood thinners.   Today "Devon Cook is seen for bilateral ureteral stent exchange. No interval fevers.    Past Medical History:  Diagnosis Date  . Anemia, chronic renal failure    followed by dr Devon Cook-- dx 05/ 2019---  treated with Iron infusions and procrit  . Bilateral hydronephrosis    malignant-- chronic  . Chronic kidney disease, stage 5 (Devon Cook    nephrologist-- dr c. Devon Cook- secondary to obstructive uropathy secondary to prostate cancer  . ED (erectile dysfunction)   . GERD (gastroesophageal reflux disease)   . Herpes genitalis in men   . History of hepatitis C 2017   treated with Harvoni  . History of lower GI bleeding 11/2017  . Hypertension   . Metabolic acidosis   . Prostate 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 (Devon Cook urologist-- dr Devon Cook  dx 06/ 2019-- advanced w/ mets to bone and pelvic  lymphadeopathy  . Secondary hyperparathyroidism of renal origin ([place]   . Wears glasses     Past Surgical History:  Procedure Laterality Date  . CYSTOSCOPY W/ URETERAL STENT PLACEMENT Bilateral 12/08/2017   Procedure: CYSTOSCOPY WITH RETROGRADE PYELOGRAM/URETERAL STENT PLACEMENT;  Surgeon: Devon Cook;  Location: [facility];  Service: Urology;  Laterality: Bilateral;  . CYSTOSCOPY W/ URETERAL STENT PLACEMENT Bilateral 04/20/2018   Procedure: CYSTOSCOPY WITH STENT REPLACEMENT;  Surgeon: Devon Cook;  Location: [facility]  Service: Urology;  Laterality: Bilateral;  . CYSTOSCOPY W/ URETERAL STENT PLACEMENT Bilateral 08/10/2018   Procedure: CYSTOSCOPY WITH STENT REPLACEMENT, BILATERAL RETROGRADES;  Surgeon: Devon Cook;  Location: [facility]  Service: Urology;  Laterality: Bilateral;  45 MINS  . IR NEPHROSTOMY PLACEMENT LEFT  11/15/2017  . IR NEPHROSTOMY PLACEMENT RIGHT  11/15/2017  . PROSTATE BIOPSY N/A 12/08/2017   Procedure: BIOPSY TRANSRECTAL ULTRASONIC PROSTATE (TUBP), PERINEAL BIOPSY;  Surgeon: Devon Cook;  Location: [facility];  Service: Urology;  Laterality: N/A;  . TRANSURETHRAL RESECTION OF BLADDER TUMOR N/A 12/08/2017   Procedure: TRANSURETHRAL RESECTION OF BLADDER TUMOR (TURBT);  Surgeon: Devon Cook;  Location: [facility];  Service: Urology;  Laterality: N/A;  . TRICEPS TENDON REPAIR Bilateral 08/2005    History reviewed. No pertinent family history. Social History:  reports that he has been smoking cigarettes and pipe. He has a 3.00 pack-year smoking history. He has never used  smokeless tobacco. He reports current alcohol use. He reports that he does not use drugs.  Allergies: No Known Allergies  Medications Prior to Admission  Medication Sig Dispense Refill  . acetaminophen (TYLENOL) 325 MG tablet Take 2 tablets (650 mg total) by mouth every 6 (six) hours as needed for mild pain (or Fever >/= 101).    Marland Kitchen allopurinol  (ZYLOPRIM) 100 MG tablet TAKE 1 TABLET BY MOUTH ONCE DAILY (Patient taking differently: Take 100 mg by mouth 2 (two) times daily. ) 30 tablet 0  . amLODipine (NORVASC) 10 MG tablet Take 10 mg by mouth every morning.     Marland Kitchen epoetin alfa (EPOGEN,PROCRIT) 2000 UNIT/ML injection 2,000 Units. Every 2 weeks    . Multiple Vitamins-Minerals (MULTIVITAMIN WITH MINERALS) tablet Take 1 tablet by mouth daily.      . pantoprazole (PROTONIX) 40 MG tablet TAKE 1 TABLET BY MOUTH ONCE DAILY (Patient taking differently: Take 40 mg by mouth every morning. ) 30 tablet 0  . sodium bicarbonate 650 MG tablet Take 2 tablets (1,300 mg total) by mouth 2 (two) times daily. (Patient taking differently: Take 1,300 mg by mouth 3 (three) times daily. ) 90 tablet 1    No results found for this or any previous visit (from the past 48 hour(s)). No results found.  Review of Systems  Constitutional: Negative for chills and fever.  Genitourinary: Positive for hematuria.    Blood pressure (!) 169/103, pulse 80, temperature 98.3 F (36.8 C), temperature source Oral, resp. rate 18, height _0  (1.88 m), weight 126.7 kg, SpO2 100 %. Physical Exam  Constitutional: He appears well-developed.  HENT:  Head: Normocephalic.  Eyes: Pupils are equal, round, and reactive to light.  Neck: Normal range of motion.  Cardiovascular: Normal rate.  Respiratory: Effort normal.  GI:  Stigmata of weight loss  Genitourinary:    Genitourinary Comments: No CVAT at present.    Musculoskeletal: Normal range of motion.  Neurological: He is alert.  Skin: Skin is warm.  Psychiatric: He has a normal mood and affect.     Assessment/Plan  Proceed as planned with cysto, BILATERAL retrogrades and stent exchante. Risks, benefits, alternatives, expected peri-op course discussed previously and reiterated today.   Devon Frock, Cook 01/18/2019, 6:37 AM

## 2019-01-18 NOTE — Addendum Note (Signed)
Addendum  created 01/18/19 0956 by Bonney Aid, CRNA   Intraprocedure Flowsheets edited

## 2019-01-18 NOTE — Op Note (Signed)
NAME: Devon Cook, HALLS MEDICAL RECORD VC:[id number] ACCOUNT [id number] DATE OF BIRTH:March 11, 1961 FACILITY: [facility] LOCATION: [facility] PHYSICIAN:Charlynn Salih Tresa Moore, MD  OPERATIVE REPORT  DATE OF PROCEDURE:  01/18/2019  PREOPERATIVE DIAGNOSIS:  Metastatic prostate cancer with malignant ureteral obstruction.  PROCEDURE: 1.  Cystoscopy, bilateral pyelograms, interpretation. 2.  Exchange of bilateral ureteral stents 7 x 26 Contour, no tether.  ESTIMATED BLOOD LOSS:  Nil.  COMPLICATIONS:  None.  SPECIMENS:  None.  FINDINGS: 1.  Moderate bilateral hydronephrosis. 2.  Successful exchange of bilateral ureteral stents proximal and upper pole distal end in urinary bladder.  INDICATIONS:  The patient is a very pleasant but unfortunate 58 year old man with history of metastatic prostate cancer diagnosed a little over a year ago.  At presentation, he was in acute renal failure with a malignant obstruction and large volume  cancer.  He has been managed with bilateral stent exchange for his ____ and androgen deprivation for his cancer and he has had a dramatic response.  He does best with stent changes approximately every 4 months.  He is due for this.  Informed consent was  obtained and placed in medical record.  DESCRIPTION OF PROCEDURE:  The patient was identified.  The procedure being bilateral ureteral stent exchange was confirmed.  Procedure timeout was performed.  Intravenous antibiotics administered, general LMA anesthesia induced.  The patient was placed  into a low lithotomy position, sterile field was created prepped and draped base of the penis, perineum and proximal thighs using iodine.  Cystourethroscopy was performed using 22-French rigid cystoscope with offset lens.  Inspection of anterior and  posterior urethra were unremarkable.  Inspection of bladder revealed distal end of bilateral stents in situ.  There was minimal encrustation.  Distal end of the right stent was grasped, brought to  the level of the urethral meatus.  A 0.038 ZIPwire was  advanced to the lower pole exchanged for open-ended catheter and right retropyelogram was obtained.  A right pyelogram demonstrated a single right ureter with single system right kidney.  There was moderate hydronephrosis to the distal ureter consistent with known malignant obstruction.  Next, a new 7 x 26 Contour Polaris-type stent was placed over the  sensor working wire using cystoscopic and fluoroscopic guidance.  Good proximal and distal plane were noted.  There was efflux of urine seen around into the distal end of the stent.  Similarly, the left distal stent was grasped, brought to the level of  the urethral meatus and exchanged over a sensor working wire for the open-ended catheter and left retrograde pyelogram was obtained.  Left retrograde pyelogram demonstrated a single left ureter single system left kidney.  There was moderate hydronephrosis to the level of the distal ureter and a new 7 x 26 contour type stent was placed using cystoscopic and fluoroscopic guidance.  Good  proximal and distal plane were noted.  There was efflux of urine seen around and through the distal end of the stent.  The bladder was entered per cystoscope.  Procedure terminated.  The patient tolerated the procedure well.  No immediate perioperative  complications.  The patient was taken to the postanesthesia care in stable condition.  The removed stents were inspected and intact.  TN/NUANCE  D:01/18/2019 T:01/18/2019 JOB:[id number]

## 2019-01-18 NOTE — Discharge Instructions (Signed)
1 - You may have urinary urgency (bladder spasms) and bloody urine on / off with stent in place. This is normal.  2 - Call MD or go to ER for fever >102, severe pain / nausea / vomiting not relieved by medications, or acute change in medical status  CYSTOSCOPY HOME CARE INSTRUCTIONS  Activity: Rest for the remainder of the day.  Do not drive or operate equipment today.  You may resume normal activities in one to two days as instructed by your physician.   Meals: Drink plenty of liquids and eat light foods such as gelatin or soup this evening.  You may return to a normal meal plan tomorrow.  Return to Work: You may return to work in one to two days or as instructed by your physician.  Special Instructions / Symptoms: Call your physician if any of these symptoms occur:   -persistent or heavy bleeding  -bleeding which continues after first few urination  -large blood clots that are difficult to pass  -urine stream diminishes or stops completely  -fever equal to or higher than 101 degrees Farenheit.  -cloudy urine with a strong, foul odor  -severe pain    You may feel some burning pain when you urinate.  This should disappear with time.  Applying moist heat to the lower abdomen or a hot tub bath may help relieve the pain.    Post Anesthesia Home Care Instructions  Activity: Get plenty of rest for the remainder of the day. A responsible adult should stay with you for 24 hours following the procedure.  For the next 24 hours, DO NOT: -Drive a car -Paediatric nurse -Drink alcoholic beverages -Take any medication unless instructed by your physician -Make any legal decisions or sign important papers.  Meals: Start with liquid foods such as gelatin or soup. Progress to regular foods as tolerated. Avoid greasy, spicy, heavy foods. If nausea and/or vomiting occur, drink only clear liquids until the nausea and/or vomiting subsides. Call your physician if vomiting continues.  Special  Instructions/Symptoms: Your throat may feel dry or sore from the anesthesia or the breathing tube placed in your throat during surgery. If this causes discomfort, gargle with warm salt water. The discomfort should disappear within 24 hours.  If you had a scopolamine patch placed behind your ear for the management of post- operative nausea and/or vomiting:  1. The medication in the patch is effective for 72 hours, after which it should be removed.  Wrap patch in a tissue and discard in the trash. Wash hands thoroughly with soap and water. 2. You may remove the patch earlier than 72 hours if you experience unpleasant side effects which may include dry mouth, dizziness or visual disturbances. 3. Avoid touching the patch. Wash your hands with soap and water after contact with the patch.

## 2019-01-18 NOTE — Anesthesia Postprocedure Evaluation (Signed)
Anesthesia Post Note  Patient: Devon Cook  Procedure(s) Performed: CYSTOSCOPY WITH RETROGRADE PYELOGRAM/URETERAL STENT PLACEMENT (Bilateral Ureter)     Patient location during evaluation: PACU Anesthesia Type: General Level of consciousness: awake Pain management: pain level controlled Vital Signs Assessment: post-procedure vital signs reviewed and stable Respiratory status: spontaneous breathing Cardiovascular status: stable Postop Assessment: no apparent nausea or vomiting Anesthetic complications: no    Last Vitals:  Vitals:   01/18/19 0815 01/18/19 0830  BP: 134/83 (!) 140/94  Pulse: 71 67  Resp: 17 16  Temp:    SpO2: 100% 97%    Last Pain:  Vitals:   01/18/19 0805  TempSrc:   PainSc: 0-No pain                 Jaiya Mooradian

## 2019-01-18 NOTE — Brief Op Note (Signed)
01/18/2019  7:58 AM  PATIENT:  Devon Cook  58 y.o. male  PRE-OPERATIVE DIAGNOSIS:  MALIGNANT HYDRONEPHROSIS  POST-OPERATIVE DIAGNOSIS:  MALIGNANT HYDRONEPHROSIS  PROCEDURE:  Procedure(s): CYSTOSCOPY WITH RETROGRADE PYELOGRAM/URETERAL STENT PLACEMENT (Bilateral)  SURGEON:  Surgeon(s) and Role:    * Alexis Frock, MD - Primary  PHYSICIAN ASSISTANT:   ASSISTANTS: none   ANESTHESIA:   general  EBL:  minimal   BLOOD ADMINISTERED:none  DRAINS: none   LOCAL MEDICATIONS USED:  NONE  SPECIMEN:  No Specimen  DISPOSITION OF SPECIMEN:  N/A  COUNTS:  YES  TOURNIQUET:  * No tourniquets in log *  DICTATION: .Other Dictation: Dictation Number [id number]  PLAN OF CARE: Discharge to home after PACU  PATIENT DISPOSITION:  PACU - hemodynamically stable.   Delay start of Pharmacological VTE agent (>24hrs) due to surgical blood loss or risk of bleeding: yes

## 2019-01-18 NOTE — Transfer of Care (Signed)
Immediate Anesthesia Transfer of Care Note  Patient: Devon Cook  Procedure(s) Performed: CYSTOSCOPY WITH RETROGRADE PYELOGRAM/URETERAL STENT PLACEMENT (Bilateral Ureter)  Patient Location: PACU  Anesthesia Type:General  Level of Consciousness: awake, alert  and oriented  Airway & Oxygen Therapy: Patient Spontanous Breathing and Patient connected to nasal cannula oxygen  Post-op Assessment: Report given to RN  Post vital signs: Reviewed and stable  Last Vitals:  Vitals Value Taken Time  BP 130/89 01/18/19 0805  Temp    Pulse 70 01/18/19 0806  Resp 18 01/18/19 0806  SpO2 100 % 01/18/19 0806  Vitals shown include unvalidated device data.  Last Pain:  Vitals:   01/18/19 0602  TempSrc:   PainSc: 0-No pain      Patients Stated Pain Goal: 5 (67/25/50 0164)  Complications: No apparent anesthesia complications

## 2019-01-21 ENCOUNTER — Encounter (HOSPITAL_BASED_OUTPATIENT_CLINIC_OR_DEPARTMENT_OTHER): Payer: Self-pay | Admitting: Urology

## 2019-01-21 ENCOUNTER — Encounter (HOSPITAL_COMMUNITY): Payer: Medicaid Other

## 2019-01-24 ENCOUNTER — Ambulatory Visit (HOSPITAL_COMMUNITY)
Admission: RE | Admit: 2019-01-24 | Discharge: 2019-01-24 | Disposition: A | Discharge status: Home / Self Care | Payer: 59 | Source: Ambulatory Visit | Attending: Nephrology | Admitting: Nephrology

## 2019-01-24 ENCOUNTER — Other Ambulatory Visit: Payer: Self-pay

## 2019-01-24 VITALS — BP 141/99 | HR 73 | Temp 97.0°F | Resp 18

## 2019-01-24 DIAGNOSIS — N179 Acute kidney failure, unspecified: Secondary | ICD-10-CM | POA: Diagnosis present

## 2019-01-24 LAB — POCT HEMOGLOBIN-HEMACUE: Hemoglobin: 11.1 g/dL — ABNORMAL LOW (ref 13.0–17.0)

## 2019-01-24 MED ORDER — DARBEPOETIN ALFA 200 MCG/0.4ML IJ SOSY
PREFILLED_SYRINGE | INTRAMUSCULAR | Status: AC
  Administered 2019-01-24: 200 ug via SUBCUTANEOUS
  Filled 2019-01-24: qty 0.4

## 2019-01-24 MED ORDER — DARBEPOETIN ALFA 25 MCG/0.42ML IJ SOSY
PREFILLED_SYRINGE | INTRAMUSCULAR | Status: AC
  Administered 2019-01-24: 25 ug via SUBCUTANEOUS
  Filled 2019-01-24: qty 0.42

## 2019-01-24 MED ORDER — DARBEPOETIN ALFA 300 MCG/0.6ML IJ SOSY: 225.0000 ug | PREFILLED_SYRINGE | INTRAMUSCULAR | Status: DC

## 2019-02-06 ENCOUNTER — Other Ambulatory Visit: Payer: Self-pay

## 2019-02-07 ENCOUNTER — Other Ambulatory Visit (HOSPITAL_COMMUNITY): Payer: Self-pay | Admitting: *Deleted

## 2019-02-07 ENCOUNTER — Ambulatory Visit (HOSPITAL_COMMUNITY)
Admission: RE | Admit: 2019-02-07 | Discharge: 2019-02-07 | Disposition: A | Discharge status: Home / Self Care | Payer: 59 | Source: Ambulatory Visit | Attending: Nephrology | Admitting: Nephrology

## 2019-02-07 VITALS — BP 151/99 | HR 71 | Temp 97.5°F | Resp 18 | Ht 74.0 in | Wt 272.0 lb

## 2019-02-07 DIAGNOSIS — N179 Acute kidney failure, unspecified: Secondary | ICD-10-CM | POA: Insufficient documentation

## 2019-02-07 LAB — RENAL FUNCTION PANEL
Albumin: 3.1 g/dL — ABNORMAL LOW (ref 3.5–5.0)
Anion gap: 13 (ref 5–15)
BUN: 86 mg/dL — ABNORMAL HIGH (ref 6–20)
CO2: 19 mmol/L — ABNORMAL LOW (ref 22–32)
Calcium: 9.4 mg/dL (ref 8.9–10.3)
Chloride: 104 mmol/L (ref 98–111)
Creatinine, Ser: 8.3 mg/dL — ABNORMAL HIGH (ref 0.61–1.24)
GFR calc Af Amer: 7 mL/min — ABNORMAL LOW (ref >60)
GFR calc non Af Amer: 6 mL/min — ABNORMAL LOW (ref >60)
Glucose, Bld: 98 mg/dL (ref 70–99)
Phosphorus: 7 mg/dL — ABNORMAL HIGH (ref 2.5–4.6)
Potassium: 4.8 mmol/L (ref 3.5–5.1)
Sodium: 136 mmol/L (ref 135–145)

## 2019-02-07 LAB — URIC ACID: Uric Acid, Serum: 5.4 mg/dL (ref 3.7–8.6)

## 2019-02-07 LAB — POCT HEMOGLOBIN-HEMACUE: Hemoglobin: 10 g/dL — ABNORMAL LOW (ref 13.0–17.0)

## 2019-02-07 MED ORDER — DARBEPOETIN ALFA 200 MCG/0.4ML IJ SOSY
PREFILLED_SYRINGE | INTRAMUSCULAR | Status: AC
  Filled 2019-02-07: qty 0.4

## 2019-02-07 MED ORDER — SODIUM CHLORIDE 0.9 % IV SOLN
510.0000 mg | Freq: Once | INTRAVENOUS | Status: AC
  Administered 2019-02-07: 510 mg via INTRAVENOUS

## 2019-02-07 MED ORDER — DARBEPOETIN ALFA 25 MCG/0.42ML IJ SOSY
PREFILLED_SYRINGE | INTRAMUSCULAR | Status: AC
  Administered 2019-02-07: 25 ug via SUBCUTANEOUS
  Filled 2019-02-07: qty 0.42

## 2019-02-07 MED ORDER — DARBEPOETIN ALFA 300 MCG/0.6ML IJ SOSY
225.0000 ug | PREFILLED_SYRINGE | INTRAMUSCULAR | Status: DC
  Administered 2019-02-07: 200 ug via SUBCUTANEOUS

## 2019-02-07 MED ORDER — SODIUM CHLORIDE 0.9 % IV SOLN
510.0000 mg | Freq: Once | INTRAVENOUS | Status: DC
  Filled 2019-02-07: qty 17

## 2019-02-08 LAB — PARATHYROID HORMONE, INTACT (NO CA): PTH: 81 pg/mL — ABNORMAL HIGH (ref 15–65)

## 2019-02-12 ENCOUNTER — Other Ambulatory Visit: Payer: Self-pay

## 2019-02-12 DIAGNOSIS — N179 Acute kidney failure, unspecified: Secondary | ICD-10-CM

## 2019-02-14 MED FILL — Ferumoxytol Inj 510 MG/17ML (30 MG/ML) (Elemental Fe): INTRAVENOUS | Qty: 510 | Status: AC

## 2019-02-19 ENCOUNTER — Ambulatory Visit (INDEPENDENT_AMBULATORY_CARE_PROVIDER_SITE_OTHER): Payer: 59 | Admitting: Vascular Surgery

## 2019-02-19 ENCOUNTER — Ambulatory Visit (INDEPENDENT_AMBULATORY_CARE_PROVIDER_SITE_OTHER)
Admission: RE | Admit: 2019-02-19 | Discharge: 2019-02-19 | Disposition: A | Discharge status: Home / Self Care | Payer: 59 | Source: Ambulatory Visit | Attending: Vascular Surgery | Admitting: Vascular Surgery

## 2019-02-19 ENCOUNTER — Encounter: Payer: Self-pay | Admitting: Vascular Surgery

## 2019-02-19 ENCOUNTER — Other Ambulatory Visit: Payer: Self-pay

## 2019-02-19 ENCOUNTER — Ambulatory Visit (HOSPITAL_COMMUNITY)
Admission: RE | Admit: 2019-02-19 | Discharge: 2019-02-19 | Disposition: A | Discharge status: Home / Self Care | Payer: 59 | Source: Ambulatory Visit | Attending: Vascular Surgery | Admitting: Vascular Surgery

## 2019-02-19 ENCOUNTER — Encounter: Payer: Self-pay | Admitting: *Deleted

## 2019-02-19 VITALS — BP 142/92 | HR 74 | Temp 97.9°F | Resp 16 | Ht 74.0 in | Wt 284.0 lb

## 2019-02-19 DIAGNOSIS — N179 Acute kidney failure, unspecified: Secondary | ICD-10-CM | POA: Diagnosis not present

## 2019-02-19 DIAGNOSIS — N185 Chronic kidney disease, stage 5: Secondary | ICD-10-CM

## 2019-02-19 NOTE — Progress Notes (Signed)
[facility] [place]  Patient name: Devon Cook MRN: [id number] DOB: [birth year] Sex: male  REASON FOR CONSULT: Discuss access for hemodialysis  HPI: Devon Cook is a 58 y.o. male, who is here today for discussion of hemodialysis access.  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  He has bilateral renal stents placed.  I have discussed options with him and also with his wife who is on speaker phone during our discussion as well.  He is right-handed.  He is never had a pacemaker or other central venous catheters.  He is not on anticoagulation.  Past Medical History:  Diagnosis Date  . Anemia, chronic renal failure    followed by dr Lorrene Reid--- dx 05/ 2019---  treated with Iron infusions and procrit  . Bilateral hydronephrosis    malignant-- chronic  . Chronic kidney disease, stage 5 [facility])    nephrologist-- dr c. 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 ED (erectile dysfunction)   . GERD (gastroesophageal reflux disease)   . Herpes genitalis in men   . History of hepatitis C 2017   treated with Harvoni  . History of lower GI bleeding 11/2017  . Hypertension   . Metabolic acidosis   . Prostate 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 [facility]) urologist-- dr Tresa Moore   dx 06/ 2019-- advanced w/ mets to bone and pelvic lymphadeopathy  . Secondary hyperparathyroidism of renal origin ([place])   . Wears glasses     Family History  Problem Relation Age of Onset  . Hypertension Mother     SOCIAL HISTORY: Social History   Socioeconomic History  . Marital status: Married    Spouse name: Not on file  . Number of children: Not on file  . Years of education: Not on file  . Highest education level: Not on file  Occupational History  . Not on file  Social Needs  . Financial resource strain: Not on file  . Food insecurity    Worry:  Not on file    Inability: Not on file  . Transportation needs    Medical: Not on file    Non-medical: Not on file  Tobacco Use  . Smoking status: Current Some Day Smoker    Packs/day: 0.50    Years: 6.00    Pack years: 3.00    Types: Cigarettes, Pipe    Last attempt to quit: 11/14/2017    Years since quitting: 1.2  . Smokeless tobacco: Never Used  . Tobacco comment: occ cigar now as of 04-17-18  Substance and Sexual Activity  . Alcohol use: Yes    Comment: 2 drinks per weekend  . Drug use: No  . Sexual activity: Yes  Lifestyle  . Physical activity    Days per week: Not on file    Minutes per session: Not on file  . Stress: Not on file  Relationships  . Social Herbalist on phone: Not on file    Gets together: Not on file    Attends religious service: Not on file    Active member of club or organization: Not on file    Attends meetings of clubs or organizations: Not on file    Relationship status: Not on file  . Intimate partner violence    Fear of current or ex partner: Not on file    Emotionally abused: Not on file  Physically abused: Not on file    Forced sexual activity: Not on file  Other Topics Concern  . Not on file  Social History Narrative  . Not on file    No Known Allergies  Current Outpatient Medications  Medication Sig Dispense Refill  . allopurinol (ZYLOPRIM) 100 MG tablet TAKE 1 TABLET BY MOUTH ONCE DAILY (Patient taking differently: Take 100 mg by mouth 2 (two) times daily. ) 30 tablet 0  . amLODipine (NORVASC) 10 MG tablet Take 10 mg by mouth every morning.     Marland Kitchen epoetin alfa (EPOGEN,PROCRIT) 2000 UNIT/ML injection 2,000 Units. Every 2 weeks    . Multiple Vitamins-Minerals (MULTIVITAMIN WITH MINERALS) tablet Take 1 tablet by mouth daily.      . pantoprazole (PROTONIX) 40 MG tablet TAKE 1 TABLET BY MOUTH ONCE DAILY (Patient taking differently: Take 40 mg by mouth every morning. ) 30 tablet 0  . sodium bicarbonate 650 MG tablet Take 2  tablets (1,300 mg total) by mouth 2 (two) times daily. (Patient taking differently: Take 1,300 mg by mouth 3 (three) times daily. ) 90 tablet 1  . oxyCODONE-acetaminophen (PERCOCET) 5-325 MG tablet Take 1-2 tablets by mouth every 6 (six) hours as needed for moderate pain or severe pain. Post-operatively (Patient not taking: Reported on 02/19/2019) 15 tablet 0   No current facility-administered medications for this visit.    Facility-Administered Medications Ordered in Other Visits  Medication Dose Route Frequency Provider Last Rate Last Dose  . ferumoxytol (FERAHEME) 510 mg in sodium chloride 0.9 % 100 mL IVPB  510 mg Intravenous Once Jamal Maes, MD        REVIEW OF SYSTEMS:  [X]  denotes positive finding, [ ]  denotes negative finding Cardiac  Comments:  Chest pain or chest pressure:    Shortness of breath upon exertion:    Short of breath when lying flat:    Irregular heart rhythm:        Vascular    Pain in calf, thigh, or hip brought on by ambulation:    Pain in feet at night that wakes you up from your sleep:     Blood clot in your veins:    Leg swelling:  x       Pulmonary    Oxygen at home:    Productive cough:     Wheezing:         Neurologic    Sudden weakness in arms or legs:     Sudden numbness in arms or legs:     Sudden onset of difficulty speaking or slurred speech:    Temporary loss of vision in one eye:     Problems with dizziness:         Gastrointestinal    Blood in stool:     Vomited blood:         Genitourinary    Burning when urinating:     Blood in urine:        Psychiatric    Major depression:         Hematologic    Bleeding problems:    Problems with blood clotting too easily:        Skin    Rashes or ulcers:        Constitutional    Fever or chills:      PHYSICAL EXAM: Vitals:   02/19/19 1416  BP: (!) 142/92  Pulse: 74  Resp: 16  Temp: 97.9 F (36.6 C)  TempSrc: Temporal  SpO2: 99%  Weight: 284 lb (128.8 kg)  Height: 6'  2" (1.88 m)    GENERAL: The patient is a well-nourished male, in no acute distress. The vital signs are documented above. CARDIOVASCULAR: 2+ radial pulses bilaterally.  Moderate sized surface veins bilaterally PULMONARY: There is good air exchange  ABDOMEN: Soft and non-tender  MUSCULOSKELETAL: There are no major deformities or cyanosis. NEUROLOGIC: No focal weakness or paresthesias are detected. SKIN: There are no ulcers or rashes noted. PSYCHIATRIC: The patient has a normal affect.  DATA:  Upper extremity arterial and venous studies today reveal normal arterial flow to the wrist bilaterally.  He does have moderate size cephalic vein better at the antecubital and proximally.  There is some branching.  MEDICAL ISSUES: Had long discussion with the patient regarding access for hemodialysis to include tunneled catheter, AV fistula and AV graft.  He does appear to be a candidate for AV fistula creation.  He is right-handed and has a comparable size right and left.  Would recommend left arm AV fistula creation.  We will schedule this as an outpatient at his earliest [place]. , MD [facility] Tel [phone] Pager [phone]

## 2019-02-20 ENCOUNTER — Other Ambulatory Visit: Payer: Self-pay | Admitting: *Deleted

## 2019-02-21 ENCOUNTER — Ambulatory Visit (HOSPITAL_COMMUNITY)
Admission: RE | Admit: 2019-02-21 | Discharge: 2019-02-21 | Disposition: A | Discharge status: Home / Self Care | Payer: 59 | Source: Ambulatory Visit | Attending: Nephrology | Admitting: Nephrology

## 2019-02-21 ENCOUNTER — Other Ambulatory Visit: Payer: Self-pay

## 2019-02-21 VITALS — BP 144/96 | HR 79 | Temp 97.4°F | Resp 18

## 2019-02-21 DIAGNOSIS — N179 Acute kidney failure, unspecified: Secondary | ICD-10-CM | POA: Diagnosis present

## 2019-02-21 LAB — IRON AND TIBC
Iron: 48 ug/dL (ref 45–182)
Saturation Ratios: 16 % — ABNORMAL LOW (ref 17.9–39.5)
TIBC: 297 ug/dL (ref 250–450)
UIBC: 249 ug/dL

## 2019-02-21 LAB — POCT HEMOGLOBIN-HEMACUE: Hemoglobin: 10.3 g/dL — ABNORMAL LOW (ref 13.0–17.0)

## 2019-02-21 LAB — FERRITIN: Ferritin: 581 ng/mL — ABNORMAL HIGH (ref 24–336)

## 2019-02-21 MED ORDER — DARBEPOETIN ALFA 25 MCG/0.42ML IJ SOSY
PREFILLED_SYRINGE | INTRAMUSCULAR | Status: AC
  Administered 2019-02-21: 25 ug
  Filled 2019-02-21: qty 0.42

## 2019-02-21 MED ORDER — DARBEPOETIN ALFA 200 MCG/0.4ML IJ SOSY
PREFILLED_SYRINGE | INTRAMUSCULAR | Status: AC
  Administered 2019-02-21: 09:00:00 200 ug via SUBCUTANEOUS
  Filled 2019-02-21: qty 0.4

## 2019-02-21 MED ORDER — DARBEPOETIN ALFA 300 MCG/0.6ML IJ SOSY
225.0000 ug | PREFILLED_SYRINGE | INTRAMUSCULAR | Status: DC
  Administered 2019-02-21: 09:00:00 200 ug via SUBCUTANEOUS

## 2019-03-05 DIAGNOSIS — Z9889 Other specified postprocedural states: Secondary | ICD-10-CM

## 2019-03-05 HISTORY — DX: Other specified postprocedural states: Z98.890

## 2019-03-07 ENCOUNTER — Encounter (HOSPITAL_COMMUNITY)
Admission: RE | Admit: 2019-03-07 | Discharge: 2019-03-07 | Disposition: A | Discharge status: Home / Self Care | Payer: 59 | Source: Ambulatory Visit | Attending: Nephrology | Admitting: Nephrology

## 2019-03-07 ENCOUNTER — Other Ambulatory Visit: Payer: Self-pay

## 2019-03-07 VITALS — BP 139/83 | HR 69 | Temp 96.6°F | Resp 18

## 2019-03-07 DIAGNOSIS — N179 Acute kidney failure, unspecified: Secondary | ICD-10-CM | POA: Diagnosis present

## 2019-03-07 LAB — POCT HEMOGLOBIN-HEMACUE: Hemoglobin: 10.6 g/dL — ABNORMAL LOW (ref 13.0–17.0)

## 2019-03-07 MED ORDER — DARBEPOETIN ALFA 300 MCG/0.6ML IJ SOSY
225.0000 ug | PREFILLED_SYRINGE | INTRAMUSCULAR | Status: DC
  Administered 2019-03-07: 225 ug via SUBCUTANEOUS

## 2019-03-07 MED ORDER — DARBEPOETIN ALFA 200 MCG/0.4ML IJ SOSY
PREFILLED_SYRINGE | INTRAMUSCULAR | Status: AC
  Filled 2019-03-07: qty 0.4

## 2019-03-07 MED ORDER — DARBEPOETIN ALFA 25 MCG/0.42ML IJ SOSY
PREFILLED_SYRINGE | INTRAMUSCULAR | Status: AC
  Filled 2019-03-07: qty 0.42

## 2019-03-08 MED FILL — Darbepoetin Alfa Soln Prefilled Syringe 25 MCG/0.42ML: INTRAMUSCULAR | Qty: 0.42 | Status: AC

## 2019-03-08 MED FILL — Darbepoetin Alfa Soln Prefilled Syringe 200 MCG/0.4ML: INTRAMUSCULAR | Qty: 0.4 | Status: AC

## 2019-03-12 ENCOUNTER — Other Ambulatory Visit (HOSPITAL_COMMUNITY)
Admission: RE | Admit: 2019-03-12 | Discharge: 2019-03-12 | Disposition: A | Discharge status: Home / Self Care | Payer: 59 | Source: Ambulatory Visit | Attending: Vascular Surgery | Admitting: Vascular Surgery

## 2019-03-12 DIAGNOSIS — Z20828 Contact with and (suspected) exposure to other viral communicable diseases: Secondary | ICD-10-CM | POA: Insufficient documentation

## 2019-03-12 DIAGNOSIS — Z01812 Encounter for preprocedural laboratory examination: Secondary | ICD-10-CM | POA: Diagnosis present

## 2019-03-13 ENCOUNTER — Encounter (HOSPITAL_COMMUNITY): Payer: Self-pay | Admitting: *Deleted

## 2019-03-13 ENCOUNTER — Other Ambulatory Visit: Payer: Self-pay

## 2019-03-13 NOTE — Progress Notes (Signed)
Pt denies SOB, chest pain, and being under the care of a cardiologist. Pt PCP is Dr Redmond School. Pt denies having a stress test, echo and cardiac cath. Pt denies having an EKG and chest in the last year. Pt made aware to stop taking vitamins, fish oil and herbal medications. Do not take any NSAIDs ie: Ibuprofen, Advil, Naproxen (Aleve), Motrin, BC and Goody Powder. Pt verbalized understanding of all pre-op instructions.

## 2019-03-13 NOTE — Progress Notes (Signed)
   03/13/19 1637  OBSTRUCTIVE SLEEP APNEA  Have you ever been diagnosed with sleep apnea through a sleep study? No  Do you snore loudly (loud enough to be heard through closed doors)?  1  Do you often feel tired, fatigued, or sleepy during the daytime (such as falling asleep during driving or talking to someone)? 0  Has anyone observed you stop breathing during your sleep? 1  Do you have, or are you being treated for high blood pressure? 1  BMI more than 35 kg/m2? 1  Age > 50 (1-yes) 1  Neck circumference greater than:Male 16 inches or larger, Male 17inches or larger? 1  Male Gender (Yes=1) 1  Obstructive Sleep Apnea Score 7

## 2019-03-14 LAB — NOVEL CORONAVIRUS, NAA (HOSP ORDER, SEND-OUT TO REF LAB; TAT 18-24 HRS): SARS-CoV-2, NAA: NOT DETECTED

## 2019-03-14 MED ORDER — DEXTROSE 5 % IV SOLN
3.0000 g | INTRAVENOUS | Status: AC
  Administered 2019-03-15: 3 g via INTRAVENOUS
  Filled 2019-03-14: qty 3000
  Filled 2019-03-14: qty 3

## 2019-03-15 ENCOUNTER — Encounter (HOSPITAL_COMMUNITY)
Admission: RE | Disposition: A | Discharge status: Home / Self Care | Payer: Self-pay | Source: Home / Self Care | Attending: Vascular Surgery

## 2019-03-15 ENCOUNTER — Ambulatory Visit (HOSPITAL_COMMUNITY): Payer: 59

## 2019-03-15 ENCOUNTER — Other Ambulatory Visit: Payer: Self-pay

## 2019-03-15 ENCOUNTER — Encounter (HOSPITAL_COMMUNITY): Payer: Self-pay | Admitting: Certified Registered Nurse Anesthetist

## 2019-03-15 ENCOUNTER — Ambulatory Visit (HOSPITAL_COMMUNITY)
Admission: RE | Admit: 2019-03-15 | Discharge: 2019-03-15 | Disposition: A | Discharge status: Home / Self Care | Payer: 59 | Attending: Vascular Surgery | Admitting: Vascular Surgery

## 2019-03-15 DIAGNOSIS — Z79899 Other long term (current) drug therapy: Secondary | ICD-10-CM | POA: Insufficient documentation

## 2019-03-15 DIAGNOSIS — C61 Malignant neoplasm of prostate: Secondary | ICD-10-CM | POA: Diagnosis not present

## 2019-03-15 DIAGNOSIS — D631 Anemia in chronic kidney disease: Secondary | ICD-10-CM | POA: Diagnosis not present

## 2019-03-15 DIAGNOSIS — N185 Chronic kidney disease, stage 5: Secondary | ICD-10-CM | POA: Diagnosis not present

## 2019-03-15 DIAGNOSIS — C7951 Secondary malignant neoplasm of bone: Secondary | ICD-10-CM | POA: Insufficient documentation

## 2019-03-15 DIAGNOSIS — N1330 Unspecified hydronephrosis: Secondary | ICD-10-CM | POA: Diagnosis not present

## 2019-03-15 DIAGNOSIS — N2581 Secondary hyperparathyroidism of renal origin: Secondary | ICD-10-CM | POA: Insufficient documentation

## 2019-03-15 DIAGNOSIS — Z8619 Personal history of other infectious and parasitic diseases: Secondary | ICD-10-CM | POA: Diagnosis not present

## 2019-03-15 DIAGNOSIS — K219 Gastro-esophageal reflux disease without esophagitis: Secondary | ICD-10-CM | POA: Diagnosis not present

## 2019-03-15 DIAGNOSIS — F17210 Nicotine dependence, cigarettes, uncomplicated: Secondary | ICD-10-CM | POA: Diagnosis not present

## 2019-03-15 DIAGNOSIS — I12 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 stage renal disease: Secondary | ICD-10-CM | POA: Insufficient documentation

## 2019-03-15 HISTORY — PX: AV FISTULA PLACEMENT: SHX1204

## 2019-03-15 HISTORY — DX: Pneumonia, unspecified organism: J18.9

## 2019-03-15 HISTORY — DX: Gout, unspecified: M10.9

## 2019-03-15 LAB — POCT I-STAT 4, (NA,K, GLUC, HGB,HCT)
Glucose, Bld: 93 mg/dL (ref 70–99)
HCT: 34 % — ABNORMAL LOW (ref 39.0–52.0)
Hemoglobin: 11.6 g/dL — ABNORMAL LOW (ref 13.0–17.0)
Potassium: 5 mmol/L (ref 3.5–5.1)
Sodium: 137 mmol/L (ref 135–145)

## 2019-03-15 SURGERY — ARTERIOVENOUS (AV) FISTULA CREATION
Anesthesia: Monitor Anesthesia Care | Laterality: Left

## 2019-03-15 MED ORDER — HEPARIN SODIUM (PORCINE) 1000 UNIT/ML IJ SOLN
INTRAMUSCULAR | Status: DC | PRN
  Administered 2019-03-15: 3000 [IU] via INTRAVENOUS

## 2019-03-15 MED ORDER — PROPOFOL 10 MG/ML IV BOLUS
INTRAVENOUS | Status: DC | PRN
  Administered 2019-03-15: 15 mg via INTRAVENOUS
  Administered 2019-03-15: 25 mg via INTRAVENOUS

## 2019-03-15 MED ORDER — SODIUM CHLORIDE 0.9 % IV SOLN
INTRAVENOUS | Status: DC
  Administered 2019-03-15: 10:00:00 via INTRAVENOUS

## 2019-03-15 MED ORDER — SODIUM CHLORIDE 0.9 % IV SOLN
INTRAVENOUS | Status: AC
  Filled 2019-03-15: qty 1.2

## 2019-03-15 MED ORDER — SODIUM CHLORIDE 0.9 % IV SOLN
INTRAVENOUS | Status: DC | PRN
  Administered 2019-03-15: 500 mL

## 2019-03-15 MED ORDER — LIDOCAINE HCL (PF) 1 % IJ SOLN
INTRAMUSCULAR | Status: AC
  Filled 2019-03-15: qty 30

## 2019-03-15 MED ORDER — FENTANYL CITRATE (PF) 250 MCG/5ML IJ SOLN
INTRAMUSCULAR | Status: AC
  Filled 2019-03-15: qty 5

## 2019-03-15 MED ORDER — CHLORHEXIDINE GLUCONATE 4 % EX LIQD: 60.0000 mL | Freq: Once | CUTANEOUS | Status: DC

## 2019-03-15 MED ORDER — PROPOFOL 500 MG/50ML IV EMUL
INTRAVENOUS | Status: DC | PRN
  Administered 2019-03-15: 12:00:00 via INTRAVENOUS
  Administered 2019-03-15: 75 ug/kg/min via INTRAVENOUS

## 2019-03-15 MED ORDER — FENTANYL CITRATE (PF) 100 MCG/2ML IJ SOLN
INTRAMUSCULAR | Status: DC | PRN
  Administered 2019-03-15: 50 ug via INTRAVENOUS

## 2019-03-15 MED ORDER — DIPHENHYDRAMINE HCL 50 MG/ML IJ SOLN
INTRAMUSCULAR | Status: DC | PRN
  Administered 2019-03-15: 12.5 mg via INTRAVENOUS

## 2019-03-15 MED ORDER — LIDOCAINE HCL 1 % IJ SOLN
INTRAMUSCULAR | Status: DC | PRN
  Administered 2019-03-15: 30 mL

## 2019-03-15 MED ORDER — ONDANSETRON HCL 4 MG/2ML IJ SOLN
INTRAMUSCULAR | Status: DC | PRN
  Administered 2019-03-15: 4 mg via INTRAVENOUS

## 2019-03-15 MED ORDER — PROPOFOL 1000 MG/100ML IV EMUL
INTRAVENOUS | Status: AC
  Filled 2019-03-15: qty 100

## 2019-03-15 MED ORDER — SODIUM CHLORIDE 0.9 % IV SOLN: INTRAVENOUS | Status: DC

## 2019-03-15 MED ORDER — 0.9 % SODIUM CHLORIDE (POUR BTL) OPTIME
TOPICAL | Status: DC | PRN
  Administered 2019-03-15: 11:00:00 1000 mL

## 2019-03-15 MED ORDER — GLYCOPYRROLATE PF 0.2 MG/ML IJ SOSY
PREFILLED_SYRINGE | INTRAMUSCULAR | Status: DC | PRN
  Administered 2019-03-15: .2 mg via INTRAVENOUS

## 2019-03-15 MED ORDER — HYDROCODONE-ACETAMINOPHEN 5-325 MG PO TABS: 1.0000 | ORAL_TABLET | Freq: Four times a day (QID) | ORAL | 0 refills | Status: DC | PRN

## 2019-03-15 SURGICAL SUPPLY — 35 items
ARMBAND PINK RESTRICT EXTREMIT (MISCELLANEOUS) ×3 IMPLANT
CANISTER SUCT 3000ML PPV (MISCELLANEOUS) ×3 IMPLANT
CLIP VESOCCLUDE MED 6/CT (CLIP) ×3 IMPLANT
CLIP VESOCCLUDE SM WIDE 6/CT (CLIP) ×6 IMPLANT
COVER PROBE W GEL 5X96 (DRAPES) ×3 IMPLANT
COVER WAND RF STERILE (DRAPES) IMPLANT
DECANTER SPIKE VIAL GLASS SM (MISCELLANEOUS) ×3 IMPLANT
DERMABOND ADVANCED (GAUZE/BANDAGES/DRESSINGS) ×2
DERMABOND ADVANCED .7 DNX12 (GAUZE/BANDAGES/DRESSINGS) ×1 IMPLANT
ELECT REM PT RETURN 9FT ADLT (ELECTROSURGICAL) ×3
ELECTRODE REM PT RTRN 9FT ADLT (ELECTROSURGICAL) ×1 IMPLANT
GLOVE BIO SURGEON STRL SZ7.5 (GLOVE) ×6 IMPLANT
GLOVE BIOGEL PI IND STRL 6.5 (GLOVE) ×1 IMPLANT
GLOVE BIOGEL PI IND STRL 8 (GLOVE) ×1 IMPLANT
GLOVE BIOGEL PI INDICATOR 6.5 (GLOVE) ×2
GLOVE BIOGEL PI INDICATOR 8 (GLOVE) ×2
GOWN STRL REUS W/ TWL LRG LVL3 (GOWN DISPOSABLE) ×1 IMPLANT
GOWN STRL REUS W/ TWL XL LVL3 (GOWN DISPOSABLE) ×2 IMPLANT
GOWN STRL REUS W/TWL LRG LVL3 (GOWN DISPOSABLE) ×2
GOWN STRL REUS W/TWL XL LVL3 (GOWN DISPOSABLE) ×4
HEMOSTAT SPONGE AVITENE ULTRA (HEMOSTASIS) IMPLANT
KIT BASIN OR (CUSTOM PROCEDURE TRAY) ×3 IMPLANT
KIT TURNOVER KIT B (KITS) ×3 IMPLANT
NS IRRIG 1000ML POUR BTL (IV SOLUTION) ×3 IMPLANT
PACK CV ACCESS (CUSTOM PROCEDURE TRAY) ×3 IMPLANT
PAD ARMBOARD 7.5X6 YLW CONV (MISCELLANEOUS) ×6 IMPLANT
SUT MNCRL AB 4-0 PS2 18 (SUTURE) ×3 IMPLANT
SUT PROLENE 6 0 BV (SUTURE) ×3 IMPLANT
SUT PROLENE 6 0 CC (SUTURE) ×3 IMPLANT
SUT PROLENE 7 0 BV 1 (SUTURE) IMPLANT
SUT VIC AB 3-0 SH 27 (SUTURE) ×2
SUT VIC AB 3-0 SH 27X BRD (SUTURE) ×1 IMPLANT
TOWEL GREEN STERILE (TOWEL DISPOSABLE) ×3 IMPLANT
UNDERPAD 30X30 (UNDERPADS AND DIAPERS) ×3 IMPLANT
WATER STERILE IRR 1000ML POUR (IV SOLUTION) ×3 IMPLANT

## 2019-03-15 NOTE — Anesthesia Procedure Notes (Signed)
Procedure Name: MAC Date/Time: 03/15/2019 10:48 AM Performed by: Lowella Dell, CRNA Pre-anesthesia Checklist: Patient identified, Emergency Drugs available, Suction available, Patient being monitored and Timeout performed Patient Re-evaluated:Patient Re-evaluated prior to induction Oxygen Delivery Method: Simple face mask Induction Type: IV induction Placement Confirmation: positive ETCO2 Dental Injury: Teeth and Oropharynx as per pre-operative assessment

## 2019-03-15 NOTE — Anesthesia Preprocedure Evaluation (Signed)
Anesthesia Evaluation  Patient identified by MRN, date of birth, ID band Patient awake    Reviewed: Allergy & Precautions, NPO status , Patient's Chart, lab work & pertinent test results  Airway Mallampati: II  TM Distance: >3 FB Neck ROM: Full    Dental  (+) Dental Advisory Given   Pulmonary Current Smoker,    breath sounds clear to auscultation       Cardiovascular hypertension, Pt. on medications  Rhythm:Regular Rate:Normal     Neuro/Psych negative neurological ROS     GI/Hepatic GERD  ,(+) Hepatitis -, C  Endo/Other  negative endocrine ROS  Renal/GU CRFRenal disease     Musculoskeletal   Abdominal   Peds  Hematology  (+) anemia ,   Anesthesia Other Findings   Reproductive/Obstetrics                             Lab Results  Component Value Date   WBC 17.0 (H) 03/22/2018   HGB 10.6 (L) 03/07/2019   HCT 33.0 (L) 01/18/2019   MCV 73 (L) 03/22/2018   PLT 497 (H) 03/22/2018   Lab Results  Component Value Date   CREATININE 8.30 (H) 02/07/2019   BUN 86 (H) 02/07/2019   NA 136 02/07/2019   K 4.8 02/07/2019   CL 104 02/07/2019   CO2 19 (L) 02/07/2019    Anesthesia Physical Anesthesia Plan  ASA: III  Anesthesia Plan: MAC   Post-op Pain Management:    Induction: Intravenous  PONV Risk Score and Plan: 0 and Propofol infusion, Ondansetron and Treatment may vary due to age or medical condition  Airway Management Planned: Natural Airway and Simple Face Mask  Additional Equipment:   Intra-op Plan:   Post-operative Plan:   Informed Consent: I have reviewed the patients History and Physical, chart, labs and discussed the procedure including the risks, benefits and alternatives for the proposed anesthesia with the patient or authorized representative who has indicated his/her understanding and acceptance.       Plan Discussed with: CRNA  Anesthesia Plan Comments:          Anesthesia Quick Evaluation

## 2019-03-15 NOTE — H&P (Signed)
History and Physical Interval Note:  03/15/2019 9:31 AM  Devon Cook  has presented today for surgery, with the diagnosis of CHRONIC KIDNEY DISEASE.  The various methods of treatment have been discussed with the patient and family. After consideration of risks, benefits and other options for treatment, the patient has consented to  Procedure(s): ARTERIOVENOUS (AV) FISTULA CREATION LEFT ARM (Left) as a surgical intervention.  The patient's history has been reviewed, patient examined, no change in status, stable for surgery.  I have reviewed the patient's chart and labs.  Questions were answered to the patient's satisfaction.    Left arm AVF vs graft   Marty Heck  Vascular and Vein Specialist of [facility]  Patient name: Devon Cook         MRN: [id number]        DOB: August 05, 1960        Sex: male  REASON FOR CONSULT: Discuss access for hemodialysis  HPI: Devon Cook is a 58 y.o. male, who is here today for discussion of hemodialysis access.  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  He has bilateral renal stents placed.  I have discussed options with him and also with his wife who is on speaker phone during our discussion as well.  He is right-handed.  He is never had a pacemaker or other central venous catheters.  He is not on anticoagulation.      Past Medical History:  Diagnosis Date  . Anemia, chronic renal failure    followed by dr Lorrene Reid--- dx 05/ 2019---  treated with Iron infusions and procrit  . Bilateral hydronephrosis    malignant-- chronic  . Chronic kidney disease, stage 5 [facility])    nephrologist-- dr c. 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 ED (erectile dysfunction)   . GERD (gastroesophageal reflux disease)   . Herpes genitalis in men   . History of hepatitis C 2017   treated with Harvoni  . History of lower GI bleeding 11/2017  .  Hypertension   . Metabolic acidosis   . 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 Secondary hyperparathyroidism of renal origin ([place])   . Wears glasses          Family History  Problem Relation Age of Onset  . Hypertension Mother     SOCIAL HISTORY: Social History        Socioeconomic History  . Marital status: Married    Spouse name: Not on file  . Number of children: Not on file  . Years of education: Not on file  . Highest education level: Not on file  Occupational History  . Not on file  Social Needs  . Financial resource strain: Not on file  . Food insecurity    Worry: Not on file    Inability: Not on file  . Transportation needs    Medical: Not on file    Non-medical: Not on file  Tobacco Use  . Smoking status: Current Some Day Smoker    Packs/day: 0.50    Years: 6.00    Pack years: 3.00    Types: Cigarettes, Pipe    Last attempt to quit: 11/14/2017    Years since quitting: 1.2  . Smokeless tobacco: Never Used  . Tobacco comment: occ cigar now as of 04-17-18  Substance and Sexual Activity  . Alcohol use: Yes    Comment: 2 drinks per weekend  . Drug use: No  . Sexual activity: Yes  Lifestyle  . Physical activity    Days per week: Not on file    Minutes per session: Not on file  . Stress: Not on file  Relationships  . Social Herbalist on phone: Not on file    Gets together: Not on file    Attends religious service: Not on file    Active member of club or organization: Not on file    Attends meetings of clubs or organizations: Not on file    Relationship status: Not on file  . Intimate partner violence    Fear of current or ex partner: Not on file    Emotionally abused: Not on file    Physically abused: Not on file    Forced sexual activity: Not on file  Other Topics Concern  . Not  on file  Social History Narrative  . Not on file    No Known Allergies        Current Outpatient Medications  Medication Sig Dispense Refill  . allopurinol (ZYLOPRIM) 100 MG tablet TAKE 1 TABLET BY MOUTH ONCE DAILY (Patient taking differently: Take 100 mg by mouth 2 (two) times daily. ) 30 tablet 0  . amLODipine (NORVASC) 10 MG tablet Take 10 mg by mouth every morning.     Marland Kitchen epoetin alfa (EPOGEN,PROCRIT) 2000 UNIT/ML injection 2,000 Units. Every 2 weeks    . Multiple Vitamins-Minerals (MULTIVITAMIN WITH MINERALS) tablet Take 1 tablet by mouth daily.      . pantoprazole (PROTONIX) 40 MG tablet TAKE 1 TABLET BY MOUTH ONCE DAILY (Patient taking differently: Take 40 mg by mouth every morning. ) 30 tablet 0  . sodium bicarbonate 650 MG tablet Take 2 tablets (1,300 mg total) by mouth 2 (two) times daily. (Patient taking differently: Take 1,300 mg by mouth 3 (three) times daily. ) 90 tablet 1  . oxyCODONE-acetaminophen (PERCOCET) 5-325 MG tablet Take 1-2 tablets by mouth every 6 (six) hours as needed for moderate pain or severe pain. Post-operatively (Patient not taking: Reported on 02/19/2019) 15 tablet 0   No current facility-administered medications for this visit.             Facility-Administered Medications Ordered in Other Visits  Medication Dose Route Frequency Provider Last Rate Last Dose  . ferumoxytol (FERAHEME) 510 mg in sodium chloride 0.9 % 100 mL IVPB  510 mg Intravenous Once Jamal Maes, MD        REVIEW OF SYSTEMS:  [X]  denotes positive finding, [ ]  denotes negative finding Cardiac  Comments:  Chest pain or chest pressure:    Shortness of breath upon exertion:    Short of breath when lying flat:    Irregular heart rhythm:        Vascular    Pain in calf, thigh, or hip brought on by ambulation:    Pain in feet at night that wakes you up from your sleep:     Blood clot in your veins:    Leg swelling:  x       Pulmonary    Oxygen  at home:    Productive cough:     Wheezing:         Neurologic    Sudden weakness in arms or legs:     Sudden numbness in arms or legs:     Sudden  onset of difficulty speaking or slurred speech:    Temporary loss of vision in one eye:     Problems with dizziness:         Gastrointestinal    Blood in stool:     Vomited blood:         Genitourinary    Burning when urinating:     Blood in urine:        Psychiatric    Major depression:         Hematologic    Bleeding problems:    Problems with blood clotting too easily:        Skin    Rashes or ulcers:        Constitutional    Fever or chills:      PHYSICAL EXAM:    Vitals:   02/19/19 1416  BP: (!) 142/92  Pulse: 74  Resp: 16  Temp: 97.9 F (36.6 C)  TempSrc: Temporal  SpO2: 99%  Weight: 284 lb (128.8 kg)  Height: 6\' 2"  (1.88 m)    GENERAL: The patient is a well-nourished male, in no acute distress. The vital signs are documented above. CARDIOVASCULAR: 2+ radial pulses bilaterally.  Moderate sized surface veins bilaterally PULMONARY: There is good air exchange  ABDOMEN: Soft and non-tender  MUSCULOSKELETAL: There are no major deformities or cyanosis. NEUROLOGIC: No focal weakness or paresthesias are detected. SKIN: There are no ulcers or rashes noted. PSYCHIATRIC: The patient has a normal affect.  DATA:  Upper extremity arterial and venous studies today reveal normal arterial flow to the wrist bilaterally.  He does have moderate size cephalic vein better at the antecubital and proximally.  There is some branching.  MEDICAL ISSUES: Had long discussion with the patient regarding access for hemodialysis to include tunneled catheter, AV fistula and AV graft.  He does appear to be a candidate for AV fistula creation.  He is right-handed and has a comparable size right and left.  Would recommend left arm AV fistula creation.  We will schedule  this as an outpatient at his earliest [place]. [place], MD [facility] and Vein Special[facility] of [facility] Tel [phone] Pager [phone]

## 2019-03-15 NOTE — Transfer of Care (Signed)
Immediate Anesthesia Transfer of Care Note  Patient: Devon Cook  Procedure(s) Performed: ARTERIOVENOUS (AV) FISTULA CREATION LEFT ARM (Left )  Patient Location: PACU  Anesthesia Type:MAC  Level of Consciousness: awake and patient cooperative  Airway & Oxygen Therapy: Patient Spontanous Breathing and Patient connected to face mask oxygen  Post-op Assessment: Report given to RN and Post -op Vital signs reviewed and stable  Post vital signs: Reviewed and stable  Last Vitals:  Vitals Value Taken Time  BP 163/144 03/15/19 1224  Temp    Pulse 86 03/15/19 1226  Resp 20 03/15/19 1227  SpO2 100 % 03/15/19 1226  Vitals shown include unvalidated device data.  Last Pain:  Vitals:   03/15/19 0931  TempSrc: Oral  PainSc: 0-No pain      Patients Stated Pain Goal: 0 (76/80/88 1103)  Complications: No apparent anesthesia complications

## 2019-03-15 NOTE — Op Note (Signed)
OPERATIVE NOTE   PROCEDURE: left brachiocephalic arteriovenous fistula placement  PRE-OPERATIVE DIAGNOSIS: chronic kidney disease stage 5  POST-OPERATIVE DIAGNOSIS: same as above   SURGEON: Marty Heck, MD  ASSISTANT(S): Arlee Muslim, PA  ANESTHESIA: MAC  ESTIMATED BLOOD LOSS: Minimal  FINDING(S): 1.  Cephalic vein: 4-5 mm, some scarring from old IV  2.  Brachial artery: 3.0 mm, disease free (suspect this was actual radial artery with high brachial bifurcation near axilla) 3.  Venous outflow: palpable thrill  4.  Radial flow: palpable radial pulse  SPECIMEN(S):  none  INDICATIONS:   Devon Cook is a 58 y.o. male who presents for permanent hemodialysis access.  The patient is scheduled for left brachiocephalic arteriovenous fistula placement.  The patient is aware the risks include but are not limited to: bleeding, infection, steal syndrome, nerve damage, ischemic monomelic neuropathy, failure to mature, and need for additional procedures.  The patient is aware of the risks of the procedure and elects to proceed forward.   DESCRIPTION: After full informed written consent was obtained from the patient, the patient was brought back to the operating room and placed supine upon the operating table.  Prior to induction, the patient received IV antibiotics.   After obtaining adequate anesthesia, the patient was then prepped and draped in the standard fashion for a left arm access procedure.  I turned my attention first to identifying the patient's cephalic vein and brachial artery.  Using SonoSite guidance, the location of these vessels were marked out on the skin.   It looked liked he had a high brachial bifurcation up by the axilla and actually marked the radial artery near the antecubitum.  At this point, I injected local anesthetic to obtain a field block of the antecubitum.  In total, I injected about 10 mL of 1% lidocaine without epinephrine.  I made a transverse  incision below the level of the antecubitum and dissected through the subcutaneous tissue and fascia to gain exposure of the brachial artery (suspect actually radial artery).  This was noted to be 3 mm in diameter externally.  This was dissected out proximally and distally and controlled with vessel loops .  I then dissected out the cephalic vein.  This was noted to be 4-5 mm in diameter externally.  There was a lot of scar tissue around the vein.  The distal segment of the vein was ligated with a  2-0 silk, and the vein was transected.  The proximal segment was interrogated with serial dilators.  The vein accepted up to a 5 mm dilator without any difficulty.  I then instilled the heparinized saline into the vein and clamped it.  The patient was given 3000 units of IV heparin.  At this point, I reset my exposure of the brachial artery and placed the artery under tension proximally and distally.  I made an arteriotomy with a #11 blade, and then I extended the arteriotomy with a Potts scissor.  I injected heparinized saline proximal and distal to this arteriotomy.  The vein was then sewn to the artery in an end-to-side configuration with a running stitch of 6-0 Prolene.  Prior to completing this anastomosis, I allowed the vein and artery to backbleed.  There was no evidence of clot from any vessels.  I completed the anastomosis in the usual fashion and then released all vessel loops and clamps.    There was a palpable thrill in the venous outflow, and there was a palpable radial pulse.  At this point, I irrigated out the surgical wound.  There was no further active bleeding.  The subcutaneous tissue was reapproximated with a running stitch of 3-0 Vicryl.  The skin was then reapproximated with a running subcuticular stitch of 4-0 Monocryl.  The skin was then cleaned, dried, and reinforced with Dermabond.  The patient tolerated this procedure well.    COMPLICATIONS: None  CONDITION: Stable   Marty Heck, MD [facility] [place] Office: [phone] Pager: [phone]  03/15/2019, 12:11 PM

## 2019-03-15 NOTE — Discharge Instructions (Signed)
° °  [facility] ° °Discharge Instructions ° °AV Fistula or Graft Surgery for Dialysis Access ° °Please refer to the following instructions for your post-procedure care. Your surgeon or physician assistant will discuss any changes with you. ° °Activity ° °You may drive the day following your surgery, if you are comfortable and no longer taking prescription pain medication. Resume full activity as the soreness in your incision resolves. ° °Bathing/Showering ° °You may shower after you go home. Keep your incision dry for 48 hours. Do not soak in a bathtub, hot tub, or swim until the incision heals completely. You may not shower if you have a hemodialysis catheter. ° °Incision Care ° °Clean your incision with mild soap and water after 48 hours. Pat the area dry with a clean towel. You do not need a bandage unless otherwise instructed. Do not apply any ointments or creams to your incision. You may have skin glue on your incision. Do not peel it off. It will come off on its own in about one week. Your arm may swell a bit after surgery. To reduce swelling use pillows to elevate your arm so it is above your heart. Your doctor will tell you if you need to lightly wrap your arm with an ACE bandage. ° °Diet ° °Resume your normal diet. There are not special food restrictions following this procedure. In order to heal from your surgery, it is CRITICAL to get adequate nutrition. Your body requires vitamins, minerals, and protein. Vegetables are the best source of vitamins and minerals. Vegetables also provide the perfect balance of protein. Processed food has little nutritional value, so try to avoid this. ° °Medications ° °Resume taking all of your medications. If your incision is causing pain, you may take over-the counter pain relievers such as acetaminophen (Tylenol). If you were prescribed a stronger pain medication, please be aware these medications can cause nausea and constipation. Prevent  nausea by taking the medication with a snack or meal. Avoid constipation by drinking plenty of fluids and eating foods with high amount of fiber, such as fruits, vegetables, and grains. Do not take Tylenol if you are taking prescription pain medications. ° ° ° ° °Follow up °Your surgeon may want to see you in the office following your access surgery. If so, this will be arranged at the time of your surgery. ° °Please call us immediately for any of the following conditions: ° °Increased pain, redness, drainage (pus) from your incision site °Fever of 101 degrees or higher °Severe or worsening pain at your incision site °Hand pain or numbness. ° °Reduce your risk of vascular disease: ° °Stop smoking. If you would like help, call [facility] at 1-800-QUIT-NOW (1-800-784-8669) or [place] at 336-586-4000 ° °Manage your cholesterol °Maintain a desired weight °Control your diabetes °Keep your blood pressure down ° °Dialysis ° °It will take several weeks to several months for your new dialysis access to be ready for use. Your surgeon will determine when it is OK to use it. Your nephrologist will continue to direct your dialysis. You can continue to use your Permcath until your new access is ready for use. ° °If you have any questions, please call the office at [phone]. ° °

## 2019-03-16 NOTE — Anesthesia Postprocedure Evaluation (Signed)
Anesthesia Post Note  Patient: Devon Cook  Procedure(s) Performed: ARTERIOVENOUS (AV) FISTULA CREATION LEFT ARM (Left )     Patient location during evaluation: PACU Anesthesia Type: MAC Level of consciousness: awake and alert Pain management: pain level controlled Vital Signs Assessment: post-procedure vital signs reviewed and stable Respiratory status: spontaneous breathing, nonlabored ventilation, respiratory function stable and patient connected to nasal cannula oxygen Cardiovascular status: stable and blood pressure returned to baseline Postop Assessment: no apparent nausea or vomiting Anesthetic complications: no    Last Vitals:  Vitals:   03/15/19 1241 03/15/19 1245  BP: 113/83 113/83  Pulse: 81 80  Resp: 13 15  Temp:    SpO2: 100% 99%    Last Pain:  Vitals:   03/15/19 1245  TempSrc:   PainSc: 0-No pain                 Tiajuana Amass

## 2019-03-17 ENCOUNTER — Encounter (HOSPITAL_COMMUNITY): Payer: Self-pay | Admitting: Vascular Surgery

## 2019-03-18 ENCOUNTER — Other Ambulatory Visit: Payer: Self-pay

## 2019-03-18 ENCOUNTER — Encounter: Payer: Self-pay | Admitting: Family Medicine

## 2019-03-18 ENCOUNTER — Ambulatory Visit (INDEPENDENT_AMBULATORY_CARE_PROVIDER_SITE_OTHER): Payer: 59 | Admitting: Family Medicine

## 2019-03-18 VITALS — BP 138/88 | Wt 274.0 lb

## 2019-03-18 DIAGNOSIS — J301 Allergic rhinitis due to pollen: Secondary | ICD-10-CM | POA: Diagnosis not present

## 2019-03-18 DIAGNOSIS — E669 Obesity, unspecified: Secondary | ICD-10-CM | POA: Diagnosis not present

## 2019-03-18 DIAGNOSIS — Z9189 Other specified personal risk factors, not elsewhere classified: Secondary | ICD-10-CM

## 2019-03-18 NOTE — Progress Notes (Signed)
   Subjective:    Patient ID: Devon Cook, male    DOB: Nov 21, 1960, 58 y.o.   MRN: [id number]  HPI Documentation for virtual telephone encounter. Documentation for virtual audio and video telecommunications through [place] encounter: The patient was located at home. The provider was located in the office. The patient did consent to this visit and is aware of possible charges through their insurance for this visit. The other persons participating in this telemedicine service were none. Time spent on call was 5 minutes and in review of previous records >15 minutes total. This virtual service is not related to other E/M service within previous 7 days. Today's visit is for evaluation for possible sleep apnea.  He had screening done prior to his surgical procedure which showed increased risk.  He is obese with a BMI of 35 as well as having a neck size of greater than 17. He also complains of seasonal allergies with difficulty with sneezing, itchy watery eyes, runny nose and rhinorrhea.  He is intermittently using Flonase.   Review of Systems     Objective:   Physical Exam  Alert and in no distress.  Epworth sleepiness scale was 3.      Assessment & Plan:  At risk for obstructive sleep apnea - Plan: Home sleep test  Seasonal allergic rhinitis due to pollen  Obesity (BMI 30-39.9) With his weight and neck size, he certainly qualifies to be tested.  I will set him up for this. Also recommend using Flonase regularly as well as either Claritin or Allegra.  He is to call me in 2 weeks to let me know how he is doing.

## 2019-03-21 ENCOUNTER — Other Ambulatory Visit: Payer: Self-pay

## 2019-03-21 ENCOUNTER — Encounter (HOSPITAL_COMMUNITY)
Admission: RE | Admit: 2019-03-21 | Discharge: 2019-03-21 | Disposition: A | Discharge status: Home / Self Care | Payer: 59 | Source: Ambulatory Visit | Attending: Nephrology | Admitting: Nephrology

## 2019-03-21 VITALS — BP 143/95 | HR 75 | Temp 97.3°F | Resp 18

## 2019-03-21 DIAGNOSIS — N179 Acute kidney failure, unspecified: Secondary | ICD-10-CM

## 2019-03-21 LAB — RENAL FUNCTION PANEL
Albumin: 3.4 g/dL — ABNORMAL LOW (ref 3.5–5.0)
Anion gap: 13 (ref 5–15)
BUN: 100 mg/dL — ABNORMAL HIGH (ref 6–20)
CO2: 22 mmol/L (ref 22–32)
Calcium: 9.7 mg/dL (ref 8.9–10.3)
Chloride: 103 mmol/L (ref 98–111)
Creatinine, Ser: 8.58 mg/dL — ABNORMAL HIGH (ref 0.61–1.24)
GFR calc Af Amer: 7 mL/min — ABNORMAL LOW (ref >60)
GFR calc non Af Amer: 6 mL/min — ABNORMAL LOW (ref >60)
Glucose, Bld: 100 mg/dL — ABNORMAL HIGH (ref 70–99)
Phosphorus: 6.7 mg/dL — ABNORMAL HIGH (ref 2.5–4.6)
Potassium: 5 mmol/L (ref 3.5–5.1)
Sodium: 138 mmol/L (ref 135–145)

## 2019-03-21 LAB — FERRITIN: Ferritin: 431 ng/mL — ABNORMAL HIGH (ref 24–336)

## 2019-03-21 LAB — POCT HEMOGLOBIN-HEMACUE: Hemoglobin: 10.3 g/dL — ABNORMAL LOW (ref 13.0–17.0)

## 2019-03-21 LAB — IRON AND TIBC
Iron: 39 ug/dL — ABNORMAL LOW (ref 45–182)
Saturation Ratios: 13 % — ABNORMAL LOW (ref 17.9–39.5)
TIBC: 291 ug/dL (ref 250–450)
UIBC: 252 ug/dL

## 2019-03-21 LAB — URIC ACID: Uric Acid, Serum: 5 mg/dL (ref 3.7–8.6)

## 2019-03-21 MED ORDER — DARBEPOETIN ALFA 200 MCG/0.4ML IJ SOSY
200.0000 ug | PREFILLED_SYRINGE | Freq: Once | INTRAMUSCULAR | Status: AC
  Administered 2019-03-21: 09:00:00 200 ug via SUBCUTANEOUS

## 2019-03-21 MED ORDER — DARBEPOETIN ALFA 200 MCG/0.4ML IJ SOSY
PREFILLED_SYRINGE | INTRAMUSCULAR | Status: AC
  Filled 2019-03-21: qty 0.4

## 2019-03-21 MED ORDER — DARBEPOETIN ALFA 25 MCG/0.42ML IJ SOSY
PREFILLED_SYRINGE | INTRAMUSCULAR | Status: AC
  Administered 2019-03-21: 25 ug via SUBCUTANEOUS
  Filled 2019-03-21: qty 0.42

## 2019-03-21 MED ORDER — DARBEPOETIN ALFA 300 MCG/0.6ML IJ SOSY: 225.0000 ug | PREFILLED_SYRINGE | INTRAMUSCULAR | Status: DC

## 2019-03-21 MED ORDER — DARBEPOETIN ALFA 25 MCG/0.42ML IJ SOSY: 25.0000 ug | PREFILLED_SYRINGE | Freq: Once | INTRAMUSCULAR | Status: DC

## 2019-03-24 LAB — PTH, INTACT AND CALCIUM
Calcium, Total (PTH): 9.8 mg/dL (ref 8.7–10.2)
PTH: 104 pg/mL — ABNORMAL HIGH (ref 15–65)

## 2019-03-26 ENCOUNTER — Telehealth: Payer: Self-pay | Admitting: Internal Medicine

## 2019-03-26 NOTE — Telephone Encounter (Signed)
Pt called and states he was seen last week virtual for sinuses. He is still not feeling well. He is having some sneezing, watery eyes, sore throat, congestion, and coughing up mucous. Please advise if you want pt in the office or do another virtual

## 2019-03-26 NOTE — Telephone Encounter (Signed)
Virtual visit 

## 2019-03-26 NOTE — Telephone Encounter (Signed)
Pt has appt 03-27-19 virtual. kh

## 2019-03-27 ENCOUNTER — Encounter: Payer: Self-pay | Admitting: Family Medicine

## 2019-03-27 ENCOUNTER — Other Ambulatory Visit: Payer: Self-pay

## 2019-03-27 ENCOUNTER — Ambulatory Visit (INDEPENDENT_AMBULATORY_CARE_PROVIDER_SITE_OTHER): Payer: 59 | Admitting: Family Medicine

## 2019-03-27 VITALS — BP 132/82 | Temp 98.2°F | Wt 282.0 lb

## 2019-03-27 DIAGNOSIS — F4329 Adjustment disorder with other symptoms: Secondary | ICD-10-CM

## 2019-03-27 DIAGNOSIS — J301 Allergic rhinitis due to pollen: Secondary | ICD-10-CM

## 2019-03-27 DIAGNOSIS — Z9189 Other specified personal risk factors, not elsewhere classified: Secondary | ICD-10-CM | POA: Diagnosis not present

## 2019-03-27 DIAGNOSIS — C61 Malignant neoplasm of prostate: Secondary | ICD-10-CM

## 2019-03-27 DIAGNOSIS — J209 Acute bronchitis, unspecified: Secondary | ICD-10-CM | POA: Diagnosis not present

## 2019-03-27 DIAGNOSIS — F17200 Nicotine dependence, unspecified, uncomplicated: Secondary | ICD-10-CM

## 2019-03-27 MED ORDER — AMOXICILLIN-POT CLAVULANATE 875-125 MG PO TABS: 1.0000 | ORAL_TABLET | Freq: Two times a day (BID) | ORAL | 0 refills | Status: DC

## 2019-03-27 MED ORDER — ALPRAZOLAM 0.25 MG PO TABS: 0.2500 mg | ORAL_TABLET | Freq: Two times a day (BID) | ORAL | 0 refills | Status: DC | PRN

## 2019-03-27 NOTE — Progress Notes (Signed)
   Subjective:    Patient ID: Devon Cook, male    DOB: 1961/02/10, 58 y.o.   MRN: [id number]  HPI Documentation for virtual telephone encounter. Documentation for virtual audio and video telecommunications through [place] encounter: The patient was located at home. The provider was located in the office. The patient did consent to this visit and is aware of possible charges through their insurance for this visit. The other persons participating in this telemedicine service were none. Time spent on call was 15 minutes This virtual service is not related to other E/M service within previous 7 days. He has an underlying history of allergies and has been on Flonase and Claritin but still having difficulty with head and chest congestion, slight ear congestion with PND that is now become purulent.  He occasionally hears wheezing and is now having a productive cough.  No fever, chills, sore throat, earache.  He continues to smoke.  We are awaiting insurance clearance so I can do a sleep study on him. He also is under a lot of stress dealing with chronic renal failure due to obstruction.  This is interfering with his ability to sleep.  Review of Systems     Objective:   Physical Exam Alert and in no distress otherwise not examined       Assessment & Plan:  Acute bronchitis, unspecified organism - Plan: amoxicillin-clavulanate (AUGMENTIN) 875-125 MG tablet    I think his symptoms are consistent with bronchitis and will place him on Augmentin.  He will call me when he finishes if he is not entirely back to normal. Seasonal allergic rhinitis due to pollen    Continue on allergy medications  At risk for obstructive sleep apnea  Stress and adjustment reaction - Plan: ALPRAZolam (XANAX) 0.25 MG tablet     Discussed the use of this medicine to help with sleep but may need to switch it after we determine his OSA status. Smoker      At this time, he is not ready to quit smoking Prostate  cancer ([place])

## 2019-04-02 ENCOUNTER — Telehealth: Payer: Self-pay | Admitting: Family Medicine

## 2019-04-02 NOTE — Telephone Encounter (Signed)
Pt was advised kh 

## 2019-04-02 NOTE — Telephone Encounter (Signed)
Pt called and states that he is feeling a little better but is still wheezing. He states that it is hard to breath when he lays on his left side and back especially. Please advise pt at [phone]. pt uses [facility] on [address].

## 2019-04-02 NOTE — Telephone Encounter (Signed)
Have him call back when he finishes the entire regimen to let us know how he is doing

## 2019-04-03 ENCOUNTER — Other Ambulatory Visit (HOSPITAL_COMMUNITY): Payer: Self-pay

## 2019-04-04 ENCOUNTER — Ambulatory Visit (HOSPITAL_COMMUNITY)
Admission: RE | Admit: 2019-04-04 | Discharge: 2019-04-04 | Disposition: A | Discharge status: Home / Self Care | Payer: 59 | Source: Ambulatory Visit | Attending: Nephrology | Admitting: Nephrology

## 2019-04-04 ENCOUNTER — Other Ambulatory Visit: Payer: Self-pay

## 2019-04-04 DIAGNOSIS — N189 Chronic kidney disease, unspecified: Secondary | ICD-10-CM | POA: Diagnosis present

## 2019-04-04 DIAGNOSIS — D631 Anemia in chronic kidney disease: Secondary | ICD-10-CM | POA: Insufficient documentation

## 2019-04-04 LAB — RENAL FUNCTION PANEL
Albumin: 3.3 g/dL — ABNORMAL LOW (ref 3.5–5.0)
Anion gap: 15 (ref 5–15)
BUN: 94 mg/dL — ABNORMAL HIGH (ref 6–20)
CO2: 22 mmol/L (ref 22–32)
Calcium: 9.5 mg/dL (ref 8.9–10.3)
Chloride: 97 mmol/L — ABNORMAL LOW (ref 98–111)
Creatinine, Ser: 9.36 mg/dL — ABNORMAL HIGH (ref 0.61–1.24)
GFR calc Af Amer: 6 mL/min — ABNORMAL LOW (ref >60)
GFR calc non Af Amer: 6 mL/min — ABNORMAL LOW (ref >60)
Glucose, Bld: 108 mg/dL — ABNORMAL HIGH (ref 70–99)
Phosphorus: 5.2 mg/dL — ABNORMAL HIGH (ref 2.5–4.6)
Potassium: 4.5 mmol/L (ref 3.5–5.1)
Sodium: 134 mmol/L — ABNORMAL LOW (ref 135–145)

## 2019-04-04 LAB — POCT HEMOGLOBIN-HEMACUE: Hemoglobin: 9.8 g/dL — ABNORMAL LOW (ref 13.0–17.0)

## 2019-04-04 LAB — HEPATITIS B SURFACE ANTIGEN: Hepatitis B Surface Ag: NONREACTIVE

## 2019-04-04 MED ORDER — SODIUM CHLORIDE 0.9 % IV SOLN
510.0000 mg | Freq: Once | INTRAVENOUS | Status: AC
  Administered 2019-04-04: 510 mg via INTRAVENOUS
  Filled 2019-04-04: qty 17

## 2019-04-04 MED ORDER — DARBEPOETIN ALFA 300 MCG/0.6ML IJ SOSY
225.0000 ug | PREFILLED_SYRINGE | INTRAMUSCULAR | Status: DC
  Administered 2019-04-04: 225 ug via SUBCUTANEOUS

## 2019-04-04 MED ORDER — DARBEPOETIN ALFA 25 MCG/0.42ML IJ SOSY
PREFILLED_SYRINGE | INTRAMUSCULAR | Status: AC
  Filled 2019-04-04: qty 0.42

## 2019-04-04 MED ORDER — DARBEPOETIN ALFA 200 MCG/0.4ML IJ SOSY
PREFILLED_SYRINGE | INTRAMUSCULAR | Status: AC
  Filled 2019-04-04: qty 0.4

## 2019-04-05 ENCOUNTER — Other Ambulatory Visit: Payer: Self-pay

## 2019-04-05 ENCOUNTER — Ambulatory Visit
Admission: RE | Admit: 2019-04-05 | Discharge: 2019-04-05 | Disposition: A | Discharge status: Home / Self Care | Payer: 59 | Source: Ambulatory Visit | Attending: Urology | Admitting: Urology

## 2019-04-05 DIAGNOSIS — C7951 Secondary malignant neoplasm of bone: Secondary | ICD-10-CM

## 2019-04-05 MED FILL — Darbepoetin Alfa Soln Prefilled Syringe 200 MCG/0.4ML: INTRAMUSCULAR | Qty: 0.4 | Status: AC

## 2019-04-05 MED FILL — Darbepoetin Alfa Soln Prefilled Syringe 25 MCG/0.42ML: INTRAMUSCULAR | Qty: 0.42 | Status: AC

## 2019-04-09 ENCOUNTER — Telehealth: Payer: Self-pay | Admitting: Family Medicine

## 2019-04-09 DIAGNOSIS — J209 Acute bronchitis, unspecified: Secondary | ICD-10-CM

## 2019-04-09 NOTE — Telephone Encounter (Signed)
I need to know if he is any better.

## 2019-04-09 NOTE — Telephone Encounter (Signed)
Pt called and stated that he finished his antibiotic and is still having chest congestion and wheezing. Pt wants to know what does he need to do now.

## 2019-04-10 MED ORDER — AMOXICILLIN-POT CLAVULANATE 875-125 MG PO TABS: 1.0000 | ORAL_TABLET | Freq: Two times a day (BID) | ORAL | 0 refills | Status: DC

## 2019-04-10 NOTE — Telephone Encounter (Signed)
Pt says he feels better but still congested. Please advise [facility]

## 2019-04-10 NOTE — Telephone Encounter (Signed)
Since he is better but not over it, I will give him another prescription.  Have him call me back if he is not totally back to normal

## 2019-04-10 NOTE — Telephone Encounter (Signed)
Pt was advised [facility] 

## 2019-04-16 ENCOUNTER — Other Ambulatory Visit: Payer: Self-pay | Admitting: Urology

## 2019-04-18 ENCOUNTER — Encounter (HOSPITAL_COMMUNITY): Payer: 59

## 2019-04-22 ENCOUNTER — Encounter: Payer: Self-pay | Admitting: Family Medicine

## 2019-04-22 ENCOUNTER — Ambulatory Visit (INDEPENDENT_AMBULATORY_CARE_PROVIDER_SITE_OTHER): Payer: 59 | Admitting: Family Medicine

## 2019-04-22 ENCOUNTER — Other Ambulatory Visit: Payer: Self-pay

## 2019-04-22 VITALS — BP 138/88 | Wt 282.0 lb

## 2019-04-22 DIAGNOSIS — J209 Acute bronchitis, unspecified: Secondary | ICD-10-CM

## 2019-04-22 DIAGNOSIS — Z9189 Other specified personal risk factors, not elsewhere classified: Secondary | ICD-10-CM

## 2019-04-22 MED ORDER — CIPROFLOXACIN HCL 500 MG PO TABS: 500.0000 mg | ORAL_TABLET | Freq: Two times a day (BID) | ORAL | 0 refills | Status: DC

## 2019-04-22 NOTE — Progress Notes (Signed)
   Subjective:    Patient ID: TRACI PLEMONS, male    DOB: 1961/03/05, 58 y.o.   MRN: [id number]  HPI Documentation for virtual telephone encounter. Documentation for virtual audio and video telecommunications through [place] encounter: The patient was located at home. The provider was located in the office. The patient did consent to this visit and is aware of possible charges through their insurance for this visit. The other persons participating in this telemedicine service were none. This virtual service is not related to other E/M service within previous 7 days. He called concerning continued difficulty with cough and congestion but no fever, chills, sore throat.  He states that he is roughly 80% better.  He has had 2 rounds of antibiotics prior to this.  He also is scheduled for a sleep study but would like to get it scheduled to soon as possible.  Review of Systems     Objective:   Physical Exam Alert and in no distress.  No tachypnea is noted visually.      Assessment & Plan:  Acute bronchitis, unspecified organism - Plan: ciprofloxacin (CIPRO) 500 MG tablet  At risk for obstructive sleep apnea He is to call me when he finishes the antibiotic and if still having difficulty, I will get blood work and a chest x-ray.  He was comfortable with that.  We will also try to get him scheduled for a sleep study sooner.

## 2019-04-24 ENCOUNTER — Ambulatory Visit (HOSPITAL_COMMUNITY)
Admission: RE | Admit: 2019-04-24 | Discharge: 2019-04-24 | Disposition: A | Discharge status: Home / Self Care | Payer: 59 | Source: Ambulatory Visit | Attending: Nephrology | Admitting: Nephrology

## 2019-04-24 ENCOUNTER — Other Ambulatory Visit: Payer: Self-pay

## 2019-04-24 DIAGNOSIS — N185 Chronic kidney disease, stage 5: Secondary | ICD-10-CM | POA: Insufficient documentation

## 2019-04-24 DIAGNOSIS — D631 Anemia in chronic kidney disease: Secondary | ICD-10-CM | POA: Insufficient documentation

## 2019-04-24 LAB — FERRITIN: Ferritin: 472 ng/mL — ABNORMAL HIGH (ref 24–336)

## 2019-04-24 LAB — POCT HEMOGLOBIN-HEMACUE: Hemoglobin: 7.9 g/dL — ABNORMAL LOW (ref 13.0–17.0)

## 2019-04-24 LAB — IRON AND TIBC
Iron: 78 ug/dL (ref 45–182)
Saturation Ratios: 26 % (ref 17.9–39.5)
TIBC: 300 ug/dL (ref 250–450)
UIBC: 222 ug/dL

## 2019-04-24 MED ORDER — DARBEPOETIN ALFA 300 MCG/0.6ML IJ SOSY: 225.0000 ug | PREFILLED_SYRINGE | INTRAMUSCULAR | Status: DC

## 2019-04-24 MED ORDER — DARBEPOETIN ALFA 25 MCG/0.42ML IJ SOSY
PREFILLED_SYRINGE | INTRAMUSCULAR | Status: AC
  Administered 2019-04-24: 25 ug via SUBCUTANEOUS
  Filled 2019-04-24: qty 0.42

## 2019-04-24 MED ORDER — DARBEPOETIN ALFA 200 MCG/0.4ML IJ SOSY
PREFILLED_SYRINGE | INTRAMUSCULAR | Status: AC
  Administered 2019-04-24: 10:00:00 200 ug via SUBCUTANEOUS
  Filled 2019-04-24: qty 0.4

## 2019-04-24 NOTE — Progress Notes (Signed)
Hemocue today 7.9, two weeks ago he was 9.8.  Pt stated he has seen dark stools the last month but was started on a new medication that he says a side effect if that is dark stools.  He also has back pain for the last 2 weeks, and stated he couldn't hardly walk the other day but stated he has a pinched nerve so he thinks that is where his back pain could be coming from.  Pt denies chest pain and shortness of breath.  Reported the above via voicemail to [employer] at [employer] and awaiting a return call from her.

## 2019-04-24 NOTE — Progress Notes (Signed)
Amber called back from [facility].  No new orders received.

## 2019-04-29 ENCOUNTER — Other Ambulatory Visit: Payer: Self-pay

## 2019-04-29 DIAGNOSIS — N185 Chronic kidney disease, stage 5: Secondary | ICD-10-CM

## 2019-04-30 ENCOUNTER — Encounter: Payer: Self-pay | Admitting: *Deleted

## 2019-04-30 ENCOUNTER — Other Ambulatory Visit: Payer: Self-pay | Admitting: *Deleted

## 2019-04-30 ENCOUNTER — Ambulatory Visit (INDEPENDENT_AMBULATORY_CARE_PROVIDER_SITE_OTHER): Payer: 59 | Admitting: Physician Assistant

## 2019-04-30 ENCOUNTER — Ambulatory Visit (HOSPITAL_COMMUNITY)
Admission: RE | Admit: 2019-04-30 | Discharge: 2019-04-30 | Disposition: A | Discharge status: Home / Self Care | Payer: 59 | Source: Ambulatory Visit | Attending: Vascular Surgery | Admitting: Vascular Surgery

## 2019-04-30 ENCOUNTER — Telehealth: Payer: Self-pay

## 2019-04-30 ENCOUNTER — Other Ambulatory Visit: Payer: Self-pay

## 2019-04-30 VITALS — BP 123/74 | HR 64 | Temp 98.1°F | Resp 14 | Ht 74.0 in | Wt 297.0 lb

## 2019-04-30 DIAGNOSIS — N185 Chronic kidney disease, stage 5: Secondary | ICD-10-CM

## 2019-04-30 DIAGNOSIS — F4329 Adjustment disorder with other symptoms: Secondary | ICD-10-CM

## 2019-04-30 NOTE — H&P (View-Only) (Signed)
Established Dialysis Access   History of Present Illness   CLEVELAND YARBRO is a 58 y.o. (14-Aug-1960) male who presents for re-evaluation of permanent access.  He is s/p L brachiocephalic fistula creation by Dr. Carlis Abbott on 03/15/19.  He states L arm incision is well healed.  CKD is managed by Dr. Lorrene Reid who left a message with our office for urgent [facility] placement as patient will need to be started on HD. The patient denies any symptoms of uremia however does state that he is noticing an increase in BLE edema.  He is not taking any blood thinners.  He does not have a pacemaker.  The patient's PMH, PSH, SH, and FamHx were reviewed on and are unchanged from prior visit.  Current Outpatient Medications  Medication Sig Dispense Refill  . allopurinol (ZYLOPRIM) 100 MG tablet TAKE 1 TABLET BY MOUTH ONCE DAILY (Patient taking differently: Take 100 mg by mouth 2 (two) times daily. ) 30 tablet 0  . ALPRAZolam (XANAX) 0.25 MG tablet Take 1 tablet (0.25 mg total) by mouth 2 (two) times daily as needed for anxiety. 20 tablet 0  . amLODipine (NORVASC) 10 MG tablet Take 10 mg by mouth every morning.     . ciprofloxacin (CIPRO) 500 MG tablet Take 1 tablet (500 mg total) by mouth 2 (two) times daily. 20 tablet 0  . epoetin alfa (EPOGEN,PROCRIT) 2000 UNIT/ML injection 2,000 Units. Every 2 weeks    . ferric citrate (AURYXIA) 1 GM 210 MG(Fe) tablet Take 210 mg by mouth 2 (two) times daily with a meal.    . guaiFENesin-dextromethorphan (ROBITUSSIN DM) 100-10 MG/5ML syrup Take 5 mLs by mouth every 4 (four) hours as needed for cough.    Marland Kitchen HYDROcodone-acetaminophen (NORCO) 5-325 MG tablet Take 1 tablet by mouth every 6 (six) hours as needed for moderate pain. 12 tablet 0  . Multiple Vitamins-Minerals (MULTIVITAMIN WITH MINERALS) tablet Take 1 tablet by mouth daily.      . pantoprazole (PROTONIX) 40 MG tablet TAKE 1 TABLET BY MOUTH ONCE DAILY (Patient taking differently: Take 40 mg by mouth every morning. ) 30 tablet 0   . sodium bicarbonate 650 MG tablet Take 2 tablets (1,300 mg total) by mouth 2 (two) times daily. (Patient taking differently: Take 1,300 mg by mouth 3 (three) times daily. ) 90 tablet 1   No current facility-administered medications for this visit.     On ROS today: 10 system ROS is negative unless otherwise noted in HPI   Physical Examination   Vitals:   04/30/19 1324  BP: 123/74  Pulse: 64  Resp: 14  Temp: 98.1 F (36.7 C)  TempSrc: Temporal  SpO2: 98%  Weight: 297 lb (134.7 kg)  Height: 6\' 2"  (1.88 m)   Body mass index is 38.13 kg/m.  General Alert, O x 3, WD, NAD  Pulmonary Sym exp, good B air movt, CTA B  Cardiac RRR, Nl S1, S2  Vascular Vessel Right Left  Radial Palpable Palpable  Brachial Palpable Palpable  Ulnar Not palpable Not palpable    Musculo- skeletal M/S 5/5 throughout  , Extremities without ischemic changes; palpable thrill left arm fistula    Neurologic A&O; CN grossly intact     Non-invasive Vascular Imaging   left Arm Access Duplex  (04/30/19):   Diameters:  >6 mm  Depth:  5-6 mm    Medical Decision Making   ORIS STAFFIERI is a 58 y.o. male who presents with ESRD; will need to initiate HD  Patent L arm brachiocephalic fistula with palpable thrill  He will require placement of TDC as he has only 6 weeks out from fistula creation  TDC will be placed by Dr. Carlis Abbott on Monday 05/06/19  I also discussed with the patient that the cephalic vein is borderline too deep to reliably access for dialysis on a weekly basis  Dr. Carlis Abbott will evaluate the patient preoperatively to determine if patient will require a superficial cessation of fistula in addition to [facility] placement Risk, benefits, and alternatives to access surgery were discussed.   The patient is aware the risks include but are not limited to: bleeding, infection, nerve damage, thrombosis, failure to mature, and need for additional procedures.   The patient and his wife agree to proceed  with the procedure.   Dagoberto Ligas PA-C [facility] [place] Office: [phone]  Clinic MD: Dr. Carlis Abbott

## 2019-04-30 NOTE — Telephone Encounter (Signed)
LVM for pt asking if he has heard from the sleep center to see if they could schedule him sooner to have sleep study . [place]

## 2019-04-30 NOTE — Telephone Encounter (Signed)
Can pt have a refill on xanax per pt request. Pt Kidney doctor thinks fluid build in pt due to abx wbc is abnormal and she thinks some kind of infection is going on. Pt has to go by there office to give a u/a. Vascular was also seen and schedule for cath in neck which will be used until line in are has matured. Starting dialysis next week one day next week. Please advise [facility]

## 2019-04-30 NOTE — Progress Notes (Signed)
Established Dialysis Access   History of Present Illness   Devon Cook is a 58 y.o. (April 21, 1961) male who presents for re-evaluation of permanent access.  He is s/p L brachiocephalic fistula creation by Dr. Carlis Abbott on 03/15/19.  He states L arm incision is well healed.  CKD is managed by Dr. Lorrene Reid who left a message with our office for urgent [facility] placement as patient will need to be started on HD. The patient denies any symptoms of uremia however does state that he is noticing an increase in BLE edema.  He is not taking any blood thinners.  He does not have a pacemaker.  The patient's PMH, PSH, SH, and FamHx were reviewed on and are unchanged from prior visit.  Current Outpatient Medications  Medication Sig Dispense Refill  . allopurinol (ZYLOPRIM) 100 MG tablet TAKE 1 TABLET BY MOUTH ONCE DAILY (Patient taking differently: Take 100 mg by mouth 2 (two) times daily. ) 30 tablet 0  . ALPRAZolam (XANAX) 0.25 MG tablet Take 1 tablet (0.25 mg total) by mouth 2 (two) times daily as needed for anxiety. 20 tablet 0  . amLODipine (NORVASC) 10 MG tablet Take 10 mg by mouth every morning.     . ciprofloxacin (CIPRO) 500 MG tablet Take 1 tablet (500 mg total) by mouth 2 (two) times daily. 20 tablet 0  . epoetin alfa (EPOGEN,PROCRIT) 2000 UNIT/ML injection 2,000 Units. Every 2 weeks    . ferric citrate (AURYXIA) 1 GM 210 MG(Fe) tablet Take 210 mg by mouth 2 (two) times daily with a meal.    . guaiFENesin-dextromethorphan (ROBITUSSIN DM) 100-10 MG/5ML syrup Take 5 mLs by mouth every 4 (four) hours as needed for cough.    Marland Kitchen HYDROcodone-acetaminophen (NORCO) 5-325 MG tablet Take 1 tablet by mouth every 6 (six) hours as needed for moderate pain. 12 tablet 0  . Multiple Vitamins-Minerals (MULTIVITAMIN WITH MINERALS) tablet Take 1 tablet by mouth daily.      . pantoprazole (PROTONIX) 40 MG tablet TAKE 1 TABLET BY MOUTH ONCE DAILY (Patient taking differently: Take 40 mg by mouth every morning. ) 30 tablet 0   . sodium bicarbonate 650 MG tablet Take 2 tablets (1,300 mg total) by mouth 2 (two) times daily. (Patient taking differently: Take 1,300 mg by mouth 3 (three) times daily. ) 90 tablet 1   No current facility-administered medications for this visit.     On ROS today: 10 system ROS is negative unless otherwise noted in HPI   Physical Examination   Vitals:   04/30/19 1324  BP: 123/74  Pulse: 64  Resp: 14  Temp: 98.1 F (36.7 C)  TempSrc: Temporal  SpO2: 98%  Weight: 297 lb (134.7 kg)  Height: 6\' 2"  (1.88 m)   Body mass index is 38.13 kg/m.  General Alert, O x 3, WD, NAD  Pulmonary Sym exp, good B air movt, CTA B  Cardiac RRR, Nl S1, S2  Vascular Vessel Right Left  Radial Palpable Palpable  Brachial Palpable Palpable  Ulnar Not palpable Not palpable    Musculo- skeletal M/S 5/5 throughout  , Extremities without ischemic changes; palpable thrill left arm fistula    Neurologic A&O; CN grossly intact     Non-invasive Vascular Imaging   left Arm Access Duplex  (04/30/19):   Diameters:  >6 mm  Depth:  5-6 mm    Medical Decision Making   Devon Cook is a 58 y.o. male who presents with ESRD; will need to initiate HD  Patent L arm brachiocephalic fistula with palpable thrill  He will require placement of TDC as he has only 6 weeks out from fistula creation  TDC will be placed by Dr. Carlis Abbott on Monday 05/06/19  I also discussed with the patient that the cephalic vein is borderline too deep to reliably access for dialysis on a weekly basis  Dr. Carlis Abbott will evaluate the patient preoperatively to determine if patient will require a superficial cessation of fistula in addition to [facility] placement Risk, benefits, and alternatives to access surgery were discussed.   The patient is aware the risks include but are not limited to: bleeding, infection, nerve damage, thrombosis, failure to mature, and need for additional procedures.   The patient and his wife agree to proceed  with the procedure.   Dagoberto Ligas PA-C [facility] [place] Office: [phone]  Clinic MD: Dr. Carlis Abbott

## 2019-05-01 MED ORDER — ALPRAZOLAM 0.25 MG PO TABS: 0.2500 mg | ORAL_TABLET | Freq: Every day | ORAL | 1 refills | Status: DC | PRN

## 2019-05-01 NOTE — Telephone Encounter (Signed)
I called it in 

## 2019-05-01 NOTE — Addendum Note (Signed)
Addended by: [name] on: 05/01/2019 09:35 AM   Modules accepted: Orders

## 2019-05-02 ENCOUNTER — Other Ambulatory Visit (HOSPITAL_COMMUNITY)
Admission: RE | Admit: 2019-05-02 | Discharge: 2019-05-02 | Disposition: A | Discharge status: Home / Self Care | Payer: 59 | Source: Ambulatory Visit | Attending: Vascular Surgery | Admitting: Vascular Surgery

## 2019-05-02 ENCOUNTER — Encounter (HOSPITAL_COMMUNITY): Payer: 59

## 2019-05-02 DIAGNOSIS — Z01812 Encounter for preprocedural laboratory examination: Secondary | ICD-10-CM | POA: Diagnosis present

## 2019-05-02 DIAGNOSIS — Z20828 Contact with and (suspected) exposure to other viral communicable diseases: Secondary | ICD-10-CM | POA: Insufficient documentation

## 2019-05-02 NOTE — Telephone Encounter (Signed)
Pt was advised Devon Cook 

## 2019-05-03 ENCOUNTER — Other Ambulatory Visit: Payer: Self-pay

## 2019-05-03 ENCOUNTER — Encounter (HOSPITAL_COMMUNITY): Payer: Self-pay | Admitting: *Deleted

## 2019-05-03 LAB — NOVEL CORONAVIRUS, NAA (HOSP ORDER, SEND-OUT TO REF LAB; TAT 18-24 HRS): SARS-CoV-2, NAA: NOT DETECTED

## 2019-05-03 MED ORDER — DEXTROSE 5 % IV SOLN
3.0000 g | INTRAVENOUS | Status: AC
  Administered 2019-05-06: 3 g via INTRAVENOUS
  Filled 2019-05-03: qty 3000
  Filled 2019-05-03: qty 3

## 2019-05-03 NOTE — Progress Notes (Signed)
   05/03/19 1545  OBSTRUCTIVE SLEEP APNEA  Have you ever been diagnosed with sleep apnea through a sleep study? No  Do you snore loudly (loud enough to be heard through closed doors)?  1  Do you often feel tired, fatigued, or sleepy during the daytime (such as falling asleep during driving or talking to someone)? 0  Has anyone observed you stop breathing during your sleep? 1  Do you have, or are you being treated for high blood pressure? 1  BMI more than 35 kg/m2? 1  Age > 50 (1-yes) 1  Neck circumference greater than:Male 16 inches or larger, Male 17inches or larger? 1  Male Gender (Yes=1) 1  Obstructive Sleep Apnea Score 7

## 2019-05-03 NOTE — Progress Notes (Signed)
Pt denies SOB, chest pain, and being under the care of a cardiologist. Pt PCP is Dr Redmond School. Pt denies having a stress test, echo and cardiac cath. Pt denies having a chest x ray in the last year. Pt made aware to stop taking vitamins, fish oil and herbal medications. Do not take any NSAIDs ie: Ibuprofen, Advil, Naproxen (Aleve), Motrin, BC and Goody Powder. Pt verbalized understanding of all pre-op instructions.

## 2019-05-06 ENCOUNTER — Encounter (HOSPITAL_COMMUNITY)
Admission: RE | Disposition: A | Discharge status: Home / Self Care | Payer: Self-pay | Source: Home / Self Care | Attending: Vascular Surgery

## 2019-05-06 ENCOUNTER — Ambulatory Visit (HOSPITAL_COMMUNITY): Payer: 59

## 2019-05-06 ENCOUNTER — Other Ambulatory Visit: Payer: Self-pay

## 2019-05-06 ENCOUNTER — Ambulatory Visit (HOSPITAL_COMMUNITY): Payer: 59 | Admitting: Anesthesiology

## 2019-05-06 ENCOUNTER — Encounter (HOSPITAL_COMMUNITY): Payer: Self-pay

## 2019-05-06 ENCOUNTER — Ambulatory Visit (HOSPITAL_COMMUNITY)
Admission: RE | Admit: 2019-05-06 | Discharge: 2019-05-06 | Disposition: A | Discharge status: Home / Self Care | Payer: 59 | Attending: Vascular Surgery | Admitting: Vascular Surgery

## 2019-05-06 DIAGNOSIS — Z79899 Other long term (current) drug therapy: Secondary | ICD-10-CM | POA: Insufficient documentation

## 2019-05-06 DIAGNOSIS — N185 Chronic kidney disease, stage 5: Secondary | ICD-10-CM

## 2019-05-06 DIAGNOSIS — F17200 Nicotine dependence, unspecified, uncomplicated: Secondary | ICD-10-CM | POA: Diagnosis not present

## 2019-05-06 DIAGNOSIS — Z6838 Body mass index (BMI) 38.0-38.9, adult: Secondary | ICD-10-CM | POA: Insufficient documentation

## 2019-05-06 DIAGNOSIS — N186 End stage renal disease: Secondary | ICD-10-CM | POA: Insufficient documentation

## 2019-05-06 DIAGNOSIS — Z452 Encounter for adjustment and management of vascular access device: Secondary | ICD-10-CM

## 2019-05-06 DIAGNOSIS — K219 Gastro-esophageal reflux disease without esophagitis: Secondary | ICD-10-CM | POA: Diagnosis not present

## 2019-05-06 DIAGNOSIS — I12 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 stage renal disease: Secondary | ICD-10-CM | POA: Diagnosis present

## 2019-05-06 DIAGNOSIS — D631 Anemia in chronic kidney disease: Secondary | ICD-10-CM | POA: Insufficient documentation

## 2019-05-06 HISTORY — PX: INSERTION OF DIALYSIS CATHETER: SHX1324

## 2019-05-06 SURGERY — INSERTION OF DIALYSIS CATHETER
Anesthesia: General | Site: Neck | Laterality: Right

## 2019-05-06 MED ORDER — SODIUM CHLORIDE 0.9 % IV SOLN
1.0000 g | INTRAVENOUS | Status: DC
  Filled 2019-05-06: qty 10

## 2019-05-06 MED ORDER — FENTANYL CITRATE (PF) 250 MCG/5ML IJ SOLN
INTRAMUSCULAR | Status: AC
  Filled 2019-05-06: qty 5

## 2019-05-06 MED ORDER — MIDAZOLAM HCL 2 MG/2ML IJ SOLN
INTRAMUSCULAR | Status: AC
  Filled 2019-05-06: qty 2

## 2019-05-06 MED ORDER — PROPOFOL 10 MG/ML IV BOLUS
INTRAVENOUS | Status: DC | PRN
  Administered 2019-05-06: 200 mg via INTRAVENOUS

## 2019-05-06 MED ORDER — SODIUM CHLORIDE 0.9 % IV SOLN
INTRAVENOUS | Status: DC | PRN
  Administered 2019-05-06: 500 mL

## 2019-05-06 MED ORDER — DEXAMETHASONE SODIUM PHOSPHATE 10 MG/ML IJ SOLN
INTRAMUSCULAR | Status: DC | PRN
  Administered 2019-05-06: 4 mg via INTRAVENOUS

## 2019-05-06 MED ORDER — EPHEDRINE SULFATE 50 MG/ML IJ SOLN
INTRAMUSCULAR | Status: DC | PRN
  Administered 2019-05-06 (×2): 5 mg via INTRAVENOUS

## 2019-05-06 MED ORDER — SODIUM CHLORIDE 0.9 % IV SOLN
INTRAVENOUS | Status: DC
  Administered 2019-05-06: 12:00:00 via INTRAVENOUS

## 2019-05-06 MED ORDER — SODIUM CHLORIDE 0.9 % IV SOLN
INTRAVENOUS | Status: AC
  Filled 2019-05-06: qty 1.2

## 2019-05-06 MED ORDER — ONDANSETRON HCL 4 MG/2ML IJ SOLN
INTRAMUSCULAR | Status: AC
  Filled 2019-05-06: qty 2

## 2019-05-06 MED ORDER — CEFAZOLIN SODIUM 1 G IJ SOLR
INTRAMUSCULAR | Status: AC
  Filled 2019-05-06: qty 30

## 2019-05-06 MED ORDER — ONDANSETRON HCL 4 MG/2ML IJ SOLN: 4.0000 mg | Freq: Four times a day (QID) | INTRAMUSCULAR | Status: DC | PRN

## 2019-05-06 MED ORDER — CHLORHEXIDINE GLUCONATE 4 % EX LIQD: 60.0000 mL | Freq: Once | CUTANEOUS | Status: DC

## 2019-05-06 MED ORDER — LIDOCAINE 2% (20 MG/ML) 5 ML SYRINGE
INTRAMUSCULAR | Status: DC | PRN
  Administered 2019-05-06: 60 mg via INTRAVENOUS

## 2019-05-06 MED ORDER — 0.9 % SODIUM CHLORIDE (POUR BTL) OPTIME
TOPICAL | Status: DC | PRN
  Administered 2019-05-06: 1000 mL

## 2019-05-06 MED ORDER — HYDROCODONE-ACETAMINOPHEN 5-325 MG PO TABS: 1.0000 | ORAL_TABLET | Freq: Two times a day (BID) | ORAL | 0 refills | Status: DC | PRN

## 2019-05-06 MED ORDER — ONDANSETRON HCL 4 MG/2ML IJ SOLN
INTRAMUSCULAR | Status: DC | PRN
  Administered 2019-05-06: 4 mg via INTRAVENOUS

## 2019-05-06 MED ORDER — OXYCODONE HCL 5 MG/5ML PO SOLN: 5.0000 mg | Freq: Once | ORAL | Status: DC | PRN

## 2019-05-06 MED ORDER — FENTANYL CITRATE (PF) 100 MCG/2ML IJ SOLN: 25.0000 ug | INTRAMUSCULAR | Status: DC | PRN

## 2019-05-06 MED ORDER — HEPARIN SODIUM (PORCINE) 1000 UNIT/ML IJ SOLN
INTRAMUSCULAR | Status: DC | PRN
  Administered 2019-05-06: 3400 [IU]

## 2019-05-06 MED ORDER — FENTANYL CITRATE (PF) 100 MCG/2ML IJ SOLN
INTRAMUSCULAR | Status: DC | PRN
  Administered 2019-05-06 (×2): 50 ug via INTRAVENOUS

## 2019-05-06 MED ORDER — OXYCODONE HCL 5 MG PO TABS: 5.0000 mg | ORAL_TABLET | Freq: Once | ORAL | Status: DC | PRN

## 2019-05-06 MED ORDER — DEXAMETHASONE SODIUM PHOSPHATE 10 MG/ML IJ SOLN
INTRAMUSCULAR | Status: AC
  Filled 2019-05-06: qty 1

## 2019-05-06 MED ORDER — PHENYLEPHRINE HCL-NACL 10-0.9 MG/250ML-% IV SOLN
INTRAVENOUS | Status: DC | PRN
  Administered 2019-05-06: 50 ug/min via INTRAVENOUS

## 2019-05-06 SURGICAL SUPPLY — 51 items
ARMBAND PINK RESTRICT EXTREMIT (MISCELLANEOUS) ×4 IMPLANT
BAG DECANTER FOR FLEXI CONT (MISCELLANEOUS) ×4 IMPLANT
BIOPATCH RED 1 DISK 7.0 (GAUZE/BANDAGES/DRESSINGS) ×3 IMPLANT
BIOPATCH RED 1IN DISK 7.0MM (GAUZE/BANDAGES/DRESSINGS) ×1
CANISTER SUCT 3000ML PPV (MISCELLANEOUS) ×4 IMPLANT
CATH PALINDROME RT-P 15FX19CM (CATHETERS) IMPLANT
CATH PALINDROME RT-P 15FX23CM (CATHETERS) ×4 IMPLANT
CATH PALINDROME RT-P 15FX28CM (CATHETERS) IMPLANT
CATH PALINDROME RT-P 15FX55CM (CATHETERS) IMPLANT
CLIP LIGATING EXTRA MED SLVR (CLIP) ×4 IMPLANT
CLIP LIGATING EXTRA SM BLUE (MISCELLANEOUS) ×4 IMPLANT
COVER PROBE W GEL 5X96 (DRAPES) ×4 IMPLANT
COVER SURGICAL LIGHT HANDLE (MISCELLANEOUS) ×4 IMPLANT
COVER WAND RF STERILE (DRAPES) ×4 IMPLANT
DECANTER SPIKE VIAL GLASS SM (MISCELLANEOUS) ×4 IMPLANT
DERMABOND ADVANCED (GAUZE/BANDAGES/DRESSINGS) ×2
DERMABOND ADVANCED .7 DNX12 (GAUZE/BANDAGES/DRESSINGS) ×2 IMPLANT
DRAPE C-ARM 42X72 X-RAY (DRAPES) ×4 IMPLANT
DRAPE CHEST BREAST 15X10 FENES (DRAPES) ×4 IMPLANT
DRSG COVADERM 4X6 (GAUZE/BANDAGES/DRESSINGS) ×4 IMPLANT
ELECT REM PT RETURN 9FT ADLT (ELECTROSURGICAL) ×4
ELECTRODE REM PT RTRN 9FT ADLT (ELECTROSURGICAL) ×2 IMPLANT
GAUZE 4X4 16PLY RFD (DISPOSABLE) ×4 IMPLANT
GAUZE SPONGE 4X4 12PLY STRL (GAUZE/BANDAGES/DRESSINGS) ×4 IMPLANT
GLOVE SS BIOGEL STRL SZ 7.5 (GLOVE) ×2 IMPLANT
GLOVE SUPERSENSE BIOGEL SZ 7.5 (GLOVE) ×2
GOWN STRL REUS W/ TWL LRG LVL3 (GOWN DISPOSABLE) ×6 IMPLANT
GOWN STRL REUS W/TWL LRG LVL3 (GOWN DISPOSABLE) ×6
KIT BASIN OR (CUSTOM PROCEDURE TRAY) ×4 IMPLANT
KIT TURNOVER KIT B (KITS) ×4 IMPLANT
NEEDLE 18GX1X1/2 (RX/OR ONLY) (NEEDLE) ×4 IMPLANT
NEEDLE 22X1 1/2 (OR ONLY) (NEEDLE) IMPLANT
NEEDLE HYPO 25GX1X1/2 BEV (NEEDLE) ×4 IMPLANT
NS IRRIG 1000ML POUR BTL (IV SOLUTION) ×4 IMPLANT
PACK CV ACCESS (CUSTOM PROCEDURE TRAY) ×4 IMPLANT
PACK SURGICAL SETUP 50X90 (CUSTOM PROCEDURE TRAY) ×4 IMPLANT
PAD ARMBOARD 7.5X6 YLW CONV (MISCELLANEOUS) ×8 IMPLANT
SOAP 2 % CHG 4 OZ (WOUND CARE) ×4 IMPLANT
SUT ETHILON 3 0 PS 1 (SUTURE) ×4 IMPLANT
SUT PROLENE 6 0 CC (SUTURE) ×4 IMPLANT
SUT VIC AB 3-0 SH 27 (SUTURE) ×2
SUT VIC AB 3-0 SH 27X BRD (SUTURE) ×2 IMPLANT
SUT VICRYL 4-0 PS2 18IN ABS (SUTURE) ×4 IMPLANT
SYR 10ML LL (SYRINGE) ×4 IMPLANT
SYR 20ML LL LF (SYRINGE) ×4 IMPLANT
SYR 5ML LL (SYRINGE) ×8 IMPLANT
SYR CONTROL 10ML LL (SYRINGE) ×4 IMPLANT
TOWEL GREEN STERILE (TOWEL DISPOSABLE) ×8 IMPLANT
TOWEL GREEN STERILE FF (TOWEL DISPOSABLE) ×4 IMPLANT
UNDERPAD 30X30 (UNDERPADS AND DIAPERS) ×4 IMPLANT
WATER STERILE IRR 1000ML POUR (IV SOLUTION) ×4 IMPLANT

## 2019-05-06 NOTE — Transfer of Care (Signed)
Immediate Anesthesia Transfer of Care Note  Patient: Devon Cook  Procedure(s) Performed: INSERTION OF TUNNELED  DIALYSIS CATHETER (Right Neck)  Patient Location: PACU  Anesthesia Type:General  Level of Consciousness: awake and alert   Airway & Oxygen Therapy: Patient Spontanous Breathing and Patient connected to nasal cannula oxygen  Post-op Assessment: Report given to RN and Post -op Vital signs reviewed and stable  Post vital signs: Reviewed and stable  Last Vitals:  Vitals Value Taken Time  BP 136/80 05/06/19 1333  Temp    Pulse 75 05/06/19 1334  Resp 18 05/06/19 1334  SpO2 99 % 05/06/19 1334  Vitals shown include unvalidated device data.  Last Pain:  Vitals:   05/06/19 1119  TempSrc: Oral  PainSc:       Patients Stated Pain Goal: 3 (72/90/21 1155)  Complications: No apparent anesthesia complications

## 2019-05-06 NOTE — Interval H&P Note (Signed)
History and Physical Interval Note:  05/06/2019 11:12 AM  Devon Cook  has presented today for surgery, with the diagnosis of CHRONIC KIDNEY DISEASE FOR HEMODIALYSIS ACCESS.  The various methods of treatment have been discussed with the patient and family. After consideration of risks, benefits and other options for treatment, the patient has consented to  Procedure(s): INSERTION OF TUNNELED  DIALYSIS CATHETER (N/A) FISTULA SUPERFICIALIZATION LEFT ARM (Left) as a surgical intervention.  The patient's history has been reviewed, patient examined, no change in status, stable for surgery.  I have reviewed the patient's chart and labs.  Questions were answered to the patient's satisfaction.     Curt Jews

## 2019-05-06 NOTE — Anesthesia Preprocedure Evaluation (Signed)
Anesthesia Evaluation  Patient identified by MRN, date of birth, ID band Patient awake    Reviewed: Allergy & Precautions, H&P , NPO status , Patient's Chart, lab work & pertinent test results  Airway Mallampati: II   Neck ROM: full    Dental   Pulmonary Current Smoker and Patient abstained from smoking.,    breath sounds clear to auscultation       Cardiovascular hypertension,  Rhythm:regular Rate:Normal     Neuro/Psych    GI/Hepatic GERD  ,(+) Hepatitis -, C  Endo/Other  Morbid obesity  Renal/GU ESRF and DialysisRenal disease     Musculoskeletal   Abdominal   Peds  Hematology  (+) Blood dyscrasia, anemia ,   Anesthesia Other Findings   Reproductive/Obstetrics                             Anesthesia Physical Anesthesia Plan  ASA: III  Anesthesia Plan: General   Post-op Pain Management:    Induction: Intravenous  PONV Risk Score and Plan: 1 and Ondansetron, Dexamethasone and Treatment may vary due to age or medical condition  Airway Management Planned: LMA  Additional Equipment:   Intra-op Plan:   Post-operative Plan:   Informed Consent: I have reviewed the patients History and Physical, chart, labs and discussed the procedure including the risks, benefits and alternatives for the proposed anesthesia with the patient or authorized representative who has indicated his/her understanding and acceptance.       Plan Discussed with: CRNA, Anesthesiologist and Surgeon  Anesthesia Plan Comments:         Anesthesia Quick Evaluation

## 2019-05-06 NOTE — Discharge Instructions (Signed)
° °  [facility]  Discharge Instructions  AV Fistula or Graft Surgery for Dialysis Access  Please refer to the following instructions for your post-procedure care. Your surgeon or physician assistant will discuss any changes with you.  Activity  You may drive the day following your surgery, if you are comfortable and no longer taking prescription pain medication. Resume full activity as the soreness in your incision resolves.  Bathing/Showering  You may shower after you go home. Keep your incision dry for 48 hours. Do not soak in a bathtub, hot tub, or swim until the incision heals completely. You may not shower if you have a hemodialysis catheter.  Incision Care  Clean your incision with mild soap and water after 48 hours. Pat the area dry with a clean towel. You do not need a bandage unless otherwise instructed. Do not apply any ointments or creams to your incision. You may have skin glue on your incision. Do not peel it off. It will come off on its own in about one week. Your arm may swell a bit after surgery. To reduce swelling use pillows to elevate your arm so it is above your heart. Your doctor will tell you if you need to lightly wrap your arm with an ACE bandage.  Diet  Resume your normal diet. There are not special food restrictions following this procedure. In order to heal from your surgery, it is CRITICAL to get adequate nutrition. Your body requires vitamins, minerals, and protein. Vegetables are the best source of vitamins and minerals. Vegetables also provide the perfect balance of protein. Processed food has little nutritional value, so try to avoid this.  Medications  Resume taking all of your medications. If your incision is causing pain, you may take over-the counter pain relievers such as acetaminophen (Tylenol). If you were prescribed a stronger pain medication, please be aware these medications can cause nausea and constipation. Prevent  nausea by taking the medication with a snack or meal. Avoid constipation by drinking plenty of fluids and eating foods with high amount of fiber, such as fruits, vegetables, and grains.  Do not take Tylenol if you are taking prescription pain medications.  Follow up Your surgeon may want to see you in the office following your access surgery. If so, this will be arranged at the time of your surgery.  Please call us immediately for any of the following conditions:  Increased pain, redness, drainage (pus) from your incision site Fever of 101 degrees or higher Severe or worsening pain at your incision site Hand pain or numbness.  Reduce your risk of vascular disease:  Stop smoking. If you would like help, call [facility] at 1-800-QUIT-NOW (954) 320-3595) or [place] at [place] your cholesterol Maintain a desired weight Control your diabetes Keep your blood pressure down  Dialysis  It will take several weeks to several months for your new dialysis access to be ready for use. Your surgeon will determine when it is okay to use it. Your nephrologist will continue to direct your dialysis. You can continue to use your Permcath until your new access is ready for use.   05/06/2019 Devon Cook [id number] [birth year]  Surgeon(s): Early, Arvilla Meres, MD  Procedure(s): INSERTION OF TUNNELED  DIALYSIS CATHETER   If you have any questions, please call the office at [phone].

## 2019-05-06 NOTE — Anesthesia Procedure Notes (Signed)
Procedure Name: LMA Insertion Date/Time: 05/06/2019 12:40 PM Performed by: Inda Coke, CRNA Pre-anesthesia Checklist: Patient identified, Emergency Drugs available, Suction available and Patient being monitored Patient Re-evaluated:Patient Re-evaluated prior to induction Oxygen Delivery Method: Circle System Utilized Preoxygenation: Pre-oxygenation with 100% oxygen Induction Type: IV induction Ventilation: Mask ventilation without difficulty and Oral airway inserted - appropriate to patient size LMA: LMA inserted LMA Size: 5.0 Number of attempts: 1 Airway Equipment and Method: Bite block Placement Confirmation: positive ETCO2 Tube secured with: Tape Dental Injury: Teeth and Oropharynx as per pre-operative assessment

## 2019-05-07 ENCOUNTER — Encounter (HOSPITAL_COMMUNITY): Payer: Self-pay | Admitting: Vascular Surgery

## 2019-05-07 LAB — POCT I-STAT, CHEM 8
BUN: 111 mg/dL — ABNORMAL HIGH (ref 6–20)
Calcium, Ion: 1.23 mmol/L (ref 1.15–1.40)
Chloride: 107 mmol/L (ref 98–111)
Creatinine, Ser: 11.2 mg/dL — ABNORMAL HIGH (ref 0.61–1.24)
Glucose, Bld: 89 mg/dL (ref 70–99)
HCT: 24 % — ABNORMAL LOW (ref 39.0–52.0)
Hemoglobin: 8.2 g/dL — ABNORMAL LOW (ref 13.0–17.0)
Potassium: 5.6 mmol/L — ABNORMAL HIGH (ref 3.5–5.1)
Sodium: 138 mmol/L (ref 135–145)
TCO2: 24 mmol/L (ref 22–32)

## 2019-05-07 NOTE — Anesthesia Postprocedure Evaluation (Signed)
Anesthesia Post Note  Patient: Devon Cook  Procedure(s) Performed: INSERTION OF TUNNELED  DIALYSIS CATHETER (Right Neck)     Patient location during evaluation: PACU Anesthesia Type: General Level of consciousness: awake and alert Pain management: pain level controlled Vital Signs Assessment: post-procedure vital signs reviewed and stable Respiratory status: spontaneous breathing, nonlabored ventilation, respiratory function stable and patient connected to nasal cannula oxygen Cardiovascular status: blood pressure returned to baseline and stable Postop Assessment: no apparent nausea or vomiting Anesthetic complications: no    Last Vitals:  Vitals:   05/06/19 1417 05/06/19 1430  BP: (!) 141/89 (!) 141/89  Pulse: 70 80  Resp: 15 14  Temp:    SpO2: 100% 100%    Last Pain:  Vitals:   05/06/19 1430  TempSrc:   PainSc: 0-No pain                 Thereasa Iannello S

## 2019-05-08 ENCOUNTER — Encounter (HOSPITAL_COMMUNITY): Payer: 59

## 2019-05-09 ENCOUNTER — Encounter: Payer: Self-pay | Admitting: Family Medicine

## 2019-05-09 ENCOUNTER — Ambulatory Visit: Payer: 59 | Admitting: Family Medicine

## 2019-05-09 ENCOUNTER — Other Ambulatory Visit: Payer: Self-pay

## 2019-05-09 VITALS — BP 156/98 | HR 56 | Temp 96.9°F | Resp 20 | Wt 304.2 lb

## 2019-05-09 DIAGNOSIS — N184 Chronic kidney disease, stage 4 (severe): Secondary | ICD-10-CM

## 2019-05-09 DIAGNOSIS — H6982 Other specified disorders of Eustachian tube, left ear: Secondary | ICD-10-CM | POA: Diagnosis not present

## 2019-05-09 DIAGNOSIS — J029 Acute pharyngitis, unspecified: Secondary | ICD-10-CM

## 2019-05-09 DIAGNOSIS — H6992 Unspecified Eustachian tube disorder, left ear: Secondary | ICD-10-CM

## 2019-05-09 NOTE — Progress Notes (Signed)
   Subjective:    Patient ID: Devon Cook, male    DOB: [birth year], 58 y.o.   MRN: [id number]  HPI He complains of a several day history of difficulty hearing from the left ear as well as dry throat.  He apparently bit his tongue.  He is now getting dialysis every other day.  He notes weight gain, some slight wheezing.   Review of Systems     Objective:   Physical Exam Alert and in no distress.  Normal breathing pattern is noted.  Left TM is slightly retracted but not erythematous.  Right TM normal.  Throat is clear.  No lesions were noted.  Neck is supple without adenopathy.       Assessment & Plan:  Dysfunction of left eustachian tube  Sore throat  Chronic renal failure, stage 4 (severe) (HCC) Recommend swallowing and yawning to help with the eustachian tube dysfunction.  Also can use Afrin nasal spray judiciously for a couple of days to see if that will help. Explained that I do not see any pathologic in his throat. He will continue to be followed at the dialysis center.  Explained that they will be in the process of taking some fluid off of him.

## 2019-05-15 NOTE — Op Note (Signed)
    OPERATIVE REPORT  DATE OF SURGERY: 05/15/2019  PATIENT: BASIR NIVEN, 58 y.o. male MRN: [id number]  DOB: January 09, 1961  PRE-OPERATIVE DIAGNOSIS: End-stage renal disease  POST-OPERATIVE DIAGNOSIS:  Same  PROCEDURE: Right IJ tunneled hemodialysis catheter  SURGEON:  Curt Jews, M.D.  PHYSICIAN ASSISTANT: Nurse  ANESTHESIA: Local  EBL: per anesthesia record  No intake/output data recorded.  BLOOD ADMINISTERED: none  DRAINS: none  SPECIMEN: none  COUNTS CORRECT:  YES  PATIENT DISPOSITION:  PACU - hemodynamically stable  PROCEDURE DETAILS: Patient was taken to room placed supine position where the area of the right and left neck and chest prepped draped in usual sterile fashion.  SonoSite ultrasound revealed widely patent internal jugular vein.  A 18-gauge needle was used to access the right internal jugular vein under direct visualization.  A guidewire was passed centrally down to the level the right atrium this was confirmed with fluoroscopy.  Dilator and peel-away sheath was passed over the guidewire and the dilator and guidewire were removed.  The 23 cm catheter was positioned through the sheath and the sheath was removed.  Catheter was brought through the skin and a separate stab incision.  2 lm ports were attached and both lumens flushed and aspirated equally.  The catheter secured to the skin with a 2-0 nylon stitch and the entry site was closed with a 4 subcuticular Vicryl stitch.  Both lumens were locked with 1000 unit/cc heparin.  Catheter was transferred to the recovery room with chest x-rays pending   Rosetta Posner, M.D., [facility] 05/15/2019 1:11 PM

## 2019-05-21 ENCOUNTER — Other Ambulatory Visit (HOSPITAL_COMMUNITY)
Admission: RE | Admit: 2019-05-21 | Discharge: 2019-05-21 | Disposition: A | Discharge status: Home / Self Care | Payer: 59 | Source: Ambulatory Visit | Attending: Urology | Admitting: Urology

## 2019-05-21 ENCOUNTER — Telehealth: Payer: Self-pay

## 2019-05-21 DIAGNOSIS — Z20828 Contact with and (suspected) exposure to other viral communicable diseases: Secondary | ICD-10-CM | POA: Insufficient documentation

## 2019-05-21 DIAGNOSIS — Z01812 Encounter for preprocedural laboratory examination: Secondary | ICD-10-CM | POA: Insufficient documentation

## 2019-05-21 NOTE — Telephone Encounter (Signed)
Pt advised he will call back to set up appt for hep b and pne vacicne due to him having surgery soon. Devon Cook

## 2019-05-21 NOTE — Telephone Encounter (Signed)
Pt. Wife called and stating her husband recently started dialysis like 2 to 3 weeks ago, they have been wanting him to get the Hep B series done and a pneumonia shot, but she wanted to check with [place] first and see if he needs these and if he can get them here. Please advise.

## 2019-05-21 NOTE — Telephone Encounter (Signed)
Go ahead and schedule this.

## 2019-05-22 ENCOUNTER — Encounter (HOSPITAL_BASED_OUTPATIENT_CLINIC_OR_DEPARTMENT_OTHER): Payer: Self-pay | Admitting: *Deleted

## 2019-05-22 ENCOUNTER — Other Ambulatory Visit: Payer: Self-pay

## 2019-05-22 LAB — NOVEL CORONAVIRUS, NAA (HOSP ORDER, SEND-OUT TO REF LAB; TAT 18-24 HRS): SARS-CoV-2, NAA: NOT DETECTED

## 2019-05-22 NOTE — Progress Notes (Signed)
Per pt he is scheduled for sleep study next week by his pcp. Due to this stop-bang results not sent to pcp.

## 2019-05-22 NOTE — Progress Notes (Signed)
   05/22/19 1712  OBSTRUCTIVE SLEEP APNEA  Have you ever been diagnosed with sleep apnea through a sleep study? No  Do you snore loudly (loud enough to be heard through closed doors)?  1  Do you often feel tired, fatigued, or sleepy during the daytime (such as falling asleep during driving or talking to someone)? 0  Has anyone observed you stop breathing during your sleep? 1  Do you have, or are you being treated for high blood pressure? 1  BMI more than 35 kg/m2? 1  Age > 42 (1-yes) 1  Male Gender (Yes=1) 1  Obstructive Sleep Apnea Score 6

## 2019-05-22 NOTE — Progress Notes (Addendum)
ADDENDUM:  Chart reviewed by anesthesia, Konrad Felix PA,  Ok to proceed.   Spoke w/ via phone for pre-op interview--- PT Lab needs dos----  Istat 8             Lab results------ Current ekg in chart/ epic COVID test ------  05-21-2019 Arrive at -------  0530 NPO after ------  [place] Medications to take morning of surgery ----- Protonix w/ sips of water Diabetic medication ----- n/a Patient Special Instructions ----- n/a Pre-Op special Istructions ----- n/a  Patient verbalized understanding of instructions that were given at this phone interview. Patient denies shortness of breath, chest pain, fever, cough a this phone interview.   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  Dialysis @ [facility]) , T/TH/S.  Pt started dialysis first week of Nov 2020, due to bilateral lower extremitiy edema.  PCP:  Dr Jill Alexanders Cardiologist : no Nephrologist:  Dr Lorrene Reid  Chest x-ray : 1V 05-06-2019 epic EKG :  03-15-2019 epic Echo : no Stress test:  no Cardiac Cath :  no Sleep Study :  Per pt is scheduled for sleep study next week by his pcp  Blood Thinner/ Instructions /Last Dose: NO ASA / Instructions/ Last Dose : ASA 81mg /  Per pt given instructions to stop 2 days prior /  Last dose 05-22-2019

## 2019-05-23 ENCOUNTER — Other Ambulatory Visit: Payer: Self-pay | Admitting: Urology

## 2019-05-23 NOTE — Anesthesia Preprocedure Evaluation (Addendum)
Anesthesia Evaluation  Patient identified by MRN, date of birth, ID band Patient awake    Reviewed: Allergy & Precautions, NPO status , Patient's Chart, lab work & pertinent test results  Airway Mallampati: II  TM Distance: >3 FB Neck ROM: Full    Dental no notable dental hx.    Pulmonary neg pulmonary ROS, Current Smoker and Patient abstained from smoking.,    Pulmonary exam normal breath sounds clear to auscultation       Cardiovascular hypertension, Normal cardiovascular exam Rhythm:Regular Rate:Normal     Neuro/Psych negative neurological ROS  negative psych ROS   GI/Hepatic Neg liver ROS, GERD  ,  Endo/Other  negative endocrine ROS  Renal/GU DialysisRenal disease  negative genitourinary   Musculoskeletal negative musculoskeletal ROS (+)   Abdominal   Peds negative pediatric ROS (+)  Hematology negative hematology ROS (+)   Anesthesia Other Findings   Reproductive/Obstetrics negative OB ROS                            Anesthesia Physical Anesthesia Plan  ASA: III  Anesthesia Plan: MAC   Post-op Pain Management:    Induction: Intravenous  PONV Risk Score and Plan:   Airway Management Planned: Simple Face Mask  Additional Equipment:   Intra-op Plan:   Post-operative Plan:   Informed Consent: I have reviewed the patients History and Physical, chart, labs and discussed the procedure including the risks, benefits and alternatives for the proposed anesthesia with the patient or authorized representative who has indicated his/her understanding and acceptance.     Dental advisory given  Plan Discussed with: CRNA and Surgeon  Anesthesia Plan Comments:         Anesthesia Quick Evaluation

## 2019-05-24 ENCOUNTER — Encounter (HOSPITAL_BASED_OUTPATIENT_CLINIC_OR_DEPARTMENT_OTHER)
Admission: RE | Disposition: A | Discharge status: Home / Self Care | Payer: Self-pay | Source: Home / Self Care | Attending: Urology

## 2019-05-24 ENCOUNTER — Ambulatory Visit (HOSPITAL_BASED_OUTPATIENT_CLINIC_OR_DEPARTMENT_OTHER): Payer: 59 | Admitting: Physician Assistant

## 2019-05-24 ENCOUNTER — Encounter (HOSPITAL_BASED_OUTPATIENT_CLINIC_OR_DEPARTMENT_OTHER): Payer: Self-pay | Admitting: Emergency Medicine

## 2019-05-24 ENCOUNTER — Other Ambulatory Visit: Payer: Self-pay

## 2019-05-24 ENCOUNTER — Ambulatory Visit (HOSPITAL_BASED_OUTPATIENT_CLINIC_OR_DEPARTMENT_OTHER)
Admission: RE | Admit: 2019-05-24 | Discharge: 2019-05-24 | Disposition: A | Discharge status: Home / Self Care | Payer: 59 | Attending: Urology | Admitting: Urology

## 2019-05-24 DIAGNOSIS — K219 Gastro-esophageal reflux disease without esophagitis: Secondary | ICD-10-CM | POA: Insufficient documentation

## 2019-05-24 DIAGNOSIS — N2581 Secondary hyperparathyroidism of renal origin: Secondary | ICD-10-CM | POA: Insufficient documentation

## 2019-05-24 DIAGNOSIS — C7951 Secondary malignant neoplasm of bone: Secondary | ICD-10-CM | POA: Insufficient documentation

## 2019-05-24 DIAGNOSIS — Z79899 Other long term (current) drug therapy: Secondary | ICD-10-CM | POA: Diagnosis not present

## 2019-05-24 DIAGNOSIS — Z992 Dependence on renal dialysis: Secondary | ICD-10-CM | POA: Insufficient documentation

## 2019-05-24 DIAGNOSIS — N186 End stage renal disease: Secondary | ICD-10-CM | POA: Diagnosis not present

## 2019-05-24 DIAGNOSIS — N135 Crossing vessel and stricture of ureter without hydronephrosis: Secondary | ICD-10-CM | POA: Diagnosis not present

## 2019-05-24 DIAGNOSIS — D631 Anemia in chronic kidney disease: Secondary | ICD-10-CM | POA: Diagnosis not present

## 2019-05-24 DIAGNOSIS — M109 Gout, unspecified: Secondary | ICD-10-CM | POA: Insufficient documentation

## 2019-05-24 DIAGNOSIS — I12 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 stage renal disease: Secondary | ICD-10-CM | POA: Diagnosis not present

## 2019-05-24 DIAGNOSIS — C61 Malignant neoplasm of prostate: Secondary | ICD-10-CM | POA: Insufficient documentation

## 2019-05-24 DIAGNOSIS — Z7982 Long term (current) use of aspirin: Secondary | ICD-10-CM | POA: Diagnosis not present

## 2019-05-24 DIAGNOSIS — F17210 Nicotine dependence, cigarettes, uncomplicated: Secondary | ICD-10-CM | POA: Insufficient documentation

## 2019-05-24 HISTORY — DX: Other specified postprocedural states: Z98.890

## 2019-05-24 HISTORY — DX: End stage renal disease: N18.6

## 2019-05-24 HISTORY — PX: CYSTOSCOPY W/ URETERAL STENT PLACEMENT: SHX1429

## 2019-05-24 HISTORY — DX: Localized edema: R60.0

## 2019-05-24 HISTORY — DX: End stage renal disease: Z99.2

## 2019-05-24 SURGERY — CYSTOSCOPY, WITH RETROGRADE PYELOGRAM AND URETERAL STENT INSERTION
Anesthesia: General | Site: Pelvis | Laterality: Bilateral

## 2019-05-24 MED ORDER — PHENYLEPHRINE 40 MCG/ML (10ML) SYRINGE FOR IV PUSH (FOR BLOOD PRESSURE SUPPORT)
PREFILLED_SYRINGE | INTRAVENOUS | Status: AC
  Filled 2019-05-24: qty 10

## 2019-05-24 MED ORDER — FENTANYL CITRATE (PF) 100 MCG/2ML IJ SOLN
INTRAMUSCULAR | Status: DC | PRN
  Administered 2019-05-24: 50 ug via INTRAVENOUS

## 2019-05-24 MED ORDER — SODIUM CHLORIDE 0.9 % IR SOLN
Status: DC | PRN
  Administered 2019-05-24: 3000 mL

## 2019-05-24 MED ORDER — PROPOFOL 10 MG/ML IV BOLUS
INTRAVENOUS | Status: DC | PRN
  Administered 2019-05-24: 200 mg via INTRAVENOUS

## 2019-05-24 MED ORDER — OXYCODONE-ACETAMINOPHEN 5-325 MG PO TABS: 1.0000 | ORAL_TABLET | Freq: Three times a day (TID) | ORAL | 0 refills | Status: DC | PRN

## 2019-05-24 MED ORDER — PROPOFOL 500 MG/50ML IV EMUL
INTRAVENOUS | Status: AC
  Filled 2019-05-24: qty 50

## 2019-05-24 MED ORDER — SODIUM CHLORIDE 0.9 % IV SOLN
1.0000 g | Freq: Once | INTRAVENOUS | Status: AC
  Administered 2019-05-24: 2 g via INTRAVENOUS
  Filled 2019-05-24: qty 10

## 2019-05-24 MED ORDER — FENTANYL CITRATE (PF) 100 MCG/2ML IJ SOLN
INTRAMUSCULAR | Status: AC
  Filled 2019-05-24: qty 2

## 2019-05-24 MED ORDER — ONDANSETRON HCL 4 MG/2ML IJ SOLN
INTRAMUSCULAR | Status: DC | PRN
  Administered 2019-05-24: 4 mg via INTRAVENOUS

## 2019-05-24 MED ORDER — SODIUM CHLORIDE 0.9 % IV SOLN
INTRAVENOUS | Status: AC
  Filled 2019-05-24: qty 100

## 2019-05-24 MED ORDER — DEXAMETHASONE SODIUM PHOSPHATE 10 MG/ML IJ SOLN
INTRAMUSCULAR | Status: AC
  Filled 2019-05-24: qty 1

## 2019-05-24 MED ORDER — SODIUM CHLORIDE 0.9 % IV SOLN
INTRAVENOUS | Status: DC
  Administered 2019-05-24: 07:00:00 via INTRAVENOUS
  Filled 2019-05-24: qty 1000

## 2019-05-24 MED ORDER — PHENYLEPHRINE 40 MCG/ML (10ML) SYRINGE FOR IV PUSH (FOR BLOOD PRESSURE SUPPORT)
PREFILLED_SYRINGE | INTRAVENOUS | Status: DC | PRN
  Administered 2019-05-24: 100 ug via INTRAVENOUS
  Administered 2019-05-24 (×2): 160 ug via INTRAVENOUS

## 2019-05-24 MED ORDER — CEFTRIAXONE SODIUM 1 G IJ SOLR
INTRAMUSCULAR | Status: AC
  Filled 2019-05-24: qty 10

## 2019-05-24 MED ORDER — ARTIFICIAL TEARS OPHTHALMIC OINT
TOPICAL_OINTMENT | OPHTHALMIC | Status: AC
  Filled 2019-05-24: qty 3.5

## 2019-05-24 MED ORDER — EPHEDRINE SULFATE-NACL 50-0.9 MG/10ML-% IV SOSY
PREFILLED_SYRINGE | INTRAVENOUS | Status: DC | PRN
  Administered 2019-05-24: 10 mg via INTRAVENOUS

## 2019-05-24 MED ORDER — DEXAMETHASONE SODIUM PHOSPHATE 4 MG/ML IJ SOLN
INTRAMUSCULAR | Status: DC | PRN
  Administered 2019-05-24: 8 mg via INTRAVENOUS

## 2019-05-24 MED ORDER — PROPOFOL 10 MG/ML IV BOLUS
INTRAVENOUS | Status: AC
  Filled 2019-05-24: qty 40

## 2019-05-24 MED ORDER — MIDAZOLAM HCL 5 MG/5ML IJ SOLN
INTRAMUSCULAR | Status: DC | PRN
  Administered 2019-05-24: 2 mg via INTRAVENOUS

## 2019-05-24 MED ORDER — EPHEDRINE 5 MG/ML INJ
INTRAVENOUS | Status: AC
  Filled 2019-05-24: qty 10

## 2019-05-24 MED ORDER — SODIUM CHLORIDE 0.9 % IV SOLN
1.0000 g | INTRAVENOUS | Status: DC
  Filled 2019-05-24: qty 10

## 2019-05-24 MED ORDER — EPHEDRINE SULFATE 50 MG/ML IJ SOLN
INTRAMUSCULAR | Status: DC | PRN
  Administered 2019-05-24: 10 mg via INTRAVENOUS

## 2019-05-24 MED ORDER — IOHEXOL 300 MG/ML  SOLN
INTRAMUSCULAR | Status: DC | PRN
  Administered 2019-05-24: 15 mL via URETHRAL

## 2019-05-24 MED ORDER — LIDOCAINE 2% (20 MG/ML) 5 ML SYRINGE
INTRAMUSCULAR | Status: AC
  Filled 2019-05-24: qty 5

## 2019-05-24 MED ORDER — PROPOFOL 500 MG/50ML IV EMUL
INTRAVENOUS | Status: DC | PRN
  Administered 2019-05-24: 200 ug/kg/min via INTRAVENOUS

## 2019-05-24 MED ORDER — ONDANSETRON HCL 4 MG/2ML IJ SOLN
INTRAMUSCULAR | Status: AC
  Filled 2019-05-24: qty 2

## 2019-05-24 MED ORDER — LIDOCAINE 2% (20 MG/ML) 5 ML SYRINGE
INTRAMUSCULAR | Status: DC | PRN
  Administered 2019-05-24: 100 mg via INTRAVENOUS

## 2019-05-24 MED ORDER — MIDAZOLAM HCL 2 MG/2ML IJ SOLN
INTRAMUSCULAR | Status: AC
  Filled 2019-05-24: qty 2

## 2019-05-24 SURGICAL SUPPLY — 21 items
BAG DRAIN URO-CYSTO SKYTR STRL (DRAIN) ×3 IMPLANT
BASKET LASER NITINOL 1.9FR (BASKET) IMPLANT
CATH INTERMIT  6FR 70CM (CATHETERS) ×2 IMPLANT
CLOTH BEACON ORANGE TIMEOUT ST (SAFETY) ×3 IMPLANT
FIBER LASER FLEXIVA 365 (UROLOGICAL SUPPLIES) IMPLANT
FIBER LASER TRAC TIP (UROLOGICAL SUPPLIES) IMPLANT
GLOVE BIO SURGEON STRL SZ7.5 (GLOVE) ×5 IMPLANT
GOWN STRL REUS W/TWL LRG LVL3 (GOWN DISPOSABLE) ×3 IMPLANT
GUIDEWIRE ANG ZIPWIRE 038X150 (WIRE) ×3 IMPLANT
GUIDEWIRE STR DUAL SENSOR (WIRE) ×3 IMPLANT
IV NS IRRIG 3000ML ARTHROMATIC (IV SOLUTION) ×3 IMPLANT
KIT TURNOVER CYSTO (KITS) ×3 IMPLANT
MANIFOLD NEPTUNE II (INSTRUMENTS) ×3 IMPLANT
NS IRRIG 500ML POUR BTL (IV SOLUTION) ×4 IMPLANT
PACK CYSTO (CUSTOM PROCEDURE TRAY) ×3 IMPLANT
STENT CONTOUR 7FRX26 (STENTS) ×4 IMPLANT
SYR 10ML LL (SYRINGE) ×3 IMPLANT
TUBE CONNECTING 12'X1/4 (SUCTIONS) ×1
TUBE CONNECTING 12X1/4 (SUCTIONS) ×1 IMPLANT
TUBE FEEDING 8FR 16IN STR KANG (MISCELLANEOUS) IMPLANT
TUBING UROLOGY SET (TUBING) ×2 IMPLANT

## 2019-05-24 NOTE — Progress Notes (Signed)
Pt has an open wound to the left elbow from home. Old dressing in place.  Dressing changed. Pt instructed to keep close eye on the wound.

## 2019-05-24 NOTE — Anesthesia Postprocedure Evaluation (Signed)
Anesthesia Post Note  Patient: Devon Cook  Procedure(s) Performed: CYSTOSCOPY WITH RETROGRADE PYELOGRAM/BILATERAL URETERAL STENT EXCHANGE (Bilateral Pelvis)     Patient location during evaluation: PACU Anesthesia Type: General Level of consciousness: awake and alert Pain management: pain level controlled Vital Signs Assessment: post-procedure vital signs reviewed and stable Respiratory status: spontaneous breathing, nonlabored ventilation, respiratory function stable and patient connected to nasal cannula oxygen Cardiovascular status: blood pressure returned to baseline and stable Postop Assessment: no apparent nausea or vomiting Anesthetic complications: no    Last Vitals:  Vitals:   05/24/19 0830 05/24/19 0845  BP: 121/69 125/76  Pulse: 86 85  Resp: (!) 21 (!) 21  Temp:    SpO2: 95% 91%    Last Pain:  Vitals:   05/24/19 0818  TempSrc:   PainSc: 0-No pain                 Melik Blancett S

## 2019-05-24 NOTE — Transfer of Care (Signed)
    Last Vitals:  Vitals Value Taken Time  BP 106/69 05/24/19 0818  Temp 36.8 C 05/24/19 0818  Pulse 92 05/24/19 0821  Resp 21 05/24/19 0821  SpO2 95 % 05/24/19 0821  Vitals shown include unvalidated device data.  Last Pain:  Vitals:   05/24/19 0646  TempSrc: Oral  PainSc: 0-No pain      Patients Stated Pain Goal: 3 (05/24/19 8335)  Immediate Anesthesia Transfer of Care Note  Patient: Devon Cook  Procedure(s) Performed: Procedure(s) (LRB): CYSTOSCOPY WITH RETROGRADE PYELOGRAM/BILATERAL URETERAL STENT EXCHANGE (Bilateral)  Patient Location: PACU  Anesthesia Type: General  Level of Consciousness: awake, alert  and oriented  Airway & Oxygen Therapy: Patient Spontanous Breathing and Patient connected to nasal cannula oxygen  Post-op Assessment: Report given to PACU RN and Post -op Vital signs reviewed and stable  Post vital signs: Reviewed and stable  Complications: No apparent anesthesia complications

## 2019-05-24 NOTE — Brief Op Note (Signed)
05/24/2019  8:01 AM  PATIENT:  Devon Geralds  58 y.o. male  PRE-OPERATIVE DIAGNOSIS:  MALIGNANT URETERAL OBSTRUCTION  POST-OPERATIVE DIAGNOSIS:  MALIGNANT URETERAL OBSTRUCTION  PROCEDURE:  Procedure(s): CYSTOSCOPY WITH RETROGRADE PYELOGRAM/BILATERAL URETERAL STENT EXCHANGE (Bilateral)  SURGEON:  Surgeon(s) and Role:    * Alexis Frock, MD - Primary  PHYSICIAN ASSISTANT:   ASSISTANTS: none   ANESTHESIA:   general  EBL:  minmal   BLOOD ADMINISTERED:none  DRAINS: none   LOCAL MEDICATIONS USED:  NONE  SPECIMEN:  Source of Specimen:  bialteral stents  DISPOSITION OF SPECIMEN:  discard  COUNTS:  YES  TOURNIQUET:  * No tourniquets in log *  DICTATION: .Other Dictation: Dictation Number [id number]  PLAN OF CARE: Discharge to home after PACU  PATIENT DISPOSITION:  PACU - hemodynamically stable.   Delay start of Pharmacological VTE agent (>24hrs) due to surgical blood loss or risk of bleeding: yes

## 2019-05-24 NOTE — H&P (Signed)
Devon Cook is an 58 y.o. male.    Chief Complaint: Pre-OP BILATERAL Stent Change, Retrogrades  HPI:   1 - Prostate Cancer-  Initial Diagnosis - Gl9 metastatic cancer 12/2017 on eval PSA 787.  Primary Therapy - androgen deprivation.  Prior Treatments - androgen deprivation.  Current Treatment - androgen deprivation.  Most Recent Restaging - 11/2017 CT, BS non-bulky pelvic nodes, L3 met   Recent Course -  08/2018 OR Exchange bilat stents (7x26), Cr 6.2, much improved bladder neck / prostate nodular tissue  01/2019 - OR Exchange bilat stents (7x26), PSA 0.016 / T<10 ==> Lupron 45; 04/2019 - PSA 0.02 / T 21, Cr 8.5   2 - Bone Health / Bone Mets - L3 spine mets w/o compression  Prior DEXA - 04/2019 T -0.4 (normal)  Most Recent labs - 04/2019 Ca normal, Vit D 29 (low)  Current Treatment - none    3 - Malignant Ureteral Obstruction - Cr 17 by PCP labs on eval malaize 11/2017. Non-con CT with hydro to level of thickened bladder. Bilateral neph tubes placed 11/2017 and converted to bilateral 7x26 stents 12/2017 in OR. Now established with Devon Cook kidney with GFR 15 range, requires aranesp and PRN transfusion.    4- Chronic Renal Insufficiency - Cr 4-8's after bilateral renal decompression c/w permanent renal damage. Now established with Dr. Lorrene Cook at [facility].   PMH sig for pre-DM, obesity, L spine arthritis. NO isschemic CV disease / blood thinners.   Today "Devon Cook" is see to proceed with bilateral stent change. He is now on dialysis but makes urine. No interval fevers. C19 screen negative. Most recent UA without significant infectious parameters.   Past Medical History:  Diagnosis Date  . Anemia, chronic renal failure    followed by dr Devon Cook--- dx 05/ 2019---  treated with Iron infusions and procrit  . Bilateral hydronephrosis    malignant-- chronic  . ED (erectile dysfunction)   . Edema of both lower extremities    05-22-2019  per pt started 3 weeks ago, pt is ESRD,   hemodialysis was started  . End stage renal failure on dialysis [facility]) hemodialysis T/TH/S @ [facility]---started first week of Nov 2020   nephrologist-- dr c. Devon Cook-- secondary to obstructive uropathy secondary to prostate cancer  . GERD (gastroesophageal reflux disease)   . Gout    05-22-2019  per pt unsure when last episode   . Herpes genitalis in men   . History of hepatitis C 2017   dx 11/ 207-- treated with Harvoni (in epic 04/ 2019 lab result non-detectable)  . History of lower GI bleeding 11/2017  . Hypertension   . Metabolic acidosis   . Prostate 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 [facility]) urologist-- dr Devon Cook   dx 06/ 2019-- advanced w/ mets to bone and pelvic lymphadeopathy  . S/P arteriovenous (AV) fistula creation    03-15-2019  by dr Carlis Abbott, left branhiocephallic  . Secondary hyperparathyroidism of renal origin ([place])   . Wears glasses     Past Surgical History:  Procedure Laterality Date  . AV FISTULA PLACEMENT Left 03/15/2019   Procedure: ARTERIOVENOUS (AV) FISTULA CREATION LEFT ARM;  Surgeon: Marty Heck, MD;  Location: [place];  Service: Vascular;  Laterality: Left;  . CYSTOSCOPY W/ URETERAL STENT PLACEMENT Bilateral 12/08/2017   Procedure: CYSTOSCOPY WITH RETROGRADE PYELOGRAM/URETERAL STENT PLACEMENT;  Surgeon: Alexis Frock, MD;  Location: [facility];  Service: Urology;  Laterality: Bilateral;  . CYSTOSCOPY W/ URETERAL STENT PLACEMENT Bilateral 04/20/2018  Procedure: CYSTOSCOPY WITH STENT REPLACEMENT;  Surgeon: Alexis Frock, MD;  Location: [facility];  Service: Urology;  Laterality: Bilateral;  . CYSTOSCOPY W/ URETERAL STENT PLACEMENT Bilateral 08/10/2018   Procedure: CYSTOSCOPY WITH STENT REPLACEMENT, BILATERAL RETROGRADES;  Surgeon: Alexis Frock, MD;  Location: [facility];  Service: Urology;  Laterality: Bilateral;  45 MINS  . CYSTOSCOPY W/ URETERAL STENT PLACEMENT Bilateral 01/18/2019   Procedure: CYSTOSCOPY WITH  RETROGRADE PYELOGRAM/URETERAL STENT PLACEMENT;  Surgeon: Alexis Frock, MD;  Location: [facility];  Service: Urology;  Laterality: Bilateral;  . INSERTION OF DIALYSIS CATHETER Right 05/06/2019   Procedure: INSERTION OF TUNNELED  DIALYSIS CATHETER;  Surgeon: Rosetta Posner, MD;  Location: [place];  Service: Vascular;  Laterality: Right;  . IR NEPHROSTOMY PLACEMENT LEFT  11/15/2017  . IR NEPHROSTOMY PLACEMENT RIGHT  11/15/2017  . PROSTATE BIOPSY N/A 12/08/2017   Procedure: BIOPSY TRANSRECTAL ULTRASONIC PROSTATE (TUBP), PERINEAL BIOPSY;  Surgeon: Alexis Frock, MD;  Location: [facility];  Service: Urology;  Laterality: N/A;  . TRANSURETHRAL RESECTION OF BLADDER TUMOR N/A 12/08/2017   Procedure: TRANSURETHRAL RESECTION OF BLADDER TUMOR (TURBT);  Surgeon: Alexis Frock, MD;  Location: [facility];  Service: Urology;  Laterality: N/A;  . TRICEPS TENDON REPAIR Bilateral 08/2005    Family History  Problem Relation Age of Onset  . Hypertension Mother    Social History:  reports that he has been smoking cigarettes, pipe, and cigars. He has a 3.00 pack-year smoking history. He has never used smokeless tobacco. He reports previous alcohol use. He reports that he does not use drugs.  Allergies: No Known Allergies  No medications prior to admission.    No results found for this or any previous visit (from the past 48 hour(s)). No results found.  Review of Systems  Constitutional: Negative for chills and fever.  All other systems reviewed and are negative.   Height _0  (1.88 m), weight 131.5 kg. Physical Exam  Constitutional: He appears well-developed.  HENT:  Head: Normocephalic.  Eyes: Pupils are equal, round, and reactive to light.  Neck: Normal range of motion.  Cardiovascular: Normal rate.  Respiratory: Effort normal.  GI: Soft.  Genitourinary:    Genitourinary Comments: No CVAT   Musculoskeletal: Normal range of motion.  Neurological: He is alert.  Skin: Skin is warm.   Psychiatric: He has a normal mood and affect.     Assessment/Plan  Proceed as planned with cysto, bilateral retrogrades, bilateral stent change. Goal is to keep kidneys unobstructed enough to prevent infections and allow for continued urine output. Risks, benefits, alternatives, expected peri-op course discussed previously and reiterated today.   Alexis Frock, MD 05/24/2019, 5:26 AM

## 2019-05-24 NOTE — Anesthesia Procedure Notes (Signed)
Procedure Name: LMA Insertion Date/Time: 05/24/2019 7:44 AM Performed by: Mechele Claude, CRNA Pre-anesthesia Checklist: Patient identified, Emergency Drugs available, Suction available and Patient being monitored Patient Re-evaluated:Patient Re-evaluated prior to induction Oxygen Delivery Method: Circle system utilized Preoxygenation: Pre-oxygenation with 100% oxygen Induction Type: IV induction Ventilation: Mask ventilation without difficulty LMA: LMA inserted LMA Size: 5.0 Number of attempts: 1 Airway Equipment and Method: Bite block Placement Confirmation: positive ETCO2 Tube secured with: Tape Dental Injury: Teeth and Oropharynx as per pre-operative assessment

## 2019-05-24 NOTE — Op Note (Signed)
NAME: Devon Cook, Devon Cook MEDICAL RECORD [id number] ACCOUNT [id number] DATE OF BIRTH:September 13, 1960 FACILITY: [facility] LOCATION: [facility] PHYSICIAN:Demarrius Guerrero Tresa Moore, MD  OPERATIVE REPORT  DATE OF PROCEDURE:  05/24/2019  PREOPERATIVE DIAGNOSIS:  Bilateral malignant obstruction metastatic prostate cancer.  PROCEDURE: 1.  Cystoscopy, bilateral pyelograms, interpretation.  Exchange of bilateral ureteral stents 7 x 26 Contour, no tether.    ESTIMATED BLOOD LOSS:  Nil.  MEDICATIONS:  None.  SPECIMEN:  Bilateral stents for discard.  INDICATIONS:  The patient is a very pleasant but unfortunate 57 year old male with history of metastatic prostate cancer.  He is on androgen deprivation.  He also has a history of progressive stage IV renal disease, now on hemodialysis.  He does still  make some urine.  He has been stent dependent for over a year and a half and this does allow relief of his malignant obstruction, prevention of infection and he presents for stent change today.  Informed consent was obtained and placed in medical record.  DESCRIPTION OF PROCEDURE:  Patient being identified as patient, procedure being bilateral stent change was confirmed.  Procedure timeout was performed.  Antibiotics administered.  General LMA anesthesia induced.  The patient was placed into a low  lithotomy position, sterile field was created prepped and draped base of the penis, perineum and proximal thighs using iodine.  Cystourethroscopy was performed with a 21-French rigid cystoscope with offset lens.  Inspection of anterior and posterior is  unremarkable.  Inspection of bladder revealed distal end of bilateral ureteral stents in situ.  They were minimally encrusted.  His ureteral orifices were more visible today than on prior, suggesting good response to his cancer treatment.  Distal end of  the right stent was grasped, brought to the level of the urethral meatus.  A 0.03 ZIPwire was advanced to lower pole and set aside  as a safety wire.  Distal end of the right contralateral stent was grasped similarly brought to the urethral meatus.  A  separate ZIPwire was advanced.    Bilateral retrograde pyelograms revealed mild hydronephrosis to the level of the urinary bladder consistent with known malignant obstruction, mild at this point.  Next, a new 7 x 26 Contour stents were placed using cystoscopic and fluoroscopic guidance.   Good proximal and distal plane were noted.  Bladder was empty per cystoscope.  Procedure terminated.  The patient tolerated the procedure well.  No immediate complications.  The patient was taken to postanesthesia care in stable condition.  CN/NUANCE  D:05/24/2019 T:05/24/2019 JOB:[id number]

## 2019-05-24 NOTE — Progress Notes (Signed)
Bilateral stents removed per Dr. Tresa Moore at [phone] in  [place] #2.

## 2019-05-24 NOTE — Discharge Instructions (Signed)
1 - You may have urinary urgency (bladder spasms) and bloody urine on / off with stent in place. This is normal.  2 - Call MD or go to ER for fever >102, severe pain / nausea / vomiting not relieved by medications, or acute change in medical status  [facility] [phone] Post Ureteroscopy With  Stent Instructions  Definitions:  Ureter: The duct that transports urine from the kidney to the bladder. Stent:   A plastic hollow tube that is placed into the ureter, from the kidney to the bladder to prevent the ureter from swelling shut.  GENERAL INSTRUCTIONS:  Despite the fact that no skin incisions were used, the area around the ureter and bladder is raw and irritated. The stent is a foreign body which will further irritate the bladder wall. This irritation is manifested by increased frequency of urination, both day and night, and by an increase in the urge to urinate. In some, the urge to urinate is present almost always. Sometimes the urge is strong enough that you may not be able to stop yourself from urinating. The only real cure is to remove the stent and then give time for the bladder wall to heal which can't be done until the danger of the ureter swelling shut has passed, which varies.  You may see some blood in your urine while the stent is in place and a few days afterwards. Do not be alarmed, even if the urine was clear for a while. Get off your feet and drink lots of fluids until clearing occurs. If you start to pass clots or don't improve, call us.  DIET: You may return to your normal diet immediately. Because of the raw surface of your bladder, alcohol, spicy foods, acid type foods and drinks with caffeine may cause irritation or frequency and should be used in moderation. To keep your urine flowing freely and to avoid constipation, drink plenty of fluids during the day ( 8-10 glasses ). Tip: Avoid cranberry juice because it is very acidic.  ACTIVITY: Your physical  activity doesn't need to be restricted. However, if you are very active, you may see some blood in your urine. We suggest that you reduce your activity under these circumstances until the bleeding has stopped.  BOWELS: It is important to keep your bowels regular during the postoperative period. Straining with bowel movements can cause bleeding. A bowel movement every other day is reasonable. Use a mild laxative if needed, such as Milk of Magnesia 2-3 tablespoons, or 2 Dulcolax tablets. Call if you continue to have problems. If you have been taking narcotics for pain, before, during or after your surgery, you may be constipated. Take a laxative if necessary.   MEDICATION: You should resume your pre-surgery medications unless told not to. In addition you will often be given an antibiotic to prevent infection. These should be taken as prescribed until the bottles are finished unless you are having an unusual reaction to one of the drugs.  PROBLEMS YOU SHOULD REPORT TO US: Fevers over 100.5 Fahrenheit. Heavy bleeding, or clots ( See above notes about blood in urine ). Inability to urinate. Drug reactions ( hives, rash, nausea, vomiting, diarrhea ). Severe burning or pain with urination that is not improving.  FOLLOW-UP: You will need a follow-up appointment to monitor your progress. Call for this appointment at the number listed above. Usually the first appointment will be about three to fourteen days after your surgery.   Post Anesthesia Home Care Instructions    Activity: Get plenty of rest for the remainder of the day. A responsible individual must stay with you for 24 hours following the procedure.  For the next 24 hours, DO NOT: -Drive a car -Operate machinery -Drink alcoholic beverages -Take any medication unless instructed by your physician -Make any legal decisions or sign important papers.  Meals: Start with liquid foods such as gelatin or soup. Progress to regular foods as  tolerated. Avoid greasy, spicy, heavy foods. If nausea and/or vomiting occur, drink only clear liquids until the nausea and/or vomiting subsides. Call your physician if vomiting continues.  Special Instructions/Symptoms: Your throat may feel dry or sore from the anesthesia or the breathing tube placed in your throat during surgery. If this causes discomfort, gargle with warm salt water. The discomfort should disappear within 24 hours.          

## 2019-05-26 IMAGING — US IR NEPHROSTOMY PLACEMENT RIGHT
1 series · 2 of 2 positions shown · non-contrast
Comparison: CT abdomen pelvis - 11/14/2017

INDICATION: Concern for bladder cancer, now with obstructive bilateral uropathy.
Request made for placement of bilateral percutaneous nephrostomy
catheters for renal preservation purposes.

EXAM:
1. ULTRASOUND GUIDANCE FOR PUNCTURE OF THE BILATERAL RENAL
COLLECTING SYSTEMS
2. BILATERAL PERCUTANEOUS NEPHROSTOMY TUBE PLACEMENT.

[Series 1: ir nephrostomy placement right · 2 of 2 slices shown]
[im 1/2]
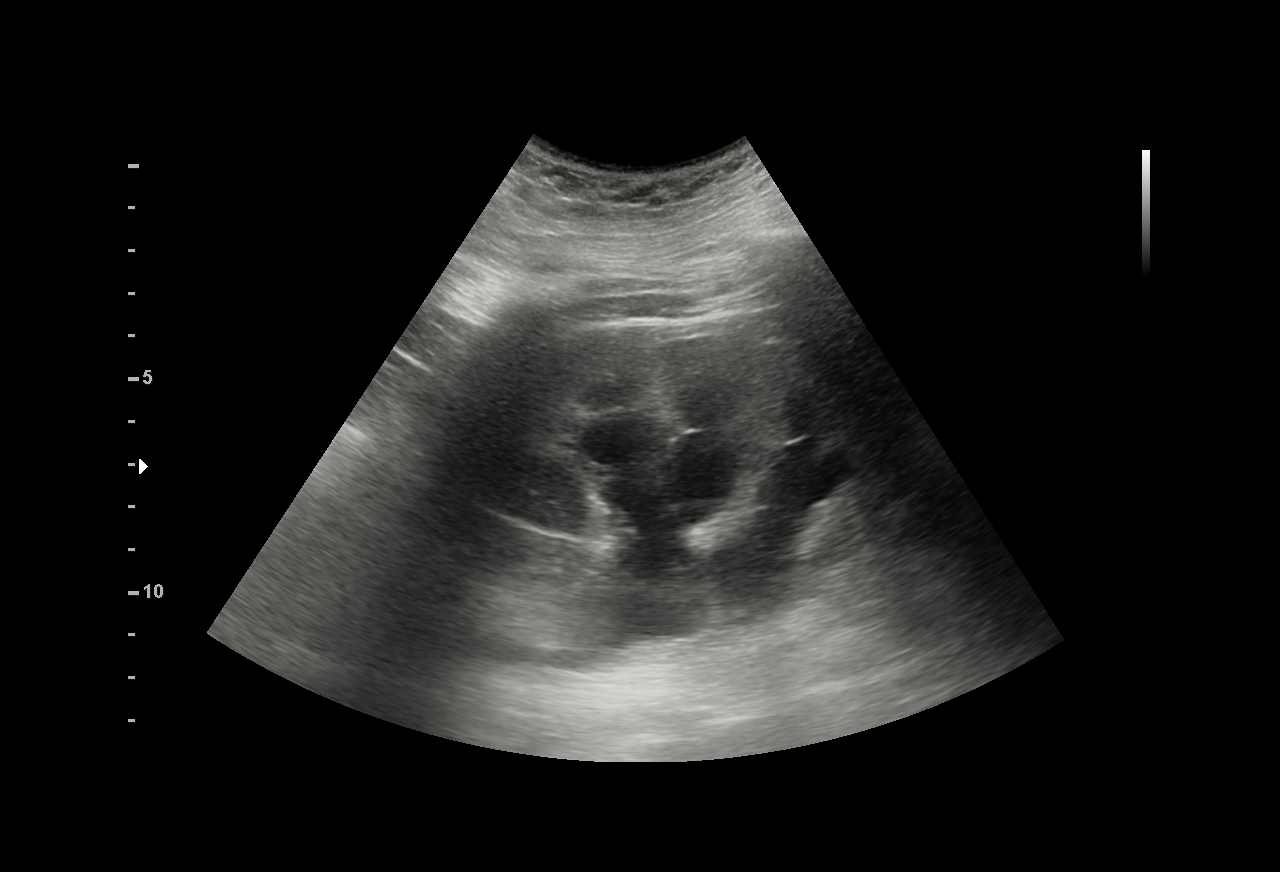
[im 2/2]
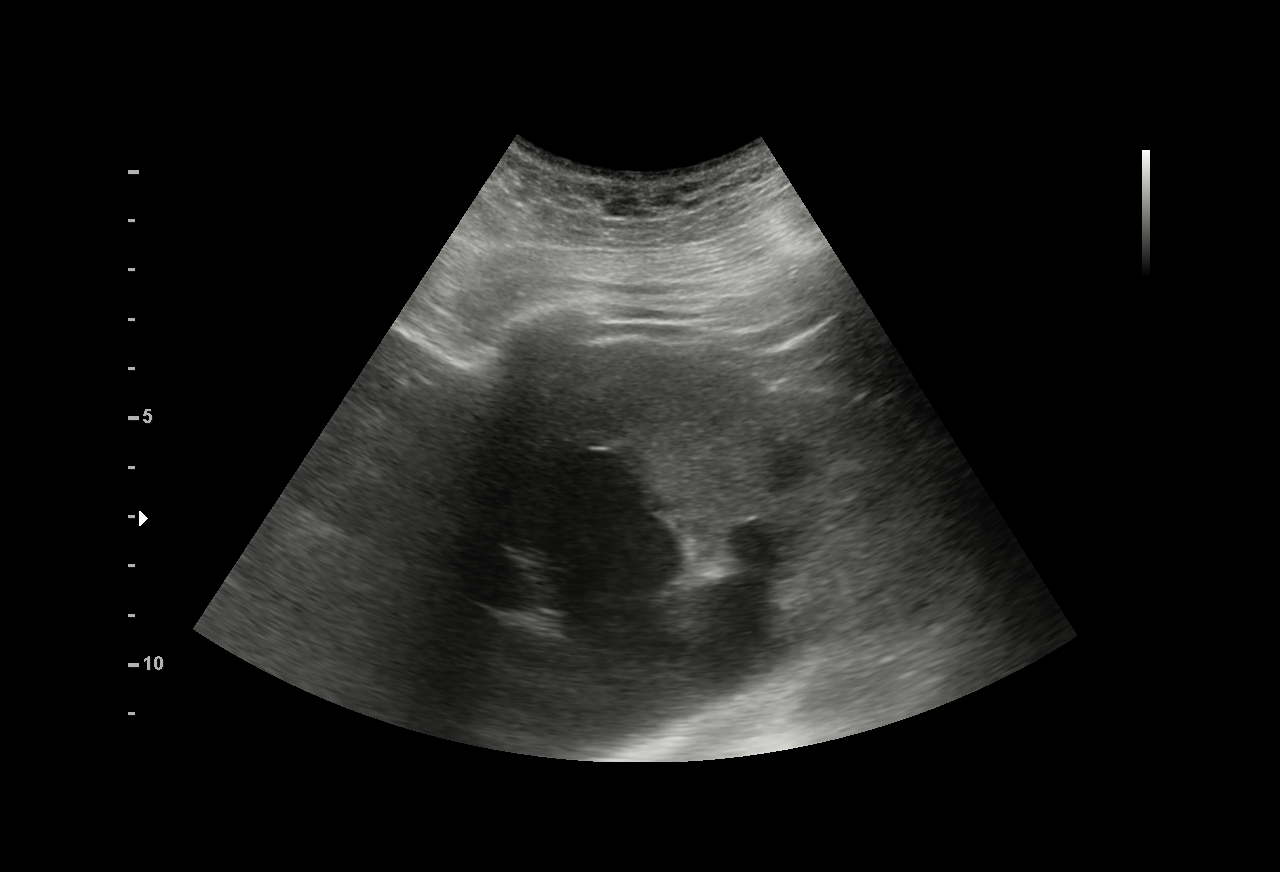

[2 of 2 positions shown; findings below may reference images not displayed]

MEDICATIONS:
Rocephin 2 g IV;; the antibiotic was administered in an appropriate
time frame prior to skin puncture.

ANESTHESIA/SEDATION:
Moderate (conscious) sedation was employed during this procedure. A
total of Versed 3 mg and Fentanyl 175 mcg was administered
intravenously.

Moderate Sedation Time: 37 minutes. The patient's level of
consciousness and vital signs were monitored continuously by
radiology nursing throughout the procedure under my direct
supervision.

CONTRAST:  A total of approximately 20 mL Isovue 300 was
administered into both renal collecting systems

FLUOROSCOPY TIME:  5 minutes, 18 seconds (259 mGy)

COMPLICATIONS:
None immediate.

PROCEDURE:
The procedure, risks, benefits, and alternatives were explained to
the patient. Questions regarding the procedure were encouraged and
answered. The patient understands and consents to the procedure. A
timeout was performed prior to the initiation of the procedure.

The bilateral flanks were prepped and draped in usual sterile
fashion with a sterile drape was applied covering the operative
field. A sterile gown and sterile gloves were used for the
procedure. Local anesthesia was provided with 1% Lidocaine with
epinephrine.

Ultrasound was used to localize the left kidney. Under direct
ultrasound guidance, a 21 gauge needle was advanced into the renal
collecting system. An ultrasound image documentation was performed.
Access within the collecting system was confirmed with the efflux of
urine followed by contrast injection.

Over a Nitrex wire, the tract was dilated with an Accustick stent.
Contrast injection confirmed appropriate positioning. Next, over a
short Amplatz wire and under intermittent fluoroscopic guidance, the
track was dilated allowing placement of a 10-French percutaneous
nephrostomy catheter with end coiled and locked within the left
renal pelvis. Contrast was injected and several sport radiographs
were obtained in various obliquities confirming access. The catheter
was secured at the skin with a Prolene retention suture and a
gravity bag was placed.

The identical procedure was then performed for the right kidney
ultimately allowing placement of a 10 French percutaneous
nephrostomy catheter via a right posterior inferior calyx with and
coiled and locked within right renal pelvis.

Both nephrostomy catheters were connected to gravity bags and
dressings were placed.

Patient tolerated the procedure well without immediate
postprocedural complication.
FINDINGS: Ultrasound scanning demonstrates a moderate to severely dilated
collecting systems bilaterally. Under direct ultrasound guidance,
posterior inferior calices were targeted bilaterally allowing
placement of 10-French percutaneous nephrostomy catheters under
intermittent fluoroscopic guidance. Bilateral percutaneous
nephrostomy catheters are coiled within the bilateral renal pelvis
ease. Contrast injection confirmed appropriate positioning.
IMPRESSION: Successful ultrasound and fluoroscopic guided placement of bilateral
10 French percutaneous nephrostomy catheters.

PLAN:
- Maintain bilateral nephrostomy catheters to gravity bags.
Recommend Q shift flushing with approximately 10 cc of saline until
urine is no longer blood tinged.

- Would allow for 2-3 weeks of track maturation prior to proceeding
with definitive bilateral nephrostograms and attempted placement of
antegrade ureteral stents.

## 2019-05-26 IMAGING — XA IR NEPHROSTOMY PLACEMENT RIGHT
6 series · 7 of 7 positions shown · non-contrast
Comparison: CT abdomen pelvis - 11/14/2017

INDICATION: Concern for bladder cancer, now with obstructive bilateral uropathy.
Request made for placement of bilateral percutaneous nephrostomy
catheters for renal preservation purposes.

EXAM:
1. ULTRASOUND GUIDANCE FOR PUNCTURE OF THE BILATERAL RENAL
COLLECTING SYSTEMS
2. BILATERAL PERCUTANEOUS NEPHROSTOMY TUBE PLACEMENT.

[Series 1: fl (-) angio · 1 of 1 slices shown (1 of 6)]
[im 1/1]
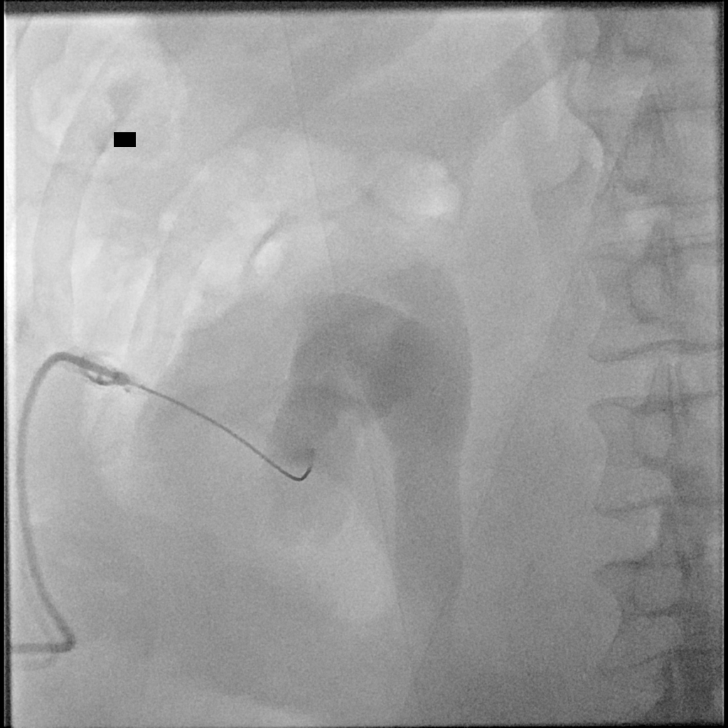

[Series 2: fl (-) angio · 1 of 1 slices shown (2 of 6)]
[im 1/1]
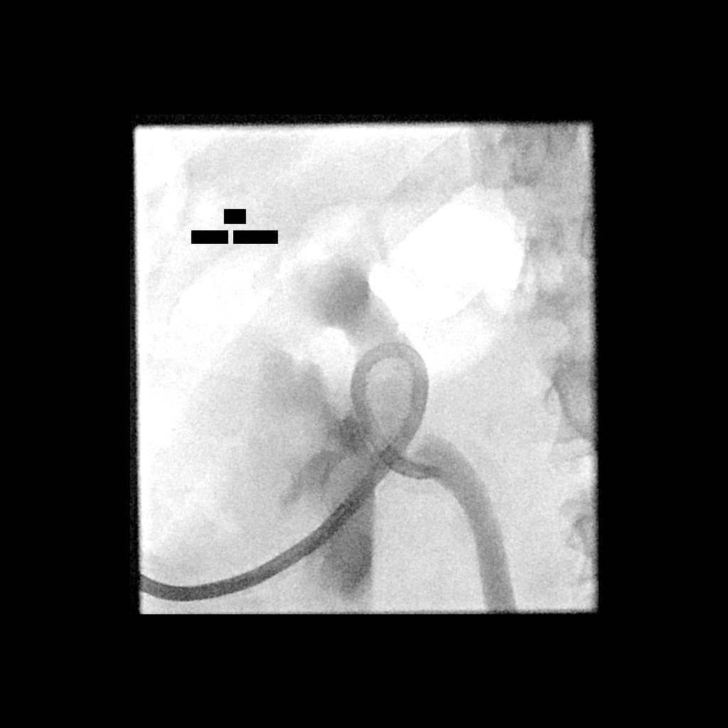

[Series 3: fl (-) angio · 1 of 1 slices shown (3 of 6)]
[im 1/1]
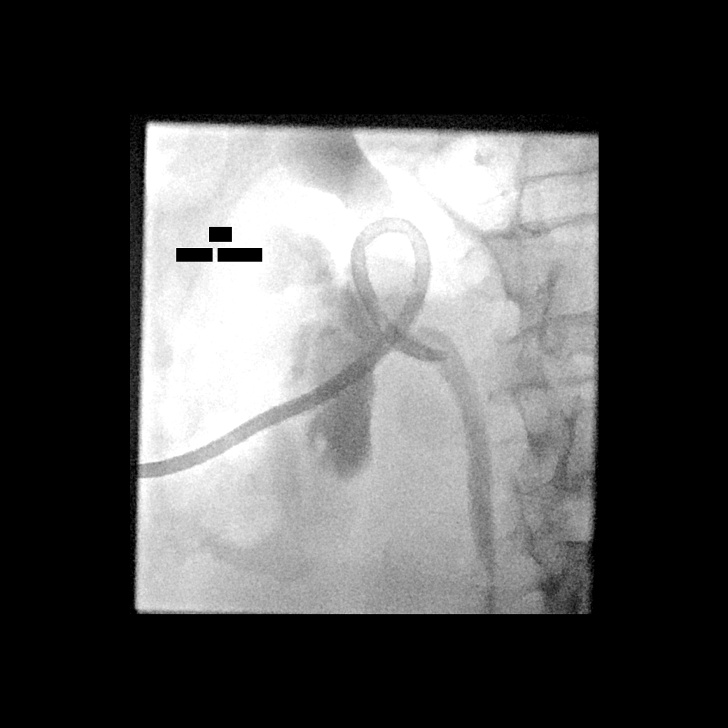

[Series 4: fl (-) angio · 1 of 1 slices shown (4 of 6)]
[im 1/1]
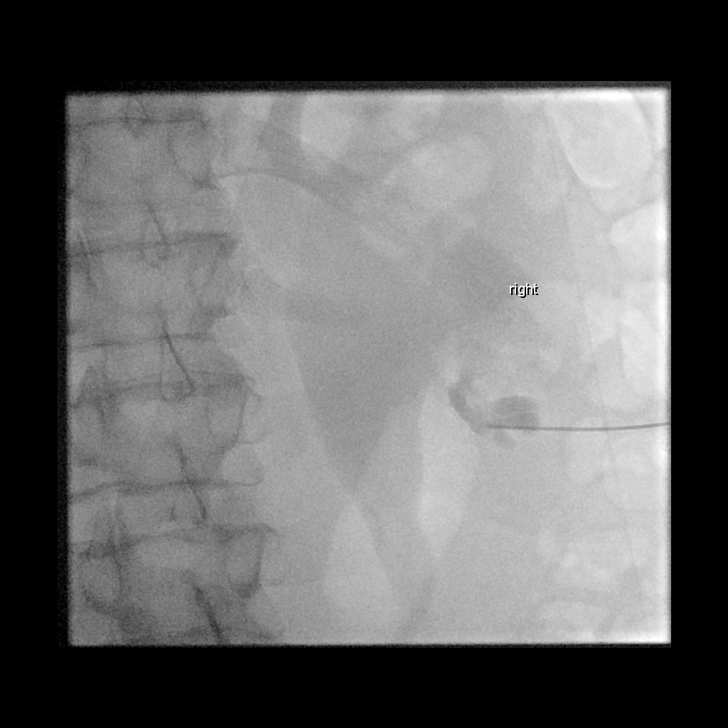

[Series 5: fl (-) angio · 1 of 1 slices shown (5 of 6)]
[im 1/1]
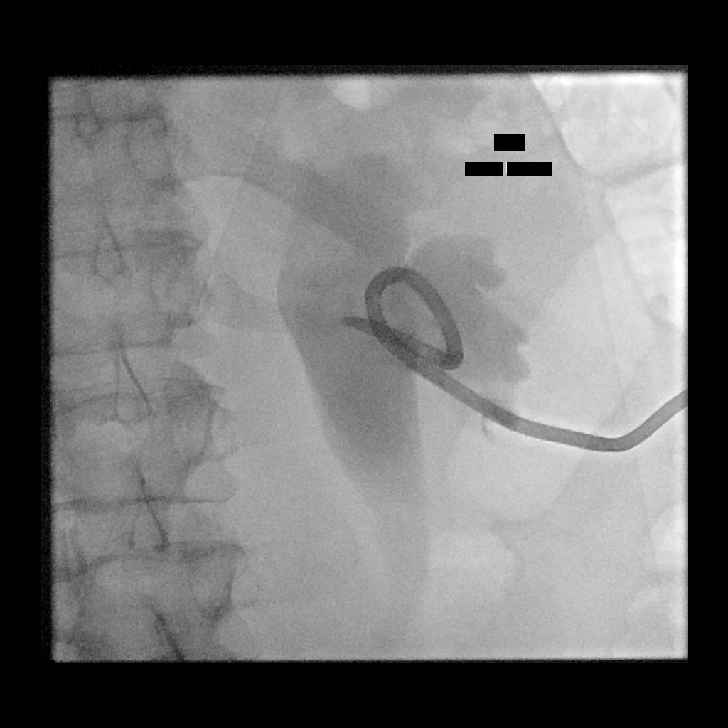

[Series 6: fl (-) angio · 2 of 2 slices shown (6 of 6)]
[im 1/2]
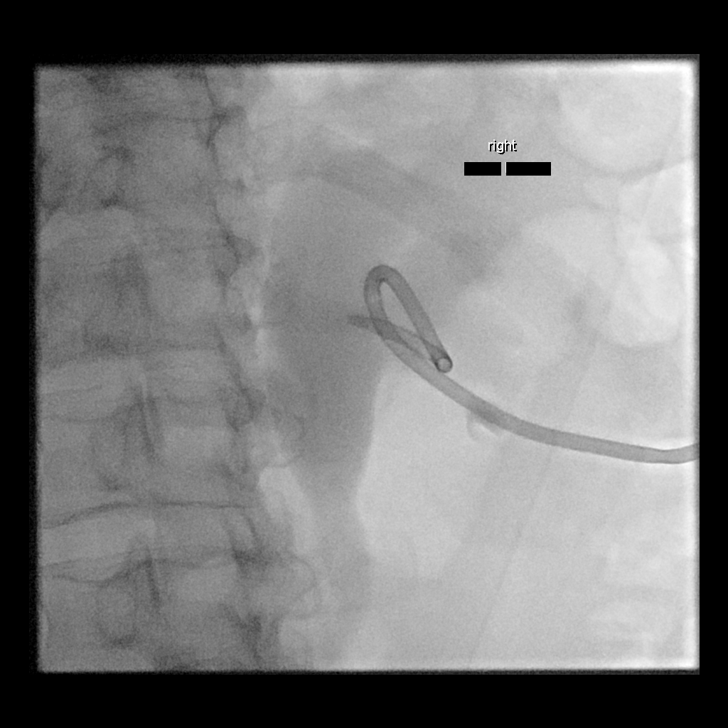
[im 2/2]
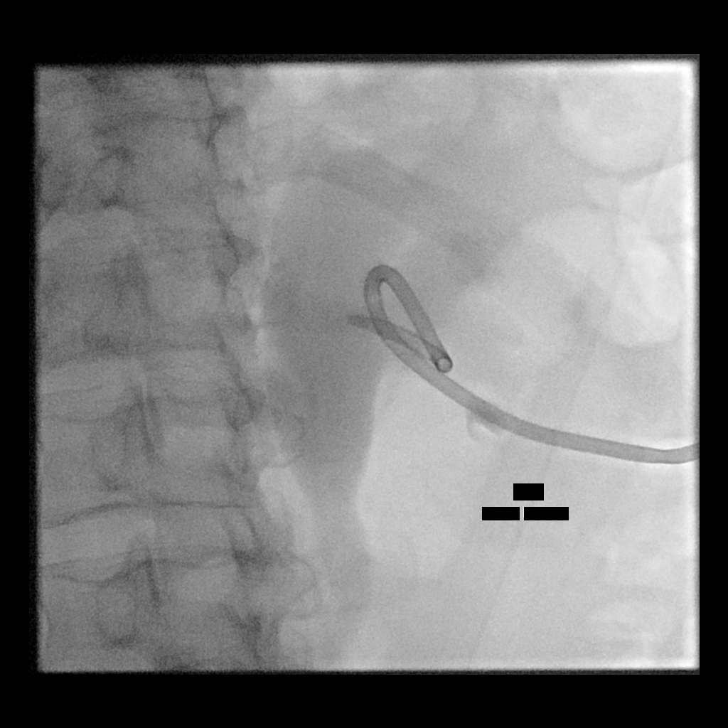

[7 of 7 positions shown; findings below may reference images not displayed]

MEDICATIONS:
Rocephin 2 g IV;; the antibiotic was administered in an appropriate
time frame prior to skin puncture.

ANESTHESIA/SEDATION:
Moderate (conscious) sedation was employed during this procedure. A
total of Versed 3 mg and Fentanyl 175 mcg was administered
intravenously.

Moderate Sedation Time: 37 minutes. The patient's level of
consciousness and vital signs were monitored continuously by
radiology nursing throughout the procedure under my direct
supervision.

CONTRAST:  A total of approximately 20 mL Isovue 300 was
administered into both renal collecting systems

FLUOROSCOPY TIME:  5 minutes, 18 seconds (259 mGy)

COMPLICATIONS:
None immediate.

PROCEDURE:
The procedure, risks, benefits, and alternatives were explained to
the patient. Questions regarding the procedure were encouraged and
answered. The patient understands and consents to the procedure. A
timeout was performed prior to the initiation of the procedure.

The bilateral flanks were prepped and draped in usual sterile
fashion with a sterile drape was applied covering the operative
field. A sterile gown and sterile gloves were used for the
procedure. Local anesthesia was provided with 1% Lidocaine with
epinephrine.

Ultrasound was used to localize the left kidney. Under direct
ultrasound guidance, a 21 gauge needle was advanced into the renal
collecting system. An ultrasound image documentation was performed.
Access within the collecting system was confirmed with the efflux of
urine followed by contrast injection.

Over a Nitrex wire, the tract was dilated with an Accustick stent.
Contrast injection confirmed appropriate positioning. Next, over a
short Amplatz wire and under intermittent fluoroscopic guidance, the
track was dilated allowing placement of a 10-French percutaneous
nephrostomy catheter with end coiled and locked within the left
renal pelvis. Contrast was injected and several sport radiographs
were obtained in various obliquities confirming access. The catheter
was secured at the skin with a Prolene retention suture and a
gravity bag was placed.

The identical procedure was then performed for the right kidney
ultimately allowing placement of a 10 French percutaneous
nephrostomy catheter via a right posterior inferior calyx with and
coiled and locked within right renal pelvis.

Both nephrostomy catheters were connected to gravity bags and
dressings were placed.

Patient tolerated the procedure well without immediate
postprocedural complication.
FINDINGS: Ultrasound scanning demonstrates a moderate to severely dilated
collecting systems bilaterally. Under direct ultrasound guidance,
posterior inferior calices were targeted bilaterally allowing
placement of 10-French percutaneous nephrostomy catheters under
intermittent fluoroscopic guidance. Bilateral percutaneous
nephrostomy catheters are coiled within the bilateral renal pelvis
ease. Contrast injection confirmed appropriate positioning.
IMPRESSION: Successful ultrasound and fluoroscopic guided placement of bilateral
10 French percutaneous nephrostomy catheters.

PLAN:
- Maintain bilateral nephrostomy catheters to gravity bags.
Recommend Q shift flushing with approximately 10 cc of saline until
urine is no longer blood tinged.

- Would allow for 2-3 weeks of track maturation prior to proceeding
with definitive bilateral nephrostograms and attempted placement of
antegrade ureteral stents.

## 2019-05-27 ENCOUNTER — Encounter (HOSPITAL_BASED_OUTPATIENT_CLINIC_OR_DEPARTMENT_OTHER): Payer: Self-pay | Admitting: Urology

## 2019-06-03 ENCOUNTER — Ambulatory Visit (HOSPITAL_BASED_OUTPATIENT_CLINIC_OR_DEPARTMENT_OTHER): Payer: 59 | Admitting: Internal Medicine

## 2019-06-03 ENCOUNTER — Other Ambulatory Visit: Payer: Self-pay

## 2019-06-03 VITALS — Ht 74.0 in | Wt 296.0 lb

## 2019-06-05 IMAGING — DX DG CHEST 2V
2 series · 2 of 2 positions shown · non-contrast
Comparison: 03/13/2006

CLINICAL DATA: Fever

EXAM:
CHEST - 2 VIEW

[chest pa]
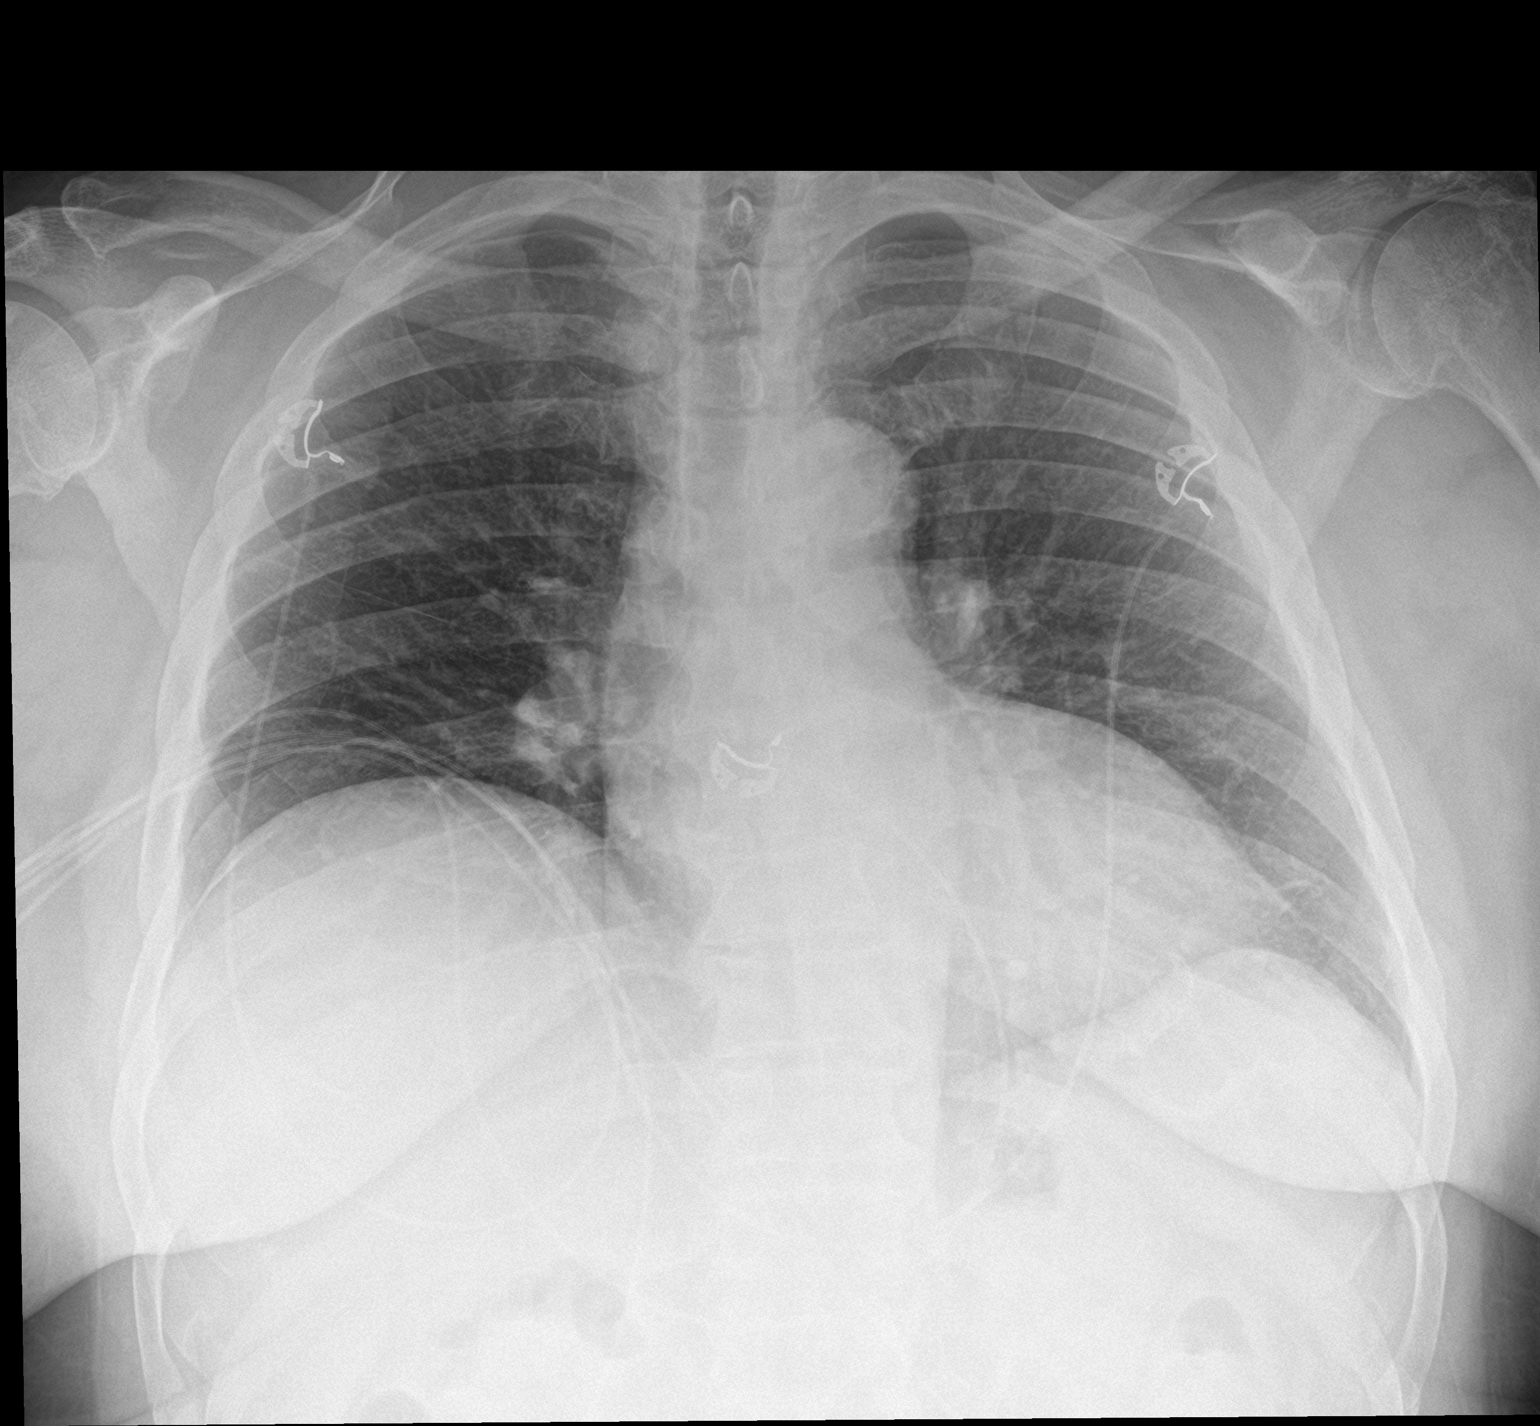

[chest lat]
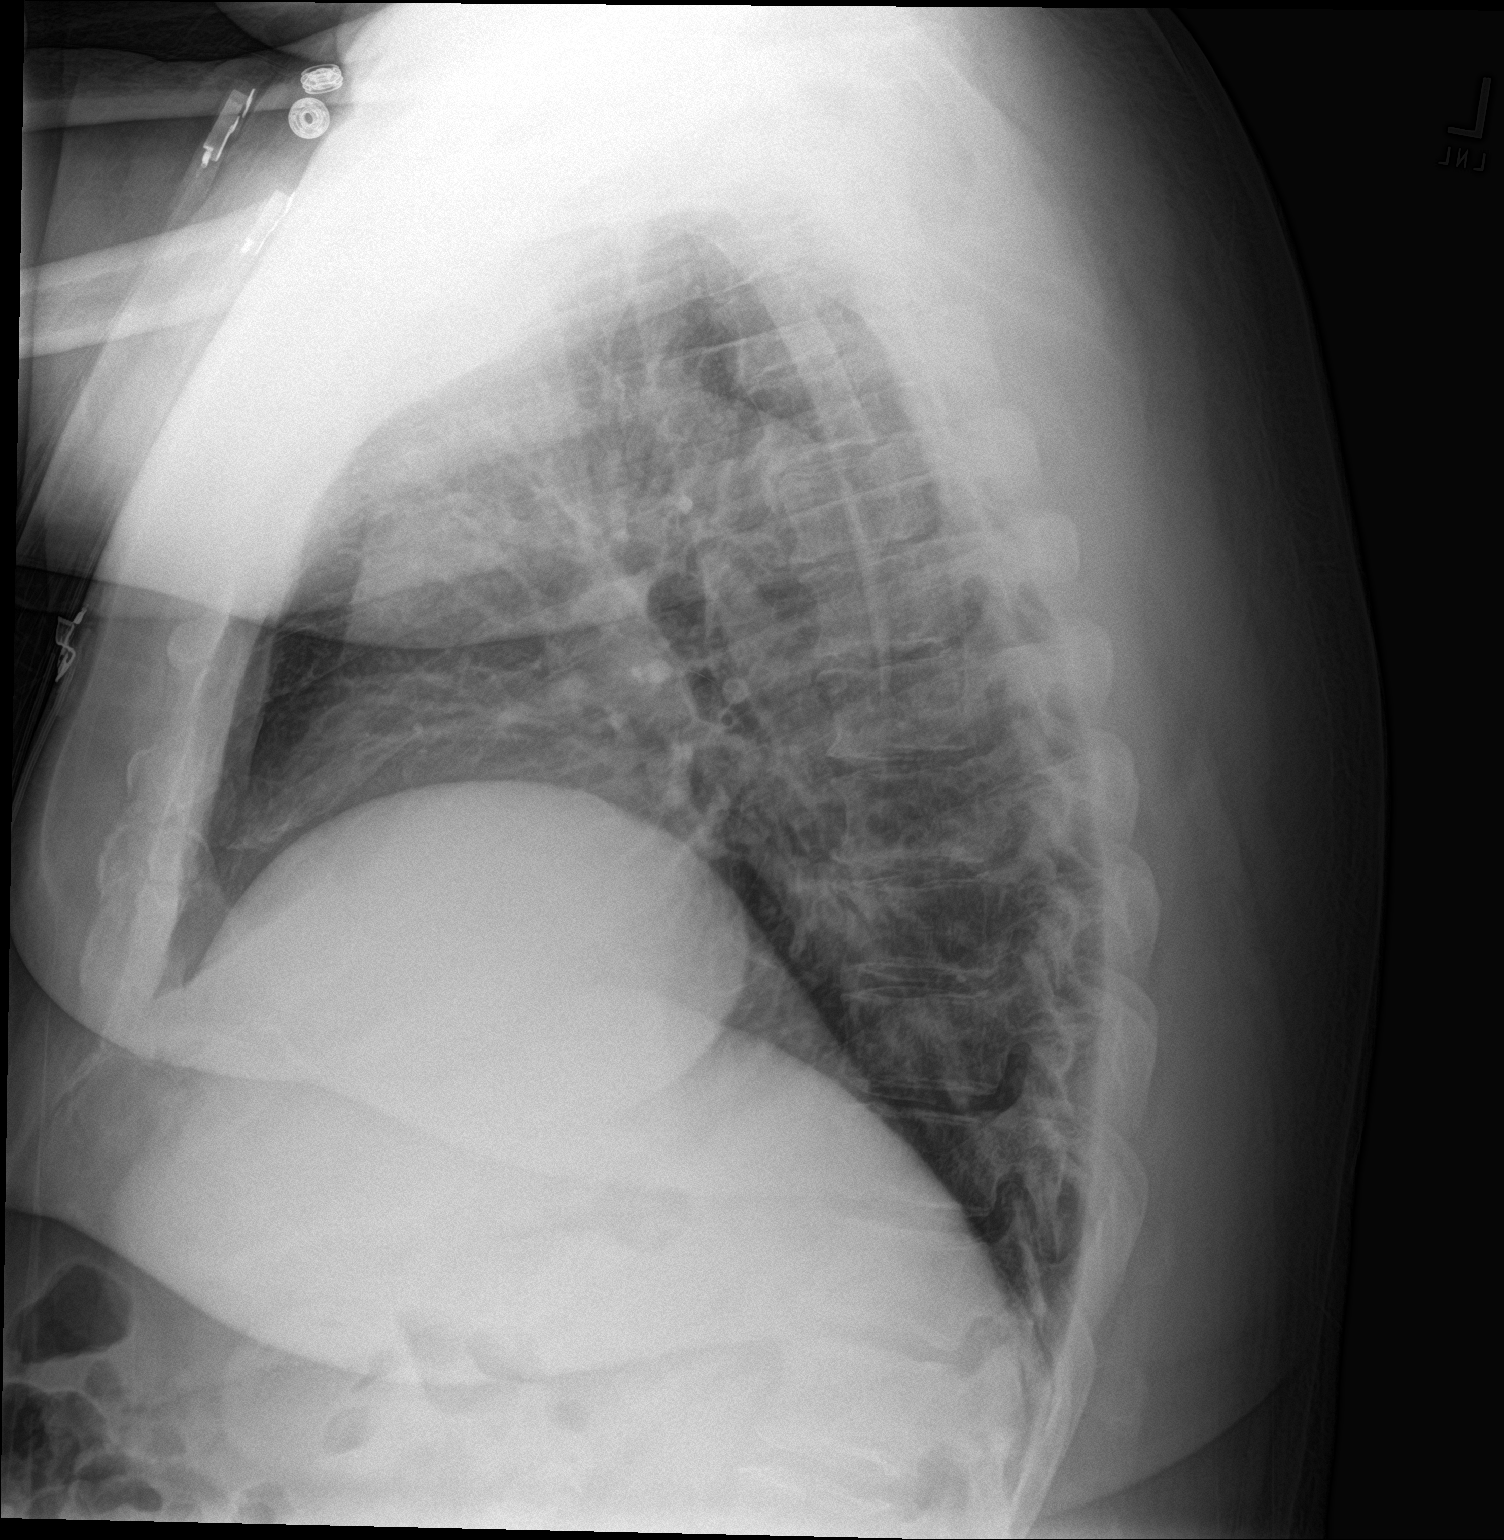

[2 of 2 positions shown; findings below may reference images not displayed]

FINDINGS: Normal heart size. Lungs under aerated and clear. No pneumothorax or
pleural effusion.
IMPRESSION: No active cardiopulmonary disease.

## 2019-06-07 ENCOUNTER — Ambulatory Visit (HOSPITAL_COMMUNITY)
Admission: RE | Admit: 2019-06-07 | Discharge: 2019-06-07 | Disposition: A | Discharge status: Home / Self Care | Payer: 59 | Source: Ambulatory Visit | Attending: Internal Medicine | Admitting: Internal Medicine

## 2019-06-07 ENCOUNTER — Other Ambulatory Visit: Payer: Self-pay

## 2019-06-07 DIAGNOSIS — D649 Anemia, unspecified: Secondary | ICD-10-CM | POA: Diagnosis not present

## 2019-06-07 DIAGNOSIS — N186 End stage renal disease: Secondary | ICD-10-CM | POA: Diagnosis present

## 2019-06-07 LAB — ABO/RH: ABO/RH(D): B POS

## 2019-06-07 LAB — PREPARE RBC (CROSSMATCH)

## 2019-06-07 MED ORDER — SODIUM CHLORIDE 0.9% IV SOLUTION
Freq: Once | INTRAVENOUS | Status: AC
  Administered 2019-06-07: 10:00:00 via INTRAVENOUS

## 2019-06-07 MED ORDER — ACETAMINOPHEN 325 MG PO TABS
325.0000 mg | ORAL_TABLET | Freq: Once | ORAL | Status: AC
  Administered 2019-06-07: 325 mg via ORAL
  Filled 2019-06-07: qty 1

## 2019-06-07 MED ORDER — DIPHENHYDRAMINE HCL 25 MG PO CAPS
25.0000 mg | ORAL_CAPSULE | Freq: Once | ORAL | Status: AC
  Administered 2019-06-07: 25 mg via ORAL
  Filled 2019-06-07: qty 1

## 2019-06-07 NOTE — Discharge Instructions (Signed)
Blood Transfusion, Adult, Care After This sheet gives you information about how to care for yourself after your procedure. Your doctor may also give you more specific instructions. If you have problems or questions, contact your doctor. Follow these instructions at home:   Take over-the-counter and prescription medicines only as told by your doctor.  Go back to your normal activities as told by your doctor.  Follow instructions from your doctor about how to take care of the area where an IV tube was put into your vein (insertion site). Make sure you: ? Wash your hands with soap and water before you change your bandage (dressing). If there is no soap and water, use hand sanitizer. ? Change your bandage as told by your doctor.  Check your IV insertion site every day for signs of infection. Check for: ? More redness, swelling, or pain. ? More fluid or blood. ? Warmth. ? Pus or a bad smell. Contact a doctor if:  You have more redness, swelling, or pain around the IV insertion site.  You have more fluid or blood coming from the IV insertion site.  Your IV insertion site feels warm to the touch.  You have pus or a bad smell coming from the IV insertion site.  Your pee (urine) turns pink, red, or brown.  You feel weak after doing your normal activities. Get help right away if:  You have signs of a serious allergic or body defense (immune) system reaction, including: ? Itchiness. ? Hives. ? Trouble breathing. ? Anxiety. ? Pain in your chest or lower back. ? Fever, flushing, and chills. ? Fast pulse. ? Rash. ? Watery poop (diarrhea). ? Throwing up (vomiting). ? Dark pee. ? Serious headache. ? Dizziness. ? Stiff neck. ? Yellow color in your face or the white parts of your eyes (jaundice). Summary  After a blood transfusion, return to your normal activities as told by your doctor.  Every day, check for signs of infection where the IV tube was put into your vein.  Some  signs of infection are warm skin, more redness and pain, more fluid or blood, and pus or a bad smell where the needle went in.  Contact your doctor if you feel weak or have any unusual symptoms. This information is not intended to replace advice given to you by your health care provider. Make sure you discuss any questions you have with your health care provider. Document Released: 07/11/2014 Document Revised: 10/25/2017 Document Reviewed: 02/12/2016 Elsevier Patient Education  2020 [employer]  

## 2019-06-07 NOTE — Progress Notes (Signed)
PATIENT CARE CENTER NOTE  Diagnosis: Anemia, ESRD   Provider:  Madelon Lips MD   Procedure: Type and screen for 1 unit of RBC   Note: Patient transfused with 1 unit of packed red blood cells. Pre transfusion Benadryl 25 mg and 325 mg of Tylenol were given.Tolerated well, vitals stable, discharge instructions given, verbalized understanding. Patient alert, oriented and ambulatory at the time of discharge.

## 2019-06-10 LAB — BPAM RBC
Blood Product Expiration Date: 202101052359
ISSUE DATE / TIME: 202012040952
Unit Type and Rh: 7300

## 2019-06-10 LAB — TYPE AND SCREEN
ABO/RH(D): B POS
Antibody Screen: NEGATIVE
Unit division: 0

## 2019-06-14 ENCOUNTER — Encounter: Payer: Self-pay | Admitting: Nurse Practitioner

## 2019-06-19 ENCOUNTER — Encounter (HOSPITAL_BASED_OUTPATIENT_CLINIC_OR_DEPARTMENT_OTHER): Payer: Self-pay

## 2019-06-19 ENCOUNTER — Ambulatory Visit (HOSPITAL_BASED_OUTPATIENT_CLINIC_OR_DEPARTMENT_OTHER): Payer: 59 | Attending: Family Medicine | Admitting: Internal Medicine

## 2019-06-19 DIAGNOSIS — G4733 Obstructive sleep apnea (adult) (pediatric): Secondary | ICD-10-CM | POA: Diagnosis present

## 2019-06-19 DIAGNOSIS — R0902 Hypoxemia: Secondary | ICD-10-CM | POA: Diagnosis not present

## 2019-06-19 DIAGNOSIS — R0683 Snoring: Secondary | ICD-10-CM | POA: Insufficient documentation

## 2019-06-21 ENCOUNTER — Ambulatory Visit (INDEPENDENT_AMBULATORY_CARE_PROVIDER_SITE_OTHER): Payer: Self-pay | Admitting: Physician Assistant

## 2019-06-21 ENCOUNTER — Other Ambulatory Visit: Payer: Self-pay

## 2019-06-21 DIAGNOSIS — N186 End stage renal disease: Secondary | ICD-10-CM

## 2019-06-21 DIAGNOSIS — Z992 Dependence on renal dialysis: Secondary | ICD-10-CM

## 2019-06-21 NOTE — Progress Notes (Signed)
    Postoperative Access Visit   History of Present Illness   Devon Cook is a 58 y.o. year old male who presents for postoperative follow-up for: left brachiocephalic arteriovenous fistula (Date: 03/15/2019 by Dr. Carlis Abbott).  The patient's wounds are well healed.  The patient notes absence of steal symptoms.  The patient is able to complete their activities of daily living.  The patient's current symptoms are: denies hand pian, fever, chills.  He does voice concerns about accessing his fistula at the present.  He is pursuing the possibility of eventual home HD. He is currently dialyzing via right IJ TDC without complications.  He denies drainage or tenderness at his catheter site.Treatment duration is 4.5 hours.   Physical Examination   Vitals:   06/21/19 1207  BP: 135/85  Pulse: 91  Temp: 97.6 F (36.4 C)  TempSrc: Temporal  SpO2: 100%  Weight: 296 lb (134.3 kg)  Height: 6\' 2"  (1.88 m)   Body mass index is 38 kg/m.  left arm Incision is well healed, 2+ radial pulse, hand grip is 5/5, sensation in digits is  intact, + palpable thrill. Fistula is easily palpable along its course of the uuper arm.    Medical Decision Making   Devon Cook is a 58 y.o. year old male who presents s/p left brachiocephalic arteriovenous fistula   Patent is without signs or symptoms of steal syndrome  The patient's access will be ready for use now; however, he relates anxiety and concern about accessing his fistula at this time and is requesting that this be delayed until after the first of the new year.  The patient's tunneled dialysis catheter can be removed when Nephrology is comfortable with the performance of the left brachiocephalic fistula. Will inform clinic and Dr. Madelon Lips that fistula may be accessed on July 22, 2019  The patient may follow up on a prn basis   Barbie Banner, PA-C [facility] Office: [phone]  Clinic MD: Donzetta Matters

## 2019-06-25 ENCOUNTER — Other Ambulatory Visit: Payer: Self-pay

## 2019-06-29 DIAGNOSIS — G4733 Obstructive sleep apnea (adult) (pediatric): Secondary | ICD-10-CM | POA: Diagnosis not present

## 2019-06-29 NOTE — Procedures (Signed)
     Patient Name: Devon Cook, Devon Cook Date: 06/19/2019 Gender: Male D.O.B: [birth year] Age (years): 15 Referring Provider: [name] Height (inches): 49 Interpreting Physician: Baird Lyons MD, ABSM Weight (lbs): 296 RPSGT: Jacolyn Reedy BMI: 38 MRN: [id number] Neck Size: 17.00  CLINICAL INFORMATION Sleep Study Type: HST Indication for sleep study: OSA Epworth Sleepiness Score: 13  SLEEP STUDY TECHNIQUE A multi-channel overnight portable sleep study was performed. The channels recorded were: nasal airflow, thoracic respiratory movement, and oxygen saturation with a pulse oximetry. Snoring was also monitored.  MEDICATIONS Patient self administered medications include: none reported.  SLEEP ARCHITECTURE Patient was studied for 576.3 minutes. The sleep efficiency was 100.0 % and the patient was supine for 1.8%. The arousal index was 0.0 per hour.  RESPIRATORY PARAMETERS The overall AHI was 99.8 per hour, with a central apnea index of 0.0 per hour. The oxygen nadir was 64% during sleep.  CARDIAC DATA Mean heart rate during sleep was 78.0 bpm.  IMPRESSIONS - Severe obstructive sleep apnea occurred during this study (AHI = 99.8/h). - No significant central sleep apnea occurred during this study (CAI = 0.0/h). - Oxygen desaturation was noted during this study (Min O2 = 64%). Mean sat 92%. - Time with O2 saturation 89% or less was 79.9 minutes. - Patient snored.  DIAGNOSIS - Obstructive Sleep Apnea (327.23 [G47.33 ICD-10]) - Nocturnal Hypoxemia (327.26 [G47.36 ICD-10])  RECOMMENDATIONS - Suggest CPAP titration sleep study or autopap. Other options would be based on clinical judgment - Be careful with alcohol, sedatives and other CNS depressants that may worsen sleep apnea and disrupt normal sleep architecture. - Sleep hygiene should be reviewed to assess factors that may improve sleep quality. - Weight management and regular exercise should be initiated or  continued.  [Electronically signed] 06/29/2019 11:25 AM  Baird Lyons MD, ABSM Diplomate, American Board of Sleep Medicine   NPI: [id number]                        [place] [place] [facility]  ELECTRONICALLY SIGNED ON:  06/29/2019, 11:18 AM [place] PH: [phone]   FX: [phone] [place]

## 2019-07-02 ENCOUNTER — Other Ambulatory Visit: Payer: Self-pay

## 2019-07-02 DIAGNOSIS — G4733 Obstructive sleep apnea (adult) (pediatric): Secondary | ICD-10-CM

## 2019-07-03 ENCOUNTER — Ambulatory Visit (INDEPENDENT_AMBULATORY_CARE_PROVIDER_SITE_OTHER): Payer: 59 | Admitting: Nurse Practitioner

## 2019-07-03 ENCOUNTER — Encounter: Payer: Self-pay | Admitting: Nurse Practitioner

## 2019-07-03 VITALS — BP 100/70 | HR 104 | Temp 98.5°F | Ht 71.5 in | Wt 295.4 lb

## 2019-07-03 DIAGNOSIS — Z1159 Encounter for screening for other viral diseases: Secondary | ICD-10-CM

## 2019-07-03 DIAGNOSIS — D649 Anemia, unspecified: Secondary | ICD-10-CM

## 2019-07-03 DIAGNOSIS — R195 Other fecal abnormalities: Secondary | ICD-10-CM | POA: Diagnosis not present

## 2019-07-03 MED ORDER — NA SULFATE-K SULFATE-MG SULF 17.5-3.13-1.6 GM/177ML PO SOLN: ORAL | 0 refills | Status: DC

## 2019-07-03 NOTE — Patient Instructions (Addendum)
If you are age 59 or older, your body mass index should be between 23-30. Your Body mass index is 40.62 kg/m. If this is out of the aforementioned range listed, please consider follow up with your Primary Care Provider.  If you are age 76 or younger, your body mass index should be between 19-25. Your Body mass index is 40.62 kg/m. If this is out of the aformentioned range listed, please consider follow up with your Primary Care Provider.   You have been scheduled for an endoscopy and colonoscopy. Please follow the written instructions given to you at your visit today. Please pick up your prep supplies at the pharmacy within the next 1-3 days. If you use inhalers (even only as needed), please bring them with you on the day of your procedure. Your physician has requested that you go to www.startemmi.com and enter the access code given to you at your visit today. This web site gives a general overview about your procedure. However, you should still follow specific instructions given to you by our office regarding your preparation for the procedure.  We have sent the following medications to your pharmacy for you to pick up at your convenience: Suprep   Thank you for choosing me and [facility].   Tye Savoy, NP

## 2019-07-03 NOTE — Progress Notes (Signed)
ASSESSMENT / PLAN:   58 year old male with history of GERD, prostate cancer, hypertension, end-stage renal disease on hemodialysis  Normocytic anemia (normal iron indices)  /  heme positive dark stool on iron but also occasional bright red blood per rectum. Slow decline in hemoglobin since September . On chronic Epogen and oral iron.  --For further evaluation patient will be scheduled for EGD and colonoscopy. The risks and benefits of EGD and colonoscopy with possible polypectomy / biopsies were discussed and the patient agrees to proceed.    HPI:    Referring Provider:  Jill Alexanders, MD     Reason for referral:    Anemia, FOBT positive  Chief Complaint:    Patient is a 58 year old male with GERD, prostate cancer, hypertension , HCV, treated with Harvoni and no undetectable, ESRD on HD T, Th, Sat..  He has chronic anemia with a baseline hemoglobin in the low 10 range. Hgb has been slowly declining since mid September ( 10.3 >> 9.8 >> 7.9). Transfused a unit of blood on 12/9.  No evidence for iron deficiency with ferritin in 400's, TIBC 300.  She is on chronic Epogen and oral iron which make his stools dark . He has occasional bright red blood when wiping.  He takes a baby aspirin every day, no other NSAIDs no bowel habit changes or unexpected weight loss.  No family medical history of colon cancer.  Patient has never had a colonoscopy.  Wren gives a history of GERD but has been asymptomatic off medications for the last few months.  Past Medical History:  Diagnosis Date  . Anemia, chronic renal failure    followed by dr Lorrene Reid--- dx 05/ 2019---  treated with Iron infusions and procrit  . Bilateral hydronephrosis    malignant-- chronic  . ED (erectile dysfunction)   . Edema of both lower extremities    05-22-2019  per pt started 3 weeks ago, pt is ESRD,  hemodialysis was started  . End stage renal failure on dialysis [facility]) hemodialysis T/TH/S @ [facility]---started first week of Nov 2020   nephrologist-- dr c. 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 GERD (gastroesophageal reflux disease)   . Gout    05-22-2019  per pt unsure when last episode   . Herpes genitalis in men   . History of hepatitis C 2017   dx 11/ 207-- treated with Harvoni (in epic 04/ 2019 lab result non-detectable)  . History of lower GI bleeding 11/2017  . Hypertension   . Metabolic acidosis   . Prostate 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 [facility]) urologist-- dr Tresa Moore   dx 06/ 2019-- advanced w/ mets to bone and pelvic lymphadeopathy  . S/P arteriovenous (AV) fistula creation    03-15-2019  by dr Carlis Abbott, left branhiocephallic  . Secondary hyperparathyroidism of renal origin ([place])   . Wears glasses      Past Surgical History:  Procedure Laterality Date  . AV FISTULA PLACEMENT Left 03/15/2019   Procedure: ARTERIOVENOUS (AV) FISTULA CREATION LEFT ARM;  Surgeon: Marty Heck, MD;  Location: [place];  Service: Vascular;  Laterality: Left;  . CYSTOSCOPY W/ URETERAL STENT PLACEMENT Bilateral 12/08/2017   Procedure: CYSTOSCOPY WITH RETROGRADE PYELOGRAM/URETERAL STENT PLACEMENT;  Surgeon: Alexis Frock, MD;  Location: [facility];  Service: Urology;  Laterality: Bilateral;  . CYSTOSCOPY W/ URETERAL STENT PLACEMENT Bilateral 04/20/2018   Procedure: CYSTOSCOPY  WITH STENT REPLACEMENT;  Surgeon: Alexis Frock, MD;  Location[facility];  Service: Urology;  Laterality: Bilateral;  . CYSTOSCOPY W/ URETERAL STENT PLACEMENT Bilateral 08/10/2018   Procedure: CYSTOSCOPY WITH STENT REPLACEMENT, BILATERAL RETROGRADES;  Surgeon: Alexis Frock, MD;  Location: [facility];  Service: Urology;  Laterality: Bilateral;  45 MINS  . CYSTOSCOPY W/ URETERAL STENT PLACEMENT Bilateral 01/18/2019   Procedure: CYSTOSCOPY WITH RETROGRADE PYELOGRAM/URETERAL STENT PLACEMENT;  Surgeon: Alexis Frock, MD;  Location: [facility];  Service: Urology;  Laterality: Bilateral;  . CYSTOSCOPY W/ URETERAL STENT PLACEMENT Bilateral 05/24/2019   Procedure: CYSTOSCOPY WITH RETROGRADE PYELOGRAM/BILATERAL URETERAL STENT EXCHANGE;  Surgeon: Alexis Frock, MD;  Location: [facility];  Service: Urology;  Laterality: Bilateral;  . INSERTION OF DIALYSIS CATHETER Right 05/06/2019   Procedure: INSERTION OF TUNNELED  DIALYSIS CATHETER;  Surgeon: Rosetta Posner, MD;  Location: [place];  Service: Vascular;  Laterality: Right;  . IR NEPHROSTOMY PLACEMENT LEFT  11/15/2017  . IR NEPHROSTOMY PLACEMENT RIGHT  11/15/2017  . PROSTATE BIOPSY N/A 12/08/2017   Procedure: BIOPSY TRANSRECTAL ULTRASONIC PROSTATE (TUBP), PERINEAL BIOPSY;  Surgeon: Alexis Frock, MD;  Location: [facility];  Service: Urology;  Laterality: N/A;  . TRANSURETHRAL RESECTION OF BLADDER TUMOR N/A 12/08/2017   Procedure: TRANSURETHRAL RESECTION OF BLADDER TUMOR (TURBT);  Surgeon: Alexis Frock, MD;  Location: [facility];  Service: Urology;  Laterality: N/A;  . TRICEPS TENDON REPAIR Bilateral 08/2005   Family History  Problem Relation Age of Onset  . Hypertension Mother   . Heart disease Mother   . Diabetes Sister   . Diabetes Brother   . Prostate cancer Maternal Grandfather   . Diabetes Sister   . Prostate cancer Maternal Uncle    Social History   Tobacco Use  . Smoking status: Current Some Day Smoker    Packs/day: 0.50    Years: 6.00    Pack years: 3.00    Types: Cigarettes, Pipe, Cigars  . Smokeless tobacco: Never Used  . Tobacco comment: occ cigar  Substance Use Topics  . Alcohol use: Yes    Comment: 2 drinks per weekend  . Drug use: No   Current Outpatient Medications  Medication Sig Dispense Refill  . allopurinol (ZYLOPRIM) 100 MG tablet TAKE 1 TABLET BY MOUTH ONCE DAILY (Patient taking differently: Take 100 mg by mouth 2 (two) times daily. ) 30 tablet 0  . ALPRAZolam (XANAX) 0.25 MG tablet Take 1 tablet (0.25 mg total) by mouth daily as needed  for anxiety. 30 tablet 1  . amLODipine (NORVASC) 10 MG tablet Take 10 mg by mouth at bedtime.     Marland Kitchen aspirin EC 81 MG tablet Take 81 mg by mouth daily.    Marland Kitchen epoetin alfa (EPOGEN,PROCRIT) 2000 UNIT/ML injection Inject 2,000 Units into the vein every 14 (fourteen) days.     . ferric citrate (AURYXIA) 1 GM 210 MG(Fe) tablet Take 420 mg by mouth 3 (three) times daily with meals.     Marland Kitchen guaiFENesin-dextromethorphan (ROBITUSSIN DM) 100-10 MG/5ML syrup Take 5 mLs by mouth every 4 (four) hours as needed for cough.    . methocarbamol (ROBAXIN) 750 MG tablet Take 750 mg by mouth at bedtime as needed for muscle spasms.    . Multiple Vitamins-Minerals (MULTIVITAMIN WITH MINERALS) tablet Take 1 tablet by mouth daily.      Marland Kitchen oxyCODONE-acetaminophen (PERCOCET) 5-325 MG tablet Take 1 tablet by mouth every 8 (eight) hours as needed for moderate pain. From metastatic cancer  60 tablet 0  . Oxymetazoline HCl (VICKS SINEX NA) Place 1 spray into the nose daily as needed (congestion).    . pantoprazole (PROTONIX) 40 MG tablet TAKE 1 TABLET BY MOUTH ONCE DAILY (Patient taking differently: Take 40 mg by mouth every morning. ) 30 tablet 0   No current facility-administered medications for this visit.   No Known Allergies   Review of Systems: All systems reviewed and negative except where noted in HPI.    Physical Exam:    Wt Readings from Last 3 Encounters:  07/03/19 295 lb 6 oz (134 kg)  06/21/19 296 lb (134.3 kg)  06/03/19 296 lb (134.3 kg)    BP 100/70 (BP Location: Right Arm, Patient Position: Sitting, Cuff Size: Large)   Pulse (!) 104   Temp 98.5 F (36.9 C)   Ht 5' 11.5" (1.816 m) Comment: height measured without shoes  Wt 295 lb 6 oz (134 kg)   BMI 40.62 kg/m  Constitutional:  Pleasant male in no acute distress. Psychiatric: Normal mood and affect. Behavior is normal. EENT: Pupils normal.  Conjunctivae are normal. No scleral icterus. Neck supple.  Cardiovascular: Normal rate, regular rhythm.    Pulmonary/chest: Effort normal and breath sounds normal. No wheezing, rales or rhonchi. Abdominal: Soft, nondistended, nontender. Bowel sounds active throughout. There are no masses palpable. No hepatomegaly. Neurological: Alert and oriented to person place and time. Skin: Skin is warm and dry. No rashes noted.  Tye Savoy, NP  07/03/2019, 11:53 AM  Cc: [name], MD

## 2019-07-04 ENCOUNTER — Encounter: Payer: Self-pay | Admitting: Nurse Practitioner

## 2019-07-15 ENCOUNTER — Ambulatory Visit (INDEPENDENT_AMBULATORY_CARE_PROVIDER_SITE_OTHER): Payer: 59

## 2019-07-15 ENCOUNTER — Other Ambulatory Visit: Payer: Self-pay | Admitting: Gastroenterology

## 2019-07-15 DIAGNOSIS — Z1159 Encounter for screening for other viral diseases: Secondary | ICD-10-CM

## 2019-07-15 LAB — SARS CORONAVIRUS 2 (TAT 6-24 HRS): SARS Coronavirus 2: NEGATIVE

## 2019-07-16 ENCOUNTER — Encounter: Payer: Self-pay | Admitting: Emergency Medicine

## 2019-07-16 ENCOUNTER — Telehealth: Payer: Self-pay | Admitting: Gastroenterology

## 2019-07-16 MED ORDER — GOLYTELY 236 G PO SOLR: 4000.0000 mL | Freq: Once | ORAL | 0 refills | Status: AC

## 2019-07-16 NOTE — Telephone Encounter (Signed)
Spoke with patient he requests Golytely. He states he has already called the pharmacy and that they do have Golytely in stock. I asked him if he was able to access his mychart and he stated he cannot. Sent him a code to reactivate and will send instructions as a mychart message or he can access it under letters. He consented to having paperwork send over mychart and verbalized understanding.

## 2019-07-16 NOTE — Telephone Encounter (Signed)
Golytely prep is okay. Thanks.

## 2019-07-16 NOTE — Telephone Encounter (Signed)
Dr. Tarri Glenn sent this to me instead of to you.

## 2019-07-16 NOTE — Telephone Encounter (Signed)
Pt requested to change prescription from Suprep to [facility] and sent to [facility] on [address]

## 2019-07-16 NOTE — Telephone Encounter (Signed)
Ann from [facility] called to inform that prep is contradictive for dialysis patients.   She requested prep script to be written for Golytely.    Pt is scheduled for a colonoscopy tomorrow.

## 2019-07-17 ENCOUNTER — Ambulatory Visit (AMBULATORY_SURGERY_CENTER): Payer: 59 | Admitting: Gastroenterology

## 2019-07-17 ENCOUNTER — Encounter: Payer: Self-pay | Admitting: Gastroenterology

## 2019-07-17 ENCOUNTER — Other Ambulatory Visit: Payer: Self-pay

## 2019-07-17 VITALS — BP 183/97 | HR 77 | Temp 98.4°F | Resp 14 | Ht 71.0 in | Wt 295.0 lb

## 2019-07-17 DIAGNOSIS — D123 Benign neoplasm of transverse colon: Secondary | ICD-10-CM | POA: Diagnosis not present

## 2019-07-17 DIAGNOSIS — K219 Gastro-esophageal reflux disease without esophagitis: Secondary | ICD-10-CM

## 2019-07-17 DIAGNOSIS — K5730 Diverticulosis of large intestine without perforation or abscess without bleeding: Secondary | ICD-10-CM

## 2019-07-17 DIAGNOSIS — R195 Other fecal abnormalities: Secondary | ICD-10-CM | POA: Diagnosis present

## 2019-07-17 DIAGNOSIS — D120 Benign neoplasm of cecum: Secondary | ICD-10-CM

## 2019-07-17 DIAGNOSIS — K2951 Unspecified chronic gastritis with bleeding: Secondary | ICD-10-CM | POA: Diagnosis not present

## 2019-07-17 DIAGNOSIS — K2950 Unspecified chronic gastritis without bleeding: Secondary | ICD-10-CM | POA: Diagnosis not present

## 2019-07-17 DIAGNOSIS — K2981 Duodenitis with bleeding: Secondary | ICD-10-CM | POA: Diagnosis not present

## 2019-07-17 DIAGNOSIS — K2980 Duodenitis without bleeding: Secondary | ICD-10-CM | POA: Diagnosis not present

## 2019-07-17 MED ORDER — SODIUM CHLORIDE 0.9 % IV SOLN: 500.0000 mL | Freq: Once | INTRAVENOUS | Status: DC

## 2019-07-17 MED ORDER — PANTOPRAZOLE SODIUM 40 MG PO TBEC: 40.0000 mg | DELAYED_RELEASE_TABLET | Freq: Two times a day (BID) | ORAL | 0 refills | Status: AC

## 2019-07-17 NOTE — Progress Notes (Signed)
Pt Drowsy. VSS. To PACU, report to RN. No anesthetic complications noted.  

## 2019-07-17 NOTE — Progress Notes (Signed)
Called to room to assist during endoscopic procedure.  Patient ID and intended procedure confirmed with present staff. Received instructions for my participation in the procedure from the performing physician.Called to room to assist during endoscopic procedure.  Patient ID and intended procedure confirmed with present staff. Received instructions for my participation in the procedure from the performing physician. 

## 2019-07-17 NOTE — Progress Notes (Signed)
Temp  LC  VS  DT  Pt's states no medical or surgical changes since previsit or office visit.

## 2019-07-17 NOTE — Patient Instructions (Addendum)
Handouts were given to you on gastritis, polyps, diverticulosis, and a high fiber diet with liberal fluid intake You may resume your current medications today. Await biopsy results. Please call if any questions or concerns.  No aspirin, ibuprofen, naproxen, or other non-steroidal anti-inflammatory drugs.   Increase Pantoprazole to 40mg  twice daily for 8 weeks.   YOU HAD AN ENDOSCOPIC PROCEDURE TODAY AT [facility]:   Refer to the procedure report that was given to you for any specific questions about what was found during the examination.  If the procedure report does not answer your questions, please call your gastroenterologist to clarify.  If you requested that your care partner not be given the details of your procedure findings, then the procedure report has been included in a sealed envelope for you to review at your convenience later.  YOU SHOULD EXPECT: Some feelings of bloating in the abdomen. Passage of more gas than usual.  Walking can help get rid of the air that was put into your GI tract during the procedure and reduce the bloating. If you had a lower endoscopy (such as a colonoscopy or flexible sigmoidoscopy) you may notice spotting of blood in your stool or on the toilet paper. If you underwent a bowel prep for your procedure, you may not have a normal bowel movement for a few days.  Please Note:  You might notice some irritation and congestion in your nose or some drainage.  This is from the oxygen used during your procedure.  There is no need for concern and it should clear up in a day or so.  SYMPTOMS TO REPORT IMMEDIATELY:   Following lower endoscopy (colonoscopy or flexible sigmoidoscopy):  Excessive amounts of blood in the stool  Significant tenderness or worsening of abdominal pains  Swelling of the abdomen that is new, acute  Fever of 100F or higher   Following upper endoscopy (EGD)  Vomiting of blood or coffee ground material  New chest pain or  pain under the shoulder blades  Painful or persistently difficult swallowing  New shortness of breath  Fever of 100F or higher  Black, tarry-looking stools  For urgent or emergent issues, a gastroenterologist can be reached at any hour by calling [phone].   DIET:  We do recommend a small meal at first, but then you may proceed to your regular diet.  Drink plenty of fluids but you should avoid alcoholic beverages for 24 hours.  ACTIVITY:  You should plan to take it easy for the rest of today and you should NOT DRIVE or use heavy machinery until tomorrow (because of the sedation medicines used during the test).    FOLLOW UP: Our staff will call the number listed on your records 48-72 hours following your procedure to check on you and address any questions or concerns that you may have regarding the information given to you following your procedure. If we do not reach you, we will leave a message.  We will attempt to reach you two times.  During this call, we will ask if you have developed any symptoms of COVID 19. If you develop any symptoms (ie: fever, flu-like symptoms, shortness of breath, cough etc.) before then, please call [phone].  If you test positive for Covid 19 in the 2 weeks post procedure, please call and report this information to [place].    If any biopsies were taken you will be contacted by phone or by letter within the next 1-3 weeks.  Please call  us at [phone] if you have not heard about the biopsies in 3 weeks.    SIGNATURES/CONFIDENTIALITY: You and/or your care partner have signed paperwork which will be entered into your electronic medical record.  These signatures attest to the fact that that the information above on your After Visit Summary has been reviewed and is understood.  Full responsibility of the confidentiality of this discharge information lies with you and/or your care-partner.

## 2019-07-17 NOTE — Progress Notes (Signed)
No problems noted in the recovery room. maw 

## 2019-07-17 NOTE — Op Note (Signed)
[place] Patient Name: Devon Cook Procedure Date: 07/17/2019 2:14 PM MRN: [id number] Endoscopist: Thornton Park MD, MD Age: 59 Referring MD:  Date of Birth: 1961-03-25 Gender: Male Account #: [id number] Procedure:                Colonoscopy Indications:              Heme positive stool                           Progressive normocytic anemia                           No known family history of colon cancer or polyps Medicines:                Monitored Anesthesia Care Procedure:                Pre-Anesthesia Assessment:                           - Prior to the procedure, a History and Physical                            was performed, and patient medications and                            allergies were reviewed. The patient's tolerance of                            previous anesthesia was also reviewed. The risks                            and benefits of the procedure and the sedation                            options and risks were discussed with the patient.                            All questions were answered, and informed consent                            was obtained. Prior Anticoagulants: The patient has                            taken no previous anticoagulant or antiplatelet                            agents. ASA Grade Assessment: III - A patient with                            severe systemic disease. After reviewing the risks                            and benefits, the patient was deemed in  satisfactory condition to undergo the procedure.                           After obtaining informed consent, the colonoscope                            was passed under direct vision. Throughout the                            procedure, the patient's blood pressure, pulse, and                            oxygen saturations were monitored continuously. The                            Colonoscope was introduced through the anus and                      advanced to the 4 cm into the ileum. The                            colonoscopy was performed with moderate difficulty                            due to significant looping and persistant                            autodecompression of air. Successful completion of                            the procedure was aided by applying abdominal                            pressure. The patient tolerated the procedure well.                            The quality of the bowel preparation was good. The                            terminal ileum, ileocecal valve, appendiceal                            orifice, and rectum were photographed. Scope In: 2:29:36 PM Scope Out: 2:51:30 PM Scope Withdrawal Time: 0 hours 18 minutes 16 seconds  Total Procedure Duration: 0 hours 21 minutes 54 seconds  Findings:                 The perianal and digital rectal examinations were                            normal.                           A few small and large-mouthed diverticula were  found in the sigmoid colon and ascending colon.                           A 4 mm polyp was found in the splenic flexure. The                            polyp was sessile. The polyp was removed with a                            cold snare. Resection and retrieval were complete.                            Estimated blood loss was minimal.                           A 3 mm polyp was found in the mid transverse colon.                            The polyp was sessile. The polyp was removed with a                            cold snare. Resection and retrieval were complete.                            Estimated blood loss was minimal.                           Two sessile polyps were found in the cecum. The                            polyps were less than 1 mm in size. These polyps                            were removed with a cold snare. Resection and                            retrieval were  complete. Estimated blood loss was                            minimal.                           The exam was otherwise without abnormality on                            direct and retroflexion views. Complications:            No immediate complications. Estimated blood loss:                            Minimal. Estimated Blood Loss:     Estimated blood loss was minimal. Impression:               - Diverticulosis in the sigmoid colon  and in the                            ascending colon.                           - One 4 mm polyp at the splenic flexure, removed                            with a cold snare. Resected and retrieved.                           - One 3 mm polyp in the mid transverse colon,                            removed with a cold snare. Resected and retrieved.                           - Two less than 1 mm polyps in the cecum, removed                            with a cold snare. Resected and retrieved.                           - The examination was otherwise normal on direct                            and retroflexion views. Recommendation:           - Patient has a contact number available for                            emergencies. The signs and symptoms of potential                            delayed complications were discussed with the                            patient. Return to normal activities tomorrow.                            Written discharge instructions were provided to the                            patient.                           - High fiber diet.                           - Continue present medications.                           - Await pathology results.                           -  Repeat colonoscopy date to be determined after                            pending pathology results are reviewed for                            surveillance based on pathology results. Thornton Park MD, MD 07/17/2019 3:03:55 PM This report has been signed  electronically.

## 2019-07-17 NOTE — Op Note (Signed)
[place] Patient Name: Devon Cook Procedure Date: 07/17/2019 2:14 PM MRN: [id number] Endoscopist: Thornton Park MD, MD Age: 59 Referring MD:  Date of Birth: 1961/01/14 Gender: Male Account #: [id number] Procedure:                Upper GI endoscopy Indications:              Heme positive stool                           Progressive normocytic anemia Medicines:                Monitored Anesthesia Care Procedure:                Pre-Anesthesia Assessment:                           - Prior to the procedure, a History and Physical                            was performed, and patient medications and                            allergies were reviewed. The patient's tolerance of                            previous anesthesia was also reviewed. The risks                            and benefits of the procedure and the sedation                            options and risks were discussed with the patient.                            All questions were answered, and informed consent                            was obtained. Prior Anticoagulants: The patient has                            taken no previous anticoagulant or antiplatelet                            agents. ASA Grade Assessment: III - A patient with                            severe systemic disease. After reviewing the risks                            and benefits, the patient was deemed in                            satisfactory condition to undergo the procedure.  After obtaining informed consent, the endoscope was                            passed under direct vision. Throughout the                            procedure, the patient's blood pressure, pulse, and                            oxygen saturations were monitored continuously. The                            Endoscope was introduced through the mouth, and                            advanced to the third part of duodenum. The upper                            GI endoscopy was accomplished without difficulty.                            The patient tolerated the procedure well. Scope In: Scope Out: Findings:                 The esophagus was normal.                           Scattered moderate inflammation characterized by                            erosions, erythema, friability and granularity was                            found in the gastric body. Biopsies were taken from                            the antrum, body, and fundus with a cold forceps                            for histology. Estimated blood loss was minimal.                           Diffuse moderate mucosal changes of erythema,                            friability, and granularity with a few scattered                            erosions were found in the duodenal bulb. Biopsies                            were taken from the body, 2nd portion, and 3rd  portions of the duodenum with a cold forceps for                            histology. Complications:            No immediate complications. Estimated blood loss:                            Minimal. Estimated Blood Loss:     Estimated blood loss was minimal. Impression:               - Normal esophagus.                           - Gastritis. Biopsied.                           - Mucosal changes in the duodenum. Biopsied.                           - The gastric and duodenal findings would explain                            his occult blood in the stool and may explain his                            concurrent anemia. Recommendation:           - Patient has a contact number available for                            emergencies. The signs and symptoms of potential                            delayed complications were discussed with the                            patient. Return to normal activities tomorrow.                            Written discharge instructions were provided to  the                            patient.                           - Resume previous diet.                           - Continue present medications. Increase                            pantoprazole to 40 mg BID x 8 weeks.                           - No aspirin, ibuprofen, naproxen, or other  non-steroidal anti-inflammatory drugs.                           - Await pathology results.                           - Proceed with colonoscopy today as previously                            planned. Thornton Park MD, MD 07/17/2019 2:58:57 PM This report has been signed electronically.

## 2019-07-18 ENCOUNTER — Encounter: Payer: Self-pay | Admitting: Family Medicine

## 2019-07-18 ENCOUNTER — Ambulatory Visit (INDEPENDENT_AMBULATORY_CARE_PROVIDER_SITE_OTHER): Payer: 59 | Admitting: Family Medicine

## 2019-07-18 VITALS — BP 147/82 | Temp 98.4°F | Wt 295.0 lb

## 2019-07-18 DIAGNOSIS — K635 Polyp of colon: Secondary | ICD-10-CM

## 2019-07-18 DIAGNOSIS — N184 Chronic kidney disease, stage 4 (severe): Secondary | ICD-10-CM | POA: Diagnosis not present

## 2019-07-18 DIAGNOSIS — J069 Acute upper respiratory infection, unspecified: Secondary | ICD-10-CM

## 2019-07-18 DIAGNOSIS — G4733 Obstructive sleep apnea (adult) (pediatric): Secondary | ICD-10-CM | POA: Diagnosis not present

## 2019-07-18 MED ORDER — BENZONATATE 100 MG PO CAPS: 100.0000 mg | ORAL_CAPSULE | Freq: Two times a day (BID) | ORAL | 0 refills | Status: DC | PRN

## 2019-07-18 NOTE — Progress Notes (Signed)
   Subjective:    Patient ID: Devon Cook, male    DOB: 1961-03-14, 59 y.o.   MRN: [id number]  HPI Documentation for virtual telephone encounter.  Documentation for virtual audio and video telecommunications through [place] encounter: The patient was located at home. The provider was located in the office. The patient did consent to this visit and is aware of possible charges through their insurance for this visit. The other persons participating in this telemedicine service were none. This virtual service is not related to other E/M service within previous 7 days. He complains of a weeklong history of chest congestion, slight nonproductive cough, sneezing, rhinorrhea and ear congestion but no earache, fever, chills.  His cough is nonproductive.  He did have a Covid test on Monday which was negative.  His main complaint is coughing mainly at night.  It is interfering with sleep.  He also recently had a colonoscopy which apparently did show polyps.  He is using Mucinex.  Cannot take Robitussin-DM due to being on dialysis. He also is being set up to get a CPAP on Monday.  Review of Systems     Objective:   Physical Exam Alert and in no distress otherwise not examined      Assessment & Plan:  Viral upper respiratory tract infection - Plan: benzonatate (TESSALON) 100 MG capsule  OSA (obstructive sleep apnea)  Chronic renal failure, stage 4 (severe) (HCC)  Polyp of colon, unspecified part of colon, unspecified type I explained that I did not think it was necessary to place him on an antibiotic.  I will give him a cough suppressant which seems to be his main issue.  Cautioned that he needs to check with dialysis to make sure this medication is okay for him to be on.  Otherwise he will call me Monday to let me know how he is doing.

## 2019-07-19 ENCOUNTER — Telehealth: Payer: Self-pay

## 2019-07-19 NOTE — Telephone Encounter (Signed)
  Follow up Call-  Call back number 07/17/2019  Post procedure Call Back phone  # [phone]  [phone]  Permission to leave phone message Yes  Some recent data might be hidden     Patient questions:  Do you have a fever, pain , or abdominal swelling? No. Pain Score  0 *  Have you tolerated food without any problems? Yes.    Have you been able to return to your normal activities? Yes.    Do you have any questions about your discharge instructions: Diet   No. Medications  No. Follow up visit  No.  Do you have questions or concerns about your Care? No.  Actions: * If pain score is 4 or above: No action needed, pain <4.  1. Have you developed a fever since your procedure? no  2.   Have you had an respiratory symptoms (SOB or cough) since your procedure? no 3.   Have you tested positive for COVID 19 since your procedure no  4.   Have you had any family members/close contacts diagnosed with the COVID 19 since your procedure?  no   If yes to any of these questions please route to Joylene John, RN and Alphonsa Gin, Therapist, sports.

## 2019-07-22 ENCOUNTER — Telehealth: Payer: Self-pay | Admitting: Family Medicine

## 2019-07-22 DIAGNOSIS — J069 Acute upper respiratory infection, unspecified: Secondary | ICD-10-CM

## 2019-07-22 MED ORDER — AMOXICILLIN-POT CLAVULANATE 875-125 MG PO TABS: 1.0000 | ORAL_TABLET | Freq: Two times a day (BID) | ORAL | 0 refills | Status: DC

## 2019-07-22 NOTE — Telephone Encounter (Signed)
Pt called and said he is still feeling bad, he is coughing up mucus, and is getting short of breath. He is also sneezing. Pt wants to see if he could get an antibiotic or a different medication sent to the [facility] on [address]

## 2019-07-22 NOTE — Telephone Encounter (Signed)
Let him know I called in an antibiotic and have him check with dialysis

## 2019-07-22 NOTE — Telephone Encounter (Signed)
He called stating he is still having difficulty with cough and congestion. I will call in Augmentin. I will have him check with dialysis to make sure this is okay.

## 2019-07-22 NOTE — Telephone Encounter (Signed)
Pt was advised and will call back when done to let us know how he is feeling. [place]

## 2019-07-24 ENCOUNTER — Encounter: Payer: Self-pay | Admitting: Gastroenterology

## 2019-07-26 ENCOUNTER — Other Ambulatory Visit: Payer: Self-pay

## 2019-07-26 ENCOUNTER — Ambulatory Visit (INDEPENDENT_AMBULATORY_CARE_PROVIDER_SITE_OTHER): Payer: 59 | Admitting: Family Medicine

## 2019-07-26 ENCOUNTER — Ambulatory Visit
Admission: RE | Admit: 2019-07-26 | Discharge: 2019-07-26 | Disposition: A | Discharge status: Home / Self Care | Payer: 59 | Source: Ambulatory Visit | Attending: Family Medicine | Admitting: Family Medicine

## 2019-07-26 VITALS — BP 114/87 | Temp 98.6°F | Wt 295.0 lb

## 2019-07-26 DIAGNOSIS — J209 Acute bronchitis, unspecified: Secondary | ICD-10-CM

## 2019-07-26 DIAGNOSIS — N184 Chronic kidney disease, stage 4 (severe): Secondary | ICD-10-CM | POA: Diagnosis not present

## 2019-07-26 LAB — CBC AND DIFFERENTIAL: Hemoglobin: 9.2 — AB (ref 13.5–17.5)

## 2019-07-26 MED ORDER — ALBUTEROL SULFATE HFA 108 (90 BASE) MCG/ACT IN AERS: 2.0000 | INHALATION_SPRAY | Freq: Four times a day (QID) | RESPIRATORY_TRACT | 0 refills | Status: DC | PRN

## 2019-07-26 NOTE — Progress Notes (Addendum)
   Subjective:    Patient ID: Devon Cook, male    DOB: 1961-01-26, 59 y.o.   MRN: [id number]  HPI Documentation for virtual telephone encounter. Documentation for virtual audio and video telecommunications through [place] encounter: The patient was located at home. The provider was located in the office. The patient did consent to this visit and is aware of possible charges through their insurance for this visit.  The other persons participating in this telemedicine service were none. This virtual service is not related to other E/M service within previous 7 days. He was seen on January 14 with a 1 week history of chest congestion, dry cough, sneezing, rhinorrhea.  He did have a negative Covid test.  He was treated initially conservatively however on the 18th I did call in Augmentin.  He continues to have difficulty with cough and wheezing but no fever, chills, malaise or fatigue.  He states that without the cough he would feel roughly 80% better. He gets renal dialysis and did have that yesterday.   Review of Systems     Objective:   Physical Exam Alert and coughing while talking to him but did not appear short of breath.       Assessment & Plan:  Acute bronchitis, unspecified organism - Plan: DG Chest 2 View, albuterol (VENTOLIN HFA) 108 (90 Base) MCG/ACT inhaler  Chronic renal failure, stage 4 (severe) (HCC) We will check on the blood work from dialysis from yesterday.  Hopefully the Ventolin will help with his coughing. The x-ray was negative.  He will keep me informed as to how he is doing on the Ventolin.

## 2019-07-29 ENCOUNTER — Telehealth: Payer: Self-pay | Admitting: Family Medicine

## 2019-07-29 DIAGNOSIS — J069 Acute upper respiratory infection, unspecified: Secondary | ICD-10-CM

## 2019-07-29 MED ORDER — CIPROFLOXACIN HCL 500 MG PO TABS: 500.0000 mg | ORAL_TABLET | Freq: Two times a day (BID) | ORAL | 0 refills | Status: DC

## 2019-07-29 MED ORDER — BENZONATATE 100 MG PO CAPS: 100.0000 mg | ORAL_CAPSULE | Freq: Two times a day (BID) | ORAL | 0 refills | Status: DC | PRN

## 2019-07-29 NOTE — Telephone Encounter (Signed)
He is still having difficulty with cough.  He called asking for Cipro which apparently works better for him.  I will also given Tessalon Perles.  If he continues to have difficulty, further evaluation and treatment will be needed and possible referral.

## 2019-07-29 NOTE — Telephone Encounter (Signed)
Pt called and said the inhaler that was prescribed to him over the weekend isnt working and wants to see if he could have a cough medicine called in and also the antibiotic Cipro because that seems to work better for him.

## 2019-08-01 ENCOUNTER — Other Ambulatory Visit: Payer: Self-pay | Admitting: Family Medicine

## 2019-08-01 ENCOUNTER — Telehealth: Payer: Self-pay | Admitting: Family Medicine

## 2019-08-01 MED ORDER — HYDROCOD POLST-CPM POLST ER 10-8 MG/5ML PO SUER: 5.0000 mL | Freq: Two times a day (BID) | ORAL | 0 refills | Status: DC | PRN

## 2019-08-01 NOTE — Telephone Encounter (Signed)
Pt called back and states that the tessalon pearls are not helping his cough he wants to know if he can get a cough syrup, states the cipro is helping,. But the cough is horrible, it is causing spasms/ [facility]  - [place] and pt can be reached at [phone]

## 2019-08-01 NOTE — Telephone Encounter (Signed)
Called pt and let him know that rx for cough syrup was called into the pharmacy pt called back and rx is out of stock at [facility] [address] [facility] if it could be switched there, I called the pharmacy at [place] and cancled the rd there.

## 2019-08-05 ENCOUNTER — Other Ambulatory Visit: Payer: Self-pay | Admitting: Urology

## 2019-08-20 ENCOUNTER — Telehealth: Payer: Self-pay | Admitting: Family Medicine

## 2019-08-20 NOTE — Telephone Encounter (Signed)
Pt called requesting refill on antibiotics ,states he was getting better He states he was off antibiotics about 1 1/2 weeks and had to be out in the weather on Friday and is now getting worse again. He has appt with you on Monday for follow up    [place]

## 2019-08-21 ENCOUNTER — Encounter: Payer: Self-pay | Admitting: Family Medicine

## 2019-08-21 NOTE — Telephone Encounter (Signed)
Pt called again about this issue and is requesting Cipro

## 2019-08-21 NOTE — Telephone Encounter (Signed)
See Dr. Lanice Shirts message.  If this is a respiratory issue, have him consider going for repeat Covid testing if this has not been done  If his symptoms are mainly cough and respiratory tract symptoms, another option is in person eval at the respiratory clinic.  (we can refer)  I was not involved in his initial evaluation so not sure what the current symptoms are but it sounds like he either needs a virtual consult or in person visit at respiratory clinic.  Get more information and we can go from there

## 2019-08-21 NOTE — Telephone Encounter (Signed)
With everything that is going on with him, I am not comfortable with calling in medication in.  If he wants to be seen sooner it set up with someone else

## 2019-08-22 NOTE — Telephone Encounter (Signed)
Spoke to patient and he stated he has a COVID test coming up in 2 weeks for a procedure he has to have. He stated he does not feel this is COVID he feels it's a chest cold. He did not want to schedule a virtual with another provider. Stated he will wait til Monday and he has a virtual scheduled with Dr. Redmond School and other appointments between now and then.

## 2019-08-26 ENCOUNTER — Ambulatory Visit (INDEPENDENT_AMBULATORY_CARE_PROVIDER_SITE_OTHER): Payer: 59 | Admitting: Family Medicine

## 2019-08-26 ENCOUNTER — Other Ambulatory Visit: Payer: Self-pay

## 2019-08-26 ENCOUNTER — Encounter: Payer: Self-pay | Admitting: Family Medicine

## 2019-08-26 VITALS — Temp 98.7°F | Wt 295.0 lb

## 2019-08-26 DIAGNOSIS — Z992 Dependence on renal dialysis: Secondary | ICD-10-CM

## 2019-08-26 DIAGNOSIS — N186 End stage renal disease: Secondary | ICD-10-CM

## 2019-08-26 DIAGNOSIS — F4329 Adjustment disorder with other symptoms: Secondary | ICD-10-CM | POA: Diagnosis not present

## 2019-08-26 DIAGNOSIS — J209 Acute bronchitis, unspecified: Secondary | ICD-10-CM

## 2019-08-26 MED ORDER — CIPROFLOXACIN HCL 500 MG PO TABS: 500.0000 mg | ORAL_TABLET | Freq: Two times a day (BID) | ORAL | 0 refills | Status: DC

## 2019-08-26 MED ORDER — ALPRAZOLAM 0.25 MG PO TABS: 0.2500 mg | ORAL_TABLET | Freq: Every day | ORAL | 1 refills | Status: DC | PRN

## 2019-08-26 NOTE — Progress Notes (Signed)
   Subjective:    Patient ID: Devon Cook, male    DOB: 1960-12-10, 59 y.o.   MRN: [id number]  HPI Documentation for virtual telephone encounter.  Documentation for virtual audio and video telecommunications through [place] encounter: The patient was located at home. The provider was located in the office. The patient did consent to this visit and is aware of possible charges through their insurance for this visit. The other persons participating in this telemedicine service were none. This virtual service is not related to other E/M service within previous 7 days. He had a visit on January 22 and was treated with Augmentin for the bronchitis.  It did not work and he was switched to Cipro which she states got him roughly 85% better.  Then he spent time visiting various doctors office and states that over the last several weeks he has had increasing coughing and congestion as well as some drainage purulent material from his eyes.  No fever, chills, malaise or fatigue.  He was apparently set up to see pulmonary by the dialysis unit and he has questions concerning that. He continues in dialysis and is having some difficulty with his catheter and the fistula.     Review of Systems     Objective:   Physical Exam Alert and in no distress.  No visual signs noted.  Breathing seems to be normal.      Assessment & Plan:  Acute bronchitis, unspecified organism - Plan: ciprofloxacin (CIPRO) 500 MG tablet  Stress and adjustment reaction - Plan: ALPRAZolam (XANAX) 0.25 MG tablet  ESRD on dialysis ([facility]) I will give him a 10-day course of Cipro.  He is to call me at the end that timeframe to let me know how he is doing.  I explained that we should hold off on seeing pulmonary since I am going to treat him for a longer period of time.  He was comfortable with that as he was not interested in going to pulmonary. Xanax renewed as he does occasionally need help with his anxiety. I discussed the  dialysis with him in regard to the catheter versus fistula.  Explained that fistula is a much better way of handling this.  If he does find someone that does a better job of taking him then, definitely that that be known to them.

## 2019-08-28 ENCOUNTER — Other Ambulatory Visit: Payer: Self-pay

## 2019-08-28 ENCOUNTER — Ambulatory Visit: Payer: 59 | Admitting: Pulmonary Disease

## 2019-08-28 ENCOUNTER — Encounter: Payer: Self-pay | Admitting: Pulmonary Disease

## 2019-08-28 DIAGNOSIS — G4733 Obstructive sleep apnea (adult) (pediatric): Secondary | ICD-10-CM | POA: Insufficient documentation

## 2019-08-28 DIAGNOSIS — J209 Acute bronchitis, unspecified: Secondary | ICD-10-CM | POA: Diagnosis not present

## 2019-08-28 NOTE — Assessment & Plan Note (Signed)
severe obstructive sleep apnea  Poor compliance which may be due to acute cough but also due to poor fitting full facemask which he is not tolerating, high cardiovascular risk especially given ESRD  We will send prescription to [facility] for AirFit N 30 nasal cradle mask which he can try.  If this does not work call us back and we can try different type of smaller full facemask -AirFit F30   The pathophysiology of obstructive sleep apnea , it's cardiovascular consequences & modes of treatment including CPAP were discused with the patient in detail & they evidenced understanding. Weight loss encouraged, compliance with goal of at least 4-6 hrs every night is the expectation. Advised against medications with sedative side effects Cautioned against driving when sleepy - understanding that sleepiness will vary on a day to day basis

## 2019-08-28 NOTE — Assessment & Plan Note (Signed)
Cough may have been related to bronchitis and is improved about 80% with antibiotics  -no more medications required  May consider spirometry in the future. Smoking cessation emphasized

## 2019-08-28 NOTE — Patient Instructions (Signed)
Cough may have been related to bronchitis and is improved -no more medications required  You have severe obstructive sleep apnea -stop breathing 100 times an hour on your sleep study We will send prescription to Devon Cook for AirFit N 30 nasal cradle mask which he can try.  If this does not work call us back and we can try different type of smaller full facemask

## 2019-08-28 NOTE — Progress Notes (Signed)
Subjective:    Patient ID: Devon Cook, male    DOB: [birth year], 59 y.o.   MRN: [id number]  HPI   Chief Complaint  Patient presents with  . Consult    Patient is here for a cough that started about a month ago. Cough is sometimes dry and sometimes productive. Patient is also wheezing.     59 year old smoker  presents for evaluation of acute cough for 3 weeks with wheezing. He has ESRD on dialysis but was related to bladder outlet obstruction and hydronephrosis from prostate cancer that was diagnosed in 2019.  Started on dialysis 05/2019 and is being dialyzed via right IJ permacath while fistula is maturing. He developed cough about 3 weeks ago, no URI symptoms at onset, no fevers or chills.  His last Covid test was - 07/15/2019.  He was initially given amoxicillin without much relief and then given ciprofloxacin and now he feels almost 80% improved.  He is not used any over-the-counter cough medications except Robitussin-DM Medication review shows amlodipine for blood pressure   He would also like to be evaluated for OSA.  I reviewed his home sleep test from 12/20 showing severe OSA.  He has  been started on CPAP but has a hard time adjusting to this Epworth sleepiness score is 8.  Bedtime is around midnight, he sleeps on his side with 1 pillow, dialysis days [Tuesday/Thursday/Saturday] he has to wake up around 6 AM and feels sleepy all day and tired, on nondialysis day, he stays in bed until 9 AM and feels more refreshed There is no history suggestive of cataplexy, sleep paralysis or parasomnias   Significant tests/ events reviewed  Chest x-ray from 1/22 was reviewed which shows elevated right hemidiaphragm, on review of prior imaging this dates back to 2007  08/2018 HST showed severe OSA with AHI 100/hour, lowest desats of 64%   Past Medical History:  Diagnosis Date  . Anemia, chronic renal failure    followed by dr Lorrene Reid--- dx 05/ 2019---  treated with Iron infusions and  procrit  . Bilateral hydronephrosis    malignant-- chronic  . Blood transfusion without reported diagnosis    per pt  . ED (erectile dysfunction)   . Edema of both lower extremities    05-22-2019  per pt started 3 weeks ago, pt is ESRD,  hemodialysis was started  . End stage renal failure on dialysis [facility]) hemodialysis T/TH/S @ [facility]---started first week of Nov 2020   nephrologist-- dr c. 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 GERD (gastroesophageal reflux disease)   . Gout    05-22-2019  per pt unsure when last episode   . Herpes genitalis in men   . History of hepatitis C 2017   dx 11/ 207-- treated with Harvoni (in epic 04/ 2019 lab result non-detectable)  . History of lower GI bleeding 11/2017  . Hypertension   . Metabolic acidosis   . Prostate 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 [facility]) urologist-- dr Tresa Moore   dx 06/ 2019-- advanced w/ mets to bone and pelvic lymphadeopathy  . S/P arteriovenous (AV) fistula creation    03-15-2019  by dr Carlis Abbott, left branhiocephallic  . Secondary hyperparathyroidism of renal origin ([place])   . Sleep apnea    wears CPAP  . Wears glasses       Review of Systems  Constitutional: negative for anorexia, fevers and sweats  Eyes: negative for irritation, redness and visual disturbance  Ears, nose,  mouth, throat, and face: negative for earaches, epistaxis, nasal congestion and sore throat  Respiratory: negative for dyspnea on exertion, sputum and wheezing  Cardiovascular: negative for chest pain, dyspnea, lower extremity edema, orthopnea, palpitations and syncope  Gastrointestinal: negative for abdominal pain, constipation, diarrhea, melena, nausea and vomiting  Genitourinary:negative for dysuria, frequency and hematuria  Hematologic/lymphatic: negative for bleeding, easy bruising and lymphadenopathy  Musculoskeletal:negative for arthralgias, muscle weakness and stiff joints  Neurological: negative  for coordination problems, gait problems, headaches and weakness  Endocrine: negative for diabetic symptoms including polydipsia, polyuria and weight loss     Objective:   Physical Exam  Gen. Pleasant, obese, in no distress, normal affect ENT - no pallor,icterus, no post nasal drip, class 2 airway Neck: No JVD, no thyromegaly, no carotid bruits, RT IJ permacath Lungs: no use of accessory muscles, no dullness to percussion, decreased without rales or rhonchi  Cardiovascular: Rhythm regular, heart sounds  normal, no murmurs or gallops, no peripheral edema Abdomen: soft and non-tender, no hepatosplenomegaly, BS normal. Musculoskeletal: No deformities, no cyanosis or clubbing, fatty tumor on his back Neuro:  alert, non focal, no tremors       Assessment & Plan:

## 2019-08-30 ENCOUNTER — Other Ambulatory Visit: Payer: Self-pay

## 2019-08-30 ENCOUNTER — Encounter (HOSPITAL_BASED_OUTPATIENT_CLINIC_OR_DEPARTMENT_OTHER): Payer: Self-pay | Admitting: Urology

## 2019-08-30 NOTE — Progress Notes (Addendum)
ADDENDUM:  Chart reviewed by anesthesia, Konrad Felix PA (including pt's lov with pulm. 09-04-2019),  Ok to proceed?   Spoke w/ via phone for pre-op interview--- PT Lab needs dos----  Istat 8             Lab results------ current ekg/ cxr in epic/ chart COVID test ------ 09-04-2019 @ 0835 Arrive at ------- 0530 NPO after ------ MN Medications to take morning of surgery ----- Allopurinol and Protonix w/ sips of water Diabetic medication ----- n/a Patient Special Instructions ----- n/a Pre-Op special Istructions ----- n/a  Patient verbalized understanding of instructions that were given at this phone interview. Patient denies chest pain, fever, cough a this phone interview.   Anesthesia Review:   ESRD w/ HD secondary to uropathy obstructions due to prostate cancer.  Dialysis @ [facility]), T/TH/S.  Pt dx 07-18-2019 with acute bronchitis by his pcp given antibiotic / cough meds.  Today per pt chart another antibiotic on 08-26-2019 but denies cough/ congestion but did have wheezing / sob this morning which was relieved with rescue inhaler.  PCP:  Dr Redmond School (lov 08-26-2019 epic) Cardiologist :  no Vascular:  Dr Carlis Abbott (lov 06-21-2019 epic) Nephrologist:  Dr Hollie Salk @ [place] Chest x-ray : 07-26-2019 epic EKG :  03-15-2019 epic Echo : no Stress test:  no Cardiac Cath :  no Sleep Study/ CPAP : YES/ Yes, however, per pt has not been using due to mask not fitting , waiting on new mask Fasting Blood Sugar :    / Checks Blood Sugar -- times a day:    N/A Blood Thinner/ Instructions /Last Dose: NO ASA / Instructions/ Last Dose :   ASA 81mg /  Per pt was given any instructions to stop .

## 2019-09-02 ENCOUNTER — Telehealth: Payer: Self-pay

## 2019-09-02 MED ORDER — LIDOCAINE-PRILOCAINE 2.5-2.5 % EX CREA: 1.0000 "application " | TOPICAL_CREAM | CUTANEOUS | 5 refills | Status: DC | PRN

## 2019-09-02 NOTE — Telephone Encounter (Signed)
Let him know that I called it in 

## 2019-09-02 NOTE — Telephone Encounter (Signed)
Pt was advised Kh 

## 2019-09-02 NOTE — Telephone Encounter (Signed)
Pt. Called stating that he needed you to call in a new prescription for emla numbing cream he is going to start doing dialysis and needs this numbing cream asap. Pt. Last apt.  08/26/19 [facility] [address] And if we could give him a call once its sent in.

## 2019-09-03 ENCOUNTER — Telehealth: Payer: Self-pay | Admitting: Pulmonary Disease

## 2019-09-03 NOTE — Telephone Encounter (Signed)
Spoke with pt, he states his oxygen level has gone done in the 70's but doesn't feel SOB. He went to the chiropractor yesterday and they told him his oxygen was 84%. I advised him to come in to be evaluated and if he had any SOB, he should go to the ED. I made him an appt on 09/04/2019 to evaluate low saturations. Nothing further is needed.

## 2019-09-04 ENCOUNTER — Encounter: Payer: Self-pay | Admitting: Adult Health

## 2019-09-04 ENCOUNTER — Other Ambulatory Visit (HOSPITAL_COMMUNITY)
Admission: RE | Admit: 2019-09-04 | Discharge: 2019-09-04 | Disposition: A | Discharge status: Home / Self Care | Payer: 59 | Source: Ambulatory Visit | Attending: Urology | Admitting: Urology

## 2019-09-04 ENCOUNTER — Ambulatory Visit (INDEPENDENT_AMBULATORY_CARE_PROVIDER_SITE_OTHER): Payer: 59 | Admitting: Adult Health

## 2019-09-04 ENCOUNTER — Other Ambulatory Visit: Payer: Self-pay

## 2019-09-04 VITALS — BP 122/66 | HR 73 | Temp 97.4°F | Ht 73.0 in | Wt 305.2 lb

## 2019-09-04 DIAGNOSIS — Z20822 Contact with and (suspected) exposure to covid-19: Secondary | ICD-10-CM | POA: Insufficient documentation

## 2019-09-04 DIAGNOSIS — J209 Acute bronchitis, unspecified: Secondary | ICD-10-CM | POA: Diagnosis not present

## 2019-09-04 DIAGNOSIS — Z01812 Encounter for preprocedural laboratory examination: Secondary | ICD-10-CM | POA: Diagnosis present

## 2019-09-04 DIAGNOSIS — Z6841 Body Mass Index (BMI) 40.0 and over, adult: Secondary | ICD-10-CM

## 2019-09-04 DIAGNOSIS — G4733 Obstructive sleep apnea (adult) (pediatric): Secondary | ICD-10-CM | POA: Diagnosis not present

## 2019-09-04 LAB — SARS CORONAVIRUS 2 (TAT 6-24 HRS): SARS Coronavirus 2: NEGATIVE

## 2019-09-04 NOTE — Assessment & Plan Note (Signed)
Healthy weight loss 

## 2019-09-04 NOTE — Assessment & Plan Note (Addendum)
Resolved..  CXR with no acute process. 07/2019 .  No exertional desats . O2 sats normal at rest and act.   Plan  Patient Instructions  Change to Dream Wear nasal .  Wear CPAP At bedtime  All night .  Work on healthy weight .  Do not drive if sleepy.  Follow up with Dr. Elsworth Soho  Or Brilee Port NP in 6 weeks and As needed

## 2019-09-04 NOTE — Progress Notes (Signed)
@Patient  ID: Devon Cook, male    DOB: 1961/06/20, 59 y.o.   MRN: [id number]  Chief Complaint  Patient presents with  . Follow-up    low oxygen levels at home    Referring provider: [name], MD  HPI: 59 yo smoker seen for pulmonary consult cough on 08/28/19  Medical history significant for ESRD on HD related to bladder outlet obstruction and hydronephrosis from prostate cancer dx in 2019 .  Started on Dialysis 05/2019 .  Diagnosed with Severe OSA  06/2019 .   TEST/EVENTS :  08/2018 HST showed severe OSA with AHI 100/hour, lowest desats of 64%  09/04/2019 Follow up ; Cough , OSA and Low oxygen levels.  Patient was seen 2 weeks ago for pulmonary consult for ongoing cough felt secondary to bronchitis . He was given antibiotics initially . Says cough has resolved now. Chest xray in January 2021 showed no acute process , linear scarring in RUL . He is feeling better. Denies dyspnea. Oxygen level has been low this week per patient . Today in office Oxygen level on room air at 97-98% room air walking and 98% at rest on room air.    Recently diagnosed with severe OSA , started on CPAP . Says trying to wear but mask is not working. Wants to try new mask . We looked at several different masks , he likes the dream wear nasal mask.   Recently started on hemodialysis for ESRD . Says he is anemic , Hemoglobin at 7.6 .    No Known Allergies  Immunization History  Administered Date(s) Administered  . DTaP 05/12/2001  . Hepatitis B, adult 07/11/2019, 08/10/2019  . Influenza,inj,Quad PF,6-35 Mos 05/28/2019  . PPD Test 05/03/1999  . Tdap 01/09/2012    Past Medical History:  Diagnosis Date  . Anemia, chronic renal failure    followed by dr Hollie Salk--- dx 05/ 2019---  treated with Iron infusions and procrit  . Bilateral hydronephrosis    malignant-- chronic  . Bronchitis    dx 07-18-2019 by pt's pcp  (08-30-2019 per pt no coughing or congestion today but is had wheezing/sob  this  morning which was relieved with albuteral inhaler)  . Chronic gastritis    07-17-2019 and duodenitis  . ED (erectile dysfunction)   . Edema of both lower extremities    08-30-2019  per pt edema has much improved since started hemodialysis  . 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 GERD (gastroesophageal reflux disease)   . Gout    08-30-2019  per pt unsure when last episode   . Herpes genitalis in men   . History of adenomatous polyp of colon   . History of hepatitis C 2017   dx 11/ 207-- treated with Harvoni (in epic 04/ 2019 lab result non-detectable)  . History of lower GI bleeding 11/2017  . Hypertension   . OSA (obstructive sleep apnea)    severe osa w/ nocturnal hyoxemia per study 06-19-2019,  pt given cpap ( however on 08-30-2019 per pt having issues with mask fitting and on a new one so he not using the cpap)  . Prostate 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 [facility]) urologist-- dr Tresa Moore   dx 06/ 2019-- advanced w/ mets to bone and pelvic lymphadeopathy  . S/P arteriovenous (AV) fistula creation    03-15-2019  by dr Carlis Abbott, left branhiocephallic  . Secondary hyperparathyroidism of renal origin ([place])   . Wears glasses     Tobacco History: Social History   Tobacco Use  Smoking Status Former Smoker  . Packs/day: 0.50  . Years: 6.00  . Pack years: 3.00  . Types: Cigarettes, Pipe, Cigars  . Quit date: 07/28/2019  . Years since quitting: 0.1  Smokeless Tobacco Never Used  Tobacco Comment   occ cigar   Counseling given: Not Answered Comment: occ cigar   Outpatient Medications Prior to Visit  Medication Sig Dispense Refill  . albuterol (VENTOLIN HFA) 108 (90 Base) MCG/ACT inhaler Inhale 2 puffs into the lungs every 6 (six) hours as needed for wheezing or shortness of breath. 18 g 0  .  allopurinol (ZYLOPRIM) 100 MG tablet TAKE 1 TABLET BY MOUTH ONCE DAILY (Patient taking differently: Take 100 mg by mouth 2 (two) times daily. ) 30 tablet 0  . ALPRAZolam (XANAX) 0.25 MG tablet Take 1 tablet (0.25 mg total) by mouth daily as needed for anxiety. 30 tablet 1  . amLODipine (NORVASC) 10 MG tablet Take 10 mg by mouth at bedtime.     Marland Kitchen aspirin EC 81 MG tablet Take 81 mg by mouth daily.    . benzonatate (TESSALON) 100 MG capsule Take 1 capsule (100 mg total) by mouth 2 (two) times daily as needed for cough. 20 capsule 0  . chlorpheniramine-HYDROcodone (TUSSIONEX PENNKINETIC ER) 10-8 MG/5ML SUER Take 5 mLs by mouth every 12 (twelve) hours as needed for cough. 140 mL 0  . ciprofloxacin (CIPRO) 500 MG tablet Take 1 tablet (500 mg total) by mouth 2 (two) times daily. 20 tablet 0  . epoetin alfa (EPOGEN,PROCRIT) 2000 UNIT/ML injection Inject 2,000 Units into the vein every 14 (fourteen) days.     . ferric citrate (AURYXIA) 1 GM 210 MG(Fe) tablet Take 420 mg by mouth 3 (three) times daily with meals.     Marland Kitchen guaiFENesin-dextromethorphan (ROBITUSSIN DM) 100-10 MG/5ML syrup Take 5 mLs by mouth every 4 (four) hours as needed for cough.    . lidocaine-prilocaine (EMLA) cream Apply 1 application topically as needed. 30 g 5  . methocarbamol (ROBAXIN) 750 MG tablet Take 750 mg by mouth at bedtime as needed for muscle spasms.    . Multiple Vitamins-Minerals (MULTIVITAMIN WITH MINERALS) tablet Take 1 tablet by mouth daily.      Marland Kitchen oxyCODONE-acetaminophen (PERCOCET) 5-325 MG tablet Take 1 tablet by mouth every 8 (eight) hours as needed for moderate pain. From metastatic cancer 60 tablet 0  . Oxymetazoline HCl (VICKS SINEX NA) Place 1 spray into the nose daily as needed (congestion).    . pantoprazole (PROTONIX) 40 MG tablet Take 1 tablet (40 mg total) by mouth 2 (two) times daily. 112 tablet 0   No facility-administered medications prior to visit.     Review of Systems:   Constitutional:   No  weight  loss, night sweats,  Fevers, chills, fatigue, or  lassitude.  HEENT:   No headaches,  Difficulty swallowing,  Tooth/dental problems, or  Sore throat,                No sneezing, itching, ear ache, nasal congestion, post nasal drip,   CV:  No chest pain,  Orthopnea, PND, swelling in lower extremities, anasarca, dizziness, palpitations, syncope.   GI  No heartburn, indigestion, abdominal pain, nausea, vomiting, diarrhea, change in bowel habits, loss of appetite, bloody stools.   Resp: No shortness of breath with exertion  or at rest.  No excess mucus, no productive cough,  No non-productive cough,  No coughing up of blood.  No change in color of mucus.  No wheezing.  No chest wall deformity  Skin: no rash or lesions.  GU: no dysuria, change in color of urine, no urgency or frequency.  No flank pain, no hematuria   MS:  No joint pain or swelling.  No decreased range of motion.  No back pain.    Physical Exam  BP 122/66 (BP Location: Left Arm, Cuff Size: Normal)   Pulse 73   Temp (!) 97.4 F (36.3 C) (Temporal)   Ht 6\' 1"  (1.854 m)   Wt (!) 305 lb 3.2 oz (138.4 kg)   SpO2 98% Comment: RA  BMI 40.27 kg/m   GEN: A/Ox3; pleasant , NAD, BMI 40    HEENT:  [place]/AT,  , NOSE-clear, THROAT-clear, no lesions, no postnasal drip or exudate noted.   NECK:  Supple w/ fair ROM; no JVD; normal carotid impulses w/o bruits; no thyromegaly or nodules palpated; no lymphadenopathy.    RESP  Clear  P & A; w/o, wheezes/ rales/ or rhonchi. no accessory muscle use, no dullness to percussion  CARD:  RRR, no m/r/g, no peripheral edema, pulses intact, no cyanosis or clubbing. Left arm AV fistula   GI:   Soft & nt; nml bowel sounds; no organomegaly or masses detected.   Musco: Warm bil, no deformities or joint swelling noted.   Neuro: alert, no focal deficits noted.    Skin: Warm, no lesions or rashes    Lab Results:   BMET  BNP No results found for: BNP  ProBNP No results found for:  PROBNP  Imaging: No results found.    No flowsheet data found.  No results found for: NITRICOXIDE      Assessment & Plan:   Acute bronchitis Resolved..  CXR with no acute process. 07/2019 .  No exertional desats . O2 sats normal at rest and act.   Plan  Patient Instructions  Change to Dream Wear nasal .  Wear CPAP At bedtime  All night .  Work on healthy weight .  Do not drive if sleepy.  Follow up with Dr. Elsworth Soho  Or Jaqulyn Chancellor NP in 6 weeks and As needed       Morbid obesity with BMI of 40.0-44.9, adult ([place]) Healthy weight loss.   OSA (obstructive sleep apnea) Very severe OSA - having trouble with mask fit  Change to Dream Wear Nasal Mask .   Plan  Patient Instructions  Change to Dream Wear nasal .  Wear CPAP At bedtime  All night .  Work on healthy weight .  Do not drive if sleepy.  Follow up with Dr. Elsworth Soho  Or Rusty Villella NP in 6 weeks and As needed          Rexene Edison, NP 09/04/2019

## 2019-09-04 NOTE — Patient Instructions (Addendum)
Change to Dream Wear nasal .  Wear CPAP At bedtime  All night .  Work on healthy weight .  Do not drive if sleepy.  Follow up with Dr. Elsworth Soho  Or Gomer France NP in 6 weeks and As needed

## 2019-09-04 NOTE — Assessment & Plan Note (Signed)
Very severe OSA - having trouble with mask fit  Change to Dream Wear Nasal Mask .   Plan  Patient Instructions  Change to Dream Wear nasal .  Wear CPAP At bedtime  All night .  Work on healthy weight .  Do not drive if sleepy.  Follow up with Dr. Elsworth Soho  Or Ismahan Lippman NP in 6 weeks and As needed

## 2019-09-05 ENCOUNTER — Telehealth: Payer: Self-pay

## 2019-09-05 NOTE — Anesthesia Preprocedure Evaluation (Addendum)
Anesthesia Evaluation  Patient identified by MRN, date of birth, ID band Patient awake    Reviewed: Allergy & Precautions, NPO status , Patient's Chart, lab work & pertinent test results  History of Anesthesia Complications Negative for: history of anesthetic complications  Airway Mallampati: II  TM Distance: >3 FB Neck ROM: Full    Dental  (+) Dental Advisory Given, Teeth Intact   Pulmonary sleep apnea (noncompliant with CPAP) , Patient abstained from smoking., former smoker,    Pulmonary exam normal        Cardiovascular hypertension, Pt. on medications Normal cardiovascular exam     Neuro/Psych negative neurological ROS  negative psych ROS   GI/Hepatic GERD  Controlled,(+) Hepatitis - (s/p Harvoni), C  Endo/Other  Morbid obesity  Renal/GU ESRF and DialysisRenal disease    Prostate cancer w/ metastases     Musculoskeletal  Gout    Abdominal (+) + obese,   Peds  Hematology  (+) anemia ,   Anesthesia Other Findings HSV Covid neg 09/04/19  Reproductive/Obstetrics                            Anesthesia Physical Anesthesia Plan  ASA: III  Anesthesia Plan: General   Post-op Pain Management:    Induction: Intravenous  PONV Risk Score and Plan: 2 and Treatment may vary due to age or medical condition, Ondansetron and Dexamethasone  Airway Management Planned: LMA  Additional Equipment: None  Intra-op Plan:   Post-operative Plan: Extubation in OR  Informed Consent: I have reviewed the patients History and Physical, chart, labs and discussed the procedure including the risks, benefits and alternatives for the proposed anesthesia with the patient or authorized representative who has indicated his/her understanding and acceptance.     Dental advisory given  Plan Discussed with: CRNA and Anesthesiologist  Anesthesia Plan Comments:        Anesthesia Quick Evaluation

## 2019-09-05 NOTE — Telephone Encounter (Signed)
Error

## 2019-09-05 NOTE — Telephone Encounter (Signed)
Pt want to know if labs had been sent over dialysis . Pt is concerned about his Iron being low. [place]

## 2019-09-06 ENCOUNTER — Ambulatory Visit (HOSPITAL_BASED_OUTPATIENT_CLINIC_OR_DEPARTMENT_OTHER): Payer: 59 | Admitting: Physician Assistant

## 2019-09-06 ENCOUNTER — Encounter (HOSPITAL_BASED_OUTPATIENT_CLINIC_OR_DEPARTMENT_OTHER)
Admission: RE | Disposition: A | Discharge status: Home / Self Care | Payer: Self-pay | Source: Home / Self Care | Attending: Urology

## 2019-09-06 ENCOUNTER — Telehealth: Payer: Self-pay

## 2019-09-06 ENCOUNTER — Other Ambulatory Visit: Payer: Self-pay

## 2019-09-06 ENCOUNTER — Encounter (HOSPITAL_BASED_OUTPATIENT_CLINIC_OR_DEPARTMENT_OTHER): Payer: Self-pay | Admitting: Urology

## 2019-09-06 ENCOUNTER — Ambulatory Visit (HOSPITAL_BASED_OUTPATIENT_CLINIC_OR_DEPARTMENT_OTHER)
Admission: RE | Admit: 2019-09-06 | Discharge: 2019-09-06 | Disposition: A | Discharge status: Home / Self Care | Payer: 59 | Attending: Urology | Admitting: Urology

## 2019-09-06 DIAGNOSIS — Z86010 Personal history of colonic polyps: Secondary | ICD-10-CM | POA: Diagnosis not present

## 2019-09-06 DIAGNOSIS — G4733 Obstructive sleep apnea (adult) (pediatric): Secondary | ICD-10-CM | POA: Insufficient documentation

## 2019-09-06 DIAGNOSIS — Z87891 Personal history of nicotine dependence: Secondary | ICD-10-CM | POA: Diagnosis not present

## 2019-09-06 DIAGNOSIS — Z6839 Body mass index (BMI) 39.0-39.9, adult: Secondary | ICD-10-CM | POA: Insufficient documentation

## 2019-09-06 DIAGNOSIS — I12 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 stage renal disease: Secondary | ICD-10-CM | POA: Insufficient documentation

## 2019-09-06 DIAGNOSIS — N131 Hydronephrosis with ureteral stricture, not elsewhere classified: Secondary | ICD-10-CM | POA: Diagnosis not present

## 2019-09-06 DIAGNOSIS — J209 Acute bronchitis, unspecified: Secondary | ICD-10-CM

## 2019-09-06 DIAGNOSIS — Z9119 Patient's noncompliance with other medical treatment and regimen: Secondary | ICD-10-CM | POA: Diagnosis not present

## 2019-09-06 DIAGNOSIS — C7919 Secondary malignant neoplasm of other urinary organs: Secondary | ICD-10-CM | POA: Diagnosis not present

## 2019-09-06 DIAGNOSIS — M109 Gout, unspecified: Secondary | ICD-10-CM | POA: Diagnosis not present

## 2019-09-06 DIAGNOSIS — Z8619 Personal history of other infectious and parasitic diseases: Secondary | ICD-10-CM | POA: Diagnosis not present

## 2019-09-06 DIAGNOSIS — Z79899 Other long term (current) drug therapy: Secondary | ICD-10-CM | POA: Diagnosis not present

## 2019-09-06 DIAGNOSIS — Z7982 Long term (current) use of aspirin: Secondary | ICD-10-CM | POA: Diagnosis not present

## 2019-09-06 DIAGNOSIS — R7303 Prediabetes: Secondary | ICD-10-CM | POA: Insufficient documentation

## 2019-09-06 DIAGNOSIS — Z992 Dependence on renal dialysis: Secondary | ICD-10-CM | POA: Diagnosis not present

## 2019-09-06 DIAGNOSIS — C61 Malignant neoplasm of prostate: Secondary | ICD-10-CM | POA: Insufficient documentation

## 2019-09-06 DIAGNOSIS — Z8249 Family history of ischemic heart disease and other diseases of the circulatory system: Secondary | ICD-10-CM | POA: Diagnosis not present

## 2019-09-06 DIAGNOSIS — Z833 Family history of diabetes mellitus: Secondary | ICD-10-CM | POA: Insufficient documentation

## 2019-09-06 DIAGNOSIS — K219 Gastro-esophageal reflux disease without esophagitis: Secondary | ICD-10-CM | POA: Diagnosis not present

## 2019-09-06 DIAGNOSIS — D631 Anemia in chronic kidney disease: Secondary | ICD-10-CM | POA: Diagnosis not present

## 2019-09-06 DIAGNOSIS — M479 Spondylosis, unspecified: Secondary | ICD-10-CM | POA: Diagnosis not present

## 2019-09-06 DIAGNOSIS — N186 End stage renal disease: Secondary | ICD-10-CM | POA: Diagnosis not present

## 2019-09-06 DIAGNOSIS — C7951 Secondary malignant neoplasm of bone: Secondary | ICD-10-CM | POA: Diagnosis not present

## 2019-09-06 DIAGNOSIS — N2581 Secondary hyperparathyroidism of renal origin: Secondary | ICD-10-CM | POA: Insufficient documentation

## 2019-09-06 HISTORY — DX: Personal history of adenomatous and serrated colon polyps: Z86.0101

## 2019-09-06 HISTORY — PX: CYSTOSCOPY W/ URETERAL STENT PLACEMENT: SHX1429

## 2019-09-06 HISTORY — DX: Unspecified chronic gastritis without bleeding: K29.50

## 2019-09-06 HISTORY — DX: Personal history of colonic polyps: Z86.010

## 2019-09-06 HISTORY — DX: Obstructive sleep apnea (adult) (pediatric): G47.33

## 2019-09-06 LAB — POCT I-STAT, CHEM 8
BUN: 33 mg/dL — ABNORMAL HIGH (ref 6–20)
Calcium, Ion: 1.22 mmol/L (ref 1.15–1.40)
Chloride: 94 mmol/L — ABNORMAL LOW (ref 98–111)
Creatinine, Ser: 7 mg/dL — ABNORMAL HIGH (ref 0.61–1.24)
Glucose, Bld: 112 mg/dL — ABNORMAL HIGH (ref 70–99)
HCT: 30 % — ABNORMAL LOW (ref 39.0–52.0)
Hemoglobin: 10.2 g/dL — ABNORMAL LOW (ref 13.0–17.0)
Potassium: 4 mmol/L (ref 3.5–5.1)
Sodium: 138 mmol/L (ref 135–145)
TCO2: 34 mmol/L — ABNORMAL HIGH (ref 22–32)

## 2019-09-06 SURGERY — CYSTOSCOPY, WITH RETROGRADE PYELOGRAM AND URETERAL STENT INSERTION
Anesthesia: General | Site: Pelvis | Laterality: Bilateral

## 2019-09-06 MED ORDER — OXYCODONE HCL 5 MG/5ML PO SOLN
5.0000 mg | Freq: Once | ORAL | Status: DC | PRN
  Filled 2019-09-06: qty 5

## 2019-09-06 MED ORDER — DEXAMETHASONE SODIUM PHOSPHATE 10 MG/ML IJ SOLN
INTRAMUSCULAR | Status: DC | PRN
  Administered 2019-09-06: 5 mg via INTRAVENOUS

## 2019-09-06 MED ORDER — FENTANYL CITRATE (PF) 100 MCG/2ML IJ SOLN
INTRAMUSCULAR | Status: DC | PRN
  Administered 2019-09-06: 50 ug via INTRAVENOUS

## 2019-09-06 MED ORDER — ALBUTEROL SULFATE HFA 108 (90 BASE) MCG/ACT IN AERS: 2.0000 | INHALATION_SPRAY | Freq: Four times a day (QID) | RESPIRATORY_TRACT | 0 refills | Status: DC | PRN

## 2019-09-06 MED ORDER — PROPOFOL 10 MG/ML IV BOLUS
INTRAVENOUS | Status: DC | PRN
  Administered 2019-09-06: 170 mg via INTRAVENOUS

## 2019-09-06 MED ORDER — CEFTRIAXONE SODIUM 2 G IJ SOLR
INTRAMUSCULAR | Status: AC
  Filled 2019-09-06: qty 20

## 2019-09-06 MED ORDER — MIDAZOLAM HCL 2 MG/2ML IJ SOLN
INTRAMUSCULAR | Status: DC | PRN
  Administered 2019-09-06: 1 mg via INTRAVENOUS

## 2019-09-06 MED ORDER — SODIUM CHLORIDE 0.9 % IV SOLN
INTRAVENOUS | Status: DC
  Filled 2019-09-06: qty 1000

## 2019-09-06 MED ORDER — OXYCODONE-ACETAMINOPHEN 5-325 MG PO TABS: 1.0000 | ORAL_TABLET | Freq: Three times a day (TID) | ORAL | 0 refills | Status: DC | PRN

## 2019-09-06 MED ORDER — SODIUM CHLORIDE 0.9 % IV SOLN
INTRAVENOUS | Status: AC
  Filled 2019-09-06: qty 100

## 2019-09-06 MED ORDER — FENTANYL CITRATE (PF) 100 MCG/2ML IJ SOLN
INTRAMUSCULAR | Status: AC
  Filled 2019-09-06: qty 2

## 2019-09-06 MED ORDER — LIDOCAINE 2% (20 MG/ML) 5 ML SYRINGE
INTRAMUSCULAR | Status: AC
  Filled 2019-09-06: qty 5

## 2019-09-06 MED ORDER — DEXAMETHASONE SODIUM PHOSPHATE 10 MG/ML IJ SOLN
INTRAMUSCULAR | Status: AC
  Filled 2019-09-06: qty 1

## 2019-09-06 MED ORDER — FENTANYL CITRATE (PF) 100 MCG/2ML IJ SOLN
25.0000 ug | INTRAMUSCULAR | Status: DC | PRN
  Filled 2019-09-06: qty 1

## 2019-09-06 MED ORDER — ONDANSETRON HCL 4 MG/2ML IJ SOLN
4.0000 mg | Freq: Once | INTRAMUSCULAR | Status: DC | PRN
  Filled 2019-09-06: qty 2

## 2019-09-06 MED ORDER — ONDANSETRON HCL 4 MG/2ML IJ SOLN
INTRAMUSCULAR | Status: AC
  Filled 2019-09-06: qty 2

## 2019-09-06 MED ORDER — OXYCODONE HCL 5 MG PO TABS
5.0000 mg | ORAL_TABLET | Freq: Once | ORAL | Status: DC | PRN
  Filled 2019-09-06: qty 1

## 2019-09-06 MED ORDER — SODIUM CHLORIDE 0.9 % IV SOLN
1.0000 g | INTRAVENOUS | Status: AC
  Administered 2019-09-06: 2 g via INTRAVENOUS
  Filled 2019-09-06: qty 10

## 2019-09-06 MED ORDER — PROPOFOL 10 MG/ML IV BOLUS
INTRAVENOUS | Status: AC
  Filled 2019-09-06: qty 20

## 2019-09-06 MED ORDER — ONDANSETRON HCL 4 MG/2ML IJ SOLN
INTRAMUSCULAR | Status: DC | PRN
  Administered 2019-09-06: 4 mg via INTRAVENOUS

## 2019-09-06 MED ORDER — MIDAZOLAM HCL 2 MG/2ML IJ SOLN
INTRAMUSCULAR | Status: AC
  Filled 2019-09-06: qty 2

## 2019-09-06 MED ORDER — IOHEXOL 300 MG/ML  SOLN
INTRAMUSCULAR | Status: DC | PRN
  Administered 2019-09-06: 15 mL via URETHRAL

## 2019-09-06 MED ORDER — LIDOCAINE 2% (20 MG/ML) 5 ML SYRINGE
INTRAMUSCULAR | Status: DC | PRN
  Administered 2019-09-06: 60 mg via INTRAVENOUS

## 2019-09-06 SURGICAL SUPPLY — 23 items
BAG DRAIN URO-CYSTO SKYTR STRL (DRAIN) ×3 IMPLANT
BASKET LASER NITINOL 1.9FR (BASKET) IMPLANT
CATH INTERMIT  6FR 70CM (CATHETERS) ×3 IMPLANT
CLOTH BEACON ORANGE TIMEOUT ST (SAFETY) ×3 IMPLANT
FIBER LASER FLEXIVA 365 (UROLOGICAL SUPPLIES) IMPLANT
FIBER LASER TRAC TIP (UROLOGICAL SUPPLIES) IMPLANT
GLOVE BIO SURGEON STRL SZ7.5 (GLOVE) ×3 IMPLANT
GOWN STRL REUS W/TWL LRG LVL3 (GOWN DISPOSABLE) ×3 IMPLANT
GUIDEWIRE ANG ZIPWIRE 038X150 (WIRE) ×3 IMPLANT
GUIDEWIRE STR DUAL SENSOR (WIRE) IMPLANT
IV NS 1000ML (IV SOLUTION) ×3
IV NS 1000ML BAXH (IV SOLUTION) ×1 IMPLANT
IV NS IRRIG 3000ML ARTHROMATIC (IV SOLUTION) ×3 IMPLANT
KIT TURNOVER CYSTO (KITS) ×3 IMPLANT
MANIFOLD NEPTUNE II (INSTRUMENTS) ×3 IMPLANT
NS IRRIG 500ML POUR BTL (IV SOLUTION) ×3 IMPLANT
PACK CYSTO (CUSTOM PROCEDURE TRAY) ×3 IMPLANT
STENT CONTOUR 7FRX26 (STENTS) ×6 IMPLANT
SYR 10ML LL (SYRINGE) ×3 IMPLANT
TUBE CONNECTING 12'X1/4 (SUCTIONS) ×1
TUBE CONNECTING 12X1/4 (SUCTIONS) ×2 IMPLANT
TUBE FEEDING 8FR 16IN STR KANG (MISCELLANEOUS) IMPLANT
TUBING UROLOGY SET (TUBING) ×3 IMPLANT

## 2019-09-06 NOTE — Telephone Encounter (Signed)
Pt wanted to advise that he had surgery this am and when he had blood drawn his Hbc was 10.0 . Just a FYI. [place]

## 2019-09-06 NOTE — Anesthesia Procedure Notes (Signed)
Procedure Name: LMA Insertion Date/Time: 09/06/2019 7:29 AM Performed by: Suan Halter, CRNA Pre-anesthesia Checklist: Patient identified, Emergency Drugs available, Suction available and Patient being monitored Patient Re-evaluated:Patient Re-evaluated prior to induction Oxygen Delivery Method: Circle system utilized Preoxygenation: Pre-oxygenation with 100% oxygen Induction Type: IV induction Ventilation: Mask ventilation without difficulty LMA: LMA inserted LMA Size: 5.0 Number of attempts: 1 Airway Equipment and Method: Bite block Placement Confirmation: positive ETCO2 Tube secured with: Tape Dental Injury: Teeth and Oropharynx as per pre-operative assessment

## 2019-09-06 NOTE — Transfer of Care (Signed)
Immediate Anesthesia Transfer of Care Note  Patient: Devon Cook  Procedure(s) Performed: Procedure(s) (LRB): CYSTOSCOPY WITH RETROGRADE PYELOGRAM/URETERAL STENT EXCHANGE (Bilateral)  Patient Location: PACU  Anesthesia Type: General  Level of Consciousness: awake, oriented, sedated and patient cooperative  Airway & Oxygen Therapy: Patient Spontanous Breathing and Patient connected to face mask oxygen  Post-op Assessment: Report given to PACU RN and Post -op Vital signs reviewed and stable  Post vital signs: Reviewed and stable  Complications: No apparent anesthesia complications  Last Vitals:  Vitals Value Taken Time  BP 101/65 09/06/19 0755  Temp    Pulse 71 09/06/19 0758  Resp 15 09/06/19 0758  SpO2 96 % 09/06/19 0758  Vitals shown include unvalidated device data.  Last Pain:  Vitals:   09/06/19 0735  TempSrc: Oral

## 2019-09-06 NOTE — Brief Op Note (Signed)
09/06/2019  7:47 AM  PATIENT:  Devon Cook  59 y.o. male  PRE-OPERATIVE DIAGNOSIS:  MALIGNANT HYDRONEPHROSIS  POST-OPERATIVE DIAGNOSIS:  MALIGNANT HYDRONEPHROSIS  PROCEDURE:  Procedure(s) with comments: CYSTOSCOPY WITH RETROGRADE PYELOGRAM/URETERAL STENT EXCHANGE (Bilateral) - 45 MINS  SURGEON:  Surgeon(s) and Role:    * Alexis Frock, MD - Primary  PHYSICIAN ASSISTANT:   ASSISTANTS: none   ANESTHESIA:   general  EBL:  minimal   BLOOD ADMINISTERED:none  DRAINS: none   LOCAL MEDICATIONS USED:  NONE  SPECIMEN:  Source of Specimen:  bilateral stents  DISPOSITION OF SPECIMEN:  discarsd  COUNTS:  YES  TOURNIQUET:  * No tourniquets in log *  DICTATION: .Other Dictation: Dictation Number [id number]  PLAN OF CARE: Discharge to home after PACU  PATIENT DISPOSITION:  PACU - hemodynamically stable.   Delay start of Pharmacological VTE agent (>24hrs) due to surgical blood loss or risk of bleeding: yes

## 2019-09-06 NOTE — Discharge Instructions (Signed)
°  Post Anesthesia Home Care Instructions ° °Activity: °Get plenty of rest for the remainder of the day. A responsible adult should stay with you for 24 hours following the procedure.  °For the next 24 hours, DO NOT: °-Drive a car °-Operate machinery °-Drink alcoholic beverages °-Take any medication unless instructed by your physician °-Make any legal decisions or sign important papers. ° °Meals: °Start with liquid foods such as gelatin or soup. Progress to regular foods as tolerated. Avoid greasy, spicy, heavy foods. If nausea and/or vomiting occur, drink only clear liquids until the nausea and/or vomiting subsides. Call your physician if vomiting continues. ° °Special Instructions/Symptoms: °Your throat may feel dry or sore from the anesthesia or the breathing tube placed in your throat during surgery. If this causes discomfort, gargle with warm salt water. The discomfort should disappear within 24 hours. ° °If you had a scopolamine patch placed behind your ear for the management of post- operative nausea and/or vomiting: ° °1. The medication in the patch is effective for 72 hours, after which it should be removed.  Wrap patch in a tissue and discard in the trash. Wash hands thoroughly with soap and water. °2. You may remove the patch earlier than 72 hours if you experience unpleasant side effects which may include dry mouth, dizziness or visual disturbances. °3. Avoid touching the patch. Wash your hands with soap and water after contact with the patch. °  °1 - You may have urinary urgency (bladder spasms) and bloody urine on / off with stent in place. This is normal. ° °2 - Call MD or go to ER for fever >102, severe pain / nausea / vomiting not relieved by medications, or acute change in medical status ° °

## 2019-09-06 NOTE — Op Note (Signed)
NAME: Devon Cook, Devon Cook MEDICAL RECORD [id number] ACCOUNT [id number] DATE OF BIRTH:May 28, 1961 FACILITY: [facility] LOCATION: [facility] PHYSICIAN:Charlotte Fidalgo Tresa Moore, MD  OPERATIVE REPORT  DATE OF PROCEDURE:  09/06/2019  PREOPERATIVE DIAGNOSES:  Bilateral malignant obstruction of the ureters, metastatic prostate cancer.  PROCEDURE: 1.  Cystoscopy, bilateral retrograde pyelograms interpretation. 2.  Exchange of bilateral ureteral stents 7 x 26 Polaris, no tether.  ESTIMATED BLOOD LOSS:  Nil.  COMPLICATIONS:  None.  SPECIMENS:  Bilateral ureteral stents for discard inspected and intact.  FINDINGS: 1.  Minimal encrustation of in situ stents. 2.  Mild hydronephrosis bilaterally. 3.  Successful exchange of bilateral ureteral stents proximal end-stage renal pelvis, distal end urinary bladder.  INDICATIONS:  The patient is a very pleasant but unfortunate 59 year old man with 2-year history of metastatic prostate cancer with malignant ureteral obstruction.  He has been managed with stenting with stent exchanges every 4-6 months and has done  quite well with this for a number of years.  He is due for a stent exchange today.  Informed consent was then placed and obtained in medical record.  DESCRIPTION OF PROCEDURE:  Patient being identified as patient, procedure being bilateral ureteral stent exchange was confirmed.  Procedure timeout was performed.  Intravenous antibiotics administered, general LMA anesthesia induced.  The patient was  placed into a low lithotomy position, sterile field was created.  Patient prepped and draped base of the penis, perineum and proximal thighs using iodine.  Cystourethroscopy was performed using 21-French rigid cystoscope vessel lens placed anterior and  posterior urethra was unremarkable.  Inspection of bladder revealed some mild proteinaceous urine.  The distal end of ureteral stent in situ.  They were minimally encrusted.  Distal end of the left stent was grasped,  brought to the level of the urethral  meatus.  A 0.03 ZIPwire was advanced to lower pole.  Stent was exchanged for an open-ended catheter and left retrograde pyelogram was obtained.  Left retrograde pyelogram shows a single left ureter, single system left kidney.  There was mild hydronephrosis at this point, signifying continued good response to stenting.  As such, a new 7 x 26 Polaris-type stent was placed over the ZIPwire using  cystoscopic and fluoroscopic guidance.  Good proximal and distal plane were noted.  Similarly, the distal end of the right stent was grasped, brought to the level of the urethral meatus and a ZIPwire was advanced to lower pole exchanged for open-ended  catheter and right retrograde pyelogram was obtained.  Right retrograde pyelogram demonstrated a single right ureter with single system right kidney.  Mild hydronephrosis was noted consistent with good response to stenting.  A ZIPwire once again advanced and a new 7 x 26 contour-type stent was placed over  the wire on the right side using cystoscopic and fluoroscopic guidance.  Good proximal and distal planes were noted.  Efflux of urine was seen around into the distal end of stents.  Bladder was empty per cystoscope.  Procedure terminated.  The patient  tolerated the procedure well.  No immediate complications.  The patient taken to postanesthesia care in stable condition with plan for discharge home.  CN/NUANCE  D:09/06/2019 T:09/06/2019 JOB:[id number]

## 2019-09-06 NOTE — Telephone Encounter (Signed)
Pt is requesting another inhaler due to him still wheezing. Please advise . [place]

## 2019-09-06 NOTE — H&P (Signed)
Devon Cook is an 59 y.o. male.    Chief Complaint: Pre-OP BILATERAL Ureteral Stent Exchange  HPI:   1 - Malignant Ureteral Obstruction - Cr 17 by PCP labs on eval malaize 11/2017. Non-con CT with hydro to level of thickened bladder. Bilateral neph tubes placed 11/2017 and converted to bilateral 7x26 stents 12/2017 in OR. Now established with [place] [facility] with GFR 15 range, requires aranesp and PRN transfusion.    2 End Stage Renal Disease - Cr 4-8's after bilateral renal decompression c/w permanent renal damage. Hemodialysis initiated 2020 with Dr. Lorrene Reid at [facility]. Has Left AVF that is matureing. May consider PD.   PMH sig for pre-DM, obesity, L spine arthritis. NO isschemic CV disease / blood thinners.   Today "Devon Cook" is seen to proceed with BILATERAL ureteal stent exchange. C19 negative. NO interval fevers. DOin gwell on IHD now but still makes decent volume urine.    Past Medical History:  Diagnosis Date  . Anemia, chronic renal failure    followed by dr Hollie Salk--- dx 05/ 2019---  treated with Iron infusions and procrit  . Bilateral hydronephrosis    malignant-- chronic  . Bronchitis    dx 07-18-2019 by pt's pcp  (08-30-2019 per pt no coughing or congestion today but is had wheezing/sob  this morning which was relieved with albuteral inhaler)  . Chronic gastritis    07-17-2019 and duodenitis  . ED (erectile dysfunction)   . Edema of both lower extremities    08-30-2019  per pt edema has much improved since started hemodialysis  . 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 GERD (gastroesophageal reflux disease)   . Gout    08-30-2019  per pt unsure when last episode   . Herpes genitalis in men   . History of adenomatous polyp of colon   . History of hepatitis C 2017   dx 11/  207-- treated with Harvoni (in epic 04/ 2019 lab result non-detectable)  . History of lower GI bleeding 11/2017  . Hypertension   . OSA (obstructive sleep apnea)    severe osa w/ nocturnal hyoxemia per study 06-19-2019,  pt given cpap ( however on 08-30-2019 per pt having issues with mask fitting and on a new one so he not using the cpap)  . Prostate 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 [facility]) urologist-- dr Tresa Moore   dx 06/ 2019-- advanced w/ mets to bone and pelvic lymphadeopathy  . S/P arteriovenous (AV) fistula creation    03-15-2019  by dr Carlis Abbott, left branhiocephallic  . Secondary hyperparathyroidism of renal origin ([place])   . Wears glasses     Past Surgical History:  Procedure Laterality Date  . AV FISTULA PLACEMENT Left 03/15/2019   Procedure: ARTERIOVENOUS (AV) FISTULA CREATION LEFT ARM;  Surgeon: Marty Heck, MD;  Location: [facility];  Service: Vascular;  Laterality: Left;  . COLONOSCOPY    . CYSTOSCOPY W/ URETERAL STENT PLACEMENT Bilateral 12/08/2017   Procedure: CYSTOSCOPY WITH RETROGRADE PYELOGRAM/URETERAL STENT PLACEMENT;  Surgeon: Alexis Frock, MD;  Location: [facility];  Service: Urology;  Laterality: Bilateral;  . CYSTOSCOPY W/ URETERAL STENT PLACEMENT Bilateral 04/20/2018   Procedure: CYSTOSCOPY WITH STENT REPLACEMENT;  Surgeon: Alexis Frock, MD;  Location: [facility];  Service: Urology;  Laterality: Bilateral;  . CYSTOSCOPY W/ URETERAL STENT PLACEMENT Bilateral 08/10/2018   Procedure:  CYSTOSCOPY WITH STENT REPLACEMENT, BILATERAL RETROGRADES;  Surgeon: Alexis Frock, MD;  Location: [facility];  Service: Urology;  Laterality: Bilateral;  45 MINS  . CYSTOSCOPY W/ URETERAL STENT PLACEMENT Bilateral 01/18/2019   Procedure: CYSTOSCOPY WITH RETROGRADE PYELOGRAM/URETERAL STENT PLACEMENT;  Surgeon: Alexis Frock, MD;  Location: [facility];  Service: Urology;  Laterality: Bilateral;  . CYSTOSCOPY W/ URETERAL STENT PLACEMENT Bilateral  05/24/2019   Procedure: CYSTOSCOPY WITH RETROGRADE PYELOGRAM/BILATERAL URETERAL STENT EXCHANGE;  Surgeon: Alexis Frock, MD;  Location: [facility];  Service: Urology;  Laterality: Bilateral;  . INSERTION OF DIALYSIS CATHETER Right 05/06/2019   Procedure: INSERTION OF TUNNELED  DIALYSIS CATHETER;  Surgeon: Rosetta Posner, MD;  Location: [facility];  Service: Vascular;  Laterality: Right;  . IR NEPHROSTOMY PLACEMENT LEFT  11/15/2017  . IR NEPHROSTOMY PLACEMENT RIGHT  11/15/2017  . PROSTATE BIOPSY N/A 12/08/2017   Procedure: BIOPSY TRANSRECTAL ULTRASONIC PROSTATE (TUBP), PERINEAL BIOPSY;  Surgeon: Alexis Frock, MD;  Location: [facility];  Service: Urology;  Laterality: N/A;  . TRANSURETHRAL RESECTION OF BLADDER TUMOR N/A 12/08/2017   Procedure: TRANSURETHRAL RESECTION OF BLADDER TUMOR (TURBT);  Surgeon: Alexis Frock, MD;  Location: [facility];  Service: Urology;  Laterality: N/A;  . TRICEPS TENDON REPAIR Bilateral 08/2005  . UPPER GASTROINTESTINAL ENDOSCOPY      Family History  Problem Relation Age of Onset  . Hypertension Mother   . Heart disease Mother   . Diabetes Sister   . Diabetes Brother   . Prostate cancer Maternal Grandfather   . Diabetes Sister   . Prostate cancer Maternal Uncle   . Colon polyps Neg Hx   . Esophageal cancer Neg Hx   . Stomach cancer Neg Hx   . Rectal cancer Neg Hx    Social History:  reports that he quit smoking about 5 weeks ago. His smoking use included cigarettes, pipe, and cigars. He has a 3.00 pack-year smoking history. He has never used smokeless tobacco. He reports current alcohol use. He reports that he does not use drugs.  Allergies: No Known Allergies  Medications Prior to Admission  Medication Sig Dispense Refill  . albuterol (VENTOLIN HFA) 108 (90 Base) MCG/ACT inhaler Inhale 2 puffs into the lungs every 6 (six) hours as needed for wheezing or shortness of breath. 18 g 0  . allopurinol (ZYLOPRIM) 100 MG tablet TAKE 1 TABLET BY MOUTH ONCE DAILY  (Patient taking differently: Take 100 mg by mouth 2 (two) times daily. ) 30 tablet 0  . ALPRAZolam (XANAX) 0.25 MG tablet Take 1 tablet (0.25 mg total) by mouth daily as needed for anxiety. 30 tablet 1  . amLODipine (NORVASC) 10 MG tablet Take 10 mg by mouth at bedtime.     Marland Kitchen aspirin EC 81 MG tablet Take 81 mg by mouth daily.    . benzonatate (TESSALON) 100 MG capsule Take 1 capsule (100 mg total) by mouth 2 (two) times daily as needed for cough. 20 capsule 0  . chlorpheniramine-HYDROcodone (TUSSIONEX PENNKINETIC ER) 10-8 MG/5ML SUER Take 5 mLs by mouth every 12 (twelve) hours as needed for cough. 140 mL 0  . ciprofloxacin (CIPRO) 500 MG tablet Take 1 tablet (500 mg total) by mouth 2 (two) times daily. 20 tablet 0  . ferric citrate (AURYXIA) 1 GM 210 MG(Fe) tablet Take 420 mg by mouth 3 (three) times daily with meals.     . methocarbamol (ROBAXIN) 750 MG tablet Take 750 mg by mouth at bedtime as needed for muscle spasms.    Marland Kitchen  Multiple Vitamins-Minerals (MULTIVITAMIN WITH MINERALS) tablet Take 1 tablet by mouth daily.      . pantoprazole (PROTONIX) 40 MG tablet Take 1 tablet (40 mg total) by mouth 2 (two) times daily. 112 tablet 0  . lidocaine-prilocaine (EMLA) cream Apply 1 application topically as needed. 30 g 5  . oxyCODONE-acetaminophen (PERCOCET) 5-325 MG tablet Take 1 tablet by mouth every 8 (eight) hours as needed for moderate pain. From metastatic cancer 60 tablet 0  . Oxymetazoline HCl (VICKS SINEX NA) Place 1 spray into the nose daily as needed (congestion).      Results for orders placed or performed during the hospital encounter of 09/06/19 (from the past 48 hour(s))  I-STAT, chem 8     Status: Abnormal   Collection Time: 09/06/19  6:28 AM  Result Value Ref Range   Sodium 138 135 - 145 mmol/L   Potassium 4.0 3.5 - 5.1 mmol/L   Chloride 94 (L) 98 - 111 mmol/L   BUN 33 (H) 6 - 20 mg/dL   Creatinine, Ser 7.00 (H) 0.61 - 1.24 mg/dL   Glucose, Bld 112 (H) 70 - 99 mg/dL    Comment:  Glucose reference range applies only to samples taken after fasting for at least 8 hours.   Calcium, Ion 1.22 1.15 - 1.40 mmol/L   TCO2 34 (H) 22 - 32 mmol/L   Hemoglobin 10.2 (L) 13.0 - 17.0 g/dL   HCT 30.0 (L) 39.0 - 52.0 %   No results found.  Review of Systems  Constitutional: Positive for fatigue. Negative for fever.  Genitourinary: Positive for hematuria.  All other systems reviewed and are negative.   Blood pressure (!) 148/94, pulse 96, temperature 98.3 F (36.8 C), temperature source Oral, resp. rate 18, height 6\' 1"  (1.854 m), weight (!) 137.5 kg, SpO2 100 %. Physical Exam  Constitutional: He appears well-developed.  At abseline.   HENT:  Head: Normocephalic.  Eyes: Pupils are equal, round, and reactive to light.  Cardiovascular: Normal rate.  Respiratory: Effort normal.  GI:  Stable obesity.   Genitourinary:    Genitourinary Comments: Arm AVF with thrill.    Musculoskeletal:     Cervical back: Normal range of motion.  Neurological: He is alert.  Skin: Skin is warm.  Psychiatric: He has a normal mood and affect.     Assessment/Plan  Proceed as planned with cysto, BILATERAL retrogrades / stent exchange. Risks, benefits, alternatives, expected peri-op course, discussed previously and reiterated today.   Alexis Frock, MD 09/06/2019, 6:34 AM

## 2019-09-06 NOTE — Anesthesia Postprocedure Evaluation (Signed)
Anesthesia Post Note  Patient: ARMAS MCBEE  Procedure(s) Performed: CYSTOSCOPY WITH RETROGRADE PYELOGRAM/URETERAL STENT EXCHANGE (Bilateral Pelvis)     Patient location during evaluation: PACU Anesthesia Type: General Level of consciousness: awake and alert Pain management: pain level controlled Vital Signs Assessment: post-procedure vital signs reviewed and stable Respiratory status: spontaneous breathing, nonlabored ventilation and respiratory function stable Cardiovascular status: blood pressure returned to baseline and stable Postop Assessment: no apparent nausea or vomiting Anesthetic complications: no    Last Vitals:  Vitals:   09/06/19 0827 09/06/19 0835  BP:    Pulse: 73 71  Resp: 17 17  Temp: 36.4 C   SpO2: 100% 100%    Last Pain:  Vitals:   09/06/19 0835  TempSrc:   PainSc: 0-No pain                 Audry Pili

## 2019-09-11 ENCOUNTER — Encounter: Payer: Self-pay | Admitting: Family Medicine

## 2019-09-23 ENCOUNTER — Telehealth: Payer: Self-pay | Admitting: Pulmonary Disease

## 2019-09-23 NOTE — Telephone Encounter (Signed)
Called and spoke with pt letting him know the info stated by RA and he verbalized understanding. Nothing further needed. 

## 2019-09-23 NOTE — Telephone Encounter (Signed)
Melatonin 5 to 10 mg, ask him to take this 1 hour before sleep time

## 2019-09-23 NOTE — Telephone Encounter (Signed)
Called and spoke with pt who states he has been developing anxiety while trying to use the cpap. Pt states he cannot relax while trying to use it and after about 70min, he will have to take it off due to feeling suffocated. Pt states he knows that he needs to wear it and and due to the problems he is having from it he wants to know if there is anything he might be able to take to help him relax so he can be able to hopefully wear the cpap longer at night.  Dr. Elsworth Soho, please advise on this for pt.

## 2019-09-24 ENCOUNTER — Encounter: Payer: Self-pay | Admitting: Family Medicine

## 2019-09-25 ENCOUNTER — Telehealth: Payer: Self-pay

## 2019-09-25 NOTE — Telephone Encounter (Signed)
I am not comfortable continuing to call this in.  He is going to need an appointment to let me listen to his heart and lungs

## 2019-09-25 NOTE — Telephone Encounter (Signed)
Pt. Called stating that you called him in some Cipro a while back and and that did clear up his congestion at the time, but now it is back with the chest congestion he has been around people at dialysis coughing and sneezing, he wants to know if you could call him in another 10 days of Cipro to see if that would clear it up before it gets worse.

## 2019-09-26 NOTE — Telephone Encounter (Signed)
Pt was advised [facility] 

## 2019-09-26 NOTE — Telephone Encounter (Signed)
He can use Robitussin-DM

## 2019-09-26 NOTE — Telephone Encounter (Signed)
Pt has appt Monday . He would like to know what should he do until than. Please advise [facility]

## 2019-09-29 ENCOUNTER — Other Ambulatory Visit: Payer: Self-pay | Admitting: Family Medicine

## 2019-09-29 DIAGNOSIS — J209 Acute bronchitis, unspecified: Secondary | ICD-10-CM

## 2019-09-30 ENCOUNTER — Ambulatory Visit: Payer: Self-pay | Admitting: Family Medicine

## 2019-09-30 NOTE — Telephone Encounter (Signed)
[facility] is requesting to fill pt  Albuterol inhaler. Please advise . [place]

## 2019-10-05 ENCOUNTER — Other Ambulatory Visit: Payer: Self-pay

## 2019-10-05 ENCOUNTER — Emergency Department (HOSPITAL_COMMUNITY)
Admission: EM | Admit: 2019-10-05 | Discharge: 2019-10-05 | Disposition: A | Discharge status: Home / Self Care | Payer: 59 | Attending: Emergency Medicine | Admitting: Emergency Medicine

## 2019-10-05 ENCOUNTER — Emergency Department (HOSPITAL_COMMUNITY): Payer: 59

## 2019-10-05 ENCOUNTER — Encounter (HOSPITAL_COMMUNITY): Payer: Self-pay | Admitting: Emergency Medicine

## 2019-10-05 DIAGNOSIS — I480 Paroxysmal atrial fibrillation: Secondary | ICD-10-CM | POA: Insufficient documentation

## 2019-10-05 DIAGNOSIS — I12 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 stage renal disease: Secondary | ICD-10-CM | POA: Diagnosis not present

## 2019-10-05 DIAGNOSIS — Z87891 Personal history of nicotine dependence: Secondary | ICD-10-CM | POA: Insufficient documentation

## 2019-10-05 DIAGNOSIS — Z7982 Long term (current) use of aspirin: Secondary | ICD-10-CM | POA: Diagnosis not present

## 2019-10-05 DIAGNOSIS — Z79899 Other long term (current) drug therapy: Secondary | ICD-10-CM | POA: Diagnosis not present

## 2019-10-05 DIAGNOSIS — Z992 Dependence on renal dialysis: Secondary | ICD-10-CM | POA: Diagnosis not present

## 2019-10-05 DIAGNOSIS — N186 End stage renal disease: Secondary | ICD-10-CM | POA: Insufficient documentation

## 2019-10-05 DIAGNOSIS — R000 Tachycardia, unspecified: Secondary | ICD-10-CM | POA: Diagnosis present

## 2019-10-05 LAB — CBC WITH DIFFERENTIAL/PLATELET
Abs Immature Granulocytes: 0.06 10*3/uL (ref 0.00–0.07)
Basophils Absolute: 0.1 10*3/uL (ref 0.0–0.1)
Basophils Relative: 1 %
Eosinophils Absolute: 0.5 10*3/uL (ref 0.0–0.5)
Eosinophils Relative: 5 %
HCT: 27.5 % — ABNORMAL LOW (ref 39.0–52.0)
Hemoglobin: 9 g/dL — ABNORMAL LOW (ref 13.0–17.0)
Immature Granulocytes: 1 %
Lymphocytes Relative: 13 %
Lymphs Abs: 1.4 10*3/uL (ref 0.7–4.0)
MCH: 27.3 pg (ref 26.0–34.0)
MCHC: 32.7 g/dL (ref 30.0–36.0)
MCV: 83.3 fL (ref 80.0–100.0)
Monocytes Absolute: 1.3 10*3/uL — ABNORMAL HIGH (ref 0.1–1.0)
Monocytes Relative: 13 %
Neutro Abs: 7.1 10*3/uL (ref 1.7–7.7)
Neutrophils Relative %: 67 %
Platelets: 331 10*3/uL (ref 150–400)
RBC: 3.3 MIL/uL — ABNORMAL LOW (ref 4.22–5.81)
RDW: 17.1 % — ABNORMAL HIGH (ref 11.5–15.5)
WBC: 10.3 10*3/uL (ref 4.0–10.5)
nRBC: 0.2 % (ref 0.0–0.2)

## 2019-10-05 LAB — TSH: TSH: 2.732 u[IU]/mL (ref 0.350–4.500)

## 2019-10-05 LAB — PROTIME-INR
INR: 1 (ref 0.8–1.2)
Prothrombin Time: 13.1 seconds (ref 11.4–15.2)

## 2019-10-05 LAB — COMPREHENSIVE METABOLIC PANEL
ALT: 21 U/L (ref 0–44)
AST: 24 U/L (ref 15–41)
Albumin: 3.4 g/dL — ABNORMAL LOW (ref 3.5–5.0)
Alkaline Phosphatase: 142 U/L — ABNORMAL HIGH (ref 38–126)
Anion gap: 16 — ABNORMAL HIGH (ref 5–15)
BUN: 30 mg/dL — ABNORMAL HIGH (ref 6–20)
CO2: 28 mmol/L (ref 22–32)
Calcium: 9 mg/dL (ref 8.9–10.3)
Chloride: 96 mmol/L — ABNORMAL LOW (ref 98–111)
Creatinine, Ser: 5.49 mg/dL — ABNORMAL HIGH (ref 0.61–1.24)
GFR calc Af Amer: 12 mL/min — ABNORMAL LOW (ref >60)
GFR calc non Af Amer: 11 mL/min — ABNORMAL LOW (ref >60)
Glucose, Bld: 103 mg/dL — ABNORMAL HIGH (ref 70–99)
Potassium: 3.2 mmol/L — ABNORMAL LOW (ref 3.5–5.1)
Sodium: 140 mmol/L (ref 135–145)
Total Bilirubin: 0.3 mg/dL (ref 0.3–1.2)
Total Protein: 8.1 g/dL (ref 6.5–8.1)

## 2019-10-05 LAB — BRAIN NATRIURETIC PEPTIDE: B Natriuretic Peptide: 41.3 pg/mL (ref 0.0–100.0)

## 2019-10-05 LAB — MAGNESIUM: Magnesium: 2 mg/dL (ref 1.7–2.4)

## 2019-10-05 LAB — TROPONIN I (HIGH SENSITIVITY)
Troponin I (High Sensitivity): 23 ng/L — ABNORMAL HIGH (ref <18)
Troponin I (High Sensitivity): 24 ng/L — ABNORMAL HIGH (ref <18)

## 2019-10-05 LAB — PHOSPHORUS: Phosphorus: 3.2 mg/dL (ref 2.5–4.6)

## 2019-10-05 NOTE — ED Triage Notes (Signed)
Pt BIB [facility] from dialysis. Complaint of rapid afib with rates in 150s-160s which is not normal for pt. No other symptoms. Pt received 20 mg cardizem via EMS and conveted. VSS.NAD.

## 2019-10-05 NOTE — ED Notes (Signed)
Pt discharge instructions and follow-up care reviewed with the patient. The patient verbalized understanding of both. Pt discharged. 

## 2019-10-05 NOTE — ED Provider Notes (Signed)
[place] EMERGENCY DEPARTMENT Provider Note   CSN: [id number] Arrival date & time: 10/05/19  1208     History No chief complaint on file.   Devon Cook is a 59 y.o. male.  HPI Patient was at dialysis this morning when symptoms started.  He started to feel that his heart was racing and he felt very fatigued and slightly short of breath.  Dialysis nurse noted patient's heart rate had jumped up to 150s to 160s.  EMS was called.  EMS assessed the patient administered 20 mg of Cardizem.  Symptoms had completely resolved prior to arrival to the emergency department.  Patient had return to normal sinus rhythm in the 80s.  He denies any symptoms at this time.  He denies any known episodes of atrial fibrillation.  Patient does take a daily aspirin.  He has not been experiencing unusual chest pain, palpitations or racing heart.  He reports he was getting an extra session of dialysis because he was supposed to get his second Covid shot today.  They had done 3 sessions in the week and then were doing a half session today.  This was so he would not have to go to dialysis tomorrow.  His regular schedule is Tuesday Thursday Saturday.  His next scheduled dialysis is on Monday.      Past Medical History:  Diagnosis Date  . Anemia, chronic renal failure    followed by dr Hollie Salk--- dx 05/ 2019---  treated with Iron infusions and procrit  . Bilateral hydronephrosis    malignant-- chronic  . Bronchitis    dx 07-18-2019 by pt's pcp  (08-30-2019 per pt no coughing or congestion today but is had wheezing/sob  this morning which was relieved with albuteral inhaler)  . Chronic gastritis    07-17-2019 and duodenitis  . ED (erectile dysfunction)   . Edema of both lower extremities    08-30-2019  per pt edema has much improved since started hemodialysis  . 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 GERD (gastroesophageal reflux disease)   . Gout    08-30-2019  per pt unsure when last episode   . Herpes genitalis in men   . History of adenomatous polyp of colon   . History of hepatitis C 2017   dx 11/ 207-- treated with Harvoni (in epic 04/ 2019 lab result non-detectable)  . History of lower GI bleeding 11/2017  . Hypertension   . OSA (obstructive sleep apnea)    severe osa w/ nocturnal hyoxemia per study 06-19-2019,  pt given cpap ( however on 08-30-2019 per pt having issues with mask fitting and on a new one so he not using the cpap)  . Prostate 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 [facility]) urologist-- dr Tresa Moore   dx 06/ 2019-- advanced w/ mets to bone and pelvic lymphadeopathy  . S/P arteriovenous (AV) fistula creation    03-15-2019  by dr Carlis Abbott, left branhiocephallic  . Secondary hyperparathyroidism of renal origin ([place])   . Wears glasses     Patient Active Problem List   Diagnosis Date Noted  . Acute bronchitis 08/28/2019  . OSA (obstructive sleep apnea) 08/28/2019  . ESRD on dialysis ([place]) 06/21/2019  . Hydronephrosis 12/08/2017  . Prostate cancer ([place]) 12/02/2017  . GI bleed 11/14/2017  . Chronic renal failure 11/14/2017  . Anemia  11/14/2017  . History of hepatitis C 11/13/2017  . Hepatitis C antibody test positive 07/15/2013  . ED (erectile dysfunction) 01/09/2012  . Hypertension 01/09/2012  . Morbid obesity with BMI of 40.0-44.9, adult ([place]) 01/09/2012  . Impaired fasting glucose 01/09/2012    Past Surgical History:  Procedure Laterality Date  . AV FISTULA PLACEMENT Left 03/15/2019   Procedure: ARTERIOVENOUS (AV) FISTULA CREATION LEFT ARM;  Surgeon: Marty Heck, MD;  Location: [place];  Service: Vascular;  Laterality: Left;  . COLONOSCOPY    . CYSTOSCOPY W/ URETERAL STENT PLACEMENT Bilateral 12/08/2017   Procedure: CYSTOSCOPY WITH RETROGRADE  PYELOGRAM/URETERAL STENT PLACEMENT;  Surgeon: Alexis Frock, MD;  Location: [facility];  Service: Urology;  Laterality: Bilateral;  . CYSTOSCOPY W/ URETERAL STENT PLACEMENT Bilateral 04/20/2018   Procedure: CYSTOSCOPY WITH STENT REPLACEMENT;  Surgeon: Alexis Frock, MD;  Location: [facility];  Service: Urology;  Laterality: Bilateral;  . CYSTOSCOPY W/ URETERAL STENT PLACEMENT Bilateral 08/10/2018   Procedure: CYSTOSCOPY WITH STENT REPLACEMENT, BILATERAL RETROGRADES;  Surgeon: Alexis Frock, MD;  Location: [facility];  Service: Urology;  Laterality: Bilateral;  45 MINS  . CYSTOSCOPY W/ URETERAL STENT PLACEMENT Bilateral 01/18/2019   Procedure: CYSTOSCOPY WITH RETROGRADE PYELOGRAM/URETERAL STENT PLACEMENT;  Surgeon: Alexis Frock, MD;  Location: [facility];  Service: Urology;  Laterality: Bilateral;  . CYSTOSCOPY W/ URETERAL STENT PLACEMENT Bilateral 05/24/2019   Procedure: CYSTOSCOPY WITH RETROGRADE PYELOGRAM/BILATERAL URETERAL STENT EXCHANGE;  Surgeon: Alexis Frock, MD;  Location: [facility];  Service: Urology;  Laterality: Bilateral;  . CYSTOSCOPY W/ URETERAL STENT PLACEMENT Bilateral 09/06/2019   Procedure: CYSTOSCOPY WITH RETROGRADE PYELOGRAM/URETERAL STENT EXCHANGE;  Surgeon: Alexis Frock, MD;  Location: [facility];  Service: Urology;  Laterality: Bilateral;  45 MINS  . INSERTION OF DIALYSIS CATHETER Right 05/06/2019   Procedure: INSERTION OF TUNNELED  DIALYSIS CATHETER;  Surgeon: Rosetta Posner, MD;  Location: [facility];  Service: Vascular;  Laterality: Right;  . IR NEPHROSTOMY PLACEMENT LEFT  11/15/2017  . IR NEPHROSTOMY PLACEMENT RIGHT  11/15/2017  . PROSTATE BIOPSY N/A 12/08/2017   Procedure: BIOPSY TRANSRECTAL ULTRASONIC PROSTATE (TUBP), PERINEAL BIOPSY;  Surgeon: Alexis Frock, MD;  Location: [facility];  Service: Urology;  Laterality: N/A;  . TRANSURETHRAL RESECTION OF BLADDER TUMOR N/A 12/08/2017   Procedure:  TRANSURETHRAL RESECTION OF BLADDER TUMOR (TURBT);  Surgeon: Alexis Frock, MD;  Location: [facility];  Service: Urology;  Laterality: N/A;  . TRICEPS TENDON REPAIR Bilateral 08/2005  . UPPER GASTROINTESTINAL ENDOSCOPY         Family History  Problem Relation Age of Onset  . Hypertension Mother   . Heart disease Mother   . Diabetes Sister   . Diabetes Brother   . Prostate cancer Maternal Grandfather   . Diabetes Sister   . Prostate cancer Maternal Uncle   . Colon polyps Neg Hx   . Esophageal cancer Neg Hx   . Stomach cancer Neg Hx   . Rectal cancer Neg Hx     Social History   Tobacco Use  . Smoking status: Former Smoker    Packs/day: 0.50    Years: 6.00    Pack years: 3.00    Types: Cigarettes, Pipe, Cigars    Quit date: 07/28/2019    Years since quitting: 0.1  . Smokeless tobacco: Never Used  . Tobacco comment: occ cigar  Substance Use Topics  . Alcohol use: Yes    Comment: 2 drinks per weekend  . Drug use: No  Home Medications Prior to Admission medications   Medication Sig Start Date End Date Taking? Authorizing Provider  allopurinol (ZYLOPRIM) 100 MG tablet TAKE 1 TABLET BY MOUTH ONCE DAILY Patient taking differently: Take 100 mg by mouth 2 (two) times daily.  01/22/18   [name], MD  ALPRAZolam Duanne Moron) 0.25 MG tablet Take 1 tablet (0.25 mg total) by mouth daily as needed for anxiety. 08/26/19   [name], MD  amLODipine (NORVASC) 10 MG tablet Take 10 mg by mouth at bedtime.     [provider]  aspirin EC 81 MG tablet Take 81 mg by mouth daily.    [provider]  benzonatate (TESSALON) 100 MG capsule Take 1 capsule (100 mg total) by mouth 2 (two) times daily as needed for cough. 07/29/19   [name], MD  chlorpheniramine-HYDROcodone (TUSSIONEX PENNKINETIC ER) 10-8 MG/5ML SUER Take 5 mLs by mouth every 12 (twelve) hours as needed for cough. 08/01/19   [name], MD  ciprofloxacin (CIPRO) 500 MG tablet Take 1 tablet (500 mg  total) by mouth 2 (two) times daily. 08/26/19   [name], MD  ferric citrate (AURYXIA) 1 GM 210 MG(Fe) tablet Take 420 mg by mouth 3 (three) times daily with meals.     [provider]  lidocaine-prilocaine (EMLA) cream Apply 1 application topically as needed. 09/02/19   [name], MD  methocarbamol (ROBAXIN) 750 MG tablet Take 750 mg by mouth at bedtime as needed for muscle spasms.    [provider]  Multiple Vitamins-Minerals (MULTIVITAMIN WITH MINERALS) tablet Take 1 tablet by mouth daily.      [provider]  oxyCODONE-acetaminophen (PERCOCET) 5-325 MG tablet Take 1 tablet by mouth every 8 (eight) hours as needed for moderate pain. From metastatic cancer and post-operatively. 09/06/19 09/05/20  Alexis Frock, MD  Oxymetazoline HCl (VICKS SINEX NA) Place 1 spray into the nose daily as needed (congestion).    [provider]  pantoprazole (PROTONIX) 40 MG tablet Take 1 tablet (40 mg total) by mouth 2 (two) times daily. 07/17/19   Thornton Park, MD  VENTOLIN HFA 108 (90 Base) MCG/ACT inhaler TAKE 2 PUFFS BY MOUTH EVERY 6 HOURS AS NEEDED FOR WHEEZE OR SHORTNESS OF BREATH 09/30/19   [name], MD    Allergies    Patient has no known allergies.  Review of Systems   Review of Systems 10 Systems reviewed and are negative for acute change except as noted in the HPI.  Physical Exam Updated Vital Signs BP 135/87   Pulse 82   Resp (!) 23   Ht 6\' 2"  (1.88 m)   Wt 135.2 kg   SpO2 99%   BMI 38.26 kg/m   Physical Exam Constitutional:      Comments: Alert nontoxic and clinically well in appearance.  HENT:     Head: Normocephalic and atraumatic.  Eyes:     Extraocular Movements: Extraocular movements intact.  Cardiovascular:     Rate and Rhythm: Normal rate and regular rhythm.  Pulmonary:     Effort: Pulmonary effort is normal.     Breath sounds: Normal breath sounds.  Abdominal:     General: There is no distension.     Palpations:  Abdomen is soft.     Tenderness: There is no abdominal tenderness. There is no guarding.  Musculoskeletal:        General: No swelling or tenderness. Normal range of motion.     Right lower leg:  No edema.     Left lower leg: No edema.  Skin:    General: Skin is warm and dry.  Neurological:     General: No focal deficit present.     Mental Status: He is oriented to person, place, and time.     Cranial Nerves: No cranial nerve deficit.     Coordination: Coordination normal.  Psychiatric:        Mood and Affect: Mood normal.     ED Results / Procedures / Treatments   Labs (all labs ordered are listed, but only abnormal results are displayed) Labs Reviewed  COMPREHENSIVE METABOLIC PANEL - Abnormal; Notable for the following components:      Result Value   Potassium 3.2 (*)    Chloride 96 (*)    Glucose, Bld 103 (*)    BUN 30 (*)    Creatinine, Ser 5.49 (*)    Albumin 3.4 (*)    Alkaline Phosphatase 142 (*)    GFR calc non Af Amer 11 (*)    GFR calc Af Amer 12 (*)    Anion gap 16 (*)    All other components within normal limits  CBC WITH DIFFERENTIAL/PLATELET - Abnormal; Notable for the following components:   RBC 3.30 (*)    Hemoglobin 9.0 (*)    HCT 27.5 (*)    RDW 17.1 (*)    Monocytes Absolute 1.3 (*)    All other components within normal limits  TROPONIN I (HIGH SENSITIVITY) - Abnormal; Notable for the following components:   Troponin I (High Sensitivity) 23 (*)    All other components within normal limits  TROPONIN I (HIGH SENSITIVITY) - Abnormal; Notable for the following components:   Troponin I (High Sensitivity) 24 (*)    All other components within normal limits  BRAIN NATRIURETIC PEPTIDE  PROTIME-INR  MAGNESIUM  PHOSPHORUS  TSH    EKG EKG Interpretation  Date/Time:  Saturday October 05 2019 12:22:34 EDT Ventricular Rate:  88 PR Interval:    QRS Duration: 95 QT Interval:  416 QTC Calculation: 504 R Axis:   13 Text Interpretation: Sinus rhythm no  sig change from previous Confirmed by Charlesetta Shanks [phone]) on 10/05/2019 4:10:08 PM   Radiology DG Chest Port 1 View  Result Date: 10/05/2019 CLINICAL DATA:  Pt was at dialysis when he had a high HR. Denies chest pain. No heart or lung conditions. Smokes occasionally. EXAM: PORTABLE CHEST 1 VIEW COMPARISON:  07/26/2019 FINDINGS: Cardiac silhouette is normal in size. No mediastinal or hilar masses. Right anterior chest wall, internal jugular, tunneled dual lumen catheter is stable. Lung volumes are relatively low.  Lungs are clear. No convincing pleural effusion.  No pneumothorax. Skeletal structures are grossly intact. IMPRESSION: No active disease. Electronically Signed   By: Lajean Manes M.D.   On: 10/05/2019 12:44    Procedures Procedures (including critical care time)  Medications Ordered in ED Medications - No data to display  ED Course  I have reviewed the triage vital signs and the nursing notes.  Pertinent labs & imaging results that were available during my care of the patient were reviewed by me and considered in my medical decision making (see chart for details).    MDM Rules/Calculators/A&P                     CHA2DS2-VASc Score = 1 The patient's score is based upon: CHF History: No HTN History: Yes Age : < 65 Diabetes History: No Stroke History:  No Vascular Disease History: No Gender: Male       Signed,  Charlesetta Shanks, MD    10/05/2019 5:06 PM    EMS tracings show a rapid rate narrow complex.  Flutter versus SVT versus atrial fibrillation.  Symptoms were resolved by administration of Cardizem by EMS.  Patient has low CHA2DS2-VASc score.  Troponins are stable.  EKG is unchanged from previous.  Patient is asymptomatic.  At this time stable for discharge.  He will continue his daily aspirin.  Patient is going to call his cardiologist on Monday.  Return precautions reviewed.  Final Clinical Impression(s) / ED Diagnoses Final diagnoses:  Paroxysmal atrial  fibrillation ([place])  ESRD on dialysis [facility])    Rx / DC Orders ED Discharge Orders    None       Charlesetta Shanks, MD 10/05/19 1710

## 2019-10-05 NOTE — ED Notes (Signed)
Help get patient undress on the monitor did ekg shown to er doctor patient is resting with call bell in reach

## 2019-10-05 NOTE — Discharge Instructions (Signed)
1.  You had an episode of atrial fibrillation that resolved quickly.  This is called paroxysmal atrial fibrillation.  It is very important you follow-up with your cardiologist regarding this.  Call your cardiologist on Monday. 2.  Return to the emergency department if you develop chest pain, feeling like your heart is racing or skipping beats, lightheadedness or other concerning symptoms. 3.  Continue your daily aspirin.

## 2019-10-07 ENCOUNTER — Telehealth (HOSPITAL_COMMUNITY): Payer: Self-pay

## 2019-10-07 NOTE — Telephone Encounter (Signed)
Left message for patient to call back the Afib clinic regarding follow up appointment from the ER.

## 2019-10-09 ENCOUNTER — Ambulatory Visit: Payer: Self-pay | Admitting: Family Medicine

## 2019-10-14 ENCOUNTER — Ambulatory Visit: Payer: Self-pay | Admitting: Family Medicine

## 2019-10-16 ENCOUNTER — Ambulatory Visit: Payer: 59 | Admitting: Adult Health

## 2019-10-18 ENCOUNTER — Ambulatory Visit: Payer: Self-pay | Admitting: Family Medicine

## 2019-10-21 ENCOUNTER — Ambulatory Visit: Payer: Self-pay | Admitting: Family Medicine

## 2019-10-23 ENCOUNTER — Telehealth: Payer: Self-pay

## 2019-10-23 ENCOUNTER — Other Ambulatory Visit: Payer: Self-pay

## 2019-10-23 ENCOUNTER — Ambulatory Visit: Payer: 59 | Admitting: Family Medicine

## 2019-10-23 VITALS — BP 144/88 | HR 94 | Temp 97.8°F | Wt 309.0 lb

## 2019-10-23 DIAGNOSIS — J301 Allergic rhinitis due to pollen: Secondary | ICD-10-CM

## 2019-10-23 DIAGNOSIS — G4733 Obstructive sleep apnea (adult) (pediatric): Secondary | ICD-10-CM

## 2019-10-23 NOTE — Telephone Encounter (Signed)
Advised line care of pt on going issues with sinus. They were also advied that he has been place on a new medicine to hopefully improve pt compliance with the CPAP machine. [place]

## 2019-10-23 NOTE — Patient Instructions (Signed)
Use over-the-counter medicine called to Rhinocort regularly also use either Claritin or Allegra.  Use the Afrin nasal spray only at night for the next week.  Set up a virtual visit for about 3 weeks so we can readdress this

## 2019-10-23 NOTE — Progress Notes (Signed)
   Subjective:    Patient ID: Devon Cook, male    DOB: [birth year], 59 y.o.   MRN: [id number]  HPI He is here for evaluation of continued difficulty with nasal congestion, sneezing, itchy watery eyes, rhinorrhea.  In the past this has not been a problem but recently this has made it difficult for him to use his CPAP.  He had no cough or congestion, fever or chills.  In the past he has not used any medication.  Presently he is not using any medication.   Review of Systems     Objective:   Physical Exam Alert and in no distress.  Nasal mucosa is slightly reddish-purple.  TMs normal.  Throat clear.  Neck is supple without adenopathy.  Heart and lung exam normal.       Assessment & Plan:  OSA (obstructive sleep apnea)  Seasonal allergic rhinitis due to pollen Recommend he use Rhinocort nasal spray as well as either Claritin or Allegra.  Also recommended that he use Afrin nasal spray but only at night for the next week so he can use his CPAP.  He will set up a virtual visit for recheck in 3 weeks

## 2019-10-25 ENCOUNTER — Other Ambulatory Visit: Payer: Self-pay | Admitting: Family Medicine

## 2019-10-25 DIAGNOSIS — J209 Acute bronchitis, unspecified: Secondary | ICD-10-CM

## 2019-11-01 ENCOUNTER — Encounter: Payer: Self-pay | Admitting: Family Medicine

## 2019-11-04 ENCOUNTER — Ambulatory Visit: Payer: 59 | Admitting: Adult Health

## 2019-11-20 ENCOUNTER — Other Ambulatory Visit: Payer: Self-pay

## 2019-11-20 ENCOUNTER — Telehealth: Payer: 59 | Admitting: Family Medicine

## 2019-11-20 ENCOUNTER — Encounter: Payer: Self-pay | Admitting: Family Medicine

## 2019-11-20 ENCOUNTER — Other Ambulatory Visit: Payer: Self-pay | Admitting: Urology

## 2019-11-20 VITALS — BP 130/86 | Temp 97.6°F | Wt 300.0 lb

## 2019-11-20 DIAGNOSIS — C61 Malignant neoplasm of prostate: Secondary | ICD-10-CM

## 2019-11-21 NOTE — Progress Notes (Signed)
Not seen

## 2019-11-25 ENCOUNTER — Other Ambulatory Visit: Payer: Self-pay | Admitting: Family Medicine

## 2019-11-25 DIAGNOSIS — J209 Acute bronchitis, unspecified: Secondary | ICD-10-CM

## 2019-11-25 NOTE — Telephone Encounter (Signed)
[facility] is requesting to fill pt [facility]olin [facility] Please advise [facility]

## 2019-12-27 ENCOUNTER — Other Ambulatory Visit: Payer: Self-pay | Admitting: Family Medicine

## 2019-12-27 DIAGNOSIS — J209 Acute bronchitis, unspecified: Secondary | ICD-10-CM

## 2019-12-27 NOTE — Telephone Encounter (Signed)
[facility] is requesting to fill pt [facility]olin. Please advise [facility]

## 2020-01-24 LAB — PSA: PSA: 0.038

## 2020-01-29 ENCOUNTER — Encounter (HOSPITAL_COMMUNITY)
Admission: RE | Admit: 2020-01-29 | Discharge: 2020-01-29 | Disposition: A | Discharge status: Home / Self Care | Payer: 59 | Source: Ambulatory Visit | Attending: Urology | Admitting: Urology

## 2020-01-29 ENCOUNTER — Other Ambulatory Visit: Payer: Self-pay

## 2020-01-29 DIAGNOSIS — C61 Malignant neoplasm of prostate: Secondary | ICD-10-CM | POA: Diagnosis present

## 2020-01-29 MED ORDER — TECHNETIUM TC 99M MEDRONATE IV KIT
20.0000 | PACK | Freq: Once | INTRAVENOUS | Status: AC | PRN
  Administered 2020-01-29: 22 via INTRAVENOUS

## 2020-01-31 ENCOUNTER — Telehealth: Payer: Self-pay | Admitting: Family Medicine

## 2020-01-31 NOTE — Telephone Encounter (Signed)
Pt called and states that neuro did xray's on hime that should be in his chart. He would like you to review and let him know what you think. Pt can be reached at [phone].

## 2020-02-05 ENCOUNTER — Other Ambulatory Visit: Payer: Self-pay | Admitting: *Deleted

## 2020-02-05 DIAGNOSIS — N186 End stage renal disease: Secondary | ICD-10-CM

## 2020-02-19 ENCOUNTER — Encounter (HOSPITAL_COMMUNITY): Payer: 59

## 2020-02-19 ENCOUNTER — Ambulatory Visit: Payer: 59

## 2020-02-20 ENCOUNTER — Other Ambulatory Visit: Payer: Self-pay | Admitting: Urology

## 2020-03-05 ENCOUNTER — Ambulatory Visit (HOSPITAL_COMMUNITY)
Admission: RE | Admit: 2020-03-05 | Discharge: 2020-03-05 | Disposition: A | Discharge status: Home / Self Care | Payer: 59 | Source: Ambulatory Visit | Attending: Physician Assistant | Admitting: Physician Assistant

## 2020-03-05 ENCOUNTER — Ambulatory Visit (INDEPENDENT_AMBULATORY_CARE_PROVIDER_SITE_OTHER): Payer: 59 | Admitting: Physician Assistant

## 2020-03-05 ENCOUNTER — Encounter: Payer: Self-pay | Admitting: *Deleted

## 2020-03-05 ENCOUNTER — Other Ambulatory Visit: Payer: Self-pay

## 2020-03-05 ENCOUNTER — Other Ambulatory Visit: Payer: Self-pay | Admitting: *Deleted

## 2020-03-05 VITALS — BP 136/89 | HR 101 | Temp 97.8°F | Resp 20 | Ht 74.0 in | Wt 293.4 lb

## 2020-03-05 DIAGNOSIS — N186 End stage renal disease: Secondary | ICD-10-CM

## 2020-03-05 DIAGNOSIS — Z992 Dependence on renal dialysis: Secondary | ICD-10-CM | POA: Diagnosis not present

## 2020-03-05 NOTE — Progress Notes (Signed)
HISTORY AND PHYSICAL     CC:  dialysis access Requesting Provider:  [name], MD  HPI: This is a 59 y.o. male here for evaluation of his hemodialysis access.  He underwent a left BC AVF on 03/15/2019 by Dr. Carlis Abbott.  He had a TDC placed on 05/06/2019 by Dr. Donnetta Hutching.   He states that he was told the fistula didn't feel right and another told him it was too deep.  He has not let them stick the fistula yet.  He tells me he had a fistulogram earlier this year around March by Dr. Augustin Coupe.  He states they did not use a stent and ballooned a narrowing.  The pt comes in today to have his fistula evaluated.  He does not have any pain in his left hand.   He is having surgery in a couple of weeks to replace his ureteral stents for bilateral malignant obstruction.  He tells me he is in remission from prostate cancer.      Pt is on dialysis.   Days of dialysis if applicable:  T/T/S    HD center if applicable:  [place] location.  The pt is not on a statin for cholesterol management.  The pt is on a daily aspirin.  Other AC:  none The pt is on CCB for hypertension.  The pt is not diabetic.   Tobacco hx:  Current  Pt is a Archivist.  Past Medical History:  Diagnosis Date  . Anemia, chronic renal failure    followed by dr Hollie Salk--- dx 05/ 2019---  treated with Iron infusions and procrit  . Bilateral hydronephrosis    malignant-- chronic  . Bronchitis    dx 07-18-2019 by pt's pcp  (08-30-2019 per pt no coughing or congestion today but is had wheezing/sob  this morning which was relieved with albuteral inhaler)  . Chronic gastritis    07-17-2019 and duodenitis  . ED (erectile dysfunction)   . Edema of both lower extremities    08-30-2019  per pt edema has much improved since started hemodialysis  . 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 GERD (gastroesophageal reflux disease)   . Gout    08-30-2019  per pt unsure when last episode   . Herpes genitalis in men   . History of adenomatous polyp of colon   . History of hepatitis C 2017   dx 11/ 207-- treated with Harvoni (in epic 04/ 2019 lab result non-detectable)  . History of lower GI bleeding 11/2017  . Hypertension   . OSA (obstructive sleep apnea)    severe osa w/ nocturnal hyoxemia per study 06-19-2019,  pt given cpap ( however on 08-30-2019 per pt having issues with mask fitting and on a new one so he not using the cpap)  . Prostate 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 [facility]) urologist-- dr Tresa Moore   dx 06/ 2019-- advanced w/ mets to bone and pelvic lymphadeopathy  . S/P arteriovenous (AV) fistula creation    03-15-2019  by dr Carlis Abbott, left branhiocephallic  . Secondary hyperparathyroidism of renal origin ([place])   . Wears glasses     Past Surgical History:  Procedure Laterality Date  . AV FISTULA PLACEMENT Left 03/15/2019   Procedure: ARTERIOVENOUS (AV) FISTULA CREATION LEFT ARM;  Surgeon: Marty Heck, MD;  Location: [place];  Service: Vascular;  Laterality: Left;  . COLONOSCOPY    . CYSTOSCOPY W/ URETERAL STENT PLACEMENT Bilateral 12/08/2017   Procedure: CYSTOSCOPY WITH RETROGRADE PYELOGRAM/URETERAL STENT PLACEMENT;  Surgeon: Alexis Frock, MD;  Location: [facility];  Service: Urology;  Laterality: Bilateral;  . CYSTOSCOPY W/ URETERAL STENT PLACEMENT Bilateral 04/20/2018   Procedure: CYSTOSCOPY WITH STENT REPLACEMENT;  Surgeon: Alexis Frock, MD;  Location: [facility];  Service: Urology;  Laterality: Bilateral;  . CYSTOSCOPY W/ URETERAL STENT PLACEMENT Bilateral 08/10/2018   Procedure: CYSTOSCOPY WITH STENT REPLACEMENT, BILATERAL RETROGRADES;  Surgeon: Alexis Frock, MD;  Location: [facility];  Service: Urology;  Laterality: Bilateral;  45 MINS  . CYSTOSCOPY W/ URETERAL STENT  PLACEMENT Bilateral 01/18/2019   Procedure: CYSTOSCOPY WITH RETROGRADE PYELOGRAM/URETERAL STENT PLACEMENT;  Surgeon: Alexis Frock, MD;  Location: [facility];  Service: Urology;  Laterality: Bilateral;  . CYSTOSCOPY W/ URETERAL STENT PLACEMENT Bilateral 05/24/2019   Procedure: CYSTOSCOPY WITH RETROGRADE PYELOGRAM/BILATERAL URETERAL STENT EXCHANGE;  Surgeon: Alexis Frock, MD;  Location: [facility];  Service: Urology;  Laterality: Bilateral;  . CYSTOSCOPY W/ URETERAL STENT PLACEMENT Bilateral 09/06/2019   Procedure: CYSTOSCOPY WITH RETROGRADE PYELOGRAM/URETERAL STENT EXCHANGE;  Surgeon: Alexis Frock, MD;  Location: [facility];  Service: Urology;  Laterality: Bilateral;  45 MINS  . INSERTION OF DIALYSIS CATHETER Right 05/06/2019   Procedure: INSERTION OF TUNNELED  DIALYSIS CATHETER;  Surgeon: Rosetta Posner, MD;  Location: [place];  Service: Vascular;  Laterality: Right;  . IR NEPHROSTOMY PLACEMENT LEFT  11/15/2017  . IR NEPHROSTOMY PLACEMENT RIGHT  11/15/2017  . PROSTATE BIOPSY N/A 12/08/2017   Procedure: BIOPSY TRANSRECTAL ULTRASONIC PROSTATE (TUBP), PERINEAL BIOPSY;  Surgeon: Alexis Frock, MD;  Location: [facility];  Service: Urology;  Laterality: N/A;  . TRANSURETHRAL RESECTION OF BLADDER TUMOR N/A 12/08/2017   Procedure: TRANSURETHRAL RESECTION OF BLADDER TUMOR (TURBT);  Surgeon: Alexis Frock, MD;  Location: [facility];  Service: Urology;  Laterality: N/A;  . TRICEPS TENDON REPAIR Bilateral 08/2005  . UPPER GASTROINTESTINAL ENDOSCOPY      No Known Allergies  Current Outpatient Medications  Medication Sig Dispense Refill  . albuterol (VENTOLIN HFA) 108 (90 Base) MCG/ACT inhaler TAKE 2 PUFFS BY MOUTH EVERY 6 HOURS AS NEEDED FOR WHEEZE OR SHORTNESS OF BREATH 18 g 0  . allopurinol (ZYLOPRIM) 100 MG tablet TAKE 1 TABLET BY MOUTH ONCE DAILY (Patient taking differently: Take 100 mg by mouth 2 (two) times daily. ) 30 tablet 0  . ALPRAZolam (XANAX) 0.25 MG  tablet Take 1 tablet (0.25 mg total) by mouth daily as needed for anxiety. 30 tablet 1  . amLODipine (NORVASC) 10 MG tablet Take 10 mg by mouth at bedtime.     Marland Kitchen aspirin EC 81 MG tablet Take 81 mg by mouth daily.    . benzonatate (TESSALON) 100 MG capsule Take 1 capsule (100 mg total) by mouth 2 (two) times daily as needed for cough. (Patient not taking: Reported on 10/23/2019) 20 capsule 0  . chlorpheniramine-HYDROcodone (TUSSIONEX PENNKINETIC ER) 10-8 MG/5ML SUER Take 5 mLs by mouth every 12 (twelve) hours as needed for cough. (Patient not taking: Reported on 10/23/2019) 140 mL 0  . ciprofloxacin (CIPRO) 500 MG tablet Take 1 tablet (500 mg total) by mouth 2 (two) times daily. (Patient not taking: Reported on 10/23/2019) 20 tablet 0  . diphenhydrAMINE (BENADRYL) 25 MG tablet Take 25 mg by mouth every 6 (six) hours as needed.    . ferric citrate (AURYXIA) 1 GM 210  MG(Fe) tablet Take 420 mg by mouth 3 (three) times daily with meals.     . lidocaine-prilocaine (EMLA) cream Apply 1 application topically as needed. 30 g 5  . methocarbamol (ROBAXIN) 750 MG tablet Take 750 mg by mouth at bedtime as needed for muscle spasms.    . Methoxy PEG-Epoetin Beta (MIRCERA IJ) Mircera    . Multiple Vitamins-Minerals (MULTIVITAMIN WITH MINERALS) tablet Take 1 tablet by mouth daily.      Marland Kitchen oxyCODONE-acetaminophen (PERCOCET) 5-325 MG tablet Take 1 tablet by mouth every 8 (eight) hours as needed for moderate pain. From metastatic cancer and post-operatively. 40 tablet 0  . Oxymetazoline HCl (VICKS SINEX NA) Place 1 spray into the nose daily as needed (congestion).    . pantoprazole (PROTONIX) 40 MG tablet Take 1 tablet (40 mg total) by mouth 2 (two) times daily. 112 tablet 0   No current facility-administered medications for this visit.    Family History  Problem Relation Age of Onset  . Hypertension Mother   . Heart disease Mother   . Diabetes Sister   . Diabetes Brother   . Prostate cancer Maternal Grandfather     . Diabetes Sister   . Prostate cancer Maternal Uncle   . Colon polyps Neg Hx   . Esophageal cancer Neg Hx   . Stomach cancer Neg Hx   . Rectal cancer Neg Hx     Social History   Socioeconomic History  . Marital status: Married    Spouse name: Not on file  . Number of children: 5  . Years of education: Not on file  . Highest education level: Not on file  Occupational History  . Occupation: retired  Tobacco Use  . Smoking status: Former Smoker    Packs/day: 0.50    Years: 6.00    Pack years: 3.00    Types: Cigarettes, Pipe, Cigars    Quit date: 07/28/2019    Years since quitting: 0.6  . Smokeless tobacco: Never Used  . Tobacco comment: occ cigar  Vaping Use  . Vaping Use: Never used  Substance and Sexual Activity  . Alcohol use: Yes    Comment: 2 drinks per weekend  . Drug use: No  . Sexual activity: Yes  Other Topics Concern  . Not on file  Social History Narrative  . Not on file   Social Determinants of Health   Financial Resource Strain:   . Difficulty of Paying Living Expenses: Not on file  Food Insecurity:   . Worried About Charity fundraiser in the Last Year: Not on file  . Ran Out of Food in the Last Year: Not on file  Transportation Needs:   . Lack of Transportation (Medical): Not on file  . Lack of Transportation (Non-Medical): Not on file  Physical Activity:   . Days of Exercise per Week: Not on file  . Minutes of Exercise per Session: Not on file  Stress:   . Feeling of Stress : Not on file  Social Connections:   . Frequency of Communication with Friends and Family: Not on file  . Frequency of Social Gatherings with Friends and Family: Not on file  . Attends Religious Services: Not on file  . Active Member of Clubs or Organizations: Not on file  . Attends Archivist Meetings: Not on file  . Marital Status: Not on file  Intimate Partner Violence:   . Fear of Current or Ex-Partner: Not on file  . Emotionally Abused: Not on  file  .  Physically Abused: Not on file  . Sexually Abused: Not on file     ROS: [x]  Positive   [ ]  Negative   [ ]  All sytems reviewed and are negative  Cardiac: []  chest pain/pressure []  SOB []  DOE  Vascular: []  pain in legs while walking []  pain in feet when lying flat []  hx of DVT []  swelling in legs  Pulmonary: [x]  OSA  Neurologic: []  weakness in []  arms []  legs []  numbness in []  arms []  legs [] difficulty speaking or slurred speech []  temporary loss of vision in one eye []  dizziness  Hematologic: [x]  anemia  GI []  GERD  GU: [x]  CKD/renal failure  [x]  HD---[]  M/W/F [x]  T/T/S [x]  Hx of prostate cancer.  Underwent hormone therapy   Psychiatric: []  hx of major depression  Integumentary: []  rashes []  ulcers  Constitutional: []  fever []  chills  PHYSICAL EXAMINATION:  Today's Vitals   03/05/20 1341  BP: 136/89  Pulse: (!) 101  Resp: 20  Temp: 97.8 F (36.6 C)  TempSrc: Temporal  SpO2: 97%  Weight: 293 lb 6.4 oz (133.1 kg)  Height: 6\' 2"  (1.88 m)  PainSc: 0-No pain   Body mass index is 37.67 kg/m.   General:  WDWN male in NAD Gait: Normal HENT: WNL Pulmonary: normal non-labored breathing , without Rales, rhonchi,  wheezing Cardiac: regular, without  Murmur without carotid bruits Skin: without rashes, without ulcers  Vascular Exam/Pulses:   Right Left  Radial 2+ (normal) 2+ (normal)  Ulnar Unable to palpate Unable to palpate   Extremities:  The left upper arm fistula is easily palpable with thrill.  5/5 left hand grip.   Musculoskeletal: no muscle wasting or atrophy  Neurologic: A&O X 3; Speech is fluent/normal   Non-Invasive Vascular Imaging:   Upper Extremity Vein Mapping on 03/05/2020: OUTFLOW VEINPSV (cm/s)Diameter (cm)Depth (cm)    Describe       +------------+----------+-------------+----------+------------------------+  Clavicle    265                              +------------+----------+-------------+----------+------------------------+  Shoulder    740                change in Diameter   +------------+----------+-------------+----------+------------------------+  Prox UA     582    0.57     0.78    change in Diameter   +------------+----------+-------------+----------+------------------------+  Mid UA     230    0.84     0.36                +------------+----------+-------------+----------+------------------------+ Dist UA     150    1.04     0.33                +------------+----------+-------------+----------+------------------------+  AC Fossa    252    1.42     0.17  post stenotic  turbulence  +------------+----------+-------------+----------+------------------------+   >50% stenosis seen in upper arm segment of cephalic vein at shoulder.      Summary:  Arteriovenous fistula-Elevated velocities and a velocity ratio of greater  than 3 noted.  >50% stenosis in the cephalic vein at the upper arm/shoulder.   ASSESSMENT/PLAN: 59 y.o. male with  left BC AVF on 03/15/2019 by Dr. Carlis Abbott.  He had a TDC placed on 05/06/2019 by Dr. Donnetta Hutching here for evaluation of his hemodialysis access  -pt's duplex today reveals diameter changed in the upper arm.  Pt  underwent fistulogram by Dr. Augustin Coupe around March per patient and had balloon angioplasty.  The pt denies that a stent was placed.   -discussed with pt today that there is a narrowing and recommend fistulogram to evaluate and possible intervention.  He would like to wait a few weeks as he has surgery for replacing his ureteral stents with Dr. Tresa Moore in a couple of weeks.   -his catheter is working well.  Discussed with him that hopefully we can get his fistula working so that his catheter can be removed as it is a risk for infection.     Leontine Locket, [facility] [phone]  Clinic MD:   Oneida Alar

## 2020-03-11 ENCOUNTER — Telehealth: Payer: Self-pay

## 2020-03-11 NOTE — Telephone Encounter (Signed)
Pt was called back to find out what he needed. [place]

## 2020-03-16 ENCOUNTER — Telehealth: Payer: Self-pay

## 2020-03-16 NOTE — Telephone Encounter (Signed)
Pt would like to know more about more about new med Xandi. He has a virtual appt coming but just wanted to give you a heads up on why he is having the virtual. [facility]

## 2020-03-16 NOTE — Telephone Encounter (Signed)
Error

## 2020-03-20 ENCOUNTER — Encounter (HOSPITAL_BASED_OUTPATIENT_CLINIC_OR_DEPARTMENT_OTHER): Payer: Self-pay | Admitting: Physician Assistant

## 2020-03-20 ENCOUNTER — Encounter (HOSPITAL_BASED_OUTPATIENT_CLINIC_OR_DEPARTMENT_OTHER): Payer: Self-pay | Admitting: Urology

## 2020-03-20 ENCOUNTER — Other Ambulatory Visit: Payer: Self-pay

## 2020-03-20 NOTE — Progress Notes (Addendum)
ADDENDUM:  Chart reviewed by anesthesia, Konrad Felix PA, since due to pt newly dx AFib and ED provider recommended that pt to follow up with cardiology appointment and pt did not follow up with any cardiology as recommended pt will need clearance prior to surgery.  Called and left message for [place], [place] scheduler for Dr Tresa Moore, about pt will need to see cardiology prior to surgery.   Spoke w/ via phone for pre-op interview--- PT Lab needs dos----  [place]              Lab results------ current ekg/ cxr in epic/ chart COVID test ------ 03-23-2020 @ 0905 Arrive at ------- 0630 NPO after MN NO Solid Food.  Clear liquids from MN until--- 0530 Medications to take morning of surgery ----- Protonix, Allopurinol, Sensipar Diabetic medication ----- n/a Patient Special Instructions ----- asked to bring rescue inhaler dos Pre-Op special Istructions ----- n/a  Patient verbalized understanding of instructions that were given at this phone interview. Patient denies shortness of breath, chest pain, fever, cough at this phone interview.   Anesthesia Review:  ESRD w/ HD secondary to uropathy obstruction d/t prostate cancer (dialysis @ [place] ([place]), T/TH/S; HTN;  OSA no cpap.  Pt denies any sob/ difficulty breathing.  Pt had ED visit 10-05-2019 for heart racing/ sob/ fatigue. Pt ED note Aflutter versus SVT versus Afib, final dx PAF pt to f/u cardiology. Asked pt about if he followed up with cardiology / pcp.  Pt stated he followed by with his pcp but was unable to give when and stated he did see cardiology at [facility] but able to remember when.  In epic Afib clinic to make appointment but had leave a message lov w/ pcp 10-23-2019 , did not address afib.  Currently has runny nose/ nasal congestion , stated started 2 days ago (03-18-2020).Marland Kitchen denied fever, chills, cough, body aches, nausea.   Pt stated last used rescue inhaler 1 week ago for wheezing. Pt has appointment for covid test 03-23-2020.   Pt had vascular visit in person 03-05-2020, per assessment pt stable.  Chart to be reviewed by anesthesia, Konrad Felix PA.  PCP:  Dr Lenna Sciara. [employer] (lov 10-23-2019 epic) Cardiologist :  No Vascular:  Dr Carlis Abbott (lov 03-05-2020 epic) Nephrologist:  Dr Hollie Salk @ [facility] Chest x-ray :  10-05-2019 epic EKG :  10-05-2019  epic Echo :  no Stress test: no Cardiac Cath :  no Activity level:  Sob with exertion, pt stated this is normal Sleep Study/ CPAP :  YES/ does not use Fasting Blood Sugar :      / Checks Blood Sugar -- times a day:   N/A Blood Thinner/ Instructions /Last Dose:  NO ASA / Instructions/ Last Dose :  ASA 81mg /  Per pt unsure if to stop or not. Advised pt to call Dr Tresa Moore and ask / or he can look at his information sent in mail to him from Dr Tresa Moore. Verbalized understanding he needs to get instrucions.

## 2020-03-23 ENCOUNTER — Other Ambulatory Visit: Payer: Self-pay

## 2020-03-23 ENCOUNTER — Other Ambulatory Visit (HOSPITAL_COMMUNITY)
Admission: RE | Admit: 2020-03-23 | Discharge: 2020-03-23 | Disposition: A | Discharge status: Home / Self Care | Payer: 59 | Source: Ambulatory Visit | Attending: Urology | Admitting: Urology

## 2020-03-23 ENCOUNTER — Telehealth (HOSPITAL_COMMUNITY): Payer: Self-pay | Admitting: *Deleted

## 2020-03-23 DIAGNOSIS — Z20822 Contact with and (suspected) exposure to covid-19: Secondary | ICD-10-CM | POA: Diagnosis not present

## 2020-03-23 DIAGNOSIS — Z01812 Encounter for preprocedural laboratory examination: Secondary | ICD-10-CM | POA: Insufficient documentation

## 2020-03-23 LAB — SARS CORONAVIRUS 2 (TAT 6-24 HRS): SARS Coronavirus 2: NEGATIVE

## 2020-03-23 NOTE — Telephone Encounter (Signed)
Received fax from [facility] urology needing cardiac clearance prior to urology procedure. Pt will need to see general cardiology for clearance.  Note sent back to [facility] to this effect.

## 2020-03-24 ENCOUNTER — Encounter: Payer: Self-pay | Admitting: Family Medicine

## 2020-03-24 ENCOUNTER — Other Ambulatory Visit: Payer: Self-pay

## 2020-03-24 ENCOUNTER — Telehealth (INDEPENDENT_AMBULATORY_CARE_PROVIDER_SITE_OTHER): Payer: 59 | Admitting: Family Medicine

## 2020-03-24 VITALS — BP 138/86 | Temp 97.6°F | Wt 294.0 lb

## 2020-03-24 DIAGNOSIS — J0100 Acute maxillary sinusitis, unspecified: Secondary | ICD-10-CM

## 2020-03-24 DIAGNOSIS — C61 Malignant neoplasm of prostate: Secondary | ICD-10-CM

## 2020-03-24 MED ORDER — AMOXICILLIN 875 MG PO TABS: 875.0000 mg | ORAL_TABLET | Freq: Two times a day (BID) | ORAL | 0 refills | Status: DC

## 2020-03-24 NOTE — Progress Notes (Signed)
   Subjective:    Patient ID: Devon Cook, male    DOB: 02-Sep-1960, 59 y.o.   MRN: [id number]  HPI I connected with  XAVIAR LUNN on 03/24/20 by a video enabled telemedicine application and verified that I am speaking with the correct person using two identifiers.  caregility used.  He is at home and I am in my office I discussed the limitations of evaluation and management by telemedicine. The patient expressed understanding and agreed to proceed. He has a 1 week history of difficulty with rhinorrhea, nasal congestion, sinus pressure, left upper tooth pain but no fever or chills or sore throat.  He has had trouble with this in the past and responded to antibiotics. He also has questions concerning starting a new medication called Xtandi.  He read that it had lots of side effects and he is wanted my input into all of this.  Review of Systems     Objective:   Physical Exam Alert and in no distress otherwise not examined      Assessment & Plan:  Prostate cancer ([place])  Acute non-recurrent maxillary sinusitis - Plan: amoxicillin (AMOXIL) 875 MG tablet I will give him Amoxil to help with the sinus infection as it does sound indeed like he has this. I then discussed the Xtandi.  I discussed the fact that there are risks versus benefits of all medications and did go over several of them that are listed in the product information.  I explained that there are potential risks is and if he does have any the problems that were listed, let his urologist know and I will certainly also work with him.  I explained that I thought the benefit outweighed the risks.

## 2020-03-25 ENCOUNTER — Ambulatory Visit (HOSPITAL_BASED_OUTPATIENT_CLINIC_OR_DEPARTMENT_OTHER): Admission: RE | Admit: 2020-03-25 | Payer: 59 | Source: Home / Self Care | Admitting: Urology

## 2020-03-25 HISTORY — DX: Other specified symptoms and signs involving the circulatory and respiratory systems: R09.89

## 2020-03-25 HISTORY — DX: Nasal congestion: R09.81

## 2020-03-25 SURGERY — CYSTOSCOPY, WITH RETROGRADE PYELOGRAM AND URETERAL STENT INSERTION
Anesthesia: General | Laterality: Bilateral

## 2020-03-26 ENCOUNTER — Ambulatory Visit: Payer: 59 | Admitting: Interventional Cardiology

## 2020-03-27 ENCOUNTER — Ambulatory Visit (HOSPITAL_COMMUNITY): Admission: RE | Admit: 2020-03-27 | Payer: 59 | Source: Ambulatory Visit | Admitting: Vascular Surgery

## 2020-03-27 ENCOUNTER — Encounter (HOSPITAL_COMMUNITY): Admission: RE | Payer: Self-pay | Source: Ambulatory Visit

## 2020-03-27 SURGERY — A/V FISTULAGRAM
Anesthesia: LOCAL | Laterality: Left

## 2020-04-08 NOTE — Progress Notes (Signed)
Cardiology Office Note:    Date:  04/08/2020   ID:  Devon Cook, DOB [birth year], MRN [id number]  PCP:  [name], MD  [facility] Cardiologist:  No primary care provider on file.  [facility] Electrophysiologist:  None   Referring MD: [name], MD    History of Present Illness:    Devon Cook is a 59 y.o. male with a hx of ESRD on HD, metastatic prostate cancer, HTN, HLD, OSA on CPAP, and Afib who was referred by Dr. Garlan Fillers for pre-operative evaluation prior to bilateral ureteral stent replacement.  Patient states that he feels well. Denies any chest pain, SOB, DOE or palpitations. Able to walk 2 flights of stairs without issues. Walks his dog without issues. He is active at baseline and denies any symptoms with activity.  Of note, patient was in the ED on 10/05/2019 with palpitations that developed at HD. Thought to have SVT vs aflutter. Strips look like Aflutter on my review. His CHADs-vasc was deemed to be 1 at that time and he was continued on ASA. He was supposed to have Cardiology follow-up, but unfortunately this did not occur. He is now planned for ureteral stent placement and Urology would like cardiac clearance prior to the operation.  Patient denies any further episodes of palpitations and has been tolerating HD well.  Labs 10/05/2019:  Na 140, K 3.2, Cr 5.49 on HD, Mg 2.0, WBC 10.3, HgB 9.0, AST 24, ALT 21, TSH 2.732  Past Medical History:  Diagnosis Date  . Anemia, chronic renal failure    followed by dr Hollie Salk--- dx 05/ 2019---  treated with Iron infusions and procrit  . Bilateral hydronephrosis urologist-- dr Tresa Moore   malignant-- chronic;  treated with ureteral stents  . Chronic gastritis    07-17-2019 and duodenitis  . ED (erectile dysfunction)   . Edema of both lower extremities    08-30-2019  per pt edema has much improved since started hemodialysis  . 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 GERD (gastroesophageal reflux disease)   . Gout    03-20-2020 per pt unsure when last episode but it has been yrs  . Herpes genitalis in men   . History of adenomatous polyp of colon   . History of hepatitis C 2017   dx 11/ 207-- treated with Harvoni (in epic 04/ 2019 lab result non-detectable)  . History of lower GI bleeding 11/2017  . Hypertension    followed by pcp  . Nasal congestion    03-20-2020 per pt started two day ago  . OSA (obstructive sleep apnea)    severe osa w/ nocturnal hyoxemia per study 06-19-2019,  pt given cpap ( however on 08-30-2019 per pt having issues with mask fitting and on a new one so he not using the cpap);   (03-20-2020 still not using cpap, no mask)  . Prostate 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 [facility]) urologist-- dr Tresa Moore   dx 06/ 2019-- advanced w/ mets to bone and pelvic lymphadeopathy  . Runny nose    03-20-2020 per pt started two days ago  . S/P arteriovenous (AV) fistula creation followed by dr Carlis Abbott (vascular)   03-15-2019  by dr Carlis Abbott, left branhiocephallic  . Secondary hyperparathyroidism of renal origin ([place])   . Wears glasses     Past Surgical  History:  Procedure Laterality Date  . AV FISTULA PLACEMENT Left 03/15/2019   Procedure: ARTERIOVENOUS (AV) FISTULA CREATION LEFT ARM;  Surgeon: Marty Heck, MD;  Location: [place];  Service: Vascular;  Laterality: Left;  . COLONOSCOPY    . CYSTOSCOPY W/ URETERAL STENT PLACEMENT Bilateral 12/08/2017   Procedure: CYSTOSCOPY WITH RETROGRADE PYELOGRAM/URETERAL STENT PLACEMENT;  Surgeon: Alexis Frock, MD;  Location: [facility];  Service: Urology;  Laterality: Bilateral;  . CYSTOSCOPY W/ URETERAL STENT PLACEMENT Bilateral 04/20/2018   Procedure: CYSTOSCOPY WITH STENT REPLACEMENT;  Surgeon: Alexis Frock, MD;  Location: [facility];   Service: Urology;  Laterality: Bilateral;  . CYSTOSCOPY W/ URETERAL STENT PLACEMENT Bilateral 08/10/2018   Procedure: CYSTOSCOPY WITH STENT REPLACEMENT, BILATERAL RETROGRADES;  Surgeon: Alexis Frock, MD;  Location: [facility];  Service: Urology;  Laterality: Bilateral;  45 MINS  . CYSTOSCOPY W/ URETERAL STENT PLACEMENT Bilateral 01/18/2019   Procedure: CYSTOSCOPY WITH RETROGRADE PYELOGRAM/URETERAL STENT PLACEMENT;  Surgeon: Alexis Frock, MD;  Location: [facility];  Service: Urology;  Laterality: Bilateral;  . CYSTOSCOPY W/ URETERAL STENT PLACEMENT Bilateral 05/24/2019   Procedure: CYSTOSCOPY WITH RETROGRADE PYELOGRAM/BILATERAL URETERAL STENT EXCHANGE;  Surgeon: Alexis Frock, MD;  Location: [facility];  Service: Urology;  Laterality: Bilateral;  . CYSTOSCOPY W/ URETERAL STENT PLACEMENT Bilateral 09/06/2019   Procedure: CYSTOSCOPY WITH RETROGRADE PYELOGRAM/URETERAL STENT EXCHANGE;  Surgeon: Alexis Frock, MD;  Location: [facility];  Service: Urology;  Laterality: Bilateral;  45 MINS  . INSERTION OF DIALYSIS CATHETER Right 05/06/2019   Procedure: INSERTION OF TUNNELED  DIALYSIS CATHETER;  Surgeon: Rosetta Posner, MD;  Location: [place];  Service: Vascular;  Laterality: Right;  . IR NEPHROSTOMY PLACEMENT LEFT  11/15/2017  . IR NEPHROSTOMY PLACEMENT RIGHT  11/15/2017  . PROSTATE BIOPSY N/A 12/08/2017   Procedure: BIOPSY TRANSRECTAL ULTRASONIC PROSTATE (TUBP), PERINEAL BIOPSY;  Surgeon: Alexis Frock, MD;  Location[facility];  Service: Urology;  Laterality: N/A;  . TRANSURETHRAL RESECTION OF BLADDER TUMOR N/A 12/08/2017   Procedure: TRANSURETHRAL RESECTION OF BLADDER TUMOR (TURBT);  Surgeon: Alexis Frock, MD;  Location[facility];  Service: Urology;  Laterality: N/A;  . TRICEPS TENDON REPAIR Bilateral 08/2005  . UPPER GASTROINTESTINAL ENDOSCOPY      Current Medications: No outpatient medications have been marked as taking for the 04/10/20  encounter (Appointment) with Freada Bergeron, MD.     Allergies:   Patient has no known allergies.   Social History   Socioeconomic History  . Marital status: Married    Spouse name: Not on file  . Number of children: 5  . Years of education: Not on file  . Highest education level: Not on file  Occupational History  . Occupation: retired  Tobacco Use  . Smoking status: Current Some Day Smoker    Packs/day: 0.50    Years: 6.00    Pack years: 3.00    Types: Cigarettes, Pipe, Cigars    Last attempt to quit: 07/28/2019    Years since quitting: 0.6  . Smokeless tobacco: Never Used  . Tobacco comment: 03-20-2020  per pt 2 cigars per week;  quit cigarettes 07-28-2019  Vaping Use  . Vaping Use: Never used  Substance and Sexual Activity  . Alcohol use: Yes    Comment: 2 drinks per weekend  . Drug use: No  . Sexual activity: Yes  Other Topics Concern  . Not on file  Social History Narrative  . Not on file   Social Determinants of  Health   Financial Resource Strain:   . Difficulty of Paying Living Expenses: Not on file  Food Insecurity:   . Worried About Charity fundraiser in the Last Year: Not on file  . Ran Out of Food in the Last Year: Not on file  Transportation Needs:   . Lack of Transportation (Medical): Not on file  . Lack of Transportation (Non-Medical): Not on file  Physical Activity:   . Days of Exercise per Week: Not on file  . Minutes of Exercise per Session: Not on file  Stress:   . Feeling of Stress : Not on file  Social Connections:   . Frequency of Communication with Friends and Family: Not on file  . Frequency of Social Gatherings with Friends and Family: Not on file  . Attends Religious Services: Not on file  . Active Member of Clubs or Organizations: Not on file  . Attends Archivist Meetings: Not on file  . Marital Status: Not on file     Family History: The patient's family history includes Diabetes in his brother, sister, and  sister; Heart disease in his mother; Hypertension in his mother; Prostate cancer in his maternal grandfather and maternal uncle. There is no history of Colon polyps, Esophageal cancer, Stomach cancer, or Rectal cancer.  ROS:   Please see the history of present illness.    The patient denies chest pain, chest pressure, dyspnea at rest or with exertion, palpitations, PND, orthopnea, or leg swelling. Denies cough, fever, chills. Denies nausea, vomiting. Denies syncope or presyncope. Denies dizziness or lightheadedness. Denies snoring.  EKGs/Labs/Other Studies Reviewed:    The following studies were reviewed today: Sleep study 06/2019: IMPRESSIONS  - Severe obstructive sleep apnea occurred during this study (AHI  = 99.8/h).  - No significant central sleep apnea occurred during this study  (CAI = 0.0/h).  - Oxygen desaturation was noted during this study (Min O2 = 64%).  Mean sat 92%.  - Time with O2 saturation 89% or less was 79.9 minutes.  - Patient snored.   LE Ultrasound 11/2017: Right: There is no evidence of superficial venous thrombosis.There is no  evidence of deep vein thrombosis in the lower extremity. However, portions  of this examination were limited- see technologist comments above. No  cystic structure found in the  popliteal fossa.  Left: There is no evidence of deep vein thrombosis in the lower extremity.  However, portions of this examination were limited- see technologist  comments above.There is no evidence of superficial venous thrombosis. No  cystic structure found in the  popliteal fossa.   EKG:  EKG is  ordered today.  The ekg ordered today demonstrates NSR with HR 86  Recent Labs: 10/05/2019: ALT 21; B Natriuretic Peptide 41.3; BUN 30; Creatinine, Ser 5.49; Hemoglobin 9.0; Magnesium 2.0; Platelets 331; Potassium 3.2; Sodium 140; TSH 2.732  Recent Lipid Panel    Component Value Date/Time   CHOL 98 (L) 11/13/2017 0959   TRIG 72 11/13/2017 0959   HDL 32 (L)  11/13/2017 0959   CHOLHDL 3.1 11/13/2017 0959   CHOLHDL 2.8 09/07/2015 0001   VLDL 19 09/07/2015 0001   LDLCALC 52 11/13/2017 0959    Physical Exam:    VS:  There were no vitals taken for this visit.    Wt Readings from Last 3 Encounters:  03/24/20 294 lb (133.4 kg)  03/05/20 293 lb 6.4 oz (133.1 kg)  11/20/19 300 lb (136.1 kg)     GEN:  Well nourished,  well developed in no acute distress HEENT: Normal NECK: No JVD; No carotid bruits LYMPHATICS: No lymphadenopathy CARDIAC: RRR, no murmurs, rubs, gallops RESPIRATORY:  Clear to auscultation without rales, wheezing or rhonchi  ABDOMEN: Soft, non-tender, non-distended MUSCULOSKELETAL:  No edema; No deformity  SKIN: Warm and dry NEUROLOGIC:  Alert and oriented x 3 PSYCHIATRIC:  Normal affect   ASSESSMENT:    No diagnosis found. PLAN:    In order of problems listed above:  #Pre-operative evaluation prior to bilateral utereral stent placement Patient with good functional capacity and able to perform >4METs without issue. No chest pain, SOB, DOE, or exercise intolerance. Given history of ? Afib, will check TTE and start BB at this time -Check TTE -Start metop 25mg  XL -No stress testing needed as patient asymptomatic with good functional capacity and stress testing would not change management  #Palpitations: #Suspected Aflutter: Isolated event per ED report with no further issues. CHADs-vasc 1 for hypertension although ESRD does increase risk and may merit anticoagulation if any further episodes.  -Check TTE -Start metop 25mg  XL -Continue ASA -TSH 0.83 (normal) -If recurs, will have a discussion for risk vs benefits of anticoagulation at that time; would not need bridging for any operation given low CHADs-vasc -May consider ziopatch in future to determine if any asymptomatic episodes  #HTN: Elevated BP 140/90 in the office today. Monitored closely at HD. -Will monitor at home x 5days and send in results -Likely needs  some BP room in order to tolerate HD -Continue amlodipine 10mg   -Start metoprolol 25mg  XL  #ESRD on HD: #Metastatic prostate cancer Secondary to obstructive uropathy in the setting of metastatic prostate cancer. -Continue HD as scheduled -Follow-up with urology as scheduled  Medication Adjustments/Labs and Tests Ordered: Current medicines are reviewed at length with the patient today.  Concerns regarding medicines are outlined above.  No orders of the defined types were placed in this encounter.  No orders of the defined types were placed in this encounter.   There are no Patient Instructions on file for this visit.   Signed, Freada Bergeron, MD  04/08/2020 8:16 AM    [facility]

## 2020-04-10 ENCOUNTER — Ambulatory Visit (INDEPENDENT_AMBULATORY_CARE_PROVIDER_SITE_OTHER): Payer: 59 | Admitting: Cardiology

## 2020-04-10 ENCOUNTER — Other Ambulatory Visit: Payer: Self-pay

## 2020-04-10 ENCOUNTER — Encounter: Payer: Self-pay | Admitting: Cardiology

## 2020-04-10 VITALS — BP 140/72 | HR 86 | Ht 74.0 in | Wt 297.4 lb

## 2020-04-10 DIAGNOSIS — R002 Palpitations: Secondary | ICD-10-CM

## 2020-04-10 MED ORDER — METOPROLOL SUCCINATE ER 25 MG PO TB24: 25.0000 mg | ORAL_TABLET | Freq: Every day | ORAL | 3 refills | Status: DC

## 2020-04-10 NOTE — Patient Instructions (Addendum)
Medication Instructions:  START TAKING:    METOPROLOL SUCCINATE (TOPROL XL) 25 MG ONCE A DAY    *If you need a refill on your cardiac medications before your next appointment, please call your pharmacy*   Lab Work: .NONE ORDERED  TODAY   If you have labs (blood work) drawn today and your tests are completely normal, you will receive your results only by: Marland Kitchen MyChart Message (if you have MyChart) OR . A paper copy in the mail If you have any lab test that is abnormal or we need to change your treatment, we will call you to review the results.   Testing/Procedures:Your physician has requested that you have an echocardiogram. Echocardiography is a painless test that uses sound waves to create images of your heart. It provides your doctor with information about the size and shape of your heart and how well your heart's chambers and valves are working. This procedure takes approximately one hour. There are no restrictions for this procedure.   Follow-Up: At [facility], you and your health needs are our priority.  As part of our continuing mission to provide you with exceptional heart care, we have created designated Provider Care Teams.  These Care Teams include your primary Cardiologist (physician) and Advanced Practice Providers (APPs -  Physician Assistants and Nurse Practitioners) who all work together to provide you with the care you need, when you need it.  We recommend signing up for the patient portal called "MyChart".  Sign up information is provided on this After Visit Summary.  MyChart is used to connect with patients for Virtual Visits (Telemedicine).  Patients are able to view lab/test results, encounter notes, upcoming appointments, etc.  Non-urgent messages can be sent to your provider as well.   To learn more about what you can do with MyChart, go to [email or web address].    Your next appointment:   3 month(s)  The format for your next appointment:   In  Person  Provider:   Gwyndolyn Kaufman, MD   Other Instructions

## 2020-04-14 ENCOUNTER — Telehealth: Payer: Self-pay | Admitting: Cardiology

## 2020-04-14 NOTE — Telephone Encounter (Signed)
Devon Cook 59 year old male was last seen in the office on 04/10/2020 for preoperative cardiac evaluation.  We received request for same urethral stent replacement procedure today.  On reviewing your note I sitting you order TTE and started metoprolol.  I will defer cardiac clearance to you at this time.  When  TTE results will you send your note to requesting office?  I will remove this patient from preoperative pool.   Thank you for your help.  Jossie Ng. Jacara Benito NP-C    04/14/2020, 1:43 PM [facility] [place] [address] Office [phone] Fax [phone]

## 2020-04-14 NOTE — Telephone Encounter (Signed)
   [facility] Pre-operative Risk Assessment    HEARTCARE STAFF: - Please ensure there is not already an duplicate clearance open for this procedure. - Under Visit Info/Reason for Call, type in Other and utilize the format Clearance MM/DD/YY or Clearance TBD. Do not use dashes or single digits. - If request is for dental extraction, please clarify the # of teeth to be extracted.  Request for surgical clearance:  1. What type of surgery is being performed? Ureteral Stent Change   2. When is this surgery scheduled? TBD   3. What type of clearance is required (medical clearance vs. Pharmacy clearance to hold med vs. Both)? Both   4. Are there any medications that need to be held prior to surgery and how long? Aspirin 5 days prior   5. Practice name and name of physician performing surgery? [facility], Dr. Alexis Frock   6. What is the office phone number? [phone]   7.   What is the office fax number? [phone]  8.   Anesthesia type (None, local, MAC, general) ? [place] 04/14/2020, 1:27 PM  _________________________________________________________________   (provider comments below)

## 2020-04-14 NOTE — Telephone Encounter (Signed)
Will send updated note once TTE completed and forward pre-operative clearance to forwarding office. Thank you!  Gwyndolyn Kaufman, MD

## 2020-04-17 NOTE — Telephone Encounter (Signed)
Awaiting echo on 10/25

## 2020-04-22 ENCOUNTER — Telehealth: Payer: Self-pay

## 2020-04-22 NOTE — Telephone Encounter (Signed)
Contacted pt to r/s LUA fistulogram as requested from dialysis center. Pt stated he's in the process of trying to get cardiac clearance for another surgical procedure scheduled for 05/08/20 and will contact our office back to schedule after Thanksgiving.

## 2020-04-27 ENCOUNTER — Encounter: Payer: Self-pay | Admitting: Family Medicine

## 2020-04-27 ENCOUNTER — Ambulatory Visit (HOSPITAL_COMMUNITY): Payer: 59 | Attending: Cardiology

## 2020-04-27 ENCOUNTER — Other Ambulatory Visit: Payer: Self-pay

## 2020-04-27 DIAGNOSIS — R002 Palpitations: Secondary | ICD-10-CM | POA: Diagnosis not present

## 2020-04-27 LAB — ECHOCARDIOGRAM COMPLETE
Area-P 1/2: 2.93 cm2
S' Lateral: 4 cm

## 2020-04-27 NOTE — Telephone Encounter (Signed)
   Primary Cardiologist: Dr. Johney Frame  Chart reviewed as part of pre-operative protocol coverage. Patient was recently seen by Dr. Johney Frame on 04/10/2020 for pre-op evaluation at which time he was doing well from a cardiac standpoint and stated he was able to complete >4.0 METS without any problems. Echo was ordered given isolated episode of possible atrial flutter earlier this year. Echo on 04/27/2020 showed LVEF of 45-50% with moderately elevated PASP and mildly dilated aorta. Per Dr. Johney Frame, he is medically optimized form a cardiovascular standpoint and is OK to proceed with surgery.  Regarding Aspirin therapy, we typical recommend continuation of Aspirin throughout the perioperative period.  However, if the surgeon feels that cessation of Aspirin is required in the perioperative period, it may be stopped 5-7 days prior to surgery with a plan to resume it as soon as felt to be feasible from a surgical standpoint in the post-operative period.  I will route this recommendation to the requesting party via Epic fax function and remove from pre-op pool.  Please call with questions.  Darreld Mclean, PA-C 04/27/2020, 4:12 PM

## 2020-04-29 ENCOUNTER — Other Ambulatory Visit: Payer: Self-pay | Admitting: Urology

## 2020-05-01 ENCOUNTER — Other Ambulatory Visit: Payer: Self-pay

## 2020-05-01 ENCOUNTER — Encounter (HOSPITAL_BASED_OUTPATIENT_CLINIC_OR_DEPARTMENT_OTHER): Payer: Self-pay | Admitting: Urology

## 2020-05-01 NOTE — Progress Notes (Addendum)
ADDENDUM:  Chart reviewed by anesthesia , Konrad Felix PA,  Stated has cardiac clearance ok to proceed.   Spoke w/ via phone for pre-op interview--- PT Lab needs dos----  [place]             Lab results------ current ekg/ chart in epic/ chart COVID test ------ 05-05-2020 @ 1430 Arrive at ------- 0530 NPO after MN  Medications to take morning of surgery ----- Protonix, Allopurinol, and if needed take xanax Diabetic medication ----- n/a Patient Special Instructions ----- asked to bring rescue inhaler dos Pre-Op special Istructions -----  Pt has telephone cardiac clearance dated 04-27-2020 by Sande Rives PA in epic/ chart Patient verbalized understanding of instructions that were given at this phone interview. Patient denies shortness of breath, chest pain, fever, cough at this phone interview.   Anesthesia Review:  ESRD w/ HD secondary to uropathy obstruction d/t prostate cancer (dialysis @[facility] T/TH/S;  HTN;  OSA does use cpap.  Pt denies any cardiac s&s, sob, or difficulty breathing.  Pt was scheduled for this surgery on 03-25-2020, however, pt had ED visit 10-05-2019 (epic) and was told he needed to follow with a cardiologist due to new dx Afib.  Pt did not follow up so pt case was cancelled.  Pt has now been seen by cardiology, Dr Johney Frame on 04-10-2020 note in epic and was given clearance for surgery and do not stop ASA.  Chart to be reviewed by anesthesia Konrad Felix PA.  PCP:  Dr Glo Herring ([place] 10-23-2019 epic) Cardiologist :  Dr Gwyndolyn Kaufman ([place] 04-10-2020 epic)  Vascular:  Dr Carlis Abbott [facility] 03-05-2020 epic) [facility]rologist:  [facility] Chest x-ray :  10-05-2019 epic EKG :  04-10-2020 epic Echo : 04-27-2020 epic Stress test: no Cardiac Cath :  no Activity level:  Pt states able to do any activity without sob Sleep Study/ CPAP :  YES/  NO Fasting Blood Sugar :      / Checks Blood Sugar -- times a day:   N/A Blood Thinner/  Instructions /Last Dose:  NO ASA / Instructions/ Last Dose :  ASA 81mg /  Pt stated was given instructions from cardiologist to not stop asa prior to surgery.

## 2020-05-05 ENCOUNTER — Other Ambulatory Visit (HOSPITAL_COMMUNITY)
Admission: RE | Admit: 2020-05-05 | Discharge: 2020-05-05 | Disposition: A | Discharge status: Home / Self Care | Payer: 59 | Source: Ambulatory Visit | Attending: Urology | Admitting: Urology

## 2020-05-05 ENCOUNTER — Inpatient Hospital Stay (HOSPITAL_COMMUNITY): Admission: RE | Admit: 2020-05-05 | Payer: 59 | Source: Ambulatory Visit

## 2020-05-05 DIAGNOSIS — Z20822 Contact with and (suspected) exposure to covid-19: Secondary | ICD-10-CM | POA: Diagnosis not present

## 2020-05-05 DIAGNOSIS — Z01812 Encounter for preprocedural laboratory examination: Secondary | ICD-10-CM | POA: Insufficient documentation

## 2020-05-05 LAB — SARS CORONAVIRUS 2 (TAT 6-24 HRS): SARS Coronavirus 2: NEGATIVE

## 2020-05-07 NOTE — Anesthesia Preprocedure Evaluation (Addendum)
Anesthesia Evaluation  Patient identified by MRN, date of birth, ID band Patient awake    Reviewed: Allergy & Precautions, NPO status , Patient's Chart, lab work & pertinent test results  History of Anesthesia Complications Negative for: history of anesthetic complications  Airway Mallampati: III  TM Distance: >3 FB Neck ROM: Full  Mouth opening: Limited Mouth Opening  Dental  (+) Dental Advisory Given, Chipped,    Pulmonary sleep apnea (noncompliant with CPAP) , Current Smoker and Patient abstained from smoking., former smoker,    Pulmonary exam normal breath sounds clear to auscultation       Cardiovascular hypertension, Pt. on medications Normal cardiovascular exam Rhythm:Regular Rate:Normal  TTE 04/2020 1. Left ventricular ejection fraction, by estimation, is 45 to 50%. The left ventricle has mildly decreased function. The left ventricle demonstrates global hypokinesis. The left ventricular internal cavity size was moderately dilated. There is mild concentric left ventricular hypertrophy. Left ventricular diastolic parameters are consistent with Grade I diastolic dysfunction (impaired relaxation). Elevated left atrial pressure.  2. Right ventricular systolic function is normal. The right ventricular size is normal. There is moderately elevated pulmonary artery systolic pressure. The estimated right ventricular systolic pressure is 67.5 mmHg.  3. Left atrial size was moderately dilated.  4. Right atrial size was mildly dilated.  5. The mitral valve is normal in structure. Mild mitral valve  regurgitation. No evidence of mitral stenosis.  6. The aortic valve is normal in structure. Aortic valve regurgitation is not visualized. No aortic stenosis is present.  7. Aortic dilatation noted. There is mild dilatation of the ascending aorta, measuring 42 mm.  8. The inferior vena cava is dilated in size with <50% respiratory  variability, suggesting right atrial pressure of 15 mmHg.    Neuro/Psych negative neurological ROS  negative psych ROS   GI/Hepatic GERD  Controlled,(+) Hepatitis - (s/p Harvoni), C  Endo/Other  Morbid obesity  Renal/GU ESRF and DialysisRenal disease (dialysis TTS)    Prostate cancer w/ metastases     Musculoskeletal  Gout    Abdominal (+) + obese,   Peds  Hematology  (+) anemia ,   Anesthesia Other Findings HSV  Reproductive/Obstetrics                            Anesthesia Physical  Anesthesia Plan  ASA: III  Anesthesia Plan: General   Post-op Pain Management:    Induction: Intravenous  PONV Risk Score and Plan: 2 and Treatment may vary due to age or medical condition, Ondansetron, Dexamethasone and Midazolam  Airway Management Planned: LMA  Additional Equipment: None  Intra-op Plan:   Post-operative Plan: Extubation in OR  Informed Consent: I have reviewed the patients History and Physical, chart, labs and discussed the procedure including the risks, benefits and alternatives for the proposed anesthesia with the patient or authorized representative who has indicated his/her understanding and acceptance.     Dental advisory given  Plan Discussed with: CRNA and Anesthesiologist  Anesthesia Plan Comments:         Anesthesia Quick Evaluation

## 2020-05-08 ENCOUNTER — Encounter (HOSPITAL_BASED_OUTPATIENT_CLINIC_OR_DEPARTMENT_OTHER)
Admission: RE | Disposition: A | Discharge status: Home / Self Care | Payer: Self-pay | Source: Home / Self Care | Attending: Urology

## 2020-05-08 ENCOUNTER — Ambulatory Visit (HOSPITAL_BASED_OUTPATIENT_CLINIC_OR_DEPARTMENT_OTHER): Payer: 59 | Admitting: Physician Assistant

## 2020-05-08 ENCOUNTER — Encounter (HOSPITAL_BASED_OUTPATIENT_CLINIC_OR_DEPARTMENT_OTHER): Payer: Self-pay | Admitting: Urology

## 2020-05-08 ENCOUNTER — Ambulatory Visit (HOSPITAL_BASED_OUTPATIENT_CLINIC_OR_DEPARTMENT_OTHER)
Admission: RE | Admit: 2020-05-08 | Discharge: 2020-05-08 | Disposition: A | Discharge status: Home / Self Care | Payer: 59 | Attending: Urology | Admitting: Urology

## 2020-05-08 ENCOUNTER — Other Ambulatory Visit: Payer: Self-pay

## 2020-05-08 DIAGNOSIS — N131 Hydronephrosis with ureteral stricture, not elsewhere classified: Secondary | ICD-10-CM | POA: Insufficient documentation

## 2020-05-08 DIAGNOSIS — Z8546 Personal history of malignant neoplasm of prostate: Secondary | ICD-10-CM | POA: Diagnosis not present

## 2020-05-08 DIAGNOSIS — F17200 Nicotine dependence, unspecified, uncomplicated: Secondary | ICD-10-CM | POA: Diagnosis not present

## 2020-05-08 DIAGNOSIS — C61 Malignant neoplasm of prostate: Secondary | ICD-10-CM | POA: Diagnosis not present

## 2020-05-08 DIAGNOSIS — Z8249 Family history of ischemic heart disease and other diseases of the circulatory system: Secondary | ICD-10-CM | POA: Insufficient documentation

## 2020-05-08 DIAGNOSIS — Z992 Dependence on renal dialysis: Secondary | ICD-10-CM | POA: Diagnosis not present

## 2020-05-08 DIAGNOSIS — Z95828 Presence of other vascular implants and grafts: Secondary | ICD-10-CM | POA: Diagnosis not present

## 2020-05-08 DIAGNOSIS — N135 Crossing vessel and stricture of ureter without hydronephrosis: Secondary | ICD-10-CM | POA: Diagnosis present

## 2020-05-08 DIAGNOSIS — N186 End stage renal disease: Secondary | ICD-10-CM | POA: Insufficient documentation

## 2020-05-08 DIAGNOSIS — Z6838 Body mass index (BMI) 38.0-38.9, adult: Secondary | ICD-10-CM | POA: Insufficient documentation

## 2020-05-08 DIAGNOSIS — Z833 Family history of diabetes mellitus: Secondary | ICD-10-CM | POA: Diagnosis not present

## 2020-05-08 DIAGNOSIS — E669 Obesity, unspecified: Secondary | ICD-10-CM | POA: Insufficient documentation

## 2020-05-08 DIAGNOSIS — I12 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 stage renal disease: Secondary | ICD-10-CM | POA: Diagnosis not present

## 2020-05-08 DIAGNOSIS — Z7982 Long term (current) use of aspirin: Secondary | ICD-10-CM | POA: Insufficient documentation

## 2020-05-08 DIAGNOSIS — Z79899 Other long term (current) drug therapy: Secondary | ICD-10-CM | POA: Diagnosis not present

## 2020-05-08 DIAGNOSIS — Z8042 Family history of malignant neoplasm of prostate: Secondary | ICD-10-CM | POA: Diagnosis not present

## 2020-05-08 HISTORY — DX: Palpitations: R00.2

## 2020-05-08 HISTORY — PX: CYSTOSCOPY W/ URETERAL STENT PLACEMENT: SHX1429

## 2020-05-08 LAB — POCT I-STAT, CHEM 8
BUN: 38 mg/dL — ABNORMAL HIGH (ref 6–20)
Calcium, Ion: 1.16 mmol/L (ref 1.15–1.40)
Chloride: 99 mmol/L (ref 98–111)
Creatinine, Ser: 6.5 mg/dL — ABNORMAL HIGH (ref 0.61–1.24)
Glucose, Bld: 113 mg/dL — ABNORMAL HIGH (ref 70–99)
HCT: 32 % — ABNORMAL LOW (ref 39.0–52.0)
Hemoglobin: 10.9 g/dL — ABNORMAL LOW (ref 13.0–17.0)
Potassium: 3.9 mmol/L (ref 3.5–5.1)
Sodium: 140 mmol/L (ref 135–145)
TCO2: 26 mmol/L (ref 22–32)

## 2020-05-08 SURGERY — CYSTOSCOPY, WITH RETROGRADE PYELOGRAM AND URETERAL STENT INSERTION
Anesthesia: General | Site: Renal | Laterality: Bilateral

## 2020-05-08 MED ORDER — MIDAZOLAM HCL 2 MG/2ML IJ SOLN
INTRAMUSCULAR | Status: DC | PRN
  Administered 2020-05-08 (×2): 1 mg via INTRAVENOUS

## 2020-05-08 MED ORDER — DEXAMETHASONE SODIUM PHOSPHATE 10 MG/ML IJ SOLN
INTRAMUSCULAR | Status: AC
  Filled 2020-05-08: qty 1

## 2020-05-08 MED ORDER — ACETAMINOPHEN 500 MG PO TABS
ORAL_TABLET | ORAL | Status: AC
  Filled 2020-05-08: qty 2

## 2020-05-08 MED ORDER — MIDAZOLAM HCL 2 MG/2ML IJ SOLN
INTRAMUSCULAR | Status: AC
  Filled 2020-05-08: qty 2

## 2020-05-08 MED ORDER — SODIUM CHLORIDE 0.9 % IV SOLN: INTRAVENOUS | Status: DC

## 2020-05-08 MED ORDER — DEXAMETHASONE SODIUM PHOSPHATE 10 MG/ML IJ SOLN
INTRAMUSCULAR | Status: DC | PRN
  Administered 2020-05-08: 10 mg via INTRAVENOUS

## 2020-05-08 MED ORDER — FENTANYL CITRATE (PF) 100 MCG/2ML IJ SOLN
INTRAMUSCULAR | Status: DC | PRN
  Administered 2020-05-08: 25 ug via INTRAVENOUS
  Administered 2020-05-08: 50 ug via INTRAVENOUS

## 2020-05-08 MED ORDER — LIDOCAINE 2% (20 MG/ML) 5 ML SYRINGE
INTRAMUSCULAR | Status: DC | PRN
  Administered 2020-05-08: 80 mg via INTRAVENOUS

## 2020-05-08 MED ORDER — OXYCODONE-ACETAMINOPHEN 5-325 MG PO TABS
1.0000 | ORAL_TABLET | Freq: Three times a day (TID) | ORAL | 0 refills | Status: AC | PRN
Start: 2020-05-08 — End: 2021-05-08

## 2020-05-08 MED ORDER — SODIUM CHLORIDE 0.9 % IV SOLN
1.0000 g | INTRAVENOUS | Status: AC
  Administered 2020-05-08: 1 g via INTRAVENOUS

## 2020-05-08 MED ORDER — ONDANSETRON HCL 4 MG/2ML IJ SOLN
INTRAMUSCULAR | Status: AC
  Filled 2020-05-08: qty 2

## 2020-05-08 MED ORDER — FENTANYL CITRATE (PF) 100 MCG/2ML IJ SOLN
INTRAMUSCULAR | Status: AC
  Filled 2020-05-08: qty 2

## 2020-05-08 MED ORDER — LIDOCAINE 2% (20 MG/ML) 5 ML SYRINGE
INTRAMUSCULAR | Status: AC
  Filled 2020-05-08: qty 5

## 2020-05-08 MED ORDER — PROPOFOL 10 MG/ML IV BOLUS
INTRAVENOUS | Status: DC | PRN
  Administered 2020-05-08: 200 mg via INTRAVENOUS

## 2020-05-08 MED ORDER — CEFTRIAXONE SODIUM 1 G IJ SOLR
INTRAMUSCULAR | Status: AC
  Filled 2020-05-08: qty 10

## 2020-05-08 MED ORDER — STERILE WATER FOR IRRIGATION IR SOLN
Status: DC | PRN
  Administered 2020-05-08: 3000 mL

## 2020-05-08 MED ORDER — IOHEXOL 300 MG/ML  SOLN
INTRAMUSCULAR | Status: DC | PRN
  Administered 2020-05-08: 10 mL

## 2020-05-08 MED ORDER — ACETAMINOPHEN 500 MG PO TABS
1000.0000 mg | ORAL_TABLET | Freq: Once | ORAL | Status: AC
  Administered 2020-05-08: 1000 mg via ORAL

## 2020-05-08 MED ORDER — ARTIFICIAL TEARS OPHTHALMIC OINT
TOPICAL_OINTMENT | OPHTHALMIC | Status: AC
  Filled 2020-05-08: qty 3.5

## 2020-05-08 MED ORDER — PHENYLEPHRINE 40 MCG/ML (10ML) SYRINGE FOR IV PUSH (FOR BLOOD PRESSURE SUPPORT)
PREFILLED_SYRINGE | INTRAVENOUS | Status: DC | PRN
  Administered 2020-05-08: 80 ug via INTRAVENOUS
  Administered 2020-05-08: 160 ug via INTRAVENOUS

## 2020-05-08 MED ORDER — SODIUM CHLORIDE 0.9 % IV SOLN
INTRAVENOUS | Status: AC
  Filled 2020-05-08: qty 100

## 2020-05-08 MED ORDER — PROPOFOL 10 MG/ML IV BOLUS
INTRAVENOUS | Status: AC
  Filled 2020-05-08: qty 40

## 2020-05-08 MED ORDER — ONDANSETRON HCL 4 MG/2ML IJ SOLN
INTRAMUSCULAR | Status: DC | PRN
  Administered 2020-05-08: 4 mg via INTRAVENOUS

## 2020-05-08 MED ORDER — FENTANYL CITRATE (PF) 100 MCG/2ML IJ SOLN: 25.0000 ug | INTRAMUSCULAR | Status: DC | PRN

## 2020-05-08 SURGICAL SUPPLY — 29 items
BAG DRAIN URO-CYSTO SKYTR STRL (DRAIN) ×3 IMPLANT
BAG DRN UROCATH (DRAIN) ×1
BASKET LASER NITINOL 1.9FR (BASKET) IMPLANT
BSKT STON RTRVL 120 1.9FR (BASKET)
CATH INTERMIT  6FR 70CM (CATHETERS) ×3 IMPLANT
CLOTH BEACON ORANGE TIMEOUT ST (SAFETY) ×3 IMPLANT
FIBER LASER FLEXIVA 365 (UROLOGICAL SUPPLIES) IMPLANT
FIBER LASER TRAC TIP (UROLOGICAL SUPPLIES) IMPLANT
GLOVE BIO SURGEON STRL SZ7.5 (GLOVE) ×3 IMPLANT
GLOVE BIOGEL PI IND STRL 7.5 (GLOVE) ×2 IMPLANT
GLOVE BIOGEL PI INDICATOR 7.5 (GLOVE) ×4
GLOVE ECLIPSE 7.5 STRL STRAW (GLOVE) ×3 IMPLANT
GOWN STRL REUS W/TWL LRG LVL3 (GOWN DISPOSABLE) ×3 IMPLANT
GUIDEWIRE ANG ZIPWIRE 038X150 (WIRE) ×6 IMPLANT
GUIDEWIRE STR DUAL SENSOR (WIRE) IMPLANT
IV NS 1000ML (IV SOLUTION)
IV NS 1000ML BAXH (IV SOLUTION) IMPLANT
IV NS IRRIG 3000ML ARTHROMATIC (IV SOLUTION) IMPLANT
KIT TURNOVER CYSTO (KITS) ×3 IMPLANT
MANIFOLD NEPTUNE II (INSTRUMENTS) ×3 IMPLANT
NS IRRIG 500ML POUR BTL (IV SOLUTION) IMPLANT
PACK CYSTO (CUSTOM PROCEDURE TRAY) ×3 IMPLANT
STENT CONTOUR 7FRX26 (STENTS) ×6 IMPLANT
SYR 10ML LL (SYRINGE) ×3 IMPLANT
TUBE CONNECTING 12'X1/4 (SUCTIONS) ×1
TUBE CONNECTING 12X1/4 (SUCTIONS) ×2 IMPLANT
TUBE FEEDING 8FR 16IN STR KANG (MISCELLANEOUS) IMPLANT
TUBING UROLOGY SET (TUBING) IMPLANT
WATER STERILE IRR 3000ML UROMA (IV SOLUTION) ×3 IMPLANT

## 2020-05-08 NOTE — Transfer of Care (Signed)
Immediate Anesthesia Transfer of Care Note  Patient: DEANDREA RION  Procedure(s) Performed: Procedure(s) (LRB): CYSTOSCOPY WITH RETROGRADE PYELOGRAM/URETERAL STENT REPLACEMENT (Bilateral)  Patient Location: PACU  Anesthesia Type: General  Level of Consciousness: awake, alert  and oriented  Airway & Oxygen Therapy: Patient Spontanous Breathing and Patient connected to face mask oxygen  Post-op Assessment: Report given to PACU RN and Post -op Vital signs reviewed and stable  Post vital signs: Reviewed and stable  Complications: No apparent anesthesia complications  Last Vitals:  Vitals Value Taken Time  BP 121/69 05/08/20 0809  Temp 36.6 C 05/08/20 0809  Pulse 75 05/08/20 0809  Resp 19 05/08/20 0809  SpO2 100 % 05/08/20 0809  Vitals shown include unvalidated device data.  Last Pain:  Vitals:   05/08/20 0621  TempSrc: Oral  PainSc: 0-No pain      Patients Stated Pain Goal: 2 (37/34/28 7681)  Complications: No complications documented.

## 2020-05-08 NOTE — Discharge Instructions (Signed)
1 - You may have urinary urgency (bladder spasms) and bloody urine on / off with stent in place. This is normal.  2 - Call MD or go to ER for fever >102, severe pain / nausea / vomiting not relieved by medications, or acute change in medical status   Post Anesthesia Home Care Instructions  Activity: Get plenty of rest for the remainder of the day. A responsible individual must stay with you for 24 hours following the procedure.  For the next 24 hours, DO NOT: -Drive a car -Operate machinery -Drink alcoholic beverages -Take any medication unless instructed by your physician -Make any legal decisions or sign important papers.  Meals: Start with liquid foods such as gelatin or soup. Progress to regular foods as tolerated. Avoid greasy, spicy, heavy foods. If nausea and/or vomiting occur, drink only clear liquids until the nausea and/or vomiting subsides. Call your physician if vomiting continues.  Special Instructions/Symptoms: Your throat may feel dry or sore from the anesthesia or the breathing tube placed in your throat during surgery. If this causes discomfort, gargle with warm salt water. The discomfort should disappear within 24 hours.  [facility] [phone] Post Ureteroscopy With Stent Instructions  Definitions:  Ureter: The duct that transports urine from the kidney to the bladder. Stent:   A plastic hollow tube that is placed into the ureter, from the kidney to the bladder to prevent the ureter from swelling shut.  GENERAL INSTRUCTIONS:  Despite the fact that no skin incisions were used, the area around the ureter and bladder is raw and irritated. The stent is a foreign body which will further irritate the bladder wall. This irritation is manifested by increased frequency of urination, both day and night, and by an increase in the urge to urinate. In some, the urge to urinate is present almost always. Sometimes the urge is strong enough that you may not be  able to stop yourself from urinating. The only real cure is to remove the stent and then give time for the bladder wall to heal which can't be done until the danger of the ureter swelling shut has passed, which varies.  You may see some blood in your urine while the stent is in place and a few days afterwards. Do not be alarmed, even if the urine was clear for a while. Get off your feet and drink lots of fluids until clearing occurs. If you start to pass clots or don't improve, call us.  DIET: You may return to your normal diet immediately. Because of the raw surface of your bladder, alcohol, spicy foods, acid type foods and drinks with caffeine may cause irritation or frequency and should be used in moderation. To keep your urine flowing freely and to avoid constipation, drink plenty of fluids during the day ( 8-10 glasses ). Tip: Avoid cranberry juice because it is very acidic.  ACTIVITY: Your physical activity doesn't need to be restricted. However, if you are very active, you may see some blood in your urine. We suggest that you reduce your activity under these circumstances until the bleeding has stopped.  BOWELS: It is important to keep your bowels regular during the postoperative period. Straining with bowel movements can cause bleeding. A bowel movement every other day is reasonable. Use a mild laxative if needed, such as Milk of Magnesia 2-3 tablespoons, or 2 Dulcolax tablets. Call if you continue to have problems. If you have been taking narcotics for pain, before, during or after your surgery,   you may be constipated. Take a laxative if necessary.   MEDICATION: You should resume your pre-surgery medications unless told not to. In addition you will often be given an antibiotic to prevent infection. These should be taken as prescribed until the bottles are finished unless you are having an unusual reaction to one of the drugs.  PROBLEMS YOU SHOULD REPORT TO US: Fevers over 100.5  Fahrenheit. Heavy bleeding, or clots ( See above notes about blood in urine ). Inability to urinate. Drug reactions ( hives, rash, nausea, vomiting, diarrhea ). Severe burning or pain with urination that is not improving.  FOLLOW-UP: You will need a follow-up appointment to monitor your progress. Call for this appointment at the number listed above. Usually the first appointment will be about three to fourteen days after your surgery.        

## 2020-05-08 NOTE — Brief Op Note (Signed)
05/08/2020  7:57 AM  PATIENT:  Devon Cook  59 y.o. male  PRE-OPERATIVE DIAGNOSIS:  URETERAL OBSTRUCTION  POST-OPERATIVE DIAGNOSIS:  URETERAL OBSTRUCTION  PROCEDURE:  Procedure(s): CYSTOSCOPY WITH RETROGRADE PYELOGRAM/URETERAL STENT REPLACEMENT (Bilateral)  SURGEON:  Surgeon(s) and Role:    * Alexis Frock, MD - Primary  PHYSICIAN ASSISTANT:   ASSISTANTS: none   ANESTHESIA:   general  EBL:  minimal   BLOOD ADMINISTERED:none  DRAINS: none   LOCAL MEDICATIONS USED:  NONE  SPECIMEN:  No Specimen  DISPOSITION OF SPECIMEN:  N/A  COUNTS:  YES  TOURNIQUET:  * No tourniquets in log *  DICTATION: .Other Dictation: Dictation Number [id number]  PLAN OF CARE: Discharge to home after PACU  PATIENT DISPOSITION:  PACU - hemodynamically stable.   Delay start of Pharmacological VTE agent (>24hrs) due to surgical blood loss or risk of bleeding: yes

## 2020-05-08 NOTE — Anesthesia Procedure Notes (Signed)
Procedure Name: LMA Insertion Date/Time: 05/08/2020 7:37 AM Performed by: Mechele Claude, CRNA Pre-anesthesia Checklist: Patient identified, Emergency Drugs available, Suction available and Patient being monitored Patient Re-evaluated:Patient Re-evaluated prior to induction Oxygen Delivery Method: Circle system utilized Preoxygenation: Pre-oxygenation with 100% oxygen Induction Type: IV induction Ventilation: Mask ventilation without difficulty LMA: LMA inserted LMA Size: 5.0 Number of attempts: 1 Airway Equipment and Method: Bite block Placement Confirmation: positive ETCO2 Tube secured with: Tape Dental Injury: Teeth and Oropharynx as per pre-operative assessment

## 2020-05-08 NOTE — Anesthesia Postprocedure Evaluation (Signed)
Anesthesia Post Note  Patient: Devon Cook  Procedure(s) Performed: CYSTOSCOPY WITH RETROGRADE PYELOGRAM/URETERAL STENT REPLACEMENT (Bilateral Renal)     Patient location during evaluation: PACU Anesthesia Type: General Level of consciousness: awake and alert Pain management: pain level controlled Vital Signs Assessment: post-procedure vital signs reviewed and stable Respiratory status: spontaneous breathing, nonlabored ventilation, respiratory function stable and patient connected to nasal cannula oxygen Cardiovascular status: blood pressure returned to baseline and stable Postop Assessment: no apparent nausea or vomiting Anesthetic complications: no   No complications documented.  Last Vitals:  Vitals:   05/08/20 0845 05/08/20 0907  BP: (!) 145/90 (!) 145/108  Pulse: 72 70  Resp: 16 14  Temp:  36.5 C  SpO2: 95% 98%    Last Pain:  Vitals:   05/08/20 0907  TempSrc:   PainSc: 0-No pain                 Jilleen Essner L Tatijana Bierly

## 2020-05-08 NOTE — H&P (Signed)
Devon Cook is an 59 y.o. male.    Chief Complaint: Pre-Op BILATERAL Stent Exchange  HPI:   1 - Prostate Cancer-  07/2019 - PSA .056 / T 13 ==> Eligard 45; 09/2019 OR exchange bilateral stents (7x26, minimal encrustation); 02/2020 - PSA .038, T 12, CMP ok   2 - Malignant Ureteral Obstruction - Cr 17 by PCP labs on eval malaize 11/2017. Non-con CT with hydro to level of thickened bladder. Bilateral neph tubes placed 11/2017 and converted to bilateral 7x26 stents 12/2017 in OR. Now established with [place] kidney with GFR 15 range, requires aranesp and PRN transfusion.   3- End Stage Renal Disease - Cr 4-8's after bilateral renal decompression c/w permanent renal damage. Hemodialysis initiated 2020 with Dr. Lorrene Reid at [facility]. Has Left AVF that is matureing. May consider PD.     PMH sig for pre-DM, obesity, L spine arthritis. NO isschemic CV disease / blood thinners.     Today "Devon Cook" is seen to proceed with bialteral stent exchange. C19 screen negative. Has cards clearance.   Past Medical History:  Diagnosis Date  . Anemia, chronic renal failure    followed by dr Hollie Salk--- dx 05/ 2019---  treated with Iron infusions and procrit  . Bilateral hydronephrosis urologist-- dr Tresa Moore   malignant-- chronic;  treated with ureteral stents  . Chronic gastritis    07-17-2019 and duodenitis  . ED (erectile dysfunction)   . Edema of both lower extremities    08-30-2019  per pt edema has much improved since started hemodialysis  . End stage renal failure on dialysis [facility] hemodialysis T/TH/S @ [facility]---started first week of Nov 2020   [facility]rologist[facility] secondary to obstructive uropathy secondary to prostate cancer  . GERD (gastroesophageal reflux disease)   . Gout    03-20-2020 per pt unsure when last episode but it has been yrs  . Herpes genitalis in men   . History of adenomatous polyp of colon   . History of hepatitis C 2017   dx 11/ 207-- treated  with Harvoni (in epic 04/ 2019 lab result non-detectable)  . History of lower GI bleeding 11/2017  . Hypertension    followed by pcp  . Nasal congestion    03-20-2020 per pt started two day ago  . OSA (obstructive sleep apnea)    severe osa w/ nocturnal hyoxemia per study 06-19-2019,  pt given cpap ( however on 08-30-2019 per pt having issues with mask fitting and on a new one so he not using the cpap);   (03-20-2020 still not using cpap, no mask)  . Palpitations    isolated event at ED 10-05-2019 Aflutter/ Afib versus SVT ;   refer to pt's cardiologist note w/ Dr Johney Frame  . Prostate 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 [facility]) urologist-- dr Tresa Moore   dx 06/ 2019-- advanced w/ mets to bone and pelvic lymphadeopathy  . Runny nose    03-20-2020 per pt started two days ago  . S/P arteriovenous (AV) fistula creation followed by dr Carlis Abbott (vascular)   03-15-2019  by dr Carlis Abbott, left branhiocephallic  . Secondary hyperparathyroidism of renal origin ([place])   . Wears glasses     Past Surgical History:  Procedure Laterality Date  . AV FISTULA PLACEMENT Left 03/15/2019   Procedure: ARTERIOVENOUS (AV) FISTULA CREATION LEFT ARM;  Surgeon: Marty Heck, MD;  Location: [place];  Service: Vascular;  Laterality: Left;  . COLONOSCOPY    . CYSTOSCOPY W/ URETERAL STENT  PLACEMENT Bilateral 12/08/2017   Procedure: CYSTOSCOPY WITH RETROGRADE PYELOGRAM/URETERAL STENT PLACEMENT;  Surgeon: Alexis Frock, MD;  Location: [facility];  Service: Urology;  Laterality: Bilateral;  . CYSTOSCOPY W/ URETERAL STENT PLACEMENT Bilateral 04/20/2018   Procedure: CYSTOSCOPY WITH STENT REPLACEMENT;  Surgeon: Alexis Frock, MD;  Location: [facility];  Service: Urology;  Laterality: Bilateral;  . CYSTOSCOPY W/ URETERAL STENT PLACEMENT Bilateral 08/10/2018   Procedure: CYSTOSCOPY WITH STENT REPLACEMENT, BILATERAL RETROGRADES;  Surgeon: Alexis Frock, MD;  Location: [facility];  Service: Urology;   Laterality: Bilateral;  45 MINS  . CYSTOSCOPY W/ URETERAL STENT PLACEMENT Bilateral 01/18/2019   Procedure: CYSTOSCOPY WITH RETROGRADE PYELOGRAM/URETERAL STENT PLACEMENT;  Surgeon: Alexis Frock, MD;  Location: [facility];  Service: Urology;  Laterality: Bilateral;  . CYSTOSCOPY W/ URETERAL STENT PLACEMENT Bilateral 05/24/2019   Procedure: CYSTOSCOPY WITH RETROGRADE PYELOGRAM/BILATERAL URETERAL STENT EXCHANGE;  Surgeon: Alexis Frock, MD;  Location: [facility];  Service: Urology;  Laterality: Bilateral;  . CYSTOSCOPY W/ URETERAL STENT PLACEMENT Bilateral 09/06/2019   Procedure: CYSTOSCOPY WITH RETROGRADE PYELOGRAM/URETERAL STENT EXCHANGE;  Surgeon: Alexis Frock, MD;  Location: [facility];  Service: Urology;  Laterality: Bilateral;  45 MINS  . INSERTION OF DIALYSIS CATHETER Right 05/06/2019   Procedure: INSERTION OF TUNNELED  DIALYSIS CATHETER;  Surgeon: Rosetta Posner, MD;  Location: [place];  Service: Vascular;  Laterality: Right;  . IR NEPHROSTOMY PLACEMENT LEFT  11/15/2017  . IR NEPHROSTOMY PLACEMENT RIGHT  11/15/2017  . PROSTATE BIOPSY N/A 12/08/2017   Procedure: BIOPSY TRANSRECTAL ULTRASONIC PROSTATE (TUBP), PERINEAL BIOPSY;  Surgeon: Alexis Frock, MD;  Location: [facility];  Service: Urology;  Laterality: N/A;  . TRANSURETHRAL RESECTION OF BLADDER TUMOR N/A 12/08/2017   Procedure: TRANSURETHRAL RESECTION OF BLADDER TUMOR (TURBT);  Surgeon: Alexis Frock, MD;  Location: [facility];  Service: Urology;  Laterality: N/A;  . TRICEPS TENDON REPAIR Bilateral 08/2005  . UPPER GASTROINTESTINAL ENDOSCOPY      Family History  Problem Relation Age of Onset  . Hypertension Mother   . Heart disease Mother   . Diabetes Sister   . Diabetes Brother   . Prostate cancer Maternal Grandfather   . Diabetes Sister   . Prostate cancer Maternal Uncle   . Colon polyps Neg Hx   . Esophageal cancer Neg Hx   . Stomach cancer Neg Hx   . Rectal cancer Neg Hx    Social  History:  reports that he has been smoking cigarettes, pipe, and cigars. He has a 3.00 pack-year smoking history. He has never used smokeless tobacco. He reports current alcohol use. He reports that he does not use drugs.  Allergies: No Known Allergies  Medications Prior to Admission  Medication Sig Dispense Refill  . albuterol (VENTOLIN HFA) 108 (90 Base) MCG/ACT inhaler TAKE 2 PUFFS BY MOUTH EVERY 6 HOURS AS NEEDED FOR WHEEZE OR SHORTNESS OF BREATH 18 g 0  . allopurinol (ZYLOPRIM) 100 MG tablet TAKE 1 TABLET BY MOUTH ONCE DAILY 30 tablet 0  . amLODipine (NORVASC) 10 MG tablet Take 10 mg by mouth at bedtime.     Marland Kitchen aspirin EC 81 MG tablet Take 81 mg by mouth daily.    . cinacalcet (SENSIPAR) 60 MG tablet Take 60 mg by mouth daily.     . diphenhydrAMINE (BENADRYL) 25 MG tablet Take 25 mg by mouth every 6 (six) hours as needed.    . ferric citrate (AURYXIA) 1 GM 210 MG(Fe) tablet Take 420 mg by mouth  3 (three) times daily with meals.     . methocarbamol (ROBAXIN) 750 MG tablet Take 750 mg by mouth at bedtime as needed for muscle spasms.    . metoprolol succinate (TOPROL XL) 25 MG 24 hr tablet Take 1 tablet (25 mg total) by mouth daily. (Patient taking differently: Take 25 mg by mouth at bedtime. ) 90 tablet 3  . Multiple Vitamins-Minerals (MULTIVITAMIN WITH MINERALS) tablet Take 1 tablet by mouth daily.      Marland Kitchen oxyCODONE-acetaminophen (PERCOCET) 5-325 MG tablet Take 1 tablet by mouth every 8 (eight) hours as needed for moderate pain. From metastatic cancer and post-operatively. 40 tablet 0  . pantoprazole (PROTONIX) 40 MG tablet Take 1 tablet (40 mg total) by mouth 2 (two) times daily. 112 tablet 0  . ALPRAZolam (XANAX) 0.25 MG tablet Take 1 tablet (0.25 mg total) by mouth daily as needed for anxiety. 30 tablet 1  . lidocaine-prilocaine (EMLA) cream Apply 1 application topically as needed. 30 g 5  . Methoxy PEG-Epoetin Beta (MIRCERA IJ) Mircera    . Oxymetazoline HCl (VICKS SINEX NA) Place 1 spray  into the nose daily as needed (congestion).      No results found for this or any previous visit (from the past 48 hour(s)). No results found.  Review of Systems  Constitutional: Negative for chills and fever.  All other systems reviewed and are negative.   Blood pressure (!) 144/84, pulse 79, temperature 99.2 F (37.3 C), temperature source Oral, resp. rate 18, height 6\' 2"  (1.88 m), weight 134.8 kg, SpO2 97 %. Physical Exam Vitals reviewed.  Constitutional:      Comments: Very pleasant, at baseline.   HENT:     Head: Normocephalic.     Nose: Nose normal.  Eyes:     Pupils: Pupils are equal, round, and reactive to light.  Cardiovascular:     Rate and Rhythm: Normal rate.     Pulses: Normal pulses.  Pulmonary:     Effort: Pulmonary effort is normal.  Abdominal:     Comments: Stable large truncal obesity.   Genitourinary:    Comments: No CVAT at present.  Musculoskeletal:        General: Normal range of motion.     Cervical back: Normal range of motion.  Skin:    General: Skin is warm.  Neurological:     General: No focal deficit present.     Mental Status: He is alert.  Psychiatric:        Mood and Affect: Mood normal.      Assessment/Plan  Proceed with cysto, bilateral retrogrades, bilateral ureteral stent exchange. Risks, benefits, alternatives, expected peri-op course discussed previously (many times) and reiterated today.   Alexis Frock, MD 05/08/2020, 6:38 AM

## 2020-05-08 NOTE — Op Note (Signed)
NAME: Devon Cook, RAFFO MEDICAL RECORD LZ:[id number] ACCOUNT [id number] DATE OF BIRTH:July 07, 1960 FACILITY: [facility] LOCATION: [facility] PHYSICIAN:Sahmir Weatherbee Tresa Moore, MD  OPERATIVE REPORT  DATE OF PROCEDURE:  05/08/2020  PREOPERATIVE DIAGNOSIS:  Metastatic locally advanced prostate cancer with malignant hydronephrosis.  PROCEDURE: 1.  Cystoscopy, bilateral retrograde pyelograms, interpretation. 2.  Exchange of bilateral ureteral stents, 7 x 26 Contour, no tether.  ESTIMATED BLOOD LOSS:  Nil.  COMPLICATIONS:  None.  SPECIMEN:  Bilateral stents for discard.  FINDINGS: 1.  Stable bladder neck area, prostate cancer tissue.  No interval progression. 2.  Resolution of prior hydronephrosis with stenting. 3.  Successful exchange of bilateral ureteral stents proximal renal pelvis, distal end in urinary bladder.  INDICATIONS:  The patient is a very pleasant, but unfortunate 59 year old African-American male with history of metastatic prostate cancer.  He has had malignant obstruction, since his diagnosis approximately 2 years ago, he has been managed with chronic  stenting and is very compliant with this.  He has progressed to end-stage renal disease.  However, he still makes significant volume of urine and is still stent dependent.  His last exchange was approximately 10 months ago and he is clearly due for  stent exchange.  He presents for this today.  Informed consent was obtained and placed in medical record.  DESCRIPTION OF PROCEDURE:  The patient being identified, the procedure being bilateral ureteral stent exchange was confirmed.  Procedure timeout was performed.  Intravenous antibiotics were administered.  General LMA anesthesia induced.  The patient was  placed into a low lithotomy position, sterile field was created prepped and draped base of the penis, perineum and proximal thighs using iodine.  Cystourethroscopy was performed using 21-French cystoscope with offset lens.  Inspection of  anterior and  posterior urethra was unremarkable.  Inspection of the urinary bladder and bladder neck area did reveal some smooth nodular tissue in the bladder neck area consistent with known locally advanced prostate cancer.  There is essentially no progression since  last visualization.  Distal end of bilateral stents were in situ.  They are minimally encrusted.  Distal end of the right stent was grasped, brought to the level of the urethral meatus and a 0.038 ZIPwire was advanced to lower pole, exchanged for  open-ended catheter and right retrograde pyelogram was obtained.  Right retrograde pyelogram demonstrated a single right ureter with single system right kidney.  There was no significant hydronephrosis noted.  A ZIPwire was once again advanced and a new 7 x 26 Contour-type stent was placed using fluoroscopic guidance.   Good proximal and distal planes were noted.  Similarly, the distal end of the left stent was grasped, brought to the level of the urethral meatus and a 0.038 ZIPwire was advanced to lower pole exchanged for open-ended catheter and left retrograde  pyelogram was obtained.  Left retrograde pyelogram demonstrated a single left ureter with somewhat bifid system left kidney.  A ZIPwire was advanced to the level of the upper pole and a new 7 x 26 Contour-type stent was placed using fluoroscopic guidance.  Good proximal and  distal plane were noted.  Bladder was inspected per cystoscope.  Procedure was then terminated.  The patient tolerated the procedure well.  No immediate complications.  The patient was taken to postanesthesia care unit in stable condition.  Plan for  discharge home.  HN/NUANCE  D:05/08/2020 T:05/08/2020 JOB:[id number]

## 2020-05-11 ENCOUNTER — Encounter (HOSPITAL_BASED_OUTPATIENT_CLINIC_OR_DEPARTMENT_OTHER): Payer: Self-pay | Admitting: Urology

## 2020-05-14 ENCOUNTER — Other Ambulatory Visit: Payer: Self-pay

## 2020-05-14 ENCOUNTER — Telehealth (INDEPENDENT_AMBULATORY_CARE_PROVIDER_SITE_OTHER): Payer: 59 | Admitting: Family Medicine

## 2020-05-14 ENCOUNTER — Encounter: Payer: Self-pay | Admitting: Family Medicine

## 2020-05-14 VITALS — BP 136/84 | HR 91 | Temp 98.6°F | Wt 297.0 lb

## 2020-05-14 DIAGNOSIS — J0140 Acute pansinusitis, unspecified: Secondary | ICD-10-CM | POA: Diagnosis not present

## 2020-05-14 DIAGNOSIS — Z992 Dependence on renal dialysis: Secondary | ICD-10-CM

## 2020-05-14 DIAGNOSIS — R058 Other specified cough: Secondary | ICD-10-CM

## 2020-05-14 DIAGNOSIS — N186 End stage renal disease: Secondary | ICD-10-CM

## 2020-05-14 DIAGNOSIS — R062 Wheezing: Secondary | ICD-10-CM

## 2020-05-14 MED ORDER — AMOXICILLIN-POT CLAVULANATE 500-125 MG PO TABS: 1.0000 | ORAL_TABLET | Freq: Every day | ORAL | 0 refills | Status: DC

## 2020-05-14 NOTE — Progress Notes (Signed)
   Subjective:  Documentation for virtual audio and video telecommunications through [facility] encounter:  The patient was located at home. 2 patient identifiers used.  The provider was located in the office. The patient did consent to this visit and is aware of possible charges through their insurance for this visit.  The other persons participating in this telemedicine service were none. Time spent on call was 15 minutes and in review of previous records 18 minutes total.  This virtual service is not related to other E/M service within previous 7 days.    Patient ID: Devon Cook, male    DOB: 08-30-1960, 59 y.o.   MRN: [id number]  HPI Chief Complaint  Patient presents with  . sinus infection    sneezing, drainage, phelgm,started oevr the weekend, had surgery friday and had to covid test then   Complains of a 4-5 day history of bilateral earaches, eye drainage, purulent nasal drainage, sinus pain, PND, productive cough, wheezing.   No fever, chills, body aches, dizziness, headache, chest pain, palpitations, shortness of breath, abdominal pain, N/V.   States he is taking guaifenesin and Afrin at home. Using albuterol inhaler. He was prescribed this for previously for an illness.   Negative Covid test last week prior to surgery.   Last sinusitis in September and treated with Amoxil.   He has ESRD on dialysis. Dialysis schedule is T, TH, Sat.    Review of Systems Pertinent positives and negatives in the history of present illness.     Objective:   Physical Exam BP 136/84   Pulse 91   Temp 98.6 F (37 C)   Wt 297 lb (134.7 kg)   BMI 38.13 kg/m   Alert and oriented and in no acute distress. Respirations unlabored. Speaking in complete sentences without difficulty.      Assessment & Plan:  Acute pansinusitis, recurrence not specified - Plan: amoxicillin-clavulanate (AUGMENTIN) 500-125 MG tablet  Productive cough  Wheezing  ESRD on dialysis ([place])  I will  treat him for sinusitis but will prescribe hemodialysis dosing for Augmentin per [facility]. Counseling on symptomatic treatment. He may use the albuterol inhaler he currently has at home. Recommend stopping Afrin and switch to a steroid nasal spray. Follow-up if worsening or not back to baseline after completing the antibiotic

## 2020-05-26 ENCOUNTER — Encounter: Payer: Self-pay | Admitting: Family Medicine

## 2020-05-26 ENCOUNTER — Other Ambulatory Visit: Payer: Self-pay

## 2020-05-26 ENCOUNTER — Telehealth (INDEPENDENT_AMBULATORY_CARE_PROVIDER_SITE_OTHER): Payer: 59 | Admitting: Family Medicine

## 2020-05-26 VITALS — BP 136/82 | Temp 98.7°F | Wt 297.0 lb

## 2020-05-26 DIAGNOSIS — R062 Wheezing: Secondary | ICD-10-CM

## 2020-05-26 DIAGNOSIS — J301 Allergic rhinitis due to pollen: Secondary | ICD-10-CM

## 2020-05-26 DIAGNOSIS — J0140 Acute pansinusitis, unspecified: Secondary | ICD-10-CM

## 2020-05-26 MED ORDER — AMOXICILLIN-POT CLAVULANATE 500-125 MG PO TABS: 1.0000 | ORAL_TABLET | Freq: Every day | ORAL | 0 refills | Status: DC

## 2020-05-26 NOTE — Progress Notes (Signed)
   Subjective:    Patient ID: Devon Cook, male    DOB: 1960-09-21, 59 y.o.   MRN: [id number]  HPI I connected with  Devon Cook on 05/26/20 by a video enabled telemedicine application and verified that I am speaking with the correct person using two identifiers.  caregility used.  I am in my office, he is at home. I discussed the limitations of evaluation and management by telemedicine. The patient expressed understanding and agreed to proceed. He continues have difficulty with rhinorrhea, ear congestion, sneezing, coughing with wheezing.  Presently he is not taking an antihistamine.  He is using the inhaler roughly twice per day.  He states that the Augmentin made him roughly 50% better.  Review of Systems     Objective:   Physical Exam Alert and in no distress.  His voice and breathing pattern appears normal.       Assessment & Plan:  Acute pansinusitis, recurrence not specified - Plan: amoxicillin-clavulanate (AUGMENTIN) 500-125 MG tablet  Wheezing  Seasonal allergic rhinitis due to pollen I will place him back on Augmentin.  Have him use either Allegra or Claritin to help with his allergy type symptoms.  Continue on the inhaler.  Explained that I would like him at least 90% better.  He will call me when he finishes the antibiotic.

## 2020-05-27 ENCOUNTER — Other Ambulatory Visit: Payer: Self-pay

## 2020-05-27 ENCOUNTER — Telehealth: Payer: Self-pay

## 2020-05-27 NOTE — Telephone Encounter (Signed)
Spoke with pt to r/s LUA fistulogram as requested from dialysis center. Pt requested to wait until end of January to schedule d/t he's recovering from another procedure. Pt scheduled on 07/31/20 with Dr. Oneida Alar. Instructions reviewed and letter mailed patient.

## 2020-07-03 NOTE — Progress Notes (Deleted)
Cardiology Office Note:    Date:  07/03/2020   ID:  Devon Cook, DOB [birth year], MRN [id number]  PCP:  [name], MD  [facility] Cardiologist:  No primary care provider on file.  [facility] Electrophysiologist:  None   Referring MD: [name], MD    History of Present Illness:    Devon Cook is a 59 y.o. male with a hx of ESRD on HD, metastatic prostate cancer, HTN, HLD, OSA on CPAP, and Afib who presents to clinic for follow-up.  Patient presented for pre-operative clearance for ureteral stent placement during our last visit on 04/08/20. He was able to perform >4METs during that visit. TTE with LVEF 45-50% with global hypokinesis, G1DD, moderate LAE, mild RAE, ascending aortic dilation to 51mm. He was cleared for his procedure which took place on 05/08/20 which he tolerated well.  He now presents to clinic for follow-up.  Past Medical History:  Diagnosis Date  . Anemia, chronic renal failure    followed by dr Hollie Salk--- dx 05/ 2019---  treated with Iron infusions and procrit  . Bilateral hydronephrosis urologist-- dr Tresa Moore   malignant-- chronic;  treated with ureteral stents  . Chronic gastritis    07-17-2019 and duodenitis  . ED (erectile dysfunction)   . Edema of both lower extremities    08-30-2019  per pt edema has much improved since started hemodialysis  . 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 GERD (gastroesophageal reflux disease)   . Gout    03-20-2020 per pt unsure when last episode but it has been yrs  . Herpes genitalis in men   . History of adenomatous polyp of colon   . History of hepatitis C 2017   dx 11/ 207-- treated with Harvoni (in epic 04/ 2019 lab result non-detectable)  . History of lower GI bleeding 11/2017  . Hypertension    followed by  pcp  . Nasal congestion    03-20-2020 per pt started two day ago  . OSA (obstructive sleep apnea)    severe osa w/ nocturnal hyoxemia per study 06-19-2019,  pt given cpap ( however on 08-30-2019 per pt having issues with mask fitting and on a new one so he not using the cpap);   (03-20-2020 still not using cpap, no mask)  . Palpitations    isolated event at ED 10-05-2019 Aflutter/ Afib versus SVT ;   refer to pt's cardiologist note w/ Dr Johney Frame  . Prostate 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 [facility]) urologist-- dr Tresa Moore   dx 06/ 2019-- advanced w/ mets to bone and pelvic lymphadeopathy  . Runny nose    03-20-2020 per pt started two days ago  . S/P arteriovenous (AV) fistula creation followed by dr Carlis Abbott (vascular)   03-15-2019  by dr Carlis Abbott, left branhiocephallic  . Secondary hyperparathyroidism of renal origin ([place])   . Wears glasses     Past Surgical History:  Procedure Laterality Date  . AV FISTULA PLACEMENT Left 03/15/2019   Procedure: ARTERIOVENOUS (AV) FISTULA CREATION LEFT ARM;  Surgeon: Marty Heck, MD;  Location: [place];  Service: Vascular;  Laterality: Left;  . COLONOSCOPY    . CYSTOSCOPY W/ URETERAL STENT PLACEMENT Bilateral 12/08/2017   Procedure: CYSTOSCOPY WITH RETROGRADE PYELOGRAM/URETERAL STENT PLACEMENT;  Surgeon: Alexis Frock, MD;  Location: [facility];  Service: Urology;  Laterality: Bilateral;  . CYSTOSCOPY W/ URETERAL STENT PLACEMENT Bilateral 04/20/2018   Procedure: CYSTOSCOPY WITH STENT REPLACEMENT;  Surgeon: Alexis Frock, MD;  Location: [facility];  Service: Urology;  Laterality: Bilateral;  . CYSTOSCOPY W/ URETERAL STENT PLACEMENT Bilateral 08/10/2018   Procedure: CYSTOSCOPY WITH STENT REPLACEMENT, BILATERAL RETROGRADES;  Surgeon: Alexis Frock, MD;  Location: [facility];  Service: Urology;  Laterality: Bilateral;  45 MINS  . CYSTOSCOPY W/ URETERAL STENT PLACEMENT Bilateral 01/18/2019   Procedure: CYSTOSCOPY WITH RETROGRADE  PYELOGRAM/URETERAL STENT PLACEMENT;  Surgeon: Alexis Frock, MD;  Location: [facility];  Service: Urology;  Laterality: Bilateral;  . CYSTOSCOPY W/ URETERAL STENT PLACEMENT Bilateral 05/24/2019   Procedure: CYSTOSCOPY WITH RETROGRADE PYELOGRAM/BILATERAL URETERAL STENT EXCHANGE;  Surgeon: Alexis Frock, MD;  Location: [facility];  Service: Urology;  Laterality: Bilateral;  . CYSTOSCOPY W/ URETERAL STENT PLACEMENT Bilateral 09/06/2019   Procedure: CYSTOSCOPY WITH RETROGRADE PYELOGRAM/URETERAL STENT EXCHANGE;  Surgeon: Alexis Frock, MD;  Location[facility];  Service: Urology;  Laterality: Bilateral;  45 MINS  . CYSTOSCOPY W/ URETERAL STENT PLACEMENT Bilateral 05/08/2020   Procedure: CYSTOSCOPY WITH RETROGRADE PYELOGRAM/URETERAL STENT REPLACEMENT;  Surgeon: Alexis Frock, MD;  Location: [facility];  Service: Urology;  Laterality: Bilateral;  . INSERTION OF DIALYSIS CATHETER Right 05/06/2019   Procedure: INSERTION OF TUNNELED  DIALYSIS CATHETER;  Surgeon: Rosetta Posner, MD;  Location: [place];  Service: Vascular;  Laterality: Right;  . IR NEPHROSTOMY PLACEMENT LEFT  11/15/2017  . IR NEPHROSTOMY PLACEMENT RIGHT  11/15/2017  . PROSTATE BIOPSY N/A 12/08/2017   Procedure: BIOPSY TRANSRECTAL ULTRASONIC PROSTATE (TUBP), PERINEAL BIOPSY;  Surgeon: Alexis Frock, MD;  Location: [facility];  Service: Urology;  Laterality: N/A;  . TRANSURETHRAL RESECTION OF BLADDER TUMOR N/A 12/08/2017   Procedure: TRANSURETHRAL RESECTION OF BLADDER TUMOR (TURBT);  Surgeon: Alexis Frock, MD;  Location: [facility];  Service: Urology;  Laterality: N/A;  . TRICEPS TENDON REPAIR Bilateral 08/2005  . UPPER GASTROINTESTINAL ENDOSCOPY      Current Medications: No outpatient medications have been marked as taking for the 07/14/20 encounter (Appointment) with Freada Bergeron, MD.     Allergies:   Patient has no known allergies.   Social History   Socioeconomic  History  . Marital status: Married    Spouse name: Not on file  . Number of children: 5  . Years of education: Not on file  . Highest education level: Not on file  Occupational History  . Occupation: retired  Tobacco Use  . Smoking status: Current Some Day Smoker    Packs/day: 0.50    Years: 6.00    Pack years: 3.00    Types: Cigarettes, Pipe, Cigars    Last attempt to quit: 07/28/2019    Years since quitting: 0.9  . Smokeless tobacco: Never Used  . Tobacco comment: 03-20-2020  per pt 2 cigars per week;  quit cigarettes 07-28-2019  Vaping Use  . Vaping Use: Never used  Substance and Sexual Activity  . Alcohol use: Yes    Comment: 2 drinks per weekend  . Drug use: No  . Sexual activity: Yes  Other Topics Concern  . Not on file  Social History Narrative  . Not on file   Social Determinants of Health   Financial Resource Strain: Not on file  Food Insecurity: Not on file  Transportation Needs: Not on file  Physical Activity: Not on file  Stress: Not on file  Social Connections: Not on file  Family History: The patient's ***family history includes Diabetes in his brother, sister, and sister; Heart disease in his mother; Hypertension in his mother; Prostate cancer in his maternal grandfather and maternal uncle. There is no history of Colon polyps, Esophageal cancer, Stomach cancer, or Rectal cancer.  ROS:   Please see the history of present illness.    *** All other systems reviewed and are negative.  EKGs/Labs/Other Studies Reviewed:    The following studies were reviewed today: TTE 07-May-2020: IMPRESSIONS  1. Left ventricular ejection fraction, by estimation, is 45 to 50%. The  left ventricle has mildly decreased function. The left ventricle  demonstrates global hypokinesis. The left ventricular internal cavity size  was moderately dilated. There is mild  concentric left ventricular hypertrophy. Left ventricular diastolic  parameters are consistent with Grade I  diastolic dysfunction (impaired  relaxation). Elevated left atrial pressure.  2. Right ventricular systolic function is normal. The right ventricular  size is normal. There is moderately elevated pulmonary artery systolic  pressure. The estimated right ventricular systolic pressure is 76.1 mmHg.  3. Left atrial size was moderately dilated.  4. Right atrial size was mildly dilated.  5. The mitral valve is normal in structure. Mild mitral valve  regurgitation. No evidence of mitral stenosis.  6. The aortic valve is normal in structure. Aortic valve regurgitation is  not visualized. No aortic stenosis is present.  7. Aortic dilatation noted. There is mild dilatation of the ascending  aorta, measuring 42 mm.  8. The inferior vena cava is dilated in size with <50% respiratory  variability, suggesting right atrial pressure of 15 mmHg.  CT abdomen/pelvis 11/2017: IMPRESSION: 1. Bilateral hydronephrosis, moderate to severe in degree, most likely caused by the irregular thickening seen along the posterior bladder walls with presumed obstruction at the bilateral UVJs. This is highly suspicious for neoplastic bladder wall thickening. Recommend urology consultation for possible cystoscopy. Percutaneous nephrostomy (or nephrostomies) may also be needed. 2. Conglomerate lymphadenopathy throughout the retroperitoneum and bilateral iliac chain regions, metastatic lymphadenopathy versus lymphoma. Consider tissue sampling. 3. Single sclerotic focus at the anterior aspects of the L3 vertebral body. This is most likely a benign bone island given the single site, however, early osseous metastasis cannot be excluded. Would consider nonemergent MRI or nuclear medicine bone scan for further characterization.  Sleep study 06/2019: IMPRESSIONS  - Severe obstructive sleep apnea occurred during this study (AHI  = 99.8/h).  - No significant central sleep apnea occurred during this study  (CAI =  0.0/h).  - Oxygen desaturation was noted during this study (Min O2 = 64%).  Mean sat 92%.  - Time with O2 saturation 89% or less was 79.9 minutes.  - Patient snored.   LE Ultrasound 11/2017: Right: There is no evidence of superficial venous thrombosis.There is no  evidence of deep vein thrombosis in the lower extremity. However, portions  of this examination were limited- see technologist comments above. No  cystic structure found in the  popliteal fossa.  Left: There is no evidence of deep vein thrombosis in the lower extremity.  However, portions of this examination were limited- see technologist  comments above.There is no evidence of superficial venous thrombosis. No  cystic structure found in the  popliteal fossa.   EKG:  EKG is *** ordered today.  The ekg ordered today demonstrates ***  Recent Labs: 10/05/2019: ALT 21; B Natriuretic Peptide 41.3; Magnesium 2.0; Platelets 331; TSH 2.732 05/08/2020: BUN 38; Creatinine, Ser 6.50; Hemoglobin 10.9; Potassium 3.9; Sodium 140  Recent Lipid Panel    Component Value Date/Time   CHOL 98 (L) 11/13/2017 0959   TRIG 72 11/13/2017 0959   HDL 32 (L) 11/13/2017 0959   CHOLHDL 3.1 11/13/2017 0959   CHOLHDL 2.8 09/07/2015 0001   VLDL 19 09/07/2015 0001   LDLCALC 52 11/13/2017 0959     Risk Assessment/Calculations:   {Does this patient have ATRIAL FIBRILLATION?:[id number]}   Physical Exam:    VS:  There were no vitals taken for this visit.    Wt Readings from Last 3 Encounters:  05/26/20 297 lb (134.7 kg)  05/14/20 297 lb (134.7 kg)  05/08/20 297 lb 3.2 oz (134.8 kg)     GEN: *** Well nourished, well developed in no acute distress HEENT: Normal NECK: No JVD; No carotid bruits LYMPHATICS: No lymphadenopathy CARDIAC: ***RRR, no murmurs, rubs, gallops RESPIRATORY:  Clear to auscultation without rales, wheezing or rhonchi  ABDOMEN: Soft, non-tender, non-distended MUSCULOSKELETAL:  No edema; No deformity  SKIN: Warm and  dry NEUROLOGIC:  Alert and oriented x 3 PSYCHIATRIC:  Normal affect   ASSESSMENT:    No diagnosis found. PLAN:    In order of problems listed above:  #Chronic Combined Systolic and Diastolic Heart Failure: LVEF 45-50% with global hypokinesis, G1DD. Currently euvolemic on examination with NYHA class II symptoms.  -Volume management with HD -Continue metoprolol -No ACE/ARB/entresto/spiro due to ESRD -If needed, can add hydralazine/isordil for further afterload reduction  #Palpitations: #Suspected Aflutter: Isolated event per ED report with no further issues. CHADs-vasc 1 for hypertension although ESRD does increase risk and may merit anticoagulation if any further episodes.  -Check TTE -Start metop 25mg  XL -Continue ASA -TSH 0.83 (normal) -If recurs, will have a discussion for risk vs benefits of anticoagulation at that time; would not need bridging for any operation given low CHADs-vasc -May consider ziopatch in future to determine if any asymptomatic episodes  #Mild-to-moderate ascending aortic dilation: Ascending aorta measures 81mm on TTE.  -Needs serial yearly surveillance with either CT or TTE -Aggressive blood pressure control  #HTN: Elevated BP 140/90 in the office today. Monitored closely at HD. -Will monitor at home x 5days and send in results -Likely needs some BP room in order to tolerate HD -Continue amlodipine 10mg   -Start metoprolol 25mg  XL  #ESRD on HD: #Metastatic prostate cancer Secondary to obstructive uropathy in the setting of metastatic prostate cancer. -Continue HD as scheduled -Follow-up with urology as scheduled   {Are you ordering a CV Procedure (e.g. stress test, cath, DCCV, TEE, etc)?   Press F2        :[id number]}    Medication Adjustments/Labs and Tests Ordered: Current medicines are reviewed at length with the patient today.  Concerns regarding medicines are outlined above.  No orders of the defined types were placed in this  encounter.  No orders of the defined types were placed in this encounter.   There are no Patient Instructions on file for this visit.   Signed, Freada Bergeron, MD  07/03/2020 4:32 PM    [facility]

## 2020-07-04 DIAGNOSIS — Z8616 Personal history of COVID-19: Secondary | ICD-10-CM

## 2020-07-04 HISTORY — DX: Personal history of COVID-19: Z86.16

## 2020-07-14 ENCOUNTER — Ambulatory Visit: Payer: 59 | Admitting: Cardiology

## 2020-07-28 ENCOUNTER — Telehealth (INDEPENDENT_AMBULATORY_CARE_PROVIDER_SITE_OTHER): Payer: 59 | Admitting: Family Medicine

## 2020-07-28 ENCOUNTER — Other Ambulatory Visit: Payer: Self-pay

## 2020-07-28 ENCOUNTER — Encounter: Payer: Self-pay | Admitting: Family Medicine

## 2020-07-28 VITALS — Temp 97.0°F | Wt 297.0 lb

## 2020-07-28 DIAGNOSIS — N186 End stage renal disease: Secondary | ICD-10-CM | POA: Diagnosis not present

## 2020-07-28 DIAGNOSIS — U071 COVID-19: Secondary | ICD-10-CM | POA: Diagnosis not present

## 2020-07-28 DIAGNOSIS — Z992 Dependence on renal dialysis: Secondary | ICD-10-CM

## 2020-07-28 NOTE — Progress Notes (Deleted)
Cardiology Office Note:    Date:  07/28/2020   ID:  Devon OCALLAGHAN, DOB 30-Apr-1961, MRN [id number]  PCP:  [name], MD  [facility] Cardiologist:  No primary care provider on file.  [facility] Electrophysiologist:  None   Referring MD: [name], MD    History of Present Illness:    Devon Cook is a 60 y.o. male with a hx of ESRD on HD, metastatic prostate cancer, HTN, HLD, OSA on CPAP, and Afib who presents to clinic for follow-up.  Patient presented for pre-operative clearance for ureteral stent placement during our last visit on 04/08/20. He was able to perform >4METs during that visit. TTE with LVEF 45-50% with global hypokinesis, G1DD, moderate LAE, mild RAE, ascending aortic dilation to 10mm. He was cleared for his procedure which took place on 05/08/20 which he tolerated well.  He now presents to clinic for follow-up.  Past Medical History:  Diagnosis Date  . Anemia, chronic renal failure    followed by dr Hollie Salk--- dx 05/ 2019---  treated with Iron infusions and procrit  . Bilateral hydronephrosis urologist-- dr Tresa Moore   malignant-- chronic;  treated with ureteral stents  . Chronic gastritis    07-17-2019 and duodenitis  . ED (erectile dysfunction)   . Edema of both lower extremities    08-30-2019  per pt edema has much improved since started hemodialysis  . End stage renal failure on dialysis [facility]) hemodialysis T/TH/S @ [facility]---started first week of Nov 2020   [facility]rolog[facility] secondary to obstructive uropathy secondary to prostate cancer  . GERD (gastroesophageal reflux disease)   . Gout    03-20-2020 per pt unsure when last episode but it has been yrs  . Herpes genitalis in men   . History of adenomatous polyp of colon   . History of hepatitis C 2017   dx 11/ 207-- treated with Harvoni (in epic 04/ 2019 lab result non-detectable)  . History of lower GI bleeding 11/2017  . Hypertension    followed  by pcp  . Nasal congestion    03-20-2020 per pt started two day ago  . OSA (obstructive sleep apnea)    severe osa w/ nocturnal hyoxemia per study 06-19-2019,  pt given cpap ( however on 08-30-2019 per pt having issues with mask fitting and on a new one so he not using the cpap);   (03-20-2020 still not using cpap, no mask)  . Palpitations    isolated event at ED 10-05-2019 Aflutter/ Afib versus SVT ;   refer to pt's cardiologist note w/ Dr Johney Frame  . Prostate 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 [facility]) urologist-- dr Tresa Moore   dx 06/ 2019-- advanced w/ mets to bone and pelvic lymphadeopathy  . Runny nose    03-20-2020 per pt started two days ago  . S/P arteriovenous (AV) fistula creation followed by dr Carlis Abbott (vascular)   03-15-2019  by dr Carlis Abbott, left branhiocephallic  . Secondary hyperparathyroidism of renal origin ([place])   . Wears glasses     Past Surgical History:  Procedure Laterality Date  . AV FISTULA PLACEMENT Left 03/15/2019   Procedure: ARTERIOVENOUS (AV) FISTULA CREATION LEFT ARM;  Surgeon: Marty Heck, MD;  Location: [place];  Service: Vascular;  Laterality: Left;  . COLONOSCOPY    . CYSTOSCOPY W/ URETERAL STENT PLACEMENT Bilateral 12/08/2017   Procedure: CYSTOSCOPY WITH RETROGRADE PYELOGRAM/URETERAL STENT PLACEMENT;  Surgeon: Alexis Frock, MD;  Location: [facility];  Service: Urology;  Laterality: Bilateral;  . CYSTOSCOPY W/ URETERAL STENT PLACEMENT Bilateral 04/20/2018   Procedure: CYSTOSCOPY WITH STENT REPLACEMENT;  Surgeon: Alexis Frock, MD;  [facility];  Service: Urology;  Laterality: Bilateral;  . CYSTOSCOPY W/ URETERAL STENT PLACEMENT Bilateral 08/10/2018   Procedure: CYSTOSCOPY WITH STENT REPLACEMENT, BILATERAL RETROGRADES;  Surgeon: Alexis Frock, MD;  Location: [facility];  Service: Urology;  Laterality: Bilateral;  45 MINS  . CYSTOSCOPY W/ URETERAL STENT PLACEMENT Bilateral 01/18/2019   Procedure: CYSTOSCOPY WITH  RETROGRADE PYELOGRAM/URETERAL STENT PLACEMENT;  Surgeon: Alexis Frock, MD;  Location: [facility];  Service: Urology;  Laterality: Bilateral;  . CYSTOSCOPY W/ URETERAL STENT PLACEMENT Bilateral 05/24/2019   Procedure: CYSTOSCOPY WITH RETROGRADE PYELOGRAM/BILATERAL URETERAL STENT EXCHANGE;  Surgeon: Alexis Frock, MD;  Location: [facility];  Service: Urology;  Laterality: Bilateral;  . CYSTOSCOPY W/ URETERAL STENT PLACEMENT Bilateral 09/06/2019   Procedure: CYSTOSCOPY WITH RETROGRADE PYELOGRAM/URETERAL STENT EXCHANGE;  Surgeon: Alexis Frock, MD;  Location: [facility];  Service: Urology;  Laterality: Bilateral;  45 MINS  . CYSTOSCOPY W/ URETERAL STENT PLACEMENT Bilateral 05/08/2020   Procedure: CYSTOSCOPY WITH RETROGRADE PYELOGRAM/URETERAL STENT REPLACEMENT;  Surgeon: Alexis Frock, MD;  Location: [facility];  Service: Urology;  Laterality: Bilateral;  . INSERTION OF DIALYSIS CATHETER Right 05/06/2019   Procedure: INSERTION OF TUNNELED  DIALYSIS CATHETER;  Surgeon: Rosetta Posner, MD;  Location: [place];  Service: Vascular;  Laterality: Right;  . IR NEPHROSTOMY PLACEMENT LEFT  11/15/2017  . IR NEPHROSTOMY PLACEMENT RIGHT  11/15/2017  . PROSTATE BIOPSY N/A 12/08/2017   Procedure: BIOPSY TRANSRECTAL ULTRASONIC PROSTATE (TUBP), PERINEAL BIOPSY;  Surgeon: Alexis Frock, MD;  Location: [facility];  Service: Urology;  Laterality: N/A;  . TRANSURETHRAL RESECTION OF BLADDER TUMOR N/A 12/08/2017   Procedure: TRANSURETHRAL RESECTION OF BLADDER TUMOR (TURBT);  Surgeon: Alexis Frock, MD;  Location: [facility];  Service: Urology;  Laterality: N/A;  . TRICEPS TENDON REPAIR Bilateral 08/2005  . UPPER GASTROINTESTINAL ENDOSCOPY      Current Medications: No outpatient medications have been marked as taking for the 08/03/20 encounter (Appointment) with Freada Bergeron, MD.     Allergies:   Patient has no known allergies.   Social History    Socioeconomic History  . Marital status: Married    Spouse name: Not on file  . Number of children: 5  . Years of education: Not on file  . Highest education level: Not on file  Occupational History  . Occupation: retired  Tobacco Use  . Smoking status: Current Some Day Smoker    Packs/day: 0.50    Years: 6.00    Pack years: 3.00    Types: Cigarettes, Pipe, Cigars    Last attempt to quit: 07/28/2019    Years since quitting: 1.0  . Smokeless tobacco: Never Used  . Tobacco comment: 03-20-2020  per pt 2 cigars per week;  quit cigarettes 07-28-2019  Vaping Use  . Vaping Use: Never used  Substance and Sexual Activity  . Alcohol use: Yes    Comment: 2 drinks per weekend  . Drug use: No  . Sexual activity: Yes  Other Topics Concern  . Not on file  Social History Narrative  . Not on file   Social Determinants of Health   Financial Resource Strain: Not on file  Food Insecurity: Not on file  Transportation Needs: Not on file  Physical Activity: Not on file  Stress: Not on file  Social Connections: Not on file  Family History: The patient's ***family history includes Diabetes in his brother, sister, and sister; Heart disease in his mother; Hypertension in his mother; Prostate cancer in his maternal grandfather and maternal uncle. There is no history of Colon polyps, Esophageal cancer, Stomach cancer, or Rectal cancer.  ROS:   Please see the history of present illness.    *** All other systems reviewed and are negative.  EKGs/Labs/Other Studies Reviewed:    The following studies were reviewed today: TTE 2020/05/27: IMPRESSIONS  1. Left ventricular ejection fraction, by estimation, is 45 to 50%. The  left ventricle has mildly decreased function. The left ventricle  demonstrates global hypokinesis. The left ventricular internal cavity size  was moderately dilated. There is mild  concentric left ventricular hypertrophy. Left ventricular diastolic  parameters are  consistent with Grade I diastolic dysfunction (impaired  relaxation). Elevated left atrial pressure.  2. Right ventricular systolic function is normal. The right ventricular  size is normal. There is moderately elevated pulmonary artery systolic  pressure. The estimated right ventricular systolic pressure is 37.9 mmHg.  3. Left atrial size was moderately dilated.  4. Right atrial size was mildly dilated.  5. The mitral valve is normal in structure. Mild mitral valve  regurgitation. No evidence of mitral stenosis.  6. The aortic valve is normal in structure. Aortic valve regurgitation is  not visualized. No aortic stenosis is present.  7. Aortic dilatation noted. There is mild dilatation of the ascending  aorta, measuring 42 mm.  8. The inferior vena cava is dilated in size with <50% respiratory  variability, suggesting right atrial pressure of 15 mmHg.  CT abdomen/pelvis 11/2017: IMPRESSION: 1. Bilateral hydronephrosis, moderate to severe in degree, most likely caused by the irregular thickening seen along the posterior bladder walls with presumed obstruction at the bilateral UVJs. This is highly suspicious for neoplastic bladder wall thickening. Recommend urology consultation for possible cystoscopy. Percutaneous nephrostomy (or nephrostomies) may also be needed. 2. Conglomerate lymphadenopathy throughout the retroperitoneum and bilateral iliac chain regions, metastatic lymphadenopathy versus lymphoma. Consider tissue sampling. 3. Single sclerotic focus at the anterior aspects of the L3 vertebral body. This is most likely a benign bone island given the single site, however, early osseous metastasis cannot be excluded. Would consider nonemergent MRI or nuclear medicine bone scan for further characterization.  Sleep study 06/2019: IMPRESSIONS  - Severe obstructive sleep apnea occurred during this study (AHI  = 99.8/h).  - No significant central sleep apnea occurred during  this study  (CAI = 0.0/h).  - Oxygen desaturation was noted during this study (Min O2 = 64%).  Mean sat 92%.  - Time with O2 saturation 89% or less was 79.9 minutes.  - Patient snored.   LE Ultrasound 11/2017: Right: There is no evidence of superficial venous thrombosis.There is no  evidence of deep vein thrombosis in the lower extremity. However, portions  of this examination were limited- see technologist comments above. No  cystic structure found in the  popliteal fossa.  Left: There is no evidence of deep vein thrombosis in the lower extremity.  However, portions of this examination were limited- see technologist  comments above.There is no evidence of superficial venous thrombosis. No  cystic structure found in the  popliteal fossa.   EKG:  EKG is *** ordered today.  The ekg ordered today demonstrates ***  Recent Labs: 10/05/2019: ALT 21; B Natriuretic Peptide 41.3; Magnesium 2.0; Platelets 331; TSH 2.732 05/08/2020: BUN 38; Creatinine, Ser 6.50; Hemoglobin 10.9; Potassium 3.9; Sodium 140  Recent Lipid Panel    Component Value Date/Time   CHOL 98 (L) 11/13/2017 0959   TRIG 72 11/13/2017 0959   HDL 32 (L) 11/13/2017 0959   CHOLHDL 3.1 11/13/2017 0959   CHOLHDL 2.8 09/07/2015 0001   VLDL 19 09/07/2015 0001   LDLCALC 52 11/13/2017 0959     Risk Assessment/Calculations:   {Does this patient have ATRIAL FIBRILLATION?:[id number]}   Physical Exam:    VS:  There were no vitals taken for this visit.    Wt Readings from Last 3 Encounters:  07/28/20 297 lb (134.7 kg)  05/26/20 297 lb (134.7 kg)  05/14/20 297 lb (134.7 kg)     GEN: *** Well nourished, well developed in no acute distress HEENT: Normal NECK: No JVD; No carotid bruits LYMPHATICS: No lymphadenopathy CARDIAC: ***RRR, no murmurs, rubs, gallops RESPIRATORY:  Clear to auscultation without rales, wheezing or rhonchi  ABDOMEN: Soft, non-tender, non-distended MUSCULOSKELETAL:  No edema; No deformity  SKIN: Warm  and dry NEUROLOGIC:  Alert and oriented x 3 PSYCHIATRIC:  Normal affect   ASSESSMENT:    No diagnosis found. PLAN:    In order of problems listed above:  #Chronic Combined Systolic and Diastolic Heart Failure: LVEF 45-50% with global hypokinesis, G1DD. Currently euvolemic on examination with NYHA class II symptoms.  -Volume management with HD -Continue metoprolol -No ACE/ARB/entresto/spiro due to ESRD -If needed, can add hydralazine/isordil for further afterload reduction  #Palpitations: #Suspected Aflutter: Isolated event per ED report with no further issues. CHADs-vasc 1 for hypertension although ESRD does increase risk and may merit anticoagulation if any further episodes.  -Check TTE -Start metop 25mg  XL -Continue ASA -TSH 0.83 (normal) -If recurs, will have a discussion for risk vs benefits of anticoagulation at that time; would not need bridging for any operation given low CHADs-vasc -May consider ziopatch in future to determine if any asymptomatic episodes  #Mild-to-moderate ascending aortic dilation: Ascending aorta measures 29mm on TTE.  -Needs serial yearly surveillance with either CT or TTE -Aggressive blood pressure control  #HTN: Elevated BP 140/90 in the office today. Monitored closely at HD. -Will monitor at home x 5days and send in results -Likely needs some BP room in order to tolerate HD -Continue amlodipine 10mg   -Start metoprolol 25mg  XL  #ESRD on HD: #Metastatic prostate cancer Secondary to obstructive uropathy in the setting of metastatic prostate cancer. -Continue HD as scheduled -Follow-up with urology as scheduled   {Are you ordering a CV Procedure (e.g. stress test, cath, DCCV, TEE, etc)?   Press F2        :[id number]}    Medication Adjustments/Labs and Tests Ordered: Current medicines are reviewed at length with the patient today.  Concerns regarding medicines are outlined above.  No orders of the defined types were placed in this  encounter.  No orders of the defined types were placed in this encounter.   There are no Patient Instructions on file for this visit.   Signed, Freada Bergeron, MD  07/28/2020 12:55 PM    [place]

## 2020-07-28 NOTE — Progress Notes (Signed)
   Subjective:    Patient ID: Devon Cook, male    DOB: 03/16/1961, 60 y.o.   MRN: [id number]  HPI I connected with  Devon Cook on 07/28/20 by a video enabled telemedicine application and verified that I am speaking with the correct person using two identifiers.  Caregility used Me;office Patient at home I discussed the limitations of evaluation and management by telemedicine. The patient expressed understanding and agreed to proceed. He states that on Thursday he developed chest congestion, nasal congestion, slight cough with headache and sinus pressure.  Last Saturday he did a Covid test which was positive.  He repeated it and again was positive.  He states that he is now feeling much better.  He is scheduled to have another hemodialysis done.   Review of Systems     Objective:   Physical Exam Alert and in no distress otherwise not examined       Assessment & Plan:  COVID-19  ESRD on dialysis ([place]) I explained that under normal circumstances according to CDC recommendations, he could potentially resume normal activities after today since it would be in 5 days.  He states that he is feeling better.  I deferred to the hemodialysis unit as per their protocol for Covid.  He will discuss this further with him.  Also discussed getting worse in terms of cough, fever, shortness of breath 20 minutes spent reviewing his medical record, counseling and documentation.

## 2020-07-29 ENCOUNTER — Other Ambulatory Visit (HOSPITAL_COMMUNITY): Payer: 59

## 2020-07-31 ENCOUNTER — Telehealth: Payer: Self-pay

## 2020-07-31 ENCOUNTER — Encounter (HOSPITAL_COMMUNITY): Admission: RE | Payer: Self-pay | Source: Home / Self Care

## 2020-07-31 ENCOUNTER — Ambulatory Visit (HOSPITAL_COMMUNITY): Admission: RE | Admit: 2020-07-31 | Payer: 59 | Source: Home / Self Care | Admitting: Vascular Surgery

## 2020-07-31 SURGERY — A/V FISTULAGRAM
Anesthesia: LOCAL

## 2020-07-31 NOTE — Telephone Encounter (Signed)
Patient cancelled fistulogram today due to positive covid result. Was advised by MD to call HD and determine if patient still have access issues. According to HD RN, patient refuses cannulation because he does not know if the site is ready. Will put procedure on hold for now, and schedule patient on for VVS OV with MD and dialysis access duplex.

## 2020-08-03 ENCOUNTER — Ambulatory Visit: Payer: 59 | Admitting: Cardiology

## 2020-08-09 NOTE — Progress Notes (Deleted)
Cardiology Office Note:    Date:  08/09/2020   ID:  Devon Cook, DOB [birth year], MRN [id number]  PCP:  Devon Lung, MD  [facility] Cardiologist:  No primary care provider on file.  [facility] Electrophysiologist:  None   Referring MD: Devon Lung, MD    History of Present Illness:    Devon Cook is a 60 y.o. male with a hx of ESRD on HD, metastatic prostate cancer, HTN, HLD, OSA on CPAP, and Afibwho presents to clinic for follow-up.  Patient presented for pre-operative clearance for ureteral stent placement during our last visit on 04/08/20. He was able to perform >4METs during that visit.TTE with LVEF 45-50% with global hypokinesis, G1DD, moderate LAE, mild RAE, ascending aortic dilation to 62mm. He was cleared for his procedure which took place on 05/08/20 which he tolerated well.  He now presents to clinic for follow-up.  Past Medical History:  Diagnosis Date  . Anemia, chronic renal failure    followed by dr Hollie Salk--- dx 05/ 2019---  treated with Iron infusions and procrit  . Bilateral hydronephrosis urologist-- dr Tresa Moore   malignant-- chronic;  treated with ureteral stents  . Chronic gastritis    07-17-2019 and duodenitis  . ED (erectile dysfunction)   . Edema of both lower extremities    08-30-2019  per pt edema has much improved since started hemodialysis  . End stage renal failure on dialysis [facility]) hemodialysis T/TH/S @ [facility]---started first week of Nov 2020   [facility]rologist[facility] secondary to obstructive uropathy secondary to prostate cancer  . GERD (gastroesophageal reflux disease)   . Gout    03-20-2020 per pt unsure when last episode but it has been yrs  . Herpes genitalis in men   . History of adenomatous polyp of colon   . History of hepatitis C 2017   dx 11/ 207-- treated with Harvoni (in epic 04/ 2019 lab result non-detectable)  . History of lower GI bleeding 11/2017  . Hypertension    followed by  pcp  . Nasal congestion    03-20-2020 per pt started two day ago  . OSA (obstructive sleep apnea)    severe osa w/ nocturnal hyoxemia per study 06-19-2019,  pt given cpap ( however on 08-30-2019 per pt having issues with mask fitting and on a new one so he not using the cpap);   (03-20-2020 still not using cpap, no mask)  . Palpitations    isolated event at ED 10-05-2019 Aflutter/ Afib versus SVT ;   refer to pt's cardiologist note w/ Dr Johney Frame  . Prostate 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 [facility]) urologist-- dr Tresa Moore   dx 06/ 2019-- advanced w/ mets to bone and pelvic lymphadeopathy  . Runny nose    03-20-2020 per pt started two days ago  . S/P arteriovenous (AV) fistula creation followed by dr Carlis Abbott (vascular)   03-15-2019  by dr Carlis Abbott, left branhiocephallic  . Secondary hyperparathyroidism of renal origin ([place])   . Wears glasses     Past Surgical History:  Procedure Laterality Date  . AV FISTULA PLACEMENT Left 03/15/2019   Procedure: ARTERIOVENOUS (AV) FISTULA CREATION LEFT ARM;  Surgeon: Marty Heck, MD;  Location: [place];  Service: Vascular;  Laterality: Left;  . COLONOSCOPY    . CYSTOSCOPY W/ URETERAL STENT PLACEMENT Bilateral 12/08/2017   Procedure: CYSTOSCOPY WITH RETROGRADE PYELOGRAM/URETERAL STENT PLACEMENT;  Surgeon: Alexis Frock, MD;  Location: [facility];  Service: Urology;  Laterality:  Bilateral;  . CYSTOSCOPY W/ URETERAL STENT PLACEMENT Bilateral 04/20/2018   Procedure: CYSTOSCOPY WITH STENT REPLACEMENT;  Surgeon: Alexis Frock, MD;  Location[facility];  Service: Urology;  Laterality: Bilateral;  . CYSTOSCOPY W/ URETERAL STENT PLACEMENT Bilateral 08/10/2018   Procedure: CYSTOSCOPY WITH STENT REPLACEMENT, BILATERAL RETROGRADES;  Surgeon: Alexis Frock, MD;  Location: [facility];  Service: Urology;  Laterality: Bilateral;  45 MINS  . CYSTOSCOPY W/ URETERAL STENT PLACEMENT Bilateral 01/18/2019   Procedure: CYSTOSCOPY WITH RETROGRADE  PYELOGRAM/URETERAL STENT PLACEMENT;  Surgeon: Alexis Frock, MD;  Location: [facility];  Service: Urology;  Laterality: Bilateral;  . CYSTOSCOPY W/ URETERAL STENT PLACEMENT Bilateral 05/24/2019   Procedure: CYSTOSCOPY WITH RETROGRADE PYELOGRAM/BILATERAL URETERAL STENT EXCHANGE;  Surgeon: Alexis Frock, MD;  Location: [facility];  Service: Urology;  Laterality: Bilateral;  . CYSTOSCOPY W/ URETERAL STENT PLACEMENT Bilateral 09/06/2019   Procedure: CYSTOSCOPY WITH RETROGRADE PYELOGRAM/URETERAL STENT EXCHANGE;  Surgeon: Alexis Frock, MD;  Location: [facility];  Service: Urology;  Laterality: Bilateral;  45 MINS  . CYSTOSCOPY W/ URETERAL STENT PLACEMENT Bilateral 05/08/2020   Procedure: CYSTOSCOPY WITH RETROGRADE PYELOGRAM/URETERAL STENT REPLACEMENT;  Surgeon: Alexis Frock, MD;  Location: [facility];  Service: Urology;  Laterality: Bilateral;  . INSERTION OF DIALYSIS CATHETER Right 05/06/2019   Procedure: INSERTION OF TUNNELED  DIALYSIS CATHETER;  Surgeon: Rosetta Posner, MD;  Location: [place];  Service: Vascular;  Laterality: Right;  . IR NEPHROSTOMY PLACEMENT LEFT  11/15/2017  . IR NEPHROSTOMY PLACEMENT RIGHT  11/15/2017  . PROSTATE BIOPSY N/A 12/08/2017   Procedure: BIOPSY TRANSRECTAL ULTRASONIC PROSTATE (TUBP), PERINEAL BIOPSY;  Surgeon: Alexis Frock, MD;  Location: [facility];  Service: Urology;  Laterality: N/A;  . TRANSURETHRAL RESECTION OF BLADDER TUMOR N/A 12/08/2017   Procedure: TRANSURETHRAL RESECTION OF BLADDER TUMOR (TURBT);  Surgeon: Alexis Frock, MD;  Location: [facility];  Service: Urology;  Laterality: N/A;  . TRICEPS TENDON REPAIR Bilateral 08/2005  . UPPER GASTROINTESTINAL ENDOSCOPY      Current Medications: No outpatient medications have been marked as taking for the 08/10/20 encounter (Appointment) with Freada Bergeron, MD.     Allergies:   Patient has no known allergies.   Social History   Socioeconomic History   . Marital status: Married    Spouse name: Not on file  . Number of children: 5  . Years of education: Not on file  . Highest education level: Not on file  Occupational History  . Occupation: retired  Tobacco Use  . Smoking status: Current Some Day Smoker    Packs/day: 0.50    Years: 6.00    Pack years: 3.00    Types: Cigarettes, Pipe, Cigars    Last attempt to quit: 07/28/2019    Years since quitting: 1.0  . Smokeless tobacco: Never Used  . Tobacco comment: 03-20-2020  per pt 2 cigars per week;  quit cigarettes 07-28-2019  Vaping Use  . Vaping Use: Never used  Substance and Sexual Activity  . Alcohol use: Yes    Comment: 2 drinks per weekend  . Drug use: No  . Sexual activity: Yes  Other Topics Concern  . Not on file  Social History Narrative  . Not on file   Social Determinants of Health   Financial Resource Strain: Not on file  Food Insecurity: Not on file  Transportation Needs: Not on file  Physical Activity: Not on file  Stress: Not on file  Social Connections: Not on file  Family History: The patient's ***family history includes Diabetes in his brother, sister, and sister; Heart disease in his mother; Hypertension in his mother; Prostate cancer in his maternal grandfather and maternal uncle. There is no history of Colon polyps, Esophageal cancer, Stomach cancer, or Rectal cancer.  ROS:   Please see the history of present illness.    *** All other systems reviewed and are negative.  EKGs/Labs/Other Studies Reviewed:    The following studies were reviewed today: TTE 2020/05/05: IMPRESSIONS  1. Left ventricular ejection fraction, by estimation, is 45 to 50%. The  left ventricle has mildly decreased function. The left ventricle  demonstrates global hypokinesis. The left ventricular internal cavity size  was moderately dilated. There is mild  concentric left ventricular hypertrophy. Left ventricular diastolic  parameters are consistent with Grade I diastolic  dysfunction (impaired  relaxation). Elevated left atrial pressure.  2. Right ventricular systolic function is normal. The right ventricular  size is normal. There is moderately elevated pulmonary artery systolic  pressure. The estimated right ventricular systolic pressure is 02.6 mmHg.  3. Left atrial size was moderately dilated.  4. Right atrial size was mildly dilated.  5. The mitral valve is normal in structure. Mild mitral valve  regurgitation. No evidence of mitral stenosis.  6. The aortic valve is normal in structure. Aortic valve regurgitation is  not visualized. No aortic stenosis is present.  7. Aortic dilatation noted. There is mild dilatation of the ascending  aorta, measuring 42 mm.  8. The inferior vena cava is dilated in size with <50% respiratory  variability, suggesting right atrial pressure of 15 mmHg.  CT abdomen/pelvis 11/2017: IMPRESSION: 1. Bilateral hydronephrosis, moderate to severe in degree, most likely caused by the irregular thickening seen along the posterior bladder walls with presumed obstruction at the bilateral UVJs. This is highly suspicious for neoplastic bladder wall thickening. Recommend urology consultation for possible cystoscopy. Percutaneous nephrostomy (or nephrostomies) may also be needed. 2. Conglomerate lymphadenopathy throughout the retroperitoneum and bilateral iliac chain regions, metastatic lymphadenopathy versus lymphoma. Consider tissue sampling. 3. Single sclerotic focus at the anterior aspects of the L3 vertebral body. This is most likely a benign bone island given the single site, however, early osseous metastasis cannot be excluded. Would consider nonemergent MRI or nuclear medicine bone scan for further characterization.  Sleep study 06/2019: IMPRESSIONS  - Severe obstructive sleep apnea occurred during this study (AHI  = 99.8/h).  - No significant central sleep apnea occurred during this study  (CAI = 0.0/h).  -  Oxygen desaturation was noted during this study (Min O2 = 64%).  Mean sat 92%.  - Time with O2 saturation 89% or less was 79.9 minutes.  - Patient snored.   LE Ultrasound 11/2017: Right: There is no evidence of superficial venous thrombosis.There is no  evidence of deep vein thrombosis in the lower extremity. However, portions  of this examination were limited- see technologist comments above. No  cystic structure found in the  popliteal fossa.  Left: There is no evidence of deep vein thrombosis in the lower extremity.  However, portions of this examination were limited- see technologist  comments above.There is no evidence of superficial venous thrombosis. No  cystic structure found in the  popliteal fossa.   EKG:  EKG is *** ordered today.  The ekg ordered today demonstrates ***  Recent Labs: 10/05/2019: ALT 21; B Natriuretic Peptide 41.3; Magnesium 2.0; Platelets 331; TSH 2.732 05/08/2020: BUN 38; Creatinine, Ser 6.50; Hemoglobin 10.9; Potassium 3.9; Sodium 140  Recent Lipid Panel    Component Value Date/Time   CHOL 98 (L) 11/13/2017 0959   TRIG 72 11/13/2017 0959   HDL 32 (L) 11/13/2017 0959   CHOLHDL 3.1 11/13/2017 0959   CHOLHDL 2.8 09/07/2015 0001   VLDL 19 09/07/2015 0001   LDLCALC 52 11/13/2017 0959     Risk Assessment/Calculations:   {Does this patient have ATRIAL FIBRILLATION?:[phone]}   Physical Exam:    VS:  There were no vitals taken for this visit.    Wt Readings from Last 3 Encounters:  07/28/20 297 lb (134.7 kg)  05/26/20 297 lb (134.7 kg)  05/14/20 297 lb (134.7 kg)     GEN: *** Well nourished, well developed in no acute distress HEENT: Normal NECK: No JVD; No carotid bruits LYMPHATICS: No lymphadenopathy CARDIAC: ***RRR, no murmurs, rubs, gallops RESPIRATORY:  Clear to auscultation without rales, wheezing or rhonchi  ABDOMEN: Soft, non-tender, non-distended MUSCULOSKELETAL:  No edema; No deformity  SKIN: Warm and dry NEUROLOGIC:  Alert and  oriented x 3 PSYCHIATRIC:  Normal affect   ASSESSMENT:    No diagnosis found. PLAN:    In order of problems listed above:  #Chronic Combined Systolic and DiastolicHeart Failure: LVEF 45-50% with global hypokinesis, G1DD. Currently euvolemic on examination with NYHA class II symptoms.  -Volume management with HD -Continue metoprolol -No ACE/ARB/entresto/spiro due to ESRD -If needed, can add hydralazine/isordil for further afterload reduction  #Palpitations: #Suspected Aflutter: Isolated event per ED report with no further issues. CHADs-vasc 1 for hypertension although ESRD does increase risk and may merit anticoagulation if any further episodes.  -Check TTE -Start metop 25mg  XL -Continue ASA -TSH 0.83 (normal) -If recurs, will have a discussion for risk vs benefits of anticoagulation at that time; would not need bridging for any operation given low CHADs-vasc -May consider ziopatch in future to determine if any asymptomatic episodes  #Mild-to-moderateascending aortic dilation: Ascending aorta measures 44mm on TTE.  -Needs serial yearly surveillance with either CT or TTE--next 04/2021 -Aggressive blood pressure control  #HTN: -Likely needs some BP room in order to tolerate HD -Continue amlodipine 10mg   -Start metoprolol 25mg  XL  #ESRD on HD: #Metastatic prostate cancer Secondary to obstructive uropathy in the setting of metastatic prostate cancer. -Continue HD as scheduled -Follow-up with urology as scheduled   {Are you ordering a CV Procedure (e.g. stress test, cath, DCCV, TEE, etc)?   Press F2        :[id number]}    Medication Adjustments/Labs and Tests Ordered: Current medicines are reviewed at length with the patient today.  Concerns regarding medicines are outlined above.  No orders of the defined types were placed in this encounter.  No orders of the defined types were placed in this encounter.   There are no Patient Instructions on file for this visit.    Signed, Freada Bergeron, MD  08/09/2020 8:23 AM    [facility]

## 2020-08-10 ENCOUNTER — Ambulatory Visit: Payer: 59 | Admitting: Cardiology

## 2020-08-12 ENCOUNTER — Ambulatory Visit: Payer: 59 | Admitting: Vascular Surgery

## 2020-08-16 NOTE — Progress Notes (Deleted)
Cardiology Office Note:    Date:  08/16/2020   ID:  Devon Cook, DOB [birth year], MRN [id number]  PCP:  [name], MD   [place]  Cardiologist:  Freada Bergeron, MD *** Advanced Practice Provider:  No care team member to display Electrophysiologist:  None  {Press F2 to show EP APP, CHF, sleep or structural heart MD               :[id number]}  { Click here to update then REFRESH NOTE - MD (PCP) or APP (Team Member)  Change PCP Type for MD, Specialty for APP is either Cardiology or Clinical Cardiac Electrophysiology  :[id number]}   Referring MD: [name], MD    History of Present Illness:    Devon Cook is a 60 y.o. male  with a hx of ESRD on HD, metastatic prostate cancer, HTN, HLD, OSA on CPAP, and Afibwho presents to clinic for follow-up.  Patient presented for pre-operative clearance for ureteral stent placement during our last visit on 04/08/20. He was able to perform >4METs during that visit.TTE with LVEF 45-50% with global hypokinesis, G1DD, moderate LAE, mild RAE, ascending aortic dilation to 79mm. He was cleared for his procedure which took place on 05/08/20 which he tolerated well.  He now presents to clinic for follow-up.  Past Medical History:  Diagnosis Date  . Anemia, chronic renal failure    followed by dr Hollie Salk--- dx 05/ 2019---  treated with Iron infusions and procrit  . Bilateral hydronephrosis urologist-- dr Tresa Moore   malignant-- chronic;  treated with ureteral stents  . Chronic gastritis    07-17-2019 and duodenitis  . ED (erectile dysfunction)   . Edema of both lower extremities    08-30-2019  per pt edema has much improved since started hemodialysis  . End stage renal failure on dialysis [facility]) hemodialysis T/TH/S @ [facility]---started first week of Nov 2020   [facility]rolog[facility] secondary to obstructive uropathy secondary to prostate cancer  . GERD (gastroesophageal reflux  disease)   . Gout    03-20-2020 per pt unsure when last episode but it has been yrs  . Herpes genitalis in men   . History of adenomatous polyp of colon   . History of hepatitis C 2017   dx 11/ 207-- treated with Harvoni (in epic 04/ 2019 lab result non-detectable)  . History of lower GI bleeding 11/2017  . Hypertension    followed by pcp  . Nasal congestion    03-20-2020 per pt started two day ago  . OSA (obstructive sleep apnea)    severe osa w/ nocturnal hyoxemia per study 06-19-2019,  pt given cpap ( however on 08-30-2019 per pt having issues with mask fitting and on a new one so he not using the cpap);   (03-20-2020 still not using cpap, no mask)  . Palpitations    isolated event at ED 10-05-2019 Aflutter/ Afib versus SVT ;   refer to pt's cardiologist note w/ Dr Johney Frame  . Prostate 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 [facility]) urologist-- dr Tresa Moore   dx 06/ 2019-- advanced w/ mets to bone and pelvic lymphadeopathy  . Runny nose    03-20-2020 per pt started two days ago  . S/P arteriovenous (AV) fistula creation followed by dr Carlis Abbott (vascular)   03-15-2019  by dr Carlis Abbott, left branhiocephallic  . Secondary hyperparathyroidism of renal origin (Greasy)   . Wears glasses     Past Surgical  History:  Procedure Laterality Date  . AV FISTULA PLACEMENT Left 03/15/2019   Procedure: ARTERIOVENOUS (AV) FISTULA CREATION LEFT ARM;  Surgeon: Marty Heck, MD;  Location: [place];  Service: Vascular;  Laterality: Left;  . COLONOSCOPY    . CYSTOSCOPY W/ URETERAL STENT PLACEMENT Bilateral 12/08/2017   Procedure: CYSTOSCOPY WITH RETROGRADE PYELOGRAM/URETERAL STENT PLACEMENT;  Surgeon: Alexis Frock, MD;  Location: [facility];  Service: Urology;  Laterality: Bilateral;  . CYSTOSCOPY W/ URETERAL STENT PLACEMENT Bilateral 04/20/2018   Procedure: CYSTOSCOPY WITH STENT REPLACEMENT;  Surgeon: Alexis Frock, MD;  Location: [facility];  Service: Urology;  Laterality: Bilateral;  . CYSTOSCOPY  W/ URETERAL STENT PLACEMENT Bilateral 08/10/2018   Procedure: CYSTOSCOPY WITH STENT REPLACEMENT, BILATERAL RETROGRADES;  Surgeon: Alexis Frock, MD;  Location: [facility];  Service: Urology;  Laterality: Bilateral;  45 MINS  . CYSTOSCOPY W/ URETERAL STENT PLACEMENT Bilateral 01/18/2019   Procedure: CYSTOSCOPY WITH RETROGRADE PYELOGRAM/URETERAL STENT PLACEMENT;  Surgeon: Alexis Frock, MD;  Location: [facility];  Service: Urology;  Laterality: Bilateral;  . CYSTOSCOPY W/ URETERAL STENT PLACEMENT Bilateral 05/24/2019   Procedure: CYSTOSCOPY WITH RETROGRADE PYELOGRAM/BILATERAL URETERAL STENT EXCHANGE;  Surgeon: Alexis Frock, MD;  Location: [facility];  Service: Urology;  Laterality: Bilateral;  . CYSTOSCOPY W/ URETERAL STENT PLACEMENT Bilateral 09/06/2019   Procedure: CYSTOSCOPY WITH RETROGRADE PYELOGRAM/URETERAL STENT EXCHANGE;  Surgeon: Alexis Frock, MD;  Location: [facility];  Service: Urology;  Laterality: Bilateral;  45 MINS  . CYSTOSCOPY W/ URETERAL STENT PLACEMENT Bilateral 05/08/2020   Procedure: CYSTOSCOPY WITH RETROGRADE PYELOGRAM/URETERAL STENT REPLACEMENT;  Surgeon: Alexis Frock, MD;  Location: [facility];  Service: Urology;  Laterality: Bilateral;  . INSERTION OF DIALYSIS CATHETER Right 05/06/2019   Procedure: INSERTION OF TUNNELED  DIALYSIS CATHETER;  Surgeon: Rosetta Posner, MD;  Location: [place];  Service: Vascular;  Laterality: Right;  . IR NEPHROSTOMY PLACEMENT LEFT  11/15/2017  . IR NEPHROSTOMY PLACEMENT RIGHT  11/15/2017  . PROSTATE BIOPSY N/A 12/08/2017   Procedure: BIOPSY TRANSRECTAL ULTRASONIC PROSTATE (TUBP), PERINEAL BIOPSY;  Surgeon: Alexis Frock, MD;  Location: [facility];  Service: Urology;  Laterality: N/A;  . TRANSURETHRAL RESECTION OF BLADDER TUMOR N/A 12/08/2017   Procedure: TRANSURETHRAL RESECTION OF BLADDER TUMOR (TURBT);  Surgeon: Alexis Frock, MD;  Location: [facility];  Service: Urology;   Laterality: N/A;  . TRICEPS TENDON REPAIR Bilateral 08/2005  . UPPER GASTROINTESTINAL ENDOSCOPY      Current Medications: No outpatient medications have been marked as taking for the 08/17/20 encounter (Appointment) with Freada Bergeron, MD.     Allergies:   Patient has no known allergies.   Social History   Socioeconomic History  . Marital status: Married    Spouse name: Not on file  . Number of children: 5  . Years of education: Not on file  . Highest education level: Not on file  Occupational History  . Occupation: retired  Tobacco Use  . Smoking status: Current Some Day Smoker    Packs/day: 0.50    Years: 6.00    Pack years: 3.00    Types: Cigarettes, Pipe, Cigars    Last attempt to quit: 07/28/2019    Years since quitting: 1.0  . Smokeless tobacco: Never Used  . Tobacco comment: 03-20-2020  per pt 2 cigars per week;  quit cigarettes 07-28-2019  Vaping Use  . Vaping Use: Never used  Substance and Sexual Activity  . Alcohol use: Yes    Comment: 2 drinks  per weekend  . Drug use: No  . Sexual activity: Yes  Other Topics Concern  . Not on file  Social History Narrative  . Not on file   Social Determinants of Health   Financial Resource Strain: Not on file  Food Insecurity: Not on file  Transportation Needs: Not on file  Physical Activity: Not on file  Stress: Not on file  Social Connections: Not on file     Family History: The patient's ***family history includes Diabetes in his brother, sister, and sister; Heart disease in his mother; Hypertension in his mother; Prostate cancer in his maternal grandfather and maternal uncle. There is no history of Colon polyps, Esophageal cancer, Stomach cancer, or Rectal cancer.  ROS:   Please see the history of present illness.    *** All other systems reviewed and are negative.  EKGs/Labs/Other Studies Reviewed:    The following studies were reviewed today: TTE May 03, 2020: IMPRESSIONS  1. Left ventricular  ejection fraction, by estimation, is 45 to 50%. The  left ventricle has mildly decreased function. The left ventricle  demonstrates global hypokinesis. The left ventricular internal cavity size  was moderately dilated. There is mild  concentric left ventricular hypertrophy. Left ventricular diastolic  parameters are consistent with Grade I diastolic dysfunction (impaired  relaxation). Elevated left atrial pressure.  2. Right ventricular systolic function is normal. The right ventricular  size is normal. There is moderately elevated pulmonary artery systolic  pressure. The estimated right ventricular systolic pressure is 67.2 mmHg.  3. Left atrial size was moderately dilated.  4. Right atrial size was mildly dilated.  5. The mitral valve is normal in structure. Mild mitral valve  regurgitation. No evidence of mitral stenosis.  6. The aortic valve is normal in structure. Aortic valve regurgitation is  not visualized. No aortic stenosis is present.  7. Aortic dilatation noted. There is mild dilatation of the ascending  aorta, measuring 42 mm.  8. The inferior vena cava is dilated in size with <50% respiratory  variability, suggesting right atrial pressure of 15 mmHg.  CT abdomen/pelvis 11/2017: IMPRESSION: 1. Bilateral hydronephrosis, moderate to severe in degree, most likely caused by the irregular thickening seen along the posterior bladder walls with presumed obstruction at the bilateral UVJs. This is highly suspicious for neoplastic bladder wall thickening. Recommend urology consultation for possible cystoscopy. Percutaneous nephrostomy (or nephrostomies) may also be needed. 2. Conglomerate lymphadenopathy throughout the retroperitoneum and bilateral iliac chain regions, metastatic lymphadenopathy versus lymphoma. Consider tissue sampling. 3. Single sclerotic focus at the anterior aspects of the L3 vertebral body. This is most likely a benign bone island given the single  site, however, early osseous metastasis cannot be excluded. Would consider nonemergent MRI or nuclear medicine bone scan for further characterization.  Sleep study 06/2019: IMPRESSIONS  - Severe obstructive sleep apnea occurred during this study (AHI  = 99.8/h).  - No significant central sleep apnea occurred during this study  (CAI = 0.0/h).  - Oxygen desaturation was noted during this study (Min O2 = 64%).  Mean sat 92%.  - Time with O2 saturation 89% or less was 79.9 minutes.  - Patient snored.   LE Ultrasound 11/2017: Right: There is no evidence of superficial venous thrombosis.There is no  evidence of deep vein thrombosis in the lower extremity. However, portions  of this examination were limited- see technologist comments above. No  cystic structure found in the  popliteal fossa.  Left: There is no evidence of deep vein thrombosis in the  lower extremity.  However, portions of this examination were limited- see technologist  comments above.There is no evidence of superficial venous thrombosis. No  cystic structure found in the  popliteal fossa.   EKG:  EKG is *** ordered today.  The ekg ordered today demonstrates ***  Recent Labs: 10/05/2019: ALT 21; B Natriuretic Peptide 41.3; Magnesium 2.0; Platelets 331; TSH 2.732 05/08/2020: BUN 38; Creatinine, Ser 6.50; Hemoglobin 10.9; Potassium 3.9; Sodium 140  Recent Lipid Panel    Component Value Date/Time   CHOL 98 (L) 11/13/2017 0959   TRIG 72 11/13/2017 0959   HDL 32 (L) 11/13/2017 0959   CHOLHDL 3.1 11/13/2017 0959   CHOLHDL 2.8 09/07/2015 0001   VLDL 19 09/07/2015 0001   LDLCALC 52 11/13/2017 0959     Risk Assessment/Calculations:   {Does this patient have ATRIAL FIBRILLATION?:[id number]}   Physical Exam:    VS:  There were no vitals taken for this visit.    Wt Readings from Last 3 Encounters:  07/28/20 297 lb (134.7 kg)  05/26/20 297 lb (134.7 kg)  05/14/20 297 lb (134.7 kg)     GEN: *** Well nourished,  well developed in no acute distress HEENT: Normal NECK: No JVD; No carotid bruits LYMPHATICS: No lymphadenopathy CARDIAC: ***RRR, no murmurs, rubs, gallops RESPIRATORY:  Clear to auscultation without rales, wheezing or rhonchi  ABDOMEN: Soft, non-tender, non-distended MUSCULOSKELETAL:  No edema; No deformity  SKIN: Warm and dry NEUROLOGIC:  Alert and oriented x 3 PSYCHIATRIC:  Normal affect   ASSESSMENT:    No diagnosis found. PLAN:    In order of problems listed above:  #Chronic Combined Systolic and DiastolicHeart Failure: LVEF 45-50% with global hypokinesis, G1DD. Currently euvolemic on examination with NYHA class II symptoms.  -Volume management with HD -Continue metoprolol -No ACE/ARB/entresto/spiro due to ESRD -If needed, can add hydralazine/isordil for further afterload reduction  #Palpitations: #Suspected Aflutter: Isolated event per ED report with no further issues. CHADs-vasc 1 for hypertension although ESRD does increase risk and may merit anticoagulation if any further episodes.  -Check TTE -Start metop 25mg  XL -Continue ASA -TSH 0.83 (normal) -If recurs, will have a discussion for risk vs benefits of anticoagulation at that time; would not need bridging for any operation given low CHADs-vasc -May consider ziopatch in future to determine if any asymptomatic episodes  #Mild-to-moderateascending aortic dilation: Ascending aorta measures 69mm on TTE.  -Needs serial yearly surveillance with either CT or TTE--next 04/2021 -Aggressive blood pressure control  #HTN: -Likely needs some BP room in order to tolerate HD -Continue amlodipine 10mg   -Start metoprolol 25mg  XL  #ESRD on HD: #Metastatic prostate cancer Secondary to obstructive uropathy in the setting of metastatic prostate cancer. -Continue HD as scheduled -Follow-up with urology as scheduled    {Are you ordering a CV Procedure (e.g. stress test, cath, DCCV, TEE, etc)?   Press F2         :[id number]}    Medication Adjustments/Labs and Tests Ordered: Current medicines are reviewed at length with the patient today.  Concerns regarding medicines are outlined above.  No orders of the defined types were placed in this encounter.  No orders of the defined types were placed in this encounter.   There are no Patient Instructions on file for this visit.   Signed, Freada Bergeron, MD  08/16/2020 6:45 PM    [facility]

## 2020-08-17 ENCOUNTER — Ambulatory Visit: Payer: 59 | Admitting: Cardiology

## 2020-08-25 NOTE — Progress Notes (Deleted)
Cardiology Office Note:    Date:  08/25/2020   ID:  Devon Cook, DOB 06/14/1961, MRN [id number]  PCP:  [name], MD   [place]  Cardiologist:  Freada Bergeron, MD *** Advanced Practice Provider:  No care team member to display Electrophysiologist:  None  {Press F2 to show EP APP, CHF, sleep or structural heart MD               :[id number]}  { Click here to update then REFRESH NOTE - MD (PCP) or APP (Team Member)  Change PCP Type for MD, Specialty for APP is either Cardiology or Clinical Cardiac Electrophysiology  :[id number]}   Referring MD: [name], MD   No chief complaint on file. ***  History of Present Illness:    Devon Cook is a 60 y.o. male with a hx of ESRD on HD, metastatic prostate cancer, HTN, HLD, OSA on CPAP, and Afibwho presents to clinic for follow-up.  Patient presented for pre-operative clearance for ureteral stent placement during our last visit on 04/08/20. He was able to perform >4METs during that visit.TTE with LVEF 45-50% with global hypokinesis, G1DD, moderate LAE, mild RAE, ascending aortic dilation to 6mm. He was cleared for his procedure which took place on 05/08/20 which he tolerated well.  He now presents to clinic for follow-up.  Past Medical History:  Diagnosis Date  . Anemia, chronic renal failure    followed by dr Hollie Salk--- dx 05/ 2019---  treated with Iron infusions and procrit  . Bilateral hydronephrosis urologist-- dr Tresa Moore   malignant-- chronic;  treated with ureteral stents  . Chronic gastritis    07-17-2019 and duodenitis  . ED (erectile dysfunction)   . Edema of both lower extremities    08-30-2019  per pt edema has much improved since started hemodialysis  . End stage renal failure on dialysis [facility]) hemodialysis T/TH/S @ [facility]---started first week of Nov 2020   [facility]rolog[facility] secondary to obstructive uropathy secondary to prostate cancer   . GERD (gastroesophageal reflux disease)   . Gout    03-20-2020 per pt unsure when last episode but it has been yrs  . Herpes genitalis in men   . History of adenomatous polyp of colon   . History of hepatitis C 2017   dx 11/ 207-- treated with Harvoni (in epic 04/ 2019 lab result non-detectable)  . History of lower GI bleeding 11/2017  . Hypertension    followed by pcp  . Nasal congestion    03-20-2020 per pt started two day ago  . OSA (obstructive sleep apnea)    severe osa w/ nocturnal hyoxemia per study 06-19-2019,  pt given cpap ( however on 08-30-2019 per pt having issues with mask fitting and on a new one so he not using the cpap);   (03-20-2020 still not using cpap, no mask)  . Palpitations    isolated event at ED 10-05-2019 Aflutter/ Afib versus SVT ;   refer to pt's cardiologist note w/ Dr Johney Frame  . Prostate 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 [facility]) urologist-- dr Tresa Moore   dx 06/ 2019-- advanced w/ mets to bone and pelvic lymphadeopathy  . Runny nose    03-20-2020 per pt started two days ago  . S/P arteriovenous (AV) fistula creation followed by dr Carlis Abbott (vascular)   03-15-2019  by dr Carlis Abbott, left branhiocephallic  . Secondary hyperparathyroidism of renal origin ([place])   . Wears glasses  Past Surgical History:  Procedure Laterality Date  . AV FISTULA PLACEMENT Left 03/15/2019   Procedure: ARTERIOVENOUS (AV) FISTULA CREATION LEFT ARM;  Surgeon: Marty Heck, MD;  Location: [place];  Service: Vascular;  Laterality: Left;  . COLONOSCOPY    . CYSTOSCOPY W/ URETERAL STENT PLACEMENT Bilateral 12/08/2017   Procedure: CYSTOSCOPY WITH RETROGRADE PYELOGRAM/URETERAL STENT PLACEMENT;  Surgeon: Alexis Frock, MD;  Location: [facility];  Service: Urology;  Laterality: Bilateral;  . CYSTOSCOPY W/ URETERAL STENT PLACEMENT Bilateral 04/20/2018   Procedure: CYSTOSCOPY WITH STENT REPLACEMENT;  Surgeon: Alexis Frock, MD;  Location: [facility];  Service: Urology;   Laterality: Bilateral;  . CYSTOSCOPY W/ URETERAL STENT PLACEMENT Bilateral 08/10/2018   Procedure: CYSTOSCOPY WITH STENT REPLACEMENT, BILATERAL RETROGRADES;  Surgeon: Alexis Frock, MD;  Location: [facility];  Service: Urology;  Laterality: Bilateral;  45 MINS  . CYSTOSCOPY W/ URETERAL STENT PLACEMENT Bilateral 01/18/2019   Procedure: CYSTOSCOPY WITH RETROGRADE PYELOGRAM/URETERAL STENT PLACEMENT;  Surgeon: Alexis Frock, MD;  Location: [facility];  Service: Urology;  Laterality: Bilateral;  . CYSTOSCOPY W/ URETERAL STENT PLACEMENT Bilateral 05/24/2019   Procedure: CYSTOSCOPY WITH RETROGRADE PYELOGRAM/BILATERAL URETERAL STENT EXCHANGE;  Surgeon: Alexis Frock, MD;  Location: [facility];  Service: Urology;  Laterality: Bilateral;  . CYSTOSCOPY W/ URETERAL STENT PLACEMENT Bilateral 09/06/2019   Procedure: CYSTOSCOPY WITH RETROGRADE PYELOGRAM/URETERAL STENT EXCHANGE;  Surgeon: Alexis Frock, MD;  Location: [facility];  Service: Urology;  Laterality: Bilateral;  45 MINS  . CYSTOSCOPY W/ URETERAL STENT PLACEMENT Bilateral 05/08/2020   Procedure: CYSTOSCOPY WITH RETROGRADE PYELOGRAM/URETERAL STENT REPLACEMENT;  Surgeon: Alexis Frock, MD;  Location: [facility];  Service: Urology;  Laterality: Bilateral;  . INSERTION OF DIALYSIS CATHETER Right 05/06/2019   Procedure: INSERTION OF TUNNELED  DIALYSIS CATHETER;  Surgeon: Rosetta Posner, MD;  Location: [place];  Service: Vascular;  Laterality: Right;  . IR NEPHROSTOMY PLACEMENT LEFT  11/15/2017  . IR NEPHROSTOMY PLACEMENT RIGHT  11/15/2017  . PROSTATE BIOPSY N/A 12/08/2017   Procedure: BIOPSY TRANSRECTAL ULTRASONIC PROSTATE (TUBP), PERINEAL BIOPSY;  Surgeon: Alexis Frock, MD;  Location[facility];  Service: Urology;  Laterality: N/A;  . TRANSURETHRAL RESECTION OF BLADDER TUMOR N/A 12/08/2017   Procedure: TRANSURETHRAL RESECTION OF BLADDER TUMOR (TURBT);  Surgeon: Alexis Frock, MD;   Location: [facility];  Service: Urology;  Laterality: N/A;  . TRICEPS TENDON REPAIR Bilateral 08/2005  . UPPER GASTROINTESTINAL ENDOSCOPY      Current Medications: No outpatient medications have been marked as taking for the 08/28/20 encounter (Appointment) with Freada Bergeron, MD.     Allergies:   Patient has no known allergies.   Social History   Socioeconomic History  . Marital status: Married    Spouse name: Not on file  . Number of children: 5  . Years of education: Not on file  . Highest education level: Not on file  Occupational History  . Occupation: retired  Tobacco Use  . Smoking status: Current Some Day Smoker    Packs/day: 0.50    Years: 6.00    Pack years: 3.00    Types: Cigarettes, Pipe, Cigars    Last attempt to quit: 07/28/2019    Years since quitting: 1.0  . Smokeless tobacco: Never Used  . Tobacco comment: 03-20-2020  per pt 2 cigars per week;  quit cigarettes 07-28-2019  Vaping Use  . Vaping Use: Never used  Substance and Sexual Activity  . Alcohol use: Yes    Comment:  2 drinks per weekend  . Drug use: No  . Sexual activity: Yes  Other Topics Concern  . Not on file  Social History Narrative  . Not on file   Social Determinants of Health   Financial Resource Strain: Not on file  Food Insecurity: Not on file  Transportation Needs: Not on file  Physical Activity: Not on file  Stress: Not on file  Social Connections: Not on file     Family History: The patient's ***family history includes Diabetes in his brother, sister, and sister; Heart disease in his mother; Hypertension in his mother; Prostate cancer in his maternal grandfather and maternal uncle. There is no history of Colon polyps, Esophageal cancer, Stomach cancer, or Rectal cancer.  ROS:   Please see the history of present illness.    *** All other systems reviewed and are negative.  EKGs/Labs/Other Studies Reviewed:    The following studies were reviewed today: TTE  04/27/20: IMPRESSIONS  1. Left ventricular ejection fraction, by estimation, is 45 to 50%. The  left ventricle has mildly decreased function. The left ventricle  demonstrates global hypokinesis. The left ventricular internal cavity size  was moderately dilated. There is mild  concentric left ventricular hypertrophy. Left ventricular diastolic  parameters are consistent with Grade I diastolic dysfunction (impaired  relaxation). Elevated left atrial pressure.  2. Right ventricular systolic function is normal. The right ventricular  size is normal. There is moderately elevated pulmonary artery systolic  pressure. The estimated right ventricular systolic pressure is 28.3 mmHg.  3. Left atrial size was moderately dilated.  4. Right atrial size was mildly dilated.  5. The mitral valve is normal in structure. Mild mitral valve  regurgitation. No evidence of mitral stenosis.  6. The aortic valve is normal in structure. Aortic valve regurgitation is  not visualized. No aortic stenosis is present.  7. Aortic dilatation noted. There is mild dilatation of the ascending  aorta, measuring 42 mm.  8. The inferior vena cava is dilated in size with <50% respiratory  variability, suggesting right atrial pressure of 15 mmHg.  CT abdomen/pelvis 11/2017: IMPRESSION: 1. Bilateral hydronephrosis, moderate to severe in degree, most likely caused by the irregular thickening seen along the posterior bladder walls with presumed obstruction at the bilateral UVJs. This is highly suspicious for neoplastic bladder wall thickening. Recommend urology consultation for possible cystoscopy. Percutaneous nephrostomy (or nephrostomies) may also be needed. 2. Conglomerate lymphadenopathy throughout the retroperitoneum and bilateral iliac chain regions, metastatic lymphadenopathy versus lymphoma. Consider tissue sampling. 3. Single sclerotic focus at the anterior aspects of the L3 vertebral body. This is most  likely a benign bone island given the single site, however, early osseous metastasis cannot be excluded. Would consider nonemergent MRI or nuclear medicine bone scan for further characterization.  Sleep study 06/2019: IMPRESSIONS  - Severe obstructive sleep apnea occurred during this study (AHI  = 99.8/h).  - No significant central sleep apnea occurred during this study  (CAI = 0.0/h).  - Oxygen desaturation was noted during this study (Min O2 = 64%).  Mean sat 92%.  - Time with O2 saturation 89% or less was 79.9 minutes.  - Patient snored.   LE Ultrasound 11/2017: Right: There is no evidence of superficial venous thrombosis.There is no  evidence of deep vein thrombosis in the lower extremity. However, portions  of this examination were limited- see technologist comments above. No  cystic structure found in the  popliteal fossa.  Left: There is no evidence of deep vein thrombosis  in the lower extremity.  However, portions of this examination were limited- see technologist  comments above.There is no evidence of superficial venous thrombosis. No  cystic structure found in the  popliteal fossa.   EKG:  EKG is *** ordered today.  The ekg ordered today demonstrates ***  Recent Labs: 10/05/2019: ALT 21; B Natriuretic Peptide 41.3; Magnesium 2.0; Platelets 331; TSH 2.732 05/08/2020: BUN 38; Creatinine, Ser 6.50; Hemoglobin 10.9; Potassium 3.9; Sodium 140  Recent Lipid Panel    Component Value Date/Time   CHOL 98 (L) 11/13/2017 0959   TRIG 72 11/13/2017 0959   HDL 32 (L) 11/13/2017 0959   CHOLHDL 3.1 11/13/2017 0959   CHOLHDL 2.8 09/07/2015 0001   VLDL 19 09/07/2015 0001   LDLCALC 52 11/13/2017 0959     Risk Assessment/Calculations:   {Does this patient have ATRIAL FIBRILLATION?:[id number]}   Physical Exam:    VS:  There were no vitals taken for this visit.    Wt Readings from Last 3 Encounters:  07/28/20 297 lb (134.7 kg)  05/26/20 297 lb (134.7 kg)  05/14/20 297 lb  (134.7 kg)     GEN: *** Well nourished, well developed in no acute distress HEENT: Normal NECK: No JVD; No carotid bruits LYMPHATICS: No lymphadenopathy CARDIAC: ***RRR, no murmurs, rubs, gallops RESPIRATORY:  Clear to auscultation without rales, wheezing or rhonchi  ABDOMEN: Soft, non-tender, non-distended MUSCULOSKELETAL:  No edema; No deformity  SKIN: Warm and dry NEUROLOGIC:  Alert and oriented x 3 PSYCHIATRIC:  Normal affect   ASSESSMENT:    No diagnosis found. PLAN:    In order of problems listed above:   #Chronic Combined Systolic and DiastolicHeart Failure: LVEF 45-50% with global hypokinesis, G1DD. Currently euvolemic on examination with NYHA class II symptoms.  -Volume management with HD -Continue metoprolol -No ACE/ARB/entresto/spiro due to ESRD -If needed, can add hydralazine/isordil for further afterload reduction  #Palpitations: #Suspected Aflutter: Isolated event per ED report with no further issues. CHADs-vasc 1 for hypertension although ESRD does increase risk and may merit anticoagulation if any further episodes.  -Check TTE -Start metop 25mg  XL -Continue ASA -TSH 0.83 (normal) -If recurs, will have a discussion for risk vs benefits of anticoagulation at that time; would not need bridging for any operation given low CHADs-vasc -May consider ziopatch in future to determine if any asymptomatic episodes  #Mild-to-moderateascending aortic dilation: Ascending aorta measures 75mm on TTE.  -Needs serial yearly surveillance with either CT or TTE--next 04/2021 -Aggressive blood pressure control  #HTN: -Likely needs some BP room in order to tolerate HD -Continue amlodipine 10mg   -Start metoprolol 25mg  XL  #ESRD on HD: #Metastatic prostate cancer Secondary to obstructive uropathy in the setting of metastatic prostate cancer. -Continue HD as scheduled -Follow-up with urology as scheduled    {Are you ordering a CV Procedure (e.g. stress test,  cath, DCCV, TEE, etc)?   Press F2        :[id number]}    Medication Adjustments/Labs and Tests Ordered: Current medicines are reviewed at length with the patient today.  Concerns regarding medicines are outlined above.  No orders of the defined types were placed in this encounter.  No orders of the defined types were placed in this encounter.   There are no Patient Instructions on file for this visit.   Signed, Freada Bergeron, MD  08/25/2020 1:05 PM    [place]

## 2020-08-26 ENCOUNTER — Encounter: Payer: Self-pay | Admitting: Vascular Surgery

## 2020-08-26 ENCOUNTER — Ambulatory Visit: Payer: 59 | Admitting: Vascular Surgery

## 2020-08-26 ENCOUNTER — Other Ambulatory Visit: Payer: Self-pay

## 2020-08-26 VITALS — BP 155/91 | HR 80 | Temp 97.7°F | Resp 20 | Ht 74.0 in | Wt 297.0 lb

## 2020-08-26 DIAGNOSIS — N186 End stage renal disease: Secondary | ICD-10-CM | POA: Diagnosis not present

## 2020-08-26 DIAGNOSIS — Z992 Dependence on renal dialysis: Secondary | ICD-10-CM | POA: Diagnosis not present

## 2020-08-26 NOTE — Progress Notes (Unsigned)
REASON FOR VISIT:   To evaluate for hemodialysis access.  MEDICAL ISSUES:   END-STAGE RENAL DISEASE: His left brachiocephalic fistula is pulsatile proximally and somewhat small distally.  Before abandoning this I think we should proceed with a fistulogram to see if there is any way to salvage this fistula.  Fistulogram is scheduled for 09/04/2020.  I have discussed the indications for the procedure and the potential complications and he is agreeable to proceed.  He has a functioning right IJ tunneled dialysis catheter.  He dialyzes on Tuesdays Thursdays and Saturdays.  If this fistula cannot be salvaged I think the options would be a basilic vein transposition on the left versus an AV graft if the basilic vein were not adequate.  Alternatively we could put a fistula in the right arm but given that the vein map is 60 years old I would favor getting repeat vein mapping prior to considering this.   HPI:   Devon Cook is a pleasant 60 y.o. male who was last seen in our office on 9-21 by Liana Crocker, [place].  Patient had a previous left brachiocephalic fistula in September 2020.  This was not working so in November of that year she had a catheter placed.  The patient developed a stenosis in the fistula which was addressed by Dr. Augustin Coupe.  I believe the patient was then scheduled for fistulogram but this was canceled multiple times.  Past Medical History:  Diagnosis Date  . Anemia, chronic renal failure    followed by dr Hollie Salk--- dx 05/ 2019---  treated with Iron infusions and procrit  . Bilateral hydronephrosis urologist-- dr Tresa Moore   malignant-- chronic;  treated with ureteral stents  . Chronic gastritis    07-17-2019 and duodenitis  . ED (erectile dysfunction)   . Edema of both lower extremities    08-30-2019  per pt edema has much improved since started hemodialysis  . End stage renal failure on dialysis [facility]) hemodialysis T/TH/S @ [facility]---started first week of Nov 2020    nephrologist-- [facility] secondary to obstructive uropathy secondary to prostate cancer  . GERD (gastroesophageal reflux disease)   . Gout    03-20-2020 per pt unsure when last episode but it has been yrs  . Herpes genitalis in men   . History of adenomatous polyp of colon   . History of hepatitis C 2017   dx 11/ 207-- treated with Harvoni (in epic 04/ 2019 lab result non-detectable)  . History of lower GI bleeding 11/2017  . Hypertension    followed by pcp  . Nasal congestion    03-20-2020 per pt started two day ago  . OSA (obstructive sleep apnea)    severe osa w/ nocturnal hyoxemia per study 06-19-2019,  pt given cpap ( however on 08-30-2019 per pt having issues with mask fitting and on a new one so he not using the cpap);   (03-20-2020 still not using cpap, no mask)  . Palpitations    isolated event at ED 10-05-2019 Aflutter/ Afib versus SVT ;   refer to pt's cardiologist note w/ Dr Johney Frame  . Prostate 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 [facility]) urologist-- dr Tresa Moore   dx 06/ 2019-- advanced w/ mets to bone and pelvic lymphadeopathy  . Runny nose    03-20-2020 per pt started two days ago  . S/P arteriovenous (AV) fistula creation followed by dr Carlis Abbott (vascular)   03-15-2019  by dr Carlis Abbott, left branhiocephallic  . Secondary hyperparathyroidism  of renal origin ([place])   . Wears glasses     Family History  Problem Relation Age of Onset  . Hypertension Mother   . Heart disease Mother   . Diabetes Sister   . Diabetes Brother   . Prostate cancer Maternal Grandfather   . Diabetes Sister   . Prostate cancer Maternal Uncle   . Colon polyps Neg Hx   . Esophageal cancer Neg Hx   . Stomach cancer Neg Hx   . Rectal cancer Neg Hx     SOCIAL HISTORY: Social History   Tobacco Use  . Smoking status: Current Some Day Smoker    Packs/day: 0.50    Years: 6.00    Pack years: 3.00    Types: Cigarettes, Pipe, Cigars    Last attempt to quit: 07/28/2019    Years since  quitting: 1.0  . Smokeless tobacco: Never Used  . Tobacco comment: 03-20-2020  per pt 2 cigars per week;  quit cigarettes 07-28-2019  Substance Use Topics  . Alcohol use: Yes    Comment: 2 drinks per weekend    No Known Allergies  Current Outpatient Medications  Medication Sig Dispense Refill  . acetaminophen (TYLENOL) 500 MG tablet Take 1,500 mg by mouth every 6 (six) hours as needed for moderate pain.    Marland Kitchen albuterol (VENTOLIN HFA) 108 (90 Base) MCG/ACT inhaler TAKE 2 PUFFS BY MOUTH EVERY 6 HOURS AS NEEDED FOR WHEEZE OR SHORTNESS OF BREATH (Patient taking differently: Inhale 2 puffs into the lungs every 6 (six) hours as needed for wheezing or shortness of breath.) 18 g 0  . allopurinol (ZYLOPRIM) 100 MG tablet TAKE 1 TABLET BY MOUTH ONCE DAILY 30 tablet 0  . ALPRAZolam (XANAX) 0.25 MG tablet Take 1 tablet (0.25 mg total) by mouth daily as needed for anxiety. 30 tablet 1  . amLODipine (NORVASC) 10 MG tablet Take 10 mg by mouth at bedtime.     Marland Kitchen amoxicillin-clavulanate (AUGMENTIN) 500-125 MG tablet Take 1 tablet (500 mg total) by mouth daily. Take after dialysis on Tuesday, Thursday and Saturday 10 tablet 0  . ascorbic acid (VITAMIN C) 500 MG tablet Take 1,000 mg by mouth daily.    Marland Kitchen aspirin EC 81 MG tablet Take 81 mg by mouth daily.    . cinacalcet (SENSIPAR) 90 MG tablet Take 90 mg by mouth every evening.    . diphenhydrAMINE (BENADRYL) 25 MG tablet Take 25 mg by mouth every 6 (six) hours as needed for allergies.    . ferric citrate (AURYXIA) 1 GM 210 MG(Fe) tablet Take 630 mg by mouth 3 (three) times daily with meals.    . methocarbamol (ROBAXIN) 750 MG tablet Take 750 mg by mouth every 8 (eight) hours as needed for muscle spasms.    . Methoxy PEG-Epoetin Beta (MIRCERA IJ) Mircera    . metoprolol succinate (TOPROL XL) 25 MG 24 hr tablet Take 1 tablet (25 mg total) by mouth daily. (Patient taking differently: Take 25 mg by mouth at bedtime.) 90 tablet 3  . Multiple Vitamins-Minerals  (MULTIVITAMIN WITH MINERALS) tablet Take 1 tablet by mouth daily.    . multivitamin (RENA-VIT) TABS tablet Take 1 tablet by mouth daily.    Marland Kitchen oxyCODONE-acetaminophen (PERCOCET) 5-325 MG tablet Take 1 tablet by mouth every 8 (eight) hours as needed for moderate pain. From metastatic cancer and post-operatively. 40 tablet 0  . Oxymetazoline HCl (VICKS SINEX NA) Place 1 spray into the nose daily as needed (congestion).    . pantoprazole (PROTONIX)  40 MG tablet Take 1 tablet (40 mg total) by mouth 2 (two) times daily. 112 tablet 0  . vitamin B-12 (CYANOCOBALAMIN) 1000 MCG tablet Take 1,000 mcg by mouth daily.    Gillermina Phy 40 MG capsule Take 160 mg by mouth at bedtime.    Marland Kitchen zinc gluconate 50 MG tablet Take 50 mg by mouth daily.     No current facility-administered medications for this visit.    REVIEW OF SYSTEMS:  [X]  denotes positive finding, [ ]  denotes negative finding Cardiac  Comments:  Chest pain or chest pressure:    Shortness of breath upon exertion:    Short of breath when lying flat:    Irregular heart rhythm:        Vascular    Pain in calf, thigh, or hip brought on by ambulation:    Pain in feet at night that wakes you up from your sleep:     Blood clot in your veins:    Leg swelling:         Pulmonary    Oxygen at home:    Productive cough:     Wheezing:         Neurologic    Sudden weakness in arms or legs:     Sudden numbness in arms or legs:     Sudden onset of difficulty speaking or slurred speech:    Temporary loss of vision in one eye:     Problems with dizziness:         Gastrointestinal    Blood in stool:     Vomited blood:         Genitourinary    Burning when urinating:     Blood in urine:        Psychiatric    Major depression:         Hematologic    Bleeding problems:    Problems with blood clotting too easily:        Skin    Rashes or ulcers:        Constitutional    Fever or chills:     PHYSICAL EXAM:   Vitals:   08/26/20 0844  BP:  (!) 155/91  Pulse: 80  Resp: 20  Temp: 97.7 F (36.5 C)  SpO2: 96%  Weight: 134.7 kg  Height: 6\' 2"  (1.88 m)    GENERAL: The patient is a well-nourished male, in no acute distress. The vital signs are documented above. CARDIAC: There is a regular rate and rhythm.  VASCULAR: His fistula is pulsatile proximally and then narrows down in the upper arm. He has palpable radial pulses bilaterally. PULMONARY: There is good air exchange bilaterally without wheezing or rales. MUSCULOSKELETAL: There are no major deformities or cyanosis. NEUROLOGIC: No focal weakness or paresthesias are detected. SKIN: There are no ulcers or rashes noted. PSYCHIATRIC: The patient has a normal affect.  DATA:    DUPLEX OF AV FISTULA: I reviewed the duplex of the AV fistula that was done on 9-21.  The diameters of the fistula ranged from 1.04 in the distal upper arm 2.57 in the proximal upper arm.  There was some elevated velocities at the shoulder.  The depth in the mid upper arm and distal upper arm was 0.33-0.36 cm.  VEIN MAP: His most recent vein map was on 02/19/2019.  At that time the basilic vein had diameters ranging from 0.34-0.4 cm.  Thus he might be a candidate for a basilic vein transposition on the left although  the vein is somewhat marginal at the antecubital level.  Based on his vein map at that time he look like he would be a candidate for a right brachiocephalic fistula.  Deitra Mayo [facility] [phone]

## 2020-08-26 NOTE — H&P (View-Only) (Signed)
REASON FOR VISIT:   To evaluate for hemodialysis access.  MEDICAL ISSUES:   END-STAGE RENAL DISEASE: His left brachiocephalic fistula is pulsatile proximally and somewhat small distally.  Before abandoning this I think we should proceed with a fistulogram to see if there is any way to salvage this fistula.  Fistulogram is scheduled for 09/04/2020.  I have discussed the indications for the procedure and the potential complications and he is agreeable to proceed.  He has a functioning right IJ tunneled dialysis catheter.  He dialyzes on Tuesdays Thursdays and Saturdays.  If this fistula cannot be salvaged I think the options would be a basilic vein transposition on the left versus an AV graft if the basilic vein were not adequate.  Alternatively we could put a fistula in the right arm but given that the vein map is 60 years old I would favor getting repeat vein mapping prior to considering this.   HPI:   Devon Cook is a pleasant 60 y.o. male who was last seen in our office on 9-21 by Liana Crocker, [place].  Patient had a previous left brachiocephalic fistula in September 2020.  This was not working so in November of that year she had a catheter placed.  The patient developed a stenosis in the fistula which was addressed by Dr. Augustin Coupe.  I believe the patient was then scheduled for fistulogram but this was canceled multiple times.  Past Medical History:  Diagnosis Date  . Anemia, chronic renal failure    followed by dr Hollie Salk--- dx 05/ 2019---  treated with Iron infusions and procrit  . Bilateral hydronephrosis urologist-- dr Tresa Moore   malignant-- chronic;  treated with ureteral stents  . Chronic gastritis    07-17-2019 and duodenitis  . ED (erectile dysfunction)   . Edema of both lower extremities    08-30-2019  per pt edema has much improved since started hemodialysis  . 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 GERD (gastroesophageal reflux disease)   . Gout    03-20-2020 per pt unsure when last episode but it has been yrs  . Herpes genitalis in men   . History of adenomatous polyp of colon   . History of hepatitis C 2017   dx 11/ 207-- treated with Harvoni (in epic 04/ 2019 lab result non-detectable)  . History of lower GI bleeding 11/2017  . Hypertension    followed by pcp  . Nasal congestion    03-20-2020 per pt started two day ago  . OSA (obstructive sleep apnea)    severe osa w/ nocturnal hyoxemia per study 06-19-2019,  pt given cpap ( however on 08-30-2019 per pt having issues with mask fitting and on a new one so he not using the cpap);   (03-20-2020 still not using cpap, no mask)  . Palpitations    isolated event at ED 10-05-2019 Aflutter/ Afib versus SVT ;   refer to pt's cardiologist note w/ Dr Johney Frame  . 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 Runny nose    03-20-2020 per pt started two days ago  . S/P arteriovenous (AV) fistula creation followed by dr Carlis Abbott (vascular)   03-15-2019  by dr Carlis Abbott, left branhiocephallic  . Secondary hyperparathyroidism  of renal origin ([place])   . Wears glasses     Family History  Problem Relation Age of Onset  . Hypertension Mother   . Heart disease Mother   . Diabetes Sister   . Diabetes Brother   . Prostate cancer Maternal Grandfather   . Diabetes Sister   . Prostate cancer Maternal Uncle   . Colon polyps Neg Hx   . Esophageal cancer Neg Hx   . Stomach cancer Neg Hx   . Rectal cancer Neg Hx     SOCIAL HISTORY: Social History   Tobacco Use  . Smoking status: Current Some Day Smoker    Packs/day: 0.50    Years: 6.00    Pack years: 3.00    Types: Cigarettes, Pipe, Cigars    Last attempt to quit: 07/28/2019    Years since  quitting: 1.0  . Smokeless tobacco: Never Used  . Tobacco comment: 03-20-2020  per pt 2 cigars per week;  quit cigarettes 07-28-2019  Substance Use Topics  . Alcohol use: Yes    Comment: 2 drinks per weekend    No Known Allergies  Current Outpatient Medications  Medication Sig Dispense Refill  . acetaminophen (TYLENOL) 500 MG tablet Take 1,500 mg by mouth every 6 (six) hours as needed for moderate pain.    Marland Kitchen albuterol (VENTOLIN HFA) 108 (90 Base) MCG/ACT inhaler TAKE 2 PUFFS BY MOUTH EVERY 6 HOURS AS NEEDED FOR WHEEZE OR SHORTNESS OF BREATH (Patient taking differently: Inhale 2 puffs into the lungs every 6 (six) hours as needed for wheezing or shortness of breath.) 18 g 0  . allopurinol (ZYLOPRIM) 100 MG tablet TAKE 1 TABLET BY MOUTH ONCE DAILY 30 tablet 0  . ALPRAZolam (XANAX) 0.25 MG tablet Take 1 tablet (0.25 mg total) by mouth daily as needed for anxiety. 30 tablet 1  . amLODipine (NORVASC) 10 MG tablet Take 10 mg by mouth at bedtime.     Marland Kitchen amoxicillin-clavulanate (AUGMENTIN) 500-125 MG tablet Take 1 tablet (500 mg total) by mouth daily. Take after dialysis on Tuesday, Thursday and Saturday 10 tablet 0  . ascorbic acid (VITAMIN C) 500 MG tablet Take 1,000 mg by mouth daily.    Marland Kitchen aspirin EC 81 MG tablet Take 81 mg by mouth daily.    . cinacalcet (SENSIPAR) 90 MG tablet Take 90 mg by mouth every evening.    . diphenhydrAMINE (BENADRYL) 25 MG tablet Take 25 mg by mouth every 6 (six) hours as needed for allergies.    . ferric citrate (AURYXIA) 1 GM 210 MG(Fe) tablet Take 630 mg by mouth 3 (three) times daily with meals.    . methocarbamol (ROBAXIN) 750 MG tablet Take 750 mg by mouth every 8 (eight) hours as needed for muscle spasms.    . Methoxy PEG-Epoetin Beta (MIRCERA IJ) Mircera    . metoprolol succinate (TOPROL XL) 25 MG 24 hr tablet Take 1 tablet (25 mg total) by mouth daily. (Patient taking differently: Take 25 mg by mouth at bedtime.) 90 tablet 3  . Multiple Vitamins-Minerals  (MULTIVITAMIN WITH MINERALS) tablet Take 1 tablet by mouth daily.    . multivitamin (RENA-VIT) TABS tablet Take 1 tablet by mouth daily.    Marland Kitchen oxyCODONE-acetaminophen (PERCOCET) 5-325 MG tablet Take 1 tablet by mouth every 8 (eight) hours as needed for moderate pain. From metastatic cancer and post-operatively. 40 tablet 0  . Oxymetazoline HCl (VICKS SINEX NA) Place 1 spray into the nose daily as needed (congestion).    . pantoprazole (PROTONIX)  40 MG tablet Take 1 tablet (40 mg total) by mouth 2 (two) times daily. 112 tablet 0  . vitamin B-12 (CYANOCOBALAMIN) 1000 MCG tablet Take 1,000 mcg by mouth daily.    Gillermina Phy 40 MG capsule Take 160 mg by mouth at bedtime.    Marland Kitchen zinc gluconate 50 MG tablet Take 50 mg by mouth daily.     No current facility-administered medications for this visit.    REVIEW OF SYSTEMS:  [X]  denotes positive finding, [ ]  denotes negative finding Cardiac  Comments:  Chest pain or chest pressure:    Shortness of breath upon exertion:    Short of breath when lying flat:    Irregular heart rhythm:        Vascular    Pain in calf, thigh, or hip brought on by ambulation:    Pain in feet at night that wakes you up from your sleep:     Blood clot in your veins:    Leg swelling:         Pulmonary    Oxygen at home:    Productive cough:     Wheezing:         Neurologic    Sudden weakness in arms or legs:     Sudden numbness in arms or legs:     Sudden onset of difficulty speaking or slurred speech:    Temporary loss of vision in one eye:     Problems with dizziness:         Gastrointestinal    Blood in stool:     Vomited blood:         Genitourinary    Burning when urinating:     Blood in urine:        Psychiatric    Major depression:         Hematologic    Bleeding problems:    Problems with blood clotting too easily:        Skin    Rashes or ulcers:        Constitutional    Fever or chills:     PHYSICAL EXAM:   Vitals:   08/26/20 0844  BP:  (!) 155/91  Pulse: 80  Resp: 20  Temp: 97.7 F (36.5 C)  SpO2: 96%  Weight: 134.7 kg  Height: 6\' 2"  (1.88 m)    GENERAL: The patient is a well-nourished male, in no acute distress. The vital signs are documented above. CARDIAC: There is a regular rate and rhythm.  VASCULAR: His fistula is pulsatile proximally and then narrows down in the upper arm. He has palpable radial pulses bilaterally. PULMONARY: There is good air exchange bilaterally without wheezing or rales. MUSCULOSKELETAL: There are no major deformities or cyanosis. NEUROLOGIC: No focal weakness or paresthesias are detected. SKIN: There are no ulcers or rashes noted. PSYCHIATRIC: The patient has a normal affect.  DATA:    DUPLEX OF AV FISTULA: I reviewed the duplex of the AV fistula that was done on 9-21.  The diameters of the fistula ranged from 1.04 in the distal upper arm 2.57 in the proximal upper arm.  There was some elevated velocities at the shoulder.  The depth in the mid upper arm and distal upper arm was 0.33-0.36 cm.  VEIN MAP: His most recent vein map was on 02/19/2019.  At that time the basilic vein had diameters ranging from 0.34-0.4 cm.  Thus he might be a candidate for a basilic vein transposition on the left although  the vein is somewhat marginal at the antecubital level.  Based on his vein map at that time he look like he would be a candidate for a right brachiocephalic fistula.  Deitra Mayo [facility] [phone]

## 2020-08-28 ENCOUNTER — Ambulatory Visit: Payer: 59 | Admitting: Cardiology

## 2020-09-03 ENCOUNTER — Other Ambulatory Visit (HOSPITAL_COMMUNITY)
Admission: RE | Admit: 2020-09-03 | Discharge: 2020-09-03 | Disposition: A | Discharge status: Home / Self Care | Payer: 59 | Source: Ambulatory Visit | Attending: Vascular Surgery | Admitting: Vascular Surgery

## 2020-09-03 DIAGNOSIS — Z20822 Contact with and (suspected) exposure to covid-19: Secondary | ICD-10-CM | POA: Insufficient documentation

## 2020-09-03 DIAGNOSIS — Z01812 Encounter for preprocedural laboratory examination: Secondary | ICD-10-CM | POA: Insufficient documentation

## 2020-09-03 LAB — SARS CORONAVIRUS 2 (TAT 6-24 HRS): SARS Coronavirus 2: NEGATIVE

## 2020-09-03 NOTE — Progress Notes (Signed)
Patient presented today to the drive-thru COVID testing site for pre-procedural testing. According to the patient, he tested positive for COVID "last month" on a home test and then at a physician's office. There was no positive test result on record and the patient could not produce documentation of the positive test. He was seen by Dr. Redmond School from [place] for a video visit with a primary diagnosis of COVID-19 on July 28, 2020. Patient was tested per standing order.

## 2020-09-04 ENCOUNTER — Encounter (HOSPITAL_COMMUNITY): Payer: Self-pay | Admitting: Vascular Surgery

## 2020-09-04 ENCOUNTER — Ambulatory Visit (HOSPITAL_COMMUNITY)
Admission: RE | Admit: 2020-09-04 | Discharge: 2020-09-04 | Disposition: A | Discharge status: Home / Self Care | Payer: 59 | Attending: Vascular Surgery | Admitting: Vascular Surgery

## 2020-09-04 ENCOUNTER — Other Ambulatory Visit: Payer: Self-pay

## 2020-09-04 ENCOUNTER — Encounter (HOSPITAL_COMMUNITY)
Admission: RE | Disposition: A | Discharge status: Home / Self Care | Payer: Self-pay | Source: Home / Self Care | Attending: Vascular Surgery

## 2020-09-04 DIAGNOSIS — I12 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 stage renal disease: Secondary | ICD-10-CM | POA: Insufficient documentation

## 2020-09-04 DIAGNOSIS — Z8546 Personal history of malignant neoplasm of prostate: Secondary | ICD-10-CM | POA: Diagnosis not present

## 2020-09-04 DIAGNOSIS — Z8042 Family history of malignant neoplasm of prostate: Secondary | ICD-10-CM | POA: Insufficient documentation

## 2020-09-04 DIAGNOSIS — Z79899 Other long term (current) drug therapy: Secondary | ICD-10-CM | POA: Diagnosis not present

## 2020-09-04 DIAGNOSIS — Z8249 Family history of ischemic heart disease and other diseases of the circulatory system: Secondary | ICD-10-CM | POA: Insufficient documentation

## 2020-09-04 DIAGNOSIS — T82898A Other specified complication of vascular prosthetic devices, implants and grafts, initial encounter: Secondary | ICD-10-CM | POA: Diagnosis not present

## 2020-09-04 DIAGNOSIS — G4733 Obstructive sleep apnea (adult) (pediatric): Secondary | ICD-10-CM | POA: Diagnosis not present

## 2020-09-04 DIAGNOSIS — T82858A Stenosis of vascular prosthetic devices, implants and grafts, initial encounter: Secondary | ICD-10-CM | POA: Insufficient documentation

## 2020-09-04 DIAGNOSIS — Z992 Dependence on renal dialysis: Secondary | ICD-10-CM

## 2020-09-04 DIAGNOSIS — N186 End stage renal disease: Secondary | ICD-10-CM | POA: Diagnosis not present

## 2020-09-04 DIAGNOSIS — F17210 Nicotine dependence, cigarettes, uncomplicated: Secondary | ICD-10-CM | POA: Insufficient documentation

## 2020-09-04 DIAGNOSIS — Z85830 Personal history of malignant neoplasm of bone: Secondary | ICD-10-CM | POA: Diagnosis not present

## 2020-09-04 DIAGNOSIS — N179 Acute kidney failure, unspecified: Secondary | ICD-10-CM | POA: Diagnosis not present

## 2020-09-04 DIAGNOSIS — Y832 Surgical operation with anastomosis, bypass or graft as the cause of abnormal reaction of the patient, or of later complication, without mention of misadventure at the time of the procedure: Secondary | ICD-10-CM | POA: Diagnosis not present

## 2020-09-04 DIAGNOSIS — N2581 Secondary hyperparathyroidism of renal origin: Secondary | ICD-10-CM | POA: Insufficient documentation

## 2020-09-04 DIAGNOSIS — Z7982 Long term (current) use of aspirin: Secondary | ICD-10-CM | POA: Insufficient documentation

## 2020-09-04 DIAGNOSIS — Z8619 Personal history of other infectious and parasitic diseases: Secondary | ICD-10-CM | POA: Insufficient documentation

## 2020-09-04 HISTORY — PX: A/V FISTULAGRAM: CATH118298

## 2020-09-04 HISTORY — PX: PERIPHERAL VASCULAR INTERVENTION: CATH118257

## 2020-09-04 LAB — POCT I-STAT, CHEM 8
BUN: 37 mg/dL — ABNORMAL HIGH (ref 6–20)
Calcium, Ion: 1.17 mmol/L (ref 1.15–1.40)
Chloride: 105 mmol/L (ref 98–111)
Creatinine, Ser: 7.4 mg/dL — ABNORMAL HIGH (ref 0.61–1.24)
Glucose, Bld: 100 mg/dL — ABNORMAL HIGH (ref 70–99)
HCT: 29 % — ABNORMAL LOW (ref 39.0–52.0)
Hemoglobin: 9.9 g/dL — ABNORMAL LOW (ref 13.0–17.0)
Potassium: 4.2 mmol/L (ref 3.5–5.1)
Sodium: 142 mmol/L (ref 135–145)
TCO2: 26 mmol/L (ref 22–32)

## 2020-09-04 SURGERY — A/V FISTULAGRAM
Anesthesia: LOCAL | Laterality: Left

## 2020-09-04 MED ORDER — IODIXANOL 320 MG/ML IV SOLN
INTRAVENOUS | Status: DC | PRN
  Administered 2020-09-04: 35 mL

## 2020-09-04 MED ORDER — HEPARIN SODIUM (PORCINE) 1000 UNIT/ML IJ SOLN
INTRAMUSCULAR | Status: DC | PRN
  Administered 2020-09-04: 5000 [IU] via INTRAVENOUS

## 2020-09-04 MED ORDER — HEPARIN (PORCINE) IN NACL 1000-0.9 UT/500ML-% IV SOLN
INTRAVENOUS | Status: AC
  Filled 2020-09-04: qty 500

## 2020-09-04 MED ORDER — MIDAZOLAM HCL 2 MG/2ML IJ SOLN
INTRAMUSCULAR | Status: DC | PRN
  Administered 2020-09-04: 1 mg via INTRAVENOUS

## 2020-09-04 MED ORDER — LIDOCAINE HCL (PF) 1 % IJ SOLN
INTRAMUSCULAR | Status: DC | PRN
  Administered 2020-09-04: 2 mL via INTRADERMAL

## 2020-09-04 MED ORDER — SODIUM CHLORIDE 0.9% FLUSH: 3.0000 mL | Freq: Two times a day (BID) | INTRAVENOUS | Status: DC

## 2020-09-04 MED ORDER — SODIUM CHLORIDE 0.9% FLUSH: 3.0000 mL | INTRAVENOUS | Status: DC | PRN

## 2020-09-04 MED ORDER — MIDAZOLAM HCL 5 MG/5ML IJ SOLN
INTRAMUSCULAR | Status: AC
  Filled 2020-09-04: qty 5

## 2020-09-04 MED ORDER — HEPARIN SODIUM (PORCINE) 1000 UNIT/ML IJ SOLN
INTRAMUSCULAR | Status: AC
  Filled 2020-09-04: qty 1

## 2020-09-04 MED ORDER — LIDOCAINE HCL (PF) 1 % IJ SOLN
INTRAMUSCULAR | Status: AC
  Filled 2020-09-04: qty 30

## 2020-09-04 MED ORDER — FENTANYL CITRATE (PF) 100 MCG/2ML IJ SOLN
INTRAMUSCULAR | Status: DC | PRN
  Administered 2020-09-04: 50 ug via INTRAVENOUS

## 2020-09-04 MED ORDER — HEPARIN (PORCINE) IN NACL 1000-0.9 UT/500ML-% IV SOLN
INTRAVENOUS | Status: DC | PRN
  Administered 2020-09-04: 500 mL

## 2020-09-04 MED ORDER — FENTANYL CITRATE (PF) 100 MCG/2ML IJ SOLN
INTRAMUSCULAR | Status: AC
  Filled 2020-09-04: qty 2

## 2020-09-04 MED ORDER — SODIUM CHLORIDE 0.9 % IV SOLN: 250.0000 mL | INTRAVENOUS | Status: DC | PRN

## 2020-09-04 SURGICAL SUPPLY — 16 items
BALLN LUTONIX AV 8X60X75 (BALLOONS) ×2
BALLN MUSTANG 8X60X75 (BALLOONS) ×2
BALLOON LUTONIX AV 8X60X75 (BALLOONS) ×1 IMPLANT
BALLOON MUSTANG 8X60X75 (BALLOONS) ×1 IMPLANT
COVER DOME SNAP 22 D (MISCELLANEOUS) ×2 IMPLANT
KIT ENCORE 26 ADVANTAGE (KITS) ×2 IMPLANT
PACK EP LATEX FREE (CUSTOM PROCEDURE TRAY) ×2
PACK EP LF (CUSTOM PROCEDURE TRAY) ×1 IMPLANT
PROTECTION STATION PRESSURIZED (MISCELLANEOUS) ×2
SHEATH PINNACLE R/O II 6F 4CM (SHEATH) ×2 IMPLANT
SHEATH PROBE COVER 6X72 (BAG) ×2 IMPLANT
STATION PROTECTION PRESSURIZED (MISCELLANEOUS) ×1 IMPLANT
STOPCOCK MORSE 400PSI 3WAY (MISCELLANEOUS) ×2 IMPLANT
TRAY PV CATH (CUSTOM PROCEDURE TRAY) ×2 IMPLANT
TUBING CIL FLEX 10 FLL-RA (TUBING) ×2 IMPLANT
WIRE STARTER BENTSON 035X150 (WIRE) ×2 IMPLANT

## 2020-09-04 NOTE — Op Note (Signed)
   PATIENT: Devon Cook      MRN: [id number] DOB: 1960/11/24    DATE OF PROCEDURE: 09/04/2020  INDICATIONS:    WILKES POTVIN is a 61 y.o. male with a poorly functioning left brachiocephalic fistula.  He presents for a fistulogram  PROCEDURE:    1.  Conscious sedation 2.  Ultrasound-guided access to left brachiocephalic fistula 3.  Fistulogram left brachiocephalic fistula 4.  Drug-coated balloon angioplasty left brachiocephalic fistula  SURGEON: Judeth Cornfield. Scot Dock, MD, FACS  ANESTHESIA: Local with sedation  EBL: Minimal  TECHNIQUE: The patient was brought to the peripheral vascular lab and was sedated. The period of conscious sedation was 15 minutes.  During that time period, I was present face-to-face 100% of the time.  The patient was administered 1 mg of Versed and 50 mcg of fentanyl. The patient's heart rate, blood pressure, and oxygen saturation were monitored by the nurse continuously during the procedure.  The left arm was prepped and draped in usual sterile fashion.  Under ultrasound guidance, after the skin was anesthetized, I cannulated the proximal fistula with a micropuncture needle and a micropuncture sheath was introduced over the wire.  A fistulogram was then obtained to evaluate the fistula from the point of cannulation to include the central veins.  There was an area of narrowing in the central portion of the fistula which I elected to address with angioplasty.  The micropuncture sheath was exchanged for a short 6 [place] sheath over a Bentson wire.  The patient received 4000 units of IV heparin.  I initially selected an 8 mm x 6 cm Mustang balloon which was inflated to 20 atm for 1 minute.  There was a good result and the thrill had improved.  I then went back with an 8 mm x 6 cm loop tonics drug-coated balloon and this was inflated to 6 atm for 2-1/2 minutes.  Completion film showed improvement in the stenosis in the central portion of the fistula and there was an  improved thrill.  The stenosis went from approximately 70% to 30%.   A 4-0 Monocryl suture was placed around the cannulation site and there was good hemostasis.   FINDINGS:   1.  Successful drug-coated balloon angioplasty of a stenosis in the central portion of the brachiocephalic fistula as described above.  There is a residual 30% stenosis. 2.  The fistula is otherwise widely patent and there is no central venous stenosis and no problem at the arterial anastomosis  CLINICAL NOTE: They can attempt to use the fistula in 1 week.  Until then they will use his catheter.  Deitra Mayo, MD, FACS [facility] DICTATION:   09/04/2020

## 2020-09-04 NOTE — Discharge Instructions (Signed)
Dialysis Fistulogram, Care After The following information offers guidance on how to care for yourself after your procedure. Your health care provider may also give you more specific instructions. If you have problems or questions, contact your health care provider. What can I expect after the procedure? After the procedure, it is common to have:  A small amount of discomfort in the area where the small tube (catheter) was placed for the procedure.  A small amount of bruising around the fistula.  Sleepiness and tiredness (fatigue). Follow these instructions at home: Puncture site care  Follow instructions from your health care provider about how to take care of the site where catheters were inserted. Make sure you: ? Wash your hands with soap and water for at least 20 seconds before and after you change your bandage (dressing). If soap and water are not available, use hand sanitizer. ? Change your dressing as told by your health care provider. ? Leave stitches (sutures), skin glue, or adhesive strips in place. These skin closures may need to stay in place for 2 weeks or longer. If adhesive strip edges start to loosen and curl up, you may trim the loose edges. Do not remove adhesive strips completely unless your health care provider tells you to do that.  Check your puncture area every day for signs of infection. Check for: ? More redness, swelling, or pain. ? Fluid or blood. ? Warmth. ? Pus or a bad smell.   Activity  Rest as much as you can.  If you were given a sedative during the procedure, it can affect you for several hours. Do not drive or operate machinery until your health care provider says that it is safe.  Do not lift anything that is heavier than 5 lb (2.3 kg), or the limit that you are told, on the day of your procedure.  Do not do anything strenuous with your arm for the rest of the day. Avoid household activities, such as vacuuming.  Return to your normal activities as  told by your health care provider. Ask your health care provider what activities are safe for you. Safety To prevent damage to your graft or fistula:  Do not wear tight-fitting clothing or jewelry on the arm or leg that has your graft or fistula.  Tell all your health care providers that you have a dialysis fistula or graft.  Do not allow blood draws, IVs, or blood pressure readings to be done in the arm that has your fistula or graft.  Do not allow flu shots or vaccinations in the arm with your fistula or graft. General instructions  Take over-the-counter and prescription medicines only as told by your health care provider.  Do not take baths, swim, or use a hot tub until your health care provider approves. Ask your health care provider if you may take showers. You may only be allowed to take sponge baths.  Monitor your dialysis fistula closely. Check to make sure that you can feel a vibration or buzz (a thrill) when you put your fingers over the fistula.  Keep all follow-up visits. This is important. Contact a health care provider if:  You have more redness, swelling, or pain at the site where the catheter was put in.  You have fluid or blood coming from the catheter site.  You have pus or a bad smell coming from the catheter site.  Your catheter site feels warm.  You have a fever or chills. Get help right away if:    You have bleeding from the vascular access site that does not stop.  You feel weak.  You have trouble balancing.  You have trouble moving your arms or legs.  You have problems with your speech or vision.  You can no longer feel a vibration or buzz when you put your fingers over your fistula.  The limb that was used for the procedure swells or becomes painful, cold, blue, or pale white.  You have chest pain or shortness of breath. These symptoms may represent a serious problem that is an emergency. Do not wait to see if the symptoms will go away. Get  medical help right away. Call your local emergency services (911 in the [place] Do not drive yourself to the hospital. Summary  After a dialysis fistulogram, it is common to have a small amount of discomfort or bruising in the area where the small, thin tube (catheter) was placed.  Rest as much as you can after your procedure. Return to your normal activities as told by your health care provider.  Take over-the-counter and prescription medicines only as told by your health care provider.  Follow instructions from your health care provider about how to take care of the site where the catheter was inserted.  Keep all follow-up visits. This is important. This information is not intended to replace advice given to you by your health care provider. Make sure you discuss any questions you have with your health care provider. Document Revised: 01/29/2020 Document Reviewed: 01/29/2020 Elsevier Patient Education  2021 Elsevier Inc.  

## 2020-09-04 NOTE — Interval H&P Note (Signed)
History and Physical Interval Note:  09/04/2020 7:28 AM  Devon Cook  has presented today for surgery, with the diagnosis of end stage renal.  The various methods of treatment have been discussed with the patient and family. After consideration of risks, benefits and other options for treatment, the patient has consented to  Procedure(s): A/V FISTULAGRAM (Left) as a surgical intervention.  The patient's history has been reviewed, patient examined, no change in status, stable for surgery.  I have reviewed the patient's chart and labs.  Questions were answered to the patient's satisfaction.     Deitra Mayo

## 2020-09-08 ENCOUNTER — Encounter (HOSPITAL_COMMUNITY): Payer: Self-pay | Admitting: Vascular Surgery

## 2021-01-13 ENCOUNTER — Ambulatory Visit: Payer: 59

## 2021-02-11 ENCOUNTER — Encounter (HOSPITAL_COMMUNITY): Payer: Self-pay

## 2021-02-18 ENCOUNTER — Encounter: Payer: Self-pay | Admitting: Family Medicine

## 2021-04-02 ENCOUNTER — Other Ambulatory Visit: Payer: Self-pay

## 2021-04-02 ENCOUNTER — Ambulatory Visit (INDEPENDENT_AMBULATORY_CARE_PROVIDER_SITE_OTHER): Payer: Medicare Other | Admitting: Family Medicine

## 2021-04-02 VITALS — BP 164/96 | HR 74 | Temp 96.0°F | Wt 279.2 lb

## 2021-04-02 DIAGNOSIS — F4329 Adjustment disorder with other symptoms: Secondary | ICD-10-CM | POA: Diagnosis not present

## 2021-04-02 DIAGNOSIS — Z23 Encounter for immunization: Secondary | ICD-10-CM | POA: Diagnosis not present

## 2021-04-02 DIAGNOSIS — J301 Allergic rhinitis due to pollen: Secondary | ICD-10-CM

## 2021-04-02 DIAGNOSIS — R062 Wheezing: Secondary | ICD-10-CM

## 2021-04-02 DIAGNOSIS — N186 End stage renal disease: Secondary | ICD-10-CM | POA: Diagnosis not present

## 2021-04-02 DIAGNOSIS — Z992 Dependence on renal dialysis: Secondary | ICD-10-CM

## 2021-04-02 DIAGNOSIS — I10 Essential (primary) hypertension: Secondary | ICD-10-CM

## 2021-04-02 DIAGNOSIS — R110 Nausea: Secondary | ICD-10-CM

## 2021-04-02 MED ORDER — ALPRAZOLAM 0.25 MG PO TABS: 0.2500 mg | ORAL_TABLET | Freq: Every day | ORAL | 1 refills | Status: DC | PRN

## 2021-04-02 NOTE — Patient Instructions (Signed)
Try Emetrol for your nausea but do not take it at the same time to take the pantoprazole

## 2021-04-02 NOTE — Progress Notes (Signed)
   Subjective:    Patient ID: Devon Cook, male    DOB: Oct 18, 1960, 60 y.o.   MRN: [id number]  HPI He is being taken care of at the dialysis unit.  They recently gave him clonidine and increase his losartan.  He also has been having difficulty with nausea recently usually in the morning.  Presently he is taking pantoprazole twice per day.  He also is having difficulty with his asthma and allergies.  He does have some difficulty with sneezing, postnasal draining, itchy watery eyes, rhinorrhea and does state that he is slightly short of breath.  He has been using his albuterol.  Recently started on Zyrtec which has helped.  He has also had difficulty with sleep stating that he tries to fall asleep and his mind starts to race.   Review of Systems     Objective:   Physical Exam Alert and in no distress.  Blood pressure is recorded.  Cardiac exam shows regular rhythm without murmurs or gallops.  Lungs are clear to auscultation.       Assessment & Plan:  Wheezing  Stress and adjustment reaction - Plan: ALPRAZolam (XANAX) 0.25 MG tablet  Need for influenza vaccination - Plan: Flu Vaccine QUAD 71mo+IM (Fluarix, Fluzone & Alfiuria Quad PF)  Immunization, viral disease - Plan: Pension scheme manager  ESRD on dialysis ([place])  Seasonal allergic rhinitis due to pollen  Primary hypertension  Nausea He does have albuterol and I recommend that he go ahead and use that.  He is also to continue on his Zyrtec which seems to be helping. Explained that I would let renal in charge of his blood pressure.  If need be I can certainly help with that. I then discussed ways to help with his racing mind at night.  Encouraged him to use the Xanax not at night but during the day if he has difficulty with anxiety.  In the past he has not overly used that. Recommend he continue on the pantoprazole but try Emetrol not at the same time of using the pantoprazole to see if that would be quite his  stomach down.  He will keep me informed.

## 2021-04-03 DIAGNOSIS — N138 Other obstructive and reflux uropathy: Secondary | ICD-10-CM | POA: Diagnosis not present

## 2021-04-03 DIAGNOSIS — N186 End stage renal disease: Secondary | ICD-10-CM | POA: Diagnosis not present

## 2021-04-03 DIAGNOSIS — Z992 Dependence on renal dialysis: Secondary | ICD-10-CM | POA: Diagnosis not present

## 2021-04-05 DIAGNOSIS — D631 Anemia in chronic kidney disease: Secondary | ICD-10-CM | POA: Diagnosis not present

## 2021-04-05 DIAGNOSIS — N186 End stage renal disease: Secondary | ICD-10-CM | POA: Diagnosis not present

## 2021-04-05 DIAGNOSIS — N2581 Secondary hyperparathyroidism of renal origin: Secondary | ICD-10-CM | POA: Diagnosis not present

## 2021-04-05 DIAGNOSIS — R197 Diarrhea, unspecified: Secondary | ICD-10-CM | POA: Diagnosis not present

## 2021-04-05 DIAGNOSIS — D689 Coagulation defect, unspecified: Secondary | ICD-10-CM | POA: Diagnosis not present

## 2021-04-05 DIAGNOSIS — R52 Pain, unspecified: Secondary | ICD-10-CM | POA: Diagnosis not present

## 2021-04-05 DIAGNOSIS — Z992 Dependence on renal dialysis: Secondary | ICD-10-CM | POA: Diagnosis not present

## 2021-04-05 DIAGNOSIS — Z23 Encounter for immunization: Secondary | ICD-10-CM | POA: Diagnosis not present

## 2021-04-05 DIAGNOSIS — D509 Iron deficiency anemia, unspecified: Secondary | ICD-10-CM | POA: Diagnosis not present

## 2021-04-05 DIAGNOSIS — T7840XA Allergy, unspecified, initial encounter: Secondary | ICD-10-CM | POA: Diagnosis not present

## 2021-04-07 DIAGNOSIS — N2581 Secondary hyperparathyroidism of renal origin: Secondary | ICD-10-CM | POA: Diagnosis not present

## 2021-04-07 DIAGNOSIS — R197 Diarrhea, unspecified: Secondary | ICD-10-CM | POA: Diagnosis not present

## 2021-04-07 DIAGNOSIS — R52 Pain, unspecified: Secondary | ICD-10-CM | POA: Diagnosis not present

## 2021-04-07 DIAGNOSIS — Z992 Dependence on renal dialysis: Secondary | ICD-10-CM | POA: Diagnosis not present

## 2021-04-07 DIAGNOSIS — D509 Iron deficiency anemia, unspecified: Secondary | ICD-10-CM | POA: Diagnosis not present

## 2021-04-07 DIAGNOSIS — D631 Anemia in chronic kidney disease: Secondary | ICD-10-CM | POA: Diagnosis not present

## 2021-04-07 DIAGNOSIS — T7840XA Allergy, unspecified, initial encounter: Secondary | ICD-10-CM | POA: Diagnosis not present

## 2021-04-07 DIAGNOSIS — N186 End stage renal disease: Secondary | ICD-10-CM | POA: Diagnosis not present

## 2021-04-07 DIAGNOSIS — Z23 Encounter for immunization: Secondary | ICD-10-CM | POA: Diagnosis not present

## 2021-04-07 DIAGNOSIS — D689 Coagulation defect, unspecified: Secondary | ICD-10-CM | POA: Diagnosis not present

## 2021-04-08 DIAGNOSIS — N186 End stage renal disease: Secondary | ICD-10-CM | POA: Diagnosis not present

## 2021-04-08 DIAGNOSIS — N130 Hydronephrosis with ureteropelvic junction obstruction: Secondary | ICD-10-CM | POA: Diagnosis not present

## 2021-04-08 DIAGNOSIS — C7951 Secondary malignant neoplasm of bone: Secondary | ICD-10-CM | POA: Diagnosis not present

## 2021-04-09 ENCOUNTER — Other Ambulatory Visit: Payer: Self-pay

## 2021-04-09 DIAGNOSIS — Z23 Encounter for immunization: Secondary | ICD-10-CM | POA: Diagnosis not present

## 2021-04-09 DIAGNOSIS — N2581 Secondary hyperparathyroidism of renal origin: Secondary | ICD-10-CM | POA: Diagnosis not present

## 2021-04-09 DIAGNOSIS — D509 Iron deficiency anemia, unspecified: Secondary | ICD-10-CM | POA: Diagnosis not present

## 2021-04-09 DIAGNOSIS — Z992 Dependence on renal dialysis: Secondary | ICD-10-CM | POA: Diagnosis not present

## 2021-04-09 DIAGNOSIS — R52 Pain, unspecified: Secondary | ICD-10-CM | POA: Diagnosis not present

## 2021-04-09 DIAGNOSIS — R197 Diarrhea, unspecified: Secondary | ICD-10-CM | POA: Diagnosis not present

## 2021-04-09 DIAGNOSIS — T7840XA Allergy, unspecified, initial encounter: Secondary | ICD-10-CM | POA: Diagnosis not present

## 2021-04-09 DIAGNOSIS — N186 End stage renal disease: Secondary | ICD-10-CM | POA: Diagnosis not present

## 2021-04-09 DIAGNOSIS — D689 Coagulation defect, unspecified: Secondary | ICD-10-CM | POA: Diagnosis not present

## 2021-04-09 DIAGNOSIS — D631 Anemia in chronic kidney disease: Secondary | ICD-10-CM | POA: Diagnosis not present

## 2021-04-09 MED ORDER — METOPROLOL SUCCINATE ER 25 MG PO TB24: 25.0000 mg | ORAL_TABLET | Freq: Every day | ORAL | 0 refills | Status: AC

## 2021-04-12 ENCOUNTER — Other Ambulatory Visit: Payer: Self-pay | Admitting: Urology

## 2021-04-12 DIAGNOSIS — Z992 Dependence on renal dialysis: Secondary | ICD-10-CM | POA: Diagnosis not present

## 2021-04-12 DIAGNOSIS — T7840XA Allergy, unspecified, initial encounter: Secondary | ICD-10-CM | POA: Diagnosis not present

## 2021-04-12 DIAGNOSIS — R52 Pain, unspecified: Secondary | ICD-10-CM | POA: Diagnosis not present

## 2021-04-12 DIAGNOSIS — R197 Diarrhea, unspecified: Secondary | ICD-10-CM | POA: Diagnosis not present

## 2021-04-12 DIAGNOSIS — D689 Coagulation defect, unspecified: Secondary | ICD-10-CM | POA: Diagnosis not present

## 2021-04-12 DIAGNOSIS — D631 Anemia in chronic kidney disease: Secondary | ICD-10-CM | POA: Diagnosis not present

## 2021-04-12 DIAGNOSIS — Z23 Encounter for immunization: Secondary | ICD-10-CM | POA: Diagnosis not present

## 2021-04-12 DIAGNOSIS — D509 Iron deficiency anemia, unspecified: Secondary | ICD-10-CM | POA: Diagnosis not present

## 2021-04-12 DIAGNOSIS — N2581 Secondary hyperparathyroidism of renal origin: Secondary | ICD-10-CM | POA: Diagnosis not present

## 2021-04-12 DIAGNOSIS — N186 End stage renal disease: Secondary | ICD-10-CM | POA: Diagnosis not present

## 2021-04-14 DIAGNOSIS — R52 Pain, unspecified: Secondary | ICD-10-CM | POA: Diagnosis not present

## 2021-04-14 DIAGNOSIS — D509 Iron deficiency anemia, unspecified: Secondary | ICD-10-CM | POA: Diagnosis not present

## 2021-04-14 DIAGNOSIS — D689 Coagulation defect, unspecified: Secondary | ICD-10-CM | POA: Diagnosis not present

## 2021-04-14 DIAGNOSIS — R197 Diarrhea, unspecified: Secondary | ICD-10-CM | POA: Diagnosis not present

## 2021-04-14 DIAGNOSIS — D631 Anemia in chronic kidney disease: Secondary | ICD-10-CM | POA: Diagnosis not present

## 2021-04-14 DIAGNOSIS — Z992 Dependence on renal dialysis: Secondary | ICD-10-CM | POA: Diagnosis not present

## 2021-04-14 DIAGNOSIS — N186 End stage renal disease: Secondary | ICD-10-CM | POA: Diagnosis not present

## 2021-04-14 DIAGNOSIS — T7840XA Allergy, unspecified, initial encounter: Secondary | ICD-10-CM | POA: Diagnosis not present

## 2021-04-14 DIAGNOSIS — Z23 Encounter for immunization: Secondary | ICD-10-CM | POA: Diagnosis not present

## 2021-04-14 DIAGNOSIS — N2581 Secondary hyperparathyroidism of renal origin: Secondary | ICD-10-CM | POA: Diagnosis not present

## 2021-04-16 DIAGNOSIS — R52 Pain, unspecified: Secondary | ICD-10-CM | POA: Diagnosis not present

## 2021-04-16 DIAGNOSIS — R197 Diarrhea, unspecified: Secondary | ICD-10-CM | POA: Diagnosis not present

## 2021-04-16 DIAGNOSIS — D509 Iron deficiency anemia, unspecified: Secondary | ICD-10-CM | POA: Diagnosis not present

## 2021-04-16 DIAGNOSIS — Z992 Dependence on renal dialysis: Secondary | ICD-10-CM | POA: Diagnosis not present

## 2021-04-16 DIAGNOSIS — Z23 Encounter for immunization: Secondary | ICD-10-CM | POA: Diagnosis not present

## 2021-04-16 DIAGNOSIS — N2581 Secondary hyperparathyroidism of renal origin: Secondary | ICD-10-CM | POA: Diagnosis not present

## 2021-04-16 DIAGNOSIS — D689 Coagulation defect, unspecified: Secondary | ICD-10-CM | POA: Diagnosis not present

## 2021-04-16 DIAGNOSIS — D631 Anemia in chronic kidney disease: Secondary | ICD-10-CM | POA: Diagnosis not present

## 2021-04-16 DIAGNOSIS — T7840XA Allergy, unspecified, initial encounter: Secondary | ICD-10-CM | POA: Diagnosis not present

## 2021-04-16 DIAGNOSIS — N186 End stage renal disease: Secondary | ICD-10-CM | POA: Diagnosis not present

## 2021-04-19 DIAGNOSIS — Z23 Encounter for immunization: Secondary | ICD-10-CM | POA: Diagnosis not present

## 2021-04-19 DIAGNOSIS — R52 Pain, unspecified: Secondary | ICD-10-CM | POA: Diagnosis not present

## 2021-04-19 DIAGNOSIS — T7840XA Allergy, unspecified, initial encounter: Secondary | ICD-10-CM | POA: Diagnosis not present

## 2021-04-19 DIAGNOSIS — Z992 Dependence on renal dialysis: Secondary | ICD-10-CM | POA: Diagnosis not present

## 2021-04-19 DIAGNOSIS — N2581 Secondary hyperparathyroidism of renal origin: Secondary | ICD-10-CM | POA: Diagnosis not present

## 2021-04-19 DIAGNOSIS — D631 Anemia in chronic kidney disease: Secondary | ICD-10-CM | POA: Diagnosis not present

## 2021-04-19 DIAGNOSIS — R197 Diarrhea, unspecified: Secondary | ICD-10-CM | POA: Diagnosis not present

## 2021-04-19 DIAGNOSIS — D509 Iron deficiency anemia, unspecified: Secondary | ICD-10-CM | POA: Diagnosis not present

## 2021-04-19 DIAGNOSIS — D689 Coagulation defect, unspecified: Secondary | ICD-10-CM | POA: Diagnosis not present

## 2021-04-19 DIAGNOSIS — N186 End stage renal disease: Secondary | ICD-10-CM | POA: Diagnosis not present

## 2021-04-20 ENCOUNTER — Encounter (HOSPITAL_COMMUNITY): Payer: Self-pay

## 2021-04-21 DIAGNOSIS — R52 Pain, unspecified: Secondary | ICD-10-CM | POA: Diagnosis not present

## 2021-04-21 DIAGNOSIS — Z992 Dependence on renal dialysis: Secondary | ICD-10-CM | POA: Diagnosis not present

## 2021-04-21 DIAGNOSIS — D631 Anemia in chronic kidney disease: Secondary | ICD-10-CM | POA: Diagnosis not present

## 2021-04-21 DIAGNOSIS — D689 Coagulation defect, unspecified: Secondary | ICD-10-CM | POA: Diagnosis not present

## 2021-04-21 DIAGNOSIS — D509 Iron deficiency anemia, unspecified: Secondary | ICD-10-CM | POA: Diagnosis not present

## 2021-04-21 DIAGNOSIS — N186 End stage renal disease: Secondary | ICD-10-CM | POA: Diagnosis not present

## 2021-04-21 DIAGNOSIS — N2581 Secondary hyperparathyroidism of renal origin: Secondary | ICD-10-CM | POA: Diagnosis not present

## 2021-04-21 DIAGNOSIS — Z23 Encounter for immunization: Secondary | ICD-10-CM | POA: Diagnosis not present

## 2021-04-21 DIAGNOSIS — R197 Diarrhea, unspecified: Secondary | ICD-10-CM | POA: Diagnosis not present

## 2021-04-21 DIAGNOSIS — T7840XA Allergy, unspecified, initial encounter: Secondary | ICD-10-CM | POA: Diagnosis not present

## 2021-04-23 DIAGNOSIS — D689 Coagulation defect, unspecified: Secondary | ICD-10-CM | POA: Diagnosis not present

## 2021-04-23 DIAGNOSIS — Z992 Dependence on renal dialysis: Secondary | ICD-10-CM | POA: Diagnosis not present

## 2021-04-23 DIAGNOSIS — T7840XA Allergy, unspecified, initial encounter: Secondary | ICD-10-CM | POA: Diagnosis not present

## 2021-04-23 DIAGNOSIS — N2581 Secondary hyperparathyroidism of renal origin: Secondary | ICD-10-CM | POA: Diagnosis not present

## 2021-04-23 DIAGNOSIS — Z23 Encounter for immunization: Secondary | ICD-10-CM | POA: Diagnosis not present

## 2021-04-23 DIAGNOSIS — D631 Anemia in chronic kidney disease: Secondary | ICD-10-CM | POA: Diagnosis not present

## 2021-04-23 DIAGNOSIS — R197 Diarrhea, unspecified: Secondary | ICD-10-CM | POA: Diagnosis not present

## 2021-04-23 DIAGNOSIS — D509 Iron deficiency anemia, unspecified: Secondary | ICD-10-CM | POA: Diagnosis not present

## 2021-04-23 DIAGNOSIS — N186 End stage renal disease: Secondary | ICD-10-CM | POA: Diagnosis not present

## 2021-04-23 DIAGNOSIS — R52 Pain, unspecified: Secondary | ICD-10-CM | POA: Diagnosis not present

## 2021-04-26 DIAGNOSIS — N186 End stage renal disease: Secondary | ICD-10-CM | POA: Diagnosis not present

## 2021-04-26 DIAGNOSIS — D631 Anemia in chronic kidney disease: Secondary | ICD-10-CM | POA: Diagnosis not present

## 2021-04-26 DIAGNOSIS — R52 Pain, unspecified: Secondary | ICD-10-CM | POA: Diagnosis not present

## 2021-04-26 DIAGNOSIS — T7840XA Allergy, unspecified, initial encounter: Secondary | ICD-10-CM | POA: Diagnosis not present

## 2021-04-26 DIAGNOSIS — Z23 Encounter for immunization: Secondary | ICD-10-CM | POA: Diagnosis not present

## 2021-04-26 DIAGNOSIS — R197 Diarrhea, unspecified: Secondary | ICD-10-CM | POA: Diagnosis not present

## 2021-04-26 DIAGNOSIS — Z992 Dependence on renal dialysis: Secondary | ICD-10-CM | POA: Diagnosis not present

## 2021-04-26 DIAGNOSIS — D689 Coagulation defect, unspecified: Secondary | ICD-10-CM | POA: Diagnosis not present

## 2021-04-26 DIAGNOSIS — N2581 Secondary hyperparathyroidism of renal origin: Secondary | ICD-10-CM | POA: Diagnosis not present

## 2021-04-26 DIAGNOSIS — D509 Iron deficiency anemia, unspecified: Secondary | ICD-10-CM | POA: Diagnosis not present

## 2021-04-27 ENCOUNTER — Encounter: Payer: Medicare Other | Admitting: Family Medicine

## 2021-04-28 DIAGNOSIS — D509 Iron deficiency anemia, unspecified: Secondary | ICD-10-CM | POA: Diagnosis not present

## 2021-04-28 DIAGNOSIS — T7840XA Allergy, unspecified, initial encounter: Secondary | ICD-10-CM | POA: Diagnosis not present

## 2021-04-28 DIAGNOSIS — N2581 Secondary hyperparathyroidism of renal origin: Secondary | ICD-10-CM | POA: Diagnosis not present

## 2021-04-28 DIAGNOSIS — D689 Coagulation defect, unspecified: Secondary | ICD-10-CM | POA: Diagnosis not present

## 2021-04-28 DIAGNOSIS — Z992 Dependence on renal dialysis: Secondary | ICD-10-CM | POA: Diagnosis not present

## 2021-04-28 DIAGNOSIS — N186 End stage renal disease: Secondary | ICD-10-CM | POA: Diagnosis not present

## 2021-04-28 DIAGNOSIS — R197 Diarrhea, unspecified: Secondary | ICD-10-CM | POA: Diagnosis not present

## 2021-04-28 DIAGNOSIS — D631 Anemia in chronic kidney disease: Secondary | ICD-10-CM | POA: Diagnosis not present

## 2021-04-28 DIAGNOSIS — Z23 Encounter for immunization: Secondary | ICD-10-CM | POA: Diagnosis not present

## 2021-04-28 DIAGNOSIS — R52 Pain, unspecified: Secondary | ICD-10-CM | POA: Diagnosis not present

## 2021-04-30 DIAGNOSIS — Z23 Encounter for immunization: Secondary | ICD-10-CM | POA: Diagnosis not present

## 2021-04-30 DIAGNOSIS — R197 Diarrhea, unspecified: Secondary | ICD-10-CM | POA: Diagnosis not present

## 2021-04-30 DIAGNOSIS — D689 Coagulation defect, unspecified: Secondary | ICD-10-CM | POA: Diagnosis not present

## 2021-04-30 DIAGNOSIS — Z992 Dependence on renal dialysis: Secondary | ICD-10-CM | POA: Diagnosis not present

## 2021-04-30 DIAGNOSIS — N2581 Secondary hyperparathyroidism of renal origin: Secondary | ICD-10-CM | POA: Diagnosis not present

## 2021-04-30 DIAGNOSIS — D631 Anemia in chronic kidney disease: Secondary | ICD-10-CM | POA: Diagnosis not present

## 2021-04-30 DIAGNOSIS — R52 Pain, unspecified: Secondary | ICD-10-CM | POA: Diagnosis not present

## 2021-04-30 DIAGNOSIS — T7840XA Allergy, unspecified, initial encounter: Secondary | ICD-10-CM | POA: Diagnosis not present

## 2021-04-30 DIAGNOSIS — D509 Iron deficiency anemia, unspecified: Secondary | ICD-10-CM | POA: Diagnosis not present

## 2021-04-30 DIAGNOSIS — N186 End stage renal disease: Secondary | ICD-10-CM | POA: Diagnosis not present

## 2021-05-03 DIAGNOSIS — R52 Pain, unspecified: Secondary | ICD-10-CM | POA: Diagnosis not present

## 2021-05-03 DIAGNOSIS — D631 Anemia in chronic kidney disease: Secondary | ICD-10-CM | POA: Diagnosis not present

## 2021-05-03 DIAGNOSIS — N2581 Secondary hyperparathyroidism of renal origin: Secondary | ICD-10-CM | POA: Diagnosis not present

## 2021-05-03 DIAGNOSIS — Z23 Encounter for immunization: Secondary | ICD-10-CM | POA: Diagnosis not present

## 2021-05-03 DIAGNOSIS — N186 End stage renal disease: Secondary | ICD-10-CM | POA: Diagnosis not present

## 2021-05-03 DIAGNOSIS — T7840XA Allergy, unspecified, initial encounter: Secondary | ICD-10-CM | POA: Diagnosis not present

## 2021-05-03 DIAGNOSIS — R197 Diarrhea, unspecified: Secondary | ICD-10-CM | POA: Diagnosis not present

## 2021-05-03 DIAGNOSIS — D689 Coagulation defect, unspecified: Secondary | ICD-10-CM | POA: Diagnosis not present

## 2021-05-03 DIAGNOSIS — Z992 Dependence on renal dialysis: Secondary | ICD-10-CM | POA: Diagnosis not present

## 2021-05-03 DIAGNOSIS — D509 Iron deficiency anemia, unspecified: Secondary | ICD-10-CM | POA: Diagnosis not present

## 2021-05-04 DIAGNOSIS — N186 End stage renal disease: Secondary | ICD-10-CM | POA: Diagnosis not present

## 2021-05-04 DIAGNOSIS — N138 Other obstructive and reflux uropathy: Secondary | ICD-10-CM | POA: Diagnosis not present

## 2021-05-04 DIAGNOSIS — Z992 Dependence on renal dialysis: Secondary | ICD-10-CM | POA: Diagnosis not present

## 2021-05-05 DIAGNOSIS — Z992 Dependence on renal dialysis: Secondary | ICD-10-CM | POA: Diagnosis not present

## 2021-05-05 DIAGNOSIS — D631 Anemia in chronic kidney disease: Secondary | ICD-10-CM | POA: Diagnosis not present

## 2021-05-05 DIAGNOSIS — T7840XA Allergy, unspecified, initial encounter: Secondary | ICD-10-CM | POA: Diagnosis not present

## 2021-05-05 DIAGNOSIS — D689 Coagulation defect, unspecified: Secondary | ICD-10-CM | POA: Diagnosis not present

## 2021-05-05 DIAGNOSIS — N186 End stage renal disease: Secondary | ICD-10-CM | POA: Diagnosis not present

## 2021-05-05 DIAGNOSIS — R52 Pain, unspecified: Secondary | ICD-10-CM | POA: Diagnosis not present

## 2021-05-05 DIAGNOSIS — N2581 Secondary hyperparathyroidism of renal origin: Secondary | ICD-10-CM | POA: Diagnosis not present

## 2021-05-05 DIAGNOSIS — D509 Iron deficiency anemia, unspecified: Secondary | ICD-10-CM | POA: Diagnosis not present

## 2021-05-07 DIAGNOSIS — N186 End stage renal disease: Secondary | ICD-10-CM | POA: Diagnosis not present

## 2021-05-07 DIAGNOSIS — D631 Anemia in chronic kidney disease: Secondary | ICD-10-CM | POA: Diagnosis not present

## 2021-05-07 DIAGNOSIS — D689 Coagulation defect, unspecified: Secondary | ICD-10-CM | POA: Diagnosis not present

## 2021-05-07 DIAGNOSIS — T7840XA Allergy, unspecified, initial encounter: Secondary | ICD-10-CM | POA: Diagnosis not present

## 2021-05-07 DIAGNOSIS — N2581 Secondary hyperparathyroidism of renal origin: Secondary | ICD-10-CM | POA: Diagnosis not present

## 2021-05-07 DIAGNOSIS — Z992 Dependence on renal dialysis: Secondary | ICD-10-CM | POA: Diagnosis not present

## 2021-05-07 DIAGNOSIS — R52 Pain, unspecified: Secondary | ICD-10-CM | POA: Diagnosis not present

## 2021-05-07 DIAGNOSIS — D509 Iron deficiency anemia, unspecified: Secondary | ICD-10-CM | POA: Diagnosis not present

## 2021-05-10 DIAGNOSIS — D631 Anemia in chronic kidney disease: Secondary | ICD-10-CM | POA: Diagnosis not present

## 2021-05-10 DIAGNOSIS — D689 Coagulation defect, unspecified: Secondary | ICD-10-CM | POA: Diagnosis not present

## 2021-05-10 DIAGNOSIS — D509 Iron deficiency anemia, unspecified: Secondary | ICD-10-CM | POA: Diagnosis not present

## 2021-05-10 DIAGNOSIS — Z992 Dependence on renal dialysis: Secondary | ICD-10-CM | POA: Diagnosis not present

## 2021-05-10 DIAGNOSIS — N2581 Secondary hyperparathyroidism of renal origin: Secondary | ICD-10-CM | POA: Diagnosis not present

## 2021-05-10 DIAGNOSIS — R52 Pain, unspecified: Secondary | ICD-10-CM | POA: Diagnosis not present

## 2021-05-10 DIAGNOSIS — T7840XA Allergy, unspecified, initial encounter: Secondary | ICD-10-CM | POA: Diagnosis not present

## 2021-05-10 DIAGNOSIS — N186 End stage renal disease: Secondary | ICD-10-CM | POA: Diagnosis not present

## 2021-05-12 DIAGNOSIS — N2581 Secondary hyperparathyroidism of renal origin: Secondary | ICD-10-CM | POA: Diagnosis not present

## 2021-05-12 DIAGNOSIS — D631 Anemia in chronic kidney disease: Secondary | ICD-10-CM | POA: Diagnosis not present

## 2021-05-12 DIAGNOSIS — T7840XA Allergy, unspecified, initial encounter: Secondary | ICD-10-CM | POA: Diagnosis not present

## 2021-05-12 DIAGNOSIS — N186 End stage renal disease: Secondary | ICD-10-CM | POA: Diagnosis not present

## 2021-05-12 DIAGNOSIS — D509 Iron deficiency anemia, unspecified: Secondary | ICD-10-CM | POA: Diagnosis not present

## 2021-05-12 DIAGNOSIS — R52 Pain, unspecified: Secondary | ICD-10-CM | POA: Diagnosis not present

## 2021-05-12 DIAGNOSIS — Z992 Dependence on renal dialysis: Secondary | ICD-10-CM | POA: Diagnosis not present

## 2021-05-12 DIAGNOSIS — D689 Coagulation defect, unspecified: Secondary | ICD-10-CM | POA: Diagnosis not present

## 2021-05-14 DIAGNOSIS — Z992 Dependence on renal dialysis: Secondary | ICD-10-CM | POA: Diagnosis not present

## 2021-05-14 DIAGNOSIS — T7840XA Allergy, unspecified, initial encounter: Secondary | ICD-10-CM | POA: Diagnosis not present

## 2021-05-14 DIAGNOSIS — R52 Pain, unspecified: Secondary | ICD-10-CM | POA: Diagnosis not present

## 2021-05-14 DIAGNOSIS — N186 End stage renal disease: Secondary | ICD-10-CM | POA: Diagnosis not present

## 2021-05-14 DIAGNOSIS — D509 Iron deficiency anemia, unspecified: Secondary | ICD-10-CM | POA: Diagnosis not present

## 2021-05-14 DIAGNOSIS — N2581 Secondary hyperparathyroidism of renal origin: Secondary | ICD-10-CM | POA: Diagnosis not present

## 2021-05-14 DIAGNOSIS — D631 Anemia in chronic kidney disease: Secondary | ICD-10-CM | POA: Diagnosis not present

## 2021-05-14 DIAGNOSIS — D689 Coagulation defect, unspecified: Secondary | ICD-10-CM | POA: Diagnosis not present

## 2021-05-17 DIAGNOSIS — N186 End stage renal disease: Secondary | ICD-10-CM | POA: Diagnosis not present

## 2021-05-17 DIAGNOSIS — D689 Coagulation defect, unspecified: Secondary | ICD-10-CM | POA: Diagnosis not present

## 2021-05-17 DIAGNOSIS — D509 Iron deficiency anemia, unspecified: Secondary | ICD-10-CM | POA: Diagnosis not present

## 2021-05-17 DIAGNOSIS — R52 Pain, unspecified: Secondary | ICD-10-CM | POA: Diagnosis not present

## 2021-05-17 DIAGNOSIS — T7840XA Allergy, unspecified, initial encounter: Secondary | ICD-10-CM | POA: Diagnosis not present

## 2021-05-17 DIAGNOSIS — Z992 Dependence on renal dialysis: Secondary | ICD-10-CM | POA: Diagnosis not present

## 2021-05-17 DIAGNOSIS — N2581 Secondary hyperparathyroidism of renal origin: Secondary | ICD-10-CM | POA: Diagnosis not present

## 2021-05-17 DIAGNOSIS — D631 Anemia in chronic kidney disease: Secondary | ICD-10-CM | POA: Diagnosis not present

## 2021-05-19 DIAGNOSIS — N186 End stage renal disease: Secondary | ICD-10-CM | POA: Diagnosis not present

## 2021-05-19 DIAGNOSIS — T7840XA Allergy, unspecified, initial encounter: Secondary | ICD-10-CM | POA: Diagnosis not present

## 2021-05-19 DIAGNOSIS — Z992 Dependence on renal dialysis: Secondary | ICD-10-CM | POA: Diagnosis not present

## 2021-05-19 DIAGNOSIS — D631 Anemia in chronic kidney disease: Secondary | ICD-10-CM | POA: Diagnosis not present

## 2021-05-19 DIAGNOSIS — N2581 Secondary hyperparathyroidism of renal origin: Secondary | ICD-10-CM | POA: Diagnosis not present

## 2021-05-19 DIAGNOSIS — D689 Coagulation defect, unspecified: Secondary | ICD-10-CM | POA: Diagnosis not present

## 2021-05-19 DIAGNOSIS — R52 Pain, unspecified: Secondary | ICD-10-CM | POA: Diagnosis not present

## 2021-05-19 DIAGNOSIS — D509 Iron deficiency anemia, unspecified: Secondary | ICD-10-CM | POA: Diagnosis not present

## 2021-05-21 DIAGNOSIS — D631 Anemia in chronic kidney disease: Secondary | ICD-10-CM | POA: Diagnosis not present

## 2021-05-21 DIAGNOSIS — R52 Pain, unspecified: Secondary | ICD-10-CM | POA: Diagnosis not present

## 2021-05-21 DIAGNOSIS — D509 Iron deficiency anemia, unspecified: Secondary | ICD-10-CM | POA: Diagnosis not present

## 2021-05-21 DIAGNOSIS — T7840XA Allergy, unspecified, initial encounter: Secondary | ICD-10-CM | POA: Diagnosis not present

## 2021-05-21 DIAGNOSIS — N186 End stage renal disease: Secondary | ICD-10-CM | POA: Diagnosis not present

## 2021-05-21 DIAGNOSIS — D689 Coagulation defect, unspecified: Secondary | ICD-10-CM | POA: Diagnosis not present

## 2021-05-21 DIAGNOSIS — Z992 Dependence on renal dialysis: Secondary | ICD-10-CM | POA: Diagnosis not present

## 2021-05-21 DIAGNOSIS — N2581 Secondary hyperparathyroidism of renal origin: Secondary | ICD-10-CM | POA: Diagnosis not present

## 2021-05-24 DIAGNOSIS — Z992 Dependence on renal dialysis: Secondary | ICD-10-CM | POA: Diagnosis not present

## 2021-05-24 DIAGNOSIS — N2581 Secondary hyperparathyroidism of renal origin: Secondary | ICD-10-CM | POA: Diagnosis not present

## 2021-05-24 DIAGNOSIS — D631 Anemia in chronic kidney disease: Secondary | ICD-10-CM | POA: Diagnosis not present

## 2021-05-24 DIAGNOSIS — T7840XA Allergy, unspecified, initial encounter: Secondary | ICD-10-CM | POA: Diagnosis not present

## 2021-05-24 DIAGNOSIS — R52 Pain, unspecified: Secondary | ICD-10-CM | POA: Diagnosis not present

## 2021-05-24 DIAGNOSIS — D509 Iron deficiency anemia, unspecified: Secondary | ICD-10-CM | POA: Diagnosis not present

## 2021-05-24 DIAGNOSIS — N186 End stage renal disease: Secondary | ICD-10-CM | POA: Diagnosis not present

## 2021-05-24 DIAGNOSIS — D689 Coagulation defect, unspecified: Secondary | ICD-10-CM | POA: Diagnosis not present

## 2021-05-25 ENCOUNTER — Other Ambulatory Visit: Payer: Self-pay

## 2021-05-25 ENCOUNTER — Encounter (HOSPITAL_BASED_OUTPATIENT_CLINIC_OR_DEPARTMENT_OTHER): Payer: Self-pay | Admitting: Urology

## 2021-05-25 NOTE — Progress Notes (Signed)
Spoke w/ via phone for pre-op interview---pt Lab needs dos---- I stat, ekg              Lab results------see below COVID test -----patient states asymptomatic no test needed Arrive at -------715 am 06-04-2021 NPO after MN NO Solid Food.  Clear liquids from MN until---615 am  Med rec completed Medications to take morning of surgery -----albuterol inhaler prn/bring inhaler, allopurinol, alprazolam prn, metoprolol succinate, pantaprazole,  Diabetic medication -----n/a Patient instructed to bring photo id and insurance card day of surgery Patient aware to have Driver (ride ) / caregiver    for 24 hours after surgery  wife Pamala Hurry Patient Special Instructions -----no smoking 24 hours before surgery Pre-Op special Istructions -----none Patient verbalized understanding of instructions that were given at this phone interview. Patient denies shortness of breath, chest pain, fever, cough at this phone interview.   Lov dr Gwyndolyn Kaufman cardiology 04-10-2020 for palpitaions f/u in 3 months  Echo 04-27-2020 epic  Peripheral vascular catherization 09-04-2020 epic  Last hemodialysis visit  dr Laurena Bering nephrology (Primary nephrologist)  03-29-2021 on chart, last hemodialysis visit note dr v singh nephrology 05-24-2021 on chart  Pt states at pre op call no cardiac s & s or sob, no palpitaions since he saw dr h. Pemberton 04-10-2020

## 2021-05-26 ENCOUNTER — Encounter (HOSPITAL_BASED_OUTPATIENT_CLINIC_OR_DEPARTMENT_OTHER): Payer: Self-pay | Admitting: Urology

## 2021-05-26 ENCOUNTER — Encounter (HOSPITAL_COMMUNITY): Payer: Self-pay

## 2021-05-26 DIAGNOSIS — R52 Pain, unspecified: Secondary | ICD-10-CM | POA: Diagnosis not present

## 2021-05-26 DIAGNOSIS — Z992 Dependence on renal dialysis: Secondary | ICD-10-CM | POA: Diagnosis not present

## 2021-05-26 DIAGNOSIS — D509 Iron deficiency anemia, unspecified: Secondary | ICD-10-CM | POA: Diagnosis not present

## 2021-05-26 DIAGNOSIS — T7840XA Allergy, unspecified, initial encounter: Secondary | ICD-10-CM | POA: Diagnosis not present

## 2021-05-26 DIAGNOSIS — D631 Anemia in chronic kidney disease: Secondary | ICD-10-CM | POA: Diagnosis not present

## 2021-05-26 DIAGNOSIS — N2581 Secondary hyperparathyroidism of renal origin: Secondary | ICD-10-CM | POA: Diagnosis not present

## 2021-05-26 DIAGNOSIS — N186 End stage renal disease: Secondary | ICD-10-CM | POA: Diagnosis not present

## 2021-05-26 DIAGNOSIS — D689 Coagulation defect, unspecified: Secondary | ICD-10-CM | POA: Diagnosis not present

## 2021-05-28 DIAGNOSIS — Z992 Dependence on renal dialysis: Secondary | ICD-10-CM | POA: Diagnosis not present

## 2021-05-28 DIAGNOSIS — D689 Coagulation defect, unspecified: Secondary | ICD-10-CM | POA: Diagnosis not present

## 2021-05-28 DIAGNOSIS — R52 Pain, unspecified: Secondary | ICD-10-CM | POA: Diagnosis not present

## 2021-05-28 DIAGNOSIS — D631 Anemia in chronic kidney disease: Secondary | ICD-10-CM | POA: Diagnosis not present

## 2021-05-28 DIAGNOSIS — T7840XA Allergy, unspecified, initial encounter: Secondary | ICD-10-CM | POA: Diagnosis not present

## 2021-05-28 DIAGNOSIS — N186 End stage renal disease: Secondary | ICD-10-CM | POA: Diagnosis not present

## 2021-05-28 DIAGNOSIS — D509 Iron deficiency anemia, unspecified: Secondary | ICD-10-CM | POA: Diagnosis not present

## 2021-05-28 DIAGNOSIS — N2581 Secondary hyperparathyroidism of renal origin: Secondary | ICD-10-CM | POA: Diagnosis not present

## 2021-05-31 DIAGNOSIS — D689 Coagulation defect, unspecified: Secondary | ICD-10-CM | POA: Diagnosis not present

## 2021-05-31 DIAGNOSIS — R52 Pain, unspecified: Secondary | ICD-10-CM | POA: Diagnosis not present

## 2021-05-31 DIAGNOSIS — Z992 Dependence on renal dialysis: Secondary | ICD-10-CM | POA: Diagnosis not present

## 2021-05-31 DIAGNOSIS — D631 Anemia in chronic kidney disease: Secondary | ICD-10-CM | POA: Diagnosis not present

## 2021-05-31 DIAGNOSIS — T7840XA Allergy, unspecified, initial encounter: Secondary | ICD-10-CM | POA: Diagnosis not present

## 2021-05-31 DIAGNOSIS — N186 End stage renal disease: Secondary | ICD-10-CM | POA: Diagnosis not present

## 2021-05-31 DIAGNOSIS — D509 Iron deficiency anemia, unspecified: Secondary | ICD-10-CM | POA: Diagnosis not present

## 2021-05-31 DIAGNOSIS — N2581 Secondary hyperparathyroidism of renal origin: Secondary | ICD-10-CM | POA: Diagnosis not present

## 2021-06-02 DIAGNOSIS — D509 Iron deficiency anemia, unspecified: Secondary | ICD-10-CM | POA: Diagnosis not present

## 2021-06-02 DIAGNOSIS — R52 Pain, unspecified: Secondary | ICD-10-CM | POA: Diagnosis not present

## 2021-06-02 DIAGNOSIS — N186 End stage renal disease: Secondary | ICD-10-CM | POA: Diagnosis not present

## 2021-06-02 DIAGNOSIS — D631 Anemia in chronic kidney disease: Secondary | ICD-10-CM | POA: Diagnosis not present

## 2021-06-02 DIAGNOSIS — T7840XA Allergy, unspecified, initial encounter: Secondary | ICD-10-CM | POA: Diagnosis not present

## 2021-06-02 DIAGNOSIS — D689 Coagulation defect, unspecified: Secondary | ICD-10-CM | POA: Diagnosis not present

## 2021-06-02 DIAGNOSIS — N2581 Secondary hyperparathyroidism of renal origin: Secondary | ICD-10-CM | POA: Diagnosis not present

## 2021-06-02 DIAGNOSIS — Z992 Dependence on renal dialysis: Secondary | ICD-10-CM | POA: Diagnosis not present

## 2021-06-04 ENCOUNTER — Other Ambulatory Visit: Payer: Self-pay

## 2021-06-04 ENCOUNTER — Encounter (HOSPITAL_BASED_OUTPATIENT_CLINIC_OR_DEPARTMENT_OTHER)
Admission: RE | Disposition: A | Discharge status: Home / Self Care | Payer: Self-pay | Source: Home / Self Care | Attending: Urology

## 2021-06-04 ENCOUNTER — Ambulatory Visit (HOSPITAL_BASED_OUTPATIENT_CLINIC_OR_DEPARTMENT_OTHER): Payer: Medicare Other | Admitting: Anesthesiology

## 2021-06-04 ENCOUNTER — Encounter (HOSPITAL_BASED_OUTPATIENT_CLINIC_OR_DEPARTMENT_OTHER): Payer: Self-pay | Admitting: Urology

## 2021-06-04 ENCOUNTER — Ambulatory Visit (HOSPITAL_BASED_OUTPATIENT_CLINIC_OR_DEPARTMENT_OTHER)
Admission: RE | Admit: 2021-06-04 | Discharge: 2021-06-04 | Disposition: A | Discharge status: Home / Self Care | Payer: Medicare Other | Attending: Urology | Admitting: Urology

## 2021-06-04 DIAGNOSIS — E669 Obesity, unspecified: Secondary | ICD-10-CM | POA: Insufficient documentation

## 2021-06-04 DIAGNOSIS — G893 Neoplasm related pain (acute) (chronic): Secondary | ICD-10-CM | POA: Diagnosis not present

## 2021-06-04 DIAGNOSIS — N135 Crossing vessel and stricture of ureter without hydronephrosis: Secondary | ICD-10-CM | POA: Insufficient documentation

## 2021-06-04 DIAGNOSIS — N186 End stage renal disease: Secondary | ICD-10-CM | POA: Insufficient documentation

## 2021-06-04 DIAGNOSIS — Z992 Dependence on renal dialysis: Secondary | ICD-10-CM | POA: Insufficient documentation

## 2021-06-04 DIAGNOSIS — F17210 Nicotine dependence, cigarettes, uncomplicated: Secondary | ICD-10-CM | POA: Insufficient documentation

## 2021-06-04 DIAGNOSIS — Z6836 Body mass index (BMI) 36.0-36.9, adult: Secondary | ICD-10-CM | POA: Insufficient documentation

## 2021-06-04 DIAGNOSIS — I12 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 stage renal disease: Secondary | ICD-10-CM | POA: Insufficient documentation

## 2021-06-04 DIAGNOSIS — Z466 Encounter for fitting and adjustment of urinary device: Secondary | ICD-10-CM | POA: Diagnosis not present

## 2021-06-04 DIAGNOSIS — N131 Hydronephrosis with ureteral stricture, not elsewhere classified: Secondary | ICD-10-CM | POA: Insufficient documentation

## 2021-06-04 DIAGNOSIS — N2889 Other specified disorders of kidney and ureter: Secondary | ICD-10-CM | POA: Diagnosis not present

## 2021-06-04 DIAGNOSIS — C7951 Secondary malignant neoplasm of bone: Secondary | ICD-10-CM | POA: Diagnosis not present

## 2021-06-04 DIAGNOSIS — R7303 Prediabetes: Secondary | ICD-10-CM | POA: Insufficient documentation

## 2021-06-04 DIAGNOSIS — C61 Malignant neoplasm of prostate: Secondary | ICD-10-CM | POA: Insufficient documentation

## 2021-06-04 DIAGNOSIS — G4733 Obstructive sleep apnea (adult) (pediatric): Secondary | ICD-10-CM | POA: Diagnosis not present

## 2021-06-04 HISTORY — PX: CYSTOSCOPY W/ URETERAL STENT PLACEMENT: SHX1429

## 2021-06-04 LAB — POCT I-STAT, CHEM 8
BUN: 47 mg/dL — ABNORMAL HIGH (ref 6–20)
Calcium, Ion: 1 mmol/L — ABNORMAL LOW (ref 1.15–1.40)
Chloride: 104 mmol/L (ref 98–111)
Creatinine, Ser: 12 mg/dL — ABNORMAL HIGH (ref 0.61–1.24)
Glucose, Bld: 95 mg/dL (ref 70–99)
HCT: 38 % — ABNORMAL LOW (ref 39.0–52.0)
Hemoglobin: 12.9 g/dL — ABNORMAL LOW (ref 13.0–17.0)
Potassium: 4.4 mmol/L (ref 3.5–5.1)
Sodium: 139 mmol/L (ref 135–145)
TCO2: 25 mmol/L (ref 22–32)

## 2021-06-04 SURGERY — CYSTOSCOPY, WITH RETROGRADE PYELOGRAM AND URETERAL STENT INSERTION
Anesthesia: General | Site: Bladder | Laterality: Bilateral

## 2021-06-04 MED ORDER — FENTANYL CITRATE (PF) 100 MCG/2ML IJ SOLN: 25.0000 ug | INTRAMUSCULAR | Status: DC | PRN

## 2021-06-04 MED ORDER — FENTANYL CITRATE (PF) 100 MCG/2ML IJ SOLN
INTRAMUSCULAR | Status: DC | PRN
  Administered 2021-06-04: 50 ug via INTRAVENOUS

## 2021-06-04 MED ORDER — WHITE PETROLATUM EX OINT
TOPICAL_OINTMENT | CUTANEOUS | Status: AC
  Filled 2021-06-04: qty 5

## 2021-06-04 MED ORDER — ONDANSETRON HCL 4 MG/2ML IJ SOLN: 4.0000 mg | Freq: Once | INTRAMUSCULAR | Status: DC | PRN

## 2021-06-04 MED ORDER — OXYCODONE-ACETAMINOPHEN 5-325 MG PO TABS: 1.0000 | ORAL_TABLET | Freq: Four times a day (QID) | ORAL | 0 refills | Status: AC | PRN

## 2021-06-04 MED ORDER — SODIUM CHLORIDE 0.9 % IV SOLN
INTRAVENOUS | Status: AC
  Filled 2021-06-04: qty 100

## 2021-06-04 MED ORDER — OXYCODONE HCL 5 MG/5ML PO SOLN: 5.0000 mg | Freq: Once | ORAL | Status: AC | PRN

## 2021-06-04 MED ORDER — SODIUM CHLORIDE 0.9 % IV SOLN
1.0000 g | INTRAVENOUS | Status: AC
  Administered 2021-06-04: 1 g via INTRAVENOUS

## 2021-06-04 MED ORDER — ACETAMINOPHEN 10 MG/ML IV SOLN: 1000.0000 mg | Freq: Once | INTRAVENOUS | Status: DC | PRN

## 2021-06-04 MED ORDER — OXYCODONE HCL 5 MG PO TABS
ORAL_TABLET | ORAL | Status: AC
  Filled 2021-06-04: qty 1

## 2021-06-04 MED ORDER — PROPOFOL 10 MG/ML IV BOLUS
INTRAVENOUS | Status: AC
  Filled 2021-06-04: qty 20

## 2021-06-04 MED ORDER — CEFTRIAXONE SODIUM 1 G IJ SOLR
INTRAMUSCULAR | Status: AC
  Filled 2021-06-04: qty 10

## 2021-06-04 MED ORDER — PROPOFOL 10 MG/ML IV BOLUS
INTRAVENOUS | Status: DC | PRN
  Administered 2021-06-04: 200 mg via INTRAVENOUS

## 2021-06-04 MED ORDER — IOHEXOL 300 MG/ML  SOLN
INTRAMUSCULAR | Status: DC | PRN
  Administered 2021-06-04: 10 mL via URETHRAL

## 2021-06-04 MED ORDER — MIDAZOLAM HCL 2 MG/2ML IJ SOLN
INTRAMUSCULAR | Status: DC | PRN
  Administered 2021-06-04: 1 mg via INTRAVENOUS

## 2021-06-04 MED ORDER — SODIUM CHLORIDE 0.9 % IR SOLN
Status: DC | PRN
  Administered 2021-06-04: 3000 mL via INTRAVESICAL

## 2021-06-04 MED ORDER — MIDAZOLAM HCL 2 MG/2ML IJ SOLN
INTRAMUSCULAR | Status: AC
  Filled 2021-06-04: qty 2

## 2021-06-04 MED ORDER — LIDOCAINE 2% (20 MG/ML) 5 ML SYRINGE
INTRAMUSCULAR | Status: AC
  Filled 2021-06-04: qty 5

## 2021-06-04 MED ORDER — SODIUM CHLORIDE 0.9 % IV SOLN: INTRAVENOUS | Status: DC

## 2021-06-04 MED ORDER — FENTANYL CITRATE (PF) 100 MCG/2ML IJ SOLN
INTRAMUSCULAR | Status: AC
  Filled 2021-06-04: qty 2

## 2021-06-04 MED ORDER — LIDOCAINE 2% (20 MG/ML) 5 ML SYRINGE
INTRAMUSCULAR | Status: DC | PRN
  Administered 2021-06-04: 100 mg via INTRAVENOUS

## 2021-06-04 MED ORDER — OXYCODONE HCL 5 MG PO TABS
5.0000 mg | ORAL_TABLET | Freq: Once | ORAL | Status: AC | PRN
  Administered 2021-06-04: 5 mg via ORAL

## 2021-06-04 SURGICAL SUPPLY — 25 items
BAG DRAIN URO-CYSTO SKYTR STRL (DRAIN) ×2 IMPLANT
BAG DRN UROCATH (DRAIN) ×1
BASKET LASER NITINOL 1.9FR (BASKET) IMPLANT
BSKT STON RTRVL 120 1.9FR (BASKET)
CATH INTERMIT  6FR 70CM (CATHETERS) IMPLANT
CLOTH BEACON ORANGE TIMEOUT ST (SAFETY) ×2 IMPLANT
FIBER LASER FLEXIVA 365 (UROLOGICAL SUPPLIES) IMPLANT
GLOVE SURG ENC MOIS LTX SZ7.5 (GLOVE) ×2 IMPLANT
GOWN STRL REUS W/TWL LRG LVL3 (GOWN DISPOSABLE) ×2 IMPLANT
GUIDEWIRE ANG ZIPWIRE 038X150 (WIRE) ×2 IMPLANT
GUIDEWIRE STR DUAL SENSOR (WIRE) ×2 IMPLANT
IV NS 1000ML (IV SOLUTION) ×2
IV NS 1000ML BAXH (IV SOLUTION) ×1 IMPLANT
IV NS IRRIG 3000ML ARTHROMATIC (IV SOLUTION) ×2 IMPLANT
KIT TURNOVER CYSTO (KITS) ×2 IMPLANT
MANIFOLD NEPTUNE II (INSTRUMENTS) ×2 IMPLANT
NS IRRIG 500ML POUR BTL (IV SOLUTION) ×2 IMPLANT
PACK CYSTO (CUSTOM PROCEDURE TRAY) ×2 IMPLANT
SYR 10ML LL (SYRINGE) ×2 IMPLANT
TRACTIP FLEXIVA PULS ID 200XHI (Laser) IMPLANT
TRACTIP FLEXIVA PULSE ID 200 (Laser)
TUBE CONNECTING 12X1/4 (SUCTIONS) ×2 IMPLANT
TUBE FEEDING 8FR 16IN STR KANG (MISCELLANEOUS) IMPLANT
TUBING UROLOGY SET (TUBING) ×2 IMPLANT
WATER STERILE IRR 3000ML UROMA (IV SOLUTION) ×2 IMPLANT

## 2021-06-04 NOTE — Anesthesia Procedure Notes (Signed)
Procedure Name: LMA Insertion Date/Time: 06/04/2021 9:14 AM Performed by: Suan Halter, CRNA Pre-anesthesia Checklist: Patient identified, Emergency Drugs available, Suction available and Patient being monitored Patient Re-evaluated:Patient Re-evaluated prior to induction Oxygen Delivery Method: Circle system utilized Preoxygenation: Pre-oxygenation with 100% oxygen Induction Type: IV induction Ventilation: Mask ventilation without difficulty LMA: LMA inserted LMA Size: 5.0 Number of attempts: 1 Airway Equipment and Method: Bite block Placement Confirmation: positive ETCO2 Tube secured with: Tape Dental Injury: Teeth and Oropharynx as per pre-operative assessment

## 2021-06-04 NOTE — Anesthesia Preprocedure Evaluation (Signed)
Anesthesia Evaluation  Patient identified by MRN, date of birth, ID band Patient awake    Reviewed: Allergy & Precautions, H&P , NPO status , Patient's Chart, lab work & pertinent test results  Airway Mallampati: II  TM Distance: >3 FB Neck ROM: Full    Dental no notable dental hx.    Pulmonary sleep apnea , Current Smoker and Patient abstained from smoking.,    Pulmonary exam normal breath sounds clear to auscultation       Cardiovascular hypertension, Normal cardiovascular exam Rhythm:Regular Rate:Normal     Neuro/Psych negative neurological ROS  negative psych ROS   GI/Hepatic negative GI ROS, Neg liver ROS,   Endo/Other  negative endocrine ROS  Renal/GU DialysisRenal diseaseMetastatic prostate cancer  negative genitourinary   Musculoskeletal negative musculoskeletal ROS (+)   Abdominal   Peds negative pediatric ROS (+)  Hematology  (+) anemia ,   Anesthesia Other Findings   Reproductive/Obstetrics negative OB ROS                             Anesthesia Physical Anesthesia Plan  ASA: 3  Anesthesia Plan: General   Post-op Pain Management:    Induction: Intravenous  PONV Risk Score and Plan: 1 and Ondansetron and Treatment may vary due to age or medical condition  Airway Management Planned: LMA  Additional Equipment:   Intra-op Plan:   Post-operative Plan: Extubation in OR  Informed Consent: I have reviewed the patients History and Physical, chart, labs and discussed the procedure including the risks, benefits and alternatives for the proposed anesthesia with the patient or authorized representative who has indicated his/her understanding and acceptance.     Dental advisory given  Plan Discussed with: CRNA and Surgeon  Anesthesia Plan Comments:         Anesthesia Quick Evaluation

## 2021-06-04 NOTE — Anesthesia Postprocedure Evaluation (Signed)
Anesthesia Post Note  Patient: Devon Cook  Procedure(s) Performed: CYSTOSCOPY WITH RETROGRADE PYELOGRAM/URETERAL STENT EXCHANGE (Bilateral: Bladder)     Patient location during evaluation: PACU Anesthesia Type: General Level of consciousness: awake and alert Pain management: pain level controlled Vital Signs Assessment: post-procedure vital signs reviewed and stable Respiratory status: spontaneous breathing, nonlabored ventilation, respiratory function stable and patient connected to nasal cannula oxygen Cardiovascular status: blood pressure returned to baseline and stable Postop Assessment: no apparent nausea or vomiting Anesthetic complications: no   No notable events documented.  Last Vitals:  Vitals:   06/04/21 1000 06/04/21 1015  BP: (!) 158/99 (!) 166/98  Pulse: 70 72  Resp: 17 14  Temp:    SpO2: 98% 98%    Last Pain:  Vitals:   06/04/21 1015  TempSrc:   PainSc: 0-No pain                 Shaia Porath S

## 2021-06-04 NOTE — Op Note (Signed)
NAME: Devon Cook, Devon Cook MEDICAL RECORD NO: [id number] ACCOUNT NO: [id number] DATE OF BIRTH: 1960-11-21 FACILITY: [place] LOCATION: [facility] PHYSICIAN: Alexis Frock, MD  Operative Report   DATE OF PROCEDURE: 06/04/2021  PREOPERATIVE DIAGNOSIS:  Bilateral malignant ureteral obstruction.  POSTOPERATIVE DIAGNOSIS:  Bilateral malignant ureteral obstruction.  PROCEDURE PERFORMED: 1.  Cystoscopy with bilateral retrograde pyelograms. 2.  Exchange of bilateral ureteral stents.  BLOOD LOSS:  Nil.  COMPLICATIONS:  None.  SPECIMEN:  Bilateral ureteral stents inspected and intact for discard.  FINDINGS:   1.  Mild proximal pendulous urethral narrowing without frank stricture. 2.  Fixation of the prostate consistent with cancer. 3.  Significantly improved bilateral hydronephrosis and successful exchange of bilateral ureteral stents, 7 x 26 contour, no tether.  INDICATIONS:  The patient is a very pleasant unfortunate 60 year old man with a several year history of metastatic prostate cancer.  He has been on medical therapy for this and is very compliant.  As a part of this, he has had malignant obstruction that  has been quite severe, causing end-stage renal disease.  He is now on dialysis.  He does make some urine, but does require stenting to prevent further complications of obstructive uropathy including severe infections.  He is very compliant with stent  changes with the intervals now being approximately 12 months.  His last stent change was just over 12 months ago.  He presents for planned stent change today.  Informed consent was obtained and placed in the medical record.  DESCRIPTION OF PROCEDURE:  The patient being identified and procedure being bilateral ureteral stent exchange was confirmed.  The procedure timeout was performed.  Intravenous antibiotics were administered.  General LMA anesthesia induced.  The patient  was placed in the low lithotomy position.  A Sterile field was  created, prepped and draped the patient's penis, perineum, and proximal thigh using iodine.  Cystourethroscopy was performed using 21-French rigid cystoscope with offset lens.  The patient's  anterior and posterior urethra did reveal some moderate narrowing at the proximal pendulous urethra without frank stricturing.  This did not require anything other than dilation with the scope.  The prostatic urethra was somewhat fixed given his known  aggressive cancer, but was widely patent.  Inspection of the urinary bladder revealed distal end of bilateral ureteral stents and widely patent visibly ureteral orifices.  Right distal stent was grasped, brought to the level of the urethral meatus.  A  0.038 ZIPwire was advanced in the lower pole exchanged for an open-ended catheter and a right retrograde pyelogram was obtained.  Right retrograde pyelogram demonstrated a single right ureter, single system right kidney.  There is mild hydronephrosis noted.  The ZIPwire was advanced in the lower pole and a new 7 x 26 contour type stent was placed using cystoscopic and fluoroscopic  guidance.  Good proximal and distal planes were noted.  Next, the distal end of the left stent was grasped, brought to the level of the urethral meatus.  A 0.038 ZIPwire advanced to the level of the lower pole.  The stent was exchanged open-ended  catheter and left retrograde pyelogram was obtained.  Left retrograde pyelogram demonstrated left ureter, single system left kidney.  There was mild hydronephrosis noted.  This is significantly improved from prior and a new 7 x 26 contour type stent was placed over the left working wire using cystoscopic  and fluoroscopic guidance.  Good proximal and distal planes were noted.  Bladder was partially emptied with cystoscope.  Procedure was then terminated.  The patient tolerated the procedure well.  No immediate periprocedural complications.  The patient  was taken to postanesthesia care unit in stable  condition.  Plan for discharge home.   PUS D: 06/04/2021 9:40:22 am T: 06/04/2021 2:06:00 pm  JOB: [id number]/ [id number]

## 2021-06-04 NOTE — Brief Op Note (Signed)
06/04/2021  9:35 AM  PATIENT:  Devon Cook  60 y.o. male  PRE-OPERATIVE DIAGNOSIS:  BILATERAL URETERAL DILATION  POST-OPERATIVE DIAGNOSIS:  BILATERAL URETERAL DILATION  PROCEDURE:  Procedure(s) with comments: CYSTOSCOPY WITH RETROGRADE PYELOGRAM/URETERAL STENT EXCHANGE (Bilateral) - 45 MINS  SURGEON:  Surgeon(s) and Role:    * Alexis Frock, MD - Primary  PHYSICIAN ASSISTANT:   ASSISTANTS: none   ANESTHESIA:   general  EBL:  minimal   BLOOD ADMINISTERED:none  DRAINS: none   LOCAL MEDICATIONS USED:  NONE  SPECIMEN:  Source of Specimen:  bilateral ureteral stents, inspected and intact  DISPOSITION OF SPECIMEN:   discard  COUNTS:  YES  TOURNIQUET:  * No tourniquets in log *  DICTATION: .Other Dictation: Dictation Number [id number]  PLAN OF CARE: Discharge to home after PACU  PATIENT DISPOSITION:  PACU - hemodynamically stable.   Delay start of Pharmacological VTE agent (>24hrs) due to surgical blood loss or risk of bleeding: yes

## 2021-06-04 NOTE — Transfer of Care (Signed)
Immediate Anesthesia Transfer of Care Note  Patient: Devon Cook  Procedure(s) Performed: Procedure(s) (LRB): CYSTOSCOPY WITH RETROGRADE PYELOGRAM/URETERAL STENT EXCHANGE (Bilateral)  Patient Location: PACU  Anesthesia Type: General  Level of Consciousness: awake, oriented, sedated and patient cooperative  Airway & Oxygen Therapy: Patient Spontanous Breathing and Patient connected to face mask oxygen  Post-op Assessment: Report given to PACU RN and Post -op Vital signs reviewed and stable  Post vital signs: Reviewed and stable  Complications: No apparent anesthesia complications Last Vitals:  Vitals Value Taken Time  BP 159/91 06/04/21 0948  Temp 36.8 C 06/04/21 0945  Pulse 75 06/04/21 0956  Resp 19 06/04/21 0956  SpO2 100 % 06/04/21 0956  Vitals shown include unvalidated device data.  Last Pain:  Vitals:   06/04/21 0945  TempSrc:   PainSc: 0-No pain         Complications: No notable events documented.

## 2021-06-04 NOTE — Progress Notes (Signed)
Fistula left upper arm with good bruit and thrill. Dialysis cath left subclavian unremarkable. Due for dialysis Saturday 06/05/2021.

## 2021-06-04 NOTE — H&P (Signed)
Devon Cook is an 60 y.o. male.    Chief Complaint: Pre-Op BILATERAL Ureteral Stent Exchange  HPI:   1 - Prostate Cancer-  Initial Diagnosis - Gl9 metastatic cancer 12/2017 on eval PSA 787.  Primary Therapy - androgen deprivation.  Prior Treatments - androgen deprivation.  Current Treatment - androgen deprivation + Xtandi (variable, not qualified yet for funding, getting samples when avail)  Most Recent Restaging - 02/2020 CT, BS numerous small bone mets including spine and ribs   Recent Course -  08/2018 OR Exchange bilat stents (7x26), Cr 6.2, much improved bladder neck / prostate nodular tissue  01/2019 - OR Exchange bilat stents (7x26), PSA 0.016 / T<10 ==> Lupron 45; 04/2019 - PSA 0.02 / T 21, Cr 8.5; 05/2019 - OR Exchange bilat stents (7x26)  07/2019 - PSA .056 / T 13 ==> Eligard 45; 09/2019 OR exchange bilateral stents (7x26, minimal encrustation); 02/2020 - PSA .038, T 12, CMP ok ==> Eligard 45;  05/2020 - PSA, T - pending, OR stent exchange (7x26, minimal encrustation)  09/2020 - PSA <.06, T10 ==> Eligard 45 ; 12/2020 PSA <.06, T <3; 04/2021 (labs pending) ==> Eligard 45   2 - Bone Health / Bone Mets - L3 spine mets w/o compression  Prior DEXA - 04/2019 T -0.4 (normal)  Most Recent labs - 04/2019 Ca normal, Vit D 29 (low)  Current Treatment - managed through nephrology   3 - Malignant Ureteral Obstruction - Cr 17 by PCP labs on eval malaize 11/2017. Non-con CT with hydro to level of thickened bladder. Bilateral neph tubes placed 11/2017 and converted to bilateral 7x26 stents 12/2017 in OR and then as per above. Now established with [place] [facility] with GFR 15 range, requires aranesp and PRN transfusion.   4- End Stage Renal Disease - Cr 4-8's after bilateral renal decompression c/w permanent renal damage. Hemodialysis initiated 2020 with Dr. Lorrene Reid at [facility]. Has Left AVF that is matureing. May consider PD.   5 - Cancer Pain - slowly progressive bone pain, especially hip that  is interfering with activities. Uses percocet QHS very sparingly for this.   6 - Urinary URgency - worsening bother from urgency with some occasiona near incontinence episodes, mostly daytime. Starting daily PRN oxybutynin trial 09/2020.   PMH sig for pre-DM, obesity, L spine arthritis. NO isschemic CV disease / blood thinners.   Today "Devon Cook" is seen to proceed with BILATERAL ureteral stent exchange. No interval fevers. Most recent UA without significant infectious paramteres. Hgb 10.7, Cr 10s with normal K.    Past Medical History:  Diagnosis Date   Anemia, chronic renal failure    followed by dr Hollie Salk--- dx 05/ 2019---  treated with Iron infusions and procrit   Bilateral hydronephrosis urologist-- dr Tresa Moore   malignant-- chronic;  treated with ureteral stents   Chronic gastritis    07-17-2019 and duodenitis   ED (erectile dysfunction)    Edema of both lower extremities    occ le edema wears compresssion hose daily   End stage renal failure on dialysis [facility])    nephrologist-- dr Hollie Salk (@ [facility])-- secondary to obstructive uropathy secon  dary to prostate cancer hemodialysis mon/wed/fri @ [facility]---started first week of Nov 2020   GERD (gastroesophageal reflux disease)    Gout 2019   Herpes genitalis in men    History of adenomatous polyp of colon    History of COVID-19 07/2020   History of hepatitis C 2017   dx  11/ 207-- treated with Harvoni (in epic 04/ 2019 lab result non-detectable)   History of lower GI bleeding 11/2017   Hypertension    followed by pcp   OSA (obstructive sleep apnea)    severe osa w/ nocturnal hyoxemia per study 06-19-2019,  pt given cpap ( however on 08-30-2019 per pt having issues with mask fitting and on a new one so he not using the cpap);   (03-20-2020 still not using cpap, no mask)   Palpitations    isolated event at ED 10-05-2019 Aflutter/ Afib versus SVT ;   refer to pt's cardiologist note w/ Dr Johney Frame   Prostate cancer  metastatic to multiple sites [facility]) urologist-- dr Tresa Moore   dx 06/ 2019-- advanced w/ mets to bone and pelvic lymphadeopathy   S/P arteriovenous (AV) fistula creation followed by dr Carlis Abbott (vascular)   03-15-2019  by dr Carlis Abbott, left branhiocephallic   Secondary hyperparathyroidism of renal origin [facility])    Wears glasses     Past Surgical History:  Procedure Laterality Date   A/V FISTULAGRAM Left 09/04/2020   Procedure: A/V FISTULAGRAM;  Surgeon: Angelia Mould, MD;  Location: [place] [facility];  Service: Cardiovascular;  Laterality: Left;   AV FISTULA PLACEMENT Left 03/15/2019   Procedure: ARTERIOVENOUS (AV) FISTULA CREATION LEFT ARM;  Surgeon: Marty Heck, MD;  Location: [place];  Service: Vascular;  Laterality: Left;   COLONOSCOPY     CYSTOSCOPY W/ URETERAL STENT PLACEMENT Bilateral 12/08/2017   Procedure: CYSTOSCOPY WITH RETROGRADE PYELOGRAM/URETERAL STENT PLACEMENT;  Surgeon: Alexis Frock, MD;  Location: [facility];  Service: Urology;  Laterality: Bilateral;   CYSTOSCOPY W/ URETERAL STENT PLACEMENT Bilateral 04/20/2018   Procedure: CYSTOSCOPY WITH STENT REPLACEMENT;  Surgeon: Alexis Frock, MD;  Location: [facility];  Service: Urology;  Laterality: Bilateral;   CYSTOSCOPY W/ URETERAL STENT PLACEMENT Bilateral 08/10/2018   Procedure: CYSTOSCOPY WITH STENT REPLACEMENT, BILATERAL RETROGRADES;  Surgeon: Alexis Frock, MD;  Location: [facility];  Service: Urology;  Laterality: Bilateral;  45 MINS   CYSTOSCOPY W/ URETERAL STENT PLACEMENT Bilateral 01/18/2019   Procedure: CYSTOSCOPY WITH RETROGRADE PYELOGRAM/URETERAL STENT PLACEMENT;  Surgeon: Alexis Frock, MD;  Location: [facility];  Service: Urology;  Laterality: Bilateral;   CYSTOSCOPY W/ URETERAL STENT PLACEMENT Bilateral 05/24/2019   Procedure: CYSTOSCOPY WITH RETROGRADE PYELOGRAM/BILATERAL URETERAL STENT EXCHANGE;  Surgeon: Alexis Frock, MD;  Location: [facility];   Service: Urology;  Laterality: Bilateral;   CYSTOSCOPY W/ URETERAL STENT PLACEMENT Bilateral 09/06/2019   Procedure: CYSTOSCOPY WITH RETROGRADE PYELOGRAM/URETERAL STENT EXCHANGE;  Surgeon: Alexis Frock, MD;  Location: [facility];  Service: Urology;  Laterality: Bilateral;  45 MINS   CYSTOSCOPY W/ URETERAL STENT PLACEMENT Bilateral 05/08/2020   Procedure: CYSTOSCOPY WITH RETROGRADE PYELOGRAM/URETERAL STENT REPLACEMENT;  Surgeon: Alexis Frock, MD;  Location: [facility];  Service: Urology;  Laterality: Bilateral;   INSERTION OF DIALYSIS CATHETER Right 05/06/2019   Procedure: INSERTION OF TUNNELED  DIALYSIS CATHETER;  Surgeon: Rosetta Posner, MD;  Location: [place];  Service: Vascular;  Laterality: Right;   IR NEPHROSTOMY PLACEMENT LEFT  11/15/2017   IR NEPHROSTOMY PLACEMENT RIGHT  11/15/2017   PERIPHERAL VASCULAR INTERVENTION Left 09/04/2020   Procedure: PERIPHERAL VASCULAR INTERVENTION;  Surgeon: Angelia Mould, MD;  Location: [place] [facility];  Service: Cardiovascular;  Laterality: Left;   PROSTATE BIOPSY N/A 12/08/2017   Procedure: BIOPSY TRANSRECTAL ULTRASONIC PROSTATE (TUBP), PERINEAL BIOPSY;  Surgeon: Alexis Frock, MD;  Location[facility];  Service:  Urology;  Laterality: N/A;   TRANSURETHRAL RESECTION OF BLADDER TUMOR N/A 12/08/2017   Procedure: TRANSURETHRAL RESECTION OF BLADDER TUMOR (TURBT);  Surgeon: Alexis Frock, MD;  Location[facility];  Service: Urology;  Laterality: N/A;   TRICEPS TENDON REPAIR Bilateral 08/2005   UPPER GASTROINTESTINAL ENDOSCOPY      Family History  Problem Relation Age of Onset   Hypertension Mother    Heart disease Mother    Diabetes Sister    Diabetes Brother    Prostate cancer Maternal Grandfather    Diabetes Sister    Prostate cancer Maternal Uncle    Colon polyps Neg Hx    Esophageal cancer Neg Hx    Stomach cancer Neg Hx    Rectal cancer Neg Hx    Social History:  reports that he has been smoking cigarettes, pipe,  and cigars. He has a 3.00 pack-year smoking history. He has never used smokeless tobacco. He reports current alcohol use. He reports that he does not use drugs.  Allergies: No Known Allergies  No medications prior to admission.    No results found for this or any previous visit (from the past 48 hour(s)). No results found.  Review of Systems  Constitutional:  Negative for chills and fever.  All other systems reviewed and are negative.  Height 6\' 2"  (1.88 m), weight 126.1 kg. Physical Exam HENT:     Head: Normocephalic.     Nose: Nose normal.  Eyes:     Pupils: Pupils are equal, round, and reactive to light.  Cardiovascular:     Rate and Rhythm: Normal rate.  Pulmonary:     Effort: Pulmonary effort is normal.  Abdominal:     General: Abdomen is flat.     Comments: Stable truncal obesity.   Genitourinary:    Comments: No CVAT Musculoskeletal:     Cervical back: Normal range of motion.     Comments: Stable left arm AF graft with thrill  Neurological:     General: No focal deficit present.     Mental Status: He is alert.  Psychiatric:        Mood and Affect: Mood normal.     Assessment/Plan  Proceed as planned with cysto, BILATERAL retrogrades / stent exchanges for management of malignant hydro as he does still make some urine. Risks, benefits, alternatives, expected peri-op course discused previously and reiterated today.   Alexis Frock, MD 06/04/2021, 6:54 AM

## 2021-06-04 NOTE — Discharge Instructions (Addendum)
1 - You may have urinary urgency (bladder spasms) and bloody urine on / off with stent in place. This is normal.  2 - Call MD or go to ER for fever >102, severe pain / nausea / vomiting not relieved by medications, or acute change in medical status  Post Anesthesia Home Care Instructions  Activity: Get plenty of rest for the remainder of the day. A responsible individual must stay with you for 24 hours following the procedure.  For the next 24 hours, DO NOT: -Drive a car -Operate machinery -Drink alcoholic beverages -Take any medication unless instructed by your physician -Make any legal decisions or sign important papers.  Meals: Start with liquid foods such as gelatin or soup. Progress to regular foods as tolerated. Avoid greasy, spicy, heavy foods. If nausea and/or vomiting occur, drink only clear liquids until the nausea and/or vomiting subsides. Call your physician if vomiting continues.  Special Instructions/Symptoms: Your throat may feel dry or sore from the anesthesia or the breathing tube placed in your throat during surgery. If this causes discomfort, gargle with warm salt water. The discomfort should disappear within 24 hours.  If you had a scopolamine patch placed behind your ear for the management of post- operative nausea and/or vomiting:  1. The medication in the patch is effective for 72 hours, after which it should be removed.  Wrap patch in a tissue and discard in the trash. Wash hands thoroughly with soap and water. 2. You may remove the patch earlier than 72 hours if you experience unpleasant side effects which may include dry mouth, dizziness or visual disturbances. 3. Avoid touching the patch. Wash your hands with soap and water after contact with the patch.     

## 2021-06-05 DIAGNOSIS — D631 Anemia in chronic kidney disease: Secondary | ICD-10-CM | POA: Diagnosis not present

## 2021-06-05 DIAGNOSIS — Z992 Dependence on renal dialysis: Secondary | ICD-10-CM | POA: Diagnosis not present

## 2021-06-05 DIAGNOSIS — D509 Iron deficiency anemia, unspecified: Secondary | ICD-10-CM | POA: Diagnosis not present

## 2021-06-05 DIAGNOSIS — T7840XA Allergy, unspecified, initial encounter: Secondary | ICD-10-CM | POA: Diagnosis not present

## 2021-06-05 DIAGNOSIS — N186 End stage renal disease: Secondary | ICD-10-CM | POA: Diagnosis not present

## 2021-06-05 DIAGNOSIS — N2581 Secondary hyperparathyroidism of renal origin: Secondary | ICD-10-CM | POA: Diagnosis not present

## 2021-06-05 DIAGNOSIS — D689 Coagulation defect, unspecified: Secondary | ICD-10-CM | POA: Diagnosis not present

## 2021-06-05 DIAGNOSIS — R52 Pain, unspecified: Secondary | ICD-10-CM | POA: Diagnosis not present

## 2021-06-07 ENCOUNTER — Encounter (HOSPITAL_BASED_OUTPATIENT_CLINIC_OR_DEPARTMENT_OTHER): Payer: Self-pay | Admitting: Urology

## 2021-06-07 DIAGNOSIS — Z992 Dependence on renal dialysis: Secondary | ICD-10-CM | POA: Diagnosis not present

## 2021-06-07 DIAGNOSIS — N2581 Secondary hyperparathyroidism of renal origin: Secondary | ICD-10-CM | POA: Diagnosis not present

## 2021-06-07 DIAGNOSIS — R52 Pain, unspecified: Secondary | ICD-10-CM | POA: Diagnosis not present

## 2021-06-07 DIAGNOSIS — N186 End stage renal disease: Secondary | ICD-10-CM | POA: Diagnosis not present

## 2021-06-07 DIAGNOSIS — D631 Anemia in chronic kidney disease: Secondary | ICD-10-CM | POA: Diagnosis not present

## 2021-06-07 DIAGNOSIS — D509 Iron deficiency anemia, unspecified: Secondary | ICD-10-CM | POA: Diagnosis not present

## 2021-06-07 DIAGNOSIS — D689 Coagulation defect, unspecified: Secondary | ICD-10-CM | POA: Diagnosis not present

## 2021-06-07 DIAGNOSIS — T7840XA Allergy, unspecified, initial encounter: Secondary | ICD-10-CM | POA: Diagnosis not present

## 2021-06-09 DIAGNOSIS — T7840XA Allergy, unspecified, initial encounter: Secondary | ICD-10-CM | POA: Diagnosis not present

## 2021-06-09 DIAGNOSIS — D509 Iron deficiency anemia, unspecified: Secondary | ICD-10-CM | POA: Diagnosis not present

## 2021-06-09 DIAGNOSIS — D631 Anemia in chronic kidney disease: Secondary | ICD-10-CM | POA: Diagnosis not present

## 2021-06-09 DIAGNOSIS — N186 End stage renal disease: Secondary | ICD-10-CM | POA: Diagnosis not present

## 2021-06-09 DIAGNOSIS — D689 Coagulation defect, unspecified: Secondary | ICD-10-CM | POA: Diagnosis not present

## 2021-06-09 DIAGNOSIS — R52 Pain, unspecified: Secondary | ICD-10-CM | POA: Diagnosis not present

## 2021-06-09 DIAGNOSIS — N2581 Secondary hyperparathyroidism of renal origin: Secondary | ICD-10-CM | POA: Diagnosis not present

## 2021-06-09 DIAGNOSIS — Z992 Dependence on renal dialysis: Secondary | ICD-10-CM | POA: Diagnosis not present

## 2021-06-11 DIAGNOSIS — N186 End stage renal disease: Secondary | ICD-10-CM | POA: Diagnosis not present

## 2021-06-11 DIAGNOSIS — Z992 Dependence on renal dialysis: Secondary | ICD-10-CM | POA: Diagnosis not present

## 2021-06-11 DIAGNOSIS — D509 Iron deficiency anemia, unspecified: Secondary | ICD-10-CM | POA: Diagnosis not present

## 2021-06-11 DIAGNOSIS — R52 Pain, unspecified: Secondary | ICD-10-CM | POA: Diagnosis not present

## 2021-06-11 DIAGNOSIS — D631 Anemia in chronic kidney disease: Secondary | ICD-10-CM | POA: Diagnosis not present

## 2021-06-11 DIAGNOSIS — N2581 Secondary hyperparathyroidism of renal origin: Secondary | ICD-10-CM | POA: Diagnosis not present

## 2021-06-11 DIAGNOSIS — D689 Coagulation defect, unspecified: Secondary | ICD-10-CM | POA: Diagnosis not present

## 2021-06-11 DIAGNOSIS — T7840XA Allergy, unspecified, initial encounter: Secondary | ICD-10-CM | POA: Diagnosis not present

## 2021-06-14 DIAGNOSIS — N186 End stage renal disease: Secondary | ICD-10-CM | POA: Diagnosis not present

## 2021-06-14 DIAGNOSIS — D631 Anemia in chronic kidney disease: Secondary | ICD-10-CM | POA: Diagnosis not present

## 2021-06-14 DIAGNOSIS — T7840XA Allergy, unspecified, initial encounter: Secondary | ICD-10-CM | POA: Diagnosis not present

## 2021-06-14 DIAGNOSIS — R52 Pain, unspecified: Secondary | ICD-10-CM | POA: Diagnosis not present

## 2021-06-14 DIAGNOSIS — D509 Iron deficiency anemia, unspecified: Secondary | ICD-10-CM | POA: Diagnosis not present

## 2021-06-14 DIAGNOSIS — N2581 Secondary hyperparathyroidism of renal origin: Secondary | ICD-10-CM | POA: Diagnosis not present

## 2021-06-14 DIAGNOSIS — Z992 Dependence on renal dialysis: Secondary | ICD-10-CM | POA: Diagnosis not present

## 2021-06-14 DIAGNOSIS — D689 Coagulation defect, unspecified: Secondary | ICD-10-CM | POA: Diagnosis not present

## 2021-06-16 DIAGNOSIS — T7840XA Allergy, unspecified, initial encounter: Secondary | ICD-10-CM | POA: Diagnosis not present

## 2021-06-16 DIAGNOSIS — R52 Pain, unspecified: Secondary | ICD-10-CM | POA: Diagnosis not present

## 2021-06-16 DIAGNOSIS — Z992 Dependence on renal dialysis: Secondary | ICD-10-CM | POA: Diagnosis not present

## 2021-06-16 DIAGNOSIS — D631 Anemia in chronic kidney disease: Secondary | ICD-10-CM | POA: Diagnosis not present

## 2021-06-16 DIAGNOSIS — D689 Coagulation defect, unspecified: Secondary | ICD-10-CM | POA: Diagnosis not present

## 2021-06-16 DIAGNOSIS — N186 End stage renal disease: Secondary | ICD-10-CM | POA: Diagnosis not present

## 2021-06-16 DIAGNOSIS — D509 Iron deficiency anemia, unspecified: Secondary | ICD-10-CM | POA: Diagnosis not present

## 2021-06-16 DIAGNOSIS — N2581 Secondary hyperparathyroidism of renal origin: Secondary | ICD-10-CM | POA: Diagnosis not present

## 2021-06-18 DIAGNOSIS — N2581 Secondary hyperparathyroidism of renal origin: Secondary | ICD-10-CM | POA: Diagnosis not present

## 2021-06-18 DIAGNOSIS — R52 Pain, unspecified: Secondary | ICD-10-CM | POA: Diagnosis not present

## 2021-06-18 DIAGNOSIS — Z992 Dependence on renal dialysis: Secondary | ICD-10-CM | POA: Diagnosis not present

## 2021-06-18 DIAGNOSIS — D631 Anemia in chronic kidney disease: Secondary | ICD-10-CM | POA: Diagnosis not present

## 2021-06-18 DIAGNOSIS — D689 Coagulation defect, unspecified: Secondary | ICD-10-CM | POA: Diagnosis not present

## 2021-06-18 DIAGNOSIS — T7840XA Allergy, unspecified, initial encounter: Secondary | ICD-10-CM | POA: Diagnosis not present

## 2021-06-18 DIAGNOSIS — D509 Iron deficiency anemia, unspecified: Secondary | ICD-10-CM | POA: Diagnosis not present

## 2021-06-18 DIAGNOSIS — N186 End stage renal disease: Secondary | ICD-10-CM | POA: Diagnosis not present

## 2021-06-21 DIAGNOSIS — T7840XA Allergy, unspecified, initial encounter: Secondary | ICD-10-CM | POA: Diagnosis not present

## 2021-06-21 DIAGNOSIS — D689 Coagulation defect, unspecified: Secondary | ICD-10-CM | POA: Diagnosis not present

## 2021-06-21 DIAGNOSIS — R52 Pain, unspecified: Secondary | ICD-10-CM | POA: Diagnosis not present

## 2021-06-21 DIAGNOSIS — N186 End stage renal disease: Secondary | ICD-10-CM | POA: Diagnosis not present

## 2021-06-21 DIAGNOSIS — D631 Anemia in chronic kidney disease: Secondary | ICD-10-CM | POA: Diagnosis not present

## 2021-06-21 DIAGNOSIS — N2581 Secondary hyperparathyroidism of renal origin: Secondary | ICD-10-CM | POA: Diagnosis not present

## 2021-06-21 DIAGNOSIS — Z992 Dependence on renal dialysis: Secondary | ICD-10-CM | POA: Diagnosis not present

## 2021-06-21 DIAGNOSIS — D509 Iron deficiency anemia, unspecified: Secondary | ICD-10-CM | POA: Diagnosis not present

## 2021-06-23 DIAGNOSIS — R52 Pain, unspecified: Secondary | ICD-10-CM | POA: Diagnosis not present

## 2021-06-23 DIAGNOSIS — D631 Anemia in chronic kidney disease: Secondary | ICD-10-CM | POA: Diagnosis not present

## 2021-06-23 DIAGNOSIS — T7840XA Allergy, unspecified, initial encounter: Secondary | ICD-10-CM | POA: Diagnosis not present

## 2021-06-23 DIAGNOSIS — D689 Coagulation defect, unspecified: Secondary | ICD-10-CM | POA: Diagnosis not present

## 2021-06-23 DIAGNOSIS — D509 Iron deficiency anemia, unspecified: Secondary | ICD-10-CM | POA: Diagnosis not present

## 2021-06-23 DIAGNOSIS — N186 End stage renal disease: Secondary | ICD-10-CM | POA: Diagnosis not present

## 2021-06-23 DIAGNOSIS — N2581 Secondary hyperparathyroidism of renal origin: Secondary | ICD-10-CM | POA: Diagnosis not present

## 2021-06-23 DIAGNOSIS — Z992 Dependence on renal dialysis: Secondary | ICD-10-CM | POA: Diagnosis not present

## 2021-06-25 DIAGNOSIS — N186 End stage renal disease: Secondary | ICD-10-CM | POA: Diagnosis not present

## 2021-06-25 DIAGNOSIS — D689 Coagulation defect, unspecified: Secondary | ICD-10-CM | POA: Diagnosis not present

## 2021-06-25 DIAGNOSIS — D631 Anemia in chronic kidney disease: Secondary | ICD-10-CM | POA: Diagnosis not present

## 2021-06-25 DIAGNOSIS — N2581 Secondary hyperparathyroidism of renal origin: Secondary | ICD-10-CM | POA: Diagnosis not present

## 2021-06-25 DIAGNOSIS — Z992 Dependence on renal dialysis: Secondary | ICD-10-CM | POA: Diagnosis not present

## 2021-06-25 DIAGNOSIS — R52 Pain, unspecified: Secondary | ICD-10-CM | POA: Diagnosis not present

## 2021-06-25 DIAGNOSIS — D509 Iron deficiency anemia, unspecified: Secondary | ICD-10-CM | POA: Diagnosis not present

## 2021-06-25 DIAGNOSIS — T7840XA Allergy, unspecified, initial encounter: Secondary | ICD-10-CM | POA: Diagnosis not present

## 2021-06-28 DIAGNOSIS — N186 End stage renal disease: Secondary | ICD-10-CM | POA: Diagnosis not present

## 2021-06-28 DIAGNOSIS — R52 Pain, unspecified: Secondary | ICD-10-CM | POA: Diagnosis not present

## 2021-06-28 DIAGNOSIS — Z992 Dependence on renal dialysis: Secondary | ICD-10-CM | POA: Diagnosis not present

## 2021-06-28 DIAGNOSIS — D689 Coagulation defect, unspecified: Secondary | ICD-10-CM | POA: Diagnosis not present

## 2021-06-28 DIAGNOSIS — N2581 Secondary hyperparathyroidism of renal origin: Secondary | ICD-10-CM | POA: Diagnosis not present

## 2021-06-28 DIAGNOSIS — D631 Anemia in chronic kidney disease: Secondary | ICD-10-CM | POA: Diagnosis not present

## 2021-06-28 DIAGNOSIS — D509 Iron deficiency anemia, unspecified: Secondary | ICD-10-CM | POA: Diagnosis not present

## 2021-06-28 DIAGNOSIS — T7840XA Allergy, unspecified, initial encounter: Secondary | ICD-10-CM | POA: Diagnosis not present

## 2021-06-30 DIAGNOSIS — D689 Coagulation defect, unspecified: Secondary | ICD-10-CM | POA: Diagnosis not present

## 2021-06-30 DIAGNOSIS — N2581 Secondary hyperparathyroidism of renal origin: Secondary | ICD-10-CM | POA: Diagnosis not present

## 2021-06-30 DIAGNOSIS — R52 Pain, unspecified: Secondary | ICD-10-CM | POA: Diagnosis not present

## 2021-06-30 DIAGNOSIS — D509 Iron deficiency anemia, unspecified: Secondary | ICD-10-CM | POA: Diagnosis not present

## 2021-06-30 DIAGNOSIS — Z992 Dependence on renal dialysis: Secondary | ICD-10-CM | POA: Diagnosis not present

## 2021-06-30 DIAGNOSIS — D631 Anemia in chronic kidney disease: Secondary | ICD-10-CM | POA: Diagnosis not present

## 2021-06-30 DIAGNOSIS — T7840XA Allergy, unspecified, initial encounter: Secondary | ICD-10-CM | POA: Diagnosis not present

## 2021-06-30 DIAGNOSIS — N186 End stage renal disease: Secondary | ICD-10-CM | POA: Diagnosis not present

## 2021-07-02 DIAGNOSIS — N186 End stage renal disease: Secondary | ICD-10-CM | POA: Diagnosis not present

## 2021-07-02 DIAGNOSIS — R52 Pain, unspecified: Secondary | ICD-10-CM | POA: Diagnosis not present

## 2021-07-02 DIAGNOSIS — N2581 Secondary hyperparathyroidism of renal origin: Secondary | ICD-10-CM | POA: Diagnosis not present

## 2021-07-02 DIAGNOSIS — T7840XA Allergy, unspecified, initial encounter: Secondary | ICD-10-CM | POA: Diagnosis not present

## 2021-07-02 DIAGNOSIS — D631 Anemia in chronic kidney disease: Secondary | ICD-10-CM | POA: Diagnosis not present

## 2021-07-02 DIAGNOSIS — D509 Iron deficiency anemia, unspecified: Secondary | ICD-10-CM | POA: Diagnosis not present

## 2021-07-02 DIAGNOSIS — Z992 Dependence on renal dialysis: Secondary | ICD-10-CM | POA: Diagnosis not present

## 2021-07-02 DIAGNOSIS — D689 Coagulation defect, unspecified: Secondary | ICD-10-CM | POA: Diagnosis not present

## 2021-07-03 DIAGNOSIS — N186 End stage renal disease: Secondary | ICD-10-CM | POA: Diagnosis not present

## 2021-07-03 DIAGNOSIS — N138 Other obstructive and reflux uropathy: Secondary | ICD-10-CM | POA: Diagnosis not present

## 2021-07-03 DIAGNOSIS — Z992 Dependence on renal dialysis: Secondary | ICD-10-CM | POA: Diagnosis not present

## 2021-07-05 DIAGNOSIS — T7840XA Allergy, unspecified, initial encounter: Secondary | ICD-10-CM | POA: Diagnosis not present

## 2021-07-05 DIAGNOSIS — N186 End stage renal disease: Secondary | ICD-10-CM | POA: Diagnosis not present

## 2021-07-05 DIAGNOSIS — R52 Pain, unspecified: Secondary | ICD-10-CM | POA: Diagnosis not present

## 2021-07-05 DIAGNOSIS — D631 Anemia in chronic kidney disease: Secondary | ICD-10-CM | POA: Diagnosis not present

## 2021-07-05 DIAGNOSIS — R197 Diarrhea, unspecified: Secondary | ICD-10-CM | POA: Diagnosis not present

## 2021-07-05 DIAGNOSIS — D689 Coagulation defect, unspecified: Secondary | ICD-10-CM | POA: Diagnosis not present

## 2021-07-05 DIAGNOSIS — N2581 Secondary hyperparathyroidism of renal origin: Secondary | ICD-10-CM | POA: Diagnosis not present

## 2021-07-05 DIAGNOSIS — Z992 Dependence on renal dialysis: Secondary | ICD-10-CM | POA: Diagnosis not present

## 2021-07-07 DIAGNOSIS — R52 Pain, unspecified: Secondary | ICD-10-CM | POA: Diagnosis not present

## 2021-07-07 DIAGNOSIS — R197 Diarrhea, unspecified: Secondary | ICD-10-CM | POA: Diagnosis not present

## 2021-07-07 DIAGNOSIS — Z992 Dependence on renal dialysis: Secondary | ICD-10-CM | POA: Diagnosis not present

## 2021-07-07 DIAGNOSIS — N186 End stage renal disease: Secondary | ICD-10-CM | POA: Diagnosis not present

## 2021-07-07 DIAGNOSIS — N2581 Secondary hyperparathyroidism of renal origin: Secondary | ICD-10-CM | POA: Diagnosis not present

## 2021-07-07 DIAGNOSIS — D631 Anemia in chronic kidney disease: Secondary | ICD-10-CM | POA: Diagnosis not present

## 2021-07-07 DIAGNOSIS — T7840XA Allergy, unspecified, initial encounter: Secondary | ICD-10-CM | POA: Diagnosis not present

## 2021-07-07 DIAGNOSIS — D689 Coagulation defect, unspecified: Secondary | ICD-10-CM | POA: Diagnosis not present

## 2021-07-09 DIAGNOSIS — R52 Pain, unspecified: Secondary | ICD-10-CM | POA: Diagnosis not present

## 2021-07-09 DIAGNOSIS — D631 Anemia in chronic kidney disease: Secondary | ICD-10-CM | POA: Diagnosis not present

## 2021-07-09 DIAGNOSIS — R197 Diarrhea, unspecified: Secondary | ICD-10-CM | POA: Diagnosis not present

## 2021-07-09 DIAGNOSIS — Z992 Dependence on renal dialysis: Secondary | ICD-10-CM | POA: Diagnosis not present

## 2021-07-09 DIAGNOSIS — N2581 Secondary hyperparathyroidism of renal origin: Secondary | ICD-10-CM | POA: Diagnosis not present

## 2021-07-09 DIAGNOSIS — D689 Coagulation defect, unspecified: Secondary | ICD-10-CM | POA: Diagnosis not present

## 2021-07-09 DIAGNOSIS — T7840XA Allergy, unspecified, initial encounter: Secondary | ICD-10-CM | POA: Diagnosis not present

## 2021-07-09 DIAGNOSIS — N186 End stage renal disease: Secondary | ICD-10-CM | POA: Diagnosis not present

## 2021-07-12 DIAGNOSIS — N186 End stage renal disease: Secondary | ICD-10-CM | POA: Diagnosis not present

## 2021-07-12 DIAGNOSIS — N2581 Secondary hyperparathyroidism of renal origin: Secondary | ICD-10-CM | POA: Diagnosis not present

## 2021-07-12 DIAGNOSIS — D631 Anemia in chronic kidney disease: Secondary | ICD-10-CM | POA: Diagnosis not present

## 2021-07-12 DIAGNOSIS — R52 Pain, unspecified: Secondary | ICD-10-CM | POA: Diagnosis not present

## 2021-07-12 DIAGNOSIS — D689 Coagulation defect, unspecified: Secondary | ICD-10-CM | POA: Diagnosis not present

## 2021-07-12 DIAGNOSIS — R197 Diarrhea, unspecified: Secondary | ICD-10-CM | POA: Diagnosis not present

## 2021-07-12 DIAGNOSIS — T7840XA Allergy, unspecified, initial encounter: Secondary | ICD-10-CM | POA: Diagnosis not present

## 2021-07-12 DIAGNOSIS — Z992 Dependence on renal dialysis: Secondary | ICD-10-CM | POA: Diagnosis not present

## 2021-07-14 DIAGNOSIS — N2581 Secondary hyperparathyroidism of renal origin: Secondary | ICD-10-CM | POA: Diagnosis not present

## 2021-07-14 DIAGNOSIS — R197 Diarrhea, unspecified: Secondary | ICD-10-CM | POA: Diagnosis not present

## 2021-07-14 DIAGNOSIS — T7840XA Allergy, unspecified, initial encounter: Secondary | ICD-10-CM | POA: Diagnosis not present

## 2021-07-14 DIAGNOSIS — D631 Anemia in chronic kidney disease: Secondary | ICD-10-CM | POA: Diagnosis not present

## 2021-07-14 DIAGNOSIS — N186 End stage renal disease: Secondary | ICD-10-CM | POA: Diagnosis not present

## 2021-07-14 DIAGNOSIS — R52 Pain, unspecified: Secondary | ICD-10-CM | POA: Diagnosis not present

## 2021-07-14 DIAGNOSIS — Z992 Dependence on renal dialysis: Secondary | ICD-10-CM | POA: Diagnosis not present

## 2021-07-14 DIAGNOSIS — D689 Coagulation defect, unspecified: Secondary | ICD-10-CM | POA: Diagnosis not present

## 2021-07-16 DIAGNOSIS — Z992 Dependence on renal dialysis: Secondary | ICD-10-CM | POA: Diagnosis not present

## 2021-07-16 DIAGNOSIS — R52 Pain, unspecified: Secondary | ICD-10-CM | POA: Diagnosis not present

## 2021-07-16 DIAGNOSIS — R197 Diarrhea, unspecified: Secondary | ICD-10-CM | POA: Diagnosis not present

## 2021-07-16 DIAGNOSIS — D631 Anemia in chronic kidney disease: Secondary | ICD-10-CM | POA: Diagnosis not present

## 2021-07-16 DIAGNOSIS — T7840XA Allergy, unspecified, initial encounter: Secondary | ICD-10-CM | POA: Diagnosis not present

## 2021-07-16 DIAGNOSIS — N2581 Secondary hyperparathyroidism of renal origin: Secondary | ICD-10-CM | POA: Diagnosis not present

## 2021-07-16 DIAGNOSIS — N186 End stage renal disease: Secondary | ICD-10-CM | POA: Diagnosis not present

## 2021-07-16 DIAGNOSIS — D689 Coagulation defect, unspecified: Secondary | ICD-10-CM | POA: Diagnosis not present

## 2021-07-19 DIAGNOSIS — N2581 Secondary hyperparathyroidism of renal origin: Secondary | ICD-10-CM | POA: Diagnosis not present

## 2021-07-19 DIAGNOSIS — T7840XA Allergy, unspecified, initial encounter: Secondary | ICD-10-CM | POA: Diagnosis not present

## 2021-07-19 DIAGNOSIS — D689 Coagulation defect, unspecified: Secondary | ICD-10-CM | POA: Diagnosis not present

## 2021-07-19 DIAGNOSIS — D631 Anemia in chronic kidney disease: Secondary | ICD-10-CM | POA: Diagnosis not present

## 2021-07-19 DIAGNOSIS — N186 End stage renal disease: Secondary | ICD-10-CM | POA: Diagnosis not present

## 2021-07-19 DIAGNOSIS — R52 Pain, unspecified: Secondary | ICD-10-CM | POA: Diagnosis not present

## 2021-07-19 DIAGNOSIS — Z992 Dependence on renal dialysis: Secondary | ICD-10-CM | POA: Diagnosis not present

## 2021-07-19 DIAGNOSIS — R197 Diarrhea, unspecified: Secondary | ICD-10-CM | POA: Diagnosis not present

## 2021-07-21 DIAGNOSIS — N2581 Secondary hyperparathyroidism of renal origin: Secondary | ICD-10-CM | POA: Diagnosis not present

## 2021-07-21 DIAGNOSIS — T7840XA Allergy, unspecified, initial encounter: Secondary | ICD-10-CM | POA: Diagnosis not present

## 2021-07-21 DIAGNOSIS — N186 End stage renal disease: Secondary | ICD-10-CM | POA: Diagnosis not present

## 2021-07-21 DIAGNOSIS — Z992 Dependence on renal dialysis: Secondary | ICD-10-CM | POA: Diagnosis not present

## 2021-07-21 DIAGNOSIS — D631 Anemia in chronic kidney disease: Secondary | ICD-10-CM | POA: Diagnosis not present

## 2021-07-21 DIAGNOSIS — R52 Pain, unspecified: Secondary | ICD-10-CM | POA: Diagnosis not present

## 2021-07-21 DIAGNOSIS — R197 Diarrhea, unspecified: Secondary | ICD-10-CM | POA: Diagnosis not present

## 2021-07-21 DIAGNOSIS — D689 Coagulation defect, unspecified: Secondary | ICD-10-CM | POA: Diagnosis not present

## 2021-07-23 DIAGNOSIS — N2581 Secondary hyperparathyroidism of renal origin: Secondary | ICD-10-CM | POA: Diagnosis not present

## 2021-07-23 DIAGNOSIS — T7840XA Allergy, unspecified, initial encounter: Secondary | ICD-10-CM | POA: Diagnosis not present

## 2021-07-23 DIAGNOSIS — D631 Anemia in chronic kidney disease: Secondary | ICD-10-CM | POA: Diagnosis not present

## 2021-07-23 DIAGNOSIS — R52 Pain, unspecified: Secondary | ICD-10-CM | POA: Diagnosis not present

## 2021-07-23 DIAGNOSIS — D689 Coagulation defect, unspecified: Secondary | ICD-10-CM | POA: Diagnosis not present

## 2021-07-23 DIAGNOSIS — Z992 Dependence on renal dialysis: Secondary | ICD-10-CM | POA: Diagnosis not present

## 2021-07-23 DIAGNOSIS — R197 Diarrhea, unspecified: Secondary | ICD-10-CM | POA: Diagnosis not present

## 2021-07-23 DIAGNOSIS — N186 End stage renal disease: Secondary | ICD-10-CM | POA: Diagnosis not present

## 2021-07-26 DIAGNOSIS — N2581 Secondary hyperparathyroidism of renal origin: Secondary | ICD-10-CM | POA: Diagnosis not present

## 2021-07-26 DIAGNOSIS — D689 Coagulation defect, unspecified: Secondary | ICD-10-CM | POA: Diagnosis not present

## 2021-07-26 DIAGNOSIS — N186 End stage renal disease: Secondary | ICD-10-CM | POA: Diagnosis not present

## 2021-07-26 DIAGNOSIS — T7840XA Allergy, unspecified, initial encounter: Secondary | ICD-10-CM | POA: Diagnosis not present

## 2021-07-26 DIAGNOSIS — D631 Anemia in chronic kidney disease: Secondary | ICD-10-CM | POA: Diagnosis not present

## 2021-07-26 DIAGNOSIS — R197 Diarrhea, unspecified: Secondary | ICD-10-CM | POA: Diagnosis not present

## 2021-07-26 DIAGNOSIS — R52 Pain, unspecified: Secondary | ICD-10-CM | POA: Diagnosis not present

## 2021-07-26 DIAGNOSIS — Z992 Dependence on renal dialysis: Secondary | ICD-10-CM | POA: Diagnosis not present

## 2021-07-28 DIAGNOSIS — Z992 Dependence on renal dialysis: Secondary | ICD-10-CM | POA: Diagnosis not present

## 2021-07-28 DIAGNOSIS — R197 Diarrhea, unspecified: Secondary | ICD-10-CM | POA: Diagnosis not present

## 2021-07-28 DIAGNOSIS — N186 End stage renal disease: Secondary | ICD-10-CM | POA: Diagnosis not present

## 2021-07-28 DIAGNOSIS — R52 Pain, unspecified: Secondary | ICD-10-CM | POA: Diagnosis not present

## 2021-07-28 DIAGNOSIS — D689 Coagulation defect, unspecified: Secondary | ICD-10-CM | POA: Diagnosis not present

## 2021-07-28 DIAGNOSIS — T7840XA Allergy, unspecified, initial encounter: Secondary | ICD-10-CM | POA: Diagnosis not present

## 2021-07-28 DIAGNOSIS — D631 Anemia in chronic kidney disease: Secondary | ICD-10-CM | POA: Diagnosis not present

## 2021-07-28 DIAGNOSIS — N2581 Secondary hyperparathyroidism of renal origin: Secondary | ICD-10-CM | POA: Diagnosis not present

## 2021-07-29 DIAGNOSIS — N186 End stage renal disease: Secondary | ICD-10-CM | POA: Diagnosis not present

## 2021-07-29 DIAGNOSIS — C7951 Secondary malignant neoplasm of bone: Secondary | ICD-10-CM | POA: Diagnosis not present

## 2021-07-29 DIAGNOSIS — N130 Hydronephrosis with ureteropelvic junction obstruction: Secondary | ICD-10-CM | POA: Diagnosis not present

## 2021-07-30 DIAGNOSIS — R52 Pain, unspecified: Secondary | ICD-10-CM | POA: Diagnosis not present

## 2021-07-30 DIAGNOSIS — T7840XA Allergy, unspecified, initial encounter: Secondary | ICD-10-CM | POA: Diagnosis not present

## 2021-07-30 DIAGNOSIS — N2581 Secondary hyperparathyroidism of renal origin: Secondary | ICD-10-CM | POA: Diagnosis not present

## 2021-07-30 DIAGNOSIS — R197 Diarrhea, unspecified: Secondary | ICD-10-CM | POA: Diagnosis not present

## 2021-07-30 DIAGNOSIS — D631 Anemia in chronic kidney disease: Secondary | ICD-10-CM | POA: Diagnosis not present

## 2021-07-30 DIAGNOSIS — D689 Coagulation defect, unspecified: Secondary | ICD-10-CM | POA: Diagnosis not present

## 2021-07-30 DIAGNOSIS — Z992 Dependence on renal dialysis: Secondary | ICD-10-CM | POA: Diagnosis not present

## 2021-07-30 DIAGNOSIS — N186 End stage renal disease: Secondary | ICD-10-CM | POA: Diagnosis not present

## 2021-08-02 DIAGNOSIS — D689 Coagulation defect, unspecified: Secondary | ICD-10-CM | POA: Diagnosis not present

## 2021-08-02 DIAGNOSIS — D631 Anemia in chronic kidney disease: Secondary | ICD-10-CM | POA: Diagnosis not present

## 2021-08-02 DIAGNOSIS — Z992 Dependence on renal dialysis: Secondary | ICD-10-CM | POA: Diagnosis not present

## 2021-08-02 DIAGNOSIS — R197 Diarrhea, unspecified: Secondary | ICD-10-CM | POA: Diagnosis not present

## 2021-08-02 DIAGNOSIS — N2581 Secondary hyperparathyroidism of renal origin: Secondary | ICD-10-CM | POA: Diagnosis not present

## 2021-08-02 DIAGNOSIS — T7840XA Allergy, unspecified, initial encounter: Secondary | ICD-10-CM | POA: Diagnosis not present

## 2021-08-02 DIAGNOSIS — R52 Pain, unspecified: Secondary | ICD-10-CM | POA: Diagnosis not present

## 2021-08-02 DIAGNOSIS — N186 End stage renal disease: Secondary | ICD-10-CM | POA: Diagnosis not present

## 2021-08-03 DIAGNOSIS — Z992 Dependence on renal dialysis: Secondary | ICD-10-CM | POA: Diagnosis not present

## 2021-08-03 DIAGNOSIS — N186 End stage renal disease: Secondary | ICD-10-CM | POA: Diagnosis not present

## 2021-08-03 DIAGNOSIS — N138 Other obstructive and reflux uropathy: Secondary | ICD-10-CM | POA: Diagnosis not present

## 2021-08-04 DIAGNOSIS — R52 Pain, unspecified: Secondary | ICD-10-CM | POA: Diagnosis not present

## 2021-08-04 DIAGNOSIS — D509 Iron deficiency anemia, unspecified: Secondary | ICD-10-CM | POA: Diagnosis not present

## 2021-08-04 DIAGNOSIS — D631 Anemia in chronic kidney disease: Secondary | ICD-10-CM | POA: Diagnosis not present

## 2021-08-04 DIAGNOSIS — N2581 Secondary hyperparathyroidism of renal origin: Secondary | ICD-10-CM | POA: Diagnosis not present

## 2021-08-04 DIAGNOSIS — N186 End stage renal disease: Secondary | ICD-10-CM | POA: Diagnosis not present

## 2021-08-04 DIAGNOSIS — T7840XA Allergy, unspecified, initial encounter: Secondary | ICD-10-CM | POA: Diagnosis not present

## 2021-08-04 DIAGNOSIS — Z992 Dependence on renal dialysis: Secondary | ICD-10-CM | POA: Diagnosis not present

## 2021-08-04 DIAGNOSIS — D689 Coagulation defect, unspecified: Secondary | ICD-10-CM | POA: Diagnosis not present

## 2021-08-05 ENCOUNTER — Encounter (HOSPITAL_COMMUNITY): Payer: Self-pay

## 2021-08-06 DIAGNOSIS — D631 Anemia in chronic kidney disease: Secondary | ICD-10-CM | POA: Diagnosis not present

## 2021-08-06 DIAGNOSIS — R52 Pain, unspecified: Secondary | ICD-10-CM | POA: Diagnosis not present

## 2021-08-06 DIAGNOSIS — Z992 Dependence on renal dialysis: Secondary | ICD-10-CM | POA: Diagnosis not present

## 2021-08-06 DIAGNOSIS — T7840XA Allergy, unspecified, initial encounter: Secondary | ICD-10-CM | POA: Diagnosis not present

## 2021-08-06 DIAGNOSIS — N186 End stage renal disease: Secondary | ICD-10-CM | POA: Diagnosis not present

## 2021-08-06 DIAGNOSIS — N2581 Secondary hyperparathyroidism of renal origin: Secondary | ICD-10-CM | POA: Diagnosis not present

## 2021-08-06 DIAGNOSIS — D689 Coagulation defect, unspecified: Secondary | ICD-10-CM | POA: Diagnosis not present

## 2021-08-06 DIAGNOSIS — D509 Iron deficiency anemia, unspecified: Secondary | ICD-10-CM | POA: Diagnosis not present

## 2021-08-09 DIAGNOSIS — D509 Iron deficiency anemia, unspecified: Secondary | ICD-10-CM | POA: Diagnosis not present

## 2021-08-09 DIAGNOSIS — R52 Pain, unspecified: Secondary | ICD-10-CM | POA: Diagnosis not present

## 2021-08-09 DIAGNOSIS — T7840XA Allergy, unspecified, initial encounter: Secondary | ICD-10-CM | POA: Diagnosis not present

## 2021-08-09 DIAGNOSIS — D631 Anemia in chronic kidney disease: Secondary | ICD-10-CM | POA: Diagnosis not present

## 2021-08-09 DIAGNOSIS — D689 Coagulation defect, unspecified: Secondary | ICD-10-CM | POA: Diagnosis not present

## 2021-08-09 DIAGNOSIS — N186 End stage renal disease: Secondary | ICD-10-CM | POA: Diagnosis not present

## 2021-08-09 DIAGNOSIS — N2581 Secondary hyperparathyroidism of renal origin: Secondary | ICD-10-CM | POA: Diagnosis not present

## 2021-08-09 DIAGNOSIS — Z992 Dependence on renal dialysis: Secondary | ICD-10-CM | POA: Diagnosis not present

## 2021-08-11 DIAGNOSIS — N2581 Secondary hyperparathyroidism of renal origin: Secondary | ICD-10-CM | POA: Diagnosis not present

## 2021-08-11 DIAGNOSIS — D631 Anemia in chronic kidney disease: Secondary | ICD-10-CM | POA: Diagnosis not present

## 2021-08-11 DIAGNOSIS — D689 Coagulation defect, unspecified: Secondary | ICD-10-CM | POA: Diagnosis not present

## 2021-08-11 DIAGNOSIS — Z992 Dependence on renal dialysis: Secondary | ICD-10-CM | POA: Diagnosis not present

## 2021-08-11 DIAGNOSIS — D509 Iron deficiency anemia, unspecified: Secondary | ICD-10-CM | POA: Diagnosis not present

## 2021-08-11 DIAGNOSIS — T7840XA Allergy, unspecified, initial encounter: Secondary | ICD-10-CM | POA: Diagnosis not present

## 2021-08-11 DIAGNOSIS — N186 End stage renal disease: Secondary | ICD-10-CM | POA: Diagnosis not present

## 2021-08-11 DIAGNOSIS — R52 Pain, unspecified: Secondary | ICD-10-CM | POA: Diagnosis not present

## 2021-08-12 ENCOUNTER — Encounter: Payer: Self-pay | Admitting: Family Medicine

## 2021-08-12 ENCOUNTER — Encounter (HOSPITAL_COMMUNITY): Payer: Self-pay

## 2021-08-12 ENCOUNTER — Ambulatory Visit (INDEPENDENT_AMBULATORY_CARE_PROVIDER_SITE_OTHER): Payer: Medicare Other | Admitting: Family Medicine

## 2021-08-12 ENCOUNTER — Other Ambulatory Visit: Payer: Self-pay

## 2021-08-12 VITALS — BP 148/90 | HR 81 | Temp 97.5°F | Wt 286.2 lb

## 2021-08-12 DIAGNOSIS — N186 End stage renal disease: Secondary | ICD-10-CM

## 2021-08-12 DIAGNOSIS — Z992 Dependence on renal dialysis: Secondary | ICD-10-CM | POA: Diagnosis not present

## 2021-08-12 DIAGNOSIS — J209 Acute bronchitis, unspecified: Secondary | ICD-10-CM

## 2021-08-12 DIAGNOSIS — J301 Allergic rhinitis due to pollen: Secondary | ICD-10-CM | POA: Diagnosis not present

## 2021-08-12 DIAGNOSIS — R062 Wheezing: Secondary | ICD-10-CM | POA: Diagnosis not present

## 2021-08-12 MED ORDER — AZITHROMYCIN 500 MG PO TABS: 500.0000 mg | ORAL_TABLET | Freq: Every day | ORAL | 0 refills | Status: DC

## 2021-08-12 NOTE — Progress Notes (Signed)
° °  Subjective:    Patient ID: JOSEY FORCIER, male    DOB: 21-Sep-1960, 61 y.o.   MRN: [id number]  HPI He complains of a 1 month history of sneezing, rhinorrhea, ear congestion, chest congestion and occasional wheezing but no fever, chills, shortness of breath.  He does have underlying allergies and has had difficulty with wheezing in the past.  He continues dialysis.  He does have several questions concerning procedures and protocols at the dialysis unit in regard to COVID, security issues and proper use of the machines.   Review of Systems     Objective:   Physical Exam Alert and in no distress. Tympanic membranes and canals are normal. Pharyngeal area is normal. Neck is supple without adenopathy or thyromegaly. Cardiac exam shows a regular sinus rhythm without murmurs or gallops. Lungs are clear to auscultation.        Assessment & Plan:  Acute bronchitis, unspecified organism - Plan: azithromycin (ZITHROMAX) 500 MG tablet  ESRD on dialysis ([place])  Wheezing  Seasonal allergic rhinitis due to pollen I encouraged him to discuss the issues with the dialysis unit and if he is still uncomfortable, call and I can hopefully help resolve some of these issues.  Hopefully the Z-Pak will help with his symptoms although its not classic for bronchitis.  Today's blood pressure was slightly elevated and again asked him to discuss that with the dialysis unit.

## 2021-08-13 DIAGNOSIS — D509 Iron deficiency anemia, unspecified: Secondary | ICD-10-CM | POA: Diagnosis not present

## 2021-08-13 DIAGNOSIS — D631 Anemia in chronic kidney disease: Secondary | ICD-10-CM | POA: Diagnosis not present

## 2021-08-13 DIAGNOSIS — T7840XA Allergy, unspecified, initial encounter: Secondary | ICD-10-CM | POA: Diagnosis not present

## 2021-08-13 DIAGNOSIS — Z992 Dependence on renal dialysis: Secondary | ICD-10-CM | POA: Diagnosis not present

## 2021-08-13 DIAGNOSIS — N186 End stage renal disease: Secondary | ICD-10-CM | POA: Diagnosis not present

## 2021-08-13 DIAGNOSIS — D689 Coagulation defect, unspecified: Secondary | ICD-10-CM | POA: Diagnosis not present

## 2021-08-13 DIAGNOSIS — N2581 Secondary hyperparathyroidism of renal origin: Secondary | ICD-10-CM | POA: Diagnosis not present

## 2021-08-13 DIAGNOSIS — R52 Pain, unspecified: Secondary | ICD-10-CM | POA: Diagnosis not present

## 2021-08-18 ENCOUNTER — Telehealth: Payer: Self-pay | Admitting: Family Medicine

## 2021-08-18 DIAGNOSIS — Z992 Dependence on renal dialysis: Secondary | ICD-10-CM | POA: Diagnosis not present

## 2021-08-18 DIAGNOSIS — R52 Pain, unspecified: Secondary | ICD-10-CM | POA: Diagnosis not present

## 2021-08-18 DIAGNOSIS — D509 Iron deficiency anemia, unspecified: Secondary | ICD-10-CM | POA: Diagnosis not present

## 2021-08-18 DIAGNOSIS — T7840XA Allergy, unspecified, initial encounter: Secondary | ICD-10-CM | POA: Diagnosis not present

## 2021-08-18 DIAGNOSIS — N186 End stage renal disease: Secondary | ICD-10-CM | POA: Diagnosis not present

## 2021-08-18 DIAGNOSIS — D689 Coagulation defect, unspecified: Secondary | ICD-10-CM | POA: Diagnosis not present

## 2021-08-18 DIAGNOSIS — D631 Anemia in chronic kidney disease: Secondary | ICD-10-CM | POA: Diagnosis not present

## 2021-08-18 DIAGNOSIS — N2581 Secondary hyperparathyroidism of renal origin: Secondary | ICD-10-CM | POA: Diagnosis not present

## 2021-08-18 NOTE — Telephone Encounter (Signed)
Pts wife called and said pts cough had gotten better up until today while he was at dialysis. He is finished with antibiotic and today he cant talk because he's coughing so much and needs recommendations. He was given a Covid test today also. Please call wife if you have any questions.

## 2021-08-19 MED ORDER — BENZONATATE 100 MG PO CAPS: 200.0000 mg | ORAL_CAPSULE | Freq: Three times a day (TID) | ORAL | 0 refills | Status: DC | PRN

## 2021-08-19 NOTE — Telephone Encounter (Signed)
Pt wife was advised [facility]

## 2021-08-19 NOTE — Telephone Encounter (Signed)
Pts wife called back and said pt is still coughing bad today also and his Covid test was negative.

## 2021-08-20 DIAGNOSIS — N186 End stage renal disease: Secondary | ICD-10-CM | POA: Diagnosis not present

## 2021-08-20 DIAGNOSIS — D509 Iron deficiency anemia, unspecified: Secondary | ICD-10-CM | POA: Diagnosis not present

## 2021-08-20 DIAGNOSIS — R52 Pain, unspecified: Secondary | ICD-10-CM | POA: Diagnosis not present

## 2021-08-20 DIAGNOSIS — D631 Anemia in chronic kidney disease: Secondary | ICD-10-CM | POA: Diagnosis not present

## 2021-08-20 DIAGNOSIS — N2581 Secondary hyperparathyroidism of renal origin: Secondary | ICD-10-CM | POA: Diagnosis not present

## 2021-08-20 DIAGNOSIS — D689 Coagulation defect, unspecified: Secondary | ICD-10-CM | POA: Diagnosis not present

## 2021-08-20 DIAGNOSIS — Z992 Dependence on renal dialysis: Secondary | ICD-10-CM | POA: Diagnosis not present

## 2021-08-20 DIAGNOSIS — T7840XA Allergy, unspecified, initial encounter: Secondary | ICD-10-CM | POA: Diagnosis not present

## 2021-08-23 ENCOUNTER — Encounter: Payer: Self-pay | Admitting: Family Medicine

## 2021-08-23 ENCOUNTER — Ambulatory Visit (INDEPENDENT_AMBULATORY_CARE_PROVIDER_SITE_OTHER): Payer: Medicare Other | Admitting: Family Medicine

## 2021-08-23 ENCOUNTER — Other Ambulatory Visit: Payer: Self-pay

## 2021-08-23 VITALS — BP 170/98 | HR 61 | Temp 98.8°F | Wt 283.8 lb

## 2021-08-23 DIAGNOSIS — R058 Other specified cough: Secondary | ICD-10-CM

## 2021-08-23 DIAGNOSIS — R52 Pain, unspecified: Secondary | ICD-10-CM | POA: Diagnosis not present

## 2021-08-23 DIAGNOSIS — Z992 Dependence on renal dialysis: Secondary | ICD-10-CM

## 2021-08-23 DIAGNOSIS — D509 Iron deficiency anemia, unspecified: Secondary | ICD-10-CM | POA: Diagnosis not present

## 2021-08-23 DIAGNOSIS — N186 End stage renal disease: Secondary | ICD-10-CM | POA: Diagnosis not present

## 2021-08-23 DIAGNOSIS — R062 Wheezing: Secondary | ICD-10-CM | POA: Diagnosis not present

## 2021-08-23 DIAGNOSIS — D689 Coagulation defect, unspecified: Secondary | ICD-10-CM | POA: Diagnosis not present

## 2021-08-23 DIAGNOSIS — D631 Anemia in chronic kidney disease: Secondary | ICD-10-CM | POA: Diagnosis not present

## 2021-08-23 DIAGNOSIS — N2581 Secondary hyperparathyroidism of renal origin: Secondary | ICD-10-CM | POA: Diagnosis not present

## 2021-08-23 DIAGNOSIS — T7840XA Allergy, unspecified, initial encounter: Secondary | ICD-10-CM | POA: Diagnosis not present

## 2021-08-23 MED ORDER — AMOXICILLIN-POT CLAVULANATE 875-125 MG PO TABS: 1.0000 | ORAL_TABLET | Freq: Two times a day (BID) | ORAL | 0 refills | Status: DC

## 2021-08-23 NOTE — Progress Notes (Signed)
° °  Subjective:    Patient ID: Devon Cook, male    DOB: [birth year], 61 y.o.   MRN: [id number]  HPI He was seen on February 9 and treated with a Z-Pak for symptoms of coughing that of been going on for a month prior to that.  He states that when he finished the Z-Pak, shortly after that the cough became productive.  He also complains of sore throat earache and headache.  He continues in dialysis.  No chest pain or weakness.   Review of Systems     Objective:   Physical Exam Alert and in no distress. Tympanic membranes and canals are normal. Pharyngeal area is normal. Neck is supple without adenopathy or thyromegaly. Cardiac exam shows a regular sinus rhythm without murmurs or gallops. Lungs show expiratory wheezing.        Assessment & Plan:  Productive cough - Plan: DG Chest 2 View, Comprehensive metabolic panel, amoxicillin-clavulanate (AUGMENTIN) 875-125 MG tablet  ESRD on dialysis ([place])  Wheezing - Plan: DG Chest 2 View, Comprehensive metabolic panel, amoxicillin-clavulanate (AUGMENTIN) 875-125 MG tablet I will get blood work from dialysis to see what his hemoglobin and white blood count is.  We will also get a chest x-ray.  He plans to get that tomorrow.  He is to continue with the inhaler.

## 2021-08-24 ENCOUNTER — Ambulatory Visit
Admission: RE | Admit: 2021-08-24 | Discharge: 2021-08-24 | Disposition: A | Discharge status: Home / Self Care | Payer: Medicare Other | Source: Ambulatory Visit | Attending: Family Medicine | Admitting: Family Medicine

## 2021-08-24 ENCOUNTER — Other Ambulatory Visit: Payer: Self-pay | Admitting: Family Medicine

## 2021-08-24 DIAGNOSIS — R059 Cough, unspecified: Secondary | ICD-10-CM | POA: Diagnosis not present

## 2021-08-24 LAB — COMPREHENSIVE METABOLIC PANEL
ALT: 15 IU/L (ref 0–44)
AST: 21 IU/L (ref 0–40)
Albumin/Globulin Ratio: 1.2 (ref 1.2–2.2)
Albumin: 4.4 g/dL (ref 3.8–4.9)
Alkaline Phosphatase: 91 IU/L (ref 44–121)
BUN/Creatinine Ratio: 3 — ABNORMAL LOW (ref 10–24)
BUN: 26 mg/dL (ref 8–27)
Bilirubin Total: <0.2 mg/dL (ref 0.0–1.2)
CO2: 28 mmol/L (ref 20–29)
Calcium: 9.8 mg/dL (ref 8.6–10.2)
Chloride: 97 mmol/L (ref 96–106)
Creatinine, Ser: 7.47 mg/dL — ABNORMAL HIGH (ref 0.76–1.27)
Globulin, Total: 3.8 g/dL (ref 1.5–4.5)
Glucose: 96 mg/dL (ref 70–99)
Potassium: 4.4 mmol/L (ref 3.5–5.2)
Sodium: 138 mmol/L (ref 134–144)
Total Protein: 8.2 g/dL (ref 6.0–8.5)
eGFR: 8 mL/min/{1.73_m2} — ABNORMAL LOW (ref >59)

## 2021-08-24 MED ORDER — BENZONATATE 100 MG PO CAPS: 200.0000 mg | ORAL_CAPSULE | Freq: Three times a day (TID) | ORAL | 0 refills | Status: DC | PRN

## 2021-08-24 NOTE — Telephone Encounter (Signed)
[facility] is requesting to fill pt benzonate . Please advise Kh

## 2021-08-24 NOTE — Telephone Encounter (Signed)
[facility] is requesting to fill pt benzonatate. I have sent this to you already. [place]

## 2021-08-25 ENCOUNTER — Encounter: Payer: Self-pay | Admitting: Family Medicine

## 2021-08-25 ENCOUNTER — Other Ambulatory Visit: Payer: Medicare Other

## 2021-08-25 DIAGNOSIS — Z992 Dependence on renal dialysis: Secondary | ICD-10-CM | POA: Diagnosis not present

## 2021-08-25 DIAGNOSIS — D509 Iron deficiency anemia, unspecified: Secondary | ICD-10-CM | POA: Diagnosis not present

## 2021-08-25 DIAGNOSIS — N2581 Secondary hyperparathyroidism of renal origin: Secondary | ICD-10-CM | POA: Diagnosis not present

## 2021-08-25 DIAGNOSIS — N186 End stage renal disease: Secondary | ICD-10-CM | POA: Diagnosis not present

## 2021-08-25 DIAGNOSIS — D689 Coagulation defect, unspecified: Secondary | ICD-10-CM | POA: Diagnosis not present

## 2021-08-25 DIAGNOSIS — T7840XA Allergy, unspecified, initial encounter: Secondary | ICD-10-CM | POA: Diagnosis not present

## 2021-08-25 DIAGNOSIS — R52 Pain, unspecified: Secondary | ICD-10-CM | POA: Diagnosis not present

## 2021-08-25 DIAGNOSIS — D631 Anemia in chronic kidney disease: Secondary | ICD-10-CM | POA: Diagnosis not present

## 2021-08-27 DIAGNOSIS — D509 Iron deficiency anemia, unspecified: Secondary | ICD-10-CM | POA: Diagnosis not present

## 2021-08-27 DIAGNOSIS — N2581 Secondary hyperparathyroidism of renal origin: Secondary | ICD-10-CM | POA: Diagnosis not present

## 2021-08-27 DIAGNOSIS — T7840XA Allergy, unspecified, initial encounter: Secondary | ICD-10-CM | POA: Diagnosis not present

## 2021-08-27 DIAGNOSIS — D631 Anemia in chronic kidney disease: Secondary | ICD-10-CM | POA: Diagnosis not present

## 2021-08-27 DIAGNOSIS — N186 End stage renal disease: Secondary | ICD-10-CM | POA: Diagnosis not present

## 2021-08-27 DIAGNOSIS — R52 Pain, unspecified: Secondary | ICD-10-CM | POA: Diagnosis not present

## 2021-08-27 DIAGNOSIS — D689 Coagulation defect, unspecified: Secondary | ICD-10-CM | POA: Diagnosis not present

## 2021-08-27 DIAGNOSIS — Z992 Dependence on renal dialysis: Secondary | ICD-10-CM | POA: Diagnosis not present

## 2021-08-30 DIAGNOSIS — T7840XA Allergy, unspecified, initial encounter: Secondary | ICD-10-CM | POA: Diagnosis not present

## 2021-08-30 DIAGNOSIS — D631 Anemia in chronic kidney disease: Secondary | ICD-10-CM | POA: Diagnosis not present

## 2021-08-30 DIAGNOSIS — N2581 Secondary hyperparathyroidism of renal origin: Secondary | ICD-10-CM | POA: Diagnosis not present

## 2021-08-30 DIAGNOSIS — D689 Coagulation defect, unspecified: Secondary | ICD-10-CM | POA: Diagnosis not present

## 2021-08-30 DIAGNOSIS — Z992 Dependence on renal dialysis: Secondary | ICD-10-CM | POA: Diagnosis not present

## 2021-08-30 DIAGNOSIS — R52 Pain, unspecified: Secondary | ICD-10-CM | POA: Diagnosis not present

## 2021-08-30 DIAGNOSIS — N186 End stage renal disease: Secondary | ICD-10-CM | POA: Diagnosis not present

## 2021-08-30 DIAGNOSIS — D509 Iron deficiency anemia, unspecified: Secondary | ICD-10-CM | POA: Diagnosis not present

## 2021-08-31 DIAGNOSIS — Z992 Dependence on renal dialysis: Secondary | ICD-10-CM | POA: Diagnosis not present

## 2021-08-31 DIAGNOSIS — N138 Other obstructive and reflux uropathy: Secondary | ICD-10-CM | POA: Diagnosis not present

## 2021-08-31 DIAGNOSIS — N186 End stage renal disease: Secondary | ICD-10-CM | POA: Diagnosis not present

## 2021-09-01 DIAGNOSIS — D631 Anemia in chronic kidney disease: Secondary | ICD-10-CM | POA: Diagnosis not present

## 2021-09-01 DIAGNOSIS — D689 Coagulation defect, unspecified: Secondary | ICD-10-CM | POA: Diagnosis not present

## 2021-09-01 DIAGNOSIS — R52 Pain, unspecified: Secondary | ICD-10-CM | POA: Diagnosis not present

## 2021-09-01 DIAGNOSIS — N186 End stage renal disease: Secondary | ICD-10-CM | POA: Diagnosis not present

## 2021-09-01 DIAGNOSIS — Z992 Dependence on renal dialysis: Secondary | ICD-10-CM | POA: Diagnosis not present

## 2021-09-01 DIAGNOSIS — N2581 Secondary hyperparathyroidism of renal origin: Secondary | ICD-10-CM | POA: Diagnosis not present

## 2021-09-01 DIAGNOSIS — D509 Iron deficiency anemia, unspecified: Secondary | ICD-10-CM | POA: Diagnosis not present

## 2021-09-01 DIAGNOSIS — T7840XA Allergy, unspecified, initial encounter: Secondary | ICD-10-CM | POA: Diagnosis not present

## 2021-09-03 DIAGNOSIS — D689 Coagulation defect, unspecified: Secondary | ICD-10-CM | POA: Diagnosis not present

## 2021-09-03 DIAGNOSIS — N2581 Secondary hyperparathyroidism of renal origin: Secondary | ICD-10-CM | POA: Diagnosis not present

## 2021-09-03 DIAGNOSIS — T7840XA Allergy, unspecified, initial encounter: Secondary | ICD-10-CM | POA: Diagnosis not present

## 2021-09-03 DIAGNOSIS — D631 Anemia in chronic kidney disease: Secondary | ICD-10-CM | POA: Diagnosis not present

## 2021-09-03 DIAGNOSIS — N186 End stage renal disease: Secondary | ICD-10-CM | POA: Diagnosis not present

## 2021-09-03 DIAGNOSIS — D509 Iron deficiency anemia, unspecified: Secondary | ICD-10-CM | POA: Diagnosis not present

## 2021-09-03 DIAGNOSIS — Z992 Dependence on renal dialysis: Secondary | ICD-10-CM | POA: Diagnosis not present

## 2021-09-03 DIAGNOSIS — R52 Pain, unspecified: Secondary | ICD-10-CM | POA: Diagnosis not present

## 2021-09-06 ENCOUNTER — Other Ambulatory Visit: Payer: Self-pay | Admitting: Family Medicine

## 2021-09-06 DIAGNOSIS — F4329 Adjustment disorder with other symptoms: Secondary | ICD-10-CM

## 2021-09-06 DIAGNOSIS — R52 Pain, unspecified: Secondary | ICD-10-CM | POA: Diagnosis not present

## 2021-09-06 DIAGNOSIS — D689 Coagulation defect, unspecified: Secondary | ICD-10-CM | POA: Diagnosis not present

## 2021-09-06 DIAGNOSIS — T7840XA Allergy, unspecified, initial encounter: Secondary | ICD-10-CM | POA: Diagnosis not present

## 2021-09-06 DIAGNOSIS — N186 End stage renal disease: Secondary | ICD-10-CM | POA: Diagnosis not present

## 2021-09-06 DIAGNOSIS — Z992 Dependence on renal dialysis: Secondary | ICD-10-CM | POA: Diagnosis not present

## 2021-09-06 DIAGNOSIS — D631 Anemia in chronic kidney disease: Secondary | ICD-10-CM | POA: Diagnosis not present

## 2021-09-06 DIAGNOSIS — D509 Iron deficiency anemia, unspecified: Secondary | ICD-10-CM | POA: Diagnosis not present

## 2021-09-06 DIAGNOSIS — N2581 Secondary hyperparathyroidism of renal origin: Secondary | ICD-10-CM | POA: Diagnosis not present

## 2021-09-06 NOTE — Telephone Encounter (Signed)
[facility] Is requesting to fill pt xanax. Please advise [place] ?

## 2021-09-08 DIAGNOSIS — N2581 Secondary hyperparathyroidism of renal origin: Secondary | ICD-10-CM | POA: Diagnosis not present

## 2021-09-08 DIAGNOSIS — Z992 Dependence on renal dialysis: Secondary | ICD-10-CM | POA: Diagnosis not present

## 2021-09-08 DIAGNOSIS — D631 Anemia in chronic kidney disease: Secondary | ICD-10-CM | POA: Diagnosis not present

## 2021-09-08 DIAGNOSIS — T7840XA Allergy, unspecified, initial encounter: Secondary | ICD-10-CM | POA: Diagnosis not present

## 2021-09-08 DIAGNOSIS — R52 Pain, unspecified: Secondary | ICD-10-CM | POA: Diagnosis not present

## 2021-09-08 DIAGNOSIS — D689 Coagulation defect, unspecified: Secondary | ICD-10-CM | POA: Diagnosis not present

## 2021-09-08 DIAGNOSIS — N186 End stage renal disease: Secondary | ICD-10-CM | POA: Diagnosis not present

## 2021-09-08 DIAGNOSIS — D509 Iron deficiency anemia, unspecified: Secondary | ICD-10-CM | POA: Diagnosis not present

## 2021-09-09 ENCOUNTER — Encounter: Payer: Self-pay | Admitting: Family Medicine

## 2021-09-09 NOTE — Telephone Encounter (Signed)
Appt made. K[facility] 

## 2021-09-10 ENCOUNTER — Ambulatory Visit: Payer: Medicare Other | Admitting: Medical

## 2021-09-10 DIAGNOSIS — D509 Iron deficiency anemia, unspecified: Secondary | ICD-10-CM | POA: Diagnosis not present

## 2021-09-10 DIAGNOSIS — N186 End stage renal disease: Secondary | ICD-10-CM | POA: Diagnosis not present

## 2021-09-10 DIAGNOSIS — N2581 Secondary hyperparathyroidism of renal origin: Secondary | ICD-10-CM | POA: Diagnosis not present

## 2021-09-10 DIAGNOSIS — D631 Anemia in chronic kidney disease: Secondary | ICD-10-CM | POA: Diagnosis not present

## 2021-09-10 DIAGNOSIS — Z992 Dependence on renal dialysis: Secondary | ICD-10-CM | POA: Diagnosis not present

## 2021-09-10 DIAGNOSIS — D689 Coagulation defect, unspecified: Secondary | ICD-10-CM | POA: Diagnosis not present

## 2021-09-10 DIAGNOSIS — T7840XA Allergy, unspecified, initial encounter: Secondary | ICD-10-CM | POA: Diagnosis not present

## 2021-09-10 DIAGNOSIS — R52 Pain, unspecified: Secondary | ICD-10-CM | POA: Diagnosis not present

## 2021-09-13 ENCOUNTER — Encounter: Payer: Self-pay | Admitting: Family Medicine

## 2021-09-13 DIAGNOSIS — R52 Pain, unspecified: Secondary | ICD-10-CM | POA: Diagnosis not present

## 2021-09-13 DIAGNOSIS — D631 Anemia in chronic kidney disease: Secondary | ICD-10-CM | POA: Diagnosis not present

## 2021-09-13 DIAGNOSIS — N2581 Secondary hyperparathyroidism of renal origin: Secondary | ICD-10-CM | POA: Diagnosis not present

## 2021-09-13 DIAGNOSIS — N186 End stage renal disease: Secondary | ICD-10-CM | POA: Diagnosis not present

## 2021-09-13 DIAGNOSIS — D509 Iron deficiency anemia, unspecified: Secondary | ICD-10-CM | POA: Diagnosis not present

## 2021-09-13 DIAGNOSIS — T7840XA Allergy, unspecified, initial encounter: Secondary | ICD-10-CM | POA: Diagnosis not present

## 2021-09-13 DIAGNOSIS — D689 Coagulation defect, unspecified: Secondary | ICD-10-CM | POA: Diagnosis not present

## 2021-09-13 DIAGNOSIS — Z992 Dependence on renal dialysis: Secondary | ICD-10-CM | POA: Diagnosis not present

## 2021-09-15 DIAGNOSIS — Z992 Dependence on renal dialysis: Secondary | ICD-10-CM | POA: Diagnosis not present

## 2021-09-15 DIAGNOSIS — N2581 Secondary hyperparathyroidism of renal origin: Secondary | ICD-10-CM | POA: Diagnosis not present

## 2021-09-15 DIAGNOSIS — R52 Pain, unspecified: Secondary | ICD-10-CM | POA: Diagnosis not present

## 2021-09-15 DIAGNOSIS — D689 Coagulation defect, unspecified: Secondary | ICD-10-CM | POA: Diagnosis not present

## 2021-09-15 DIAGNOSIS — N186 End stage renal disease: Secondary | ICD-10-CM | POA: Diagnosis not present

## 2021-09-15 DIAGNOSIS — T7840XA Allergy, unspecified, initial encounter: Secondary | ICD-10-CM | POA: Diagnosis not present

## 2021-09-15 DIAGNOSIS — D631 Anemia in chronic kidney disease: Secondary | ICD-10-CM | POA: Diagnosis not present

## 2021-09-15 DIAGNOSIS — D509 Iron deficiency anemia, unspecified: Secondary | ICD-10-CM | POA: Diagnosis not present

## 2021-09-16 ENCOUNTER — Ambulatory Visit: Payer: Medicare Other | Admitting: Family Medicine

## 2021-09-17 DIAGNOSIS — D631 Anemia in chronic kidney disease: Secondary | ICD-10-CM | POA: Diagnosis not present

## 2021-09-17 DIAGNOSIS — D509 Iron deficiency anemia, unspecified: Secondary | ICD-10-CM | POA: Diagnosis not present

## 2021-09-17 DIAGNOSIS — Z992 Dependence on renal dialysis: Secondary | ICD-10-CM | POA: Diagnosis not present

## 2021-09-17 DIAGNOSIS — N2581 Secondary hyperparathyroidism of renal origin: Secondary | ICD-10-CM | POA: Diagnosis not present

## 2021-09-17 DIAGNOSIS — N186 End stage renal disease: Secondary | ICD-10-CM | POA: Diagnosis not present

## 2021-09-17 DIAGNOSIS — T7840XA Allergy, unspecified, initial encounter: Secondary | ICD-10-CM | POA: Diagnosis not present

## 2021-09-17 DIAGNOSIS — D689 Coagulation defect, unspecified: Secondary | ICD-10-CM | POA: Diagnosis not present

## 2021-09-17 DIAGNOSIS — R52 Pain, unspecified: Secondary | ICD-10-CM | POA: Diagnosis not present

## 2021-09-20 DIAGNOSIS — T7840XA Allergy, unspecified, initial encounter: Secondary | ICD-10-CM | POA: Diagnosis not present

## 2021-09-20 DIAGNOSIS — R52 Pain, unspecified: Secondary | ICD-10-CM | POA: Diagnosis not present

## 2021-09-20 DIAGNOSIS — D689 Coagulation defect, unspecified: Secondary | ICD-10-CM | POA: Diagnosis not present

## 2021-09-20 DIAGNOSIS — D509 Iron deficiency anemia, unspecified: Secondary | ICD-10-CM | POA: Diagnosis not present

## 2021-09-20 DIAGNOSIS — Z992 Dependence on renal dialysis: Secondary | ICD-10-CM | POA: Diagnosis not present

## 2021-09-20 DIAGNOSIS — D631 Anemia in chronic kidney disease: Secondary | ICD-10-CM | POA: Diagnosis not present

## 2021-09-20 DIAGNOSIS — N186 End stage renal disease: Secondary | ICD-10-CM | POA: Diagnosis not present

## 2021-09-20 DIAGNOSIS — N2581 Secondary hyperparathyroidism of renal origin: Secondary | ICD-10-CM | POA: Diagnosis not present

## 2021-09-22 DIAGNOSIS — D689 Coagulation defect, unspecified: Secondary | ICD-10-CM | POA: Diagnosis not present

## 2021-09-22 DIAGNOSIS — D509 Iron deficiency anemia, unspecified: Secondary | ICD-10-CM | POA: Diagnosis not present

## 2021-09-22 DIAGNOSIS — N2581 Secondary hyperparathyroidism of renal origin: Secondary | ICD-10-CM | POA: Diagnosis not present

## 2021-09-22 DIAGNOSIS — R52 Pain, unspecified: Secondary | ICD-10-CM | POA: Diagnosis not present

## 2021-09-22 DIAGNOSIS — T7840XA Allergy, unspecified, initial encounter: Secondary | ICD-10-CM | POA: Diagnosis not present

## 2021-09-22 DIAGNOSIS — Z992 Dependence on renal dialysis: Secondary | ICD-10-CM | POA: Diagnosis not present

## 2021-09-22 DIAGNOSIS — D631 Anemia in chronic kidney disease: Secondary | ICD-10-CM | POA: Diagnosis not present

## 2021-09-22 DIAGNOSIS — N186 End stage renal disease: Secondary | ICD-10-CM | POA: Diagnosis not present

## 2021-09-23 ENCOUNTER — Ambulatory Visit (INDEPENDENT_AMBULATORY_CARE_PROVIDER_SITE_OTHER): Payer: Medicare Other | Admitting: Family Medicine

## 2021-09-23 ENCOUNTER — Other Ambulatory Visit: Payer: Self-pay

## 2021-09-23 ENCOUNTER — Encounter: Payer: Self-pay | Admitting: Family Medicine

## 2021-09-23 VITALS — BP 110/70 | HR 52 | Temp 97.0°F | Wt 290.6 lb

## 2021-09-23 DIAGNOSIS — J301 Allergic rhinitis due to pollen: Secondary | ICD-10-CM | POA: Diagnosis not present

## 2021-09-23 NOTE — Patient Instructions (Signed)
Use Flonase or Rhinocort nasal spray and also either Claritin or Allegra.  If that does not work let me know ?

## 2021-09-23 NOTE — Progress Notes (Signed)
? ?  Subjective:  ? ? Patient ID: Devon Cook, male    DOB: 1961/03/15, 61 y.o.   MRN: [id number] ? ?HPI ?He complains of sneezing, itchy watery eyes, runny nose, ear congestion. ?He then went on to discuss the lack of supervision at the dialysis facility.  He did take a video of the fact that there was no supervising staff.  He apparently has complained to his physician as well as the Glass blower/designer.  So far he has not heard anything. ? ? ?Review of Systems ? ?   ?Objective:  ? Physical Exam ?Alert and in no distress.  TMs are normal.  Throat is clear.  Neck is supple without adenopathy.  Nasal mucosa normal. ? ? ? ?   ?Assessment & Plan:  ?Seasonal allergic rhinitis due to pollen ?Use Flonase or Rhinocort nasal spray and also either Claritin or Allegra.  If that does not work let me know ?I then discussed the situation at the dialysis unit.  Encouraged him to continue to monitor this.  Also advised him to not adjust any machines on his own.  Mention the possibility of having him send a letter to the [place] gets no response or even consider seeing if his doctor will send him to a different dialysis unit ? ?

## 2021-09-24 DIAGNOSIS — T7840XA Allergy, unspecified, initial encounter: Secondary | ICD-10-CM | POA: Diagnosis not present

## 2021-09-24 DIAGNOSIS — N186 End stage renal disease: Secondary | ICD-10-CM | POA: Diagnosis not present

## 2021-09-24 DIAGNOSIS — Z992 Dependence on renal dialysis: Secondary | ICD-10-CM | POA: Diagnosis not present

## 2021-09-24 DIAGNOSIS — R52 Pain, unspecified: Secondary | ICD-10-CM | POA: Diagnosis not present

## 2021-09-24 DIAGNOSIS — N2581 Secondary hyperparathyroidism of renal origin: Secondary | ICD-10-CM | POA: Diagnosis not present

## 2021-09-24 DIAGNOSIS — D689 Coagulation defect, unspecified: Secondary | ICD-10-CM | POA: Diagnosis not present

## 2021-09-24 DIAGNOSIS — D631 Anemia in chronic kidney disease: Secondary | ICD-10-CM | POA: Diagnosis not present

## 2021-09-24 DIAGNOSIS — D509 Iron deficiency anemia, unspecified: Secondary | ICD-10-CM | POA: Diagnosis not present

## 2021-09-27 DIAGNOSIS — R52 Pain, unspecified: Secondary | ICD-10-CM | POA: Diagnosis not present

## 2021-09-27 DIAGNOSIS — T7840XA Allergy, unspecified, initial encounter: Secondary | ICD-10-CM | POA: Diagnosis not present

## 2021-09-27 DIAGNOSIS — N2581 Secondary hyperparathyroidism of renal origin: Secondary | ICD-10-CM | POA: Diagnosis not present

## 2021-09-27 DIAGNOSIS — Z992 Dependence on renal dialysis: Secondary | ICD-10-CM | POA: Diagnosis not present

## 2021-09-27 DIAGNOSIS — N186 End stage renal disease: Secondary | ICD-10-CM | POA: Diagnosis not present

## 2021-09-27 DIAGNOSIS — D509 Iron deficiency anemia, unspecified: Secondary | ICD-10-CM | POA: Diagnosis not present

## 2021-09-27 DIAGNOSIS — D631 Anemia in chronic kidney disease: Secondary | ICD-10-CM | POA: Diagnosis not present

## 2021-09-27 DIAGNOSIS — D689 Coagulation defect, unspecified: Secondary | ICD-10-CM | POA: Diagnosis not present

## 2021-09-29 DIAGNOSIS — R52 Pain, unspecified: Secondary | ICD-10-CM | POA: Diagnosis not present

## 2021-09-29 DIAGNOSIS — N186 End stage renal disease: Secondary | ICD-10-CM | POA: Diagnosis not present

## 2021-09-29 DIAGNOSIS — Z992 Dependence on renal dialysis: Secondary | ICD-10-CM | POA: Diagnosis not present

## 2021-09-29 DIAGNOSIS — D509 Iron deficiency anemia, unspecified: Secondary | ICD-10-CM | POA: Diagnosis not present

## 2021-09-29 DIAGNOSIS — D689 Coagulation defect, unspecified: Secondary | ICD-10-CM | POA: Diagnosis not present

## 2021-09-29 DIAGNOSIS — T7840XA Allergy, unspecified, initial encounter: Secondary | ICD-10-CM | POA: Diagnosis not present

## 2021-09-29 DIAGNOSIS — N2581 Secondary hyperparathyroidism of renal origin: Secondary | ICD-10-CM | POA: Diagnosis not present

## 2021-09-29 DIAGNOSIS — D631 Anemia in chronic kidney disease: Secondary | ICD-10-CM | POA: Diagnosis not present

## 2021-10-01 DIAGNOSIS — D689 Coagulation defect, unspecified: Secondary | ICD-10-CM | POA: Diagnosis not present

## 2021-10-01 DIAGNOSIS — R52 Pain, unspecified: Secondary | ICD-10-CM | POA: Diagnosis not present

## 2021-10-01 DIAGNOSIS — T7840XA Allergy, unspecified, initial encounter: Secondary | ICD-10-CM | POA: Diagnosis not present

## 2021-10-01 DIAGNOSIS — Z992 Dependence on renal dialysis: Secondary | ICD-10-CM | POA: Diagnosis not present

## 2021-10-01 DIAGNOSIS — N186 End stage renal disease: Secondary | ICD-10-CM | POA: Diagnosis not present

## 2021-10-01 DIAGNOSIS — D631 Anemia in chronic kidney disease: Secondary | ICD-10-CM | POA: Diagnosis not present

## 2021-10-01 DIAGNOSIS — N138 Other obstructive and reflux uropathy: Secondary | ICD-10-CM | POA: Diagnosis not present

## 2021-10-01 DIAGNOSIS — D509 Iron deficiency anemia, unspecified: Secondary | ICD-10-CM | POA: Diagnosis not present

## 2021-10-01 DIAGNOSIS — N2581 Secondary hyperparathyroidism of renal origin: Secondary | ICD-10-CM | POA: Diagnosis not present

## 2021-10-03 DIAGNOSIS — Z992 Dependence on renal dialysis: Secondary | ICD-10-CM | POA: Diagnosis not present

## 2021-10-03 DIAGNOSIS — D689 Coagulation defect, unspecified: Secondary | ICD-10-CM | POA: Diagnosis not present

## 2021-10-03 DIAGNOSIS — R52 Pain, unspecified: Secondary | ICD-10-CM | POA: Diagnosis not present

## 2021-10-03 DIAGNOSIS — N186 End stage renal disease: Secondary | ICD-10-CM | POA: Diagnosis not present

## 2021-10-03 DIAGNOSIS — D631 Anemia in chronic kidney disease: Secondary | ICD-10-CM | POA: Diagnosis not present

## 2021-10-03 DIAGNOSIS — D509 Iron deficiency anemia, unspecified: Secondary | ICD-10-CM | POA: Diagnosis not present

## 2021-10-03 DIAGNOSIS — T7840XA Allergy, unspecified, initial encounter: Secondary | ICD-10-CM | POA: Diagnosis not present

## 2021-10-03 DIAGNOSIS — N2581 Secondary hyperparathyroidism of renal origin: Secondary | ICD-10-CM | POA: Diagnosis not present

## 2021-10-04 DIAGNOSIS — D631 Anemia in chronic kidney disease: Secondary | ICD-10-CM | POA: Diagnosis not present

## 2021-10-04 DIAGNOSIS — Z992 Dependence on renal dialysis: Secondary | ICD-10-CM | POA: Diagnosis not present

## 2021-10-04 DIAGNOSIS — N2581 Secondary hyperparathyroidism of renal origin: Secondary | ICD-10-CM | POA: Diagnosis not present

## 2021-10-04 DIAGNOSIS — T7840XA Allergy, unspecified, initial encounter: Secondary | ICD-10-CM | POA: Diagnosis not present

## 2021-10-04 DIAGNOSIS — D509 Iron deficiency anemia, unspecified: Secondary | ICD-10-CM | POA: Diagnosis not present

## 2021-10-04 DIAGNOSIS — R52 Pain, unspecified: Secondary | ICD-10-CM | POA: Diagnosis not present

## 2021-10-04 DIAGNOSIS — N186 End stage renal disease: Secondary | ICD-10-CM | POA: Diagnosis not present

## 2021-10-04 DIAGNOSIS — D689 Coagulation defect, unspecified: Secondary | ICD-10-CM | POA: Diagnosis not present

## 2021-10-06 DIAGNOSIS — R52 Pain, unspecified: Secondary | ICD-10-CM | POA: Diagnosis not present

## 2021-10-06 DIAGNOSIS — Z992 Dependence on renal dialysis: Secondary | ICD-10-CM | POA: Diagnosis not present

## 2021-10-06 DIAGNOSIS — T7840XA Allergy, unspecified, initial encounter: Secondary | ICD-10-CM | POA: Diagnosis not present

## 2021-10-06 DIAGNOSIS — D509 Iron deficiency anemia, unspecified: Secondary | ICD-10-CM | POA: Diagnosis not present

## 2021-10-06 DIAGNOSIS — D631 Anemia in chronic kidney disease: Secondary | ICD-10-CM | POA: Diagnosis not present

## 2021-10-06 DIAGNOSIS — D689 Coagulation defect, unspecified: Secondary | ICD-10-CM | POA: Diagnosis not present

## 2021-10-06 DIAGNOSIS — N186 End stage renal disease: Secondary | ICD-10-CM | POA: Diagnosis not present

## 2021-10-06 DIAGNOSIS — N2581 Secondary hyperparathyroidism of renal origin: Secondary | ICD-10-CM | POA: Diagnosis not present

## 2021-10-08 DIAGNOSIS — Z992 Dependence on renal dialysis: Secondary | ICD-10-CM | POA: Diagnosis not present

## 2021-10-08 DIAGNOSIS — D689 Coagulation defect, unspecified: Secondary | ICD-10-CM | POA: Diagnosis not present

## 2021-10-08 DIAGNOSIS — R52 Pain, unspecified: Secondary | ICD-10-CM | POA: Diagnosis not present

## 2021-10-08 DIAGNOSIS — N186 End stage renal disease: Secondary | ICD-10-CM | POA: Diagnosis not present

## 2021-10-11 DIAGNOSIS — T7840XA Allergy, unspecified, initial encounter: Secondary | ICD-10-CM | POA: Diagnosis not present

## 2021-10-11 DIAGNOSIS — D631 Anemia in chronic kidney disease: Secondary | ICD-10-CM | POA: Diagnosis not present

## 2021-10-11 DIAGNOSIS — N2581 Secondary hyperparathyroidism of renal origin: Secondary | ICD-10-CM | POA: Diagnosis not present

## 2021-10-11 DIAGNOSIS — D509 Iron deficiency anemia, unspecified: Secondary | ICD-10-CM | POA: Diagnosis not present

## 2021-10-11 DIAGNOSIS — N186 End stage renal disease: Secondary | ICD-10-CM | POA: Diagnosis not present

## 2021-10-11 DIAGNOSIS — D689 Coagulation defect, unspecified: Secondary | ICD-10-CM | POA: Diagnosis not present

## 2021-10-11 DIAGNOSIS — Z992 Dependence on renal dialysis: Secondary | ICD-10-CM | POA: Diagnosis not present

## 2021-10-11 DIAGNOSIS — R52 Pain, unspecified: Secondary | ICD-10-CM | POA: Diagnosis not present

## 2021-10-13 DIAGNOSIS — N2581 Secondary hyperparathyroidism of renal origin: Secondary | ICD-10-CM | POA: Diagnosis not present

## 2021-10-13 DIAGNOSIS — D631 Anemia in chronic kidney disease: Secondary | ICD-10-CM | POA: Diagnosis not present

## 2021-10-13 DIAGNOSIS — Z992 Dependence on renal dialysis: Secondary | ICD-10-CM | POA: Diagnosis not present

## 2021-10-13 DIAGNOSIS — D509 Iron deficiency anemia, unspecified: Secondary | ICD-10-CM | POA: Diagnosis not present

## 2021-10-13 DIAGNOSIS — N186 End stage renal disease: Secondary | ICD-10-CM | POA: Diagnosis not present

## 2021-10-13 DIAGNOSIS — R52 Pain, unspecified: Secondary | ICD-10-CM | POA: Diagnosis not present

## 2021-10-13 DIAGNOSIS — D689 Coagulation defect, unspecified: Secondary | ICD-10-CM | POA: Diagnosis not present

## 2021-10-13 DIAGNOSIS — T7840XA Allergy, unspecified, initial encounter: Secondary | ICD-10-CM | POA: Diagnosis not present

## 2021-10-15 DIAGNOSIS — R52 Pain, unspecified: Secondary | ICD-10-CM | POA: Diagnosis not present

## 2021-10-15 DIAGNOSIS — Z992 Dependence on renal dialysis: Secondary | ICD-10-CM | POA: Diagnosis not present

## 2021-10-15 DIAGNOSIS — D509 Iron deficiency anemia, unspecified: Secondary | ICD-10-CM | POA: Diagnosis not present

## 2021-10-15 DIAGNOSIS — N186 End stage renal disease: Secondary | ICD-10-CM | POA: Diagnosis not present

## 2021-10-15 DIAGNOSIS — D689 Coagulation defect, unspecified: Secondary | ICD-10-CM | POA: Diagnosis not present

## 2021-10-15 DIAGNOSIS — D631 Anemia in chronic kidney disease: Secondary | ICD-10-CM | POA: Diagnosis not present

## 2021-10-15 DIAGNOSIS — N2581 Secondary hyperparathyroidism of renal origin: Secondary | ICD-10-CM | POA: Diagnosis not present

## 2021-10-15 DIAGNOSIS — T7840XA Allergy, unspecified, initial encounter: Secondary | ICD-10-CM | POA: Diagnosis not present

## 2021-10-18 DIAGNOSIS — D509 Iron deficiency anemia, unspecified: Secondary | ICD-10-CM | POA: Diagnosis not present

## 2021-10-18 DIAGNOSIS — D631 Anemia in chronic kidney disease: Secondary | ICD-10-CM | POA: Diagnosis not present

## 2021-10-18 DIAGNOSIS — T7840XA Allergy, unspecified, initial encounter: Secondary | ICD-10-CM | POA: Diagnosis not present

## 2021-10-18 DIAGNOSIS — N2581 Secondary hyperparathyroidism of renal origin: Secondary | ICD-10-CM | POA: Diagnosis not present

## 2021-10-18 DIAGNOSIS — R52 Pain, unspecified: Secondary | ICD-10-CM | POA: Diagnosis not present

## 2021-10-18 DIAGNOSIS — D689 Coagulation defect, unspecified: Secondary | ICD-10-CM | POA: Diagnosis not present

## 2021-10-18 DIAGNOSIS — N186 End stage renal disease: Secondary | ICD-10-CM | POA: Diagnosis not present

## 2021-10-18 DIAGNOSIS — Z992 Dependence on renal dialysis: Secondary | ICD-10-CM | POA: Diagnosis not present

## 2021-10-20 DIAGNOSIS — D509 Iron deficiency anemia, unspecified: Secondary | ICD-10-CM | POA: Diagnosis not present

## 2021-10-20 DIAGNOSIS — N186 End stage renal disease: Secondary | ICD-10-CM | POA: Diagnosis not present

## 2021-10-20 DIAGNOSIS — Z992 Dependence on renal dialysis: Secondary | ICD-10-CM | POA: Diagnosis not present

## 2021-10-20 DIAGNOSIS — D631 Anemia in chronic kidney disease: Secondary | ICD-10-CM | POA: Diagnosis not present

## 2021-10-20 DIAGNOSIS — T7840XA Allergy, unspecified, initial encounter: Secondary | ICD-10-CM | POA: Diagnosis not present

## 2021-10-20 DIAGNOSIS — D689 Coagulation defect, unspecified: Secondary | ICD-10-CM | POA: Diagnosis not present

## 2021-10-20 DIAGNOSIS — R52 Pain, unspecified: Secondary | ICD-10-CM | POA: Diagnosis not present

## 2021-10-20 DIAGNOSIS — N2581 Secondary hyperparathyroidism of renal origin: Secondary | ICD-10-CM | POA: Diagnosis not present

## 2021-10-22 DIAGNOSIS — N2581 Secondary hyperparathyroidism of renal origin: Secondary | ICD-10-CM | POA: Diagnosis not present

## 2021-10-22 DIAGNOSIS — Z992 Dependence on renal dialysis: Secondary | ICD-10-CM | POA: Diagnosis not present

## 2021-10-22 DIAGNOSIS — N186 End stage renal disease: Secondary | ICD-10-CM | POA: Diagnosis not present

## 2021-10-22 DIAGNOSIS — T7840XA Allergy, unspecified, initial encounter: Secondary | ICD-10-CM | POA: Diagnosis not present

## 2021-10-22 DIAGNOSIS — D631 Anemia in chronic kidney disease: Secondary | ICD-10-CM | POA: Diagnosis not present

## 2021-10-22 DIAGNOSIS — D689 Coagulation defect, unspecified: Secondary | ICD-10-CM | POA: Diagnosis not present

## 2021-10-22 DIAGNOSIS — R52 Pain, unspecified: Secondary | ICD-10-CM | POA: Diagnosis not present

## 2021-10-22 DIAGNOSIS — D509 Iron deficiency anemia, unspecified: Secondary | ICD-10-CM | POA: Diagnosis not present

## 2021-10-25 ENCOUNTER — Encounter: Payer: Self-pay | Admitting: Family Medicine

## 2021-10-25 DIAGNOSIS — D509 Iron deficiency anemia, unspecified: Secondary | ICD-10-CM | POA: Diagnosis not present

## 2021-10-25 DIAGNOSIS — N2581 Secondary hyperparathyroidism of renal origin: Secondary | ICD-10-CM | POA: Diagnosis not present

## 2021-10-25 DIAGNOSIS — D631 Anemia in chronic kidney disease: Secondary | ICD-10-CM | POA: Diagnosis not present

## 2021-10-25 DIAGNOSIS — N186 End stage renal disease: Secondary | ICD-10-CM | POA: Diagnosis not present

## 2021-10-25 DIAGNOSIS — T7840XA Allergy, unspecified, initial encounter: Secondary | ICD-10-CM | POA: Diagnosis not present

## 2021-10-25 DIAGNOSIS — R52 Pain, unspecified: Secondary | ICD-10-CM | POA: Diagnosis not present

## 2021-10-25 DIAGNOSIS — D689 Coagulation defect, unspecified: Secondary | ICD-10-CM | POA: Diagnosis not present

## 2021-10-25 DIAGNOSIS — Z992 Dependence on renal dialysis: Secondary | ICD-10-CM | POA: Diagnosis not present

## 2021-10-26 ENCOUNTER — Telehealth: Payer: Self-pay

## 2021-10-26 NOTE — Telephone Encounter (Signed)
Pt. Called wanting to know if Dr. Redmond School could give him a call in regards to his hemoglobin.  ?

## 2021-10-27 DIAGNOSIS — T7840XA Allergy, unspecified, initial encounter: Secondary | ICD-10-CM | POA: Diagnosis not present

## 2021-10-27 DIAGNOSIS — D689 Coagulation defect, unspecified: Secondary | ICD-10-CM | POA: Diagnosis not present

## 2021-10-27 DIAGNOSIS — D509 Iron deficiency anemia, unspecified: Secondary | ICD-10-CM | POA: Diagnosis not present

## 2021-10-27 DIAGNOSIS — Z992 Dependence on renal dialysis: Secondary | ICD-10-CM | POA: Diagnosis not present

## 2021-10-27 DIAGNOSIS — N2581 Secondary hyperparathyroidism of renal origin: Secondary | ICD-10-CM | POA: Diagnosis not present

## 2021-10-27 DIAGNOSIS — R52 Pain, unspecified: Secondary | ICD-10-CM | POA: Diagnosis not present

## 2021-10-27 DIAGNOSIS — D631 Anemia in chronic kidney disease: Secondary | ICD-10-CM | POA: Diagnosis not present

## 2021-10-27 DIAGNOSIS — N186 End stage renal disease: Secondary | ICD-10-CM | POA: Diagnosis not present

## 2021-10-29 DIAGNOSIS — D631 Anemia in chronic kidney disease: Secondary | ICD-10-CM | POA: Diagnosis not present

## 2021-10-29 DIAGNOSIS — R52 Pain, unspecified: Secondary | ICD-10-CM | POA: Diagnosis not present

## 2021-10-29 DIAGNOSIS — D509 Iron deficiency anemia, unspecified: Secondary | ICD-10-CM | POA: Diagnosis not present

## 2021-10-29 DIAGNOSIS — N186 End stage renal disease: Secondary | ICD-10-CM | POA: Diagnosis not present

## 2021-10-29 DIAGNOSIS — Z992 Dependence on renal dialysis: Secondary | ICD-10-CM | POA: Diagnosis not present

## 2021-10-29 DIAGNOSIS — N2581 Secondary hyperparathyroidism of renal origin: Secondary | ICD-10-CM | POA: Diagnosis not present

## 2021-10-29 DIAGNOSIS — D689 Coagulation defect, unspecified: Secondary | ICD-10-CM | POA: Diagnosis not present

## 2021-10-29 DIAGNOSIS — T7840XA Allergy, unspecified, initial encounter: Secondary | ICD-10-CM | POA: Diagnosis not present

## 2021-10-31 DIAGNOSIS — N138 Other obstructive and reflux uropathy: Secondary | ICD-10-CM | POA: Diagnosis not present

## 2021-10-31 DIAGNOSIS — Z992 Dependence on renal dialysis: Secondary | ICD-10-CM | POA: Diagnosis not present

## 2021-10-31 DIAGNOSIS — N186 End stage renal disease: Secondary | ICD-10-CM | POA: Diagnosis not present

## 2021-11-01 DIAGNOSIS — Z992 Dependence on renal dialysis: Secondary | ICD-10-CM | POA: Diagnosis not present

## 2021-11-01 DIAGNOSIS — D689 Coagulation defect, unspecified: Secondary | ICD-10-CM | POA: Diagnosis not present

## 2021-11-01 DIAGNOSIS — N186 End stage renal disease: Secondary | ICD-10-CM | POA: Diagnosis not present

## 2021-11-01 DIAGNOSIS — T7840XA Allergy, unspecified, initial encounter: Secondary | ICD-10-CM | POA: Diagnosis not present

## 2021-11-01 DIAGNOSIS — R52 Pain, unspecified: Secondary | ICD-10-CM | POA: Diagnosis not present

## 2021-11-01 DIAGNOSIS — N2581 Secondary hyperparathyroidism of renal origin: Secondary | ICD-10-CM | POA: Diagnosis not present

## 2021-11-01 DIAGNOSIS — D631 Anemia in chronic kidney disease: Secondary | ICD-10-CM | POA: Diagnosis not present

## 2021-11-01 DIAGNOSIS — D509 Iron deficiency anemia, unspecified: Secondary | ICD-10-CM | POA: Diagnosis not present

## 2021-11-03 DIAGNOSIS — Z992 Dependence on renal dialysis: Secondary | ICD-10-CM | POA: Diagnosis not present

## 2021-11-03 DIAGNOSIS — N2581 Secondary hyperparathyroidism of renal origin: Secondary | ICD-10-CM | POA: Diagnosis not present

## 2021-11-03 DIAGNOSIS — R52 Pain, unspecified: Secondary | ICD-10-CM | POA: Diagnosis not present

## 2021-11-03 DIAGNOSIS — D509 Iron deficiency anemia, unspecified: Secondary | ICD-10-CM | POA: Diagnosis not present

## 2021-11-03 DIAGNOSIS — T7840XA Allergy, unspecified, initial encounter: Secondary | ICD-10-CM | POA: Diagnosis not present

## 2021-11-03 DIAGNOSIS — D631 Anemia in chronic kidney disease: Secondary | ICD-10-CM | POA: Diagnosis not present

## 2021-11-03 DIAGNOSIS — N186 End stage renal disease: Secondary | ICD-10-CM | POA: Diagnosis not present

## 2021-11-03 DIAGNOSIS — D689 Coagulation defect, unspecified: Secondary | ICD-10-CM | POA: Diagnosis not present

## 2021-11-04 ENCOUNTER — Ambulatory Visit (INDEPENDENT_AMBULATORY_CARE_PROVIDER_SITE_OTHER): Payer: Medicare Other | Admitting: Medical

## 2021-11-04 ENCOUNTER — Encounter: Payer: Self-pay | Admitting: Medical

## 2021-11-04 ENCOUNTER — Encounter (HOSPITAL_COMMUNITY): Payer: Self-pay

## 2021-11-04 VITALS — BP 124/82 | HR 83 | Temp 98.5°F | Ht 71.0 in | Wt 290.0 lb

## 2021-11-04 DIAGNOSIS — D649 Anemia, unspecified: Secondary | ICD-10-CM

## 2021-11-04 DIAGNOSIS — R7301 Impaired fasting glucose: Secondary | ICD-10-CM | POA: Diagnosis not present

## 2021-11-04 DIAGNOSIS — G4733 Obstructive sleep apnea (adult) (pediatric): Secondary | ICD-10-CM

## 2021-11-04 DIAGNOSIS — N186 End stage renal disease: Secondary | ICD-10-CM

## 2021-11-04 DIAGNOSIS — Z992 Dependence on renal dialysis: Secondary | ICD-10-CM

## 2021-11-04 DIAGNOSIS — I10 Essential (primary) hypertension: Secondary | ICD-10-CM

## 2021-11-04 DIAGNOSIS — N130 Hydronephrosis with ureteropelvic junction obstruction: Secondary | ICD-10-CM | POA: Diagnosis not present

## 2021-11-04 DIAGNOSIS — C7951 Secondary malignant neoplasm of bone: Secondary | ICD-10-CM | POA: Diagnosis not present

## 2021-11-04 NOTE — Progress Notes (Addendum)
Subjective: ? Devon Cook is a 61 y.o. male who presents for ?Chief Complaint  ?Patient presents with  ? Hypertension  ?   ?Here for consult to discuss DOT physical and medical issues. ? ?He normally sees Dr. Redmond School here for primary care.  This is my first visit with him ? ?His medical history includes hypertension, prostate cancer, end-stage renal disease on dialysis, erectile dysfunction, impaired glucose, obesity, history of hepatitis C, history of GI bleed, history of anemia. ? ?He notes that he usually gets his DOT physical at a clinic in [place] [address].  He notes that they are aware of his dialysis and that he has been cleared the last 2 years without incident.  He had brought in a letter from his kidney doctor for his last DOT physical. ? 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  But he wants to keep his DOT and affect ? ?He has been on dialysis for 2 years ? ?He notes compliance with his medications in general ? ?He just saw his urologist today in follow-up for history of prostate cancer on therapy.  He states that his prostate cancer is in remission. ? ?Blood pressures are controlled at home. ? ?He states that he has no symptoms of concern today. ? ?No other aggravating or relieving factors.   ? ?No other c/o. ? ?The following portions of the patient's history were reviewed and updated as appropriate: allergies, current medications, past family history, past medical history, past social history, past surgical history and problem list. ? ?ROS ?Otherwise as in subjective above ? ?Objective: ?BP 124/82   Pulse 83   Temp 98.5 ?F (36.9 ?C)   Ht '5\' 11"'$  (1.803 m)   Wt 290 lb (131.5 kg)   BMI 40.45 kg/m?  ? ?General appearance: alert, no distress, well developed, well nourished ?Otherwise not examined ? ? ?Assessment: ?Encounter Diagnoses  ?Name Primary?  ? Primary hypertension Yes  ? OSA (obstructive sleep apnea)   ? ESRD on dialysis [facility])   ?  Anemia, unspecified type   ? Impaired fasting glucose   ? ? ? ?Plan: ?This is my first meeting with him.  I reviewed over his health history. ? ?His blood pressure is controlled.  He says he is compliant with his medications in general.  Continue current therapies managed by nephrology ? ?History of prostate cancer-follow-up with urology as usual, continue current therapies ? ? ?After reviewing records he had a sleep study June 19, 2019 that showed severe obstructive sleep apnea.  Looking back in his notes he saw pulmonology from that time through 2021 and he was having difficulty with the mask.  At some point it would appear that he probably was not tolerating the mask.  I do not see any pulmonology follow-up after 2021.  He states that he has discussed this with his primary care provider and that he no longer has problems with his sleep.  I do not see any more recent sleep evaluation in the chart ? ?After discussing his health history I advised that I could not clear him for DOT physical based on what may be uncontrolled sleep apnea since he has not had any follow-up study after the 2020 evaluation. ? ?He is going to go back to where he was getting his DOT physical done before since they have a history with him and have been working with him in the past ? ?I recommended follow-up with  pulmonology for updated evaluation on his prior sleep issues ? ?He will continue routine follow-up with his kidney doctor and dialysis center. ? ?Devon Cook was seen today for hypertension. ? ?Diagnoses and all orders for this visit: ? ?Primary hypertension ? ?OSA (obstructive sleep apnea) ? ?ESRD on dialysis [facility]) ? ?Anemia, unspecified type ? ?Impaired fasting glucose ? ? ? ?Follow up: With PCP, specialists ?

## 2021-11-04 NOTE — Progress Notes (Signed)
I would change to ov vs cpe ?

## 2021-11-05 DIAGNOSIS — N186 End stage renal disease: Secondary | ICD-10-CM | POA: Diagnosis not present

## 2021-11-05 DIAGNOSIS — D509 Iron deficiency anemia, unspecified: Secondary | ICD-10-CM | POA: Diagnosis not present

## 2021-11-05 DIAGNOSIS — Z992 Dependence on renal dialysis: Secondary | ICD-10-CM | POA: Diagnosis not present

## 2021-11-05 DIAGNOSIS — D689 Coagulation defect, unspecified: Secondary | ICD-10-CM | POA: Diagnosis not present

## 2021-11-05 DIAGNOSIS — N2581 Secondary hyperparathyroidism of renal origin: Secondary | ICD-10-CM | POA: Diagnosis not present

## 2021-11-05 DIAGNOSIS — R52 Pain, unspecified: Secondary | ICD-10-CM | POA: Diagnosis not present

## 2021-11-05 DIAGNOSIS — T7840XA Allergy, unspecified, initial encounter: Secondary | ICD-10-CM | POA: Diagnosis not present

## 2021-11-05 DIAGNOSIS — D631 Anemia in chronic kidney disease: Secondary | ICD-10-CM | POA: Diagnosis not present

## 2021-11-05 NOTE — Progress Notes (Signed)
done

## 2021-11-08 DIAGNOSIS — R52 Pain, unspecified: Secondary | ICD-10-CM | POA: Diagnosis not present

## 2021-11-08 DIAGNOSIS — N186 End stage renal disease: Secondary | ICD-10-CM | POA: Diagnosis not present

## 2021-11-08 DIAGNOSIS — N2581 Secondary hyperparathyroidism of renal origin: Secondary | ICD-10-CM | POA: Diagnosis not present

## 2021-11-08 DIAGNOSIS — T7840XA Allergy, unspecified, initial encounter: Secondary | ICD-10-CM | POA: Diagnosis not present

## 2021-11-08 DIAGNOSIS — D689 Coagulation defect, unspecified: Secondary | ICD-10-CM | POA: Diagnosis not present

## 2021-11-08 DIAGNOSIS — D509 Iron deficiency anemia, unspecified: Secondary | ICD-10-CM | POA: Diagnosis not present

## 2021-11-08 DIAGNOSIS — D631 Anemia in chronic kidney disease: Secondary | ICD-10-CM | POA: Diagnosis not present

## 2021-11-08 DIAGNOSIS — Z992 Dependence on renal dialysis: Secondary | ICD-10-CM | POA: Diagnosis not present

## 2021-11-10 DIAGNOSIS — D689 Coagulation defect, unspecified: Secondary | ICD-10-CM | POA: Diagnosis not present

## 2021-11-10 DIAGNOSIS — D631 Anemia in chronic kidney disease: Secondary | ICD-10-CM | POA: Diagnosis not present

## 2021-11-10 DIAGNOSIS — N2581 Secondary hyperparathyroidism of renal origin: Secondary | ICD-10-CM | POA: Diagnosis not present

## 2021-11-10 DIAGNOSIS — D509 Iron deficiency anemia, unspecified: Secondary | ICD-10-CM | POA: Diagnosis not present

## 2021-11-10 DIAGNOSIS — Z992 Dependence on renal dialysis: Secondary | ICD-10-CM | POA: Diagnosis not present

## 2021-11-10 DIAGNOSIS — T7840XA Allergy, unspecified, initial encounter: Secondary | ICD-10-CM | POA: Diagnosis not present

## 2021-11-10 DIAGNOSIS — R52 Pain, unspecified: Secondary | ICD-10-CM | POA: Diagnosis not present

## 2021-11-10 DIAGNOSIS — N186 End stage renal disease: Secondary | ICD-10-CM | POA: Diagnosis not present

## 2021-11-12 DIAGNOSIS — N186 End stage renal disease: Secondary | ICD-10-CM | POA: Diagnosis not present

## 2021-11-12 DIAGNOSIS — D509 Iron deficiency anemia, unspecified: Secondary | ICD-10-CM | POA: Diagnosis not present

## 2021-11-12 DIAGNOSIS — N2581 Secondary hyperparathyroidism of renal origin: Secondary | ICD-10-CM | POA: Diagnosis not present

## 2021-11-12 DIAGNOSIS — T7840XA Allergy, unspecified, initial encounter: Secondary | ICD-10-CM | POA: Diagnosis not present

## 2021-11-12 DIAGNOSIS — D689 Coagulation defect, unspecified: Secondary | ICD-10-CM | POA: Diagnosis not present

## 2021-11-12 DIAGNOSIS — Z992 Dependence on renal dialysis: Secondary | ICD-10-CM | POA: Diagnosis not present

## 2021-11-12 DIAGNOSIS — R52 Pain, unspecified: Secondary | ICD-10-CM | POA: Diagnosis not present

## 2021-11-12 DIAGNOSIS — D631 Anemia in chronic kidney disease: Secondary | ICD-10-CM | POA: Diagnosis not present

## 2021-11-15 ENCOUNTER — Other Ambulatory Visit (HOSPITAL_COMMUNITY): Payer: Self-pay | Admitting: *Deleted

## 2021-11-15 DIAGNOSIS — D631 Anemia in chronic kidney disease: Secondary | ICD-10-CM | POA: Diagnosis not present

## 2021-11-15 DIAGNOSIS — R52 Pain, unspecified: Secondary | ICD-10-CM | POA: Diagnosis not present

## 2021-11-15 DIAGNOSIS — N186 End stage renal disease: Secondary | ICD-10-CM | POA: Diagnosis not present

## 2021-11-15 DIAGNOSIS — T7840XA Allergy, unspecified, initial encounter: Secondary | ICD-10-CM | POA: Diagnosis not present

## 2021-11-15 DIAGNOSIS — Z992 Dependence on renal dialysis: Secondary | ICD-10-CM | POA: Diagnosis not present

## 2021-11-15 DIAGNOSIS — D689 Coagulation defect, unspecified: Secondary | ICD-10-CM | POA: Diagnosis not present

## 2021-11-15 DIAGNOSIS — N2581 Secondary hyperparathyroidism of renal origin: Secondary | ICD-10-CM | POA: Diagnosis not present

## 2021-11-15 DIAGNOSIS — D509 Iron deficiency anemia, unspecified: Secondary | ICD-10-CM | POA: Diagnosis not present

## 2021-11-16 ENCOUNTER — Ambulatory Visit (HOSPITAL_COMMUNITY)
Admission: RE | Admit: 2021-11-16 | Discharge: 2021-11-16 | Disposition: A | Discharge status: Home / Self Care | Payer: Medicare Other | Source: Ambulatory Visit | Attending: Nephrology | Admitting: Nephrology

## 2021-11-16 DIAGNOSIS — D631 Anemia in chronic kidney disease: Secondary | ICD-10-CM | POA: Diagnosis not present

## 2021-11-16 DIAGNOSIS — N189 Chronic kidney disease, unspecified: Secondary | ICD-10-CM | POA: Diagnosis not present

## 2021-11-16 LAB — PREPARE RBC (CROSSMATCH)

## 2021-11-16 MED ORDER — DIPHENHYDRAMINE HCL 25 MG PO CAPS
25.0000 mg | ORAL_CAPSULE | Freq: Once | ORAL | Status: AC
  Administered 2021-11-16: 25 mg via ORAL

## 2021-11-16 MED ORDER — ACETAMINOPHEN 325 MG PO TABS
650.0000 mg | ORAL_TABLET | Freq: Once | ORAL | Status: AC
  Administered 2021-11-16: 650 mg via ORAL

## 2021-11-16 MED ORDER — DIPHENHYDRAMINE HCL 25 MG PO CAPS
ORAL_CAPSULE | ORAL | Status: AC
  Filled 2021-11-16: qty 1

## 2021-11-16 MED ORDER — ACETAMINOPHEN 325 MG PO TABS
ORAL_TABLET | ORAL | Status: AC
  Filled 2021-11-16: qty 2

## 2021-11-16 MED ORDER — SODIUM CHLORIDE 0.9% IV SOLUTION: Freq: Once | INTRAVENOUS | Status: DC

## 2021-11-17 DIAGNOSIS — D509 Iron deficiency anemia, unspecified: Secondary | ICD-10-CM | POA: Diagnosis not present

## 2021-11-17 DIAGNOSIS — N186 End stage renal disease: Secondary | ICD-10-CM | POA: Diagnosis not present

## 2021-11-17 DIAGNOSIS — D689 Coagulation defect, unspecified: Secondary | ICD-10-CM | POA: Diagnosis not present

## 2021-11-17 DIAGNOSIS — R52 Pain, unspecified: Secondary | ICD-10-CM | POA: Diagnosis not present

## 2021-11-17 DIAGNOSIS — D631 Anemia in chronic kidney disease: Secondary | ICD-10-CM | POA: Diagnosis not present

## 2021-11-17 DIAGNOSIS — T7840XA Allergy, unspecified, initial encounter: Secondary | ICD-10-CM | POA: Diagnosis not present

## 2021-11-17 DIAGNOSIS — Z992 Dependence on renal dialysis: Secondary | ICD-10-CM | POA: Diagnosis not present

## 2021-11-17 DIAGNOSIS — N2581 Secondary hyperparathyroidism of renal origin: Secondary | ICD-10-CM | POA: Diagnosis not present

## 2021-11-17 LAB — TYPE AND SCREEN
ABO/RH(D): B POS
Antibody Screen: NEGATIVE
Unit division: 0

## 2021-11-17 LAB — BPAM RBC
Blood Product Expiration Date: 202306052359
ISSUE DATE / TIME: 202305160905
Unit Type and Rh: 7300

## 2021-11-19 DIAGNOSIS — D689 Coagulation defect, unspecified: Secondary | ICD-10-CM | POA: Diagnosis not present

## 2021-11-19 DIAGNOSIS — N2581 Secondary hyperparathyroidism of renal origin: Secondary | ICD-10-CM | POA: Diagnosis not present

## 2021-11-19 DIAGNOSIS — D631 Anemia in chronic kidney disease: Secondary | ICD-10-CM | POA: Diagnosis not present

## 2021-11-19 DIAGNOSIS — R52 Pain, unspecified: Secondary | ICD-10-CM | POA: Diagnosis not present

## 2021-11-19 DIAGNOSIS — T7840XA Allergy, unspecified, initial encounter: Secondary | ICD-10-CM | POA: Diagnosis not present

## 2021-11-19 DIAGNOSIS — D509 Iron deficiency anemia, unspecified: Secondary | ICD-10-CM | POA: Diagnosis not present

## 2021-11-19 DIAGNOSIS — N186 End stage renal disease: Secondary | ICD-10-CM | POA: Diagnosis not present

## 2021-11-19 DIAGNOSIS — Z992 Dependence on renal dialysis: Secondary | ICD-10-CM | POA: Diagnosis not present

## 2021-11-22 DIAGNOSIS — D689 Coagulation defect, unspecified: Secondary | ICD-10-CM | POA: Diagnosis not present

## 2021-11-22 DIAGNOSIS — N186 End stage renal disease: Secondary | ICD-10-CM | POA: Diagnosis not present

## 2021-11-22 DIAGNOSIS — Z992 Dependence on renal dialysis: Secondary | ICD-10-CM | POA: Diagnosis not present

## 2021-11-22 DIAGNOSIS — D631 Anemia in chronic kidney disease: Secondary | ICD-10-CM | POA: Diagnosis not present

## 2021-11-22 DIAGNOSIS — T7840XA Allergy, unspecified, initial encounter: Secondary | ICD-10-CM | POA: Diagnosis not present

## 2021-11-22 DIAGNOSIS — D509 Iron deficiency anemia, unspecified: Secondary | ICD-10-CM | POA: Diagnosis not present

## 2021-11-22 DIAGNOSIS — N2581 Secondary hyperparathyroidism of renal origin: Secondary | ICD-10-CM | POA: Diagnosis not present

## 2021-11-22 DIAGNOSIS — R52 Pain, unspecified: Secondary | ICD-10-CM | POA: Diagnosis not present

## 2021-11-24 DIAGNOSIS — N2581 Secondary hyperparathyroidism of renal origin: Secondary | ICD-10-CM | POA: Diagnosis not present

## 2021-11-24 DIAGNOSIS — D689 Coagulation defect, unspecified: Secondary | ICD-10-CM | POA: Diagnosis not present

## 2021-11-24 DIAGNOSIS — Z992 Dependence on renal dialysis: Secondary | ICD-10-CM | POA: Diagnosis not present

## 2021-11-24 DIAGNOSIS — R52 Pain, unspecified: Secondary | ICD-10-CM | POA: Diagnosis not present

## 2021-11-24 DIAGNOSIS — D509 Iron deficiency anemia, unspecified: Secondary | ICD-10-CM | POA: Diagnosis not present

## 2021-11-24 DIAGNOSIS — N186 End stage renal disease: Secondary | ICD-10-CM | POA: Diagnosis not present

## 2021-11-24 DIAGNOSIS — D631 Anemia in chronic kidney disease: Secondary | ICD-10-CM | POA: Diagnosis not present

## 2021-11-24 DIAGNOSIS — T7840XA Allergy, unspecified, initial encounter: Secondary | ICD-10-CM | POA: Diagnosis not present

## 2021-11-26 ENCOUNTER — Telehealth: Payer: Self-pay

## 2021-11-26 DIAGNOSIS — D631 Anemia in chronic kidney disease: Secondary | ICD-10-CM | POA: Diagnosis not present

## 2021-11-26 DIAGNOSIS — D689 Coagulation defect, unspecified: Secondary | ICD-10-CM | POA: Diagnosis not present

## 2021-11-26 DIAGNOSIS — N186 End stage renal disease: Secondary | ICD-10-CM | POA: Diagnosis not present

## 2021-11-26 DIAGNOSIS — T7840XA Allergy, unspecified, initial encounter: Secondary | ICD-10-CM | POA: Diagnosis not present

## 2021-11-26 DIAGNOSIS — N2581 Secondary hyperparathyroidism of renal origin: Secondary | ICD-10-CM | POA: Diagnosis not present

## 2021-11-26 DIAGNOSIS — Z992 Dependence on renal dialysis: Secondary | ICD-10-CM | POA: Diagnosis not present

## 2021-11-26 DIAGNOSIS — D509 Iron deficiency anemia, unspecified: Secondary | ICD-10-CM | POA: Diagnosis not present

## 2021-11-26 DIAGNOSIS — R52 Pain, unspecified: Secondary | ICD-10-CM | POA: Diagnosis not present

## 2021-11-26 NOTE — Telephone Encounter (Signed)
NOTES SCANNED TO REFERRAL 

## 2021-11-29 DIAGNOSIS — D509 Iron deficiency anemia, unspecified: Secondary | ICD-10-CM | POA: Diagnosis not present

## 2021-11-29 DIAGNOSIS — R52 Pain, unspecified: Secondary | ICD-10-CM | POA: Diagnosis not present

## 2021-11-29 DIAGNOSIS — D631 Anemia in chronic kidney disease: Secondary | ICD-10-CM | POA: Diagnosis not present

## 2021-11-29 DIAGNOSIS — N186 End stage renal disease: Secondary | ICD-10-CM | POA: Diagnosis not present

## 2021-11-29 DIAGNOSIS — N2581 Secondary hyperparathyroidism of renal origin: Secondary | ICD-10-CM | POA: Diagnosis not present

## 2021-11-29 DIAGNOSIS — T7840XA Allergy, unspecified, initial encounter: Secondary | ICD-10-CM | POA: Diagnosis not present

## 2021-11-29 DIAGNOSIS — Z992 Dependence on renal dialysis: Secondary | ICD-10-CM | POA: Diagnosis not present

## 2021-11-29 DIAGNOSIS — D689 Coagulation defect, unspecified: Secondary | ICD-10-CM | POA: Diagnosis not present

## 2021-12-01 DIAGNOSIS — D631 Anemia in chronic kidney disease: Secondary | ICD-10-CM | POA: Diagnosis not present

## 2021-12-01 DIAGNOSIS — N138 Other obstructive and reflux uropathy: Secondary | ICD-10-CM | POA: Diagnosis not present

## 2021-12-01 DIAGNOSIS — T7840XA Allergy, unspecified, initial encounter: Secondary | ICD-10-CM | POA: Diagnosis not present

## 2021-12-01 DIAGNOSIS — D509 Iron deficiency anemia, unspecified: Secondary | ICD-10-CM | POA: Diagnosis not present

## 2021-12-01 DIAGNOSIS — N2581 Secondary hyperparathyroidism of renal origin: Secondary | ICD-10-CM | POA: Diagnosis not present

## 2021-12-01 DIAGNOSIS — R52 Pain, unspecified: Secondary | ICD-10-CM | POA: Diagnosis not present

## 2021-12-01 DIAGNOSIS — D689 Coagulation defect, unspecified: Secondary | ICD-10-CM | POA: Diagnosis not present

## 2021-12-01 DIAGNOSIS — Z992 Dependence on renal dialysis: Secondary | ICD-10-CM | POA: Diagnosis not present

## 2021-12-01 DIAGNOSIS — N186 End stage renal disease: Secondary | ICD-10-CM | POA: Diagnosis not present

## 2021-12-03 DIAGNOSIS — T7840XA Allergy, unspecified, initial encounter: Secondary | ICD-10-CM | POA: Diagnosis not present

## 2021-12-03 DIAGNOSIS — D509 Iron deficiency anemia, unspecified: Secondary | ICD-10-CM | POA: Diagnosis not present

## 2021-12-03 DIAGNOSIS — R52 Pain, unspecified: Secondary | ICD-10-CM | POA: Diagnosis not present

## 2021-12-03 DIAGNOSIS — D689 Coagulation defect, unspecified: Secondary | ICD-10-CM | POA: Diagnosis not present

## 2021-12-03 DIAGNOSIS — Z992 Dependence on renal dialysis: Secondary | ICD-10-CM | POA: Diagnosis not present

## 2021-12-03 DIAGNOSIS — N2581 Secondary hyperparathyroidism of renal origin: Secondary | ICD-10-CM | POA: Diagnosis not present

## 2021-12-03 DIAGNOSIS — N186 End stage renal disease: Secondary | ICD-10-CM | POA: Diagnosis not present

## 2021-12-03 DIAGNOSIS — D631 Anemia in chronic kidney disease: Secondary | ICD-10-CM | POA: Diagnosis not present

## 2021-12-06 DIAGNOSIS — D509 Iron deficiency anemia, unspecified: Secondary | ICD-10-CM | POA: Diagnosis not present

## 2021-12-06 DIAGNOSIS — T7840XA Allergy, unspecified, initial encounter: Secondary | ICD-10-CM | POA: Diagnosis not present

## 2021-12-06 DIAGNOSIS — R52 Pain, unspecified: Secondary | ICD-10-CM | POA: Diagnosis not present

## 2021-12-06 DIAGNOSIS — Z992 Dependence on renal dialysis: Secondary | ICD-10-CM | POA: Diagnosis not present

## 2021-12-06 DIAGNOSIS — D689 Coagulation defect, unspecified: Secondary | ICD-10-CM | POA: Diagnosis not present

## 2021-12-06 DIAGNOSIS — N2581 Secondary hyperparathyroidism of renal origin: Secondary | ICD-10-CM | POA: Diagnosis not present

## 2021-12-06 DIAGNOSIS — N186 End stage renal disease: Secondary | ICD-10-CM | POA: Diagnosis not present

## 2021-12-06 DIAGNOSIS — D631 Anemia in chronic kidney disease: Secondary | ICD-10-CM | POA: Diagnosis not present

## 2021-12-08 DIAGNOSIS — D509 Iron deficiency anemia, unspecified: Secondary | ICD-10-CM | POA: Diagnosis not present

## 2021-12-08 DIAGNOSIS — R52 Pain, unspecified: Secondary | ICD-10-CM | POA: Diagnosis not present

## 2021-12-08 DIAGNOSIS — Z992 Dependence on renal dialysis: Secondary | ICD-10-CM | POA: Diagnosis not present

## 2021-12-08 DIAGNOSIS — D689 Coagulation defect, unspecified: Secondary | ICD-10-CM | POA: Diagnosis not present

## 2021-12-08 DIAGNOSIS — D631 Anemia in chronic kidney disease: Secondary | ICD-10-CM | POA: Diagnosis not present

## 2021-12-08 DIAGNOSIS — N186 End stage renal disease: Secondary | ICD-10-CM | POA: Diagnosis not present

## 2021-12-08 DIAGNOSIS — N2581 Secondary hyperparathyroidism of renal origin: Secondary | ICD-10-CM | POA: Diagnosis not present

## 2021-12-08 DIAGNOSIS — T7840XA Allergy, unspecified, initial encounter: Secondary | ICD-10-CM | POA: Diagnosis not present

## 2021-12-10 DIAGNOSIS — N186 End stage renal disease: Secondary | ICD-10-CM | POA: Diagnosis not present

## 2021-12-10 DIAGNOSIS — D509 Iron deficiency anemia, unspecified: Secondary | ICD-10-CM | POA: Diagnosis not present

## 2021-12-10 DIAGNOSIS — Z992 Dependence on renal dialysis: Secondary | ICD-10-CM | POA: Diagnosis not present

## 2021-12-10 DIAGNOSIS — N2581 Secondary hyperparathyroidism of renal origin: Secondary | ICD-10-CM | POA: Diagnosis not present

## 2021-12-10 DIAGNOSIS — D689 Coagulation defect, unspecified: Secondary | ICD-10-CM | POA: Diagnosis not present

## 2021-12-10 DIAGNOSIS — R52 Pain, unspecified: Secondary | ICD-10-CM | POA: Diagnosis not present

## 2021-12-10 DIAGNOSIS — T7840XA Allergy, unspecified, initial encounter: Secondary | ICD-10-CM | POA: Diagnosis not present

## 2021-12-10 DIAGNOSIS — D631 Anemia in chronic kidney disease: Secondary | ICD-10-CM | POA: Diagnosis not present

## 2021-12-13 DIAGNOSIS — Z992 Dependence on renal dialysis: Secondary | ICD-10-CM | POA: Diagnosis not present

## 2021-12-13 DIAGNOSIS — D689 Coagulation defect, unspecified: Secondary | ICD-10-CM | POA: Diagnosis not present

## 2021-12-13 DIAGNOSIS — R52 Pain, unspecified: Secondary | ICD-10-CM | POA: Diagnosis not present

## 2021-12-13 DIAGNOSIS — N186 End stage renal disease: Secondary | ICD-10-CM | POA: Diagnosis not present

## 2021-12-13 DIAGNOSIS — D631 Anemia in chronic kidney disease: Secondary | ICD-10-CM | POA: Diagnosis not present

## 2021-12-13 DIAGNOSIS — T7840XA Allergy, unspecified, initial encounter: Secondary | ICD-10-CM | POA: Diagnosis not present

## 2021-12-13 DIAGNOSIS — D509 Iron deficiency anemia, unspecified: Secondary | ICD-10-CM | POA: Diagnosis not present

## 2021-12-13 DIAGNOSIS — N2581 Secondary hyperparathyroidism of renal origin: Secondary | ICD-10-CM | POA: Diagnosis not present

## 2021-12-15 DIAGNOSIS — N186 End stage renal disease: Secondary | ICD-10-CM | POA: Diagnosis not present

## 2021-12-15 DIAGNOSIS — R52 Pain, unspecified: Secondary | ICD-10-CM | POA: Diagnosis not present

## 2021-12-15 DIAGNOSIS — D631 Anemia in chronic kidney disease: Secondary | ICD-10-CM | POA: Diagnosis not present

## 2021-12-15 DIAGNOSIS — Z992 Dependence on renal dialysis: Secondary | ICD-10-CM | POA: Diagnosis not present

## 2021-12-15 DIAGNOSIS — D689 Coagulation defect, unspecified: Secondary | ICD-10-CM | POA: Diagnosis not present

## 2021-12-15 DIAGNOSIS — T7840XA Allergy, unspecified, initial encounter: Secondary | ICD-10-CM | POA: Diagnosis not present

## 2021-12-15 DIAGNOSIS — N2581 Secondary hyperparathyroidism of renal origin: Secondary | ICD-10-CM | POA: Diagnosis not present

## 2021-12-15 DIAGNOSIS — D509 Iron deficiency anemia, unspecified: Secondary | ICD-10-CM | POA: Diagnosis not present

## 2021-12-17 ENCOUNTER — Telehealth: Payer: Self-pay | Admitting: Cardiology

## 2021-12-17 DIAGNOSIS — Z992 Dependence on renal dialysis: Secondary | ICD-10-CM | POA: Diagnosis not present

## 2021-12-17 DIAGNOSIS — N2581 Secondary hyperparathyroidism of renal origin: Secondary | ICD-10-CM | POA: Diagnosis not present

## 2021-12-17 DIAGNOSIS — D689 Coagulation defect, unspecified: Secondary | ICD-10-CM | POA: Diagnosis not present

## 2021-12-17 DIAGNOSIS — R52 Pain, unspecified: Secondary | ICD-10-CM | POA: Diagnosis not present

## 2021-12-17 DIAGNOSIS — N186 End stage renal disease: Secondary | ICD-10-CM | POA: Diagnosis not present

## 2021-12-17 DIAGNOSIS — T7840XA Allergy, unspecified, initial encounter: Secondary | ICD-10-CM | POA: Diagnosis not present

## 2021-12-17 DIAGNOSIS — D631 Anemia in chronic kidney disease: Secondary | ICD-10-CM | POA: Diagnosis not present

## 2021-12-17 DIAGNOSIS — D509 Iron deficiency anemia, unspecified: Secondary | ICD-10-CM | POA: Diagnosis not present

## 2021-12-17 NOTE — Telephone Encounter (Signed)
  Per MyChart scheduling message:   Patient c/o Palpitations:  High priority if patient c/o lightheadedness, shortness of breath, or chest pain  How long have you had palpitations/irregular HR/ Afib? Are you having the symptoms now?   Are you currently experiencing lightheadedness, SOB or CP?   Do you have a history of afib (atrial fibrillation) or irregular heart rhythm?   Have you checked your BP or HR? (document readings if available):   Are you experiencing any other symptoms?    Patient's response was "yes I am having symptoms now"

## 2021-12-17 NOTE — Telephone Encounter (Signed)
Pt calling today because he was told at dialysis his HR was fast, around 130 bpm .  He reports the dialysis MD told him to stop his "blood pressure" medication about 1 week ago d/t a low BP.  Medication list also includes amlodipine and  losartan.  He was since told of the elevated HR and put together that his discontinuance of the Metoprolol is what has led to the elevated HR.  Pt reports he restarted Metoprolol today.  He is unsure of his current HR.  He does not have any palpitation nor does he feel as if his HR is irregular.  His only symptom is feeling "jittery sometimes."  Advised pt to continue medication as ordered.  Urged him to check his BP and HR daily and if he is having any s/s.  Requested he notify the office if HR remains elevated or BP becomes too low.  He will keep a log of VS and bring them with him to his appt with Dr Johney Frame on 6/22.  Pt will call back prior to then if any questions/concerns/needs.  Pt expressed gratitude for the follow up call, instructions and appt.  Will forward to Dr Johney Frame and her nurse for their knowledge.

## 2021-12-19 NOTE — Telephone Encounter (Signed)
Great plan! Thank you!!

## 2021-12-20 DIAGNOSIS — N2581 Secondary hyperparathyroidism of renal origin: Secondary | ICD-10-CM | POA: Diagnosis not present

## 2021-12-20 DIAGNOSIS — D689 Coagulation defect, unspecified: Secondary | ICD-10-CM | POA: Diagnosis not present

## 2021-12-20 DIAGNOSIS — Z992 Dependence on renal dialysis: Secondary | ICD-10-CM | POA: Diagnosis not present

## 2021-12-20 DIAGNOSIS — D509 Iron deficiency anemia, unspecified: Secondary | ICD-10-CM | POA: Diagnosis not present

## 2021-12-20 DIAGNOSIS — N186 End stage renal disease: Secondary | ICD-10-CM | POA: Diagnosis not present

## 2021-12-20 DIAGNOSIS — R52 Pain, unspecified: Secondary | ICD-10-CM | POA: Diagnosis not present

## 2021-12-20 DIAGNOSIS — T7840XA Allergy, unspecified, initial encounter: Secondary | ICD-10-CM | POA: Diagnosis not present

## 2021-12-20 DIAGNOSIS — D631 Anemia in chronic kidney disease: Secondary | ICD-10-CM | POA: Diagnosis not present

## 2021-12-21 NOTE — Progress Notes (Deleted)
Cardiology Office Note:    Date:  12/21/2021   ID:  Devon Cook, DOB Dec 01, 1960, MRN [id number]  PCP:  [name], MD  [facility] Cardiologist:  Freada Bergeron, MD  [facility] Electrophysiologist:  None   Referring MD: [name], MD    History of Present Illness:    Devon Cook is a 62 y.o. male with a hx of ESRD on HD, metastatic prostate cancer, HTN, HLD, OSA on CPAP, and Afib who  presents to clinic for follow-up.  Was last seen in 04/2020 for pre-op evaluation. He was doing well at that time. Of note, patient was in the ED on 10/05/2019 with palpitations that developed at HD. Thought to have SVT vs aflutter. Strips look like Aflutter on my review. His CHADs-vasc was deemed to be 1 at that time and he was continued on ASA. He was supposed to have Cardiology follow-up, but unfortunately this did not occur. We obtained TTE 04/27/20 which showed LVEF 45-50%, G1DD, normal RV, mild MR, ascending aorta 73m. He was lost to follow-up.  Today, ***   Past Medical History:  Diagnosis Date   Anemia, chronic renal failure    followed by dr uHollie Salk-- dx 05/ 2019---  treated with Iron infusions and procrit   Bilateral hydronephrosis urologist-- dr mTresa Moore  malignant-- chronic;  treated with ureteral stents   Chronic gastritis    07-17-2019 and duodenitis   ED (erectile dysfunction)    Edema of both lower extremities    occ le edema wears compresssion hose daily   End stage renal failure on dialysis ([facility]    [facility]rolog[facility] secondary to obstructive uropathy secon  dary to prostate cancer hemodialysis mon/wed/fri @ [facility]---started first week of Nov 2020   GERD (gastroesophageal reflux disease)    Gout 2019   Herpes genitalis in men    History of adenomatous polyp of colon    History of COVID-19 07/2020   History of hepatitis C 2017   dx 11/ 207-- treated with Harvoni (in epic 04/ 2019 lab result  non-detectable)   History of lower GI bleeding 11/2017   Hypertension    followed by pcp   OSA (obstructive sleep apnea)    severe osa w/ nocturnal hyoxemia per study 06-19-2019,  pt given cpap ( however on 08-30-2019 per pt having issues with mask fitting and on a new one so he not using the cpap);   (03-20-2020 still not using cpap, no mask)   Palpitations    isolated event at ED 10-05-2019 Aflutter/ Afib versus SVT ;   refer to pt's cardiologist note w/ Dr PJohney Frame  Prostate 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 ([facility] urologist-- dr mTresa Moore  dx 06/ 2019-- advanced w/ mets to bone and pelvic lymphadeopathy   S/P arteriovenous (AV) fistula creation followed by dr cCarlis Abbott(vascular)   03-15-2019  by dr cCarlis Abbott left branhiocephallic   Secondary hyperparathyroidism of renal origin ([facility]    Wears glasses     Past Surgical History:  Procedure Laterality Date   A/V FISTULAGRAM Left 09/04/2020   Procedure: A/V FISTULAGRAM;  Surgeon: DAngelia Mould MD;  [facility];  Service: Cardiovascular;  Laterality: Left;   AV FISTULA PLACEMENT Left 03/15/2019   Procedure: ARTERIOVENOUS (AV) FISTULA CREATION LEFT ARM;  Surgeon: CMarty Heck MD;  [facility]  Service: Vascular;  Laterality: Left;   COLONOSCOPY     CYSTOSCOPY W/  URETERAL STENT PLACEMENT Bilateral 12/08/2017   Procedure: CYSTOSCOPY WITH RETROGRADE PYELOGRAM/URETERAL STENT PLACEMENT;  Surgeon: Alexis Frock, MD;  [facility];  Service: Urology;  Laterality: Bilateral;   CYSTOSCOPY W/ URETERAL STENT PLACEMENT Bilateral 04/20/2018   Procedure: CYSTOSCOPY WITH STENT REPLACEMENT;  Surgeon: Alexis Frock, MD;  Location: [facility];  Service: Urology;  Laterality: Bilateral;   CYSTOSCOPY W/ URETERAL STENT PLACEMENT Bilateral 08/10/2018   Procedure: CYSTOSCOPY WITH STENT REPLACEMENT, BILATERAL RETROGRADES;  Surgeon: Alexis Frock, MD;  Location: [facility];  Service: Urology;   Laterality: Bilateral;  45 MINS   CYSTOSCOPY W/ URETERAL STENT PLACEMENT Bilateral 01/18/2019   Procedure: CYSTOSCOPY WITH RETROGRADE PYELOGRAM/URETERAL STENT PLACEMENT;  Surgeon: Alexis Frock, MD;  Location: [facility];  Service: Urology;  Laterality: Bilateral;   CYSTOSCOPY W/ URETERAL STENT PLACEMENT Bilateral 05/24/2019   Procedure: CYSTOSCOPY WITH RETROGRADE PYELOGRAM/BILATERAL URETERAL STENT EXCHANGE;  Surgeon: Alexis Frock, MD;  [facility];  Service: Urology;  Laterality: Bilateral;   CYSTOSCOPY W/ URETERAL STENT PLACEMENT Bilateral 09/06/2019   Procedure: CYSTOSCOPY WITH RETROGRADE PYELOGRAM/URETERAL STENT EXCHANGE;  Surgeon: Alexis Frock, MD;  Location: [facility];  Service: Urology;  Laterality: Bilateral;  45 MINS   CYSTOSCOPY W/ URETERAL STENT PLACEMENT Bilateral 05/08/2020   Procedure: CYSTOSCOPY WITH RETROGRADE PYELOGRAM/URETERAL STENT REPLACEMENT;  Surgeon: Alexis Frock, MD;  Location: [facility];  Service: Urology;  Laterality: Bilateral;   CYSTOSCOPY W/ URETERAL STENT PLACEMENT Bilateral 06/04/2021   Procedure: CYSTOSCOPY WITH RETROGRADE PYELOGRAM/URETERAL STENT EXCHANGE;  Surgeon: Alexis Frock, MD;  Location: [facility];  Service: Urology;  Laterality: Bilateral;  45 MINS   INSERTION OF DIALYSIS CATHETER Right 05/06/2019   Procedure: INSERTION OF TUNNELED  DIALYSIS CATHETER;  Surgeon: Rosetta Posner, MD;  Location: [place];  Service: Vascular;  Laterality: Right;   IR NEPHROSTOMY PLACEMENT LEFT  11/15/2017   IR NEPHROSTOMY PLACEMENT RIGHT  11/15/2017   PERIPHERAL VASCULAR INTERVENTION Left 09/04/2020   Procedure: PERIPHERAL VASCULAR INTERVENTION;  Surgeon: Angelia Mould, MD;  Location: [place] [facility];  Service: Cardiovascular;  Laterality: Left;   PROSTATE BIOPSY N/A 12/08/2017   Procedure: BIOPSY TRANSRECTAL ULTRASONIC PROSTATE (TUBP), PERINEAL BIOPSY;  Surgeon: Alexis Frock,  MD;  [facility];  Service: Urology;  Laterality: N/A;   TRANSURETHRAL RESECTION OF BLADDER TUMOR N/A 12/08/2017   Procedure: TRANSURETHRAL RESECTION OF BLADDER TUMOR (TURBT);  Surgeon: Alexis Frock, MD;  [facility];  Service: Urology;  Laterality: N/A;   TRICEPS TENDON REPAIR Bilateral 08/2005   UPPER GASTROINTESTINAL ENDOSCOPY      Current Medications: No outpatient medications have been marked as taking for the 12/23/21 encounter (Appointment) with Freada Bergeron, MD.     Allergies:   Patient has no known allergies.   Social History   Socioeconomic History   Marital status: Married    Spouse name: Not on file   Number of children: 5   Years of education: Not on file   Highest education level: Not on file  Occupational History   Occupation: retired  Tobacco Use   Smoking status: Some Days    Packs/day: 0.50    Years: 6.00    Total pack years: 3.00    Types: Cigarettes, Pipe, Cigars   Smokeless tobacco: Never   Tobacco comments:      smokes some days cigarettes as of 05-25-2021  Vaping Use   Vaping Use: Never used  Substance and Sexual Activity   Alcohol use: Yes  Comment: 2 drinks per weekend   Drug use: No   Sexual activity: Yes  Other Topics Concern   Not on file  Social History Narrative   Mon/wed Friday hemodialysis at [facility] on [address] [place]   Dr Hollie Salk nephrologist   Left arm fistula   Current uses right ij catheter for dialysis   Still makes some urine   Social Determinants of Health   Financial Resource Strain: Not on file  Food Insecurity: Not on file  Transportation Needs: Not on file  Physical Activity: Not on file  Stress: Not on file  Social Connections: Not on file     Family History: The patient's family history includes Diabetes in his brother, sister, and sister; Heart disease in his mother; Hypertension in his mother; Prostate cancer in his maternal grandfather and maternal uncle. There is  no history of Colon polyps, Esophageal cancer, Stomach cancer, or Rectal cancer.  ROS:   Please see the history of present illness.    The patient denies chest pain, chest pressure, dyspnea at rest or with exertion, palpitations, PND, orthopnea, or leg swelling. Denies cough, fever, chills. Denies nausea, vomiting. Denies syncope or presyncope. Denies dizziness or lightheadedness. Denies snoring.  EKGs/Labs/Other Studies Reviewed:    The following studies were reviewed today: TTE 05-08-20: IMPRESSIONS     1. Left ventricular ejection fraction, by estimation, is 45 to 50%. The  left ventricle has mildly decreased function. The left ventricle  demonstrates global hypokinesis. The left ventricular internal cavity size  was moderately dilated. There is mild  concentric left ventricular hypertrophy. Left ventricular diastolic  parameters are consistent with Grade I diastolic dysfunction (impaired  relaxation). Elevated left atrial pressure.   2. Right ventricular systolic function is normal. The right ventricular  size is normal. There is moderately elevated pulmonary artery systolic  pressure. The estimated right ventricular systolic pressure is 95.1 mmHg.   3. Left atrial size was moderately dilated.   4. Right atrial size was mildly dilated.   5. The mitral valve is normal in structure. Mild mitral valve  regurgitation. No evidence of mitral stenosis.   6. The aortic valve is normal in structure. Aortic valve regurgitation is  not visualized. No aortic stenosis is present.   7. Aortic dilatation noted. There is mild dilatation of the ascending  aorta, measuring 42 mm.   8. The inferior vena cava is dilated in size with <50% respiratory  variability, suggesting right atrial pressure of 15 mmHg.  Sleep study 06/2019: IMPRESSIONS  - Severe obstructive sleep apnea occurred during this study (AHI  = 99.8/h).  - No significant central sleep apnea occurred during this study  (CAI =  0.0/h).  - Oxygen desaturation was noted during this study (Min O2 = 64%).  Mean sat 92%.  - Time with O2 saturation 89% or less was 79.9 minutes.  - Patient snored.   LE Ultrasound 11/2017: Right: There is no evidence of superficial venous thrombosis.There is no  evidence of deep vein thrombosis in the lower extremity. However, portions  of this examination were limited- see technologist comments above. No  cystic structure found in the  popliteal fossa.  Left: There is no evidence of deep vein thrombosis in the lower extremity.  However, portions of this examination were limited- see technologist  comments above.There is no evidence of superficial venous thrombosis. No  cystic structure found in the  popliteal fossa.   EKG:  EKG is  ordered today.  The ekg  ordered today demonstrates NSR with HR 86  Recent Labs: 06/04/2021: Hemoglobin 12.9 08/23/2021: ALT 15; BUN 26; Creatinine, Ser 7.47; Potassium 4.4; Sodium 138  Recent Lipid Panel    Component Value Date/Time   CHOL 98 (L) 11/13/2017 0959   TRIG 72 11/13/2017 0959   HDL 32 (L) 11/13/2017 0959   CHOLHDL 3.1 11/13/2017 0959   CHOLHDL 2.8 09/07/2015 0001   VLDL 19 09/07/2015 0001   LDLCALC 52 11/13/2017 0959    Physical Exam:    VS:  There were no vitals taken for this visit.    Wt Readings from Last 3 Encounters:  11/04/21 290 lb (131.5 kg)  09/23/21 290 lb 9.6 oz (131.8 kg)  08/23/21 283 lb 12.8 oz (128.7 kg)     GEN:  Well nourished, well developed in no acute distress HEENT: Normal NECK: No JVD; No carotid bruits LYMPHATICS: No lymphadenopathy CARDIAC: RRR, no murmurs, rubs, gallops RESPIRATORY:  Clear to auscultation without rales, wheezing or rhonchi  ABDOMEN: Soft, non-tender, non-distended MUSCULOSKELETAL:  No edema; No deformity  SKIN: Warm and dry NEUROLOGIC:  Alert and oriented x 3 PSYCHIATRIC:  Normal affect   ASSESSMENT:    No diagnosis found. PLAN:    In order of problems listed  above:  #Chronic Systolic HF with Mildly Reduced LVEF: TTE 04/2020 with LVEF 45-50% with global hypokinesis. No known history of CAD and no anginal symptoms. Currently euvolemic and compensated. -? Lexiscan -Continue losartan '100mg'$  daily -Continue metop '25mg'$  XL daily  #Palpitations: #Suspected Aflutter: Isolated event per ED report with no further issues. CHADs-vasc 1 for hypertension although ESRD does increase risk and may merit anticoagulation if any further episodes.  -Continue metop '25mg'$  XL -Continue ASA  #HTN: *** -Continue amlodipine '10mg'$   -Continue metoprolol '25mg'$  XL -Continue losartan '100mg'$  dialy  #ESRD on HD: #Metastatic prostate cancer Secondary to obstructive uropathy in the setting of metastatic prostate cancer. -Continue HD as scheduled -Follow-up with urology as scheduled  Medication Adjustments/Labs and Tests Ordered: Current medicines are reviewed at length with the patient today.  Concerns regarding medicines are outlined above.  No orders of the defined types were placed in this encounter.  No orders of the defined types were placed in this encounter.   There are no Patient Instructions on file for this visit.   Signed, Freada Bergeron, MD  12/21/2021 3:19 PM    [place]

## 2021-12-22 DIAGNOSIS — N2581 Secondary hyperparathyroidism of renal origin: Secondary | ICD-10-CM | POA: Diagnosis not present

## 2021-12-22 DIAGNOSIS — R52 Pain, unspecified: Secondary | ICD-10-CM | POA: Diagnosis not present

## 2021-12-22 DIAGNOSIS — Z992 Dependence on renal dialysis: Secondary | ICD-10-CM | POA: Diagnosis not present

## 2021-12-22 DIAGNOSIS — N186 End stage renal disease: Secondary | ICD-10-CM | POA: Diagnosis not present

## 2021-12-22 DIAGNOSIS — D689 Coagulation defect, unspecified: Secondary | ICD-10-CM | POA: Diagnosis not present

## 2021-12-22 DIAGNOSIS — D509 Iron deficiency anemia, unspecified: Secondary | ICD-10-CM | POA: Diagnosis not present

## 2021-12-22 DIAGNOSIS — D631 Anemia in chronic kidney disease: Secondary | ICD-10-CM | POA: Diagnosis not present

## 2021-12-22 DIAGNOSIS — T7840XA Allergy, unspecified, initial encounter: Secondary | ICD-10-CM | POA: Diagnosis not present

## 2021-12-23 ENCOUNTER — Ambulatory Visit: Payer: Medicare Other | Admitting: Cardiology

## 2021-12-24 DIAGNOSIS — D509 Iron deficiency anemia, unspecified: Secondary | ICD-10-CM | POA: Diagnosis not present

## 2021-12-24 DIAGNOSIS — T7840XA Allergy, unspecified, initial encounter: Secondary | ICD-10-CM | POA: Diagnosis not present

## 2021-12-24 DIAGNOSIS — N2581 Secondary hyperparathyroidism of renal origin: Secondary | ICD-10-CM | POA: Diagnosis not present

## 2021-12-24 DIAGNOSIS — Z992 Dependence on renal dialysis: Secondary | ICD-10-CM | POA: Diagnosis not present

## 2021-12-24 DIAGNOSIS — N186 End stage renal disease: Secondary | ICD-10-CM | POA: Diagnosis not present

## 2021-12-24 DIAGNOSIS — R52 Pain, unspecified: Secondary | ICD-10-CM | POA: Diagnosis not present

## 2021-12-24 DIAGNOSIS — D689 Coagulation defect, unspecified: Secondary | ICD-10-CM | POA: Diagnosis not present

## 2021-12-24 DIAGNOSIS — D631 Anemia in chronic kidney disease: Secondary | ICD-10-CM | POA: Diagnosis not present

## 2021-12-27 DIAGNOSIS — D631 Anemia in chronic kidney disease: Secondary | ICD-10-CM | POA: Diagnosis not present

## 2021-12-27 DIAGNOSIS — N186 End stage renal disease: Secondary | ICD-10-CM | POA: Diagnosis not present

## 2021-12-27 DIAGNOSIS — Z992 Dependence on renal dialysis: Secondary | ICD-10-CM | POA: Diagnosis not present

## 2021-12-27 DIAGNOSIS — D689 Coagulation defect, unspecified: Secondary | ICD-10-CM | POA: Diagnosis not present

## 2021-12-27 DIAGNOSIS — R52 Pain, unspecified: Secondary | ICD-10-CM | POA: Diagnosis not present

## 2021-12-27 DIAGNOSIS — T7840XA Allergy, unspecified, initial encounter: Secondary | ICD-10-CM | POA: Diagnosis not present

## 2021-12-27 DIAGNOSIS — N2581 Secondary hyperparathyroidism of renal origin: Secondary | ICD-10-CM | POA: Diagnosis not present

## 2021-12-27 DIAGNOSIS — D509 Iron deficiency anemia, unspecified: Secondary | ICD-10-CM | POA: Diagnosis not present

## 2021-12-28 DIAGNOSIS — N186 End stage renal disease: Secondary | ICD-10-CM | POA: Diagnosis not present

## 2021-12-28 DIAGNOSIS — N2581 Secondary hyperparathyroidism of renal origin: Secondary | ICD-10-CM | POA: Diagnosis not present

## 2021-12-28 DIAGNOSIS — T7840XA Allergy, unspecified, initial encounter: Secondary | ICD-10-CM | POA: Diagnosis not present

## 2021-12-28 DIAGNOSIS — Z992 Dependence on renal dialysis: Secondary | ICD-10-CM | POA: Diagnosis not present

## 2021-12-28 DIAGNOSIS — R52 Pain, unspecified: Secondary | ICD-10-CM | POA: Diagnosis not present

## 2021-12-28 DIAGNOSIS — D509 Iron deficiency anemia, unspecified: Secondary | ICD-10-CM | POA: Diagnosis not present

## 2021-12-28 DIAGNOSIS — D689 Coagulation defect, unspecified: Secondary | ICD-10-CM | POA: Diagnosis not present

## 2021-12-28 DIAGNOSIS — D631 Anemia in chronic kidney disease: Secondary | ICD-10-CM | POA: Diagnosis not present

## 2021-12-29 DIAGNOSIS — D631 Anemia in chronic kidney disease: Secondary | ICD-10-CM | POA: Diagnosis not present

## 2021-12-29 DIAGNOSIS — R52 Pain, unspecified: Secondary | ICD-10-CM | POA: Diagnosis not present

## 2021-12-29 DIAGNOSIS — D509 Iron deficiency anemia, unspecified: Secondary | ICD-10-CM | POA: Diagnosis not present

## 2021-12-29 DIAGNOSIS — T7840XA Allergy, unspecified, initial encounter: Secondary | ICD-10-CM | POA: Diagnosis not present

## 2021-12-29 DIAGNOSIS — N186 End stage renal disease: Secondary | ICD-10-CM | POA: Diagnosis not present

## 2021-12-29 DIAGNOSIS — N2581 Secondary hyperparathyroidism of renal origin: Secondary | ICD-10-CM | POA: Diagnosis not present

## 2021-12-29 DIAGNOSIS — D689 Coagulation defect, unspecified: Secondary | ICD-10-CM | POA: Diagnosis not present

## 2021-12-29 DIAGNOSIS — Z992 Dependence on renal dialysis: Secondary | ICD-10-CM | POA: Diagnosis not present

## 2021-12-31 DIAGNOSIS — T7840XA Allergy, unspecified, initial encounter: Secondary | ICD-10-CM | POA: Diagnosis not present

## 2021-12-31 DIAGNOSIS — D631 Anemia in chronic kidney disease: Secondary | ICD-10-CM | POA: Diagnosis not present

## 2021-12-31 DIAGNOSIS — N2581 Secondary hyperparathyroidism of renal origin: Secondary | ICD-10-CM | POA: Diagnosis not present

## 2021-12-31 DIAGNOSIS — D509 Iron deficiency anemia, unspecified: Secondary | ICD-10-CM | POA: Diagnosis not present

## 2021-12-31 DIAGNOSIS — D689 Coagulation defect, unspecified: Secondary | ICD-10-CM | POA: Diagnosis not present

## 2021-12-31 DIAGNOSIS — R52 Pain, unspecified: Secondary | ICD-10-CM | POA: Diagnosis not present

## 2021-12-31 DIAGNOSIS — N138 Other obstructive and reflux uropathy: Secondary | ICD-10-CM | POA: Diagnosis not present

## 2021-12-31 DIAGNOSIS — Z992 Dependence on renal dialysis: Secondary | ICD-10-CM | POA: Diagnosis not present

## 2021-12-31 DIAGNOSIS — N186 End stage renal disease: Secondary | ICD-10-CM | POA: Diagnosis not present

## 2022-01-03 DIAGNOSIS — R52 Pain, unspecified: Secondary | ICD-10-CM | POA: Diagnosis not present

## 2022-01-03 DIAGNOSIS — T7840XA Allergy, unspecified, initial encounter: Secondary | ICD-10-CM | POA: Diagnosis not present

## 2022-01-03 DIAGNOSIS — N2581 Secondary hyperparathyroidism of renal origin: Secondary | ICD-10-CM | POA: Diagnosis not present

## 2022-01-03 DIAGNOSIS — Z992 Dependence on renal dialysis: Secondary | ICD-10-CM | POA: Diagnosis not present

## 2022-01-03 DIAGNOSIS — D631 Anemia in chronic kidney disease: Secondary | ICD-10-CM | POA: Diagnosis not present

## 2022-01-03 DIAGNOSIS — N186 End stage renal disease: Secondary | ICD-10-CM | POA: Diagnosis not present

## 2022-01-03 DIAGNOSIS — D689 Coagulation defect, unspecified: Secondary | ICD-10-CM | POA: Diagnosis not present

## 2022-01-05 DIAGNOSIS — D631 Anemia in chronic kidney disease: Secondary | ICD-10-CM | POA: Diagnosis not present

## 2022-01-05 DIAGNOSIS — Z992 Dependence on renal dialysis: Secondary | ICD-10-CM | POA: Diagnosis not present

## 2022-01-05 DIAGNOSIS — D689 Coagulation defect, unspecified: Secondary | ICD-10-CM | POA: Diagnosis not present

## 2022-01-05 DIAGNOSIS — N2581 Secondary hyperparathyroidism of renal origin: Secondary | ICD-10-CM | POA: Diagnosis not present

## 2022-01-05 DIAGNOSIS — N186 End stage renal disease: Secondary | ICD-10-CM | POA: Diagnosis not present

## 2022-01-05 DIAGNOSIS — T7840XA Allergy, unspecified, initial encounter: Secondary | ICD-10-CM | POA: Diagnosis not present

## 2022-01-05 DIAGNOSIS — R52 Pain, unspecified: Secondary | ICD-10-CM | POA: Diagnosis not present

## 2022-01-07 DIAGNOSIS — R52 Pain, unspecified: Secondary | ICD-10-CM | POA: Diagnosis not present

## 2022-01-07 DIAGNOSIS — T7840XA Allergy, unspecified, initial encounter: Secondary | ICD-10-CM | POA: Diagnosis not present

## 2022-01-07 DIAGNOSIS — Z992 Dependence on renal dialysis: Secondary | ICD-10-CM | POA: Diagnosis not present

## 2022-01-07 DIAGNOSIS — D689 Coagulation defect, unspecified: Secondary | ICD-10-CM | POA: Diagnosis not present

## 2022-01-07 DIAGNOSIS — N186 End stage renal disease: Secondary | ICD-10-CM | POA: Diagnosis not present

## 2022-01-07 DIAGNOSIS — N2581 Secondary hyperparathyroidism of renal origin: Secondary | ICD-10-CM | POA: Diagnosis not present

## 2022-01-07 DIAGNOSIS — D631 Anemia in chronic kidney disease: Secondary | ICD-10-CM | POA: Diagnosis not present

## 2022-01-10 DIAGNOSIS — Z992 Dependence on renal dialysis: Secondary | ICD-10-CM | POA: Diagnosis not present

## 2022-01-10 DIAGNOSIS — N186 End stage renal disease: Secondary | ICD-10-CM | POA: Diagnosis not present

## 2022-01-10 DIAGNOSIS — D689 Coagulation defect, unspecified: Secondary | ICD-10-CM | POA: Diagnosis not present

## 2022-01-10 DIAGNOSIS — N2581 Secondary hyperparathyroidism of renal origin: Secondary | ICD-10-CM | POA: Diagnosis not present

## 2022-01-10 DIAGNOSIS — D631 Anemia in chronic kidney disease: Secondary | ICD-10-CM | POA: Diagnosis not present

## 2022-01-10 DIAGNOSIS — T7840XA Allergy, unspecified, initial encounter: Secondary | ICD-10-CM | POA: Diagnosis not present

## 2022-01-10 DIAGNOSIS — R52 Pain, unspecified: Secondary | ICD-10-CM | POA: Diagnosis not present

## 2022-01-12 DIAGNOSIS — T7840XA Allergy, unspecified, initial encounter: Secondary | ICD-10-CM | POA: Diagnosis not present

## 2022-01-12 DIAGNOSIS — D631 Anemia in chronic kidney disease: Secondary | ICD-10-CM | POA: Diagnosis not present

## 2022-01-12 DIAGNOSIS — N2581 Secondary hyperparathyroidism of renal origin: Secondary | ICD-10-CM | POA: Diagnosis not present

## 2022-01-12 DIAGNOSIS — D689 Coagulation defect, unspecified: Secondary | ICD-10-CM | POA: Diagnosis not present

## 2022-01-12 DIAGNOSIS — Z992 Dependence on renal dialysis: Secondary | ICD-10-CM | POA: Diagnosis not present

## 2022-01-12 DIAGNOSIS — N186 End stage renal disease: Secondary | ICD-10-CM | POA: Diagnosis not present

## 2022-01-12 DIAGNOSIS — R52 Pain, unspecified: Secondary | ICD-10-CM | POA: Diagnosis not present

## 2022-01-13 ENCOUNTER — Ambulatory Visit (HOSPITAL_COMMUNITY)
Admission: RE | Admit: 2022-01-13 | Discharge: 2022-01-13 | Disposition: A | Discharge status: Home / Self Care | Payer: Medicare Other | Source: Ambulatory Visit | Attending: Nephrology | Admitting: Nephrology

## 2022-01-13 ENCOUNTER — Encounter (HOSPITAL_COMMUNITY): Payer: Self-pay

## 2022-01-13 ENCOUNTER — Other Ambulatory Visit (HOSPITAL_COMMUNITY): Payer: Self-pay | Admitting: Urology

## 2022-01-13 ENCOUNTER — Other Ambulatory Visit: Payer: Self-pay | Admitting: Urology

## 2022-01-13 VITALS — BP 136/89 | HR 98 | Temp 98.4°F | Resp 16

## 2022-01-13 DIAGNOSIS — D649 Anemia, unspecified: Secondary | ICD-10-CM | POA: Diagnosis not present

## 2022-01-13 DIAGNOSIS — C61 Malignant neoplasm of prostate: Secondary | ICD-10-CM

## 2022-01-13 LAB — PREPARE RBC (CROSSMATCH)

## 2022-01-13 MED ORDER — DIPHENHYDRAMINE HCL 25 MG PO CAPS
25.0000 mg | ORAL_CAPSULE | Freq: Once | ORAL | Status: AC
  Administered 2022-01-13: 25 mg via ORAL

## 2022-01-13 MED ORDER — DIPHENHYDRAMINE HCL 25 MG PO CAPS
ORAL_CAPSULE | ORAL | Status: AC
  Filled 2022-01-13: qty 1

## 2022-01-13 MED ORDER — ACETAMINOPHEN 325 MG PO TABS
ORAL_TABLET | ORAL | Status: AC
  Filled 2022-01-13: qty 2

## 2022-01-13 MED ORDER — ACETAMINOPHEN 325 MG PO TABS
650.0000 mg | ORAL_TABLET | Freq: Once | ORAL | Status: AC
Start: 2022-01-13 — End: 2022-01-13
  Administered 2022-01-13: 650 mg via ORAL

## 2022-01-13 MED ORDER — SODIUM CHLORIDE 0.9% IV SOLUTION: Freq: Once | INTRAVENOUS | Status: DC

## 2022-01-14 DIAGNOSIS — T7840XA Allergy, unspecified, initial encounter: Secondary | ICD-10-CM | POA: Diagnosis not present

## 2022-01-14 DIAGNOSIS — D689 Coagulation defect, unspecified: Secondary | ICD-10-CM | POA: Diagnosis not present

## 2022-01-14 DIAGNOSIS — N2581 Secondary hyperparathyroidism of renal origin: Secondary | ICD-10-CM | POA: Diagnosis not present

## 2022-01-14 DIAGNOSIS — N186 End stage renal disease: Secondary | ICD-10-CM | POA: Diagnosis not present

## 2022-01-14 DIAGNOSIS — R52 Pain, unspecified: Secondary | ICD-10-CM | POA: Diagnosis not present

## 2022-01-14 DIAGNOSIS — Z992 Dependence on renal dialysis: Secondary | ICD-10-CM | POA: Diagnosis not present

## 2022-01-14 DIAGNOSIS — D631 Anemia in chronic kidney disease: Secondary | ICD-10-CM | POA: Diagnosis not present

## 2022-01-14 LAB — TYPE AND SCREEN
ABO/RH(D): B POS
Antibody Screen: NEGATIVE
Unit division: 0

## 2022-01-14 LAB — BPAM RBC
Blood Product Expiration Date: 202308052359
ISSUE DATE / TIME: 202307130944
Unit Type and Rh: 7300

## 2022-01-15 NOTE — Progress Notes (Deleted)
Cardiology Office Note:    Date:  01/15/2022   ID:  Devon Cook, DOB 1960/11/18, MRN [id number]  PCP:  [name], MD  [facility] Cardiologist:  Freada Bergeron, MD  [facility] Electrophysiologist:  None   Referring MD: [name], MD    History of Present Illness:    Devon Cook is a 61 y.o. male with a hx of ESRD on HD, metastatic prostate cancer, HTN, HLD, OSA on CPAP, and Afib who  presents to clinic for follow-up.  Was last seen in 04/2020 for pre-op evaluation. He was doing well at that time. Of note, patient was in the ED on 10/05/2019 with palpitations that developed at HD. Thought to have SVT vs aflutter. Strips look like Aflutter on my review. His CHADs-vasc was deemed to be 1 at that time and he was continued on ASA. He was supposed to have Cardiology follow-up, but unfortunately this did not occur. We obtained TTE 04/27/20 which showed LVEF 45-50%, G1DD, normal RV, mild MR, ascending aorta 2m. He was lost to follow-up.  Today, ***   Past Medical History:  Diagnosis Date   Anemia, chronic renal failure    followed by dr uHollie Salk-- dx 05/ 2019---  treated with Iron infusions and procrit   Bilateral hydronephrosis urologist-- dr mTresa Moore  malignant-- chronic;  treated with ureteral stents   Chronic gastritis    07-17-2019 and duodenitis   ED (erectile dysfunction)    Edema of both lower extremities    occ le edema wears compresssion hose daily   End stage renal failure on dialysis ([facility]    nephrologist-- [facility] secondary to obstructive uropathy secon  dary to prostate cancer hemodialysis mon/wed/fri @ [facility]---started first week of Nov 2020   GERD (gastroesophageal reflux disease)    Gout 2019   Herpes genitalis in men    History of adenomatous polyp of colon    History of COVID-19 07/2020   History of hepatitis C 2017   dx 11/ 207-- treated with Harvoni (in epic 04/ 2019 lab result  non-detectable)   History of lower GI bleeding 11/2017   Hypertension    followed by pcp   OSA (obstructive sleep apnea)    severe osa w/ nocturnal hyoxemia per study 06-19-2019,  pt given cpap ( however on 08-30-2019 per pt having issues with mask fitting and on a new one so he not using the cpap);   (03-20-2020 still not using cpap, no mask)   Palpitations    isolated event at ED 10-05-2019 Aflutter/ Afib versus SVT ;   refer to pt's cardiologist note w/ Dr PJohney Frame  Prostate 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 ([facility] urologist-- dr mTresa Moore  dx 06/ 2019-- advanced w/ mets to bone and pelvic lymphadeopathy   S/P arteriovenous (AV) fistula creation followed by dr cCarlis Abbott(vascular)   03-15-2019  by dr cCarlis Abbott left branhiocephallic   Secondary hyperparathyroidism of renal origin ([facility]    Wears glasses     Past Surgical History:  Procedure Laterality Date   A/V FISTULAGRAM Left 09/04/2020   Procedure: A/V FISTULAGRAM;  Surgeon: DAngelia Mould MD;  [place] [facility];  Service: Cardiovascular;  Laterality: Left;   AV FISTULA PLACEMENT Left 03/15/2019   Procedure: ARTERIOVENOUS (AV) FISTULA CREATION LEFT ARM;  Surgeon: CMarty Heck MD;  [place]  Service: Vascular;  Laterality: Left;   COLONOSCOPY     CYSTOSCOPY W/  URETERAL STENT PLACEMENT Bilateral 12/08/2017   Procedure: CYSTOSCOPY WITH RETROGRADE PYELOGRAM/URETERAL STENT PLACEMENT;  Surgeon: Alexis Frock, MD;  [facility];  Service: Urology;  Laterality: Bilateral;   CYSTOSCOPY W/ URETERAL STENT PLACEMENT Bilateral 04/20/2018   Procedure: CYSTOSCOPY WITH STENT REPLACEMENT;  Surgeon: Alexis Frock, MD;  Location: [facility];  Service: Urology;  Laterality: Bilateral;   CYSTOSCOPY W/ URETERAL STENT PLACEMENT Bilateral 08/10/2018   Procedure: CYSTOSCOPY WITH STENT REPLACEMENT, BILATERAL RETROGRADES;  Surgeon: Alexis Frock, MD;  Location[facility];  Service: Urology;   Laterality: Bilateral;  45 MINS   CYSTOSCOPY W/ URETERAL STENT PLACEMENT Bilateral 01/18/2019   Procedure: CYSTOSCOPY WITH RETROGRADE PYELOGRAM/URETERAL STENT PLACEMENT;  Surgeon: Alexis Frock, MD;  Location: [facility];  Service: Urology;  Laterality: Bilateral;   CYSTOSCOPY W/ URETERAL STENT PLACEMENT Bilateral 05/24/2019   Procedure: CYSTOSCOPY WITH RETROGRADE PYELOGRAM/BILATERAL URETERAL STENT EXCHANGE;  Surgeon: Alexis Frock, MD;  Location: [facility];  Service: Urology;  Laterality: Bilateral;   CYSTOSCOPY W/ URETERAL STENT PLACEMENT Bilateral 09/06/2019   Procedure: CYSTOSCOPY WITH RETROGRADE PYELOGRAM/URETERAL STENT EXCHANGE;  Surgeon: Alexis Frock, MD;  Location: [facility];  Service: Urology;  Laterality: Bilateral;  45 MINS   CYSTOSCOPY W/ URETERAL STENT PLACEMENT Bilateral 05/08/2020   Procedure: CYSTOSCOPY WITH RETROGRADE PYELOGRAM/URETERAL STENT REPLACEMENT;  Surgeon: Alexis Frock, MD;  Location: [facility];  Service: Urology;  Laterality: Bilateral;   CYSTOSCOPY W/ URETERAL STENT PLACEMENT Bilateral 06/04/2021   Procedure: CYSTOSCOPY WITH RETROGRADE PYELOGRAM/URETERAL STENT EXCHANGE;  Surgeon: Alexis Frock, MD;  Location: [facility];  Service: Urology;  Laterality: Bilateral;  45 MINS   INSERTION OF DIALYSIS CATHETER Right 05/06/2019   Procedure: INSERTION OF TUNNELED  DIALYSIS CATHETER;  Surgeon: Rosetta Posner, MD;  Location: [place];  Service: Vascular;  Laterality: Right;   IR NEPHROSTOMY PLACEMENT LEFT  11/15/2017   IR NEPHROSTOMY PLACEMENT RIGHT  11/15/2017   PERIPHERAL VASCULAR INTERVENTION Left 09/04/2020   Procedure: PERIPHERAL VASCULAR INTERVENTION;  Surgeon: Angelia Mould, MD;  Location: [place] [facility];  Service: Cardiovascular;  Laterality: Left;   PROSTATE BIOPSY N/A 12/08/2017   Procedure: BIOPSY TRANSRECTAL ULTRASONIC PROSTATE (TUBP), PERINEAL BIOPSY;  Surgeon: Alexis Frock,  MD;  [facility];  Service: Urology;  Laterality: N/A;   TRANSURETHRAL RESECTION OF BLADDER TUMOR N/A 12/08/2017   Procedure: TRANSURETHRAL RESECTION OF BLADDER TUMOR (TURBT);  Surgeon: Alexis Frock, MD;  [facility];  Service: Urology;  Laterality: N/A;   TRICEPS TENDON REPAIR Bilateral 08/2005   UPPER GASTROINTESTINAL ENDOSCOPY      Current Medications: No outpatient medications have been marked as taking for the 01/17/22 encounter (Appointment) with Freada Bergeron, MD.     Allergies:   Patient has no known allergies.   Social History   Socioeconomic History   Marital status: Married    Spouse name: Not on file   Number of children: 5   Years of education: Not on file   Highest education level: Not on file  Occupational History   Occupation: retired  Tobacco Use   Smoking status: Some Days    Packs/day: 0.50    Years: 6.00    Total pack years: 3.00    Types: Cigarettes, Pipe, Cigars   Smokeless tobacco: Never   Tobacco comments:      smokes some days cigarettes as of 05-25-2021  Vaping Use   Vaping Use: Never used  Substance and Sexual Activity   Alcohol use: Yes  Comment: 2 drinks per weekend   Drug use: No   Sexual activity: Yes  Other Topics Concern   Not on file  Social History Narrative   Mon/wed Friday hemodialysis at [facility] on [address] [place]   Dr Hollie Salk nephrologist   Left arm fistula   Current uses right ij catheter for dialysis   Still makes some urine   Social Determinants of Health   Financial Resource Strain: Not on file  Food Insecurity: Not on file  Transportation Needs: Not on file  Physical Activity: Not on file  Stress: Not on file  Social Connections: Not on file     Family History: The patient's family history includes Diabetes in his brother, sister, and sister; Heart disease in his mother; Hypertension in his mother; Prostate cancer in his maternal grandfather and maternal uncle. There is  no history of Colon polyps, Esophageal cancer, Stomach cancer, or Rectal cancer.  ROS:   Please see the history of present illness.    The patient denies chest pain, chest pressure, dyspnea at rest or with exertion, palpitations, PND, orthopnea, or leg swelling. Denies cough, fever, chills. Denies nausea, vomiting. Denies syncope or presyncope. Denies dizziness or lightheadedness. Denies snoring.  EKGs/Labs/Other Studies Reviewed:    The following studies were reviewed today: TTE 05-08-20: IMPRESSIONS     1. Left ventricular ejection fraction, by estimation, is 45 to 50%. The  left ventricle has mildly decreased function. The left ventricle  demonstrates global hypokinesis. The left ventricular internal cavity size  was moderately dilated. There is mild  concentric left ventricular hypertrophy. Left ventricular diastolic  parameters are consistent with Grade I diastolic dysfunction (impaired  relaxation). Elevated left atrial pressure.   2. Right ventricular systolic function is normal. The right ventricular  size is normal. There is moderately elevated pulmonary artery systolic  pressure. The estimated right ventricular systolic pressure is 95.1 mmHg.   3. Left atrial size was moderately dilated.   4. Right atrial size was mildly dilated.   5. The mitral valve is normal in structure. Mild mitral valve  regurgitation. No evidence of mitral stenosis.   6. The aortic valve is normal in structure. Aortic valve regurgitation is  not visualized. No aortic stenosis is present.   7. Aortic dilatation noted. There is mild dilatation of the ascending  aorta, measuring 42 mm.   8. The inferior vena cava is dilated in size with <50% respiratory  variability, suggesting right atrial pressure of 15 mmHg.  Sleep study 06/2019: IMPRESSIONS  - Severe obstructive sleep apnea occurred during this study (AHI  = 99.8/h).  - No significant central sleep apnea occurred during this study  (CAI =  0.0/h).  - Oxygen desaturation was noted during this study (Min O2 = 64%).  Mean sat 92%.  - Time with O2 saturation 89% or less was 79.9 minutes.  - Patient snored.   LE Ultrasound 11/2017: Right: There is no evidence of superficial venous thrombosis.There is no  evidence of deep vein thrombosis in the lower extremity. However, portions  of this examination were limited- see technologist comments above. No  cystic structure found in the  popliteal fossa.  Left: There is no evidence of deep vein thrombosis in the lower extremity.  However, portions of this examination were limited- see technologist  comments above.There is no evidence of superficial venous thrombosis. No  cystic structure found in the  popliteal fossa.   EKG:  EKG is  ordered today.  The ekg  ordered today demonstrates NSR with HR 86  Recent Labs: 06/04/2021: Hemoglobin 12.9 08/23/2021: ALT 15; BUN 26; Creatinine, Ser 7.47; Potassium 4.4; Sodium 138  Recent Lipid Panel    Component Value Date/Time   CHOL 98 (L) 11/13/2017 0959   TRIG 72 11/13/2017 0959   HDL 32 (L) 11/13/2017 0959   CHOLHDL 3.1 11/13/2017 0959   CHOLHDL 2.8 09/07/2015 0001   VLDL 19 09/07/2015 0001   LDLCALC 52 11/13/2017 0959    Physical Exam:    VS:  There were no vitals taken for this visit.    Wt Readings from Last 3 Encounters:  11/04/21 290 lb (131.5 kg)  09/23/21 290 lb 9.6 oz (131.8 kg)  08/23/21 283 lb 12.8 oz (128.7 kg)     GEN:  Well nourished, well developed in no acute distress HEENT: Normal NECK: No JVD; No carotid bruits LYMPHATICS: No lymphadenopathy CARDIAC: RRR, no murmurs, rubs, gallops RESPIRATORY:  Clear to auscultation without rales, wheezing or rhonchi  ABDOMEN: Soft, non-tender, non-distended MUSCULOSKELETAL:  No edema; No deformity  SKIN: Warm and dry NEUROLOGIC:  Alert and oriented x 3 PSYCHIATRIC:  Normal affect   ASSESSMENT:    No diagnosis found. PLAN:    In order of problems listed  above:  #Chronic Systolic HF with Mildly Reduced LVEF: TTE 04/2020 with LVEF 45-50% with global hypokinesis. No known history of CAD and no anginal symptoms. Currently euvolemic and compensated. -? Lexiscan -Continue losartan '100mg'$  daily -Continue metop '25mg'$  XL daily  #Palpitations: #Suspected Aflutter: Isolated event per ED report with no further issues. CHADs-vasc 1 for hypertension although ESRD does increase risk and may merit anticoagulation if any further episodes.  -Continue metop '25mg'$  XL -Continue ASA  #HTN: *** -Continue amlodipine '10mg'$   -Continue metoprolol '25mg'$  XL -Continue losartan '100mg'$  dialy  #ESRD on HD: #Metastatic prostate cancer Secondary to obstructive uropathy in the setting of metastatic prostate cancer. -Continue HD as scheduled -Follow-up with urology as scheduled  Medication Adjustments/Labs and Tests Ordered: Current medicines are reviewed at length with the patient today.  Concerns regarding medicines are outlined above.  No orders of the defined types were placed in this encounter.  No orders of the defined types were placed in this encounter.   There are no Patient Instructions on file for this visit.   Signed, Freada Bergeron, MD  01/15/2022 8:12 PM    [place]

## 2022-01-17 ENCOUNTER — Ambulatory Visit: Payer: Medicare Other | Admitting: Cardiology

## 2022-01-17 DIAGNOSIS — R52 Pain, unspecified: Secondary | ICD-10-CM | POA: Diagnosis not present

## 2022-01-17 DIAGNOSIS — T7840XA Allergy, unspecified, initial encounter: Secondary | ICD-10-CM | POA: Diagnosis not present

## 2022-01-17 DIAGNOSIS — D631 Anemia in chronic kidney disease: Secondary | ICD-10-CM | POA: Diagnosis not present

## 2022-01-17 DIAGNOSIS — Z992 Dependence on renal dialysis: Secondary | ICD-10-CM | POA: Diagnosis not present

## 2022-01-17 DIAGNOSIS — N186 End stage renal disease: Secondary | ICD-10-CM | POA: Diagnosis not present

## 2022-01-17 DIAGNOSIS — N2581 Secondary hyperparathyroidism of renal origin: Secondary | ICD-10-CM | POA: Diagnosis not present

## 2022-01-17 DIAGNOSIS — D689 Coagulation defect, unspecified: Secondary | ICD-10-CM | POA: Diagnosis not present

## 2022-01-19 DIAGNOSIS — D689 Coagulation defect, unspecified: Secondary | ICD-10-CM | POA: Diagnosis not present

## 2022-01-19 DIAGNOSIS — N2581 Secondary hyperparathyroidism of renal origin: Secondary | ICD-10-CM | POA: Diagnosis not present

## 2022-01-19 DIAGNOSIS — D631 Anemia in chronic kidney disease: Secondary | ICD-10-CM | POA: Diagnosis not present

## 2022-01-19 DIAGNOSIS — T7840XA Allergy, unspecified, initial encounter: Secondary | ICD-10-CM | POA: Diagnosis not present

## 2022-01-19 DIAGNOSIS — Z992 Dependence on renal dialysis: Secondary | ICD-10-CM | POA: Diagnosis not present

## 2022-01-19 DIAGNOSIS — N186 End stage renal disease: Secondary | ICD-10-CM | POA: Diagnosis not present

## 2022-01-19 DIAGNOSIS — R52 Pain, unspecified: Secondary | ICD-10-CM | POA: Diagnosis not present

## 2022-01-21 DIAGNOSIS — T7840XA Allergy, unspecified, initial encounter: Secondary | ICD-10-CM | POA: Diagnosis not present

## 2022-01-21 DIAGNOSIS — R52 Pain, unspecified: Secondary | ICD-10-CM | POA: Diagnosis not present

## 2022-01-21 DIAGNOSIS — Z992 Dependence on renal dialysis: Secondary | ICD-10-CM | POA: Diagnosis not present

## 2022-01-21 DIAGNOSIS — D689 Coagulation defect, unspecified: Secondary | ICD-10-CM | POA: Diagnosis not present

## 2022-01-21 DIAGNOSIS — N186 End stage renal disease: Secondary | ICD-10-CM | POA: Diagnosis not present

## 2022-01-21 DIAGNOSIS — N2581 Secondary hyperparathyroidism of renal origin: Secondary | ICD-10-CM | POA: Diagnosis not present

## 2022-01-21 DIAGNOSIS — D631 Anemia in chronic kidney disease: Secondary | ICD-10-CM | POA: Diagnosis not present

## 2022-01-24 DIAGNOSIS — Z992 Dependence on renal dialysis: Secondary | ICD-10-CM | POA: Diagnosis not present

## 2022-01-24 DIAGNOSIS — N186 End stage renal disease: Secondary | ICD-10-CM | POA: Diagnosis not present

## 2022-01-24 DIAGNOSIS — D689 Coagulation defect, unspecified: Secondary | ICD-10-CM | POA: Diagnosis not present

## 2022-01-24 DIAGNOSIS — D631 Anemia in chronic kidney disease: Secondary | ICD-10-CM | POA: Diagnosis not present

## 2022-01-24 DIAGNOSIS — N2581 Secondary hyperparathyroidism of renal origin: Secondary | ICD-10-CM | POA: Diagnosis not present

## 2022-01-24 DIAGNOSIS — T7840XA Allergy, unspecified, initial encounter: Secondary | ICD-10-CM | POA: Diagnosis not present

## 2022-01-24 DIAGNOSIS — R52 Pain, unspecified: Secondary | ICD-10-CM | POA: Diagnosis not present

## 2022-01-26 DIAGNOSIS — D631 Anemia in chronic kidney disease: Secondary | ICD-10-CM | POA: Diagnosis not present

## 2022-01-26 DIAGNOSIS — N186 End stage renal disease: Secondary | ICD-10-CM | POA: Diagnosis not present

## 2022-01-26 DIAGNOSIS — Z992 Dependence on renal dialysis: Secondary | ICD-10-CM | POA: Diagnosis not present

## 2022-01-26 DIAGNOSIS — T7840XA Allergy, unspecified, initial encounter: Secondary | ICD-10-CM | POA: Diagnosis not present

## 2022-01-26 DIAGNOSIS — D689 Coagulation defect, unspecified: Secondary | ICD-10-CM | POA: Diagnosis not present

## 2022-01-26 DIAGNOSIS — N2581 Secondary hyperparathyroidism of renal origin: Secondary | ICD-10-CM | POA: Diagnosis not present

## 2022-01-26 DIAGNOSIS — R52 Pain, unspecified: Secondary | ICD-10-CM | POA: Diagnosis not present

## 2022-01-28 DIAGNOSIS — N2581 Secondary hyperparathyroidism of renal origin: Secondary | ICD-10-CM | POA: Diagnosis not present

## 2022-01-28 DIAGNOSIS — D689 Coagulation defect, unspecified: Secondary | ICD-10-CM | POA: Diagnosis not present

## 2022-01-28 DIAGNOSIS — Z992 Dependence on renal dialysis: Secondary | ICD-10-CM | POA: Diagnosis not present

## 2022-01-28 DIAGNOSIS — N186 End stage renal disease: Secondary | ICD-10-CM | POA: Diagnosis not present

## 2022-01-28 DIAGNOSIS — T7840XA Allergy, unspecified, initial encounter: Secondary | ICD-10-CM | POA: Diagnosis not present

## 2022-01-28 DIAGNOSIS — R52 Pain, unspecified: Secondary | ICD-10-CM | POA: Diagnosis not present

## 2022-01-28 DIAGNOSIS — D631 Anemia in chronic kidney disease: Secondary | ICD-10-CM | POA: Diagnosis not present

## 2022-01-31 DIAGNOSIS — Z992 Dependence on renal dialysis: Secondary | ICD-10-CM | POA: Diagnosis not present

## 2022-01-31 DIAGNOSIS — T7840XA Allergy, unspecified, initial encounter: Secondary | ICD-10-CM | POA: Diagnosis not present

## 2022-01-31 DIAGNOSIS — N138 Other obstructive and reflux uropathy: Secondary | ICD-10-CM | POA: Diagnosis not present

## 2022-01-31 DIAGNOSIS — D631 Anemia in chronic kidney disease: Secondary | ICD-10-CM | POA: Diagnosis not present

## 2022-01-31 DIAGNOSIS — N2581 Secondary hyperparathyroidism of renal origin: Secondary | ICD-10-CM | POA: Diagnosis not present

## 2022-01-31 DIAGNOSIS — D689 Coagulation defect, unspecified: Secondary | ICD-10-CM | POA: Diagnosis not present

## 2022-01-31 DIAGNOSIS — N186 End stage renal disease: Secondary | ICD-10-CM | POA: Diagnosis not present

## 2022-01-31 DIAGNOSIS — R52 Pain, unspecified: Secondary | ICD-10-CM | POA: Diagnosis not present

## 2022-02-01 ENCOUNTER — Telehealth: Payer: Self-pay | Admitting: Family Medicine

## 2022-02-01 NOTE — Telephone Encounter (Signed)
Pt called and states that he is suppose to get a blood tranfusion in sept cause his hemglobin is 7.7 per his dialysis office they where unable to get him in until sept, he wants to know if we can get him any sooner since it is that low, I informed him that dr Redmond School was not in the office, he dont want to wait until sept and start feeling bad and end up in the hospital,  Pt can be reached at  [phone]

## 2022-02-02 ENCOUNTER — Telehealth: Payer: Self-pay

## 2022-02-02 ENCOUNTER — Ambulatory Visit: Payer: Medicare Other | Admitting: Medical

## 2022-02-02 DIAGNOSIS — R52 Pain, unspecified: Secondary | ICD-10-CM | POA: Diagnosis not present

## 2022-02-02 DIAGNOSIS — D509 Iron deficiency anemia, unspecified: Secondary | ICD-10-CM | POA: Diagnosis not present

## 2022-02-02 DIAGNOSIS — N2581 Secondary hyperparathyroidism of renal origin: Secondary | ICD-10-CM | POA: Diagnosis not present

## 2022-02-02 DIAGNOSIS — T8243XA Leakage of vascular dialysis catheter, initial encounter: Secondary | ICD-10-CM | POA: Diagnosis not present

## 2022-02-02 DIAGNOSIS — N186 End stage renal disease: Secondary | ICD-10-CM | POA: Diagnosis not present

## 2022-02-02 DIAGNOSIS — D689 Coagulation defect, unspecified: Secondary | ICD-10-CM | POA: Diagnosis not present

## 2022-02-02 DIAGNOSIS — Z992 Dependence on renal dialysis: Secondary | ICD-10-CM | POA: Diagnosis not present

## 2022-02-02 DIAGNOSIS — T7840XA Allergy, unspecified, initial encounter: Secondary | ICD-10-CM | POA: Diagnosis not present

## 2022-02-02 DIAGNOSIS — D631 Anemia in chronic kidney disease: Secondary | ICD-10-CM | POA: Diagnosis not present

## 2022-02-02 DIAGNOSIS — R197 Diarrhea, unspecified: Secondary | ICD-10-CM | POA: Diagnosis not present

## 2022-02-02 DIAGNOSIS — E8770 Fluid overload, unspecified: Secondary | ICD-10-CM | POA: Diagnosis not present

## 2022-02-02 NOTE — Telephone Encounter (Signed)
Pt. Called back to let you know that his Kidney doctor is getting him earlier for his transfusion now on 02/23/22. So he did cancel his apt. With you today.

## 2022-02-03 ENCOUNTER — Encounter (HOSPITAL_COMMUNITY)
Admission: RE | Admit: 2022-02-03 | Discharge: 2022-02-03 | Disposition: A | Discharge status: Home / Self Care | Payer: Medicare Other | Source: Ambulatory Visit | Attending: Urology | Admitting: Urology

## 2022-02-03 DIAGNOSIS — C61 Malignant neoplasm of prostate: Secondary | ICD-10-CM | POA: Insufficient documentation

## 2022-02-03 DIAGNOSIS — C799 Secondary malignant neoplasm of unspecified site: Secondary | ICD-10-CM | POA: Diagnosis not present

## 2022-02-03 DIAGNOSIS — N2889 Other specified disorders of kidney and ureter: Secondary | ICD-10-CM | POA: Diagnosis not present

## 2022-02-03 MED ORDER — TECHNETIUM TC 99M MEDRONATE IV KIT
20.0000 | PACK | Freq: Once | INTRAVENOUS | Status: AC | PRN
  Administered 2022-02-03: 22 via INTRAVENOUS

## 2022-02-04 ENCOUNTER — Telehealth (HOSPITAL_COMMUNITY): Payer: Self-pay

## 2022-02-04 DIAGNOSIS — E8770 Fluid overload, unspecified: Secondary | ICD-10-CM | POA: Diagnosis not present

## 2022-02-04 DIAGNOSIS — N2581 Secondary hyperparathyroidism of renal origin: Secondary | ICD-10-CM | POA: Diagnosis not present

## 2022-02-04 DIAGNOSIS — T7840XA Allergy, unspecified, initial encounter: Secondary | ICD-10-CM | POA: Diagnosis not present

## 2022-02-04 DIAGNOSIS — Z992 Dependence on renal dialysis: Secondary | ICD-10-CM | POA: Diagnosis not present

## 2022-02-04 DIAGNOSIS — R52 Pain, unspecified: Secondary | ICD-10-CM | POA: Diagnosis not present

## 2022-02-04 DIAGNOSIS — D631 Anemia in chronic kidney disease: Secondary | ICD-10-CM | POA: Diagnosis not present

## 2022-02-04 DIAGNOSIS — N186 End stage renal disease: Secondary | ICD-10-CM | POA: Diagnosis not present

## 2022-02-04 DIAGNOSIS — T8243XA Leakage of vascular dialysis catheter, initial encounter: Secondary | ICD-10-CM | POA: Diagnosis not present

## 2022-02-04 DIAGNOSIS — D689 Coagulation defect, unspecified: Secondary | ICD-10-CM | POA: Diagnosis not present

## 2022-02-04 DIAGNOSIS — D509 Iron deficiency anemia, unspecified: Secondary | ICD-10-CM | POA: Diagnosis not present

## 2022-02-04 DIAGNOSIS — R197 Diarrhea, unspecified: Secondary | ICD-10-CM | POA: Diagnosis not present

## 2022-02-04 NOTE — Telephone Encounter (Signed)
Spoke with Arboriculturist at [facility] regarding orders sent for blood transfusion on pt, informed RN that pt will be unable to receive transfusion at the patient care center for 2-3 weeks due to the schedule already being full, and pt will need to be referred elsewhere for transfusion if needed urgently. Katie RN verbalized understanding and states she will relay message to [place] who sent the original faxed order.

## 2022-02-07 DIAGNOSIS — D509 Iron deficiency anemia, unspecified: Secondary | ICD-10-CM | POA: Diagnosis not present

## 2022-02-07 DIAGNOSIS — D689 Coagulation defect, unspecified: Secondary | ICD-10-CM | POA: Diagnosis not present

## 2022-02-07 DIAGNOSIS — E8770 Fluid overload, unspecified: Secondary | ICD-10-CM | POA: Diagnosis not present

## 2022-02-07 DIAGNOSIS — R52 Pain, unspecified: Secondary | ICD-10-CM | POA: Diagnosis not present

## 2022-02-07 DIAGNOSIS — N186 End stage renal disease: Secondary | ICD-10-CM | POA: Diagnosis not present

## 2022-02-07 DIAGNOSIS — T8243XA Leakage of vascular dialysis catheter, initial encounter: Secondary | ICD-10-CM | POA: Diagnosis not present

## 2022-02-07 DIAGNOSIS — Z992 Dependence on renal dialysis: Secondary | ICD-10-CM | POA: Diagnosis not present

## 2022-02-07 DIAGNOSIS — N2581 Secondary hyperparathyroidism of renal origin: Secondary | ICD-10-CM | POA: Diagnosis not present

## 2022-02-07 DIAGNOSIS — D631 Anemia in chronic kidney disease: Secondary | ICD-10-CM | POA: Diagnosis not present

## 2022-02-07 DIAGNOSIS — R197 Diarrhea, unspecified: Secondary | ICD-10-CM | POA: Diagnosis not present

## 2022-02-07 DIAGNOSIS — T7840XA Allergy, unspecified, initial encounter: Secondary | ICD-10-CM | POA: Diagnosis not present

## 2022-02-09 DIAGNOSIS — D689 Coagulation defect, unspecified: Secondary | ICD-10-CM | POA: Diagnosis not present

## 2022-02-09 DIAGNOSIS — R197 Diarrhea, unspecified: Secondary | ICD-10-CM | POA: Diagnosis not present

## 2022-02-09 DIAGNOSIS — T8243XA Leakage of vascular dialysis catheter, initial encounter: Secondary | ICD-10-CM | POA: Diagnosis not present

## 2022-02-09 DIAGNOSIS — N186 End stage renal disease: Secondary | ICD-10-CM | POA: Diagnosis not present

## 2022-02-09 DIAGNOSIS — D509 Iron deficiency anemia, unspecified: Secondary | ICD-10-CM | POA: Diagnosis not present

## 2022-02-09 DIAGNOSIS — Z992 Dependence on renal dialysis: Secondary | ICD-10-CM | POA: Diagnosis not present

## 2022-02-09 DIAGNOSIS — R52 Pain, unspecified: Secondary | ICD-10-CM | POA: Diagnosis not present

## 2022-02-09 DIAGNOSIS — N2581 Secondary hyperparathyroidism of renal origin: Secondary | ICD-10-CM | POA: Diagnosis not present

## 2022-02-09 DIAGNOSIS — E8770 Fluid overload, unspecified: Secondary | ICD-10-CM | POA: Diagnosis not present

## 2022-02-09 DIAGNOSIS — T7840XA Allergy, unspecified, initial encounter: Secondary | ICD-10-CM | POA: Diagnosis not present

## 2022-02-09 DIAGNOSIS — D631 Anemia in chronic kidney disease: Secondary | ICD-10-CM | POA: Diagnosis not present

## 2022-02-10 DIAGNOSIS — N130 Hydronephrosis with ureteropelvic junction obstruction: Secondary | ICD-10-CM | POA: Diagnosis not present

## 2022-02-10 DIAGNOSIS — N186 End stage renal disease: Secondary | ICD-10-CM | POA: Diagnosis not present

## 2022-02-10 DIAGNOSIS — C7951 Secondary malignant neoplasm of bone: Secondary | ICD-10-CM | POA: Diagnosis not present

## 2022-02-11 ENCOUNTER — Ambulatory Visit: Payer: Medicare Other

## 2022-02-11 DIAGNOSIS — Z992 Dependence on renal dialysis: Secondary | ICD-10-CM | POA: Diagnosis not present

## 2022-02-11 DIAGNOSIS — R197 Diarrhea, unspecified: Secondary | ICD-10-CM | POA: Diagnosis not present

## 2022-02-11 DIAGNOSIS — R52 Pain, unspecified: Secondary | ICD-10-CM | POA: Diagnosis not present

## 2022-02-11 DIAGNOSIS — T7840XA Allergy, unspecified, initial encounter: Secondary | ICD-10-CM | POA: Diagnosis not present

## 2022-02-11 DIAGNOSIS — T8243XA Leakage of vascular dialysis catheter, initial encounter: Secondary | ICD-10-CM | POA: Diagnosis not present

## 2022-02-11 DIAGNOSIS — E8770 Fluid overload, unspecified: Secondary | ICD-10-CM | POA: Diagnosis not present

## 2022-02-11 DIAGNOSIS — N186 End stage renal disease: Secondary | ICD-10-CM | POA: Diagnosis not present

## 2022-02-11 DIAGNOSIS — D689 Coagulation defect, unspecified: Secondary | ICD-10-CM | POA: Diagnosis not present

## 2022-02-11 DIAGNOSIS — N2581 Secondary hyperparathyroidism of renal origin: Secondary | ICD-10-CM | POA: Diagnosis not present

## 2022-02-11 DIAGNOSIS — D631 Anemia in chronic kidney disease: Secondary | ICD-10-CM | POA: Diagnosis not present

## 2022-02-11 DIAGNOSIS — D509 Iron deficiency anemia, unspecified: Secondary | ICD-10-CM | POA: Diagnosis not present

## 2022-02-14 DIAGNOSIS — D509 Iron deficiency anemia, unspecified: Secondary | ICD-10-CM | POA: Diagnosis not present

## 2022-02-14 DIAGNOSIS — R197 Diarrhea, unspecified: Secondary | ICD-10-CM | POA: Diagnosis not present

## 2022-02-14 DIAGNOSIS — T7840XA Allergy, unspecified, initial encounter: Secondary | ICD-10-CM | POA: Diagnosis not present

## 2022-02-14 DIAGNOSIS — N186 End stage renal disease: Secondary | ICD-10-CM | POA: Diagnosis not present

## 2022-02-14 DIAGNOSIS — D689 Coagulation defect, unspecified: Secondary | ICD-10-CM | POA: Diagnosis not present

## 2022-02-14 DIAGNOSIS — N2581 Secondary hyperparathyroidism of renal origin: Secondary | ICD-10-CM | POA: Diagnosis not present

## 2022-02-14 DIAGNOSIS — R52 Pain, unspecified: Secondary | ICD-10-CM | POA: Diagnosis not present

## 2022-02-14 DIAGNOSIS — T8243XA Leakage of vascular dialysis catheter, initial encounter: Secondary | ICD-10-CM | POA: Diagnosis not present

## 2022-02-14 DIAGNOSIS — E8770 Fluid overload, unspecified: Secondary | ICD-10-CM | POA: Diagnosis not present

## 2022-02-14 DIAGNOSIS — Z992 Dependence on renal dialysis: Secondary | ICD-10-CM | POA: Diagnosis not present

## 2022-02-14 DIAGNOSIS — D631 Anemia in chronic kidney disease: Secondary | ICD-10-CM | POA: Diagnosis not present

## 2022-02-16 DIAGNOSIS — T7840XA Allergy, unspecified, initial encounter: Secondary | ICD-10-CM | POA: Diagnosis not present

## 2022-02-16 DIAGNOSIS — T8243XA Leakage of vascular dialysis catheter, initial encounter: Secondary | ICD-10-CM | POA: Diagnosis not present

## 2022-02-16 DIAGNOSIS — D689 Coagulation defect, unspecified: Secondary | ICD-10-CM | POA: Diagnosis not present

## 2022-02-16 DIAGNOSIS — E8770 Fluid overload, unspecified: Secondary | ICD-10-CM | POA: Diagnosis not present

## 2022-02-16 DIAGNOSIS — Z992 Dependence on renal dialysis: Secondary | ICD-10-CM | POA: Diagnosis not present

## 2022-02-16 DIAGNOSIS — D631 Anemia in chronic kidney disease: Secondary | ICD-10-CM | POA: Diagnosis not present

## 2022-02-16 DIAGNOSIS — N186 End stage renal disease: Secondary | ICD-10-CM | POA: Diagnosis not present

## 2022-02-16 DIAGNOSIS — N2581 Secondary hyperparathyroidism of renal origin: Secondary | ICD-10-CM | POA: Diagnosis not present

## 2022-02-16 DIAGNOSIS — R197 Diarrhea, unspecified: Secondary | ICD-10-CM | POA: Diagnosis not present

## 2022-02-16 DIAGNOSIS — D509 Iron deficiency anemia, unspecified: Secondary | ICD-10-CM | POA: Diagnosis not present

## 2022-02-16 DIAGNOSIS — R52 Pain, unspecified: Secondary | ICD-10-CM | POA: Diagnosis not present

## 2022-02-18 DIAGNOSIS — D509 Iron deficiency anemia, unspecified: Secondary | ICD-10-CM | POA: Diagnosis not present

## 2022-02-18 DIAGNOSIS — E8770 Fluid overload, unspecified: Secondary | ICD-10-CM | POA: Diagnosis not present

## 2022-02-18 DIAGNOSIS — D689 Coagulation defect, unspecified: Secondary | ICD-10-CM | POA: Diagnosis not present

## 2022-02-18 DIAGNOSIS — R52 Pain, unspecified: Secondary | ICD-10-CM | POA: Diagnosis not present

## 2022-02-18 DIAGNOSIS — T7840XA Allergy, unspecified, initial encounter: Secondary | ICD-10-CM | POA: Diagnosis not present

## 2022-02-18 DIAGNOSIS — D631 Anemia in chronic kidney disease: Secondary | ICD-10-CM | POA: Diagnosis not present

## 2022-02-18 DIAGNOSIS — N186 End stage renal disease: Secondary | ICD-10-CM | POA: Diagnosis not present

## 2022-02-18 DIAGNOSIS — N2581 Secondary hyperparathyroidism of renal origin: Secondary | ICD-10-CM | POA: Diagnosis not present

## 2022-02-18 DIAGNOSIS — T8243XA Leakage of vascular dialysis catheter, initial encounter: Secondary | ICD-10-CM | POA: Diagnosis not present

## 2022-02-18 DIAGNOSIS — R197 Diarrhea, unspecified: Secondary | ICD-10-CM | POA: Diagnosis not present

## 2022-02-18 DIAGNOSIS — Z992 Dependence on renal dialysis: Secondary | ICD-10-CM | POA: Diagnosis not present

## 2022-02-21 DIAGNOSIS — R52 Pain, unspecified: Secondary | ICD-10-CM | POA: Diagnosis not present

## 2022-02-21 DIAGNOSIS — E8770 Fluid overload, unspecified: Secondary | ICD-10-CM | POA: Diagnosis not present

## 2022-02-21 DIAGNOSIS — N2581 Secondary hyperparathyroidism of renal origin: Secondary | ICD-10-CM | POA: Diagnosis not present

## 2022-02-21 DIAGNOSIS — Z992 Dependence on renal dialysis: Secondary | ICD-10-CM | POA: Diagnosis not present

## 2022-02-21 DIAGNOSIS — D689 Coagulation defect, unspecified: Secondary | ICD-10-CM | POA: Diagnosis not present

## 2022-02-21 DIAGNOSIS — N186 End stage renal disease: Secondary | ICD-10-CM | POA: Diagnosis not present

## 2022-02-21 DIAGNOSIS — R197 Diarrhea, unspecified: Secondary | ICD-10-CM | POA: Diagnosis not present

## 2022-02-21 DIAGNOSIS — T7840XA Allergy, unspecified, initial encounter: Secondary | ICD-10-CM | POA: Diagnosis not present

## 2022-02-21 DIAGNOSIS — D509 Iron deficiency anemia, unspecified: Secondary | ICD-10-CM | POA: Diagnosis not present

## 2022-02-21 DIAGNOSIS — D631 Anemia in chronic kidney disease: Secondary | ICD-10-CM | POA: Diagnosis not present

## 2022-02-21 DIAGNOSIS — T8243XA Leakage of vascular dialysis catheter, initial encounter: Secondary | ICD-10-CM | POA: Diagnosis not present

## 2022-02-22 DIAGNOSIS — R197 Diarrhea, unspecified: Secondary | ICD-10-CM | POA: Diagnosis not present

## 2022-02-22 DIAGNOSIS — N2581 Secondary hyperparathyroidism of renal origin: Secondary | ICD-10-CM | POA: Diagnosis not present

## 2022-02-22 DIAGNOSIS — T8243XA Leakage of vascular dialysis catheter, initial encounter: Secondary | ICD-10-CM | POA: Diagnosis not present

## 2022-02-22 DIAGNOSIS — D631 Anemia in chronic kidney disease: Secondary | ICD-10-CM | POA: Diagnosis not present

## 2022-02-22 DIAGNOSIS — R52 Pain, unspecified: Secondary | ICD-10-CM | POA: Diagnosis not present

## 2022-02-22 DIAGNOSIS — N186 End stage renal disease: Secondary | ICD-10-CM | POA: Diagnosis not present

## 2022-02-22 DIAGNOSIS — Z992 Dependence on renal dialysis: Secondary | ICD-10-CM | POA: Diagnosis not present

## 2022-02-22 DIAGNOSIS — T7840XA Allergy, unspecified, initial encounter: Secondary | ICD-10-CM | POA: Diagnosis not present

## 2022-02-22 DIAGNOSIS — D689 Coagulation defect, unspecified: Secondary | ICD-10-CM | POA: Diagnosis not present

## 2022-02-22 DIAGNOSIS — E8770 Fluid overload, unspecified: Secondary | ICD-10-CM | POA: Diagnosis not present

## 2022-02-22 DIAGNOSIS — D509 Iron deficiency anemia, unspecified: Secondary | ICD-10-CM | POA: Diagnosis not present

## 2022-02-23 ENCOUNTER — Non-Acute Institutional Stay (HOSPITAL_COMMUNITY)
Admission: RE | Admit: 2022-02-23 | Discharge: 2022-02-23 | Disposition: A | Discharge status: Home / Self Care | Payer: Medicare Other | Source: Ambulatory Visit | Attending: Internal Medicine | Admitting: Internal Medicine

## 2022-02-23 VITALS — BP 155/80 | HR 60 | Temp 98.2°F | Resp 16

## 2022-02-23 DIAGNOSIS — D631 Anemia in chronic kidney disease: Secondary | ICD-10-CM | POA: Insufficient documentation

## 2022-02-23 DIAGNOSIS — N186 End stage renal disease: Secondary | ICD-10-CM | POA: Insufficient documentation

## 2022-02-23 LAB — PREPARE RBC (CROSSMATCH)

## 2022-02-23 MED ORDER — SODIUM CHLORIDE 0.9% IV SOLUTION: Freq: Once | INTRAVENOUS | Status: AC

## 2022-02-23 MED ORDER — ACETAMINOPHEN 325 MG PO TABS
650.0000 mg | ORAL_TABLET | Freq: Once | ORAL | Status: AC
  Administered 2022-02-23: 650 mg via ORAL
  Filled 2022-02-23: qty 2

## 2022-02-23 MED ORDER — DIPHENHYDRAMINE HCL 25 MG PO CAPS
25.0000 mg | ORAL_CAPSULE | Freq: Once | ORAL | Status: AC
  Administered 2022-02-23: 25 mg via ORAL
  Filled 2022-02-23: qty 1

## 2022-02-23 NOTE — Progress Notes (Signed)
PATIENT CARE CENTER NOTE:  Provider:  Madelon Lips MD  Diagnosis: Anemia, ESRD  Procedure: 1 unit PRBC   Patient received via PIV, 1 unit of packed red blood cells. Consent for blood products obtained from pt. Type and screen was done before transfusion. Pre transfusion medications were given per orders - PO Tylenol and Benadryl. Post transfusion H/H not required per orders. Tolerated well, vitals stable, discharge instructions given, verbalized understanding. Patient alert, oriented and ambulatory at the time of discharge.

## 2022-02-24 DIAGNOSIS — N186 End stage renal disease: Secondary | ICD-10-CM | POA: Diagnosis not present

## 2022-02-24 DIAGNOSIS — R197 Diarrhea, unspecified: Secondary | ICD-10-CM | POA: Diagnosis not present

## 2022-02-24 DIAGNOSIS — N2581 Secondary hyperparathyroidism of renal origin: Secondary | ICD-10-CM | POA: Diagnosis not present

## 2022-02-24 DIAGNOSIS — D631 Anemia in chronic kidney disease: Secondary | ICD-10-CM | POA: Diagnosis not present

## 2022-02-24 DIAGNOSIS — D509 Iron deficiency anemia, unspecified: Secondary | ICD-10-CM | POA: Diagnosis not present

## 2022-02-24 DIAGNOSIS — R52 Pain, unspecified: Secondary | ICD-10-CM | POA: Diagnosis not present

## 2022-02-24 DIAGNOSIS — T7840XA Allergy, unspecified, initial encounter: Secondary | ICD-10-CM | POA: Diagnosis not present

## 2022-02-24 DIAGNOSIS — T8243XA Leakage of vascular dialysis catheter, initial encounter: Secondary | ICD-10-CM | POA: Diagnosis not present

## 2022-02-24 DIAGNOSIS — D689 Coagulation defect, unspecified: Secondary | ICD-10-CM | POA: Diagnosis not present

## 2022-02-24 DIAGNOSIS — E8770 Fluid overload, unspecified: Secondary | ICD-10-CM | POA: Diagnosis not present

## 2022-02-24 DIAGNOSIS — Z992 Dependence on renal dialysis: Secondary | ICD-10-CM | POA: Diagnosis not present

## 2022-02-24 LAB — TYPE AND SCREEN
ABO/RH(D): B POS
Antibody Screen: NEGATIVE
Unit division: 0

## 2022-02-24 LAB — BPAM RBC
Blood Product Expiration Date: 202308312359
ISSUE DATE / TIME: 202308231047
Unit Type and Rh: 1700

## 2022-02-25 DIAGNOSIS — D631 Anemia in chronic kidney disease: Secondary | ICD-10-CM | POA: Diagnosis not present

## 2022-02-25 DIAGNOSIS — N186 End stage renal disease: Secondary | ICD-10-CM | POA: Diagnosis not present

## 2022-02-25 DIAGNOSIS — D689 Coagulation defect, unspecified: Secondary | ICD-10-CM | POA: Diagnosis not present

## 2022-02-25 DIAGNOSIS — D509 Iron deficiency anemia, unspecified: Secondary | ICD-10-CM | POA: Diagnosis not present

## 2022-02-25 DIAGNOSIS — R197 Diarrhea, unspecified: Secondary | ICD-10-CM | POA: Diagnosis not present

## 2022-02-25 DIAGNOSIS — N2581 Secondary hyperparathyroidism of renal origin: Secondary | ICD-10-CM | POA: Diagnosis not present

## 2022-02-25 DIAGNOSIS — E8770 Fluid overload, unspecified: Secondary | ICD-10-CM | POA: Diagnosis not present

## 2022-02-25 DIAGNOSIS — T8243XA Leakage of vascular dialysis catheter, initial encounter: Secondary | ICD-10-CM | POA: Diagnosis not present

## 2022-02-25 DIAGNOSIS — R52 Pain, unspecified: Secondary | ICD-10-CM | POA: Diagnosis not present

## 2022-02-25 DIAGNOSIS — Z992 Dependence on renal dialysis: Secondary | ICD-10-CM | POA: Diagnosis not present

## 2022-02-25 DIAGNOSIS — T7840XA Allergy, unspecified, initial encounter: Secondary | ICD-10-CM | POA: Diagnosis not present

## 2022-02-28 ENCOUNTER — Ambulatory Visit: Payer: Medicare Other | Admitting: Medical

## 2022-02-28 ENCOUNTER — Ambulatory Visit: Payer: Medicare Other | Admitting: Family Medicine

## 2022-02-28 DIAGNOSIS — T7840XA Allergy, unspecified, initial encounter: Secondary | ICD-10-CM | POA: Diagnosis not present

## 2022-02-28 DIAGNOSIS — R52 Pain, unspecified: Secondary | ICD-10-CM | POA: Diagnosis not present

## 2022-02-28 DIAGNOSIS — N186 End stage renal disease: Secondary | ICD-10-CM | POA: Diagnosis not present

## 2022-02-28 DIAGNOSIS — N2581 Secondary hyperparathyroidism of renal origin: Secondary | ICD-10-CM | POA: Diagnosis not present

## 2022-02-28 DIAGNOSIS — D689 Coagulation defect, unspecified: Secondary | ICD-10-CM | POA: Diagnosis not present

## 2022-02-28 DIAGNOSIS — Z992 Dependence on renal dialysis: Secondary | ICD-10-CM | POA: Diagnosis not present

## 2022-02-28 DIAGNOSIS — E8770 Fluid overload, unspecified: Secondary | ICD-10-CM | POA: Diagnosis not present

## 2022-02-28 DIAGNOSIS — D509 Iron deficiency anemia, unspecified: Secondary | ICD-10-CM | POA: Diagnosis not present

## 2022-02-28 DIAGNOSIS — T8243XA Leakage of vascular dialysis catheter, initial encounter: Secondary | ICD-10-CM | POA: Diagnosis not present

## 2022-02-28 DIAGNOSIS — R197 Diarrhea, unspecified: Secondary | ICD-10-CM | POA: Diagnosis not present

## 2022-02-28 DIAGNOSIS — D631 Anemia in chronic kidney disease: Secondary | ICD-10-CM | POA: Diagnosis not present

## 2022-02-28 NOTE — Progress Notes (Signed)
Office Visit    Patient Name: Devon Cook Date of Encounter: 02/28/2022  PCP:  [name], MD   [place]  Cardiologist:  Freada Bergeron, MD  Advanced Practice Provider:  No care team member to display Electrophysiologist:  None   HPI    Devon Cook is a 61 y.o. male with past medical history significant for ESRD on HD, metastatic prostate cancer, hypertension, hyperlipidemia, OSA on CPAP, A-fib who was referred for preop evaluation last time he was seen in the clinic 04/08/2020 presents today for evaluation of tachycardia.  When he was last seen in the office, he was doing well and able to walk his dogs without issues.  He was in the ED 10/2019 with palpitations that developed at HD.  Thought to have SVT versus a flutter.  Strips look more like a flutter when Dr. Johney Frame reviewed.  CHA2DS2-VASc was 1 at the time.  Was continued on ASA.  He was planning to have ureteral stent placement with urology and needed cardiac clearance at that time.    Past Medical History    Past Medical History:  Diagnosis Date   Anemia, chronic renal failure    followed by dr Hollie Salk--- dx 05/ 2019---  treated with Iron infusions and procrit   Bilateral hydronephrosis urologist-- dr Tresa Moore   malignant-- chronic;  treated with ureteral stents   Chronic gastritis    07-17-2019 and duodenitis   ED (erectile dysfunction)    Edema of both lower extremities    occ le edema wears compresssion hose daily   End stage renal failure on dialysis [facility])    nephrologist-- dr Hollie Salk (@ [facility])-- secondary to obstructive uropathy secon  dary to prostate cancer hemodialysis mon/wed/fri @ [facility]---started first week of Nov 2020   GERD (gastroesophageal reflux disease)    Gout 2019   Herpes genitalis in men    History of adenomatous polyp of colon    History of COVID-19 07/2020   History of hepatitis C 2017   dx 11/ 207-- treated with Harvoni (in  epic 04/ 2019 lab result non-detectable)   History of lower GI bleeding 11/2017   Hypertension    followed by pcp   OSA (obstructive sleep apnea)    severe osa w/ nocturnal hyoxemia per study 06-19-2019,  pt given cpap ( however on 08-30-2019 per pt having issues with mask fitting and on a new one so he not using the cpap);   (03-20-2020 still not using cpap, no mask)   Palpitations    isolated event at ED 10-05-2019 Aflutter/ Afib versus SVT ;   refer to pt's cardiologist note w/ Dr Johney Frame   Prostate 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 [facility]) urologist-- dr Tresa Moore   dx 06/ 2019-- advanced w/ mets to bone and pelvic lymphadeopathy   S/P arteriovenous (AV) fistula creation followed by dr Carlis Abbott (vascular)   03-15-2019  by dr Carlis Abbott, left branhiocephallic   Secondary hyperparathyroidism of renal origin [facility])    Wears glasses    Past Surgical History:  Procedure Laterality Date   A/V FISTULAGRAM Left 09/04/2020   Procedure: A/V FISTULAGRAM;  Surgeon: Angelia Mould, MD;  Location: [place] [facility];  Service: Cardiovascular;  Laterality: Left;   AV FISTULA PLACEMENT Left 03/15/2019   Procedure: ARTERIOVENOUS (AV) FISTULA CREATION LEFT ARM;  Surgeon: Marty Heck, MD;  Location: [place];  Service: Vascular;  Laterality: Left;   COLONOSCOPY  CYSTOSCOPY W/ URETERAL STENT PLACEMENT Bilateral 12/08/2017   Procedure: CYSTOSCOPY WITH RETROGRADE PYELOGRAM/URETERAL STENT PLACEMENT;  Surgeon: Alexis Frock, MD;  [facility];  Service: Urology;  Laterality: Bilateral;   CYSTOSCOPY W/ URETERAL STENT PLACEMENT Bilateral 04/20/2018   Procedure: CYSTOSCOPY WITH STENT REPLACEMENT;  Surgeon: Alexis Frock, MD;  Location: [facility];  Service: Urology;  Laterality: Bilateral;   CYSTOSCOPY W/ URETERAL STENT PLACEMENT Bilateral 08/10/2018   Procedure: CYSTOSCOPY WITH STENT REPLACEMENT, BILATERAL RETROGRADES;  Surgeon: Alexis Frock, MD;  Location: [facility];   Service: Urology;  Laterality: Bilateral;  45 MINS   CYSTOSCOPY W/ URETERAL STENT PLACEMENT Bilateral 01/18/2019   Procedure: CYSTOSCOPY WITH RETROGRADE PYELOGRAM/URETERAL STENT PLACEMENT;  Surgeon: Alexis Frock, MD;  Location: [facility];  Service: Urology;  Laterality: Bilateral;   CYSTOSCOPY W/ URETERAL STENT PLACEMENT Bilateral 05/24/2019   Procedure: CYSTOSCOPY WITH RETROGRADE PYELOGRAM/BILATERAL URETERAL STENT EXCHANGE;  Surgeon: Alexis Frock, MD;  Location: [facility];  Service: Urology;  Laterality: Bilateral;   CYSTOSCOPY W/ URETERAL STENT PLACEMENT Bilateral 09/06/2019   Procedure: CYSTOSCOPY WITH RETROGRADE PYELOGRAM/URETERAL STENT EXCHANGE;  Surgeon: Alexis Frock, MD;  Location: [facility];  Service: Urology;  Laterality: Bilateral;  45 MINS   CYSTOSCOPY W/ URETERAL STENT PLACEMENT Bilateral 05/08/2020   Procedure: CYSTOSCOPY WITH RETROGRADE PYELOGRAM/URETERAL STENT REPLACEMENT;  Surgeon: Alexis Frock, MD;  Location: [facility];  Service: Urology;  Laterality: Bilateral;   CYSTOSCOPY W/ URETERAL STENT PLACEMENT Bilateral 06/04/2021   Procedure: CYSTOSCOPY WITH RETROGRADE PYELOGRAM/URETERAL STENT EXCHANGE;  Surgeon: Alexis Frock, MD;  Location: [facility];  Service: Urology;  Laterality: Bilateral;  45 MINS   INSERTION OF DIALYSIS CATHETER Right 05/06/2019   Procedure: INSERTION OF TUNNELED  DIALYSIS CATHETER;  Surgeon: Rosetta Posner, MD;  Location: [place];  Service: Vascular;  Laterality: Right;   IR NEPHROSTOMY PLACEMENT LEFT  11/15/2017   IR NEPHROSTOMY PLACEMENT RIGHT  11/15/2017   PERIPHERAL VASCULAR INTERVENTION Left 09/04/2020   Procedure: PERIPHERAL VASCULAR INTERVENTION;  Surgeon: Angelia Mould, MD;  Location: [place] [facility];  Service: Cardiovascular;  Laterality: Left;   PROSTATE BIOPSY N/A 12/08/2017   Procedure: BIOPSY TRANSRECTAL ULTRASONIC PROSTATE (TUBP), PERINEAL BIOPSY;   Surgeon: Alexis Frock, MD;  [facility];  Service: Urology;  Laterality: N/A;   TRANSURETHRAL RESECTION OF BLADDER TUMOR N/A 12/08/2017   Procedure: TRANSURETHRAL RESECTION OF BLADDER TUMOR (TURBT);  Surgeon: Alexis Frock, MD;  [facility];  Service: Urology;  Laterality: N/A;   TRICEPS TENDON REPAIR Bilateral 08/2005   UPPER GASTROINTESTINAL ENDOSCOPY      Allergies  No Known Allergies  EKGs/Labs/Other Studies Reviewed:   The following studies were reviewed today: Echocardiogram 04/27/2020  IMPRESSIONS     1. Left ventricular ejection fraction, by estimation, is 45 to 50%. The  left ventricle has mildly decreased function. The left ventricle  demonstrates global hypokinesis. The left ventricular internal cavity size  was moderately dilated. There is mild  concentric left ventricular hypertrophy. Left ventricular diastolic  parameters are consistent with Grade I diastolic dysfunction (impaired  relaxation). Elevated left atrial pressure.   2. Right ventricular systolic function is normal. The right ventricular  size is normal. There is moderately elevated pulmonary artery systolic  pressure. The estimated right ventricular systolic pressure is 50.0 mmHg.   3. Left atrial size was moderately dilated.   4. Right atrial size was mildly dilated.   5. The mitral valve is normal in structure. Mild mitral valve  regurgitation. No evidence of mitral stenosis.   6. The aortic valve is normal in structure. Aortic valve regurgitation is  not visualized. No aortic stenosis is present.   7. Aortic dilatation noted. There is mild dilatation of the ascending  aorta, measuring 42 mm.   8. The inferior vena cava is dilated in size with <50% respiratory  variability, suggesting right atrial pressure of 15 mmHg.   FINDINGS   Left Ventricle: Left ventricular ejection fraction, by estimation, is 45  to 50%. The left ventricle has mildly decreased function. The left  ventricle  demonstrates global hypokinesis. The left ventricular internal  cavity size was moderately dilated.  There is mild concentric left ventricular hypertrophy. Left ventricular  diastolic parameters are consistent with Grade I diastolic dysfunction  (impaired relaxation). Elevated left atrial pressure.   EKG:  EKG is  ordered today.  The ekg ordered today demonstrates   Recent Labs: 06/04/2021: Hemoglobin 12.9 08/23/2021: ALT 15; BUN 26; Creatinine, Ser 7.47; Potassium 4.4; Sodium 138  Recent Lipid Panel    Component Value Date/Time   CHOL 98 (L) 11/13/2017 0959   TRIG 72 11/13/2017 0959   HDL 32 (L) 11/13/2017 0959   CHOLHDL 3.1 11/13/2017 0959   CHOLHDL 2.8 09/07/2015 0001   VLDL 19 09/07/2015 0001   LDLCALC 52 11/13/2017 0959    Home Medications   No outpatient medications have been marked as taking for the 03/01/22 encounter (Appointment) with Elgie Collard, PA-C.     Review of Systems      All other systems reviewed and are otherwise negative except as noted above.  Physical Exam    VS:  There were no vitals taken for this visit. , BMI There is no height or weight on file to calculate BMI.  Wt Readings from Last 3 Encounters:  11/04/21 290 lb (131.5 kg)  09/23/21 290 lb 9.6 oz (131.8 kg)  08/23/21 283 lb 12.8 oz (128.7 kg)       Assessment & Plan    Tachycardia History of suspected atrial flutter Hypertension ESRD on HD Metastatic prostate cancer    Disposition: Follow up  with Freada Bergeron, MD or APP. Signed, Elgie Collard, PA-C 02/28/2022, 9:47 PM [facility] This encounter was created in error - please disregard.

## 2022-03-01 ENCOUNTER — Encounter: Payer: Medicare Other | Admitting: Physician Assistant

## 2022-03-01 DIAGNOSIS — I4892 Unspecified atrial flutter: Secondary | ICD-10-CM

## 2022-03-01 DIAGNOSIS — C61 Malignant neoplasm of prostate: Secondary | ICD-10-CM

## 2022-03-01 DIAGNOSIS — R002 Palpitations: Secondary | ICD-10-CM

## 2022-03-01 DIAGNOSIS — N186 End stage renal disease: Secondary | ICD-10-CM

## 2022-03-01 DIAGNOSIS — I10 Essential (primary) hypertension: Secondary | ICD-10-CM

## 2022-03-02 DIAGNOSIS — Z992 Dependence on renal dialysis: Secondary | ICD-10-CM | POA: Diagnosis not present

## 2022-03-02 DIAGNOSIS — D631 Anemia in chronic kidney disease: Secondary | ICD-10-CM | POA: Diagnosis not present

## 2022-03-02 DIAGNOSIS — D509 Iron deficiency anemia, unspecified: Secondary | ICD-10-CM | POA: Diagnosis not present

## 2022-03-02 DIAGNOSIS — E8770 Fluid overload, unspecified: Secondary | ICD-10-CM | POA: Diagnosis not present

## 2022-03-02 DIAGNOSIS — N186 End stage renal disease: Secondary | ICD-10-CM | POA: Diagnosis not present

## 2022-03-02 DIAGNOSIS — D689 Coagulation defect, unspecified: Secondary | ICD-10-CM | POA: Diagnosis not present

## 2022-03-02 DIAGNOSIS — R197 Diarrhea, unspecified: Secondary | ICD-10-CM | POA: Diagnosis not present

## 2022-03-02 DIAGNOSIS — T8243XA Leakage of vascular dialysis catheter, initial encounter: Secondary | ICD-10-CM | POA: Diagnosis not present

## 2022-03-02 DIAGNOSIS — T7840XA Allergy, unspecified, initial encounter: Secondary | ICD-10-CM | POA: Diagnosis not present

## 2022-03-02 DIAGNOSIS — N2581 Secondary hyperparathyroidism of renal origin: Secondary | ICD-10-CM | POA: Diagnosis not present

## 2022-03-02 DIAGNOSIS — R52 Pain, unspecified: Secondary | ICD-10-CM | POA: Diagnosis not present

## 2022-03-03 ENCOUNTER — Telehealth: Payer: Self-pay | Admitting: Family Medicine

## 2022-03-03 DIAGNOSIS — Z992 Dependence on renal dialysis: Secondary | ICD-10-CM | POA: Diagnosis not present

## 2022-03-03 DIAGNOSIS — N138 Other obstructive and reflux uropathy: Secondary | ICD-10-CM | POA: Diagnosis not present

## 2022-03-03 DIAGNOSIS — N186 End stage renal disease: Secondary | ICD-10-CM | POA: Diagnosis not present

## 2022-03-03 NOTE — Telephone Encounter (Signed)
Tried calling patient to schedule Medicare Annual Wellness Visit (AWV) either virtually or in office.  No answer      awvi 02/01/22 per [place] please schedule at anytime with health coach  This should be a 45 minute visit.

## 2022-03-04 DIAGNOSIS — N2581 Secondary hyperparathyroidism of renal origin: Secondary | ICD-10-CM | POA: Diagnosis not present

## 2022-03-04 DIAGNOSIS — Z992 Dependence on renal dialysis: Secondary | ICD-10-CM | POA: Diagnosis not present

## 2022-03-04 DIAGNOSIS — T7840XA Allergy, unspecified, initial encounter: Secondary | ICD-10-CM | POA: Diagnosis not present

## 2022-03-04 DIAGNOSIS — D509 Iron deficiency anemia, unspecified: Secondary | ICD-10-CM | POA: Diagnosis not present

## 2022-03-04 DIAGNOSIS — N186 End stage renal disease: Secondary | ICD-10-CM | POA: Diagnosis not present

## 2022-03-04 DIAGNOSIS — D689 Coagulation defect, unspecified: Secondary | ICD-10-CM | POA: Diagnosis not present

## 2022-03-04 DIAGNOSIS — D631 Anemia in chronic kidney disease: Secondary | ICD-10-CM | POA: Diagnosis not present

## 2022-03-04 DIAGNOSIS — R52 Pain, unspecified: Secondary | ICD-10-CM | POA: Diagnosis not present

## 2022-03-07 DIAGNOSIS — D631 Anemia in chronic kidney disease: Secondary | ICD-10-CM | POA: Diagnosis not present

## 2022-03-07 DIAGNOSIS — T7840XA Allergy, unspecified, initial encounter: Secondary | ICD-10-CM | POA: Diagnosis not present

## 2022-03-07 DIAGNOSIS — D509 Iron deficiency anemia, unspecified: Secondary | ICD-10-CM | POA: Diagnosis not present

## 2022-03-07 DIAGNOSIS — Z992 Dependence on renal dialysis: Secondary | ICD-10-CM | POA: Diagnosis not present

## 2022-03-07 DIAGNOSIS — D689 Coagulation defect, unspecified: Secondary | ICD-10-CM | POA: Diagnosis not present

## 2022-03-07 DIAGNOSIS — N2581 Secondary hyperparathyroidism of renal origin: Secondary | ICD-10-CM | POA: Diagnosis not present

## 2022-03-07 DIAGNOSIS — R52 Pain, unspecified: Secondary | ICD-10-CM | POA: Diagnosis not present

## 2022-03-07 DIAGNOSIS — N186 End stage renal disease: Secondary | ICD-10-CM | POA: Diagnosis not present

## 2022-03-08 ENCOUNTER — Ambulatory Visit: Payer: Medicare Other | Admitting: Family Medicine

## 2022-03-09 ENCOUNTER — Encounter: Payer: Self-pay | Admitting: Internal Medicine

## 2022-03-09 DIAGNOSIS — Z992 Dependence on renal dialysis: Secondary | ICD-10-CM | POA: Diagnosis not present

## 2022-03-09 DIAGNOSIS — N186 End stage renal disease: Secondary | ICD-10-CM | POA: Diagnosis not present

## 2022-03-09 DIAGNOSIS — D631 Anemia in chronic kidney disease: Secondary | ICD-10-CM | POA: Diagnosis not present

## 2022-03-09 DIAGNOSIS — D509 Iron deficiency anemia, unspecified: Secondary | ICD-10-CM | POA: Diagnosis not present

## 2022-03-09 DIAGNOSIS — R52 Pain, unspecified: Secondary | ICD-10-CM | POA: Diagnosis not present

## 2022-03-09 DIAGNOSIS — T7840XA Allergy, unspecified, initial encounter: Secondary | ICD-10-CM | POA: Diagnosis not present

## 2022-03-09 DIAGNOSIS — N2581 Secondary hyperparathyroidism of renal origin: Secondary | ICD-10-CM | POA: Diagnosis not present

## 2022-03-09 DIAGNOSIS — D689 Coagulation defect, unspecified: Secondary | ICD-10-CM | POA: Diagnosis not present

## 2022-03-11 DIAGNOSIS — D631 Anemia in chronic kidney disease: Secondary | ICD-10-CM | POA: Diagnosis not present

## 2022-03-11 DIAGNOSIS — N2581 Secondary hyperparathyroidism of renal origin: Secondary | ICD-10-CM | POA: Diagnosis not present

## 2022-03-11 DIAGNOSIS — Z992 Dependence on renal dialysis: Secondary | ICD-10-CM | POA: Diagnosis not present

## 2022-03-11 DIAGNOSIS — D509 Iron deficiency anemia, unspecified: Secondary | ICD-10-CM | POA: Diagnosis not present

## 2022-03-11 DIAGNOSIS — T7840XA Allergy, unspecified, initial encounter: Secondary | ICD-10-CM | POA: Diagnosis not present

## 2022-03-11 DIAGNOSIS — R52 Pain, unspecified: Secondary | ICD-10-CM | POA: Diagnosis not present

## 2022-03-11 DIAGNOSIS — N186 End stage renal disease: Secondary | ICD-10-CM | POA: Diagnosis not present

## 2022-03-11 DIAGNOSIS — D689 Coagulation defect, unspecified: Secondary | ICD-10-CM | POA: Diagnosis not present

## 2022-03-14 ENCOUNTER — Encounter: Payer: Self-pay | Admitting: Family Medicine

## 2022-03-14 DIAGNOSIS — N2581 Secondary hyperparathyroidism of renal origin: Secondary | ICD-10-CM | POA: Diagnosis not present

## 2022-03-14 DIAGNOSIS — R52 Pain, unspecified: Secondary | ICD-10-CM | POA: Diagnosis not present

## 2022-03-14 DIAGNOSIS — N186 End stage renal disease: Secondary | ICD-10-CM | POA: Diagnosis not present

## 2022-03-14 DIAGNOSIS — T7840XA Allergy, unspecified, initial encounter: Secondary | ICD-10-CM | POA: Diagnosis not present

## 2022-03-14 DIAGNOSIS — D689 Coagulation defect, unspecified: Secondary | ICD-10-CM | POA: Diagnosis not present

## 2022-03-14 DIAGNOSIS — D631 Anemia in chronic kidney disease: Secondary | ICD-10-CM | POA: Diagnosis not present

## 2022-03-14 DIAGNOSIS — Z992 Dependence on renal dialysis: Secondary | ICD-10-CM | POA: Diagnosis not present

## 2022-03-14 DIAGNOSIS — D509 Iron deficiency anemia, unspecified: Secondary | ICD-10-CM | POA: Diagnosis not present

## 2022-03-16 DIAGNOSIS — D689 Coagulation defect, unspecified: Secondary | ICD-10-CM | POA: Diagnosis not present

## 2022-03-16 DIAGNOSIS — D631 Anemia in chronic kidney disease: Secondary | ICD-10-CM | POA: Diagnosis not present

## 2022-03-16 DIAGNOSIS — R52 Pain, unspecified: Secondary | ICD-10-CM | POA: Diagnosis not present

## 2022-03-16 DIAGNOSIS — T7840XA Allergy, unspecified, initial encounter: Secondary | ICD-10-CM | POA: Diagnosis not present

## 2022-03-16 DIAGNOSIS — Z992 Dependence on renal dialysis: Secondary | ICD-10-CM | POA: Diagnosis not present

## 2022-03-16 DIAGNOSIS — N186 End stage renal disease: Secondary | ICD-10-CM | POA: Diagnosis not present

## 2022-03-16 DIAGNOSIS — D509 Iron deficiency anemia, unspecified: Secondary | ICD-10-CM | POA: Diagnosis not present

## 2022-03-16 DIAGNOSIS — N2581 Secondary hyperparathyroidism of renal origin: Secondary | ICD-10-CM | POA: Diagnosis not present

## 2022-03-18 ENCOUNTER — Telehealth: Payer: Self-pay

## 2022-03-18 ENCOUNTER — Ambulatory Visit (INDEPENDENT_AMBULATORY_CARE_PROVIDER_SITE_OTHER): Payer: Medicare Other

## 2022-03-18 VITALS — Ht 74.0 in | Wt 280.0 lb

## 2022-03-18 DIAGNOSIS — D689 Coagulation defect, unspecified: Secondary | ICD-10-CM | POA: Diagnosis not present

## 2022-03-18 DIAGNOSIS — D509 Iron deficiency anemia, unspecified: Secondary | ICD-10-CM | POA: Diagnosis not present

## 2022-03-18 DIAGNOSIS — Z0000 Encounter for general adult medical examination without abnormal findings: Secondary | ICD-10-CM | POA: Diagnosis not present

## 2022-03-18 DIAGNOSIS — Z992 Dependence on renal dialysis: Secondary | ICD-10-CM | POA: Diagnosis not present

## 2022-03-18 DIAGNOSIS — D631 Anemia in chronic kidney disease: Secondary | ICD-10-CM | POA: Diagnosis not present

## 2022-03-18 DIAGNOSIS — R52 Pain, unspecified: Secondary | ICD-10-CM | POA: Diagnosis not present

## 2022-03-18 DIAGNOSIS — T7840XA Allergy, unspecified, initial encounter: Secondary | ICD-10-CM | POA: Diagnosis not present

## 2022-03-18 DIAGNOSIS — N2581 Secondary hyperparathyroidism of renal origin: Secondary | ICD-10-CM | POA: Diagnosis not present

## 2022-03-18 DIAGNOSIS — N186 End stage renal disease: Secondary | ICD-10-CM | POA: Diagnosis not present

## 2022-03-18 NOTE — Patient Instructions (Signed)
Mr. Devon Cook , Thank you for taking time to come for your Medicare Wellness Visit. I appreciate your ongoing commitment to your health goals. Please review the following plan we discussed and let me know if I can assist you in the future.   Screening recommendations/referrals: Colonoscopy: completed 07/17/2019, due 07/16/2022 Recommended yearly ophthalmology/optometry visit for glaucoma screening and checkup Recommended yearly dental visit for hygiene and checkup  Vaccinations: Influenza vaccine: due Pneumococcal vaccine: n/a Tdap vaccine: due Shingles vaccine: discussed   Covid-19:  04/02/2021, 04/02/2020, 10/08/2019, 09/14/2019  Advanced directives: Advance directive discussed with you today. I have provided a copy for you to complete at home and have notarized. Once this is complete please bring a copy in to our office so we can scan it into your chart.  Conditions/risks identified: smoking  Next appointment: Follow up in one year for your annual wellness visit   Preventive Care 40-64 Years, Male Preventive care refers to lifestyle choices and visits with your health care provider that can promote health and wellness. What does preventive care include? A yearly physical exam. This is also called an annual well check. Dental exams once or twice a year. Routine eye exams. Ask your health care provider how often you should have your eyes checked. Personal lifestyle choices, including: Daily care of your teeth and gums. Regular physical activity. Eating a healthy diet. Avoiding tobacco and drug use. Limiting alcohol use. Practicing safe sex. Taking low-dose aspirin every day starting at age 93. What happens during an annual well check? The services and screenings done by your health care provider during your annual well check will depend on your age, overall health, lifestyle risk factors, and family history of disease. Counseling  Your health care provider may ask you questions about  your: Alcohol use. Tobacco use. Drug use. Emotional well-being. Home and relationship well-being. Sexual activity. Eating habits. Work and work Statistician. Screening  You may have the following tests or measurements: Height, weight, and BMI. Blood pressure. Lipid and cholesterol levels. These may be checked every 5 years, or more frequently if you are over 47 years old. Skin check. Lung cancer screening. You may have this screening every year starting at age 53 if you have a 30-pack-year history of smoking and currently smoke or have quit within the past 15 years. Fecal occult blood test (FOBT) of the stool. You may have this test every year starting at age 33. Flexible sigmoidoscopy or colonoscopy. You may have a sigmoidoscopy every 5 years or a colonoscopy every 10 years starting at age 24. Prostate cancer screening. Recommendations will vary depending on your family history and other risks. Hepatitis C blood test. Hepatitis B blood test. Sexually transmitted disease (STD) testing. Diabetes screening. This is done by checking your blood sugar (glucose) after you have not eaten for a while (fasting). You may have this done every 1-3 years. Discuss your test results, treatment options, and if necessary, the need for more tests with your health care provider. Vaccines  Your health care provider may recommend certain vaccines, such as: Influenza vaccine. This is recommended every year. Tetanus, diphtheria, and acellular pertussis (Tdap, Td) vaccine. You may need a Td booster every 10 years. Zoster vaccine. You may need this after age 81. Pneumococcal 13-valent conjugate (PCV13) vaccine. You may need this if you have certain conditions and have not been vaccinated. Pneumococcal polysaccharide (PPSV23) vaccine. You may need one or two doses if you smoke cigarettes or if you have certain conditions. Talk to your  health care provider about which screenings and vaccines you need and how  often you need them. This information is not intended to replace advice given to you by your health care provider. Make sure you discuss any questions you have with your health care provider. Document Released: 07/17/2015 Document Revised: 03/09/2016 Document Reviewed: 04/21/2015 Elsevier Interactive Patient Education  2017 [employer] Prevention in the Home Falls can cause injuries. They can happen to people of all ages. There are many things you can do to make your home safe and to help prevent falls. What can I do on the outside of my home? Regularly fix the edges of walkways and driveways and fix any cracks. Remove anything that might make you trip as you walk through a door, such as a raised step or threshold. Trim any bushes or trees on the path to your home. Use bright outdoor lighting. Clear any walking paths of anything that might make someone trip, such as rocks or tools. Regularly check to see if handrails are loose or broken. Make sure that both sides of any steps have handrails. Any raised decks and porches should have guardrails on the edges. Have any leaves, snow, or ice cleared regularly. Use sand or salt on walking paths during winter. Clean up any spills in your garage right away. This includes oil or grease spills. What can I do in the bathroom? Use night lights. Install grab bars by the toilet and in the tub and shower. Do not use towel bars as grab bars. Use non-skid mats or decals in the tub or shower. If you need to sit down in the shower, use a plastic, non-slip stool. Keep the floor dry. Clean up any water that spills on the floor as soon as it happens. Remove soap buildup in the tub or shower regularly. Attach bath mats securely with double-sided non-slip rug tape. Do not have throw rugs and other things on the floor that can make you trip. What can I do in the bedroom? Use night lights. Make sure that you have a light by your bed that is easy to  reach. Do not use any sheets or blankets that are too big for your bed. They should not hang down onto the floor. Have a firm chair that has side arms. You can use this for support while you get dressed. Do not have throw rugs and other things on the floor that can make you trip. What can I do in the kitchen? Clean up any spills right away. Avoid walking on wet floors. Keep items that you use a lot in easy-to-reach places. If you need to reach something above you, use a strong step stool that has a grab bar. Keep electrical cords out of the way. Do not use floor polish or wax that makes floors slippery. If you must use wax, use non-skid floor wax. Do not have throw rugs and other things on the floor that can make you trip. What can I do with my stairs? Do not leave any items on the stairs. Make sure that there are handrails on both sides of the stairs and use them. Fix handrails that are broken or loose. Make sure that handrails are as long as the stairways. Check any carpeting to make sure that it is firmly attached to the stairs. Fix any carpet that is loose or worn. Avoid having throw rugs at the top or bottom of the stairs. If you do have throw rugs, attach them  to the floor with carpet tape. Make sure that you have a light switch at the top of the stairs and the bottom of the stairs. If you do not have them, ask someone to add them for you. What else can I do to help prevent falls? Wear shoes that: Do not have high heels. Have rubber bottoms. Are comfortable and fit you well. Are closed at the toe. Do not wear sandals. If you use a stepladder: Make sure that it is fully opened. Do not climb a closed stepladder. Make sure that both sides of the stepladder are locked into place. Ask someone to hold it for you, if possible. Clearly mark and make sure that you can see: Any grab bars or handrails. First and last steps. Where the edge of each step is. Use tools that help you move  around (mobility aids) if they are needed. These include: Canes. Walkers. Scooters. Crutches. Turn on the lights when you go into a dark area. Replace any light bulbs as soon as they burn out. Set up your furniture so you have a clear path. Avoid moving your furniture around. If any of your floors are uneven, fix them. If there are any pets around you, be aware of where they are. Review your medicines with your doctor. Some medicines can make you feel dizzy. This can increase your chance of falling. Ask your doctor what other things that you can do to help prevent falls. This information is not intended to replace advice given to you by your health care provider. Make sure you discuss any questions you have with your health care provider. Document Released: 04/16/2009 Document Revised: 11/26/2015 Document Reviewed: 07/25/2014 Elsevier Interactive Patient Education  2017 [employer].

## 2022-03-18 NOTE — Telephone Encounter (Signed)
   Telephone encounter was:  Unsuccessful.  03/18/2022 Name: RAYMAN PETROSIAN MRN: [id number] DOB: 1960/11/02  Unsuccessful outbound call made today to assist with:  Food Insecurity  Outreach Attempt:  1st Attempt  A HIPAA compliant voice message was left requesting a return call.  Instructed patient to call back at [phone] at their earliest convenience.  [facility]  [place], [place]  Main Phone: [phone]  E-mail: [email or web address]  Website: [email or web address]

## 2022-03-18 NOTE — Addendum Note (Signed)
Addended by: Glenna Durand E on: 03/18/2022 11:28 AM   Modules accepted: Orders

## 2022-03-18 NOTE — Progress Notes (Signed)
I connected with Devon Cook today by telephone and verified that I am speaking with the correct person using two identifiers. Location patient: home Location provider: work Persons participating in the virtual visit: Devon Cook, Devon Cook.   I discussed the limitations, risks, security and privacy concerns of performing an evaluation and management service by telephone and the availability of in person appointments. I also discussed with the patient that there may be a patient responsible charge related to this service. The patient expressed understanding and verbally consented to this telephonic visit.    Interactive audio and video telecommunications were attempted between this provider and patient, however failed, due to patient having technical difficulties OR patient did not have access to video capability.  We continued and completed visit with audio only.     Vital signs may be patient reported or missing.  Subjective:   Devon Cook is a 61 y.o. male who presents for an Initial Medicare Annual Wellness Visit.  Review of Systems     Cardiac Risk Factors include: advanced age (>9mn, >>64women);hypertension;male gender;obesity (BMI >30kg/m2);smoking/ tobacco exposure     Objective:    Today's Vitals   03/18/22 1101  Weight: 280 lb (127 kg)  Height: '6\' 2"'$  (1.88 m)   Body mass index is 35.95 kg/m.     03/18/2022   11:09 AM 06/04/2021    8:02 AM 09/04/2020    7:05 AM 05/08/2020    6:11 AM 10/05/2019   12:13 PM 09/06/2019    6:30 AM 05/24/2019    6:40 AM  Advanced Directives  Does Patient Have a Medical Advance Directive? No No No No No No No  Would patient like information on creating a medical advance directive? Yes (MAU/Ambulatory/Procedural Areas - Information given) No - Patient declined No - Patient declined No - Patient declined No - Patient declined No - Patient declined No - Patient declined    Current Medications (verified) Outpatient Encounter  Medications as of 03/18/2022  Medication Sig   acetaminophen (TYLENOL) 500 MG tablet Take 1,500 mg by mouth every 6 (six) hours as needed for moderate pain.   albuterol (VENTOLIN HFA) 108 (90 Base) MCG/ACT inhaler TAKE 2 PUFFS BY MOUTH EVERY 6 HOURS AS NEEDED FOR WHEEZE OR SHORTNESS OF BREATH (Patient taking differently: Inhale 2 puffs into the lungs every 6 (six) hours as needed for wheezing or shortness of breath.)   allopurinol (ZYLOPRIM) 100 MG tablet TAKE 1 TABLET BY MOUTH ONCE DAILY (Patient taking differently: Take 100 mg by mouth daily.)   ALPRAZolam (XANAX) 0.25 MG tablet TAKE 1 TABLET BY MOUTH DAILY AS NEEDED FOR ANXIETY   amLODipine (NORVASC) 10 MG tablet Take 10 mg by mouth at bedtime.    ascorbic acid (VITAMIN C) 500 MG tablet Take 1,000 mg by mouth daily.   aspirin EC 81 MG tablet Take 81 mg by mouth daily.   cinacalcet (SENSIPAR) 90 MG tablet Take 90 mg by mouth every evening.   diphenhydrAMINE (BENADRYL) 25 MG tablet Take 25 mg by mouth every 6 (six) hours as needed for allergies.   Doxercalciferol (HECTOROL IV) Doxercalciferol (Hectorol)   ferric citrate (AURYXIA) 1 GM 210 MG(Fe) tablet Take 630 mg by mouth 3 (three) times daily with meals.   losartan (COZAAR) 50 MG tablet Take 100 mg by mouth daily.   Methoxy PEG-Epoetin Beta (MIRCERA IJ) Mircera   metoprolol succinate (TOPROL XL) 25 MG 24 hr tablet Take 1 tablet (25 mg total) by mouth daily. Please make overdue  appt with Dr. Johney Frame before anymore refills. Thank you 1st attempt   Multiple Vitamins-Minerals (MULTIVITAMIN WITH MINERALS) tablet Take 1 tablet by mouth daily.   multivitamin (RENA-VIT) TABS tablet Take 1 tablet by mouth daily.   pantoprazole (PROTONIX) 40 MG tablet Take 1 tablet (40 mg total) by mouth 2 (two) times daily.   vitamin B-12 (CYANOCOBALAMIN) 1000 MCG tablet Take 1,000 mcg by mouth daily.   XTANDI 40 MG capsule Take 40 mg by mouth at bedtime.   zinc gluconate 50 MG tablet Take 50 mg by mouth daily.    amoxicillin-clavulanate (AUGMENTIN) 875-125 MG tablet Take 1 tablet by mouth 2 (two) times daily. (Patient not taking: Reported on 09/23/2021)   azithromycin (ZITHROMAX) 500 MG tablet Take 1 tablet (500 mg total) by mouth daily. (Patient not taking: Reported on 08/23/2021)   benzonatate (TESSALON) 100 MG capsule Take 2 capsules (200 mg total) by mouth 3 (three) times daily as needed for cough. (Patient not taking: Reported on 09/23/2021)   lidocaine-prilocaine (EMLA) cream lidocaine-prilocaine 2.5 %-2.5 % topical cream  APPLY SMALL AMOUNT TO ACCESS SITE (AVF) 1 HOUR BEFORE DIALYSIS. COVER WITH OCCLUSIVE DRESSING [facility] WRAP) (Patient not taking: Reported on 08/12/2021)   oxyCODONE-acetaminophen (PERCOCET) 5-325 MG tablet Take 1 tablet by mouth every 6 (six) hours as needed for severe pain or moderate pain (post-operatively). (Patient not taking: Reported on 08/12/2021)   Oxymetazoline HCl (VICKS SINEX NA) Place 1 spray into the nose daily as needed (congestion). (Patient not taking: Reported on 09/23/2021)   No facility-administered encounter medications on file as of 03/18/2022.    Allergies (verified) Patient has no known allergies.   History: Past Medical History:  Diagnosis Date   Anemia, chronic renal failure    followed by dr Hollie Salk--- dx 05/ 2019---  treated with Iron infusions and procrit   Bilateral hydronephrosis urologist-- dr Tresa Moore   malignant-- chronic;  treated with ureteral stents   Chronic gastritis    07-17-2019 and duodenitis   ED (erectile dysfunction)    Edema of both lower extremities    occ le edema wears compresssion hose daily   End stage renal failure on dialysis [facility])    nephrologist-- dr Hollie Salk (@ [facility])-- secondary to obstructive uropathy secon  dary to prostate cancer hemodialysis mon/wed/fri @ [facility]---started first week of Nov 2020   GERD (gastroesophageal reflux disease)    Gout 2019   Herpes genitalis in men    History of adenomatous  polyp of colon    History of COVID-19 07/2020   History of hepatitis C 2017   dx 11/ 207-- treated with Harvoni (in epic 04/ 2019 lab result non-detectable)   History of lower GI bleeding 11/2017   Hypertension    followed by pcp   OSA (obstructive sleep apnea)    severe osa w/ nocturnal hyoxemia per study 06-19-2019,  pt given cpap ( however on 08-30-2019 per pt having issues with mask fitting and on a new one so he not using the cpap);   (03-20-2020 still not using cpap, no mask)   Palpitations    isolated event at ED 10-05-2019 Aflutter/ Afib versus SVT ;   refer to pt's cardiologist note w/ Dr Johney Frame   Prostate 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 [facility]) urologist-- dr Tresa Moore   dx 06/ 2019-- advanced w/ mets to bone and pelvic lymphadeopathy   S/P arteriovenous (AV) fistula creation followed by dr Carlis Abbott (vascular)   03-15-2019  by dr Carlis Abbott, left branhiocephallic  Secondary hyperparathyroidism of renal origin [facility])    Wears glasses    Past Surgical History:  Procedure Laterality Date   A/V FISTULAGRAM Left 09/04/2020   Procedure: A/V FISTULAGRAM;  Surgeon: Angelia Mould, MD;  Location: [place] [facility];  Service: Cardiovascular;  Laterality: Left;   AV FISTULA PLACEMENT Left 03/15/2019   Procedure: ARTERIOVENOUS (AV) FISTULA CREATION LEFT ARM;  Surgeon: Marty Heck, MD;  Location: [place];  Service: Vascular;  Laterality: Left;   COLONOSCOPY     CYSTOSCOPY W/ URETERAL STENT PLACEMENT Bilateral 12/08/2017   Procedure: CYSTOSCOPY WITH RETROGRADE PYELOGRAM/URETERAL STENT PLACEMENT;  Surgeon: Alexis Frock, MD;  [facility];  Service: Urology;  Laterality: Bilateral;   CYSTOSCOPY W/ URETERAL STENT PLACEMENT Bilateral 04/20/2018   Procedure: CYSTOSCOPY WITH STENT REPLACEMENT;  Surgeon: Alexis Frock, MD;  Location: [facility];  Service: Urology;  Laterality: Bilateral;   CYSTOSCOPY W/ URETERAL STENT PLACEMENT Bilateral 08/10/2018   Procedure:  CYSTOSCOPY WITH STENT REPLACEMENT, BILATERAL RETROGRADES;  Surgeon: Alexis Frock, MD;  Location: [facility];  Service: Urology;  Laterality: Bilateral;  45 MINS   CYSTOSCOPY W/ URETERAL STENT PLACEMENT Bilateral 01/18/2019   Procedure: CYSTOSCOPY WITH RETROGRADE PYELOGRAM/URETERAL STENT PLACEMENT;  Surgeon: Alexis Frock, MD;  Location: [facility];  Service: Urology;  Laterality: Bilateral;   CYSTOSCOPY W/ URETERAL STENT PLACEMENT Bilateral 05/24/2019   Procedure: CYSTOSCOPY WITH RETROGRADE PYELOGRAM/BILATERAL URETERAL STENT EXCHANGE;  Surgeon: Alexis Frock, MD;  Location: [facility];  Service: Urology;  Laterality: Bilateral;   CYSTOSCOPY W/ URETERAL STENT PLACEMENT Bilateral 09/06/2019   Procedure: CYSTOSCOPY WITH RETROGRADE PYELOGRAM/URETERAL STENT EXCHANGE;  Surgeon: Alexis Frock, MD;  Location: [facility];  Service: Urology;  Laterality: Bilateral;  45 MINS   CYSTOSCOPY W/ URETERAL STENT PLACEMENT Bilateral 05/08/2020   Procedure: CYSTOSCOPY WITH RETROGRADE PYELOGRAM/URETERAL STENT REPLACEMENT;  Surgeon: Alexis Frock, MD;  Location[facility];  Service: Urology;  Laterality: Bilateral;   CYSTOSCOPY W/ URETERAL STENT PLACEMENT Bilateral 06/04/2021   Procedure: CYSTOSCOPY WITH RETROGRADE PYELOGRAM/URETERAL STENT EXCHANGE;  Surgeon: Alexis Frock, MD;  Location: [facility];  Service: Urology;  Laterality: Bilateral;  45 MINS   INSERTION OF DIALYSIS CATHETER Right 05/06/2019   Procedure: INSERTION OF TUNNELED  DIALYSIS CATHETER;  Surgeon: Rosetta Posner, MD;  Location: [place];  Service: Vascular;  Laterality: Right;   IR NEPHROSTOMY PLACEMENT LEFT  11/15/2017   IR NEPHROSTOMY PLACEMENT RIGHT  11/15/2017   PERIPHERAL VASCULAR INTERVENTION Left 09/04/2020   Procedure: PERIPHERAL VASCULAR INTERVENTION;  Surgeon: Angelia Mould, MD;  Location: [place] [facility];  Service: Cardiovascular;   Laterality: Left;   PROSTATE BIOPSY N/A 12/08/2017   Procedure: BIOPSY TRANSRECTAL ULTRASONIC PROSTATE (TUBP), PERINEAL BIOPSY;  Surgeon: Alexis Frock, MD;  [facility];  Service: Urology;  Laterality: N/A;   TRANSURETHRAL RESECTION OF BLADDER TUMOR N/A 12/08/2017   Procedure: TRANSURETHRAL RESECTION OF BLADDER TUMOR (TURBT);  Surgeon: Alexis Frock, MD;  [facility];  Service: Urology;  Laterality: N/A;   TRICEPS TENDON REPAIR Bilateral 08/2005   UPPER GASTROINTESTINAL ENDOSCOPY     Family History  Problem Relation Age of Onset   Hypertension Mother    Heart disease Mother    Diabetes Sister    Diabetes Brother    Prostate cancer Maternal Grandfather    Diabetes Sister    Prostate cancer Maternal Uncle    Colon polyps Neg Hx    Esophageal cancer Neg Hx    Stomach  cancer Neg Hx    Rectal cancer Neg Hx    Social History   Socioeconomic History   Marital status: Married    Spouse name: Not on file   Number of children: 5   Years of education: Not on file   Highest education level: Not on file  Occupational History   Occupation: retired  Tobacco Use   Smoking status: Some Days    Packs/day: 0.50    Years: 6.00    Total pack years: 3.00    Types: Cigarettes, Pipe, Cigars   Smokeless tobacco: Never   Tobacco comments:      smokes some days cigarettes as of 05-25-2021  Vaping Use   Vaping Use: Never used  Substance and Sexual Activity   Alcohol use: Yes    Comment: 2 drinks per weekend   Drug use: No   Sexual activity: Yes  Other Topics Concern   Not on file  Social History Narrative   Mon/wed Friday hemodialysis at [facility] on [address] [place]   Dr Hollie Salk nephrologist   Left arm fistula   Current uses right ij catheter for dialysis   Still makes some urine   Social Determinants of Health   Financial Resource Strain: Medium Risk (03/18/2022)   Overall Financial Resource Strain (CARDIA)    Difficulty of Paying Living Expenses:  Somewhat hard  Food Insecurity: Food Insecurity Present (03/18/2022)   Hunger Vital Sign    Worried About [place] in the Last Year: Often true    Ran Out of Food in the Last Year: Often true  Transportation Needs: No Transportation Needs (03/18/2022)   PRAPARE - Hydrologist (Medical): No    Lack of Transportation (Non-Medical): No  Physical Activity: Inactive (03/18/2022)   Exercise Vital Sign    Days of Exercise per Week: 0 days    Minutes of Exercise per Session: 0 min  Stress: No Stress Concern Present (03/18/2022)   [place]    Feeling of Stress : Not at all  Social Connections: Not on file    Tobacco Counseling Ready to quit: Not Answered Counseling given: Not Answered Tobacco comments:   smokes some days cigarettes as of 05-25-2021   Clinical Intake:  Pre-visit preparation completed: Yes  Pain : No/denies pain     Nutritional Status: BMI > 30  Obese Nutritional Risks: None Diabetes: No  How often do you need to have someone help you when you read instructions, pamphlets, or other written materials from your doctor or pharmacy?: 1 - Never What is the last grade level you completed in school?: 48yr college  Diabetic? no  Interpreter Needed?: No  Information entered by :: Devon Cook   Activities of Daily Living    03/18/2022   11:12 AM 06/04/2021    8:07 AM  In your present state of health, do you have any difficulty performing the following activities:  Hearing? 0 0  Vision? 0 0  Difficulty concentrating or making decisions? 0 0  Walking or climbing stairs? 0 0  Dressing or bathing? 0 0  Doing errands, shopping? 0   Preparing Food and eating ? N   Using the Toilet? N   In the past six months, have you accidently leaked urine? N   Do you have problems with loss of bowel control? N   Managing your Medications? N   Managing your Finances? N  Housekeeping or managing your Housekeeping? N     Patient Care Team: [name], MD as PCP - General (Family Medicine) Freada Bergeron, MD as PCP - Cardiology ([facility] [place] any recent Medical Services you may have received from other than [facility] providers in the past year (date may be approximate).     Assessment:   This is a routine wellness examination for Devon Cook.  Hearing/Vision screen Vision Screening - Comments:: No regular eye exams, [facility]  Dietary issues and exercise activities discussed: Current Exercise Habits: The patient has a physically strenuous job, but has no regular exercise apart from work.   Goals Addressed             This Visit's Progress    Patient Stated       03/18/2022, wants to get healthier, continue to eat healthier       Depression Screen    03/18/2022   11:11 AM 08/23/2021    3:16 PM 11/18/2015    3:04 PM  PHQ 2/9 Scores  PHQ - 2 Score 0 0 0  PHQ- 9 Score 0      Fall Risk    03/18/2022   11:10 AM 11/18/2015    3:04 PM  [place] in the past year? 0 No  Number falls in past yr: 0   Injury with Fall? 0   Risk for fall due to : Medication side effect   Follow up Falls prevention discussed;Education provided;Falls evaluation completed     FALL RISK PREVENTION PERTAINING TO THE HOME:  Any stairs in or around the home? Yes  If so, are there any without handrails? No  Home free of loose throw rugs in walkways, pet beds, electrical cords, etc? Yes  Adequate lighting in your home to reduce risk of falls? Yes   ASSISTIVE DEVICES UTILIZED TO PREVENT FALLS:  Life alert? No  Use of a cane, walker or w/c? No  Grab bars in the bathroom? No  Shower chair or bench in shower? No  Elevated toilet seat or a handicapped toilet? Yes   TIMED UP AND GO:  Was the test performed? No .      Cognitive Function:        03/18/2022   11:13 AM  6CIT Screen  What Year? 0  points  What month? 0 points  What time? 0 points  Count back from 20 4 points  Months in reverse 0 points  Repeat phrase 0 points  Total Score 4 points    Immunizations Immunization History  Administered Date(s) Administered   DTaP 05/12/2001   Hepatitis B, adult 07/11/2019, 08/10/2019, 12/05/2019   Hepb-cpg 08/05/2020, 09/03/2020, 10/08/2020, 12/03/2020   Influenza,inj,Quad PF,6+ Mos 03/31/2020, 04/02/2021, 04/16/2021   Influenza,inj,Quad PF,6-35 Mos 05/28/2019   PFIZER(Purple Top)SARS-COV-2 Vaccination 09/14/2019, 10/08/2019, 04/02/2020   PPD Test 05/03/1999   Pfizer Covid-19 Vaccine Bivalent Booster 58yr & up 04/02/2021   Pneumococcal Conjugate-13 07/11/2020   Pneumococcal Polysaccharide-23 09/08/2020   Tdap 01/09/2012    TDAP status: Due, Education has been provided regarding the importance of this vaccine. Advised may receive this vaccine at local pharmacy or Health Dept. Aware to provide a copy of the vaccination record if obtained from local pharmacy or Health Dept. Verbalized acceptance and understanding.  Flu Vaccine status: Due, Education has been provided regarding the importance of this vaccine. Advised may receive this vaccine at local pharmacy or Health Dept. Aware to provide a copy of the  vaccination record if obtained from local pharmacy or Health Dept. Verbalized acceptance and understanding.  Pneumococcal vaccine status: Up to date  Covid-19 vaccine status: Completed vaccines  Qualifies for Shingles Vaccine? Yes   Zostavax completed No   Shingrix Completed?: No.    Education has been provided regarding the importance of this vaccine. Patient has been advised to call insurance company to determine out of pocket expense if they have not yet received this vaccine. Advised may also receive vaccine at local pharmacy or Health Dept. Verbalized acceptance and understanding.  Screening Tests Health Maintenance  Topic Date Due   Zoster Vaccines- Shingrix (1 of 2)  Never done   COVID-19 Vaccine (5 - Pfizer risk series) 05/28/2021   TETANUS/TDAP  01/08/2022   INFLUENZA VACCINE  02/01/2022   COLONOSCOPY (Pts 45-57yr Insurance coverage will need to be confirmed)  07/16/2022   Hepatitis C Screening  Completed   HIV Screening  Completed   HPV VACCINES  Aged Out    Health Maintenance  Health Maintenance Due  Topic Date Due   Zoster Vaccines- Shingrix (1 of 2) Never done   COVID-19 Vaccine (5 - Pfizer risk series) 05/28/2021   TETANUS/TDAP  01/08/2022   INFLUENZA VACCINE  02/01/2022    Colorectal cancer screening: Type of screening: Colonoscopy. Completed 07/17/2019. Repeat every 3 years  Lung Cancer Screening: (Low Dose CT Chest recommended if Age 61-80years, 30 pack-year currently smoking OR have quit w/in 15years.) does not qualify.   Lung Cancer Screening Referral: no  Additional Screening:  Hepatitis C Screening: does qualify; Completed 11/13/2017  Vision Screening: Recommended annual ophthalmology exams for early detection of glaucoma and other disorders of the eye. Is the patient up to date with their annual eye exam?  Yes  Who is the provider or what is the name of the office in which the patient attends annual eye exams? [facility] If pt is not established with a provider, would they like to be referred to a provider to establish care? No .   Dental Screening: Recommended annual dental exams for proper oral hygiene  Community Resource Referral / Chronic Care Management: CRR required this visit?  No   CCM required this visit?  No      Plan:     I have personally reviewed and noted the following in the patient's chart:   Medical and social history Use of alcohol, tobacco or illicit drugs  Current medications and supplements including opioid prescriptions. Patient is not currently taking opioid prescriptions. Functional ability and status Nutritional status Physical activity Advanced directives List of other  physicians Hospitalizations, surgeries, and ER visits in previous 12 months Vitals Screenings to include cognitive, depression, and falls Referrals and appointments  In addition, I have reviewed and discussed with patient certain preventive protocols, quality metrics, and best practice recommendations. A written personalized care plan for preventive services as well as general preventive health recommendations were provided to patient.     NKellie Simmering Cook   [id number]  Nurse Notes: none  Due to this being a virtual visit, the after visit summary with patients personalized plan was offered to patient via mail or my-chart.  Patient would like to access on my-chart

## 2022-03-21 DIAGNOSIS — Z992 Dependence on renal dialysis: Secondary | ICD-10-CM | POA: Diagnosis not present

## 2022-03-21 DIAGNOSIS — N2581 Secondary hyperparathyroidism of renal origin: Secondary | ICD-10-CM | POA: Diagnosis not present

## 2022-03-21 DIAGNOSIS — D509 Iron deficiency anemia, unspecified: Secondary | ICD-10-CM | POA: Diagnosis not present

## 2022-03-21 DIAGNOSIS — R52 Pain, unspecified: Secondary | ICD-10-CM | POA: Diagnosis not present

## 2022-03-21 DIAGNOSIS — D689 Coagulation defect, unspecified: Secondary | ICD-10-CM | POA: Diagnosis not present

## 2022-03-21 DIAGNOSIS — D631 Anemia in chronic kidney disease: Secondary | ICD-10-CM | POA: Diagnosis not present

## 2022-03-21 DIAGNOSIS — N186 End stage renal disease: Secondary | ICD-10-CM | POA: Diagnosis not present

## 2022-03-21 DIAGNOSIS — T7840XA Allergy, unspecified, initial encounter: Secondary | ICD-10-CM | POA: Diagnosis not present

## 2022-03-23 ENCOUNTER — Telehealth: Payer: Self-pay

## 2022-03-23 DIAGNOSIS — N186 End stage renal disease: Secondary | ICD-10-CM | POA: Diagnosis not present

## 2022-03-23 DIAGNOSIS — N2581 Secondary hyperparathyroidism of renal origin: Secondary | ICD-10-CM | POA: Diagnosis not present

## 2022-03-23 DIAGNOSIS — R52 Pain, unspecified: Secondary | ICD-10-CM | POA: Diagnosis not present

## 2022-03-23 DIAGNOSIS — D509 Iron deficiency anemia, unspecified: Secondary | ICD-10-CM | POA: Diagnosis not present

## 2022-03-23 DIAGNOSIS — Z992 Dependence on renal dialysis: Secondary | ICD-10-CM | POA: Diagnosis not present

## 2022-03-23 DIAGNOSIS — D631 Anemia in chronic kidney disease: Secondary | ICD-10-CM | POA: Diagnosis not present

## 2022-03-23 DIAGNOSIS — T7840XA Allergy, unspecified, initial encounter: Secondary | ICD-10-CM | POA: Diagnosis not present

## 2022-03-23 DIAGNOSIS — D689 Coagulation defect, unspecified: Secondary | ICD-10-CM | POA: Diagnosis not present

## 2022-03-23 NOTE — Telephone Encounter (Signed)
   Telephone encounter was:  Successful.  03/23/2022 Name: Devon Cook MRN: [id number] DOB: 1960-07-09  Devon Cook is a 61 y.o. year old male who is a primary care patient of [employer], Elyse Jarvis, MD . The community resource team was consulted for assistance with [place] guide performed the following interventions: Patient provided with information about care guide support team and interviewed to confirm resource needs. Patient advise he is only interested in food pantries. CG did offer meals on wheels support and [facility] meals assistance, however patient declined. Patient advised he would like resources sent to his e-mail. CG sent [place] Program information to patient's e-mail on file per request. Patient has been advised if further assistance is needed, to please contact me by phone or e-mail. Patient understood.  Follow Up Plan:  No further follow up planned at this time. The patient has been provided with needed resources.  [place] management  [place], [place]  Main Phone: [phone]  E-mail: [email or web address]  Website: [email or web address]

## 2022-03-25 DIAGNOSIS — D689 Coagulation defect, unspecified: Secondary | ICD-10-CM | POA: Diagnosis not present

## 2022-03-25 DIAGNOSIS — Z992 Dependence on renal dialysis: Secondary | ICD-10-CM | POA: Diagnosis not present

## 2022-03-25 DIAGNOSIS — R52 Pain, unspecified: Secondary | ICD-10-CM | POA: Diagnosis not present

## 2022-03-25 DIAGNOSIS — N186 End stage renal disease: Secondary | ICD-10-CM | POA: Diagnosis not present

## 2022-03-25 DIAGNOSIS — D631 Anemia in chronic kidney disease: Secondary | ICD-10-CM | POA: Diagnosis not present

## 2022-03-25 DIAGNOSIS — N2581 Secondary hyperparathyroidism of renal origin: Secondary | ICD-10-CM | POA: Diagnosis not present

## 2022-03-25 DIAGNOSIS — D509 Iron deficiency anemia, unspecified: Secondary | ICD-10-CM | POA: Diagnosis not present

## 2022-03-25 DIAGNOSIS — T7840XA Allergy, unspecified, initial encounter: Secondary | ICD-10-CM | POA: Diagnosis not present

## 2022-03-28 DIAGNOSIS — N186 End stage renal disease: Secondary | ICD-10-CM | POA: Diagnosis not present

## 2022-03-28 DIAGNOSIS — R52 Pain, unspecified: Secondary | ICD-10-CM | POA: Diagnosis not present

## 2022-03-28 DIAGNOSIS — D631 Anemia in chronic kidney disease: Secondary | ICD-10-CM | POA: Diagnosis not present

## 2022-03-28 DIAGNOSIS — Z992 Dependence on renal dialysis: Secondary | ICD-10-CM | POA: Diagnosis not present

## 2022-03-28 DIAGNOSIS — D689 Coagulation defect, unspecified: Secondary | ICD-10-CM | POA: Diagnosis not present

## 2022-03-28 DIAGNOSIS — T7840XA Allergy, unspecified, initial encounter: Secondary | ICD-10-CM | POA: Diagnosis not present

## 2022-03-28 DIAGNOSIS — N2581 Secondary hyperparathyroidism of renal origin: Secondary | ICD-10-CM | POA: Diagnosis not present

## 2022-03-28 DIAGNOSIS — D509 Iron deficiency anemia, unspecified: Secondary | ICD-10-CM | POA: Diagnosis not present

## 2022-03-30 DIAGNOSIS — D689 Coagulation defect, unspecified: Secondary | ICD-10-CM | POA: Diagnosis not present

## 2022-03-30 DIAGNOSIS — R52 Pain, unspecified: Secondary | ICD-10-CM | POA: Diagnosis not present

## 2022-03-30 DIAGNOSIS — N2581 Secondary hyperparathyroidism of renal origin: Secondary | ICD-10-CM | POA: Diagnosis not present

## 2022-03-30 DIAGNOSIS — D509 Iron deficiency anemia, unspecified: Secondary | ICD-10-CM | POA: Diagnosis not present

## 2022-03-30 DIAGNOSIS — T7840XA Allergy, unspecified, initial encounter: Secondary | ICD-10-CM | POA: Diagnosis not present

## 2022-03-30 DIAGNOSIS — N186 End stage renal disease: Secondary | ICD-10-CM | POA: Diagnosis not present

## 2022-03-30 DIAGNOSIS — Z992 Dependence on renal dialysis: Secondary | ICD-10-CM | POA: Diagnosis not present

## 2022-03-30 DIAGNOSIS — D631 Anemia in chronic kidney disease: Secondary | ICD-10-CM | POA: Diagnosis not present

## 2022-04-01 DIAGNOSIS — D509 Iron deficiency anemia, unspecified: Secondary | ICD-10-CM | POA: Diagnosis not present

## 2022-04-01 DIAGNOSIS — D689 Coagulation defect, unspecified: Secondary | ICD-10-CM | POA: Diagnosis not present

## 2022-04-01 DIAGNOSIS — D631 Anemia in chronic kidney disease: Secondary | ICD-10-CM | POA: Diagnosis not present

## 2022-04-01 DIAGNOSIS — Z992 Dependence on renal dialysis: Secondary | ICD-10-CM | POA: Diagnosis not present

## 2022-04-01 DIAGNOSIS — R52 Pain, unspecified: Secondary | ICD-10-CM | POA: Diagnosis not present

## 2022-04-01 DIAGNOSIS — N2581 Secondary hyperparathyroidism of renal origin: Secondary | ICD-10-CM | POA: Diagnosis not present

## 2022-04-01 DIAGNOSIS — T7840XA Allergy, unspecified, initial encounter: Secondary | ICD-10-CM | POA: Diagnosis not present

## 2022-04-01 DIAGNOSIS — N186 End stage renal disease: Secondary | ICD-10-CM | POA: Diagnosis not present

## 2022-04-02 DIAGNOSIS — N186 End stage renal disease: Secondary | ICD-10-CM | POA: Diagnosis not present

## 2022-04-02 DIAGNOSIS — N138 Other obstructive and reflux uropathy: Secondary | ICD-10-CM | POA: Diagnosis not present

## 2022-04-02 DIAGNOSIS — Z992 Dependence on renal dialysis: Secondary | ICD-10-CM | POA: Diagnosis not present

## 2022-04-04 DIAGNOSIS — N2581 Secondary hyperparathyroidism of renal origin: Secondary | ICD-10-CM | POA: Diagnosis not present

## 2022-04-04 DIAGNOSIS — Z992 Dependence on renal dialysis: Secondary | ICD-10-CM | POA: Diagnosis not present

## 2022-04-04 DIAGNOSIS — D509 Iron deficiency anemia, unspecified: Secondary | ICD-10-CM | POA: Diagnosis not present

## 2022-04-04 DIAGNOSIS — T7840XA Allergy, unspecified, initial encounter: Secondary | ICD-10-CM | POA: Diagnosis not present

## 2022-04-04 DIAGNOSIS — R52 Pain, unspecified: Secondary | ICD-10-CM | POA: Diagnosis not present

## 2022-04-04 DIAGNOSIS — D631 Anemia in chronic kidney disease: Secondary | ICD-10-CM | POA: Diagnosis not present

## 2022-04-04 DIAGNOSIS — N186 End stage renal disease: Secondary | ICD-10-CM | POA: Diagnosis not present

## 2022-04-04 DIAGNOSIS — D689 Coagulation defect, unspecified: Secondary | ICD-10-CM | POA: Diagnosis not present

## 2022-04-06 DIAGNOSIS — N186 End stage renal disease: Secondary | ICD-10-CM | POA: Diagnosis not present

## 2022-04-06 DIAGNOSIS — T7840XA Allergy, unspecified, initial encounter: Secondary | ICD-10-CM | POA: Diagnosis not present

## 2022-04-06 DIAGNOSIS — R52 Pain, unspecified: Secondary | ICD-10-CM | POA: Diagnosis not present

## 2022-04-06 DIAGNOSIS — D509 Iron deficiency anemia, unspecified: Secondary | ICD-10-CM | POA: Diagnosis not present

## 2022-04-06 DIAGNOSIS — D689 Coagulation defect, unspecified: Secondary | ICD-10-CM | POA: Diagnosis not present

## 2022-04-06 DIAGNOSIS — D631 Anemia in chronic kidney disease: Secondary | ICD-10-CM | POA: Diagnosis not present

## 2022-04-06 DIAGNOSIS — N2581 Secondary hyperparathyroidism of renal origin: Secondary | ICD-10-CM | POA: Diagnosis not present

## 2022-04-06 DIAGNOSIS — Z992 Dependence on renal dialysis: Secondary | ICD-10-CM | POA: Diagnosis not present

## 2022-04-08 DIAGNOSIS — Z992 Dependence on renal dialysis: Secondary | ICD-10-CM | POA: Diagnosis not present

## 2022-04-08 DIAGNOSIS — D631 Anemia in chronic kidney disease: Secondary | ICD-10-CM | POA: Diagnosis not present

## 2022-04-08 DIAGNOSIS — N186 End stage renal disease: Secondary | ICD-10-CM | POA: Diagnosis not present

## 2022-04-08 DIAGNOSIS — R52 Pain, unspecified: Secondary | ICD-10-CM | POA: Diagnosis not present

## 2022-04-08 DIAGNOSIS — D509 Iron deficiency anemia, unspecified: Secondary | ICD-10-CM | POA: Diagnosis not present

## 2022-04-08 DIAGNOSIS — T7840XA Allergy, unspecified, initial encounter: Secondary | ICD-10-CM | POA: Diagnosis not present

## 2022-04-08 DIAGNOSIS — N2581 Secondary hyperparathyroidism of renal origin: Secondary | ICD-10-CM | POA: Diagnosis not present

## 2022-04-08 DIAGNOSIS — D689 Coagulation defect, unspecified: Secondary | ICD-10-CM | POA: Diagnosis not present

## 2022-04-11 DIAGNOSIS — D509 Iron deficiency anemia, unspecified: Secondary | ICD-10-CM | POA: Diagnosis not present

## 2022-04-11 DIAGNOSIS — T7840XA Allergy, unspecified, initial encounter: Secondary | ICD-10-CM | POA: Diagnosis not present

## 2022-04-11 DIAGNOSIS — N2581 Secondary hyperparathyroidism of renal origin: Secondary | ICD-10-CM | POA: Diagnosis not present

## 2022-04-11 DIAGNOSIS — D631 Anemia in chronic kidney disease: Secondary | ICD-10-CM | POA: Diagnosis not present

## 2022-04-11 DIAGNOSIS — R52 Pain, unspecified: Secondary | ICD-10-CM | POA: Diagnosis not present

## 2022-04-11 DIAGNOSIS — Z992 Dependence on renal dialysis: Secondary | ICD-10-CM | POA: Diagnosis not present

## 2022-04-11 DIAGNOSIS — N186 End stage renal disease: Secondary | ICD-10-CM | POA: Diagnosis not present

## 2022-04-11 DIAGNOSIS — D689 Coagulation defect, unspecified: Secondary | ICD-10-CM | POA: Diagnosis not present

## 2022-04-12 ENCOUNTER — Encounter: Payer: Self-pay | Admitting: Internal Medicine

## 2022-04-13 DIAGNOSIS — D689 Coagulation defect, unspecified: Secondary | ICD-10-CM | POA: Diagnosis not present

## 2022-04-13 DIAGNOSIS — N186 End stage renal disease: Secondary | ICD-10-CM | POA: Diagnosis not present

## 2022-04-13 DIAGNOSIS — D631 Anemia in chronic kidney disease: Secondary | ICD-10-CM | POA: Diagnosis not present

## 2022-04-13 DIAGNOSIS — Z992 Dependence on renal dialysis: Secondary | ICD-10-CM | POA: Diagnosis not present

## 2022-04-13 DIAGNOSIS — R52 Pain, unspecified: Secondary | ICD-10-CM | POA: Diagnosis not present

## 2022-04-13 DIAGNOSIS — D509 Iron deficiency anemia, unspecified: Secondary | ICD-10-CM | POA: Diagnosis not present

## 2022-04-13 DIAGNOSIS — N2581 Secondary hyperparathyroidism of renal origin: Secondary | ICD-10-CM | POA: Diagnosis not present

## 2022-04-13 DIAGNOSIS — T7840XA Allergy, unspecified, initial encounter: Secondary | ICD-10-CM | POA: Diagnosis not present

## 2022-04-15 DIAGNOSIS — R52 Pain, unspecified: Secondary | ICD-10-CM | POA: Diagnosis not present

## 2022-04-15 DIAGNOSIS — D509 Iron deficiency anemia, unspecified: Secondary | ICD-10-CM | POA: Diagnosis not present

## 2022-04-15 DIAGNOSIS — T7840XA Allergy, unspecified, initial encounter: Secondary | ICD-10-CM | POA: Diagnosis not present

## 2022-04-15 DIAGNOSIS — N2581 Secondary hyperparathyroidism of renal origin: Secondary | ICD-10-CM | POA: Diagnosis not present

## 2022-04-15 DIAGNOSIS — Z992 Dependence on renal dialysis: Secondary | ICD-10-CM | POA: Diagnosis not present

## 2022-04-15 DIAGNOSIS — D631 Anemia in chronic kidney disease: Secondary | ICD-10-CM | POA: Diagnosis not present

## 2022-04-15 DIAGNOSIS — D689 Coagulation defect, unspecified: Secondary | ICD-10-CM | POA: Diagnosis not present

## 2022-04-15 DIAGNOSIS — N186 End stage renal disease: Secondary | ICD-10-CM | POA: Diagnosis not present

## 2022-04-18 DIAGNOSIS — R52 Pain, unspecified: Secondary | ICD-10-CM | POA: Diagnosis not present

## 2022-04-18 DIAGNOSIS — D689 Coagulation defect, unspecified: Secondary | ICD-10-CM | POA: Diagnosis not present

## 2022-04-18 DIAGNOSIS — Z992 Dependence on renal dialysis: Secondary | ICD-10-CM | POA: Diagnosis not present

## 2022-04-18 DIAGNOSIS — D631 Anemia in chronic kidney disease: Secondary | ICD-10-CM | POA: Diagnosis not present

## 2022-04-18 DIAGNOSIS — N2581 Secondary hyperparathyroidism of renal origin: Secondary | ICD-10-CM | POA: Diagnosis not present

## 2022-04-18 DIAGNOSIS — D509 Iron deficiency anemia, unspecified: Secondary | ICD-10-CM | POA: Diagnosis not present

## 2022-04-18 DIAGNOSIS — T7840XA Allergy, unspecified, initial encounter: Secondary | ICD-10-CM | POA: Diagnosis not present

## 2022-04-18 DIAGNOSIS — N186 End stage renal disease: Secondary | ICD-10-CM | POA: Diagnosis not present

## 2022-04-20 DIAGNOSIS — Z992 Dependence on renal dialysis: Secondary | ICD-10-CM | POA: Diagnosis not present

## 2022-04-20 DIAGNOSIS — D509 Iron deficiency anemia, unspecified: Secondary | ICD-10-CM | POA: Diagnosis not present

## 2022-04-20 DIAGNOSIS — D689 Coagulation defect, unspecified: Secondary | ICD-10-CM | POA: Diagnosis not present

## 2022-04-20 DIAGNOSIS — R52 Pain, unspecified: Secondary | ICD-10-CM | POA: Diagnosis not present

## 2022-04-20 DIAGNOSIS — T7840XA Allergy, unspecified, initial encounter: Secondary | ICD-10-CM | POA: Diagnosis not present

## 2022-04-20 DIAGNOSIS — N186 End stage renal disease: Secondary | ICD-10-CM | POA: Diagnosis not present

## 2022-04-20 DIAGNOSIS — D631 Anemia in chronic kidney disease: Secondary | ICD-10-CM | POA: Diagnosis not present

## 2022-04-20 DIAGNOSIS — N2581 Secondary hyperparathyroidism of renal origin: Secondary | ICD-10-CM | POA: Diagnosis not present

## 2022-04-22 DIAGNOSIS — T7840XA Allergy, unspecified, initial encounter: Secondary | ICD-10-CM | POA: Diagnosis not present

## 2022-04-22 DIAGNOSIS — N2581 Secondary hyperparathyroidism of renal origin: Secondary | ICD-10-CM | POA: Diagnosis not present

## 2022-04-22 DIAGNOSIS — R52 Pain, unspecified: Secondary | ICD-10-CM | POA: Diagnosis not present

## 2022-04-22 DIAGNOSIS — N186 End stage renal disease: Secondary | ICD-10-CM | POA: Diagnosis not present

## 2022-04-22 DIAGNOSIS — D509 Iron deficiency anemia, unspecified: Secondary | ICD-10-CM | POA: Diagnosis not present

## 2022-04-22 DIAGNOSIS — Z992 Dependence on renal dialysis: Secondary | ICD-10-CM | POA: Diagnosis not present

## 2022-04-22 DIAGNOSIS — D631 Anemia in chronic kidney disease: Secondary | ICD-10-CM | POA: Diagnosis not present

## 2022-04-22 DIAGNOSIS — D689 Coagulation defect, unspecified: Secondary | ICD-10-CM | POA: Diagnosis not present

## 2022-04-25 ENCOUNTER — Encounter: Payer: Self-pay | Admitting: Internal Medicine

## 2022-04-25 DIAGNOSIS — T7840XA Allergy, unspecified, initial encounter: Secondary | ICD-10-CM | POA: Diagnosis not present

## 2022-04-25 DIAGNOSIS — D689 Coagulation defect, unspecified: Secondary | ICD-10-CM | POA: Diagnosis not present

## 2022-04-25 DIAGNOSIS — D509 Iron deficiency anemia, unspecified: Secondary | ICD-10-CM | POA: Diagnosis not present

## 2022-04-25 DIAGNOSIS — Z992 Dependence on renal dialysis: Secondary | ICD-10-CM | POA: Diagnosis not present

## 2022-04-25 DIAGNOSIS — N2581 Secondary hyperparathyroidism of renal origin: Secondary | ICD-10-CM | POA: Diagnosis not present

## 2022-04-25 DIAGNOSIS — D631 Anemia in chronic kidney disease: Secondary | ICD-10-CM | POA: Diagnosis not present

## 2022-04-25 DIAGNOSIS — N186 End stage renal disease: Secondary | ICD-10-CM | POA: Diagnosis not present

## 2022-04-25 DIAGNOSIS — R52 Pain, unspecified: Secondary | ICD-10-CM | POA: Diagnosis not present

## 2022-04-27 DIAGNOSIS — Z992 Dependence on renal dialysis: Secondary | ICD-10-CM | POA: Diagnosis not present

## 2022-04-27 DIAGNOSIS — D509 Iron deficiency anemia, unspecified: Secondary | ICD-10-CM | POA: Diagnosis not present

## 2022-04-27 DIAGNOSIS — R52 Pain, unspecified: Secondary | ICD-10-CM | POA: Diagnosis not present

## 2022-04-27 DIAGNOSIS — T7840XA Allergy, unspecified, initial encounter: Secondary | ICD-10-CM | POA: Diagnosis not present

## 2022-04-27 DIAGNOSIS — D631 Anemia in chronic kidney disease: Secondary | ICD-10-CM | POA: Diagnosis not present

## 2022-04-27 DIAGNOSIS — N2581 Secondary hyperparathyroidism of renal origin: Secondary | ICD-10-CM | POA: Diagnosis not present

## 2022-04-27 DIAGNOSIS — D689 Coagulation defect, unspecified: Secondary | ICD-10-CM | POA: Diagnosis not present

## 2022-04-27 DIAGNOSIS — N186 End stage renal disease: Secondary | ICD-10-CM | POA: Diagnosis not present

## 2022-04-29 DIAGNOSIS — D631 Anemia in chronic kidney disease: Secondary | ICD-10-CM | POA: Diagnosis not present

## 2022-04-29 DIAGNOSIS — D509 Iron deficiency anemia, unspecified: Secondary | ICD-10-CM | POA: Diagnosis not present

## 2022-04-29 DIAGNOSIS — T7840XA Allergy, unspecified, initial encounter: Secondary | ICD-10-CM | POA: Diagnosis not present

## 2022-04-29 DIAGNOSIS — R52 Pain, unspecified: Secondary | ICD-10-CM | POA: Diagnosis not present

## 2022-04-29 DIAGNOSIS — N2581 Secondary hyperparathyroidism of renal origin: Secondary | ICD-10-CM | POA: Diagnosis not present

## 2022-04-29 DIAGNOSIS — Z992 Dependence on renal dialysis: Secondary | ICD-10-CM | POA: Diagnosis not present

## 2022-04-29 DIAGNOSIS — N186 End stage renal disease: Secondary | ICD-10-CM | POA: Diagnosis not present

## 2022-04-29 DIAGNOSIS — D689 Coagulation defect, unspecified: Secondary | ICD-10-CM | POA: Diagnosis not present

## 2022-05-02 DIAGNOSIS — N186 End stage renal disease: Secondary | ICD-10-CM | POA: Diagnosis not present

## 2022-05-02 DIAGNOSIS — Z992 Dependence on renal dialysis: Secondary | ICD-10-CM | POA: Diagnosis not present

## 2022-05-02 DIAGNOSIS — R52 Pain, unspecified: Secondary | ICD-10-CM | POA: Diagnosis not present

## 2022-05-02 DIAGNOSIS — D509 Iron deficiency anemia, unspecified: Secondary | ICD-10-CM | POA: Diagnosis not present

## 2022-05-02 DIAGNOSIS — T7840XA Allergy, unspecified, initial encounter: Secondary | ICD-10-CM | POA: Diagnosis not present

## 2022-05-02 DIAGNOSIS — D689 Coagulation defect, unspecified: Secondary | ICD-10-CM | POA: Diagnosis not present

## 2022-05-02 DIAGNOSIS — D631 Anemia in chronic kidney disease: Secondary | ICD-10-CM | POA: Diagnosis not present

## 2022-05-02 DIAGNOSIS — N2581 Secondary hyperparathyroidism of renal origin: Secondary | ICD-10-CM | POA: Diagnosis not present

## 2022-05-03 DIAGNOSIS — N186 End stage renal disease: Secondary | ICD-10-CM | POA: Diagnosis not present

## 2022-05-03 DIAGNOSIS — N138 Other obstructive and reflux uropathy: Secondary | ICD-10-CM | POA: Diagnosis not present

## 2022-05-03 DIAGNOSIS — Z992 Dependence on renal dialysis: Secondary | ICD-10-CM | POA: Diagnosis not present

## 2022-05-04 DIAGNOSIS — N2581 Secondary hyperparathyroidism of renal origin: Secondary | ICD-10-CM | POA: Diagnosis not present

## 2022-05-04 DIAGNOSIS — D689 Coagulation defect, unspecified: Secondary | ICD-10-CM | POA: Diagnosis not present

## 2022-05-04 DIAGNOSIS — R52 Pain, unspecified: Secondary | ICD-10-CM | POA: Diagnosis not present

## 2022-05-04 DIAGNOSIS — D631 Anemia in chronic kidney disease: Secondary | ICD-10-CM | POA: Diagnosis not present

## 2022-05-04 DIAGNOSIS — Z992 Dependence on renal dialysis: Secondary | ICD-10-CM | POA: Diagnosis not present

## 2022-05-04 DIAGNOSIS — D509 Iron deficiency anemia, unspecified: Secondary | ICD-10-CM | POA: Diagnosis not present

## 2022-05-04 DIAGNOSIS — N186 End stage renal disease: Secondary | ICD-10-CM | POA: Diagnosis not present

## 2022-05-04 DIAGNOSIS — T7840XA Allergy, unspecified, initial encounter: Secondary | ICD-10-CM | POA: Diagnosis not present

## 2022-05-06 DIAGNOSIS — D631 Anemia in chronic kidney disease: Secondary | ICD-10-CM | POA: Diagnosis not present

## 2022-05-06 DIAGNOSIS — D509 Iron deficiency anemia, unspecified: Secondary | ICD-10-CM | POA: Diagnosis not present

## 2022-05-06 DIAGNOSIS — D689 Coagulation defect, unspecified: Secondary | ICD-10-CM | POA: Diagnosis not present

## 2022-05-06 DIAGNOSIS — N186 End stage renal disease: Secondary | ICD-10-CM | POA: Diagnosis not present

## 2022-05-06 DIAGNOSIS — Z992 Dependence on renal dialysis: Secondary | ICD-10-CM | POA: Diagnosis not present

## 2022-05-06 DIAGNOSIS — T7840XA Allergy, unspecified, initial encounter: Secondary | ICD-10-CM | POA: Diagnosis not present

## 2022-05-06 DIAGNOSIS — R52 Pain, unspecified: Secondary | ICD-10-CM | POA: Diagnosis not present

## 2022-05-06 DIAGNOSIS — N2581 Secondary hyperparathyroidism of renal origin: Secondary | ICD-10-CM | POA: Diagnosis not present

## 2022-05-09 DIAGNOSIS — N2581 Secondary hyperparathyroidism of renal origin: Secondary | ICD-10-CM | POA: Diagnosis not present

## 2022-05-09 DIAGNOSIS — D689 Coagulation defect, unspecified: Secondary | ICD-10-CM | POA: Diagnosis not present

## 2022-05-09 DIAGNOSIS — Z992 Dependence on renal dialysis: Secondary | ICD-10-CM | POA: Diagnosis not present

## 2022-05-09 DIAGNOSIS — D631 Anemia in chronic kidney disease: Secondary | ICD-10-CM | POA: Diagnosis not present

## 2022-05-09 DIAGNOSIS — D509 Iron deficiency anemia, unspecified: Secondary | ICD-10-CM | POA: Diagnosis not present

## 2022-05-09 DIAGNOSIS — N186 End stage renal disease: Secondary | ICD-10-CM | POA: Diagnosis not present

## 2022-05-09 DIAGNOSIS — R52 Pain, unspecified: Secondary | ICD-10-CM | POA: Diagnosis not present

## 2022-05-09 DIAGNOSIS — T7840XA Allergy, unspecified, initial encounter: Secondary | ICD-10-CM | POA: Diagnosis not present

## 2022-05-11 ENCOUNTER — Non-Acute Institutional Stay (HOSPITAL_COMMUNITY)
Admission: RE | Admit: 2022-05-11 | Discharge: 2022-05-11 | Disposition: A | Discharge status: Home / Self Care | Payer: Medicare Other | Source: Ambulatory Visit | Attending: Internal Medicine | Admitting: Internal Medicine

## 2022-05-11 VITALS — BP 183/78 | HR 63 | Temp 97.5°F | Resp 16

## 2022-05-11 DIAGNOSIS — D631 Anemia in chronic kidney disease: Secondary | ICD-10-CM | POA: Insufficient documentation

## 2022-05-11 DIAGNOSIS — N186 End stage renal disease: Secondary | ICD-10-CM | POA: Diagnosis not present

## 2022-05-11 DIAGNOSIS — D649 Anemia, unspecified: Secondary | ICD-10-CM

## 2022-05-11 LAB — CBC
HCT: 21.3 % — ABNORMAL LOW (ref 39.0–52.0)
Hemoglobin: 7.4 g/dL — ABNORMAL LOW (ref 13.0–17.0)
MCH: 28.1 pg (ref 26.0–34.0)
MCHC: 34.7 g/dL (ref 30.0–36.0)
MCV: 81 fL (ref 80.0–100.0)
Platelets: 206 10*3/uL (ref 150–400)
RBC: 2.63 MIL/uL — ABNORMAL LOW (ref 4.22–5.81)
RDW: 13.5 % (ref 11.5–15.5)
WBC: 8.8 10*3/uL (ref 4.0–10.5)
nRBC: 0 % (ref 0.0–0.2)

## 2022-05-11 LAB — PREPARE RBC (CROSSMATCH)

## 2022-05-11 MED ORDER — SODIUM CHLORIDE 0.9% IV SOLUTION: Freq: Once | INTRAVENOUS | Status: AC

## 2022-05-11 MED ORDER — ACETAMINOPHEN 325 MG PO TABS
650.0000 mg | ORAL_TABLET | Freq: Once | ORAL | Status: AC
  Administered 2022-05-11: 650 mg via ORAL
  Filled 2022-05-11: qty 2

## 2022-05-11 MED ORDER — DIPHENHYDRAMINE HCL 25 MG PO CAPS
25.0000 mg | ORAL_CAPSULE | Freq: Once | ORAL | Status: AC
  Administered 2022-05-11: 25 mg via ORAL
  Filled 2022-05-11: qty 1

## 2022-05-11 NOTE — Progress Notes (Signed)
PATIENT CARE CENTER NOTE   Diagnosis: Anemia, ESRD   Provider: Madelon Lips, MD   Procedure: Blood transfusion    Note: Patient received 1 unit PRBCs via PIV. Type & Screen and CBC drawn prior to transfusion. Patient's Hemoglobin 7.4. Pre-medications (Tylenol and Benadryl) given per order. Transfusion given over 3 hours per order.  Patient tolerated transfusion well with no adverse reaction. BP slightly elevated throughout transfusion. Patient did not take BP medication today. BP 183/78 at discharge. Patient will take BP medications when he gets home.   Discharge instructions given. Patient alert, oriented and ambulatory at discharge.

## 2022-05-12 DIAGNOSIS — D509 Iron deficiency anemia, unspecified: Secondary | ICD-10-CM | POA: Diagnosis not present

## 2022-05-12 DIAGNOSIS — R52 Pain, unspecified: Secondary | ICD-10-CM | POA: Diagnosis not present

## 2022-05-12 DIAGNOSIS — N186 End stage renal disease: Secondary | ICD-10-CM | POA: Diagnosis not present

## 2022-05-12 DIAGNOSIS — D631 Anemia in chronic kidney disease: Secondary | ICD-10-CM | POA: Diagnosis not present

## 2022-05-12 DIAGNOSIS — N2581 Secondary hyperparathyroidism of renal origin: Secondary | ICD-10-CM | POA: Diagnosis not present

## 2022-05-12 DIAGNOSIS — D689 Coagulation defect, unspecified: Secondary | ICD-10-CM | POA: Diagnosis not present

## 2022-05-12 DIAGNOSIS — Z992 Dependence on renal dialysis: Secondary | ICD-10-CM | POA: Diagnosis not present

## 2022-05-12 DIAGNOSIS — T7840XA Allergy, unspecified, initial encounter: Secondary | ICD-10-CM | POA: Diagnosis not present

## 2022-05-12 LAB — TYPE AND SCREEN
ABO/RH(D): B POS
Antibody Screen: NEGATIVE
Unit division: 0

## 2022-05-12 LAB — BPAM RBC
Blood Product Expiration Date: 202311282359
ISSUE DATE / TIME: 202311081126
Unit Type and Rh: 7300

## 2022-05-13 DIAGNOSIS — T7840XA Allergy, unspecified, initial encounter: Secondary | ICD-10-CM | POA: Diagnosis not present

## 2022-05-13 DIAGNOSIS — N2581 Secondary hyperparathyroidism of renal origin: Secondary | ICD-10-CM | POA: Diagnosis not present

## 2022-05-13 DIAGNOSIS — N186 End stage renal disease: Secondary | ICD-10-CM | POA: Diagnosis not present

## 2022-05-13 DIAGNOSIS — Z992 Dependence on renal dialysis: Secondary | ICD-10-CM | POA: Diagnosis not present

## 2022-05-13 DIAGNOSIS — R52 Pain, unspecified: Secondary | ICD-10-CM | POA: Diagnosis not present

## 2022-05-13 DIAGNOSIS — D689 Coagulation defect, unspecified: Secondary | ICD-10-CM | POA: Diagnosis not present

## 2022-05-13 DIAGNOSIS — D509 Iron deficiency anemia, unspecified: Secondary | ICD-10-CM | POA: Diagnosis not present

## 2022-05-13 DIAGNOSIS — D631 Anemia in chronic kidney disease: Secondary | ICD-10-CM | POA: Diagnosis not present

## 2022-05-16 DIAGNOSIS — N186 End stage renal disease: Secondary | ICD-10-CM | POA: Diagnosis not present

## 2022-05-16 DIAGNOSIS — R52 Pain, unspecified: Secondary | ICD-10-CM | POA: Diagnosis not present

## 2022-05-16 DIAGNOSIS — Z992 Dependence on renal dialysis: Secondary | ICD-10-CM | POA: Diagnosis not present

## 2022-05-16 DIAGNOSIS — D631 Anemia in chronic kidney disease: Secondary | ICD-10-CM | POA: Diagnosis not present

## 2022-05-16 DIAGNOSIS — N2581 Secondary hyperparathyroidism of renal origin: Secondary | ICD-10-CM | POA: Diagnosis not present

## 2022-05-16 DIAGNOSIS — D509 Iron deficiency anemia, unspecified: Secondary | ICD-10-CM | POA: Diagnosis not present

## 2022-05-16 DIAGNOSIS — T7840XA Allergy, unspecified, initial encounter: Secondary | ICD-10-CM | POA: Diagnosis not present

## 2022-05-16 DIAGNOSIS — D689 Coagulation defect, unspecified: Secondary | ICD-10-CM | POA: Diagnosis not present

## 2022-05-18 DIAGNOSIS — N186 End stage renal disease: Secondary | ICD-10-CM | POA: Diagnosis not present

## 2022-05-18 DIAGNOSIS — T7840XA Allergy, unspecified, initial encounter: Secondary | ICD-10-CM | POA: Diagnosis not present

## 2022-05-18 DIAGNOSIS — D631 Anemia in chronic kidney disease: Secondary | ICD-10-CM | POA: Diagnosis not present

## 2022-05-18 DIAGNOSIS — N2581 Secondary hyperparathyroidism of renal origin: Secondary | ICD-10-CM | POA: Diagnosis not present

## 2022-05-18 DIAGNOSIS — D509 Iron deficiency anemia, unspecified: Secondary | ICD-10-CM | POA: Diagnosis not present

## 2022-05-18 DIAGNOSIS — D689 Coagulation defect, unspecified: Secondary | ICD-10-CM | POA: Diagnosis not present

## 2022-05-18 DIAGNOSIS — Z992 Dependence on renal dialysis: Secondary | ICD-10-CM | POA: Diagnosis not present

## 2022-05-18 DIAGNOSIS — R52 Pain, unspecified: Secondary | ICD-10-CM | POA: Diagnosis not present

## 2022-05-19 ENCOUNTER — Other Ambulatory Visit: Payer: Self-pay | Admitting: Urology

## 2022-05-19 DIAGNOSIS — N130 Hydronephrosis with ureteropelvic junction obstruction: Secondary | ICD-10-CM | POA: Diagnosis not present

## 2022-05-19 DIAGNOSIS — C7951 Secondary malignant neoplasm of bone: Secondary | ICD-10-CM | POA: Diagnosis not present

## 2022-05-20 DIAGNOSIS — T7840XA Allergy, unspecified, initial encounter: Secondary | ICD-10-CM | POA: Diagnosis not present

## 2022-05-20 DIAGNOSIS — N186 End stage renal disease: Secondary | ICD-10-CM | POA: Diagnosis not present

## 2022-05-20 DIAGNOSIS — R52 Pain, unspecified: Secondary | ICD-10-CM | POA: Diagnosis not present

## 2022-05-20 DIAGNOSIS — D689 Coagulation defect, unspecified: Secondary | ICD-10-CM | POA: Diagnosis not present

## 2022-05-20 DIAGNOSIS — D509 Iron deficiency anemia, unspecified: Secondary | ICD-10-CM | POA: Diagnosis not present

## 2022-05-20 DIAGNOSIS — Z992 Dependence on renal dialysis: Secondary | ICD-10-CM | POA: Diagnosis not present

## 2022-05-20 DIAGNOSIS — D631 Anemia in chronic kidney disease: Secondary | ICD-10-CM | POA: Diagnosis not present

## 2022-05-20 DIAGNOSIS — N2581 Secondary hyperparathyroidism of renal origin: Secondary | ICD-10-CM | POA: Diagnosis not present

## 2022-05-23 DIAGNOSIS — T7840XA Allergy, unspecified, initial encounter: Secondary | ICD-10-CM | POA: Diagnosis not present

## 2022-05-23 DIAGNOSIS — N186 End stage renal disease: Secondary | ICD-10-CM | POA: Diagnosis not present

## 2022-05-23 DIAGNOSIS — D509 Iron deficiency anemia, unspecified: Secondary | ICD-10-CM | POA: Diagnosis not present

## 2022-05-23 DIAGNOSIS — D689 Coagulation defect, unspecified: Secondary | ICD-10-CM | POA: Diagnosis not present

## 2022-05-23 DIAGNOSIS — N2581 Secondary hyperparathyroidism of renal origin: Secondary | ICD-10-CM | POA: Diagnosis not present

## 2022-05-23 DIAGNOSIS — R52 Pain, unspecified: Secondary | ICD-10-CM | POA: Diagnosis not present

## 2022-05-23 DIAGNOSIS — Z992 Dependence on renal dialysis: Secondary | ICD-10-CM | POA: Diagnosis not present

## 2022-05-23 DIAGNOSIS — D631 Anemia in chronic kidney disease: Secondary | ICD-10-CM | POA: Diagnosis not present

## 2022-05-24 DIAGNOSIS — N2581 Secondary hyperparathyroidism of renal origin: Secondary | ICD-10-CM | POA: Diagnosis not present

## 2022-05-24 DIAGNOSIS — D631 Anemia in chronic kidney disease: Secondary | ICD-10-CM | POA: Diagnosis not present

## 2022-05-24 DIAGNOSIS — T7840XA Allergy, unspecified, initial encounter: Secondary | ICD-10-CM | POA: Diagnosis not present

## 2022-05-24 DIAGNOSIS — N186 End stage renal disease: Secondary | ICD-10-CM | POA: Diagnosis not present

## 2022-05-24 DIAGNOSIS — D689 Coagulation defect, unspecified: Secondary | ICD-10-CM | POA: Diagnosis not present

## 2022-05-24 DIAGNOSIS — R52 Pain, unspecified: Secondary | ICD-10-CM | POA: Diagnosis not present

## 2022-05-24 DIAGNOSIS — Z992 Dependence on renal dialysis: Secondary | ICD-10-CM | POA: Diagnosis not present

## 2022-05-24 DIAGNOSIS — D509 Iron deficiency anemia, unspecified: Secondary | ICD-10-CM | POA: Diagnosis not present

## 2022-05-27 DIAGNOSIS — D689 Coagulation defect, unspecified: Secondary | ICD-10-CM | POA: Diagnosis not present

## 2022-05-27 DIAGNOSIS — D631 Anemia in chronic kidney disease: Secondary | ICD-10-CM | POA: Diagnosis not present

## 2022-05-27 DIAGNOSIS — R52 Pain, unspecified: Secondary | ICD-10-CM | POA: Diagnosis not present

## 2022-05-27 DIAGNOSIS — T7840XA Allergy, unspecified, initial encounter: Secondary | ICD-10-CM | POA: Diagnosis not present

## 2022-05-27 DIAGNOSIS — N186 End stage renal disease: Secondary | ICD-10-CM | POA: Diagnosis not present

## 2022-05-27 DIAGNOSIS — Z992 Dependence on renal dialysis: Secondary | ICD-10-CM | POA: Diagnosis not present

## 2022-05-27 DIAGNOSIS — N2581 Secondary hyperparathyroidism of renal origin: Secondary | ICD-10-CM | POA: Diagnosis not present

## 2022-05-27 DIAGNOSIS — D509 Iron deficiency anemia, unspecified: Secondary | ICD-10-CM | POA: Diagnosis not present

## 2022-05-30 DIAGNOSIS — T7840XA Allergy, unspecified, initial encounter: Secondary | ICD-10-CM | POA: Diagnosis not present

## 2022-05-30 DIAGNOSIS — N2581 Secondary hyperparathyroidism of renal origin: Secondary | ICD-10-CM | POA: Diagnosis not present

## 2022-05-30 DIAGNOSIS — N186 End stage renal disease: Secondary | ICD-10-CM | POA: Diagnosis not present

## 2022-05-30 DIAGNOSIS — D631 Anemia in chronic kidney disease: Secondary | ICD-10-CM | POA: Diagnosis not present

## 2022-05-30 DIAGNOSIS — D689 Coagulation defect, unspecified: Secondary | ICD-10-CM | POA: Diagnosis not present

## 2022-05-30 DIAGNOSIS — Z992 Dependence on renal dialysis: Secondary | ICD-10-CM | POA: Diagnosis not present

## 2022-05-30 DIAGNOSIS — D509 Iron deficiency anemia, unspecified: Secondary | ICD-10-CM | POA: Diagnosis not present

## 2022-05-30 DIAGNOSIS — R52 Pain, unspecified: Secondary | ICD-10-CM | POA: Diagnosis not present

## 2022-06-01 ENCOUNTER — Ambulatory Visit: Payer: Medicare Other | Admitting: Medical

## 2022-06-01 DIAGNOSIS — D631 Anemia in chronic kidney disease: Secondary | ICD-10-CM | POA: Diagnosis not present

## 2022-06-01 DIAGNOSIS — D509 Iron deficiency anemia, unspecified: Secondary | ICD-10-CM | POA: Diagnosis not present

## 2022-06-01 DIAGNOSIS — N2581 Secondary hyperparathyroidism of renal origin: Secondary | ICD-10-CM | POA: Diagnosis not present

## 2022-06-01 DIAGNOSIS — R52 Pain, unspecified: Secondary | ICD-10-CM | POA: Diagnosis not present

## 2022-06-01 DIAGNOSIS — N186 End stage renal disease: Secondary | ICD-10-CM | POA: Diagnosis not present

## 2022-06-01 DIAGNOSIS — D689 Coagulation defect, unspecified: Secondary | ICD-10-CM | POA: Diagnosis not present

## 2022-06-01 DIAGNOSIS — T7840XA Allergy, unspecified, initial encounter: Secondary | ICD-10-CM | POA: Diagnosis not present

## 2022-06-01 DIAGNOSIS — Z992 Dependence on renal dialysis: Secondary | ICD-10-CM | POA: Diagnosis not present

## 2022-06-02 ENCOUNTER — Telehealth: Payer: Self-pay | Admitting: Family Medicine

## 2022-06-02 ENCOUNTER — Other Ambulatory Visit: Payer: Self-pay

## 2022-06-02 DIAGNOSIS — Z992 Dependence on renal dialysis: Secondary | ICD-10-CM | POA: Diagnosis not present

## 2022-06-02 DIAGNOSIS — N186 End stage renal disease: Secondary | ICD-10-CM | POA: Diagnosis not present

## 2022-06-02 DIAGNOSIS — N138 Other obstructive and reflux uropathy: Secondary | ICD-10-CM | POA: Diagnosis not present

## 2022-06-02 MED ORDER — AMLODIPINE BESYLATE 10 MG PO TABS: 10.0000 mg | ORAL_TABLET | Freq: Every day | ORAL | 1 refills | Status: DC

## 2022-06-02 NOTE — Telephone Encounter (Signed)
Devon Cook requesting a [facility]ine [facility]  [id number] [place] [facility] [address]

## 2022-06-03 DIAGNOSIS — R52 Pain, unspecified: Secondary | ICD-10-CM | POA: Diagnosis not present

## 2022-06-03 DIAGNOSIS — Z992 Dependence on renal dialysis: Secondary | ICD-10-CM | POA: Diagnosis not present

## 2022-06-03 DIAGNOSIS — D689 Coagulation defect, unspecified: Secondary | ICD-10-CM | POA: Diagnosis not present

## 2022-06-03 DIAGNOSIS — T7840XA Allergy, unspecified, initial encounter: Secondary | ICD-10-CM | POA: Diagnosis not present

## 2022-06-03 DIAGNOSIS — D509 Iron deficiency anemia, unspecified: Secondary | ICD-10-CM | POA: Diagnosis not present

## 2022-06-03 DIAGNOSIS — N2581 Secondary hyperparathyroidism of renal origin: Secondary | ICD-10-CM | POA: Diagnosis not present

## 2022-06-03 DIAGNOSIS — N186 End stage renal disease: Secondary | ICD-10-CM | POA: Diagnosis not present

## 2022-06-03 DIAGNOSIS — D631 Anemia in chronic kidney disease: Secondary | ICD-10-CM | POA: Diagnosis not present

## 2022-06-06 DIAGNOSIS — R52 Pain, unspecified: Secondary | ICD-10-CM | POA: Diagnosis not present

## 2022-06-06 DIAGNOSIS — D509 Iron deficiency anemia, unspecified: Secondary | ICD-10-CM | POA: Diagnosis not present

## 2022-06-06 DIAGNOSIS — D689 Coagulation defect, unspecified: Secondary | ICD-10-CM | POA: Diagnosis not present

## 2022-06-06 DIAGNOSIS — D631 Anemia in chronic kidney disease: Secondary | ICD-10-CM | POA: Diagnosis not present

## 2022-06-06 DIAGNOSIS — Z992 Dependence on renal dialysis: Secondary | ICD-10-CM | POA: Diagnosis not present

## 2022-06-06 DIAGNOSIS — T7840XA Allergy, unspecified, initial encounter: Secondary | ICD-10-CM | POA: Diagnosis not present

## 2022-06-06 DIAGNOSIS — N186 End stage renal disease: Secondary | ICD-10-CM | POA: Diagnosis not present

## 2022-06-06 DIAGNOSIS — N2581 Secondary hyperparathyroidism of renal origin: Secondary | ICD-10-CM | POA: Diagnosis not present

## 2022-06-07 ENCOUNTER — Non-Acute Institutional Stay (HOSPITAL_COMMUNITY)
Admission: RE | Admit: 2022-06-07 | Discharge: 2022-06-07 | Disposition: A | Discharge status: Home / Self Care | Payer: Medicare Other | Source: Ambulatory Visit | Attending: Internal Medicine | Admitting: Internal Medicine

## 2022-06-07 VITALS — BP 152/80 | HR 72 | Temp 97.6°F | Resp 16

## 2022-06-07 DIAGNOSIS — D631 Anemia in chronic kidney disease: Secondary | ICD-10-CM | POA: Insufficient documentation

## 2022-06-07 DIAGNOSIS — N186 End stage renal disease: Secondary | ICD-10-CM | POA: Insufficient documentation

## 2022-06-07 DIAGNOSIS — D649 Anemia, unspecified: Secondary | ICD-10-CM

## 2022-06-07 LAB — CBC
HCT: 21.9 % — ABNORMAL LOW (ref 39.0–52.0)
Hemoglobin: 7.6 g/dL — ABNORMAL LOW (ref 13.0–17.0)
MCH: 28.4 pg (ref 26.0–34.0)
MCHC: 34.7 g/dL (ref 30.0–36.0)
MCV: 81.7 fL (ref 80.0–100.0)
Platelets: 264 10*3/uL (ref 150–400)
RBC: 2.68 MIL/uL — ABNORMAL LOW (ref 4.22–5.81)
RDW: 13.7 % (ref 11.5–15.5)
WBC: 10.8 10*3/uL — ABNORMAL HIGH (ref 4.0–10.5)
nRBC: 0 % (ref 0.0–0.2)

## 2022-06-07 LAB — PREPARE RBC (CROSSMATCH)

## 2022-06-07 MED ORDER — SODIUM CHLORIDE 0.9% IV SOLUTION: Freq: Once | INTRAVENOUS | Status: AC

## 2022-06-07 MED ORDER — ACETAMINOPHEN 325 MG PO TABS
650.0000 mg | ORAL_TABLET | Freq: Once | ORAL | Status: AC
  Administered 2022-06-07: 650 mg via ORAL
  Filled 2022-06-07: qty 2

## 2022-06-07 MED ORDER — DIPHENHYDRAMINE HCL 25 MG PO CAPS
25.0000 mg | ORAL_CAPSULE | Freq: Once | ORAL | Status: AC
  Administered 2022-06-07: 25 mg via ORAL
  Filled 2022-06-07: qty 1

## 2022-06-07 NOTE — Progress Notes (Addendum)
PATIENT CARE CENTER NOTE:  Provider: Madelon Lips MD  Diagnosis: anemia w/ESRD  Procedure: 1 unit PRBCs   Patient received via PIV 1 unit of packed red blood cells. T/S and CBC done before transfusion per orders. Consent for blood products valid. Pre transfusion medications PO Benadryl and Tylenol were given.  Blood transfused over 3 hours per orders for pt hx of HD. Provider's RN [place] at dialysis center notified of pt's elevated BP prior to and during transfusion, no intervention required at this time and instruct pt to take his BP meds once he gets home, pt verbalized understanding. Tolerated well, vitals stable, post transfusion H/H not required per orders. Discharge instructions given, verbalized understanding. Patient alert, oriented and ambulatory at the time of discharge.

## 2022-06-08 DIAGNOSIS — D509 Iron deficiency anemia, unspecified: Secondary | ICD-10-CM | POA: Diagnosis not present

## 2022-06-08 DIAGNOSIS — D631 Anemia in chronic kidney disease: Secondary | ICD-10-CM | POA: Diagnosis not present

## 2022-06-08 DIAGNOSIS — R52 Pain, unspecified: Secondary | ICD-10-CM | POA: Diagnosis not present

## 2022-06-08 DIAGNOSIS — N186 End stage renal disease: Secondary | ICD-10-CM | POA: Diagnosis not present

## 2022-06-08 DIAGNOSIS — T7840XA Allergy, unspecified, initial encounter: Secondary | ICD-10-CM | POA: Diagnosis not present

## 2022-06-08 DIAGNOSIS — Z992 Dependence on renal dialysis: Secondary | ICD-10-CM | POA: Diagnosis not present

## 2022-06-08 DIAGNOSIS — N2581 Secondary hyperparathyroidism of renal origin: Secondary | ICD-10-CM | POA: Diagnosis not present

## 2022-06-08 DIAGNOSIS — D689 Coagulation defect, unspecified: Secondary | ICD-10-CM | POA: Diagnosis not present

## 2022-06-08 LAB — TYPE AND SCREEN
ABO/RH(D): B POS
Antibody Screen: NEGATIVE
Unit division: 0

## 2022-06-08 LAB — BPAM RBC
Blood Product Expiration Date: 202312162359
ISSUE DATE / TIME: 202312051158
Unit Type and Rh: 7300

## 2022-06-09 ENCOUNTER — Ambulatory Visit: Payer: Medicare Other | Admitting: Medical

## 2022-06-10 DIAGNOSIS — N2581 Secondary hyperparathyroidism of renal origin: Secondary | ICD-10-CM | POA: Diagnosis not present

## 2022-06-10 DIAGNOSIS — D509 Iron deficiency anemia, unspecified: Secondary | ICD-10-CM | POA: Diagnosis not present

## 2022-06-10 DIAGNOSIS — T7840XA Allergy, unspecified, initial encounter: Secondary | ICD-10-CM | POA: Diagnosis not present

## 2022-06-10 DIAGNOSIS — N186 End stage renal disease: Secondary | ICD-10-CM | POA: Diagnosis not present

## 2022-06-10 DIAGNOSIS — Z992 Dependence on renal dialysis: Secondary | ICD-10-CM | POA: Diagnosis not present

## 2022-06-10 DIAGNOSIS — D689 Coagulation defect, unspecified: Secondary | ICD-10-CM | POA: Diagnosis not present

## 2022-06-10 DIAGNOSIS — D631 Anemia in chronic kidney disease: Secondary | ICD-10-CM | POA: Diagnosis not present

## 2022-06-10 DIAGNOSIS — R52 Pain, unspecified: Secondary | ICD-10-CM | POA: Diagnosis not present

## 2022-06-13 DIAGNOSIS — D509 Iron deficiency anemia, unspecified: Secondary | ICD-10-CM | POA: Diagnosis not present

## 2022-06-13 DIAGNOSIS — N2581 Secondary hyperparathyroidism of renal origin: Secondary | ICD-10-CM | POA: Diagnosis not present

## 2022-06-13 DIAGNOSIS — Z992 Dependence on renal dialysis: Secondary | ICD-10-CM | POA: Diagnosis not present

## 2022-06-13 DIAGNOSIS — D631 Anemia in chronic kidney disease: Secondary | ICD-10-CM | POA: Diagnosis not present

## 2022-06-13 DIAGNOSIS — T7840XA Allergy, unspecified, initial encounter: Secondary | ICD-10-CM | POA: Diagnosis not present

## 2022-06-13 DIAGNOSIS — D689 Coagulation defect, unspecified: Secondary | ICD-10-CM | POA: Diagnosis not present

## 2022-06-13 DIAGNOSIS — R52 Pain, unspecified: Secondary | ICD-10-CM | POA: Diagnosis not present

## 2022-06-13 DIAGNOSIS — N186 End stage renal disease: Secondary | ICD-10-CM | POA: Diagnosis not present

## 2022-06-15 ENCOUNTER — Encounter (HOSPITAL_BASED_OUTPATIENT_CLINIC_OR_DEPARTMENT_OTHER): Payer: Self-pay | Admitting: Urology

## 2022-06-15 DIAGNOSIS — D509 Iron deficiency anemia, unspecified: Secondary | ICD-10-CM | POA: Diagnosis not present

## 2022-06-15 DIAGNOSIS — D631 Anemia in chronic kidney disease: Secondary | ICD-10-CM | POA: Diagnosis not present

## 2022-06-15 DIAGNOSIS — N2581 Secondary hyperparathyroidism of renal origin: Secondary | ICD-10-CM | POA: Diagnosis not present

## 2022-06-15 DIAGNOSIS — N186 End stage renal disease: Secondary | ICD-10-CM | POA: Diagnosis not present

## 2022-06-15 DIAGNOSIS — R52 Pain, unspecified: Secondary | ICD-10-CM | POA: Diagnosis not present

## 2022-06-15 DIAGNOSIS — D689 Coagulation defect, unspecified: Secondary | ICD-10-CM | POA: Diagnosis not present

## 2022-06-15 DIAGNOSIS — T7840XA Allergy, unspecified, initial encounter: Secondary | ICD-10-CM | POA: Diagnosis not present

## 2022-06-15 DIAGNOSIS — Z992 Dependence on renal dialysis: Secondary | ICD-10-CM | POA: Diagnosis not present

## 2022-06-16 ENCOUNTER — Encounter (HOSPITAL_BASED_OUTPATIENT_CLINIC_OR_DEPARTMENT_OTHER): Payer: Self-pay | Admitting: Urology

## 2022-06-16 ENCOUNTER — Ambulatory Visit: Payer: Medicare Other | Admitting: Family Medicine

## 2022-06-16 DIAGNOSIS — D689 Coagulation defect, unspecified: Secondary | ICD-10-CM | POA: Diagnosis not present

## 2022-06-16 DIAGNOSIS — T7840XA Allergy, unspecified, initial encounter: Secondary | ICD-10-CM | POA: Diagnosis not present

## 2022-06-16 DIAGNOSIS — Z992 Dependence on renal dialysis: Secondary | ICD-10-CM | POA: Diagnosis not present

## 2022-06-16 DIAGNOSIS — N186 End stage renal disease: Secondary | ICD-10-CM | POA: Diagnosis not present

## 2022-06-16 DIAGNOSIS — D509 Iron deficiency anemia, unspecified: Secondary | ICD-10-CM | POA: Diagnosis not present

## 2022-06-16 DIAGNOSIS — R52 Pain, unspecified: Secondary | ICD-10-CM | POA: Diagnosis not present

## 2022-06-16 DIAGNOSIS — D631 Anemia in chronic kidney disease: Secondary | ICD-10-CM | POA: Diagnosis not present

## 2022-06-16 DIAGNOSIS — N2581 Secondary hyperparathyroidism of renal origin: Secondary | ICD-10-CM | POA: Diagnosis not present

## 2022-06-16 NOTE — Progress Notes (Signed)
Spoke w/ via phone for pre-op interview--- pt Lab needs dos---- [employer], ekg              Lab results------ no COVID test -----patient states asymptomatic no test needed Arrive at ------- 0830 on 06-17-2022 NPO after MN NO Solid Food.  Clear liquids from MN until--- 0730 Med rec completed Medications to take morning of surgery ----- toprol, allopurinol, protonix, xanax if needed Diabetic medication ----- n/a Patient instructed no nail polish to be worn day of surgery Patient instructed to bring photo id and insurance card day of surgery Patient aware to have Driver (ride ) / caregiver for 24 hours after surgery -- wife, Pamala Hurry Patient Special Instructions ----- asked to bring rescue inhaler dos Pre-Op special Istructions -----  pt told by Dr Tresa Moore office to continue ASA but will not take am dos.   Pt hemodialysis normally t/th/ s but this week was changed due to surgery.  This week was Monday / Wednesday/ and Thursday .  Note from Dr Hollie Salk with labs results from today can be seen in care everywhere  Patient verbalized understanding of instructions that were given at this phone interview. Patient denies shortness of breath, chest pain, fever, cough at this phone interview.  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  Pt denies cardiac s&s, sob,and no peripheral swelling.    PCP:  Dr Glo Herring (lov 09-23-2021 epic) Cardiologist : Dr Johney Frame [facility] 04-10-2020 epic) Vascular:  Dr Scot Dock [facility] 08-26-2020 epic) Nephrologist:  Dr Hollie Salk (notes in care everywhre from kidney center w/ labs results in note) Chest x-ray :  08-24-2021 wpic EKG :  04-10-2020  epic Echo :  04-27-2020 epic Stress test:  no Cardiac Cath : no Activity level:   Obelia Bonello sob w/ any acitivities Sleep Study/ CPAP :  Yes/ NO  Blood Thinner/  Instructions Maryjane Hurter Dose:  no ASA / Instructions/ Last Dose : ASA '81mg'$ /  per Dr Tresa Moore continue to not stop prior to surgery

## 2022-06-17 ENCOUNTER — Ambulatory Visit (HOSPITAL_BASED_OUTPATIENT_CLINIC_OR_DEPARTMENT_OTHER)
Admission: RE | Admit: 2022-06-17 | Discharge: 2022-06-17 | Disposition: A | Discharge status: Home / Self Care | Payer: Medicare Other | Attending: Urology | Admitting: Urology

## 2022-06-17 ENCOUNTER — Ambulatory Visit (HOSPITAL_BASED_OUTPATIENT_CLINIC_OR_DEPARTMENT_OTHER): Payer: Medicare Other | Admitting: Anesthesiology

## 2022-06-17 ENCOUNTER — Encounter (HOSPITAL_BASED_OUTPATIENT_CLINIC_OR_DEPARTMENT_OTHER)
Admission: RE | Disposition: A | Discharge status: Home / Self Care | Payer: Self-pay | Source: Home / Self Care | Attending: Urology

## 2022-06-17 ENCOUNTER — Other Ambulatory Visit: Payer: Self-pay

## 2022-06-17 ENCOUNTER — Encounter (HOSPITAL_BASED_OUTPATIENT_CLINIC_OR_DEPARTMENT_OTHER): Payer: Self-pay | Admitting: Urology

## 2022-06-17 DIAGNOSIS — Z992 Dependence on renal dialysis: Secondary | ICD-10-CM | POA: Insufficient documentation

## 2022-06-17 DIAGNOSIS — N135 Crossing vessel and stricture of ureter without hydronephrosis: Secondary | ICD-10-CM | POA: Diagnosis not present

## 2022-06-17 DIAGNOSIS — D631 Anemia in chronic kidney disease: Secondary | ICD-10-CM | POA: Insufficient documentation

## 2022-06-17 DIAGNOSIS — F17210 Nicotine dependence, cigarettes, uncomplicated: Secondary | ICD-10-CM

## 2022-06-17 DIAGNOSIS — I132 Hypertensive heart and chronic kidney disease with heart failure and with stage 5 chronic kidney disease, or end stage renal disease: Secondary | ICD-10-CM | POA: Diagnosis not present

## 2022-06-17 DIAGNOSIS — N138 Other obstructive and reflux uropathy: Secondary | ICD-10-CM | POA: Diagnosis not present

## 2022-06-17 DIAGNOSIS — I5030 Unspecified diastolic (congestive) heart failure: Secondary | ICD-10-CM | POA: Insufficient documentation

## 2022-06-17 DIAGNOSIS — F17200 Nicotine dependence, unspecified, uncomplicated: Secondary | ICD-10-CM | POA: Diagnosis not present

## 2022-06-17 DIAGNOSIS — G4733 Obstructive sleep apnea (adult) (pediatric): Secondary | ICD-10-CM | POA: Insufficient documentation

## 2022-06-17 DIAGNOSIS — N186 End stage renal disease: Secondary | ICD-10-CM

## 2022-06-17 DIAGNOSIS — C7951 Secondary malignant neoplasm of bone: Secondary | ICD-10-CM | POA: Insufficient documentation

## 2022-06-17 DIAGNOSIS — K219 Gastro-esophageal reflux disease without esophagitis: Secondary | ICD-10-CM | POA: Insufficient documentation

## 2022-06-17 DIAGNOSIS — I509 Heart failure, unspecified: Secondary | ICD-10-CM | POA: Diagnosis not present

## 2022-06-17 DIAGNOSIS — Z01818 Encounter for other preprocedural examination: Secondary | ICD-10-CM

## 2022-06-17 DIAGNOSIS — I130 Hypertensive heart and chronic kidney disease with heart failure and stage 1 through stage 4 chronic kidney disease, or unspecified chronic kidney disease: Secondary | ICD-10-CM | POA: Insufficient documentation

## 2022-06-17 DIAGNOSIS — C61 Malignant neoplasm of prostate: Secondary | ICD-10-CM | POA: Diagnosis present

## 2022-06-17 HISTORY — DX: Urgency of urination: R39.15

## 2022-06-17 HISTORY — PX: CYSTOSCOPY W/ URETERAL STENT PLACEMENT: SHX1429

## 2022-06-17 HISTORY — DX: 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 stage renal disease: I12.0

## 2022-06-17 HISTORY — DX: Secondary malignant neoplasm of bone: C79.51

## 2022-06-17 LAB — POCT I-STAT, CHEM 8
BUN: 28 mg/dL — ABNORMAL HIGH (ref 8–23)
Calcium, Ion: 1.21 mmol/L (ref 1.15–1.40)
Chloride: 102 mmol/L (ref 98–111)
Creatinine, Ser: 7.7 mg/dL — ABNORMAL HIGH (ref 0.61–1.24)
Glucose, Bld: 96 mg/dL (ref 70–99)
HCT: 27 % — ABNORMAL LOW (ref 39.0–52.0)
Hemoglobin: 9.2 g/dL — ABNORMAL LOW (ref 13.0–17.0)
Potassium: 4.3 mmol/L (ref 3.5–5.1)
Sodium: 144 mmol/L (ref 135–145)
TCO2: 30 mmol/L (ref 22–32)

## 2022-06-17 SURGERY — CYSTOSCOPY, WITH RETROGRADE PYELOGRAM AND URETERAL STENT INSERTION
Anesthesia: General | Site: Ureter | Laterality: Bilateral

## 2022-06-17 MED ORDER — PROPOFOL 10 MG/ML IV BOLUS
INTRAVENOUS | Status: DC | PRN
  Administered 2022-06-17: 50 mg via INTRAVENOUS
  Administered 2022-06-17: 200 mg via INTRAVENOUS

## 2022-06-17 MED ORDER — CEFTRIAXONE SODIUM 1 G IJ SOLR
INTRAMUSCULAR | Status: AC
  Filled 2022-06-17: qty 10

## 2022-06-17 MED ORDER — SODIUM CHLORIDE 0.9 % IR SOLN
Status: DC | PRN
  Administered 2022-06-17: 3000 mL via INTRAVESICAL

## 2022-06-17 MED ORDER — FENTANYL CITRATE (PF) 100 MCG/2ML IJ SOLN
INTRAMUSCULAR | Status: DC | PRN
  Administered 2022-06-17: 100 ug via INTRAVENOUS

## 2022-06-17 MED ORDER — OXYCODONE HCL 5 MG PO TABS: 5.0000 mg | ORAL_TABLET | Freq: Four times a day (QID) | ORAL | 0 refills | Status: DC | PRN

## 2022-06-17 MED ORDER — ONDANSETRON HCL 4 MG/2ML IJ SOLN
INTRAMUSCULAR | Status: DC | PRN
  Administered 2022-06-17: 4 mg via INTRAVENOUS

## 2022-06-17 MED ORDER — LACTATED RINGERS IV SOLN: INTRAVENOUS | Status: DC

## 2022-06-17 MED ORDER — DEXAMETHASONE SODIUM PHOSPHATE 4 MG/ML IJ SOLN
INTRAMUSCULAR | Status: DC | PRN
  Administered 2022-06-17: 4 mg via INTRAVENOUS

## 2022-06-17 MED ORDER — DEXMEDETOMIDINE HCL IN NACL 400 MCG/100ML IV SOLN
INTRAVENOUS | Status: DC | PRN
  Administered 2022-06-17: 8 ug via INTRAVENOUS

## 2022-06-17 MED ORDER — SODIUM CHLORIDE 0.9 % IV SOLN
INTRAVENOUS | Status: AC
  Filled 2022-06-17: qty 100

## 2022-06-17 MED ORDER — ACETAMINOPHEN 500 MG PO TABS
ORAL_TABLET | ORAL | Status: AC
  Filled 2022-06-17: qty 2

## 2022-06-17 MED ORDER — ONDANSETRON HCL 4 MG/2ML IJ SOLN: 4.0000 mg | Freq: Once | INTRAMUSCULAR | Status: DC | PRN

## 2022-06-17 MED ORDER — LIDOCAINE HCL (CARDIAC) PF 100 MG/5ML IV SOSY
PREFILLED_SYRINGE | INTRAVENOUS | Status: DC | PRN
  Administered 2022-06-17: 60 mg via INTRAVENOUS

## 2022-06-17 MED ORDER — OXYCODONE HCL 5 MG PO TABS
ORAL_TABLET | ORAL | Status: AC
  Filled 2022-06-17: qty 1

## 2022-06-17 MED ORDER — HYDROMORPHONE HCL 1 MG/ML IJ SOLN: 0.2500 mg | INTRAMUSCULAR | Status: DC | PRN

## 2022-06-17 MED ORDER — FENTANYL CITRATE (PF) 100 MCG/2ML IJ SOLN
INTRAMUSCULAR | Status: AC
  Filled 2022-06-17: qty 2

## 2022-06-17 MED ORDER — PHENYLEPHRINE 80 MCG/ML (10ML) SYRINGE FOR IV PUSH (FOR BLOOD PRESSURE SUPPORT)
PREFILLED_SYRINGE | INTRAVENOUS | Status: DC | PRN
  Administered 2022-06-17 (×2): 80 ug via INTRAVENOUS
  Administered 2022-06-17 (×2): 160 ug via INTRAVENOUS

## 2022-06-17 MED ORDER — OXYCODONE HCL 5 MG/5ML PO SOLN: 5.0000 mg | Freq: Once | ORAL | Status: AC | PRN

## 2022-06-17 MED ORDER — SODIUM CHLORIDE 0.9 % IV SOLN: INTRAVENOUS | Status: DC

## 2022-06-17 MED ORDER — SODIUM CHLORIDE 0.9 % IV SOLN
1.0000 g | INTRAVENOUS | Status: AC
  Administered 2022-06-17: 1 g via INTRAVENOUS

## 2022-06-17 MED ORDER — OXYCODONE HCL 5 MG PO TABS
5.0000 mg | ORAL_TABLET | Freq: Once | ORAL | Status: AC | PRN
  Administered 2022-06-17: 5 mg via ORAL

## 2022-06-17 MED ORDER — IOHEXOL 300 MG/ML  SOLN
INTRAMUSCULAR | Status: DC | PRN
  Administered 2022-06-17: 13 mL

## 2022-06-17 MED ORDER — ACETAMINOPHEN 500 MG PO TABS
1000.0000 mg | ORAL_TABLET | Freq: Once | ORAL | Status: AC
  Administered 2022-06-17: 1000 mg via ORAL

## 2022-06-17 SURGICAL SUPPLY — 24 items
BAG DRAIN URO-CYSTO SKYTR STRL (DRAIN) ×1 IMPLANT
BAG DRN UROCATH (DRAIN) ×1
BASKET LASER NITINOL 1.9FR (BASKET) IMPLANT
BSKT STON RTRVL 120 1.9FR (BASKET)
CATH URETL OPEN END 6FR 70 (CATHETERS) IMPLANT
CLOTH BEACON ORANGE TIMEOUT ST (SAFETY) ×1 IMPLANT
FIBER LASER FLEXIVA 365 (UROLOGICAL SUPPLIES) IMPLANT
GLOVE BIO SURGEON STRL SZ7.5 (GLOVE) ×1 IMPLANT
GLOVE BIOGEL PI IND STRL 6.5 (GLOVE) IMPLANT
GLOVE ECLIPSE 6.0 STRL STRAW (GLOVE) IMPLANT
GOWN STRL REUS W/TWL LRG LVL3 (GOWN DISPOSABLE) ×1 IMPLANT
GUIDEWIRE ANG ZIPWIRE 038X150 (WIRE) ×1 IMPLANT
GUIDEWIRE STR DUAL SENSOR (WIRE) ×1 IMPLANT
IV NS IRRIG 3000ML ARTHROMATIC (IV SOLUTION) ×1 IMPLANT
KIT TURNOVER CYSTO (KITS) ×1 IMPLANT
MANIFOLD NEPTUNE II (INSTRUMENTS) ×1 IMPLANT
PACK CYSTO (CUSTOM PROCEDURE TRAY) ×1 IMPLANT
STENT CONTOUR 7FRX26 (STENTS) IMPLANT
SYR 10ML LL (SYRINGE) ×1 IMPLANT
TRACTIP FLEXIVA PULS ID 200XHI (Laser) IMPLANT
TRACTIP FLEXIVA PULSE ID 200 (Laser)
TUBE CONNECTING 12X1/4 (SUCTIONS) ×1 IMPLANT
TUBE FEEDING 8FR 16IN STR KANG (MISCELLANEOUS) IMPLANT
TUBING UROLOGY SET (TUBING) ×1 IMPLANT

## 2022-06-17 NOTE — Discharge Instructions (Addendum)
1 - You may have urinary urgency (bladder spasms) and bloody urine on / off with stent in place. This is normal.  2 - Call MD or go to ER for fever >102, severe pain / nausea / vomiting not relieved by medications, or acute change in medical status   Post Anesthesia Home Care Instructions  Activity: Get plenty of rest for the remainder of the day. A responsible individual must stay with you for 24 hours following the procedure.  For the next 24 hours, DO NOT: -Drive a car -Paediatric nurse -Drink alcoholic beverages -Take any medication unless instructed by your physician -Make any legal decisions or sign important papers.  Meals: Start with liquid foods such as gelatin or soup. Progress to regular foods as tolerated. Avoid greasy, spicy, heavy foods. If nausea and/or vomiting occur, drink only clear liquids until the nausea and/or vomiting subsides. Call your physician if vomiting continues.  Special Instructions/Symptoms: Your throat may feel dry or sore from the anesthesia or the breathing tube placed in your throat during surgery. If this causes discomfort, gargle with warm salt water. The discomfort should disappear within 24 hours.  [facility] [phone] Post Ureteroscopy With Stent Instructions  Definitions:  Ureter: The duct that transports urine from the kidney to the bladder. Stent:   A plastic hollow tube that is placed into the ureter, from the kidney to the bladder to prevent the ureter from swelling shut.  GENERAL INSTRUCTIONS:  Despite the fact that no skin incisions were used, the area around the ureter and bladder is raw and irritated. The stent is a foreign body which will further irritate the bladder wall. This irritation is manifested by increased frequency of urination, both day and night, and by an increase in the urge to urinate. In some, the urge to urinate is present almost always. Sometimes the urge is strong enough that you may not be  able to stop yourself from urinating. The only real cure is to remove the stent and then give time for the bladder wall to heal which can't be done until the danger of the ureter swelling shut has passed, which varies.  You may see some blood in your urine while the stent is in place and a few days afterwards. Do not be alarmed, even if the urine was clear for a while. Get off your feet and drink lots of fluids until clearing occurs. If you start to pass clots or don't improve, call us.  DIET: You may return to your normal diet immediately. Because of the raw surface of your bladder, alcohol, spicy foods, acid type foods and drinks with caffeine may cause irritation or frequency and should be used in moderation. To keep your urine flowing freely and to avoid constipation, drink plenty of fluids during the day ( 8-10 glasses ). Tip: Avoid cranberry juice because it is very acidic.  ACTIVITY: Your physical activity doesn't need to be restricted. However, if you are very active, you may see some blood in your urine. We suggest that you reduce your activity under these circumstances until the bleeding has stopped.  BOWELS: It is important to keep your bowels regular during the postoperative period. Straining with bowel movements can cause bleeding. A bowel movement every other day is reasonable. Use a mild laxative if needed, such as Milk of Magnesia 2-3 tablespoons, or 2 Dulcolax tablets. Call if you continue to have problems. If you have been taking narcotics for pain, before, during or after your surgery,  you may be constipated. Take a laxative if necessary.   MEDICATION: You should resume your pre-surgery medications unless told not to. In addition you will often be given an antibiotic to prevent infection. These should be taken as prescribed until the bottles are finished unless you are having an unusual reaction to one of the drugs.  PROBLEMS YOU SHOULD REPORT TO [place]: Fevers over 100.5  Fahrenheit. Heavy bleeding, or clots ( See above notes about blood in urine ). Inability to urinate. Drug reactions ( hives, rash, nausea, vomiting, diarrhea ). Severe burning or pain with urination that is not improving.  FOLLOW-UP: You will need a follow-up appointment to monitor your progress. Call for this appointment at the number listed above. Usually the first appointment will be about three to fourteen days after your surgery.

## 2022-06-17 NOTE — Op Note (Unsigned)
NAME: Devon Cook, GORKA MEDICAL RECORD NO: [id number] ACCOUNT NO: [id number] DATE OF BIRTH: February 07, 1961 FACILITY: [facility] [facility] LOCATION: [facility] PHYSICIAN: Alexis Frock, MD  Operative Report   DATE OF PROCEDURE: 06/17/2022  PREOPERATIVE DIAGNOSES:  Bilateral ureteral malignant obstruction, advanced prostate cancer.  POSTOPERATIVE DIAGNOSES:  Bilateral ureteral malignant obstruction, advanced prostate cancer.  PROCEDURE PERFORMED:  1.  Cystoscopy with bilateral retrograde pyelograms, interpretation. 2.  Bilateral ureteral stent exchange.  ESTIMATED BLOOD LOSS:  Nil.  COMPLICATIONS:  None.  SPECIMEN:  Bilateral ureteral stents for discard, minimally encrusted.  FINDINGS:  1.  Multilevel pendulous moderate urethral stricture.  This was just dilated with the cystoscope. 2.  Minimal encrustation of in situ stents. 3.  Minimal hydronephrosis with retrograde pyelograms.  INDICATIONS:  The patient is a very pleasant but unfortunate 61 year old man with history of metastatic prostate cancer that was diagnosed de Novo metastatic several years ago at a time of acute renal failure.  He underwent stenting at that time, which  did ameliorate his renal functional decline for several years. However, he has since progressed to end-stage renal disease.  He does still make some urine, which does help with his volume status.  Therefore, we continued on ureteral stent exchange  approximately every yearly and it has been approximately a year since his most recent exchange.  He is doing well from his prostate cancer.  Informed consent was obtained and placed in medical record.  PROCEDURE IN DETAIL:  The patient being verified, procedure being cystoscopy, bilateral ureteral stent exchange was confirmed.  Procedure timeout was performed.  Intravenous antibiotics were administered.  General anesthesia was induced.  The patient was  placed into a low lithotomy position.  Sterile field was created, prepped and  draped the patient's penis, perineum, and proximal thighs using iodine.  The patient is uncircumcised.  Cystourethroscopy was performed using 21-French rigid cystoscope with  offset lens.  Inspection of anterior and posterior urethra did reveal some multifocal pendulous stricture disease, estimated approximately 10-French to 12-French pre-scope dilation and 21-French post-dilation. Urinary bladder was unremarkable.  There was  improved mass effect in the area of his trigone from known prostate cancer. Distal end of bilateral ureteral stents were seen in situ.  There was minimal encrustation.  Distal end of the right stent was grasped, brought to the level of the urethral  meatus.  A 0.038 sensor wire was advanced to the level of the kidney, stent was exchanged for open-ended catheter and right retrograde pyelogram was obtained.  Right retrograde pyelogram demonstrated single right ureter, single system right kidney.  There was no hydronephrosis noted.  Sensor wire was once again advanced and a new 7 x 26 contour type stent was placed using fluoroscopic guidance.  Good proximal  and distal planes were noted.  Next, the distal end of the left stent was grasped, brought to the level of the urethral meatus and a 0.038 sensor wire was advanced to the level of the kidney, exchanged for open-ended catheter and left retrograde  pyelogram was obtained.  Left retrograde pyelogram demonstrated single left ureter, somewhat bifid left kidney, no hydronephrosis noted.  Sensor wire once again advanced and a new 7 x 26 contour type stent was placed over the working wire using fluoroscopic guidance.  Bladder  was emptied per cystoscope.  Distal ends of stents were in a stable position.  Procedure was terminated.  The patient tolerated procedure well, no immediate perioperative complications.  The patient was taken to postanesthesia care in stable condition.  Plan for discharge home.   SHW D: 06/17/2022 11:26:00 am T:  06/17/2022 11:03:00 pm  JOB: [id number]/ [id number]

## 2022-06-17 NOTE — H&P (Signed)
Devon Cook is an 61 y.o. male.    Chief Complaint: Pre-OP BILATERAL Ureteral Stent Exchange  HPI:   1 - Prostate Cancer-  Initial Diagnosis - Gl9 metastatic cancer 12/2017 on eval PSA 787.  Primary Therapy - androgen deprivation.  Prior Treatments - androgen deprivation.  Current Treatment - androgen deprivation + Xtandi 160  Most Recent Restaging - 02/2022 CT, BS mostly quiescent sterum / rib mets, some spine as well.   Recent Course -  09/2020 - PSA <.06, T10 ==> Eligard 45 ; 12/2020 PSA <.06, T <3; 04/2021 (labs pending) ==> Eligard 45; 06/2021 - bilateral stent change 7x26 (1 year intervals)  07/2021 - PA <.01, T17; 11/2021 ==> Eligard 45, remain Xtandi '40mg'$ ; 02/2022 PSA <.04, T 17 ==> remain Xtandi; 05/2022 PSA <.04 ==> Eligard 45, remain Xtandi   2 - Bone Health / Bone Mets - L3 spine mets w/o compression  Prior DEXA - 04/2019 T -0.4 (normal)  Most Recent labs - 04/2019 Ca normal, Vit D 29 (low)  Current Treatment - managed through nephrology   3 - Malignant Ureteral Obstruction - Cr 17 by PCP labs on eval malaize 11/2017. Non-con CT with hydro to level of thickened bladder. Bilateral neph tubes placed 11/2017 and converted to bilateral 7x26 stents 12/2017 in OR and then as per above. Now established with [place] [facility] with GFR 15 range, requires aranesp and PRN transfusion.   4- End Stage Renal Disease - Cr 4-8's after bilateral renal decompression c/w permanent renal damage. Hemodialysis initiated 2020 with Dr. Lorrene Reid at [facility]. Has Left AVF that is maturing. May consider PD.    PMH sig for pre-DM, obesity, L spine arthritis. NO isschemic CV disease / blood thinners.   Today "Devon Cook" is seen to proceed with bilateral ureteral sten exchange. No interval fevers.   Past Medical History:  Diagnosis Date   Anemia, chronic renal failure    followed by dr Hollie Salk--- dx 05/ 2019---  treated with Iron infusions and procrit;   and blood transfusions   Bilateral hydronephrosis  urologist-- dr Tresa Moore   malignant-- chronic;  treated with ureteral stents   Chronic gastritis    07-17-2019 and duodenitis   ED (erectile dysfunction)    Edema of both lower extremities    occ le edema wears compresssion hose daily   End stage renal failure on dialysis [facility])    nephrologist-- dr Hollie Salk (@ [facility])-- secondary to obstructive uropathy secon  dary to prostate cancer hemodialysis mon/wed/fri @ [facility]---started first week of Nov 2020   GERD (gastroesophageal reflux disease)    Gout 2019   Herpes genitalis in men    History of adenomatous polyp of colon    History of hepatitis C 2017   dx 11/ 207-- treated with Harvoni (in epic 04/ 2019 lab result non-detectable)   History of lower GI bleeding 11/2017   Hypertension    followed by pcp   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 stage renal disease ([place])    Metastatic cancer to bone ([place])    secondary to primary prostate cancer  w/ progressive pain   OSA (obstructive sleep apnea)    severe osa w/ nocturnal hyoxemia per study 06-19-2019,  pt given cpap ( however on 08-30-2019 per pt having issues with mask fitting and on a new one so he not using the cpap);   (03-20-2020 still not using cpap, no mask)   Palpitations  isolated event at ED 10-05-2019 Aflutter/ Afib versus SVT ;   refer to pt's cardiologist note w/ Dr Johney Frame   Prostate 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 [facility]) urologist-- dr Tresa Moore   dx 06/ 2019-- advanced w/ mets to bone and pelvic lymphadeopathy   S/P arteriovenous (AV) fistula creation 03/2019   followed  by dr Scot Dock (vascular)  03-15-2019  by dr Carlis Abbott, left branhiocephallic;  last intervention balloon angioplasty 09-04-2020   Secondary hyperparathyroidism of renal origin [facility])    Urgency of urination    Wears glasses     Past Surgical History:  Procedure Laterality Date   A/V FISTULAGRAM Left 09/04/2020   Procedure: A/V FISTULAGRAM;   Surgeon: Angelia Mould, MD;  Location: [place] [facility];  Service: Cardiovascular;  Laterality: Left;   AV FISTULA PLACEMENT Left 03/15/2019   Procedure: ARTERIOVENOUS (AV) FISTULA CREATION LEFT ARM;  Surgeon: Marty Heck, MD;  Location: [place];  Service: Vascular;  Laterality: Left;   COLONOSCOPY     CYSTOSCOPY W/ URETERAL STENT PLACEMENT Bilateral 12/08/2017   Procedure: CYSTOSCOPY WITH RETROGRADE PYELOGRAM/URETERAL STENT PLACEMENT;  Surgeon: Alexis Frock, MD;  Location: [facility];  Service: Urology;  Laterality: Bilateral;   CYSTOSCOPY W/ URETERAL STENT PLACEMENT Bilateral 04/20/2018   Procedure: CYSTOSCOPY WITH STENT REPLACEMENT;  Surgeon: Alexis Frock, MD;  Location: [facility];  Service: Urology;  Laterality: Bilateral;   CYSTOSCOPY W/ URETERAL STENT PLACEMENT Bilateral 08/10/2018   Procedure: CYSTOSCOPY WITH STENT REPLACEMENT, BILATERAL RETROGRADES;  Surgeon: Alexis Frock, MD;  Location: [facility];  Service: Urology;  Laterality: Bilateral;  45 MINS   CYSTOSCOPY W/ URETERAL STENT PLACEMENT Bilateral 01/18/2019   Procedure: CYSTOSCOPY WITH RETROGRADE PYELOGRAM/URETERAL STENT PLACEMENT;  Surgeon: Alexis Frock, MD;  Location: [facility];  Service: Urology;  Laterality: Bilateral;   CYSTOSCOPY W/ URETERAL STENT PLACEMENT Bilateral 05/24/2019   Procedure: CYSTOSCOPY WITH RETROGRADE PYELOGRAM/BILATERAL URETERAL STENT EXCHANGE;  Surgeon: Alexis Frock, MD;  Location: [facility];  Service: Urology;  Laterality: Bilateral;   CYSTOSCOPY W/ URETERAL STENT PLACEMENT Bilateral 09/06/2019   Procedure: CYSTOSCOPY WITH RETROGRADE PYELOGRAM/URETERAL STENT EXCHANGE;  Surgeon: Alexis Frock, MD;  Location: [facility];  Service: Urology;  Laterality: Bilateral;  45 MINS   CYSTOSCOPY W/ URETERAL STENT PLACEMENT Bilateral 05/08/2020   Procedure: CYSTOSCOPY WITH RETROGRADE PYELOGRAM/URETERAL STENT REPLACEMENT;   Surgeon: Alexis Frock, MD;  Location: [facility];  Service: Urology;  Laterality: Bilateral;   CYSTOSCOPY W/ URETERAL STENT PLACEMENT Bilateral 06/04/2021   Procedure: CYSTOSCOPY WITH RETROGRADE PYELOGRAM/URETERAL STENT EXCHANGE;  Surgeon: Alexis Frock, MD;  Location: [facility];  Service: Urology;  Laterality: Bilateral;  45 MINS   INSERTION OF DIALYSIS CATHETER Right 05/06/2019   Procedure: INSERTION OF TUNNELED  DIALYSIS CATHETER;  Surgeon: Rosetta Posner, MD;  Location: [place];  Service: Vascular;  Laterality: Right;   IR NEPHROSTOMY PLACEMENT LEFT  11/15/2017   IR NEPHROSTOMY PLACEMENT RIGHT  11/15/2017   PERIPHERAL VASCULAR INTERVENTION Left 09/04/2020   Procedure: PERIPHERAL VASCULAR INTERVENTION;  Surgeon: Angelia Mould, MD;  Location: [place] [facility];  Service: Cardiovascular;  Laterality: Left;   PROSTATE BIOPSY N/A 12/08/2017   Procedure: BIOPSY TRANSRECTAL ULTRASONIC PROSTATE (TUBP), PERINEAL BIOPSY;  Surgeon: Alexis Frock, MD;  Location[facility];  Service: Urology;  Laterality: N/A;   TRANSURETHRAL RESECTION OF BLADDER TUMOR N/A 12/08/2017   Procedure: TRANSURETHRAL RESECTION OF BLADDER TUMOR (TURBT);  Surgeon: Alexis Frock, MD;  Location[facility];  Service: Urology;  Laterality: N/A;   TRICEPS TENDON REPAIR Bilateral 08/2005   UPPER GASTROINTESTINAL ENDOSCOPY      Family History  Problem Relation Age of Onset   Hypertension Mother    Heart disease Mother    Diabetes Sister    Diabetes Brother    Prostate cancer Maternal Grandfather    Diabetes Sister    Prostate cancer Maternal Uncle    Colon polyps Neg Hx    Esophageal cancer Neg Hx    Stomach cancer Neg Hx    Rectal cancer Neg Hx    Social History:  reports that he has been smoking cigarettes, pipe, and cigars. He has a 3.00 pack-year smoking history. He has never used smokeless tobacco. He reports current alcohol use. He reports that he does not use drugs.  Allergies: No  Known Allergies  No medications prior to admission.    No results found for this or any previous visit (from the past 48 hour(s)). No results found.  Review of Systems  Constitutional:  Negative for chills and fever.  All other systems reviewed and are negative.   Height '6\' 2"'$  (1.88 m), weight 128.8 kg. Physical Exam Vitals reviewed.  Constitutional:      Comments: At baseline  HENT:     Head: Normocephalic.  Eyes:     Pupils: Pupils are equal, round, and reactive to light.  Cardiovascular:     Rate and Rhythm: Normal rate.  Abdominal:     General: Abdomen is flat.     Comments: Stable truncal obesity  Genitourinary:    Comments: No CVAT Musculoskeletal:     Comments: AVF with thrill  Neurological:     General: No focal deficit present.     Mental Status: He is alert.  Psychiatric:        Mood and Affect: Mood normal.      Assessment/Plan  Proceed as planned with BILATERAL ureteral stent exchange as he does still make some urine which helps with volume status. Risks, benefits, alternatives, expected peri-op course discussed previously and reiterated today.   Alexis Frock, MD 06/17/2022, 7:47 AM

## 2022-06-17 NOTE — Anesthesia Preprocedure Evaluation (Addendum)
Anesthesia Evaluation  Patient identified by MRN, date of birth, ID band Patient awake    Reviewed: Allergy & Precautions, NPO status , Patient's Chart, lab work & pertinent test results, reviewed documented beta blocker date and time   Airway Mallampati: IV  TM Distance: >3 FB Neck ROM: Full    Dental  (+) Teeth Intact, Dental Advisory Given   Pulmonary sleep apnea , Current Smoker and Patient abstained from smoking. Noncompliant w/ CPAP   Pulmonary exam normal breath sounds clear to auscultation       Cardiovascular hypertension, Pt. on medications and Pt. on home beta blockers pulmonary hypertension (mod pHTN)+CHF (LVEF 36-14%, grade 1 diastolic dysfunction)  Normal cardiovascular exam+ Valvular Problems/Murmurs (mild MR) MR  Rhythm:Regular Rate:Normal  Echo 2021  1. Left ventricular ejection fraction, by estimation, is 45 to 50%. The left ventricle has mildly decreased function. The left ventricle demonstrates global hypokinesis. The left ventricular internal cavity size was moderately dilated. There is mild concentric left ventricular hypertrophy. Left ventricular diastolic parameters are consistent with Grade I diastolic dysfunction (impaired relaxation). Elevated left atrial pressure.  2. Right ventricular systolic function is normal. The right ventricular size is normal. There is moderately elevated pulmonary artery systolic pressure. The estimated right ventricular systolic pressure is 43.1 mmHg.  3. Left atrial size was moderately dilated.  4. Right atrial size was mildly dilated.  5. The mitral valve is normal in structure. Mild mitral valve regurgitation. No evidence of mitral stenosis.  6. The aortic valve is normal in structure. Aortic valve regurgitation is not visualized. No aortic stenosis is present.  7. Aortic dilatation noted. There is mild dilatation of the ascending aorta, measuring 42 mm.  8. The inferior  vena cava is dilated in size with <50% respiratory variability, suggesting right atrial pressure of 15 mmHg.     Neuro/Psych negative neurological ROS  negative psych ROS   GI/Hepatic ,GERD  Medicated and Controlled,,(+)       alcohol use, Hepatitis - (treated 2017), CHx GIB   Endo/Other  negative endocrine ROS    Renal/GU ESRF and DialysisRenal diseaseLast HD yesterday, K 4 this AM   Prostate ca w/ mets to bone     Musculoskeletal negative musculoskeletal ROS (+)    Abdominal   Peds  Hematology  (+) Blood dyscrasia, anemia Hb 7.6 on 06/07/22- recent blood transfusion's last one 06-07-2022 1 unit PRBC.  Hb up to 9.6 this AM   Anesthesia Other Findings   Reproductive/Obstetrics negative OB ROS                              Anesthesia Physical Anesthesia Plan  ASA: 3  Anesthesia Plan: General   Post-op Pain Management: Tylenol PO (pre-op)*   Induction: Intravenous  PONV Risk Score and Plan: 2 and Ondansetron, Dexamethasone, Midazolam and Treatment may vary due to age or medical condition  Airway Management Planned: LMA  Additional Equipment: None  Intra-op Plan:   Post-operative Plan: Extubation in OR  Informed Consent: I have reviewed the patients History and Physical, chart, labs and discussed the procedure including the risks, benefits and alternatives for the proposed anesthesia with the patient or authorized representative who has indicated his/her understanding and acceptance.     Dental advisory given  Plan Discussed with: CRNA  Anesthesia Plan Comments: (Nonreassuring airway exam, only has had LMAs in the past records. Last cysto 06/2021 LMA #5 without issue)  Anesthesia Quick Evaluation  

## 2022-06-17 NOTE — Transfer of Care (Signed)
Immediate Anesthesia Transfer of Care Note  Patient: Devon Cook  Procedure(s) Performed: CYSTOSCOPY WITH RETROGRADE PYELOGRAM/URETERAL STENT EXCHANGE (Bilateral: Ureter)  Patient Location: PACU  Anesthesia Type:General  Level of Consciousness: awake, alert , oriented, and patient cooperative  Airway & Oxygen Therapy: Patient Spontanous Breathing and Patient connected to face mask oxygen  Post-op Assessment: Report given to RN and Post -op Vital signs reviewed and stable  Post vital signs: Reviewed and stable  Last Vitals:  Vitals Value Taken Time  BP 134/78 06/17/22 1134  Temp 36.6 C 06/17/22 1134  Pulse 66 06/17/22 1136  Resp 18 06/17/22 1136  SpO2 100 % 06/17/22 1136  Vitals shown include unvalidated device data.  Last Pain:  Vitals:   06/17/22 0915  TempSrc: Oral  PainSc: 0-No pain      Patients Stated Pain Goal: 3 (76/19/50 9326)  Complications: No notable events documented.

## 2022-06-17 NOTE — Anesthesia Procedure Notes (Signed)
Procedure Name: LMA Insertion Date/Time: 06/17/2022 11:01 AM  Performed by: Georgeanne Nim, CRNAPre-anesthesia Checklist: Patient identified, Patient being monitored, Emergency Drugs available, Timeout performed and Suction available Patient Re-evaluated:Patient Re-evaluated prior to induction Oxygen Delivery Method: Circle System Utilized Preoxygenation: Pre-oxygenation with 100% oxygen Induction Type: IV induction Ventilation: Mask ventilation without difficulty LMA: LMA inserted LMA Size: 5.0 Number of attempts: 1 Placement Confirmation: positive ETCO2 and breath sounds checked- equal and bilateral

## 2022-06-17 NOTE — Anesthesia Postprocedure Evaluation (Signed)
Anesthesia Post Note  Patient: Devon Cook  Procedure(s) Performed: CYSTOSCOPY WITH RETROGRADE PYELOGRAM/URETERAL STENT EXCHANGE (Bilateral: Ureter)     Patient location during evaluation: PACU Anesthesia Type: General Level of consciousness: awake and alert, oriented and patient cooperative Pain management: pain level controlled Vital Signs Assessment: post-procedure vital signs reviewed and stable Respiratory status: spontaneous breathing, nonlabored ventilation and respiratory function stable Cardiovascular status: blood pressure returned to baseline and stable Postop Assessment: no apparent nausea or vomiting Anesthetic complications: no   No notable events documented.  Last Vitals:  Vitals:   06/17/22 1200 06/17/22 1230  BP: (!) 149/108 (!) 177/101  Pulse: 63 73  Resp: 12 16  Temp:  36.7 C  SpO2: 96% 100%    Last Pain:  Vitals:   06/17/22 1230  TempSrc:   PainSc: [place]

## 2022-06-17 NOTE — Brief Op Note (Signed)
06/17/2022  11:21 AM  PATIENT:  Devon Cook  61 y.o. male  PRE-OPERATIVE DIAGNOSIS:  BILATERAL URETERAL OBSTRUCTION  POST-OPERATIVE DIAGNOSIS:  BILATERAL URETERAL OBSTRUCTION  PROCEDURE:  Procedure(s): CYSTOSCOPY WITH RETROGRADE PYELOGRAM/URETERAL STENT EXCHANGE (Bilateral)  SURGEON:  Surgeon(s) and Role:    * Alexis Frock, MD - Primary  PHYSICIAN ASSISTANT:   ASSISTANTS: none   ANESTHESIA:   general  EBL:  minimal   BLOOD ADMINISTERED:none  DRAINS: none   LOCAL MEDICATIONS USED:  NONE  SPECIMEN:  Source of Specimen:  bilateral ureteral stents  DISPOSITION OF SPECIMEN:   discard  COUNTS:  YES  TOURNIQUET:  * No tourniquets in log *  DICTATION: .Other Dictation: Dictation Number [id number]  PLAN OF CARE: Discharge to home after PACU  PATIENT DISPOSITION:  PACU - hemodynamically stable.   Delay start of Pharmacological VTE agent (>24hrs) due to surgical blood loss or risk of bleeding: yes

## 2022-06-20 DIAGNOSIS — Z992 Dependence on renal dialysis: Secondary | ICD-10-CM | POA: Diagnosis not present

## 2022-06-20 DIAGNOSIS — D689 Coagulation defect, unspecified: Secondary | ICD-10-CM | POA: Diagnosis not present

## 2022-06-20 DIAGNOSIS — R52 Pain, unspecified: Secondary | ICD-10-CM | POA: Diagnosis not present

## 2022-06-20 DIAGNOSIS — D631 Anemia in chronic kidney disease: Secondary | ICD-10-CM | POA: Diagnosis not present

## 2022-06-20 DIAGNOSIS — T7840XA Allergy, unspecified, initial encounter: Secondary | ICD-10-CM | POA: Diagnosis not present

## 2022-06-20 DIAGNOSIS — N2581 Secondary hyperparathyroidism of renal origin: Secondary | ICD-10-CM | POA: Diagnosis not present

## 2022-06-20 DIAGNOSIS — D509 Iron deficiency anemia, unspecified: Secondary | ICD-10-CM | POA: Diagnosis not present

## 2022-06-20 DIAGNOSIS — N186 End stage renal disease: Secondary | ICD-10-CM | POA: Diagnosis not present

## 2022-06-21 ENCOUNTER — Encounter (HOSPITAL_BASED_OUTPATIENT_CLINIC_OR_DEPARTMENT_OTHER): Payer: Self-pay | Admitting: Urology

## 2022-06-22 DIAGNOSIS — T7840XA Allergy, unspecified, initial encounter: Secondary | ICD-10-CM | POA: Diagnosis not present

## 2022-06-22 DIAGNOSIS — Z992 Dependence on renal dialysis: Secondary | ICD-10-CM | POA: Diagnosis not present

## 2022-06-22 DIAGNOSIS — R52 Pain, unspecified: Secondary | ICD-10-CM | POA: Diagnosis not present

## 2022-06-22 DIAGNOSIS — D689 Coagulation defect, unspecified: Secondary | ICD-10-CM | POA: Diagnosis not present

## 2022-06-22 DIAGNOSIS — N186 End stage renal disease: Secondary | ICD-10-CM | POA: Diagnosis not present

## 2022-06-22 DIAGNOSIS — N2581 Secondary hyperparathyroidism of renal origin: Secondary | ICD-10-CM | POA: Diagnosis not present

## 2022-06-22 DIAGNOSIS — D631 Anemia in chronic kidney disease: Secondary | ICD-10-CM | POA: Diagnosis not present

## 2022-06-22 DIAGNOSIS — D509 Iron deficiency anemia, unspecified: Secondary | ICD-10-CM | POA: Diagnosis not present

## 2022-06-24 DIAGNOSIS — D631 Anemia in chronic kidney disease: Secondary | ICD-10-CM | POA: Diagnosis not present

## 2022-06-24 DIAGNOSIS — R52 Pain, unspecified: Secondary | ICD-10-CM | POA: Diagnosis not present

## 2022-06-24 DIAGNOSIS — N2581 Secondary hyperparathyroidism of renal origin: Secondary | ICD-10-CM | POA: Diagnosis not present

## 2022-06-24 DIAGNOSIS — T7840XA Allergy, unspecified, initial encounter: Secondary | ICD-10-CM | POA: Diagnosis not present

## 2022-06-24 DIAGNOSIS — Z992 Dependence on renal dialysis: Secondary | ICD-10-CM | POA: Diagnosis not present

## 2022-06-24 DIAGNOSIS — N186 End stage renal disease: Secondary | ICD-10-CM | POA: Diagnosis not present

## 2022-06-24 DIAGNOSIS — D509 Iron deficiency anemia, unspecified: Secondary | ICD-10-CM | POA: Diagnosis not present

## 2022-06-24 DIAGNOSIS — D689 Coagulation defect, unspecified: Secondary | ICD-10-CM | POA: Diagnosis not present

## 2022-06-26 DIAGNOSIS — N2581 Secondary hyperparathyroidism of renal origin: Secondary | ICD-10-CM | POA: Diagnosis not present

## 2022-06-26 DIAGNOSIS — T7840XA Allergy, unspecified, initial encounter: Secondary | ICD-10-CM | POA: Diagnosis not present

## 2022-06-26 DIAGNOSIS — N186 End stage renal disease: Secondary | ICD-10-CM | POA: Diagnosis not present

## 2022-06-26 DIAGNOSIS — Z992 Dependence on renal dialysis: Secondary | ICD-10-CM | POA: Diagnosis not present

## 2022-06-26 DIAGNOSIS — D631 Anemia in chronic kidney disease: Secondary | ICD-10-CM | POA: Diagnosis not present

## 2022-06-26 DIAGNOSIS — R52 Pain, unspecified: Secondary | ICD-10-CM | POA: Diagnosis not present

## 2022-06-26 DIAGNOSIS — D689 Coagulation defect, unspecified: Secondary | ICD-10-CM | POA: Diagnosis not present

## 2022-06-26 DIAGNOSIS — D509 Iron deficiency anemia, unspecified: Secondary | ICD-10-CM | POA: Diagnosis not present

## 2022-06-28 ENCOUNTER — Ambulatory Visit (INDEPENDENT_AMBULATORY_CARE_PROVIDER_SITE_OTHER): Payer: Medicare Other | Admitting: Family Medicine

## 2022-06-28 ENCOUNTER — Encounter: Payer: Self-pay | Admitting: Family Medicine

## 2022-06-28 ENCOUNTER — Encounter (HOSPITAL_COMMUNITY): Payer: Self-pay

## 2022-06-28 VITALS — BP 138/90 | HR 77 | Wt 297.4 lb

## 2022-06-28 DIAGNOSIS — C61 Malignant neoplasm of prostate: Secondary | ICD-10-CM | POA: Diagnosis not present

## 2022-06-28 DIAGNOSIS — Z23 Encounter for immunization: Secondary | ICD-10-CM

## 2022-06-28 DIAGNOSIS — D649 Anemia, unspecified: Secondary | ICD-10-CM

## 2022-06-28 DIAGNOSIS — N186 End stage renal disease: Secondary | ICD-10-CM | POA: Diagnosis not present

## 2022-06-28 LAB — CBC WITH DIFFERENTIAL/PLATELET
Basophils Absolute: 0 10*3/uL (ref 0.0–0.2)
Basos: 0 %
EOS (ABSOLUTE): 0.5 10*3/uL — ABNORMAL HIGH (ref 0.0–0.4)
Eos: 4 %
Hematocrit: 22 % — ABNORMAL LOW (ref 37.5–51.0)
Hemoglobin: 7.5 g/dL — ABNORMAL LOW (ref 13.0–17.7)
Lymphocytes Absolute: 1.6 10*3/uL (ref 0.7–3.1)
Lymphs: 14 %
MCH: 28.2 pg (ref 26.6–33.0)
MCHC: 34.1 g/dL (ref 31.5–35.7)
MCV: 83 fL (ref 79–97)
Monocytes Absolute: 1.6 10*3/uL — ABNORMAL HIGH (ref 0.1–0.9)
Monocytes: 13 %
Neutrophils Absolute: 8.3 10*3/uL — ABNORMAL HIGH (ref 1.4–7.0)
Neutrophils: 69 %
Platelets: 241 10*3/uL (ref 150–450)
RBC: 2.66 x10E6/uL — CL (ref 4.14–5.80)
RDW: 14.7 % (ref 11.6–15.4)
WBC: 12.1 10*3/uL — ABNORMAL HIGH (ref 3.4–10.8)

## 2022-06-28 NOTE — Progress Notes (Signed)
   Subjective:    Patient ID: Devon Cook, male    DOB: 08/19/1960, 61 y.o.   MRN: [id number]  HPI He is here for consult concerning multiple issues.  He had recent blood work done which did show a hemoglobin of 9.2 after he had a transfusion however he states that he went to dialysis 2 days later and was told his hemoglobin was 7.4.  He would like follow-up blood work to determine exactly what it is.  Also he has not had a PSA and follow-up concerning his prostate cancer in several months and would like follow-up study done.  He does state that he has occasional wheezing but mainly notes that at night.  He cannot state whether when he notes the wheezing in regard to his shortness of breath, when his last dialysis was etc.   Review of Systems     Objective:   Physical Exam Alert and in no distress.  Cardiac exam shows regular rhythm without murmurs or gallops.  Lungs are clear to auscultation.       Assessment & Plan:   Anemia, unspecified type - Plan: CBC with Differential/Platelet  Malignant neoplasm of prostate ([place]) - Plan: PSA  End stage renal disease ([place]) - Plan: CBC with Differential/Platelet  Need for influenza vaccination - Plan: Flu Vaccine QUAD 6+ mos PF IM (Fluarix Quad PF) I will draw a stat CBC to determine his level of anemia.  Will also follow-up on his PSA.  Explained that wheezing can come from lots of different sources and at the present time I see no major issues going on.  He was comfortable with all that.

## 2022-06-29 DIAGNOSIS — R52 Pain, unspecified: Secondary | ICD-10-CM | POA: Diagnosis not present

## 2022-06-29 DIAGNOSIS — D631 Anemia in chronic kidney disease: Secondary | ICD-10-CM | POA: Diagnosis not present

## 2022-06-29 DIAGNOSIS — Z992 Dependence on renal dialysis: Secondary | ICD-10-CM | POA: Diagnosis not present

## 2022-06-29 DIAGNOSIS — D689 Coagulation defect, unspecified: Secondary | ICD-10-CM | POA: Diagnosis not present

## 2022-06-29 DIAGNOSIS — N186 End stage renal disease: Secondary | ICD-10-CM | POA: Diagnosis not present

## 2022-06-29 DIAGNOSIS — T7840XA Allergy, unspecified, initial encounter: Secondary | ICD-10-CM | POA: Diagnosis not present

## 2022-06-29 DIAGNOSIS — D509 Iron deficiency anemia, unspecified: Secondary | ICD-10-CM | POA: Diagnosis not present

## 2022-06-29 DIAGNOSIS — N2581 Secondary hyperparathyroidism of renal origin: Secondary | ICD-10-CM | POA: Diagnosis not present

## 2022-06-29 LAB — PSA: Prostate Specific Ag, Serum: <0.1 ng/mL (ref 0.0–4.0)

## 2022-07-01 ENCOUNTER — Telehealth: Payer: Self-pay | Admitting: Family Medicine

## 2022-07-01 DIAGNOSIS — Z992 Dependence on renal dialysis: Secondary | ICD-10-CM | POA: Diagnosis not present

## 2022-07-01 DIAGNOSIS — R52 Pain, unspecified: Secondary | ICD-10-CM | POA: Diagnosis not present

## 2022-07-01 DIAGNOSIS — D631 Anemia in chronic kidney disease: Secondary | ICD-10-CM | POA: Diagnosis not present

## 2022-07-01 DIAGNOSIS — N2581 Secondary hyperparathyroidism of renal origin: Secondary | ICD-10-CM | POA: Diagnosis not present

## 2022-07-01 DIAGNOSIS — N186 End stage renal disease: Secondary | ICD-10-CM | POA: Diagnosis not present

## 2022-07-01 DIAGNOSIS — D509 Iron deficiency anemia, unspecified: Secondary | ICD-10-CM | POA: Diagnosis not present

## 2022-07-01 DIAGNOSIS — T7840XA Allergy, unspecified, initial encounter: Secondary | ICD-10-CM | POA: Diagnosis not present

## 2022-07-01 DIAGNOSIS — D689 Coagulation defect, unspecified: Secondary | ICD-10-CM | POA: Diagnosis not present

## 2022-07-01 NOTE — Telephone Encounter (Signed)
[facility] can't get him set up for transfusion until Jan  8th, he wants to know what you think about that and can you get him set up for that sooner

## 2022-07-03 DIAGNOSIS — R52 Pain, unspecified: Secondary | ICD-10-CM | POA: Diagnosis not present

## 2022-07-03 DIAGNOSIS — N2581 Secondary hyperparathyroidism of renal origin: Secondary | ICD-10-CM | POA: Diagnosis not present

## 2022-07-03 DIAGNOSIS — D689 Coagulation defect, unspecified: Secondary | ICD-10-CM | POA: Diagnosis not present

## 2022-07-03 DIAGNOSIS — N138 Other obstructive and reflux uropathy: Secondary | ICD-10-CM | POA: Diagnosis not present

## 2022-07-03 DIAGNOSIS — N186 End stage renal disease: Secondary | ICD-10-CM | POA: Diagnosis not present

## 2022-07-03 DIAGNOSIS — Z992 Dependence on renal dialysis: Secondary | ICD-10-CM | POA: Diagnosis not present

## 2022-07-03 DIAGNOSIS — D631 Anemia in chronic kidney disease: Secondary | ICD-10-CM | POA: Diagnosis not present

## 2022-07-03 DIAGNOSIS — T7840XA Allergy, unspecified, initial encounter: Secondary | ICD-10-CM | POA: Diagnosis not present

## 2022-07-03 DIAGNOSIS — D509 Iron deficiency anemia, unspecified: Secondary | ICD-10-CM | POA: Diagnosis not present

## 2022-07-06 DIAGNOSIS — T7840XA Allergy, unspecified, initial encounter: Secondary | ICD-10-CM | POA: Diagnosis not present

## 2022-07-06 DIAGNOSIS — N2581 Secondary hyperparathyroidism of renal origin: Secondary | ICD-10-CM | POA: Diagnosis not present

## 2022-07-06 DIAGNOSIS — D509 Iron deficiency anemia, unspecified: Secondary | ICD-10-CM | POA: Diagnosis not present

## 2022-07-06 DIAGNOSIS — Z992 Dependence on renal dialysis: Secondary | ICD-10-CM | POA: Diagnosis not present

## 2022-07-06 DIAGNOSIS — D689 Coagulation defect, unspecified: Secondary | ICD-10-CM | POA: Diagnosis not present

## 2022-07-06 DIAGNOSIS — N186 End stage renal disease: Secondary | ICD-10-CM | POA: Diagnosis not present

## 2022-07-06 DIAGNOSIS — D631 Anemia in chronic kidney disease: Secondary | ICD-10-CM | POA: Diagnosis not present

## 2022-07-06 DIAGNOSIS — R52 Pain, unspecified: Secondary | ICD-10-CM | POA: Diagnosis not present

## 2022-07-08 DIAGNOSIS — R52 Pain, unspecified: Secondary | ICD-10-CM | POA: Diagnosis not present

## 2022-07-08 DIAGNOSIS — N2581 Secondary hyperparathyroidism of renal origin: Secondary | ICD-10-CM | POA: Diagnosis not present

## 2022-07-08 DIAGNOSIS — Z992 Dependence on renal dialysis: Secondary | ICD-10-CM | POA: Diagnosis not present

## 2022-07-08 DIAGNOSIS — D631 Anemia in chronic kidney disease: Secondary | ICD-10-CM | POA: Diagnosis not present

## 2022-07-08 DIAGNOSIS — D689 Coagulation defect, unspecified: Secondary | ICD-10-CM | POA: Diagnosis not present

## 2022-07-08 DIAGNOSIS — T7840XA Allergy, unspecified, initial encounter: Secondary | ICD-10-CM | POA: Diagnosis not present

## 2022-07-08 DIAGNOSIS — N186 End stage renal disease: Secondary | ICD-10-CM | POA: Diagnosis not present

## 2022-07-08 DIAGNOSIS — D509 Iron deficiency anemia, unspecified: Secondary | ICD-10-CM | POA: Diagnosis not present

## 2022-07-11 DIAGNOSIS — D689 Coagulation defect, unspecified: Secondary | ICD-10-CM | POA: Diagnosis not present

## 2022-07-11 DIAGNOSIS — N186 End stage renal disease: Secondary | ICD-10-CM | POA: Diagnosis not present

## 2022-07-11 DIAGNOSIS — N2581 Secondary hyperparathyroidism of renal origin: Secondary | ICD-10-CM | POA: Diagnosis not present

## 2022-07-11 DIAGNOSIS — T7840XA Allergy, unspecified, initial encounter: Secondary | ICD-10-CM | POA: Diagnosis not present

## 2022-07-11 DIAGNOSIS — R52 Pain, unspecified: Secondary | ICD-10-CM | POA: Diagnosis not present

## 2022-07-11 DIAGNOSIS — D509 Iron deficiency anemia, unspecified: Secondary | ICD-10-CM | POA: Diagnosis not present

## 2022-07-11 DIAGNOSIS — Z992 Dependence on renal dialysis: Secondary | ICD-10-CM | POA: Diagnosis not present

## 2022-07-11 DIAGNOSIS — D631 Anemia in chronic kidney disease: Secondary | ICD-10-CM | POA: Diagnosis not present

## 2022-07-12 ENCOUNTER — Ambulatory Visit: Payer: Medicare Other | Admitting: Family Medicine

## 2022-07-13 ENCOUNTER — Other Ambulatory Visit: Payer: Self-pay

## 2022-07-13 ENCOUNTER — Emergency Department (HOSPITAL_COMMUNITY)
Admission: EM | Admit: 2022-07-13 | Discharge: 2022-07-14 | Disposition: A | Discharge status: Home / Self Care | Payer: Medicare Other | Attending: Emergency Medicine | Admitting: Emergency Medicine

## 2022-07-13 ENCOUNTER — Non-Acute Institutional Stay (HOSPITAL_COMMUNITY)
Admission: RE | Admit: 2022-07-13 | Discharge: 2022-07-13 | Disposition: A | Discharge status: Home / Self Care | Payer: Medicare Other | Source: Ambulatory Visit | Attending: Internal Medicine | Admitting: Internal Medicine

## 2022-07-13 ENCOUNTER — Telehealth: Payer: Self-pay | Admitting: Internal Medicine

## 2022-07-13 ENCOUNTER — Emergency Department (HOSPITAL_COMMUNITY): Payer: Medicare Other

## 2022-07-13 VITALS — BP 188/99 | HR 72 | Temp 97.6°F | Resp 16

## 2022-07-13 DIAGNOSIS — Z79899 Other long term (current) drug therapy: Secondary | ICD-10-CM | POA: Insufficient documentation

## 2022-07-13 DIAGNOSIS — Z992 Dependence on renal dialysis: Secondary | ICD-10-CM | POA: Diagnosis not present

## 2022-07-13 DIAGNOSIS — F418 Other specified anxiety disorders: Secondary | ICD-10-CM | POA: Diagnosis not present

## 2022-07-13 DIAGNOSIS — N186 End stage renal disease: Secondary | ICD-10-CM | POA: Insufficient documentation

## 2022-07-13 DIAGNOSIS — R0602 Shortness of breath: Secondary | ICD-10-CM | POA: Diagnosis not present

## 2022-07-13 DIAGNOSIS — R079 Chest pain, unspecified: Secondary | ICD-10-CM | POA: Diagnosis present

## 2022-07-13 DIAGNOSIS — Z1152 Encounter for screening for COVID-19: Secondary | ICD-10-CM | POA: Diagnosis not present

## 2022-07-13 DIAGNOSIS — J811 Chronic pulmonary edema: Secondary | ICD-10-CM | POA: Diagnosis not present

## 2022-07-13 DIAGNOSIS — Z7982 Long term (current) use of aspirin: Secondary | ICD-10-CM | POA: Insufficient documentation

## 2022-07-13 DIAGNOSIS — R69 Illness, unspecified: Secondary | ICD-10-CM

## 2022-07-13 DIAGNOSIS — D649 Anemia, unspecified: Secondary | ICD-10-CM

## 2022-07-13 DIAGNOSIS — D631 Anemia in chronic kidney disease: Secondary | ICD-10-CM | POA: Diagnosis not present

## 2022-07-13 DIAGNOSIS — I12 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 stage renal disease: Secondary | ICD-10-CM | POA: Insufficient documentation

## 2022-07-13 DIAGNOSIS — I10 Essential (primary) hypertension: Secondary | ICD-10-CM

## 2022-07-13 LAB — CBC WITH DIFFERENTIAL/PLATELET
Abs Immature Granulocytes: 0.05 10*3/uL (ref 0.00–0.07)
Basophils Absolute: 0.1 10*3/uL (ref 0.0–0.1)
Basophils Relative: 0 %
Eosinophils Absolute: 0.6 10*3/uL — ABNORMAL HIGH (ref 0.0–0.5)
Eosinophils Relative: 5 %
HCT: 22.8 % — ABNORMAL LOW (ref 39.0–52.0)
Hemoglobin: 8.1 g/dL — ABNORMAL LOW (ref 13.0–17.0)
Immature Granulocytes: 0 %
Lymphocytes Relative: 9 %
Lymphs Abs: 1.1 10*3/uL (ref 0.7–4.0)
MCH: 29.1 pg (ref 26.0–34.0)
MCHC: 35.5 g/dL (ref 30.0–36.0)
MCV: 82 fL (ref 80.0–100.0)
Monocytes Absolute: 1.3 10*3/uL — ABNORMAL HIGH (ref 0.1–1.0)
Monocytes Relative: 11 %
Neutro Abs: 8.8 10*3/uL — ABNORMAL HIGH (ref 1.7–7.7)
Neutrophils Relative %: 75 %
Platelets: 188 10*3/uL (ref 150–400)
RBC: 2.78 MIL/uL — ABNORMAL LOW (ref 4.22–5.81)
RDW: 13.8 % (ref 11.5–15.5)
WBC: 11.9 10*3/uL — ABNORMAL HIGH (ref 4.0–10.5)
nRBC: 0 % (ref 0.0–0.2)

## 2022-07-13 LAB — CBC
HCT: 19.2 % — ABNORMAL LOW (ref 39.0–52.0)
Hemoglobin: 6.6 g/dL — CL (ref 13.0–17.0)
MCH: 28.2 pg (ref 26.0–34.0)
MCHC: 34.4 g/dL (ref 30.0–36.0)
MCV: 82.1 fL (ref 80.0–100.0)
Platelets: 190 10*3/uL (ref 150–400)
RBC: 2.34 MIL/uL — ABNORMAL LOW (ref 4.22–5.81)
RDW: 13.6 % (ref 11.5–15.5)
WBC: 8.7 10*3/uL (ref 4.0–10.5)
nRBC: 0 % (ref 0.0–0.2)

## 2022-07-13 LAB — RESP PANEL BY RT-PCR (RSV, FLU A&B, COVID)  RVPGX2
Influenza A by PCR: NEGATIVE
Influenza B by PCR: NEGATIVE
Resp Syncytial Virus by PCR: NEGATIVE
SARS Coronavirus 2 by RT PCR: NEGATIVE

## 2022-07-13 LAB — BASIC METABOLIC PANEL
Anion gap: 13 (ref 5–15)
BUN: 59 mg/dL — ABNORMAL HIGH (ref 8–23)
CO2: 22 mmol/L (ref 22–32)
Calcium: 8.9 mg/dL (ref 8.9–10.3)
Chloride: 105 mmol/L (ref 98–111)
Creatinine, Ser: 11.37 mg/dL — ABNORMAL HIGH (ref 0.61–1.24)
GFR, Estimated: 5 mL/min — ABNORMAL LOW (ref >60)
Glucose, Bld: 99 mg/dL (ref 70–99)
Potassium: 5.6 mmol/L — ABNORMAL HIGH (ref 3.5–5.1)
Sodium: 140 mmol/L (ref 135–145)

## 2022-07-13 LAB — PREPARE RBC (CROSSMATCH)

## 2022-07-13 LAB — TROPONIN I (HIGH SENSITIVITY): Troponin I (High Sensitivity): 79 ng/L — ABNORMAL HIGH (ref <18)

## 2022-07-13 LAB — BRAIN NATRIURETIC PEPTIDE: B Natriuretic Peptide: 1007.5 pg/mL — ABNORMAL HIGH (ref 0.0–100.0)

## 2022-07-13 MED ORDER — ACETAMINOPHEN 325 MG PO TABS
650.0000 mg | ORAL_TABLET | Freq: Once | ORAL | Status: AC
  Administered 2022-07-13: 650 mg via ORAL
  Filled 2022-07-13: qty 2

## 2022-07-13 MED ORDER — DIPHENHYDRAMINE HCL 25 MG PO CAPS
25.0000 mg | ORAL_CAPSULE | Freq: Once | ORAL | Status: AC
  Administered 2022-07-13: 25 mg via ORAL
  Filled 2022-07-13: qty 1

## 2022-07-13 MED ORDER — SODIUM CHLORIDE 0.9% IV SOLUTION: Freq: Once | INTRAVENOUS | Status: AC

## 2022-07-13 NOTE — ED Provider Triage Note (Signed)
Emergency Medicine Provider Triage Evaluation Note  Devon Cook , a 62 y.o. male  was evaluated in triage.  Pt complains of breath, chest pain, and hypertension.  Shortness of breath has been intermittent for the past few days.  Found to be anemic and had a blood transfusion today.  Patient states persistent high blood pressure.  He has been compliant with his medications.  History of prostate cancer. ESRD. Last session Monday.   Review of Systems  Positive: CP, SOB Negative: fever  Physical Exam  BP (!) 200/106   Pulse 82   Temp 98.9 F (37.2 C) (Oral)   Resp 18   Ht '6\' 2"'$  (1.88 m)   Wt 134.7 kg   SpO2 100%   BMI 38.13 kg/m  Gen:   Awake, no distress   Resp:  Normal effort  MSK:   Moves extremities without difficulty  Other:    Medical Decision Making  Medically screening exam initiated at 7:43 PM.  Appropriate orders placed.  Devon Cook was informed that the remainder of the evaluation will be completed by another provider, this initial triage assessment does not replace that evaluation, and the importance of remaining in the ED until their evaluation is complete.  Labs CXR EKG RVP   Suzy Bouchard, [place] 07/13/22 1944

## 2022-07-13 NOTE — ED Triage Notes (Signed)
Pt arrives pov c/o chest pain, shortness of breath, and hypertension. Pt states shortness of breath has been ongoing for days but chest pain started today. He describes the chest pain as centralized dull pain that radiates to his abdomen. Pt was seen at [facility] patient care center this morning for a blood transfusion and bp was elevated at d/c but was told to take meds at home. Pt states he took meds at 4pm and bp was still elevated. Dialysis MWF - missed session today.

## 2022-07-13 NOTE — Progress Notes (Signed)
PATIENT CARE CENTER NOTE:  Provider: Madelon Lips MD  Diagnosis: anemia  Procedure: 1 unit PRBC    Patient received via PIV 1 unit of packed red blood cells. Type and screen and CBC was was done before transfusion per orders. Spoke with Obas RN at [facility] to clarify that CBC can be collected ,blood is to run over 3 hours and premeds are needed, verbal order given. Spoke with Jen Mow PA on call for HD center, informing  that pt Hgb is 6.6 today, pt states he feels like he needs 2 units of blood . Per Ria Comment PA, transfuse only 1 unit today due to pt fluid load. Pt had concerns about being SOB at home, inquiring about home oxygen, per provider pt to discuss this with PCP. Ria Comment PA also notified of pre transfusion BP 171/83, verbal order given to administer blood no further intervention required. Per Minette Headland CMA for pt's PCP, he is to call the office and schedule an appointment for this week to discuss home O2 with Dr. Redmond School, pt notifed and verbalized understanding. Pre transfusion medications Tylenol 650 mg and Benadryl '25mg'$  PO were given.  Post transfusion H&H not required per orders. Tolerated transfusion well, vitals stable, BP remained elevated post transfusion 187/90 provider notified via secure chat, instruct pt to check BP at home and go to ED with any symptoms of CAD/stroke, pt made aware and verbalized understanding. Pt states he will take his BP meds once he is home and he will go to scheduled HD visit and PCP visits tomorrow. Discharge instructions given, verbalized understanding. Patient alert, oriented and ambulatory at the time of discharge.

## 2022-07-13 NOTE — Telephone Encounter (Signed)
Pt called and getting a blood transfusion right now and he has a question about it and wanted you to call him. He would not tell me the details. He will be getting transfusion for 3 hours

## 2022-07-14 ENCOUNTER — Ambulatory Visit: Payer: Medicare Other | Admitting: Family Medicine

## 2022-07-14 ENCOUNTER — Inpatient Hospital Stay (HOSPITAL_COMMUNITY)
Admission: RE | Admit: 2022-07-14 | Payer: Medicare Other | Source: Ambulatory Visit | Attending: Nephrology | Admitting: Nephrology

## 2022-07-14 DIAGNOSIS — R52 Pain, unspecified: Secondary | ICD-10-CM | POA: Diagnosis not present

## 2022-07-14 DIAGNOSIS — D689 Coagulation defect, unspecified: Secondary | ICD-10-CM | POA: Diagnosis not present

## 2022-07-14 DIAGNOSIS — N186 End stage renal disease: Secondary | ICD-10-CM | POA: Diagnosis not present

## 2022-07-14 DIAGNOSIS — D631 Anemia in chronic kidney disease: Secondary | ICD-10-CM | POA: Diagnosis not present

## 2022-07-14 DIAGNOSIS — N2581 Secondary hyperparathyroidism of renal origin: Secondary | ICD-10-CM | POA: Diagnosis not present

## 2022-07-14 DIAGNOSIS — D509 Iron deficiency anemia, unspecified: Secondary | ICD-10-CM | POA: Diagnosis not present

## 2022-07-14 DIAGNOSIS — T7840XA Allergy, unspecified, initial encounter: Secondary | ICD-10-CM | POA: Diagnosis not present

## 2022-07-14 DIAGNOSIS — Z992 Dependence on renal dialysis: Secondary | ICD-10-CM | POA: Diagnosis not present

## 2022-07-14 LAB — BPAM RBC
Blood Product Expiration Date: 202402032359
ISSUE DATE / TIME: 202401101105
Unit Type and Rh: 7300

## 2022-07-14 LAB — TYPE AND SCREEN
ABO/RH(D): B POS
Antibody Screen: NEGATIVE
Unit division: 0

## 2022-07-14 LAB — TROPONIN I (HIGH SENSITIVITY): Troponin I (High Sensitivity): 87 ng/L — ABNORMAL HIGH (ref <18)

## 2022-07-14 MED ORDER — LOSARTAN POTASSIUM 50 MG PO TABS
50.0000 mg | ORAL_TABLET | Freq: Once | ORAL | Status: AC
  Administered 2022-07-14: 50 mg via ORAL
  Filled 2022-07-14: qty 1

## 2022-07-14 MED ORDER — METOPROLOL SUCCINATE ER 25 MG PO TB24
50.0000 mg | ORAL_TABLET | Freq: Every day | ORAL | Status: DC
  Administered 2022-07-14: 50 mg via ORAL
  Filled 2022-07-14: qty 2

## 2022-07-14 MED ORDER — HYDRALAZINE HCL 20 MG/ML IJ SOLN: 20.0000 mg | Freq: Once | INTRAMUSCULAR | Status: DC

## 2022-07-14 MED ORDER — AMLODIPINE BESYLATE 5 MG PO TABS
10.0000 mg | ORAL_TABLET | Freq: Once | ORAL | Status: AC
  Administered 2022-07-14: 10 mg via ORAL
  Filled 2022-07-14: qty 2

## 2022-07-14 NOTE — Discharge Instructions (Signed)
1.  You have been given your blood pressure medications this morning in the emergency department.  They should start to work over the next several hours.  You are going directly to your dialysis center from the emergency department.  Will need to take your evening blood pressure medications tonight as well unless your blood pressure is low after dialysis.  Measure your blood pressure after dialysis and this evening. 2.  Call your nephrologist tomorrow to continue monitor your blood pressure if your medications are not effectively managing your blood pressure. 3.  Return to the emergency department if you are having new worsening or concerning symptoms such as worsening shortness of breath, chest pain, headaches or other concerning symptoms.

## 2022-07-14 NOTE — Progress Notes (Addendum)
Contacted by nephrologist regarding pt currently being in the ED and needing out-pt treatment at clinic. Pt receives out-pt HD at [facility] (MWF). Spoke to staff at clinic who state pt was supposed to have a treatment today at 5:45 am (make up treatment). Clinic advised that pt was in the ED and that pt does not require HD here and could pt receive HD at clinic later today. Clinic can treat pt today if pt arrives at 11:00 am. Attempted to reach pt via phone but unable to reach pt and unable to leave a message. Update provided to nephrologist who plans to f/u with ED provider.    Melven Sartorius Renal Navigator [phone]  Addendum at 9:30 am: Contacted ED RN via secure chat to make her aware of the above info and to request that she make pt aware of 11:00 arrival time since navigator unable to reach pt via phone.

## 2022-07-14 NOTE — ED Notes (Signed)
Pt states that he has dialysis at 5:45 in the morning today and was questioning whether he would get it here, or if he was going to get told to go home and then he would get it at his normal dialysis center.  States his legs are swelling up from sitting in the chair that he's in.

## 2022-07-14 NOTE — ED Provider Notes (Signed)
[facility] EMERGENCY DEPARTMENT Provider Note   CSN: [id number] Arrival date & time: 07/13/22  1826     History  Chief Complaint  Patient presents with   Chest Pain   Shortness of Breath    Devon Cook is a 62 y.o. male.  HPI Patient has ESRD and is on dialysis.  He required a unit of blood transfusion yesterday for anemia.  He reports that he is chronically short of breath but his blood pressure was very elevated yesterday after transfusion.  He came to the emergency department last night for elevated blood pressure.  He denies he is having any chest pain.  Shortness of breath is not significantly worse than baseline.  He did not get any of his blood pressure medications last night due to being in the department waiting.  This morning blood pressures are very elevated.  Patient denies he has headache, blurred vision or chest pain.  He reports he needs his dialysis and now has missed his morning session.    Home Medications Prior to Admission medications   Medication Sig Start Date End Date Taking? Authorizing Provider  acetaminophen (TYLENOL) 500 MG tablet Take 1,500 mg by mouth every 6 (six) hours as needed for moderate pain.    [provider]  albuterol (VENTOLIN HFA) 108 (90 Base) MCG/ACT inhaler TAKE 2 PUFFS BY MOUTH EVERY 6 HOURS AS NEEDED FOR WHEEZE OR SHORTNESS OF BREATH Patient taking differently: Inhale 2 puffs into the lungs every 6 (six) hours as needed for wheezing or shortness of breath. 12/27/19   [name], MD  allopurinol (ZYLOPRIM) 100 MG tablet TAKE 1 TABLET BY MOUTH ONCE DAILY Patient taking differently: Take 100 mg by mouth daily. 01/22/18   [name], MD  ALPRAZolam (XANAX) 0.25 MG tablet TAKE 1 TABLET BY MOUTH DAILY AS NEEDED FOR ANXIETY Patient taking differently: Take 0.25 mg by mouth as needed for anxiety. 09/06/21   Tysinger, Camelia Eng, PA-C  amLODipine (NORVASC) 10 MG tablet Take 1 tablet (10 mg total) by mouth at  bedtime. 06/02/22   [name], MD  ascorbic acid (VITAMIN C) 500 MG tablet Take 1,000 mg by mouth daily.    [provider]  aspirin EC 81 MG tablet Take 81 mg by mouth daily.    [provider]  cinacalcet (SENSIPAR) 90 MG tablet Take 90 mg by mouth every evening. 06/10/20   [provider]  diphenhydrAMINE (BENADRYL) 25 MG tablet Take 25 mg by mouth every 6 (six) hours as needed for allergies.    [provider]  Doxercalciferol (HECTOROL IV) Doxercalciferol (Hectorol) 07/16/21 07/15/22  [provider]  ferric citrate (AURYXIA) 1 GM 210 MG(Fe) tablet Take 630 mg by mouth 3 (three) times daily with meals.    [provider]  Ferrous Sulfate (IRON) 325 (65 Fe) MG TABS Take 2 tablets by mouth daily.    [provider]  lidocaine-prilocaine (EMLA) cream     [provider]  losartan (COZAAR) 50 MG tablet Take 50 mg by mouth daily. 03/22/21   [provider]  metoprolol succinate (TOPROL XL) 25 MG 24 hr tablet Take 1 tablet (25 mg total) by mouth daily. Please make overdue appt with Dr. Johney Frame before anymore refills. Thank you 1st attempt 04/09/21   Freada Bergeron, MD  Multiple Vitamins-Minerals (MULTIVITAMIN WITH MINERALS) tablet Take 1 tablet by mouth daily.    [provider]  multivitamin (RENA-VIT) TABS tablet Take 1 tablet  by mouth daily. 04/23/20   [provider]  oxyCODONE (ROXICODONE) 5 MG immediate release tablet Take 1 tablet (5 mg total) by mouth every 6 (six) hours as needed for moderate pain or severe pain (post-operatively and for cancer pain). 06/17/22 06/17/23  Alexis Frock, MD  pantoprazole (PROTONIX) 40 MG tablet Take 1 tablet (40 mg total) by mouth 2 (two) times daily. Patient taking differently: Take 40 mg by mouth 2 (two) times daily. 07/17/19   Thornton Park, MD  vitamin B-12 (CYANOCOBALAMIN) 1000 MCG tablet Take 1,000 mcg by mouth daily.    [provider]   XTANDI 40 MG capsule Take 40 mg by mouth at bedtime. 04/14/20   [provider]  zinc gluconate 50 MG tablet Take 50 mg by mouth daily.    [provider]      Allergies    Patient has no known allergies.    Review of Systems   Review of Systems  Physical Exam Updated Vital Signs BP (!) 199/110   Pulse 81   Temp 98.1 F (36.7 C)   Resp (!) 22   Ht '6\' 2"'$  (1.88 m)   Wt 134.7 kg   SpO2 99%   BMI 38.13 kg/m  Physical Exam Constitutional:      Comments: Alert nontoxic.  No respiratory distress at rest.  HENT:     Mouth/Throat:     Pharynx: Oropharynx is clear.  Eyes:     Extraocular Movements: Extraocular movements intact.  Cardiovascular:     Rate and Rhythm: Normal rate and regular rhythm.  Pulmonary:     Effort: Pulmonary effort is normal.     Breath sounds: Normal breath sounds.  Abdominal:     General: There is no distension.     Palpations: Abdomen is soft.     Tenderness: There is no abdominal tenderness. There is no guarding.  Musculoskeletal:        General: No swelling or tenderness. Normal range of motion.     Right lower leg: No edema.     Left lower leg: No edema.  Skin:    General: Skin is warm and dry.  Neurological:     General: No focal deficit present.     Mental Status: He is oriented to person, place, and time.     Motor: No weakness.     Coordination: Coordination normal.  Psychiatric:        Mood and Affect: Mood normal.     ED Results / Procedures / Treatments   Labs (all labs ordered are listed, but only abnormal results are displayed) Labs Reviewed  CBC WITH DIFFERENTIAL/PLATELET - Abnormal; Notable for the following components:      Result Value   WBC 11.9 (*)    RBC 2.78 (*)    Hemoglobin 8.1 (*)    HCT 22.8 (*)    Neutro Abs 8.8 (*)    Monocytes Absolute 1.3 (*)    Eosinophils Absolute 0.6 (*)    All other components within normal limits  BASIC METABOLIC PANEL - Abnormal; Notable for the following  components:   Potassium 5.6 (*)    BUN 59 (*)    Creatinine, Ser 11.37 (*)    GFR, Estimated 5 (*)    All other components within normal limits  BRAIN NATRIURETIC PEPTIDE - Abnormal; Notable for the following components:   B Natriuretic Peptide 1,007.5 (*)    All other components within normal limits  TROPONIN I (HIGH SENSITIVITY) - Abnormal; Notable  for the following components:   Troponin I (High Sensitivity) 79 (*)    All other components within normal limits  TROPONIN I (HIGH SENSITIVITY) - Abnormal; Notable for the following components:   Troponin I (High Sensitivity) 87 (*)    All other components within normal limits  RESP PANEL BY RT-PCR (RSV, FLU A&B, COVID)  [id number]    EKG EKG Interpretation  Date/Time:  Wednesday July 13 2022 20:37:38 EST Ventricular Rate:  77 PR Interval:  184 QRS Duration: 96 QT Interval:  410 QTC Calculation: 463 R Axis:   81 Text Interpretation: Normal sinus rhythm Normal ECG When compared with ECG of 13-Jul-2022 20:35, PREVIOUS ECG IS PRESENT normal, no change Confirmed by Charlesetta Shanks [phone]) on 07/14/2022 8:26:23 AM  Radiology DG Chest 1 View  Result Date: 07/13/2022 CLINICAL DATA:  Shortness of breath EXAM: CHEST  1 VIEW COMPARISON:  Chest x-ray 08/24/2021 FINDINGS: Heart is enlarged. There central pulmonary vascular congestion. Right-sided central venous catheter tip projects over the SVC. There is no pleural effusion or pneumothorax. No acute fractures are seen. IMPRESSION: Cardiomegaly with central pulmonary vascular congestion. Electronically Signed   By: Ronney Asters M.D.   On: 07/13/2022 20:59    Procedures Procedures    Medications Ordered in ED Medications  metoprolol succinate (TOPROL-XL) 24 hr tablet 50 mg (50 mg Oral Given 07/14/22 0900)  losartan (COZAAR) tablet 50 mg (50 mg Oral Given 07/14/22 0900)  amLODipine (NORVASC) tablet 10 mg (10 mg Oral Given 07/14/22 0859)    ED Course/ Medical Decision Making/ A&P                            Medical Decision Making Risk Prescription drug management.   Patient has significant comorbid conditions of ESRD on dialysis and chronic anemia.  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  Patient has chronic recurrent anemia and was transfused 1 unit yesterday outpatient.  After transfusion, blood pressures were high.  Patient presented to the emergency department for hypertension.  He did not get medications last night and is due for dialysis this morning.  Lab work and x-rays obtained through triage.  I have personally reviewed chest x-ray images and radiology review, chest x-ray shows vascular congestion and cardiomegaly.  Potassium was at 5.6.  Hemoglobin comes back at 8.1.  Clinically the patient is alert and not in acute distress.  He is sitting at the edge of the stretcher.  Mental status is clear.  He is not requiring supplemental oxygen or BiPAP.  Blood pressures are significantly bated at 197/113.  He has not had his blood pressure medications and missed dialysis.  At this time we will order his oral blood pressure medications of Toprol, amlodipine and losartan.  Will consult nephrology.  Consult: Reviewed with Dr. Melvia Heaps.  He will review and assess for dialysis availability.  Dr. Burnett Sheng has called back and advised that the patient can be dialyzed at his normal dialysis center if he can be there before 11 AM.  I have rechecked the patient.  He has had his blood pressure medications.  He is clinically well in appearance.  Mental status is clear.  He shows no signs of somnolence, respiratory distress or headache.  At this time he is stable for discharge to get dialyzed ASAP on outpatient basis.  Patient is agreeable with this and ready to go directly to his dialysis center.  Final Clinical Impression(s) / ED Diagnoses Final diagnoses:  Essential hypertension  ESRD needing dialysis ([place])   Severe comorbid illness    Rx / DC Orders ED Discharge Orders     None         Charlesetta Shanks, MD 07/14/22 [phone]

## 2022-07-15 DIAGNOSIS — Z992 Dependence on renal dialysis: Secondary | ICD-10-CM | POA: Diagnosis not present

## 2022-07-15 DIAGNOSIS — R52 Pain, unspecified: Secondary | ICD-10-CM | POA: Diagnosis not present

## 2022-07-15 DIAGNOSIS — N186 End stage renal disease: Secondary | ICD-10-CM | POA: Diagnosis not present

## 2022-07-15 DIAGNOSIS — D689 Coagulation defect, unspecified: Secondary | ICD-10-CM | POA: Diagnosis not present

## 2022-07-15 DIAGNOSIS — T7840XA Allergy, unspecified, initial encounter: Secondary | ICD-10-CM | POA: Diagnosis not present

## 2022-07-15 DIAGNOSIS — D631 Anemia in chronic kidney disease: Secondary | ICD-10-CM | POA: Diagnosis not present

## 2022-07-15 DIAGNOSIS — D509 Iron deficiency anemia, unspecified: Secondary | ICD-10-CM | POA: Diagnosis not present

## 2022-07-15 DIAGNOSIS — N2581 Secondary hyperparathyroidism of renal origin: Secondary | ICD-10-CM | POA: Diagnosis not present

## 2022-07-18 DIAGNOSIS — N2581 Secondary hyperparathyroidism of renal origin: Secondary | ICD-10-CM | POA: Diagnosis not present

## 2022-07-18 DIAGNOSIS — D689 Coagulation defect, unspecified: Secondary | ICD-10-CM | POA: Diagnosis not present

## 2022-07-18 DIAGNOSIS — D631 Anemia in chronic kidney disease: Secondary | ICD-10-CM | POA: Diagnosis not present

## 2022-07-18 DIAGNOSIS — N186 End stage renal disease: Secondary | ICD-10-CM | POA: Diagnosis not present

## 2022-07-18 DIAGNOSIS — D509 Iron deficiency anemia, unspecified: Secondary | ICD-10-CM | POA: Diagnosis not present

## 2022-07-18 DIAGNOSIS — Z992 Dependence on renal dialysis: Secondary | ICD-10-CM | POA: Diagnosis not present

## 2022-07-18 DIAGNOSIS — R52 Pain, unspecified: Secondary | ICD-10-CM | POA: Diagnosis not present

## 2022-07-18 DIAGNOSIS — T7840XA Allergy, unspecified, initial encounter: Secondary | ICD-10-CM | POA: Diagnosis not present

## 2022-07-20 DIAGNOSIS — T7840XA Allergy, unspecified, initial encounter: Secondary | ICD-10-CM | POA: Diagnosis not present

## 2022-07-20 DIAGNOSIS — D509 Iron deficiency anemia, unspecified: Secondary | ICD-10-CM | POA: Diagnosis not present

## 2022-07-20 DIAGNOSIS — N2581 Secondary hyperparathyroidism of renal origin: Secondary | ICD-10-CM | POA: Diagnosis not present

## 2022-07-20 DIAGNOSIS — R52 Pain, unspecified: Secondary | ICD-10-CM | POA: Diagnosis not present

## 2022-07-20 DIAGNOSIS — D689 Coagulation defect, unspecified: Secondary | ICD-10-CM | POA: Diagnosis not present

## 2022-07-20 DIAGNOSIS — Z992 Dependence on renal dialysis: Secondary | ICD-10-CM | POA: Diagnosis not present

## 2022-07-20 DIAGNOSIS — N186 End stage renal disease: Secondary | ICD-10-CM | POA: Diagnosis not present

## 2022-07-20 DIAGNOSIS — D631 Anemia in chronic kidney disease: Secondary | ICD-10-CM | POA: Diagnosis not present

## 2022-07-21 ENCOUNTER — Inpatient Hospital Stay: Payer: Medicare Other | Admitting: Family Medicine

## 2022-07-22 DIAGNOSIS — D509 Iron deficiency anemia, unspecified: Secondary | ICD-10-CM | POA: Diagnosis not present

## 2022-07-22 DIAGNOSIS — T7840XA Allergy, unspecified, initial encounter: Secondary | ICD-10-CM | POA: Diagnosis not present

## 2022-07-22 DIAGNOSIS — R52 Pain, unspecified: Secondary | ICD-10-CM | POA: Diagnosis not present

## 2022-07-22 DIAGNOSIS — N186 End stage renal disease: Secondary | ICD-10-CM | POA: Diagnosis not present

## 2022-07-22 DIAGNOSIS — N2581 Secondary hyperparathyroidism of renal origin: Secondary | ICD-10-CM | POA: Diagnosis not present

## 2022-07-22 DIAGNOSIS — Z992 Dependence on renal dialysis: Secondary | ICD-10-CM | POA: Diagnosis not present

## 2022-07-22 DIAGNOSIS — D689 Coagulation defect, unspecified: Secondary | ICD-10-CM | POA: Diagnosis not present

## 2022-07-22 DIAGNOSIS — D631 Anemia in chronic kidney disease: Secondary | ICD-10-CM | POA: Diagnosis not present

## 2022-07-25 DIAGNOSIS — D631 Anemia in chronic kidney disease: Secondary | ICD-10-CM | POA: Diagnosis not present

## 2022-07-25 DIAGNOSIS — Z992 Dependence on renal dialysis: Secondary | ICD-10-CM | POA: Diagnosis not present

## 2022-07-25 DIAGNOSIS — D689 Coagulation defect, unspecified: Secondary | ICD-10-CM | POA: Diagnosis not present

## 2022-07-25 DIAGNOSIS — D509 Iron deficiency anemia, unspecified: Secondary | ICD-10-CM | POA: Diagnosis not present

## 2022-07-25 DIAGNOSIS — N2581 Secondary hyperparathyroidism of renal origin: Secondary | ICD-10-CM | POA: Diagnosis not present

## 2022-07-25 DIAGNOSIS — N186 End stage renal disease: Secondary | ICD-10-CM | POA: Diagnosis not present

## 2022-07-25 DIAGNOSIS — T7840XA Allergy, unspecified, initial encounter: Secondary | ICD-10-CM | POA: Diagnosis not present

## 2022-07-25 DIAGNOSIS — R52 Pain, unspecified: Secondary | ICD-10-CM | POA: Diagnosis not present

## 2022-07-27 DIAGNOSIS — D631 Anemia in chronic kidney disease: Secondary | ICD-10-CM | POA: Diagnosis not present

## 2022-07-27 DIAGNOSIS — Z992 Dependence on renal dialysis: Secondary | ICD-10-CM | POA: Diagnosis not present

## 2022-07-27 DIAGNOSIS — D509 Iron deficiency anemia, unspecified: Secondary | ICD-10-CM | POA: Diagnosis not present

## 2022-07-27 DIAGNOSIS — D689 Coagulation defect, unspecified: Secondary | ICD-10-CM | POA: Diagnosis not present

## 2022-07-27 DIAGNOSIS — T7840XA Allergy, unspecified, initial encounter: Secondary | ICD-10-CM | POA: Diagnosis not present

## 2022-07-27 DIAGNOSIS — N186 End stage renal disease: Secondary | ICD-10-CM | POA: Diagnosis not present

## 2022-07-27 DIAGNOSIS — R52 Pain, unspecified: Secondary | ICD-10-CM | POA: Diagnosis not present

## 2022-07-27 DIAGNOSIS — N2581 Secondary hyperparathyroidism of renal origin: Secondary | ICD-10-CM | POA: Diagnosis not present

## 2022-07-28 ENCOUNTER — Inpatient Hospital Stay: Payer: Medicare Other | Admitting: Family Medicine

## 2022-07-29 DIAGNOSIS — D509 Iron deficiency anemia, unspecified: Secondary | ICD-10-CM | POA: Diagnosis not present

## 2022-07-29 DIAGNOSIS — R52 Pain, unspecified: Secondary | ICD-10-CM | POA: Diagnosis not present

## 2022-07-29 DIAGNOSIS — N2581 Secondary hyperparathyroidism of renal origin: Secondary | ICD-10-CM | POA: Diagnosis not present

## 2022-07-29 DIAGNOSIS — N186 End stage renal disease: Secondary | ICD-10-CM | POA: Diagnosis not present

## 2022-07-29 DIAGNOSIS — D689 Coagulation defect, unspecified: Secondary | ICD-10-CM | POA: Diagnosis not present

## 2022-07-29 DIAGNOSIS — Z992 Dependence on renal dialysis: Secondary | ICD-10-CM | POA: Diagnosis not present

## 2022-07-29 DIAGNOSIS — D631 Anemia in chronic kidney disease: Secondary | ICD-10-CM | POA: Diagnosis not present

## 2022-07-29 DIAGNOSIS — T7840XA Allergy, unspecified, initial encounter: Secondary | ICD-10-CM | POA: Diagnosis not present

## 2022-08-01 DIAGNOSIS — N2581 Secondary hyperparathyroidism of renal origin: Secondary | ICD-10-CM | POA: Diagnosis not present

## 2022-08-01 DIAGNOSIS — N186 End stage renal disease: Secondary | ICD-10-CM | POA: Diagnosis not present

## 2022-08-01 DIAGNOSIS — Z992 Dependence on renal dialysis: Secondary | ICD-10-CM | POA: Diagnosis not present

## 2022-08-01 DIAGNOSIS — D509 Iron deficiency anemia, unspecified: Secondary | ICD-10-CM | POA: Diagnosis not present

## 2022-08-01 DIAGNOSIS — D631 Anemia in chronic kidney disease: Secondary | ICD-10-CM | POA: Diagnosis not present

## 2022-08-01 DIAGNOSIS — D689 Coagulation defect, unspecified: Secondary | ICD-10-CM | POA: Diagnosis not present

## 2022-08-01 DIAGNOSIS — R52 Pain, unspecified: Secondary | ICD-10-CM | POA: Diagnosis not present

## 2022-08-01 DIAGNOSIS — T7840XA Allergy, unspecified, initial encounter: Secondary | ICD-10-CM | POA: Diagnosis not present

## 2022-08-03 DIAGNOSIS — D689 Coagulation defect, unspecified: Secondary | ICD-10-CM | POA: Diagnosis not present

## 2022-08-03 DIAGNOSIS — D509 Iron deficiency anemia, unspecified: Secondary | ICD-10-CM | POA: Diagnosis not present

## 2022-08-03 DIAGNOSIS — D631 Anemia in chronic kidney disease: Secondary | ICD-10-CM | POA: Diagnosis not present

## 2022-08-03 DIAGNOSIS — N186 End stage renal disease: Secondary | ICD-10-CM | POA: Diagnosis not present

## 2022-08-03 DIAGNOSIS — Z992 Dependence on renal dialysis: Secondary | ICD-10-CM | POA: Diagnosis not present

## 2022-08-03 DIAGNOSIS — T7840XA Allergy, unspecified, initial encounter: Secondary | ICD-10-CM | POA: Diagnosis not present

## 2022-08-03 DIAGNOSIS — R52 Pain, unspecified: Secondary | ICD-10-CM | POA: Diagnosis not present

## 2022-08-03 DIAGNOSIS — N2581 Secondary hyperparathyroidism of renal origin: Secondary | ICD-10-CM | POA: Diagnosis not present

## 2022-08-03 DIAGNOSIS — N138 Other obstructive and reflux uropathy: Secondary | ICD-10-CM | POA: Diagnosis not present

## 2022-08-05 DIAGNOSIS — D631 Anemia in chronic kidney disease: Secondary | ICD-10-CM | POA: Diagnosis not present

## 2022-08-05 DIAGNOSIS — N186 End stage renal disease: Secondary | ICD-10-CM | POA: Diagnosis not present

## 2022-08-05 DIAGNOSIS — T7840XA Allergy, unspecified, initial encounter: Secondary | ICD-10-CM | POA: Diagnosis not present

## 2022-08-05 DIAGNOSIS — Z992 Dependence on renal dialysis: Secondary | ICD-10-CM | POA: Diagnosis not present

## 2022-08-05 DIAGNOSIS — N2581 Secondary hyperparathyroidism of renal origin: Secondary | ICD-10-CM | POA: Diagnosis not present

## 2022-08-05 DIAGNOSIS — R52 Pain, unspecified: Secondary | ICD-10-CM | POA: Diagnosis not present

## 2022-08-05 DIAGNOSIS — D689 Coagulation defect, unspecified: Secondary | ICD-10-CM | POA: Diagnosis not present

## 2022-08-05 DIAGNOSIS — D509 Iron deficiency anemia, unspecified: Secondary | ICD-10-CM | POA: Diagnosis not present

## 2022-08-08 ENCOUNTER — Non-Acute Institutional Stay (HOSPITAL_COMMUNITY)
Admission: RE | Admit: 2022-08-08 | Discharge: 2022-08-08 | Disposition: A | Discharge status: Home / Self Care | Payer: Medicare Other | Source: Ambulatory Visit | Attending: Internal Medicine | Admitting: Internal Medicine

## 2022-08-08 DIAGNOSIS — D689 Coagulation defect, unspecified: Secondary | ICD-10-CM | POA: Diagnosis not present

## 2022-08-08 DIAGNOSIS — T7840XA Allergy, unspecified, initial encounter: Secondary | ICD-10-CM | POA: Diagnosis not present

## 2022-08-08 DIAGNOSIS — D631 Anemia in chronic kidney disease: Secondary | ICD-10-CM | POA: Diagnosis not present

## 2022-08-08 DIAGNOSIS — D649 Anemia, unspecified: Secondary | ICD-10-CM | POA: Diagnosis not present

## 2022-08-08 DIAGNOSIS — N2581 Secondary hyperparathyroidism of renal origin: Secondary | ICD-10-CM | POA: Diagnosis not present

## 2022-08-08 DIAGNOSIS — N186 End stage renal disease: Secondary | ICD-10-CM

## 2022-08-08 DIAGNOSIS — Z992 Dependence on renal dialysis: Secondary | ICD-10-CM | POA: Diagnosis not present

## 2022-08-08 DIAGNOSIS — R52 Pain, unspecified: Secondary | ICD-10-CM | POA: Diagnosis not present

## 2022-08-08 DIAGNOSIS — D509 Iron deficiency anemia, unspecified: Secondary | ICD-10-CM | POA: Diagnosis not present

## 2022-08-08 LAB — CBC
HCT: 22.5 % — ABNORMAL LOW (ref 39.0–52.0)
Hemoglobin: 7.8 g/dL — ABNORMAL LOW (ref 13.0–17.0)
MCH: 28.1 pg (ref 26.0–34.0)
MCHC: 34.7 g/dL (ref 30.0–36.0)
MCV: 80.9 fL (ref 80.0–100.0)
Platelets: 221 10*3/uL (ref 150–400)
RBC: 2.78 MIL/uL — ABNORMAL LOW (ref 4.22–5.81)
RDW: 13.1 % (ref 11.5–15.5)
WBC: 7.5 10*3/uL (ref 4.0–10.5)
nRBC: 0 % (ref 0.0–0.2)

## 2022-08-08 LAB — PREPARE RBC (CROSSMATCH)

## 2022-08-08 NOTE — Progress Notes (Addendum)
PATIENT CARE CENTER NOTE   Patient arrived for lab draw. Labs drawn peripherally by phlebotomist (Type & Screen and CBC). Patient tolerated well. Blue blood band placed on patient's wrist. Patient scheduled to come in tomorrow morning for blood transfusion per Dr. Benjamine Mola Upton's order. Advised patient to keep blue blood band on. Patient expresses an understanding. Alert, oriented and ambulatory at discharge.

## 2022-08-09 ENCOUNTER — Non-Acute Institutional Stay (HOSPITAL_COMMUNITY)
Admission: RE | Admit: 2022-08-09 | Discharge: 2022-08-09 | Disposition: A | Discharge status: Home / Self Care | Payer: Medicare Other | Source: Ambulatory Visit | Attending: Internal Medicine | Admitting: Internal Medicine

## 2022-08-09 VITALS — BP 157/95 | HR 68 | Temp 97.9°F | Resp 16

## 2022-08-09 DIAGNOSIS — D649 Anemia, unspecified: Secondary | ICD-10-CM | POA: Diagnosis not present

## 2022-08-09 DIAGNOSIS — N186 End stage renal disease: Secondary | ICD-10-CM | POA: Diagnosis not present

## 2022-08-09 LAB — PREPARE RBC (CROSSMATCH)

## 2022-08-09 MED ORDER — SODIUM CHLORIDE 0.9% IV SOLUTION: Freq: Once | INTRAVENOUS | Status: AC

## 2022-08-09 NOTE — Progress Notes (Addendum)
PATIENT CARE CENTER NOTE     Diagnosis: Anemia, ESRD     Provider: Madelon Lips, MD     Procedure: Blood transfusion      Note: Patient received 1 unit PRBCs via PIV. Type & Screen and CBC drawn yesterday.  Pre-transfusion Hemoglobin 7.8.  Blood transfusion given over 3 hours.  Patient tolerated transfusion well with no adverse reaction. BP slightly elevated but wnl for patient.  Discharge instructions given. Patient alert, oriented and ambulatory at discharge.

## 2022-08-10 DIAGNOSIS — Z992 Dependence on renal dialysis: Secondary | ICD-10-CM | POA: Diagnosis not present

## 2022-08-10 DIAGNOSIS — N186 End stage renal disease: Secondary | ICD-10-CM | POA: Diagnosis not present

## 2022-08-10 DIAGNOSIS — R52 Pain, unspecified: Secondary | ICD-10-CM | POA: Diagnosis not present

## 2022-08-10 DIAGNOSIS — T7840XA Allergy, unspecified, initial encounter: Secondary | ICD-10-CM | POA: Diagnosis not present

## 2022-08-10 DIAGNOSIS — D509 Iron deficiency anemia, unspecified: Secondary | ICD-10-CM | POA: Diagnosis not present

## 2022-08-10 DIAGNOSIS — D631 Anemia in chronic kidney disease: Secondary | ICD-10-CM | POA: Diagnosis not present

## 2022-08-10 DIAGNOSIS — D689 Coagulation defect, unspecified: Secondary | ICD-10-CM | POA: Diagnosis not present

## 2022-08-10 DIAGNOSIS — N2581 Secondary hyperparathyroidism of renal origin: Secondary | ICD-10-CM | POA: Diagnosis not present

## 2022-08-10 LAB — TYPE AND SCREEN
ABO/RH(D): B POS
Antibody Screen: NEGATIVE
Unit division: 0

## 2022-08-10 LAB — BPAM RBC
Blood Product Expiration Date: 202403052359
ISSUE DATE / TIME: 202402060845
Unit Type and Rh: 7300

## 2022-08-11 ENCOUNTER — Ambulatory Visit: Payer: Medicare Other | Admitting: Family Medicine

## 2022-08-12 DIAGNOSIS — D689 Coagulation defect, unspecified: Secondary | ICD-10-CM | POA: Diagnosis not present

## 2022-08-12 DIAGNOSIS — R52 Pain, unspecified: Secondary | ICD-10-CM | POA: Diagnosis not present

## 2022-08-12 DIAGNOSIS — D509 Iron deficiency anemia, unspecified: Secondary | ICD-10-CM | POA: Diagnosis not present

## 2022-08-12 DIAGNOSIS — Z992 Dependence on renal dialysis: Secondary | ICD-10-CM | POA: Diagnosis not present

## 2022-08-12 DIAGNOSIS — T7840XA Allergy, unspecified, initial encounter: Secondary | ICD-10-CM | POA: Diagnosis not present

## 2022-08-12 DIAGNOSIS — N2581 Secondary hyperparathyroidism of renal origin: Secondary | ICD-10-CM | POA: Diagnosis not present

## 2022-08-12 DIAGNOSIS — N186 End stage renal disease: Secondary | ICD-10-CM | POA: Diagnosis not present

## 2022-08-12 DIAGNOSIS — D631 Anemia in chronic kidney disease: Secondary | ICD-10-CM | POA: Diagnosis not present

## 2022-08-15 DIAGNOSIS — D631 Anemia in chronic kidney disease: Secondary | ICD-10-CM | POA: Diagnosis not present

## 2022-08-15 DIAGNOSIS — D689 Coagulation defect, unspecified: Secondary | ICD-10-CM | POA: Diagnosis not present

## 2022-08-15 DIAGNOSIS — R52 Pain, unspecified: Secondary | ICD-10-CM | POA: Diagnosis not present

## 2022-08-15 DIAGNOSIS — N2581 Secondary hyperparathyroidism of renal origin: Secondary | ICD-10-CM | POA: Diagnosis not present

## 2022-08-15 DIAGNOSIS — Z992 Dependence on renal dialysis: Secondary | ICD-10-CM | POA: Diagnosis not present

## 2022-08-15 DIAGNOSIS — N186 End stage renal disease: Secondary | ICD-10-CM | POA: Diagnosis not present

## 2022-08-15 DIAGNOSIS — D509 Iron deficiency anemia, unspecified: Secondary | ICD-10-CM | POA: Diagnosis not present

## 2022-08-15 DIAGNOSIS — T7840XA Allergy, unspecified, initial encounter: Secondary | ICD-10-CM | POA: Diagnosis not present

## 2022-08-17 DIAGNOSIS — T7840XA Allergy, unspecified, initial encounter: Secondary | ICD-10-CM | POA: Diagnosis not present

## 2022-08-17 DIAGNOSIS — D631 Anemia in chronic kidney disease: Secondary | ICD-10-CM | POA: Diagnosis not present

## 2022-08-17 DIAGNOSIS — D689 Coagulation defect, unspecified: Secondary | ICD-10-CM | POA: Diagnosis not present

## 2022-08-17 DIAGNOSIS — N186 End stage renal disease: Secondary | ICD-10-CM | POA: Diagnosis not present

## 2022-08-17 DIAGNOSIS — D509 Iron deficiency anemia, unspecified: Secondary | ICD-10-CM | POA: Diagnosis not present

## 2022-08-17 DIAGNOSIS — N2581 Secondary hyperparathyroidism of renal origin: Secondary | ICD-10-CM | POA: Diagnosis not present

## 2022-08-17 DIAGNOSIS — Z992 Dependence on renal dialysis: Secondary | ICD-10-CM | POA: Diagnosis not present

## 2022-08-17 DIAGNOSIS — R52 Pain, unspecified: Secondary | ICD-10-CM | POA: Diagnosis not present

## 2022-08-19 DIAGNOSIS — T7840XA Allergy, unspecified, initial encounter: Secondary | ICD-10-CM | POA: Diagnosis not present

## 2022-08-19 DIAGNOSIS — D631 Anemia in chronic kidney disease: Secondary | ICD-10-CM | POA: Diagnosis not present

## 2022-08-19 DIAGNOSIS — N186 End stage renal disease: Secondary | ICD-10-CM | POA: Diagnosis not present

## 2022-08-19 DIAGNOSIS — Z992 Dependence on renal dialysis: Secondary | ICD-10-CM | POA: Diagnosis not present

## 2022-08-19 DIAGNOSIS — R52 Pain, unspecified: Secondary | ICD-10-CM | POA: Diagnosis not present

## 2022-08-19 DIAGNOSIS — D689 Coagulation defect, unspecified: Secondary | ICD-10-CM | POA: Diagnosis not present

## 2022-08-19 DIAGNOSIS — D509 Iron deficiency anemia, unspecified: Secondary | ICD-10-CM | POA: Diagnosis not present

## 2022-08-19 DIAGNOSIS — N2581 Secondary hyperparathyroidism of renal origin: Secondary | ICD-10-CM | POA: Diagnosis not present

## 2022-08-22 ENCOUNTER — Encounter: Payer: Self-pay | Admitting: Family Medicine

## 2022-08-22 DIAGNOSIS — N186 End stage renal disease: Secondary | ICD-10-CM | POA: Diagnosis not present

## 2022-08-22 DIAGNOSIS — T7840XA Allergy, unspecified, initial encounter: Secondary | ICD-10-CM | POA: Diagnosis not present

## 2022-08-22 DIAGNOSIS — R52 Pain, unspecified: Secondary | ICD-10-CM | POA: Diagnosis not present

## 2022-08-22 DIAGNOSIS — D631 Anemia in chronic kidney disease: Secondary | ICD-10-CM | POA: Diagnosis not present

## 2022-08-22 DIAGNOSIS — D689 Coagulation defect, unspecified: Secondary | ICD-10-CM | POA: Diagnosis not present

## 2022-08-22 DIAGNOSIS — D509 Iron deficiency anemia, unspecified: Secondary | ICD-10-CM | POA: Diagnosis not present

## 2022-08-22 DIAGNOSIS — Z992 Dependence on renal dialysis: Secondary | ICD-10-CM | POA: Diagnosis not present

## 2022-08-22 DIAGNOSIS — N2581 Secondary hyperparathyroidism of renal origin: Secondary | ICD-10-CM | POA: Diagnosis not present

## 2022-08-24 DIAGNOSIS — D689 Coagulation defect, unspecified: Secondary | ICD-10-CM | POA: Diagnosis not present

## 2022-08-24 DIAGNOSIS — N2581 Secondary hyperparathyroidism of renal origin: Secondary | ICD-10-CM | POA: Diagnosis not present

## 2022-08-24 DIAGNOSIS — Z992 Dependence on renal dialysis: Secondary | ICD-10-CM | POA: Diagnosis not present

## 2022-08-24 DIAGNOSIS — T7840XA Allergy, unspecified, initial encounter: Secondary | ICD-10-CM | POA: Diagnosis not present

## 2022-08-24 DIAGNOSIS — D631 Anemia in chronic kidney disease: Secondary | ICD-10-CM | POA: Diagnosis not present

## 2022-08-24 DIAGNOSIS — R52 Pain, unspecified: Secondary | ICD-10-CM | POA: Diagnosis not present

## 2022-08-24 DIAGNOSIS — D509 Iron deficiency anemia, unspecified: Secondary | ICD-10-CM | POA: Diagnosis not present

## 2022-08-24 DIAGNOSIS — N186 End stage renal disease: Secondary | ICD-10-CM | POA: Diagnosis not present

## 2022-08-26 DIAGNOSIS — D631 Anemia in chronic kidney disease: Secondary | ICD-10-CM | POA: Diagnosis not present

## 2022-08-26 DIAGNOSIS — D509 Iron deficiency anemia, unspecified: Secondary | ICD-10-CM | POA: Diagnosis not present

## 2022-08-26 DIAGNOSIS — Z992 Dependence on renal dialysis: Secondary | ICD-10-CM | POA: Diagnosis not present

## 2022-08-26 DIAGNOSIS — N2581 Secondary hyperparathyroidism of renal origin: Secondary | ICD-10-CM | POA: Diagnosis not present

## 2022-08-26 DIAGNOSIS — R52 Pain, unspecified: Secondary | ICD-10-CM | POA: Diagnosis not present

## 2022-08-26 DIAGNOSIS — N186 End stage renal disease: Secondary | ICD-10-CM | POA: Diagnosis not present

## 2022-08-26 DIAGNOSIS — T7840XA Allergy, unspecified, initial encounter: Secondary | ICD-10-CM | POA: Diagnosis not present

## 2022-08-26 DIAGNOSIS — D689 Coagulation defect, unspecified: Secondary | ICD-10-CM | POA: Diagnosis not present

## 2022-08-29 DIAGNOSIS — R52 Pain, unspecified: Secondary | ICD-10-CM | POA: Diagnosis not present

## 2022-08-29 DIAGNOSIS — D689 Coagulation defect, unspecified: Secondary | ICD-10-CM | POA: Diagnosis not present

## 2022-08-29 DIAGNOSIS — T7840XA Allergy, unspecified, initial encounter: Secondary | ICD-10-CM | POA: Diagnosis not present

## 2022-08-29 DIAGNOSIS — Z992 Dependence on renal dialysis: Secondary | ICD-10-CM | POA: Diagnosis not present

## 2022-08-29 DIAGNOSIS — N186 End stage renal disease: Secondary | ICD-10-CM | POA: Diagnosis not present

## 2022-08-29 DIAGNOSIS — N2581 Secondary hyperparathyroidism of renal origin: Secondary | ICD-10-CM | POA: Diagnosis not present

## 2022-08-29 DIAGNOSIS — D509 Iron deficiency anemia, unspecified: Secondary | ICD-10-CM | POA: Diagnosis not present

## 2022-08-29 DIAGNOSIS — D631 Anemia in chronic kidney disease: Secondary | ICD-10-CM | POA: Diagnosis not present

## 2022-08-30 ENCOUNTER — Encounter (HOSPITAL_COMMUNITY): Payer: Self-pay

## 2022-08-31 DIAGNOSIS — Z992 Dependence on renal dialysis: Secondary | ICD-10-CM | POA: Diagnosis not present

## 2022-08-31 DIAGNOSIS — D631 Anemia in chronic kidney disease: Secondary | ICD-10-CM | POA: Diagnosis not present

## 2022-08-31 DIAGNOSIS — N2581 Secondary hyperparathyroidism of renal origin: Secondary | ICD-10-CM | POA: Diagnosis not present

## 2022-08-31 DIAGNOSIS — D509 Iron deficiency anemia, unspecified: Secondary | ICD-10-CM | POA: Diagnosis not present

## 2022-08-31 DIAGNOSIS — R52 Pain, unspecified: Secondary | ICD-10-CM | POA: Diagnosis not present

## 2022-08-31 DIAGNOSIS — T7840XA Allergy, unspecified, initial encounter: Secondary | ICD-10-CM | POA: Diagnosis not present

## 2022-08-31 DIAGNOSIS — N186 End stage renal disease: Secondary | ICD-10-CM | POA: Diagnosis not present

## 2022-08-31 DIAGNOSIS — D689 Coagulation defect, unspecified: Secondary | ICD-10-CM | POA: Diagnosis not present

## 2022-09-01 ENCOUNTER — Encounter (HOSPITAL_COMMUNITY): Payer: Medicare Other

## 2022-09-01 ENCOUNTER — Non-Acute Institutional Stay (HOSPITAL_COMMUNITY)
Admission: RE | Admit: 2022-09-01 | Discharge: 2022-09-01 | Disposition: A | Discharge status: Home / Self Care | Payer: Medicare Other | Source: Ambulatory Visit | Attending: Internal Medicine | Admitting: Internal Medicine

## 2022-09-01 VITALS — BP 179/98 | HR 75 | Temp 97.9°F | Resp 18

## 2022-09-01 DIAGNOSIS — Z992 Dependence on renal dialysis: Secondary | ICD-10-CM | POA: Insufficient documentation

## 2022-09-01 DIAGNOSIS — D631 Anemia in chronic kidney disease: Secondary | ICD-10-CM | POA: Insufficient documentation

## 2022-09-01 DIAGNOSIS — D649 Anemia, unspecified: Secondary | ICD-10-CM

## 2022-09-01 DIAGNOSIS — N186 End stage renal disease: Secondary | ICD-10-CM | POA: Insufficient documentation

## 2022-09-01 DIAGNOSIS — N138 Other obstructive and reflux uropathy: Secondary | ICD-10-CM | POA: Diagnosis not present

## 2022-09-01 LAB — HEMOGLOBIN AND HEMATOCRIT, BLOOD
HCT: 21.8 % — ABNORMAL LOW (ref 39.0–52.0)
Hemoglobin: 7.3 g/dL — ABNORMAL LOW (ref 13.0–17.0)

## 2022-09-01 LAB — PREPARE RBC (CROSSMATCH)

## 2022-09-01 MED ORDER — SODIUM CHLORIDE 0.9% IV SOLUTION: Freq: Once | INTRAVENOUS | Status: AC

## 2022-09-01 NOTE — Progress Notes (Signed)
PATIENT CARE CENTER NOTE     Diagnosis: Symptomatic Anemia      Provider: Madelon Lips, MD     Procedure: Blood transfusion      Note: Patient received 1 unit PRBCs via PIV. Type & Screen and H&H drawn prior to infusion. Pre-transfusion Hemoglobin 7.3.  Blood transfusion given over 3 hours.  Patient tolerated transfusion well with no adverse reaction. BP slightly elevated but wnl for patient. AVS offered but patient refused. Patient alert, oriented and ambulatory at discharge.

## 2022-09-02 DIAGNOSIS — N186 End stage renal disease: Secondary | ICD-10-CM | POA: Diagnosis not present

## 2022-09-02 DIAGNOSIS — Z992 Dependence on renal dialysis: Secondary | ICD-10-CM | POA: Diagnosis not present

## 2022-09-02 DIAGNOSIS — D631 Anemia in chronic kidney disease: Secondary | ICD-10-CM | POA: Diagnosis not present

## 2022-09-02 DIAGNOSIS — R52 Pain, unspecified: Secondary | ICD-10-CM | POA: Diagnosis not present

## 2022-09-02 DIAGNOSIS — N2581 Secondary hyperparathyroidism of renal origin: Secondary | ICD-10-CM | POA: Diagnosis not present

## 2022-09-02 DIAGNOSIS — T7840XA Allergy, unspecified, initial encounter: Secondary | ICD-10-CM | POA: Diagnosis not present

## 2022-09-02 DIAGNOSIS — D689 Coagulation defect, unspecified: Secondary | ICD-10-CM | POA: Diagnosis not present

## 2022-09-02 DIAGNOSIS — D509 Iron deficiency anemia, unspecified: Secondary | ICD-10-CM | POA: Diagnosis not present

## 2022-09-02 LAB — TYPE AND SCREEN
ABO/RH(D): B POS
Antibody Screen: NEGATIVE
Unit division: 0

## 2022-09-02 LAB — BPAM RBC
Blood Product Expiration Date: 202403132359
ISSUE DATE / TIME: 202402291124
Unit Type and Rh: 7300

## 2022-09-05 DIAGNOSIS — D689 Coagulation defect, unspecified: Secondary | ICD-10-CM | POA: Diagnosis not present

## 2022-09-05 DIAGNOSIS — D509 Iron deficiency anemia, unspecified: Secondary | ICD-10-CM | POA: Diagnosis not present

## 2022-09-05 DIAGNOSIS — Z992 Dependence on renal dialysis: Secondary | ICD-10-CM | POA: Diagnosis not present

## 2022-09-05 DIAGNOSIS — N2581 Secondary hyperparathyroidism of renal origin: Secondary | ICD-10-CM | POA: Diagnosis not present

## 2022-09-05 DIAGNOSIS — N186 End stage renal disease: Secondary | ICD-10-CM | POA: Diagnosis not present

## 2022-09-05 DIAGNOSIS — D631 Anemia in chronic kidney disease: Secondary | ICD-10-CM | POA: Diagnosis not present

## 2022-09-05 DIAGNOSIS — R52 Pain, unspecified: Secondary | ICD-10-CM | POA: Diagnosis not present

## 2022-09-05 DIAGNOSIS — T7840XA Allergy, unspecified, initial encounter: Secondary | ICD-10-CM | POA: Diagnosis not present

## 2022-09-07 DIAGNOSIS — D509 Iron deficiency anemia, unspecified: Secondary | ICD-10-CM | POA: Diagnosis not present

## 2022-09-07 DIAGNOSIS — T7840XA Allergy, unspecified, initial encounter: Secondary | ICD-10-CM | POA: Diagnosis not present

## 2022-09-07 DIAGNOSIS — D689 Coagulation defect, unspecified: Secondary | ICD-10-CM | POA: Diagnosis not present

## 2022-09-07 DIAGNOSIS — Z992 Dependence on renal dialysis: Secondary | ICD-10-CM | POA: Diagnosis not present

## 2022-09-07 DIAGNOSIS — D631 Anemia in chronic kidney disease: Secondary | ICD-10-CM | POA: Diagnosis not present

## 2022-09-07 DIAGNOSIS — N2581 Secondary hyperparathyroidism of renal origin: Secondary | ICD-10-CM | POA: Diagnosis not present

## 2022-09-07 DIAGNOSIS — N186 End stage renal disease: Secondary | ICD-10-CM | POA: Diagnosis not present

## 2022-09-07 DIAGNOSIS — R52 Pain, unspecified: Secondary | ICD-10-CM | POA: Diagnosis not present

## 2022-09-09 DIAGNOSIS — D631 Anemia in chronic kidney disease: Secondary | ICD-10-CM | POA: Diagnosis not present

## 2022-09-09 DIAGNOSIS — Z992 Dependence on renal dialysis: Secondary | ICD-10-CM | POA: Diagnosis not present

## 2022-09-09 DIAGNOSIS — D689 Coagulation defect, unspecified: Secondary | ICD-10-CM | POA: Diagnosis not present

## 2022-09-09 DIAGNOSIS — N2581 Secondary hyperparathyroidism of renal origin: Secondary | ICD-10-CM | POA: Diagnosis not present

## 2022-09-09 DIAGNOSIS — T7840XA Allergy, unspecified, initial encounter: Secondary | ICD-10-CM | POA: Diagnosis not present

## 2022-09-09 DIAGNOSIS — D509 Iron deficiency anemia, unspecified: Secondary | ICD-10-CM | POA: Diagnosis not present

## 2022-09-09 DIAGNOSIS — R52 Pain, unspecified: Secondary | ICD-10-CM | POA: Diagnosis not present

## 2022-09-09 DIAGNOSIS — N186 End stage renal disease: Secondary | ICD-10-CM | POA: Diagnosis not present

## 2022-09-12 ENCOUNTER — Encounter (HOSPITAL_COMMUNITY): Payer: Medicare Other

## 2022-09-12 DIAGNOSIS — D689 Coagulation defect, unspecified: Secondary | ICD-10-CM | POA: Diagnosis not present

## 2022-09-12 DIAGNOSIS — D631 Anemia in chronic kidney disease: Secondary | ICD-10-CM | POA: Diagnosis not present

## 2022-09-12 DIAGNOSIS — D509 Iron deficiency anemia, unspecified: Secondary | ICD-10-CM | POA: Diagnosis not present

## 2022-09-12 DIAGNOSIS — N186 End stage renal disease: Secondary | ICD-10-CM | POA: Diagnosis not present

## 2022-09-12 DIAGNOSIS — Z992 Dependence on renal dialysis: Secondary | ICD-10-CM | POA: Diagnosis not present

## 2022-09-12 DIAGNOSIS — T7840XA Allergy, unspecified, initial encounter: Secondary | ICD-10-CM | POA: Diagnosis not present

## 2022-09-12 DIAGNOSIS — R52 Pain, unspecified: Secondary | ICD-10-CM | POA: Diagnosis not present

## 2022-09-12 DIAGNOSIS — N2581 Secondary hyperparathyroidism of renal origin: Secondary | ICD-10-CM | POA: Diagnosis not present

## 2022-09-14 DIAGNOSIS — R52 Pain, unspecified: Secondary | ICD-10-CM | POA: Diagnosis not present

## 2022-09-14 DIAGNOSIS — D509 Iron deficiency anemia, unspecified: Secondary | ICD-10-CM | POA: Diagnosis not present

## 2022-09-14 DIAGNOSIS — D631 Anemia in chronic kidney disease: Secondary | ICD-10-CM | POA: Diagnosis not present

## 2022-09-14 DIAGNOSIS — N186 End stage renal disease: Secondary | ICD-10-CM | POA: Diagnosis not present

## 2022-09-14 DIAGNOSIS — T7840XA Allergy, unspecified, initial encounter: Secondary | ICD-10-CM | POA: Diagnosis not present

## 2022-09-14 DIAGNOSIS — D689 Coagulation defect, unspecified: Secondary | ICD-10-CM | POA: Diagnosis not present

## 2022-09-14 DIAGNOSIS — N2581 Secondary hyperparathyroidism of renal origin: Secondary | ICD-10-CM | POA: Diagnosis not present

## 2022-09-14 DIAGNOSIS — Z992 Dependence on renal dialysis: Secondary | ICD-10-CM | POA: Diagnosis not present

## 2022-09-16 DIAGNOSIS — R52 Pain, unspecified: Secondary | ICD-10-CM | POA: Diagnosis not present

## 2022-09-16 DIAGNOSIS — D509 Iron deficiency anemia, unspecified: Secondary | ICD-10-CM | POA: Diagnosis not present

## 2022-09-16 DIAGNOSIS — T7840XA Allergy, unspecified, initial encounter: Secondary | ICD-10-CM | POA: Diagnosis not present

## 2022-09-16 DIAGNOSIS — D689 Coagulation defect, unspecified: Secondary | ICD-10-CM | POA: Diagnosis not present

## 2022-09-16 DIAGNOSIS — N2581 Secondary hyperparathyroidism of renal origin: Secondary | ICD-10-CM | POA: Diagnosis not present

## 2022-09-16 DIAGNOSIS — D631 Anemia in chronic kidney disease: Secondary | ICD-10-CM | POA: Diagnosis not present

## 2022-09-16 DIAGNOSIS — N186 End stage renal disease: Secondary | ICD-10-CM | POA: Diagnosis not present

## 2022-09-16 DIAGNOSIS — Z992 Dependence on renal dialysis: Secondary | ICD-10-CM | POA: Diagnosis not present

## 2022-09-19 DIAGNOSIS — D631 Anemia in chronic kidney disease: Secondary | ICD-10-CM | POA: Diagnosis not present

## 2022-09-19 DIAGNOSIS — T7840XA Allergy, unspecified, initial encounter: Secondary | ICD-10-CM | POA: Diagnosis not present

## 2022-09-19 DIAGNOSIS — Z992 Dependence on renal dialysis: Secondary | ICD-10-CM | POA: Diagnosis not present

## 2022-09-19 DIAGNOSIS — N186 End stage renal disease: Secondary | ICD-10-CM | POA: Diagnosis not present

## 2022-09-19 DIAGNOSIS — N2581 Secondary hyperparathyroidism of renal origin: Secondary | ICD-10-CM | POA: Diagnosis not present

## 2022-09-19 DIAGNOSIS — R52 Pain, unspecified: Secondary | ICD-10-CM | POA: Diagnosis not present

## 2022-09-19 DIAGNOSIS — D689 Coagulation defect, unspecified: Secondary | ICD-10-CM | POA: Diagnosis not present

## 2022-09-19 DIAGNOSIS — D509 Iron deficiency anemia, unspecified: Secondary | ICD-10-CM | POA: Diagnosis not present

## 2022-09-21 DIAGNOSIS — R52 Pain, unspecified: Secondary | ICD-10-CM | POA: Diagnosis not present

## 2022-09-21 DIAGNOSIS — Z992 Dependence on renal dialysis: Secondary | ICD-10-CM | POA: Diagnosis not present

## 2022-09-21 DIAGNOSIS — D509 Iron deficiency anemia, unspecified: Secondary | ICD-10-CM | POA: Diagnosis not present

## 2022-09-21 DIAGNOSIS — T7840XA Allergy, unspecified, initial encounter: Secondary | ICD-10-CM | POA: Diagnosis not present

## 2022-09-21 DIAGNOSIS — N2581 Secondary hyperparathyroidism of renal origin: Secondary | ICD-10-CM | POA: Diagnosis not present

## 2022-09-21 DIAGNOSIS — N186 End stage renal disease: Secondary | ICD-10-CM | POA: Diagnosis not present

## 2022-09-21 DIAGNOSIS — D631 Anemia in chronic kidney disease: Secondary | ICD-10-CM | POA: Diagnosis not present

## 2022-09-21 DIAGNOSIS — D689 Coagulation defect, unspecified: Secondary | ICD-10-CM | POA: Diagnosis not present

## 2022-09-22 DIAGNOSIS — N186 End stage renal disease: Secondary | ICD-10-CM | POA: Diagnosis not present

## 2022-09-22 DIAGNOSIS — N130 Hydronephrosis with ureteropelvic junction obstruction: Secondary | ICD-10-CM | POA: Diagnosis not present

## 2022-09-22 DIAGNOSIS — C7951 Secondary malignant neoplasm of bone: Secondary | ICD-10-CM | POA: Diagnosis not present

## 2022-09-23 DIAGNOSIS — Z992 Dependence on renal dialysis: Secondary | ICD-10-CM | POA: Diagnosis not present

## 2022-09-23 DIAGNOSIS — D689 Coagulation defect, unspecified: Secondary | ICD-10-CM | POA: Diagnosis not present

## 2022-09-23 DIAGNOSIS — T7840XA Allergy, unspecified, initial encounter: Secondary | ICD-10-CM | POA: Diagnosis not present

## 2022-09-23 DIAGNOSIS — R52 Pain, unspecified: Secondary | ICD-10-CM | POA: Diagnosis not present

## 2022-09-23 DIAGNOSIS — D631 Anemia in chronic kidney disease: Secondary | ICD-10-CM | POA: Diagnosis not present

## 2022-09-23 DIAGNOSIS — N186 End stage renal disease: Secondary | ICD-10-CM | POA: Diagnosis not present

## 2022-09-23 DIAGNOSIS — N2581 Secondary hyperparathyroidism of renal origin: Secondary | ICD-10-CM | POA: Diagnosis not present

## 2022-09-23 DIAGNOSIS — D509 Iron deficiency anemia, unspecified: Secondary | ICD-10-CM | POA: Diagnosis not present

## 2022-09-26 DIAGNOSIS — D689 Coagulation defect, unspecified: Secondary | ICD-10-CM | POA: Diagnosis not present

## 2022-09-26 DIAGNOSIS — D509 Iron deficiency anemia, unspecified: Secondary | ICD-10-CM | POA: Diagnosis not present

## 2022-09-26 DIAGNOSIS — Z992 Dependence on renal dialysis: Secondary | ICD-10-CM | POA: Diagnosis not present

## 2022-09-26 DIAGNOSIS — D631 Anemia in chronic kidney disease: Secondary | ICD-10-CM | POA: Diagnosis not present

## 2022-09-26 DIAGNOSIS — N186 End stage renal disease: Secondary | ICD-10-CM | POA: Diagnosis not present

## 2022-09-26 DIAGNOSIS — N2581 Secondary hyperparathyroidism of renal origin: Secondary | ICD-10-CM | POA: Diagnosis not present

## 2022-09-26 DIAGNOSIS — R52 Pain, unspecified: Secondary | ICD-10-CM | POA: Diagnosis not present

## 2022-09-26 DIAGNOSIS — T7840XA Allergy, unspecified, initial encounter: Secondary | ICD-10-CM | POA: Diagnosis not present

## 2022-09-28 DIAGNOSIS — R52 Pain, unspecified: Secondary | ICD-10-CM | POA: Diagnosis not present

## 2022-09-28 DIAGNOSIS — N186 End stage renal disease: Secondary | ICD-10-CM | POA: Diagnosis not present

## 2022-09-28 DIAGNOSIS — D689 Coagulation defect, unspecified: Secondary | ICD-10-CM | POA: Diagnosis not present

## 2022-09-28 DIAGNOSIS — D509 Iron deficiency anemia, unspecified: Secondary | ICD-10-CM | POA: Diagnosis not present

## 2022-09-28 DIAGNOSIS — N2581 Secondary hyperparathyroidism of renal origin: Secondary | ICD-10-CM | POA: Diagnosis not present

## 2022-09-28 DIAGNOSIS — Z992 Dependence on renal dialysis: Secondary | ICD-10-CM | POA: Diagnosis not present

## 2022-09-28 DIAGNOSIS — D631 Anemia in chronic kidney disease: Secondary | ICD-10-CM | POA: Diagnosis not present

## 2022-09-28 DIAGNOSIS — T7840XA Allergy, unspecified, initial encounter: Secondary | ICD-10-CM | POA: Diagnosis not present

## 2022-09-30 DIAGNOSIS — N2581 Secondary hyperparathyroidism of renal origin: Secondary | ICD-10-CM | POA: Diagnosis not present

## 2022-09-30 DIAGNOSIS — R52 Pain, unspecified: Secondary | ICD-10-CM | POA: Diagnosis not present

## 2022-09-30 DIAGNOSIS — Z992 Dependence on renal dialysis: Secondary | ICD-10-CM | POA: Diagnosis not present

## 2022-09-30 DIAGNOSIS — D631 Anemia in chronic kidney disease: Secondary | ICD-10-CM | POA: Diagnosis not present

## 2022-09-30 DIAGNOSIS — N186 End stage renal disease: Secondary | ICD-10-CM | POA: Diagnosis not present

## 2022-09-30 DIAGNOSIS — D509 Iron deficiency anemia, unspecified: Secondary | ICD-10-CM | POA: Diagnosis not present

## 2022-09-30 DIAGNOSIS — D689 Coagulation defect, unspecified: Secondary | ICD-10-CM | POA: Diagnosis not present

## 2022-09-30 DIAGNOSIS — T7840XA Allergy, unspecified, initial encounter: Secondary | ICD-10-CM | POA: Diagnosis not present

## 2022-10-02 DIAGNOSIS — N138 Other obstructive and reflux uropathy: Secondary | ICD-10-CM | POA: Diagnosis not present

## 2022-10-02 DIAGNOSIS — Z992 Dependence on renal dialysis: Secondary | ICD-10-CM | POA: Diagnosis not present

## 2022-10-02 DIAGNOSIS — N186 End stage renal disease: Secondary | ICD-10-CM | POA: Diagnosis not present

## 2022-10-03 DIAGNOSIS — D689 Coagulation defect, unspecified: Secondary | ICD-10-CM | POA: Diagnosis not present

## 2022-10-03 DIAGNOSIS — T7840XA Allergy, unspecified, initial encounter: Secondary | ICD-10-CM | POA: Diagnosis not present

## 2022-10-03 DIAGNOSIS — R52 Pain, unspecified: Secondary | ICD-10-CM | POA: Diagnosis not present

## 2022-10-03 DIAGNOSIS — Z992 Dependence on renal dialysis: Secondary | ICD-10-CM | POA: Diagnosis not present

## 2022-10-03 DIAGNOSIS — D631 Anemia in chronic kidney disease: Secondary | ICD-10-CM | POA: Diagnosis not present

## 2022-10-03 DIAGNOSIS — N2581 Secondary hyperparathyroidism of renal origin: Secondary | ICD-10-CM | POA: Diagnosis not present

## 2022-10-03 DIAGNOSIS — D509 Iron deficiency anemia, unspecified: Secondary | ICD-10-CM | POA: Diagnosis not present

## 2022-10-03 DIAGNOSIS — N186 End stage renal disease: Secondary | ICD-10-CM | POA: Diagnosis not present

## 2022-10-05 DIAGNOSIS — R52 Pain, unspecified: Secondary | ICD-10-CM | POA: Diagnosis not present

## 2022-10-05 DIAGNOSIS — Z992 Dependence on renal dialysis: Secondary | ICD-10-CM | POA: Diagnosis not present

## 2022-10-05 DIAGNOSIS — N186 End stage renal disease: Secondary | ICD-10-CM | POA: Diagnosis not present

## 2022-10-05 DIAGNOSIS — N2581 Secondary hyperparathyroidism of renal origin: Secondary | ICD-10-CM | POA: Diagnosis not present

## 2022-10-05 DIAGNOSIS — D631 Anemia in chronic kidney disease: Secondary | ICD-10-CM | POA: Diagnosis not present

## 2022-10-05 DIAGNOSIS — D509 Iron deficiency anemia, unspecified: Secondary | ICD-10-CM | POA: Diagnosis not present

## 2022-10-05 DIAGNOSIS — D689 Coagulation defect, unspecified: Secondary | ICD-10-CM | POA: Diagnosis not present

## 2022-10-05 DIAGNOSIS — T7840XA Allergy, unspecified, initial encounter: Secondary | ICD-10-CM | POA: Diagnosis not present

## 2022-10-07 DIAGNOSIS — D689 Coagulation defect, unspecified: Secondary | ICD-10-CM | POA: Diagnosis not present

## 2022-10-07 DIAGNOSIS — R52 Pain, unspecified: Secondary | ICD-10-CM | POA: Diagnosis not present

## 2022-10-07 DIAGNOSIS — D631 Anemia in chronic kidney disease: Secondary | ICD-10-CM | POA: Diagnosis not present

## 2022-10-07 DIAGNOSIS — D509 Iron deficiency anemia, unspecified: Secondary | ICD-10-CM | POA: Diagnosis not present

## 2022-10-07 DIAGNOSIS — N2581 Secondary hyperparathyroidism of renal origin: Secondary | ICD-10-CM | POA: Diagnosis not present

## 2022-10-07 DIAGNOSIS — N186 End stage renal disease: Secondary | ICD-10-CM | POA: Diagnosis not present

## 2022-10-07 DIAGNOSIS — Z992 Dependence on renal dialysis: Secondary | ICD-10-CM | POA: Diagnosis not present

## 2022-10-07 DIAGNOSIS — T7840XA Allergy, unspecified, initial encounter: Secondary | ICD-10-CM | POA: Diagnosis not present

## 2022-10-10 ENCOUNTER — Non-Acute Institutional Stay (HOSPITAL_COMMUNITY)
Admission: RE | Admit: 2022-10-10 | Discharge: 2022-10-10 | Disposition: A | Discharge status: Home / Self Care | Payer: Medicare Other | Source: Ambulatory Visit | Attending: Internal Medicine | Admitting: Internal Medicine

## 2022-10-10 DIAGNOSIS — D630 Anemia in neoplastic disease: Secondary | ICD-10-CM

## 2022-10-10 DIAGNOSIS — Z992 Dependence on renal dialysis: Secondary | ICD-10-CM | POA: Diagnosis not present

## 2022-10-10 DIAGNOSIS — N186 End stage renal disease: Secondary | ICD-10-CM | POA: Diagnosis not present

## 2022-10-10 DIAGNOSIS — D689 Coagulation defect, unspecified: Secondary | ICD-10-CM | POA: Diagnosis not present

## 2022-10-10 DIAGNOSIS — D631 Anemia in chronic kidney disease: Secondary | ICD-10-CM | POA: Diagnosis not present

## 2022-10-10 DIAGNOSIS — R52 Pain, unspecified: Secondary | ICD-10-CM | POA: Diagnosis not present

## 2022-10-10 DIAGNOSIS — T7840XA Allergy, unspecified, initial encounter: Secondary | ICD-10-CM | POA: Diagnosis not present

## 2022-10-10 DIAGNOSIS — N2581 Secondary hyperparathyroidism of renal origin: Secondary | ICD-10-CM | POA: Diagnosis not present

## 2022-10-10 DIAGNOSIS — D509 Iron deficiency anemia, unspecified: Secondary | ICD-10-CM | POA: Diagnosis not present

## 2022-10-10 LAB — TYPE AND SCREEN
ABO/RH(D): B POS
Antibody Screen: NEGATIVE

## 2022-10-10 LAB — PREPARE RBC (CROSSMATCH)

## 2022-10-10 LAB — BPAM RBC: Blood Product Expiration Date: 202404272359

## 2022-10-10 MED ORDER — SODIUM CHLORIDE 0.9% IV SOLUTION: Freq: Once | INTRAVENOUS | Status: DC

## 2022-10-10 NOTE — Progress Notes (Signed)
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 Type and Screen drawn by phlebotomist. Patient tolerated well. Patient given blue blood bank band and advised to keep it on until he receives his transfusion. Patient to come back tomorrow morning for 1 unit PRBC. Patient alert, oriented and ambulatory at discharge.

## 2022-10-11 ENCOUNTER — Non-Acute Institutional Stay (HOSPITAL_COMMUNITY)
Admission: RE | Admit: 2022-10-11 | Discharge: 2022-10-11 | Disposition: A | Discharge status: Home / Self Care | Payer: Medicare Other | Source: Ambulatory Visit | Attending: Internal Medicine | Admitting: Internal Medicine

## 2022-10-11 DIAGNOSIS — D631 Anemia in chronic kidney disease: Secondary | ICD-10-CM | POA: Diagnosis not present

## 2022-10-11 DIAGNOSIS — N189 Chronic kidney disease, unspecified: Secondary | ICD-10-CM | POA: Diagnosis not present

## 2022-10-11 DIAGNOSIS — D630 Anemia in neoplastic disease: Secondary | ICD-10-CM | POA: Insufficient documentation

## 2022-10-11 DIAGNOSIS — D499 Neoplasm of unspecified behavior of unspecified site: Secondary | ICD-10-CM | POA: Diagnosis not present

## 2022-10-11 LAB — TYPE AND SCREEN

## 2022-10-11 LAB — BPAM RBC: Blood Product Expiration Date: 202404272359

## 2022-10-11 MED ORDER — SODIUM CHLORIDE 0.9% IV SOLUTION: Freq: Once | INTRAVENOUS | Status: AC

## 2022-10-11 NOTE — Progress Notes (Signed)
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 No pre-medications ordered. Patient tolerated infusion well with no adverse reaction. Vital signs wnl for patient. AVS offered but patient refused. Patient alert, oriented and ambulatory at discharge.

## 2022-10-12 DIAGNOSIS — D631 Anemia in chronic kidney disease: Secondary | ICD-10-CM | POA: Diagnosis not present

## 2022-10-12 DIAGNOSIS — D509 Iron deficiency anemia, unspecified: Secondary | ICD-10-CM | POA: Diagnosis not present

## 2022-10-12 DIAGNOSIS — Z992 Dependence on renal dialysis: Secondary | ICD-10-CM | POA: Diagnosis not present

## 2022-10-12 DIAGNOSIS — R52 Pain, unspecified: Secondary | ICD-10-CM | POA: Diagnosis not present

## 2022-10-12 DIAGNOSIS — N186 End stage renal disease: Secondary | ICD-10-CM | POA: Diagnosis not present

## 2022-10-12 DIAGNOSIS — T7840XA Allergy, unspecified, initial encounter: Secondary | ICD-10-CM | POA: Diagnosis not present

## 2022-10-12 DIAGNOSIS — D689 Coagulation defect, unspecified: Secondary | ICD-10-CM | POA: Diagnosis not present

## 2022-10-12 DIAGNOSIS — N2581 Secondary hyperparathyroidism of renal origin: Secondary | ICD-10-CM | POA: Diagnosis not present

## 2022-10-12 LAB — TYPE AND SCREEN: Unit division: 0

## 2022-10-12 LAB — BPAM RBC: ISSUE DATE / TIME: 202404090902

## 2022-10-14 DIAGNOSIS — T7840XA Allergy, unspecified, initial encounter: Secondary | ICD-10-CM | POA: Diagnosis not present

## 2022-10-14 DIAGNOSIS — Z992 Dependence on renal dialysis: Secondary | ICD-10-CM | POA: Diagnosis not present

## 2022-10-14 DIAGNOSIS — R52 Pain, unspecified: Secondary | ICD-10-CM | POA: Diagnosis not present

## 2022-10-14 DIAGNOSIS — D509 Iron deficiency anemia, unspecified: Secondary | ICD-10-CM | POA: Diagnosis not present

## 2022-10-14 DIAGNOSIS — D631 Anemia in chronic kidney disease: Secondary | ICD-10-CM | POA: Diagnosis not present

## 2022-10-14 DIAGNOSIS — D689 Coagulation defect, unspecified: Secondary | ICD-10-CM | POA: Diagnosis not present

## 2022-10-14 DIAGNOSIS — N186 End stage renal disease: Secondary | ICD-10-CM | POA: Diagnosis not present

## 2022-10-14 DIAGNOSIS — N2581 Secondary hyperparathyroidism of renal origin: Secondary | ICD-10-CM | POA: Diagnosis not present

## 2022-10-17 DIAGNOSIS — T7840XA Allergy, unspecified, initial encounter: Secondary | ICD-10-CM | POA: Diagnosis not present

## 2022-10-17 DIAGNOSIS — Z992 Dependence on renal dialysis: Secondary | ICD-10-CM | POA: Diagnosis not present

## 2022-10-17 DIAGNOSIS — N186 End stage renal disease: Secondary | ICD-10-CM | POA: Diagnosis not present

## 2022-10-17 DIAGNOSIS — D509 Iron deficiency anemia, unspecified: Secondary | ICD-10-CM | POA: Diagnosis not present

## 2022-10-17 DIAGNOSIS — R52 Pain, unspecified: Secondary | ICD-10-CM | POA: Diagnosis not present

## 2022-10-17 DIAGNOSIS — N2581 Secondary hyperparathyroidism of renal origin: Secondary | ICD-10-CM | POA: Diagnosis not present

## 2022-10-17 DIAGNOSIS — D631 Anemia in chronic kidney disease: Secondary | ICD-10-CM | POA: Diagnosis not present

## 2022-10-17 DIAGNOSIS — D689 Coagulation defect, unspecified: Secondary | ICD-10-CM | POA: Diagnosis not present

## 2022-10-19 DIAGNOSIS — D631 Anemia in chronic kidney disease: Secondary | ICD-10-CM | POA: Diagnosis not present

## 2022-10-19 DIAGNOSIS — R52 Pain, unspecified: Secondary | ICD-10-CM | POA: Diagnosis not present

## 2022-10-19 DIAGNOSIS — T7840XA Allergy, unspecified, initial encounter: Secondary | ICD-10-CM | POA: Diagnosis not present

## 2022-10-19 DIAGNOSIS — Z992 Dependence on renal dialysis: Secondary | ICD-10-CM | POA: Diagnosis not present

## 2022-10-19 DIAGNOSIS — N2581 Secondary hyperparathyroidism of renal origin: Secondary | ICD-10-CM | POA: Diagnosis not present

## 2022-10-19 DIAGNOSIS — D689 Coagulation defect, unspecified: Secondary | ICD-10-CM | POA: Diagnosis not present

## 2022-10-19 DIAGNOSIS — D509 Iron deficiency anemia, unspecified: Secondary | ICD-10-CM | POA: Diagnosis not present

## 2022-10-19 DIAGNOSIS — N186 End stage renal disease: Secondary | ICD-10-CM | POA: Diagnosis not present

## 2022-10-21 DIAGNOSIS — T7840XA Allergy, unspecified, initial encounter: Secondary | ICD-10-CM | POA: Diagnosis not present

## 2022-10-21 DIAGNOSIS — D689 Coagulation defect, unspecified: Secondary | ICD-10-CM | POA: Diagnosis not present

## 2022-10-21 DIAGNOSIS — R52 Pain, unspecified: Secondary | ICD-10-CM | POA: Diagnosis not present

## 2022-10-21 DIAGNOSIS — D509 Iron deficiency anemia, unspecified: Secondary | ICD-10-CM | POA: Diagnosis not present

## 2022-10-21 DIAGNOSIS — Z992 Dependence on renal dialysis: Secondary | ICD-10-CM | POA: Diagnosis not present

## 2022-10-21 DIAGNOSIS — N186 End stage renal disease: Secondary | ICD-10-CM | POA: Diagnosis not present

## 2022-10-21 DIAGNOSIS — N2581 Secondary hyperparathyroidism of renal origin: Secondary | ICD-10-CM | POA: Diagnosis not present

## 2022-10-21 DIAGNOSIS — D631 Anemia in chronic kidney disease: Secondary | ICD-10-CM | POA: Diagnosis not present

## 2022-10-24 DIAGNOSIS — N186 End stage renal disease: Secondary | ICD-10-CM | POA: Diagnosis not present

## 2022-10-24 DIAGNOSIS — D689 Coagulation defect, unspecified: Secondary | ICD-10-CM | POA: Diagnosis not present

## 2022-10-24 DIAGNOSIS — D509 Iron deficiency anemia, unspecified: Secondary | ICD-10-CM | POA: Diagnosis not present

## 2022-10-24 DIAGNOSIS — T7840XA Allergy, unspecified, initial encounter: Secondary | ICD-10-CM | POA: Diagnosis not present

## 2022-10-24 DIAGNOSIS — D631 Anemia in chronic kidney disease: Secondary | ICD-10-CM | POA: Diagnosis not present

## 2022-10-24 DIAGNOSIS — Z992 Dependence on renal dialysis: Secondary | ICD-10-CM | POA: Diagnosis not present

## 2022-10-24 DIAGNOSIS — R52 Pain, unspecified: Secondary | ICD-10-CM | POA: Diagnosis not present

## 2022-10-24 DIAGNOSIS — N2581 Secondary hyperparathyroidism of renal origin: Secondary | ICD-10-CM | POA: Diagnosis not present

## 2022-10-26 DIAGNOSIS — D631 Anemia in chronic kidney disease: Secondary | ICD-10-CM | POA: Diagnosis not present

## 2022-10-26 DIAGNOSIS — R52 Pain, unspecified: Secondary | ICD-10-CM | POA: Diagnosis not present

## 2022-10-26 DIAGNOSIS — N2581 Secondary hyperparathyroidism of renal origin: Secondary | ICD-10-CM | POA: Diagnosis not present

## 2022-10-26 DIAGNOSIS — N186 End stage renal disease: Secondary | ICD-10-CM | POA: Diagnosis not present

## 2022-10-26 DIAGNOSIS — D509 Iron deficiency anemia, unspecified: Secondary | ICD-10-CM | POA: Diagnosis not present

## 2022-10-26 DIAGNOSIS — T7840XA Allergy, unspecified, initial encounter: Secondary | ICD-10-CM | POA: Diagnosis not present

## 2022-10-26 DIAGNOSIS — D689 Coagulation defect, unspecified: Secondary | ICD-10-CM | POA: Diagnosis not present

## 2022-10-26 DIAGNOSIS — Z992 Dependence on renal dialysis: Secondary | ICD-10-CM | POA: Diagnosis not present

## 2022-10-28 DIAGNOSIS — N186 End stage renal disease: Secondary | ICD-10-CM | POA: Diagnosis not present

## 2022-10-28 DIAGNOSIS — R52 Pain, unspecified: Secondary | ICD-10-CM | POA: Diagnosis not present

## 2022-10-28 DIAGNOSIS — D631 Anemia in chronic kidney disease: Secondary | ICD-10-CM | POA: Diagnosis not present

## 2022-10-28 DIAGNOSIS — D689 Coagulation defect, unspecified: Secondary | ICD-10-CM | POA: Diagnosis not present

## 2022-10-28 DIAGNOSIS — Z992 Dependence on renal dialysis: Secondary | ICD-10-CM | POA: Diagnosis not present

## 2022-10-28 DIAGNOSIS — N2581 Secondary hyperparathyroidism of renal origin: Secondary | ICD-10-CM | POA: Diagnosis not present

## 2022-10-28 DIAGNOSIS — D509 Iron deficiency anemia, unspecified: Secondary | ICD-10-CM | POA: Diagnosis not present

## 2022-10-28 DIAGNOSIS — T7840XA Allergy, unspecified, initial encounter: Secondary | ICD-10-CM | POA: Diagnosis not present

## 2022-10-31 DIAGNOSIS — N2581 Secondary hyperparathyroidism of renal origin: Secondary | ICD-10-CM | POA: Diagnosis not present

## 2022-10-31 DIAGNOSIS — R52 Pain, unspecified: Secondary | ICD-10-CM | POA: Diagnosis not present

## 2022-10-31 DIAGNOSIS — N186 End stage renal disease: Secondary | ICD-10-CM | POA: Diagnosis not present

## 2022-10-31 DIAGNOSIS — D689 Coagulation defect, unspecified: Secondary | ICD-10-CM | POA: Diagnosis not present

## 2022-10-31 DIAGNOSIS — D509 Iron deficiency anemia, unspecified: Secondary | ICD-10-CM | POA: Diagnosis not present

## 2022-10-31 DIAGNOSIS — Z992 Dependence on renal dialysis: Secondary | ICD-10-CM | POA: Diagnosis not present

## 2022-10-31 DIAGNOSIS — T7840XA Allergy, unspecified, initial encounter: Secondary | ICD-10-CM | POA: Diagnosis not present

## 2022-10-31 DIAGNOSIS — D631 Anemia in chronic kidney disease: Secondary | ICD-10-CM | POA: Diagnosis not present

## 2022-11-01 ENCOUNTER — Ambulatory Visit (INDEPENDENT_AMBULATORY_CARE_PROVIDER_SITE_OTHER): Payer: Medicare Other | Admitting: Family Medicine

## 2022-11-01 ENCOUNTER — Encounter: Payer: Self-pay | Admitting: Family Medicine

## 2022-11-01 VITALS — BP 170/92 | HR 68 | Temp 97.9°F | Resp 18 | Wt 288.4 lb

## 2022-11-01 DIAGNOSIS — F419 Anxiety disorder, unspecified: Secondary | ICD-10-CM

## 2022-11-01 DIAGNOSIS — W57XXXA Bitten or stung by nonvenomous insect and other nonvenomous arthropods, initial encounter: Secondary | ICD-10-CM

## 2022-11-01 DIAGNOSIS — N186 End stage renal disease: Secondary | ICD-10-CM | POA: Diagnosis not present

## 2022-11-01 DIAGNOSIS — J301 Allergic rhinitis due to pollen: Secondary | ICD-10-CM | POA: Diagnosis not present

## 2022-11-01 DIAGNOSIS — M25551 Pain in right hip: Secondary | ICD-10-CM

## 2022-11-01 DIAGNOSIS — S90414A Abrasion, right lesser toe(s), initial encounter: Secondary | ICD-10-CM

## 2022-11-01 DIAGNOSIS — J4520 Mild intermittent asthma, uncomplicated: Secondary | ICD-10-CM

## 2022-11-01 DIAGNOSIS — N138 Other obstructive and reflux uropathy: Secondary | ICD-10-CM | POA: Diagnosis not present

## 2022-11-01 DIAGNOSIS — Z992 Dependence on renal dialysis: Secondary | ICD-10-CM | POA: Diagnosis not present

## 2022-11-01 MED ORDER — ALBUTEROL SULFATE HFA 108 (90 BASE) MCG/ACT IN AERS
2.0000 | INHALATION_SPRAY | RESPIRATORY_TRACT | 1 refills | Status: DC | PRN
Start: 2022-11-01 — End: 2023-01-02

## 2022-11-01 MED ORDER — ALBUTEROL SULFATE HFA 108 (90 BASE) MCG/ACT IN AERS
2.0000 | INHALATION_SPRAY | RESPIRATORY_TRACT | 1 refills | Status: DC | PRN
Start: 2022-11-01 — End: 2022-11-01

## 2022-11-01 MED ORDER — ALPRAZOLAM 0.25 MG PO TABS: 0.2500 mg | ORAL_TABLET | Freq: Every day | ORAL | 0 refills | Status: DC | PRN

## 2022-11-01 NOTE — Progress Notes (Signed)
   Subjective:    Patient ID: Devon Cook, male    DOB: [birth year], 62 y.o.   MRN: [id number]  HPI He is here for evaluation of several concerns.  He states that while playing golf he did note some right hip pain and this has continued to give him trouble.  He also has seasonal allergies and well as occasional difficulty with asthma.  He does use Xanax sparingly and would like a refill on this.  He apparently bought a new pair of golf shoes and they caused an abrasion to the dorsal surface of his left foot He also states that he did have a tick bite and it was removed.  He states that the tick was flat and therefore did not get a blood meal.  Review of Systems     Objective:   Physical Exam Alert and in no distress.  Exam of the feet does show healing lesions present over the dorsal surface of several of his toes.  No evidence of infection.       Assessment & Plan:  Pain of right hip - Plan: DG Hip Unilat W OR W/O Pelvis 2-3 Views Left  Seasonal allergic rhinitis due to pollen  Abrasion of toe of right foot, initial encounter  Anxiety - Plan: ALPRAZolam (XANAX) 0.25 MG tablet  Mild intermittent asthma without complication - Plan: albuterol (VENTOLIN HFA) 108 (90 Base) MCG/ACT inhaler, DISCONTINUED: albuterol (VENTOLIN HFA) 108 (90 Base) MCG/ACT inhaler, DISCONTINUED: albuterol (VENTOLIN HFA) 108 (90 Base) MCG/ACT inhaler  Tick bite, unspecified site, initial encounter Use Claritin or Allegra for your allergies and also try Rhinocort nasal spray Take 2 extra strength Tylenol 3 times per day for the hip. I explained that since the tick did not get a blood meal there is really no risk associated with that.  Did recommend more aggressive treatment of his allergies. I explained that the hip pain could easily be arthritic in nature and we will follow-up based on the x-ray report.

## 2022-11-01 NOTE — Patient Instructions (Addendum)
Use Claritin or Allegra for your allergies and also try Rhinocort nasal spray Take 2 extra strength Tylenol 3 times per day for the hip.

## 2022-11-02 DIAGNOSIS — N186 End stage renal disease: Secondary | ICD-10-CM | POA: Diagnosis not present

## 2022-11-02 DIAGNOSIS — E8770 Fluid overload, unspecified: Secondary | ICD-10-CM | POA: Diagnosis not present

## 2022-11-02 DIAGNOSIS — D509 Iron deficiency anemia, unspecified: Secondary | ICD-10-CM | POA: Diagnosis not present

## 2022-11-02 DIAGNOSIS — T7840XA Allergy, unspecified, initial encounter: Secondary | ICD-10-CM | POA: Diagnosis not present

## 2022-11-02 DIAGNOSIS — Z992 Dependence on renal dialysis: Secondary | ICD-10-CM | POA: Diagnosis not present

## 2022-11-02 DIAGNOSIS — R52 Pain, unspecified: Secondary | ICD-10-CM | POA: Diagnosis not present

## 2022-11-02 DIAGNOSIS — N2581 Secondary hyperparathyroidism of renal origin: Secondary | ICD-10-CM | POA: Diagnosis not present

## 2022-11-02 DIAGNOSIS — D689 Coagulation defect, unspecified: Secondary | ICD-10-CM | POA: Diagnosis not present

## 2022-11-02 DIAGNOSIS — D631 Anemia in chronic kidney disease: Secondary | ICD-10-CM | POA: Diagnosis not present

## 2022-11-03 ENCOUNTER — Encounter (HOSPITAL_COMMUNITY): Payer: Self-pay

## 2022-11-03 ENCOUNTER — Ambulatory Visit
Admission: RE | Admit: 2022-11-03 | Discharge: 2022-11-03 | Disposition: A | Discharge status: Home / Self Care | Payer: Medicare Other | Source: Ambulatory Visit | Attending: Family Medicine | Admitting: Family Medicine

## 2022-11-03 DIAGNOSIS — M25561 Pain in right knee: Secondary | ICD-10-CM | POA: Diagnosis not present

## 2022-11-04 DIAGNOSIS — D509 Iron deficiency anemia, unspecified: Secondary | ICD-10-CM | POA: Diagnosis not present

## 2022-11-04 DIAGNOSIS — T7840XA Allergy, unspecified, initial encounter: Secondary | ICD-10-CM | POA: Diagnosis not present

## 2022-11-04 DIAGNOSIS — N186 End stage renal disease: Secondary | ICD-10-CM | POA: Diagnosis not present

## 2022-11-04 DIAGNOSIS — E8770 Fluid overload, unspecified: Secondary | ICD-10-CM | POA: Diagnosis not present

## 2022-11-04 DIAGNOSIS — N2581 Secondary hyperparathyroidism of renal origin: Secondary | ICD-10-CM | POA: Diagnosis not present

## 2022-11-04 DIAGNOSIS — R52 Pain, unspecified: Secondary | ICD-10-CM | POA: Diagnosis not present

## 2022-11-04 DIAGNOSIS — Z992 Dependence on renal dialysis: Secondary | ICD-10-CM | POA: Diagnosis not present

## 2022-11-04 DIAGNOSIS — D689 Coagulation defect, unspecified: Secondary | ICD-10-CM | POA: Diagnosis not present

## 2022-11-04 DIAGNOSIS — D631 Anemia in chronic kidney disease: Secondary | ICD-10-CM | POA: Diagnosis not present

## 2022-11-07 DIAGNOSIS — T7840XA Allergy, unspecified, initial encounter: Secondary | ICD-10-CM | POA: Diagnosis not present

## 2022-11-07 DIAGNOSIS — Z992 Dependence on renal dialysis: Secondary | ICD-10-CM | POA: Diagnosis not present

## 2022-11-07 DIAGNOSIS — D509 Iron deficiency anemia, unspecified: Secondary | ICD-10-CM | POA: Diagnosis not present

## 2022-11-07 DIAGNOSIS — N186 End stage renal disease: Secondary | ICD-10-CM | POA: Diagnosis not present

## 2022-11-07 DIAGNOSIS — D689 Coagulation defect, unspecified: Secondary | ICD-10-CM | POA: Diagnosis not present

## 2022-11-07 DIAGNOSIS — R52 Pain, unspecified: Secondary | ICD-10-CM | POA: Diagnosis not present

## 2022-11-07 DIAGNOSIS — N2581 Secondary hyperparathyroidism of renal origin: Secondary | ICD-10-CM | POA: Diagnosis not present

## 2022-11-07 DIAGNOSIS — E8770 Fluid overload, unspecified: Secondary | ICD-10-CM | POA: Diagnosis not present

## 2022-11-07 DIAGNOSIS — D631 Anemia in chronic kidney disease: Secondary | ICD-10-CM | POA: Diagnosis not present

## 2022-11-09 DIAGNOSIS — T7840XA Allergy, unspecified, initial encounter: Secondary | ICD-10-CM | POA: Diagnosis not present

## 2022-11-09 DIAGNOSIS — N2581 Secondary hyperparathyroidism of renal origin: Secondary | ICD-10-CM | POA: Diagnosis not present

## 2022-11-09 DIAGNOSIS — E8770 Fluid overload, unspecified: Secondary | ICD-10-CM | POA: Diagnosis not present

## 2022-11-09 DIAGNOSIS — D631 Anemia in chronic kidney disease: Secondary | ICD-10-CM | POA: Diagnosis not present

## 2022-11-09 DIAGNOSIS — D689 Coagulation defect, unspecified: Secondary | ICD-10-CM | POA: Diagnosis not present

## 2022-11-09 DIAGNOSIS — R52 Pain, unspecified: Secondary | ICD-10-CM | POA: Diagnosis not present

## 2022-11-09 DIAGNOSIS — N186 End stage renal disease: Secondary | ICD-10-CM | POA: Diagnosis not present

## 2022-11-09 DIAGNOSIS — Z992 Dependence on renal dialysis: Secondary | ICD-10-CM | POA: Diagnosis not present

## 2022-11-09 DIAGNOSIS — D509 Iron deficiency anemia, unspecified: Secondary | ICD-10-CM | POA: Diagnosis not present

## 2022-11-10 ENCOUNTER — Telehealth: Payer: Self-pay | Admitting: Family Medicine

## 2022-11-10 DIAGNOSIS — M25551 Pain in right hip: Secondary | ICD-10-CM

## 2022-11-10 NOTE — Telephone Encounter (Signed)
PT called and is ok with going to the ortho for his hip, he would like to go to [facility]

## 2022-11-11 ENCOUNTER — Telehealth: Payer: Self-pay | Admitting: Family Medicine

## 2022-11-11 DIAGNOSIS — D689 Coagulation defect, unspecified: Secondary | ICD-10-CM | POA: Diagnosis not present

## 2022-11-11 DIAGNOSIS — D509 Iron deficiency anemia, unspecified: Secondary | ICD-10-CM | POA: Diagnosis not present

## 2022-11-11 DIAGNOSIS — N186 End stage renal disease: Secondary | ICD-10-CM | POA: Diagnosis not present

## 2022-11-11 DIAGNOSIS — E8770 Fluid overload, unspecified: Secondary | ICD-10-CM | POA: Diagnosis not present

## 2022-11-11 DIAGNOSIS — D631 Anemia in chronic kidney disease: Secondary | ICD-10-CM | POA: Diagnosis not present

## 2022-11-11 DIAGNOSIS — N2581 Secondary hyperparathyroidism of renal origin: Secondary | ICD-10-CM | POA: Diagnosis not present

## 2022-11-11 DIAGNOSIS — R52 Pain, unspecified: Secondary | ICD-10-CM | POA: Diagnosis not present

## 2022-11-11 DIAGNOSIS — Z992 Dependence on renal dialysis: Secondary | ICD-10-CM | POA: Diagnosis not present

## 2022-11-11 DIAGNOSIS — T7840XA Allergy, unspecified, initial encounter: Secondary | ICD-10-CM | POA: Diagnosis not present

## 2022-11-11 MED ORDER — TRAMADOL HCL 50 MG PO TABS: 50.0000 mg | ORAL_TABLET | Freq: Three times a day (TID) | ORAL | 0 refills | Status: AC | PRN

## 2022-11-11 NOTE — Telephone Encounter (Signed)
Pt called and states he would like something for pain until he can get in with ortho. Pt states send to [facility]. Please send to [facility] on [address]. Pt can be reached at [phone].

## 2022-11-14 DIAGNOSIS — R52 Pain, unspecified: Secondary | ICD-10-CM | POA: Diagnosis not present

## 2022-11-14 DIAGNOSIS — D689 Coagulation defect, unspecified: Secondary | ICD-10-CM | POA: Diagnosis not present

## 2022-11-14 DIAGNOSIS — D509 Iron deficiency anemia, unspecified: Secondary | ICD-10-CM | POA: Diagnosis not present

## 2022-11-14 DIAGNOSIS — Z992 Dependence on renal dialysis: Secondary | ICD-10-CM | POA: Diagnosis not present

## 2022-11-14 DIAGNOSIS — E8770 Fluid overload, unspecified: Secondary | ICD-10-CM | POA: Diagnosis not present

## 2022-11-14 DIAGNOSIS — T7840XA Allergy, unspecified, initial encounter: Secondary | ICD-10-CM | POA: Diagnosis not present

## 2022-11-14 DIAGNOSIS — N2581 Secondary hyperparathyroidism of renal origin: Secondary | ICD-10-CM | POA: Diagnosis not present

## 2022-11-14 DIAGNOSIS — D631 Anemia in chronic kidney disease: Secondary | ICD-10-CM | POA: Diagnosis not present

## 2022-11-14 DIAGNOSIS — N186 End stage renal disease: Secondary | ICD-10-CM | POA: Diagnosis not present

## 2022-11-16 DIAGNOSIS — D689 Coagulation defect, unspecified: Secondary | ICD-10-CM | POA: Diagnosis not present

## 2022-11-16 DIAGNOSIS — R52 Pain, unspecified: Secondary | ICD-10-CM | POA: Diagnosis not present

## 2022-11-16 DIAGNOSIS — E8770 Fluid overload, unspecified: Secondary | ICD-10-CM | POA: Diagnosis not present

## 2022-11-16 DIAGNOSIS — D631 Anemia in chronic kidney disease: Secondary | ICD-10-CM | POA: Diagnosis not present

## 2022-11-16 DIAGNOSIS — N2581 Secondary hyperparathyroidism of renal origin: Secondary | ICD-10-CM | POA: Diagnosis not present

## 2022-11-16 DIAGNOSIS — N186 End stage renal disease: Secondary | ICD-10-CM | POA: Diagnosis not present

## 2022-11-16 DIAGNOSIS — D509 Iron deficiency anemia, unspecified: Secondary | ICD-10-CM | POA: Diagnosis not present

## 2022-11-16 DIAGNOSIS — T7840XA Allergy, unspecified, initial encounter: Secondary | ICD-10-CM | POA: Diagnosis not present

## 2022-11-16 DIAGNOSIS — Z992 Dependence on renal dialysis: Secondary | ICD-10-CM | POA: Diagnosis not present

## 2022-11-18 DIAGNOSIS — D509 Iron deficiency anemia, unspecified: Secondary | ICD-10-CM | POA: Diagnosis not present

## 2022-11-18 DIAGNOSIS — R52 Pain, unspecified: Secondary | ICD-10-CM | POA: Diagnosis not present

## 2022-11-18 DIAGNOSIS — N186 End stage renal disease: Secondary | ICD-10-CM | POA: Diagnosis not present

## 2022-11-18 DIAGNOSIS — E8770 Fluid overload, unspecified: Secondary | ICD-10-CM | POA: Diagnosis not present

## 2022-11-18 DIAGNOSIS — Z992 Dependence on renal dialysis: Secondary | ICD-10-CM | POA: Diagnosis not present

## 2022-11-18 DIAGNOSIS — N2581 Secondary hyperparathyroidism of renal origin: Secondary | ICD-10-CM | POA: Diagnosis not present

## 2022-11-18 DIAGNOSIS — D631 Anemia in chronic kidney disease: Secondary | ICD-10-CM | POA: Diagnosis not present

## 2022-11-18 DIAGNOSIS — D689 Coagulation defect, unspecified: Secondary | ICD-10-CM | POA: Diagnosis not present

## 2022-11-18 DIAGNOSIS — T7840XA Allergy, unspecified, initial encounter: Secondary | ICD-10-CM | POA: Diagnosis not present

## 2022-11-21 DIAGNOSIS — N186 End stage renal disease: Secondary | ICD-10-CM | POA: Diagnosis not present

## 2022-11-21 DIAGNOSIS — R52 Pain, unspecified: Secondary | ICD-10-CM | POA: Diagnosis not present

## 2022-11-21 DIAGNOSIS — D509 Iron deficiency anemia, unspecified: Secondary | ICD-10-CM | POA: Diagnosis not present

## 2022-11-21 DIAGNOSIS — T7840XA Allergy, unspecified, initial encounter: Secondary | ICD-10-CM | POA: Diagnosis not present

## 2022-11-21 DIAGNOSIS — D689 Coagulation defect, unspecified: Secondary | ICD-10-CM | POA: Diagnosis not present

## 2022-11-21 DIAGNOSIS — N2581 Secondary hyperparathyroidism of renal origin: Secondary | ICD-10-CM | POA: Diagnosis not present

## 2022-11-21 DIAGNOSIS — E8770 Fluid overload, unspecified: Secondary | ICD-10-CM | POA: Diagnosis not present

## 2022-11-21 DIAGNOSIS — Z992 Dependence on renal dialysis: Secondary | ICD-10-CM | POA: Diagnosis not present

## 2022-11-21 DIAGNOSIS — D631 Anemia in chronic kidney disease: Secondary | ICD-10-CM | POA: Diagnosis not present

## 2022-11-23 ENCOUNTER — Encounter (HOSPITAL_COMMUNITY): Payer: Self-pay

## 2022-11-23 ENCOUNTER — Other Ambulatory Visit: Payer: Self-pay

## 2022-11-23 ENCOUNTER — Emergency Department (HOSPITAL_COMMUNITY)
Admission: EM | Admit: 2022-11-23 | Discharge: 2022-11-24 | Disposition: A | Discharge status: Home / Self Care | Payer: Medicare Other | Attending: Emergency Medicine | Admitting: Emergency Medicine

## 2022-11-23 DIAGNOSIS — Y712 Prosthetic and other implants, materials and accessory cardiovascular devices associated with adverse incidents: Secondary | ICD-10-CM | POA: Insufficient documentation

## 2022-11-23 DIAGNOSIS — Z992 Dependence on renal dialysis: Secondary | ICD-10-CM | POA: Insufficient documentation

## 2022-11-23 DIAGNOSIS — D631 Anemia in chronic kidney disease: Secondary | ICD-10-CM | POA: Diagnosis not present

## 2022-11-23 DIAGNOSIS — T7840XA Allergy, unspecified, initial encounter: Secondary | ICD-10-CM | POA: Diagnosis not present

## 2022-11-23 DIAGNOSIS — E8770 Fluid overload, unspecified: Secondary | ICD-10-CM | POA: Diagnosis not present

## 2022-11-23 DIAGNOSIS — D689 Coagulation defect, unspecified: Secondary | ICD-10-CM | POA: Diagnosis not present

## 2022-11-23 DIAGNOSIS — R52 Pain, unspecified: Secondary | ICD-10-CM | POA: Diagnosis not present

## 2022-11-23 DIAGNOSIS — N2581 Secondary hyperparathyroidism of renal origin: Secondary | ICD-10-CM | POA: Diagnosis not present

## 2022-11-23 DIAGNOSIS — D509 Iron deficiency anemia, unspecified: Secondary | ICD-10-CM | POA: Diagnosis not present

## 2022-11-23 DIAGNOSIS — T829XXA Unspecified complication of cardiac and vascular prosthetic device, implant and graft, initial encounter: Secondary | ICD-10-CM | POA: Insufficient documentation

## 2022-11-23 DIAGNOSIS — N186 End stage renal disease: Secondary | ICD-10-CM | POA: Diagnosis not present

## 2022-11-23 DIAGNOSIS — T8249XA Other complication of vascular dialysis catheter, initial encounter: Secondary | ICD-10-CM | POA: Diagnosis not present

## 2022-11-23 LAB — BASIC METABOLIC PANEL
Anion gap: 13 (ref 5–15)
BUN: 26 mg/dL — ABNORMAL HIGH (ref 8–23)
CO2: 30 mmol/L (ref 22–32)
Calcium: 9.4 mg/dL (ref 8.9–10.3)
Chloride: 95 mmol/L — ABNORMAL LOW (ref 98–111)
Creatinine, Ser: 7.25 mg/dL — ABNORMAL HIGH (ref 0.61–1.24)
GFR, Estimated: 8 mL/min — ABNORMAL LOW (ref >60)
Glucose, Bld: 102 mg/dL — ABNORMAL HIGH (ref 70–99)
Potassium: 3.6 mmol/L (ref 3.5–5.1)
Sodium: 138 mmol/L (ref 135–145)

## 2022-11-23 LAB — CBC WITH DIFFERENTIAL/PLATELET
Abs Immature Granulocytes: 0.04 10*3/uL (ref 0.00–0.07)
Basophils Absolute: 0 10*3/uL (ref 0.0–0.1)
Basophils Relative: 0 %
Eosinophils Absolute: 0.3 10*3/uL (ref 0.0–0.5)
Eosinophils Relative: 4 %
HCT: 27.7 % — ABNORMAL LOW (ref 39.0–52.0)
Hemoglobin: 9.1 g/dL — ABNORMAL LOW (ref 13.0–17.0)
Immature Granulocytes: 0 %
Lymphocytes Relative: 17 %
Lymphs Abs: 1.5 10*3/uL (ref 0.7–4.0)
MCH: 26.6 pg (ref 26.0–34.0)
MCHC: 32.9 g/dL (ref 30.0–36.0)
MCV: 81 fL (ref 80.0–100.0)
Monocytes Absolute: 1.3 10*3/uL — ABNORMAL HIGH (ref 0.1–1.0)
Monocytes Relative: 14 %
Neutro Abs: 5.9 10*3/uL (ref 1.7–7.7)
Neutrophils Relative %: 65 %
Platelets: 233 10*3/uL (ref 150–400)
RBC: 3.42 MIL/uL — ABNORMAL LOW (ref 4.22–5.81)
RDW: 16.2 % — ABNORMAL HIGH (ref 11.5–15.5)
WBC: 9.1 10*3/uL (ref 4.0–10.5)
nRBC: 0 % (ref 0.0–0.2)

## 2022-11-23 LAB — PROTIME-INR
INR: 1 (ref 0.8–1.2)
Prothrombin Time: 13.5 seconds (ref 11.4–15.2)

## 2022-11-23 MED ORDER — AMLODIPINE BESYLATE 5 MG PO TABS
10.0000 mg | ORAL_TABLET | Freq: Every day | ORAL | Status: DC
  Administered 2022-11-23: 10 mg via ORAL
  Filled 2022-11-23: qty 2

## 2022-11-23 MED ORDER — ALPRAZOLAM 0.25 MG PO TABS: 0.2500 mg | ORAL_TABLET | Freq: Every day | ORAL | Status: DC | PRN

## 2022-11-23 NOTE — ED Triage Notes (Addendum)
Pt came in via POV d/t his catheter access on his chest was leaking blood that he noticed around 1700 when he woke up. States he had dialysis earlier & it worked, A/Ox4, denies pain, no active bleeding while in triage. The access is clamped, there is clots surrounding the gauze wrapped around the access & it is still moist with blood but no active leakage seen while in triage.

## 2022-11-23 NOTE — Progress Notes (Signed)
Brief nephrology note  Patient with bleeding from catheter site for unclear reason. Asked ER to discuss with IR and IR recommended dialysis nurse flush/inspect catheter for possible cracks or other malfunction with the catheter.  Our dialysis nurse will come assess the catheter as soon as able. Agree with IR team that if no cracks are seen in the catheter then patient can DC home but likely needs catheter exchange tomorrow.

## 2022-11-23 NOTE — ED Notes (Signed)
Pt is a&ox4, warm and dry to touch. Pt states that around 1400 today he went to take a nap and when he woke up he noticed blood on his arm, then noticed that he was bleeding from his dialysis port to right chest. Pt was able to get the bleeding controlled. Pt currently is not bleeding, and there is a moderate amount of controlled bleeding to cath site. Looks as thought the end of the tubes have been wrapped with some kind of cloth. Pt states he does feel weak/dizzy. Denies any chest pain/shob. Pt attached to monitor/vitals. Side rails up x 2, call light in reach. Family at the bedside.

## 2022-11-23 NOTE — ED Provider Notes (Signed)
[place] EMERGENCY DEPARTMENT AT [facility] Provider Note   CSN: [id number] Arrival date & time: 11/23/22  1733     History Chief Complaint  Patient presents with   Dialysis access bleeding    HPI Devon Cook is a 62 y.o. male presenting for chief complaint of bleeding from his tunneled dialysis catheter.  States that he was covered in blood when he woke up from his nap after dialysis today.  Bleeding had stopped fortunately.  He denies fevers chills nausea vomiting syncope shortness of breath.  Otherwise ambulatory tolerating p.o. intake on arrival. Bleeding has stopped without any acute intervention.   Patient's recorded medical, surgical, social, medication list and allergies were reviewed in the Snapshot window as part of the initial history.   Review of Systems   Review of Systems  Constitutional:  Negative for chills and fever.  HENT:  Negative for ear pain and sore throat.   Eyes:  Negative for pain and visual disturbance.  Respiratory:  Negative for cough and shortness of breath.   Cardiovascular:  Negative for chest pain and palpitations.  Gastrointestinal:  Negative for abdominal pain and vomiting.  Genitourinary:  Negative for dysuria and hematuria.  Musculoskeletal:  Negative for arthralgias and back pain.  Skin:  Negative for color change and rash.  Neurological:  Negative for seizures and syncope.  All other systems reviewed and are negative.   Physical Exam Updated Vital Signs BP (!) 151/77 (BP Location: Right Arm)   Pulse 68   Temp 99.1 F (37.3 C) (Oral)   Resp 18   SpO2 99%  Physical Exam Vitals and nursing note reviewed.  Constitutional:      General: He is not in acute distress.    Appearance: He is well-developed.  HENT:     Head: Normocephalic and atraumatic.  Eyes:     Conjunctiva/sclera: Conjunctivae normal.  Cardiovascular:     Rate and Rhythm: Normal rate and regular rhythm.     Heart sounds: No murmur  heard. Pulmonary:     Effort: Pulmonary effort is normal. No respiratory distress.     Breath sounds: Normal breath sounds.  Abdominal:     Palpations: Abdomen is soft.     Tenderness: There is no abdominal tenderness.  Musculoskeletal:        General: No swelling.     Cervical back: Neck supple.  Skin:    General: Skin is warm and dry.     Capillary Refill: Capillary refill takes less than 2 seconds.  Neurological:     Mental Status: He is alert.  Psychiatric:        Mood and Affect: Mood normal.      ED Course/ Medical Decision Making/ A&P    Procedures Procedures   Medications Ordered in ED Medications  amLODipine (NORVASC) tablet 10 mg (has no administration in time range)  ALPRAZolam (XANAX) tablet 0.25 mg (has no administration in time range)    Medical Decision Making:    Devon Cook is a 62 y.o. male who presented to the ED today with chief complaint of bleeding from his Port-A-Cath. States that he woke up after dialysis today with his chest covered in blood.  No active bleeding at that time but he put gauze over the top of everything.  Consult nephrology immediately on patient's arrival.  Nephrology stated that IR needed to be consulted.  IR was consulted who recommended that I talk to nephrology about having a dialysis nurse come interrogate  the catheter.  Talk to dialysis team who is sending down the nurse to interrogate catheter.  Lab work was done demonstrating no acute anemia no acute metabolic emergency.  Home medications ordered while patient is pending dialysis evaluation. If catheter continues to bleed on their interrogation, patient will require observation for IR intervention, however Dr. Lowella Dandy has already arranged for outpatient IR scheduling tomorrow if patient is able to be discharged tonight. Pending dialysis team evaluation at time of handoff to oncoming team.  Clinical Impression:  1. Complication of vascular dialysis catheter, unspecified  complication, initial encounter      Data Unavailable   Final Clinical Impression(s) / ED Diagnoses Final diagnoses:  Complication of vascular dialysis catheter, unspecified complication, initial encounter    Rx / DC Orders ED Discharge Orders     None         Glyn Ade, MD 11/23/22 2321

## 2022-11-24 NOTE — ED Notes (Signed)
Hemodialysis notes:  Discovered red port secured with tape capped and clamped, but covered in dry blood. After cleaning with chloraprep, Blood aspirated from both ports and flush with normal saline. Dressing changed. No complications seen at entrance site. New caps and new dressing - transparent applied. Message sent to Dr. Valentino Nose.

## 2022-11-24 NOTE — Progress Notes (Signed)
Called pt to check on HD catheter. Per pt catheter is not bleeding at this time. Spoke with Dr. Juliette Alcide, since catheter working well for dialysis nurse last night and catheter is not bleeding today, pt to go to dialysis tomorrow and if they have any trouble with dialysis will bring in for exchange. Instructed pt to call back is any bleeding today and we will have him come in today for exchange. Pt agrees with plan.

## 2022-11-24 NOTE — ED Notes (Signed)
Dialysis nurses states port is working fine. Pt in no acute distress, call bell in reach. Vitals stable.

## 2022-11-24 NOTE — ED Provider Notes (Signed)
12:29 AM Assumed care from Dr. Doran Durand, please see their note for full history, physical and decision making until this point. In brief this is a 62 y.o. year old male who presented to the ED tonight with Dialysis access bleeding     Had some bleeding from tunneled vascath earlier. Clamped it. Waiting for dialysis nurse to interrogate the catheter and if ok, d/c. If not, wait for IR to change out tomorrow.   Nephrology flushed and reportedly couldn't find a leak or other malfunction. D/w Dr. Lowella Dandy, will call later in AM and schedule catheter exchange. Outpatient order placed.   Discharge instructions, including strict return precautions for new or worsening symptoms, given. Patient and/or family verbalized understanding and agreement with the plan as described.   Labs, studies and imaging reviewed by myself and considered in medical decision making if ordered. Imaging interpreted by radiology.  Labs Reviewed  CBC WITH DIFFERENTIAL/PLATELET - Abnormal; Notable for the following components:      Result Value   RBC 3.42 (*)    Hemoglobin 9.1 (*)    HCT 27.7 (*)    RDW 16.2 (*)    Monocytes Absolute 1.3 (*)    All other components within normal limits  BASIC METABOLIC PANEL - Abnormal; Notable for the following components:   Chloride 95 (*)    Glucose, Bld 102 (*)    BUN 26 (*)    Creatinine, Ser 7.25 (*)    GFR, Estimated 8 (*)    All other components within normal limits  PROTIME-INR    No orders to display    No follow-ups on file.    Madhuri Vacca, Barbara Cower, MD 11/24/22 [phone]

## 2022-11-25 ENCOUNTER — Emergency Department (HOSPITAL_COMMUNITY): Payer: Medicare Other

## 2022-11-25 ENCOUNTER — Other Ambulatory Visit: Payer: Self-pay

## 2022-11-25 ENCOUNTER — Emergency Department (HOSPITAL_COMMUNITY)
Admission: EM | Admit: 2022-11-25 | Discharge: 2022-11-25 | Disposition: A | Discharge status: Home / Self Care | Payer: Medicare Other | Attending: Emergency Medicine | Admitting: Emergency Medicine

## 2022-11-25 ENCOUNTER — Encounter (HOSPITAL_COMMUNITY): Payer: Self-pay

## 2022-11-25 DIAGNOSIS — T8249XA Other complication of vascular dialysis catheter, initial encounter: Secondary | ICD-10-CM | POA: Diagnosis not present

## 2022-11-25 DIAGNOSIS — E8770 Fluid overload, unspecified: Secondary | ICD-10-CM | POA: Diagnosis not present

## 2022-11-25 DIAGNOSIS — T8249XD Other complication of vascular dialysis catheter, subsequent encounter: Secondary | ICD-10-CM | POA: Insufficient documentation

## 2022-11-25 DIAGNOSIS — I12 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 stage renal disease: Secondary | ICD-10-CM | POA: Diagnosis not present

## 2022-11-25 DIAGNOSIS — Z7982 Long term (current) use of aspirin: Secondary | ICD-10-CM | POA: Diagnosis not present

## 2022-11-25 DIAGNOSIS — R52 Pain, unspecified: Secondary | ICD-10-CM | POA: Diagnosis not present

## 2022-11-25 DIAGNOSIS — Z4901 Encounter for fitting and adjustment of extracorporeal dialysis catheter: Secondary | ICD-10-CM | POA: Diagnosis not present

## 2022-11-25 DIAGNOSIS — D631 Anemia in chronic kidney disease: Secondary | ICD-10-CM | POA: Diagnosis not present

## 2022-11-25 DIAGNOSIS — N186 End stage renal disease: Secondary | ICD-10-CM | POA: Insufficient documentation

## 2022-11-25 DIAGNOSIS — T8242XA Displacement of vascular dialysis catheter, initial encounter: Secondary | ICD-10-CM | POA: Diagnosis not present

## 2022-11-25 DIAGNOSIS — Z992 Dependence on renal dialysis: Secondary | ICD-10-CM | POA: Insufficient documentation

## 2022-11-25 DIAGNOSIS — D509 Iron deficiency anemia, unspecified: Secondary | ICD-10-CM | POA: Diagnosis not present

## 2022-11-25 DIAGNOSIS — N2581 Secondary hyperparathyroidism of renal origin: Secondary | ICD-10-CM | POA: Diagnosis not present

## 2022-11-25 DIAGNOSIS — Z452 Encounter for adjustment and management of vascular access device: Secondary | ICD-10-CM | POA: Diagnosis not present

## 2022-11-25 DIAGNOSIS — T7840XA Allergy, unspecified, initial encounter: Secondary | ICD-10-CM | POA: Diagnosis not present

## 2022-11-25 DIAGNOSIS — T829XXD Unspecified complication of cardiac and vascular prosthetic device, implant and graft, subsequent encounter: Secondary | ICD-10-CM

## 2022-11-25 DIAGNOSIS — D689 Coagulation defect, unspecified: Secondary | ICD-10-CM | POA: Diagnosis not present

## 2022-11-25 HISTORY — PX: IR FLUORO GUIDE CV LINE RIGHT: IMG2283

## 2022-11-25 MED ORDER — MIDAZOLAM HCL 2 MG/2ML IJ SOLN
INTRAMUSCULAR | Status: AC
  Filled 2022-11-25: qty 2

## 2022-11-25 MED ORDER — LIDOCAINE HCL 1 % IJ SOLN
INTRAMUSCULAR | Status: AC
  Filled 2022-11-25: qty 20

## 2022-11-25 MED ORDER — MIDAZOLAM HCL 2 MG/2ML IJ SOLN
1.0000 mg | Freq: Once | INTRAMUSCULAR | Status: AC
  Administered 2022-11-25: 1 mg via INTRAVENOUS

## 2022-11-25 MED ORDER — CEFAZOLIN SODIUM-DEXTROSE 2-4 GM/100ML-% IV SOLN
INTRAVENOUS | Status: AC
  Filled 2022-11-25: qty 100

## 2022-11-25 MED ORDER — HEPARIN SODIUM (PORCINE) 1000 UNIT/ML IJ SOLN
INTRAMUSCULAR | Status: AC
  Filled 2022-11-25: qty 10

## 2022-11-25 MED ORDER — CHLORHEXIDINE GLUCONATE 4 % EX SOLN
CUTANEOUS | Status: AC
  Filled 2022-11-25: qty 15

## 2022-11-25 MED ORDER — CEFAZOLIN SODIUM-DEXTROSE 2-4 GM/100ML-% IV SOLN
2.0000 g | INTRAVENOUS | Status: AC
  Administered 2022-11-25: 2 g via INTRAVENOUS

## 2022-11-25 NOTE — ED Notes (Signed)
Patient to IR at this time.   

## 2022-11-25 NOTE — Sedation Documentation (Addendum)
Dr. Fredia Sorrow notified that 1mg  of versed was given. Order received from PA prior to administration. IR techs aware and will be completing the time out

## 2022-11-25 NOTE — Consult Note (Signed)
   Patient Status: [facility] - ED  Assessment and Plan: Patient in need of venous access - Plan for exchange of tunneled dialysis catheter  Risks and benefits discussed with the patient including, but not limited to bleeding, infection, vascular injury, pneumothorax which may require chest tube placement, air embolism or even death  All of the patient's questions were answered, patient is agreeable to proceed. Procedure will be done with local anesthesia and versed for anxiety.    Consent signed and in chart.  ______________________________________________________________________   History of Present Illness: Devon Cook is a 62 y.o. male with a medical history significant for prostate cancer, OSA, HTN, Hepatitis C, bilateral hydronephrosis with prior PCNs placed by IR team, and ESRD on hemodialysis via tunneled dialysis catheter placed by Vascular two years ago. The patient presented to the ED 11/23/22 with complaints of bleeding from the dialysis catheter. A dialysis nurse assessed the catheter, flushed the catheter, etc and found no defects. Patient discharged from the ED but he returned to the ED today with bleeding from the catheter. Interventional Radiology asked to exchange catheter.   Allergies and medications reviewed.   Review of Systems: A 12 point ROS discussed and pertinent positives are indicated in the HPI above.  All other systems are negative.  Review of Systems  Constitutional:  Negative for appetite change and fatigue.  Respiratory:  Negative for cough and shortness of breath.   Cardiovascular:  Negative for chest pain, palpitations and leg swelling.  Gastrointestinal:  Negative for abdominal pain, diarrhea, nausea and vomiting.  Neurological:  Negative for dizziness and headaches.    Vital Signs: BP (!) 174/138   Pulse 68   Temp 98.3 F (36.8 C) (Oral)   Resp 16   Ht 6\' 2"  (1.88 m)   Wt 286 lb 9.6 oz (130 kg)   SpO2 99%   BMI 36.80 kg/m   Physical  Exam Constitutional:      General: He is not in acute distress.    Appearance: He is not ill-appearing.  HENT:     Mouth/Throat:     Mouth: Mucous membranes are moist.     Pharynx: Oropharynx is clear.  Cardiovascular:     Rate and Rhythm: Normal rate and regular rhythm.  Pulmonary:     Effort: Pulmonary effort is normal.  Skin:    General: Skin is warm and dry.  Neurological:     Mental Status: He is alert and oriented to person, place, and time.  Psychiatric:        Mood and Affect: Mood normal.        Behavior: Behavior normal.        Thought Content: Thought content normal.        Judgment: Judgment normal.      Imaging reviewed.   Labs:  COAGS: Recent Labs    11/23/22 2049  INR 1.0    BMP: Recent Labs    06/17/22 0944 07/13/22 1953 11/23/22 2049  NA 144 140 138  K 4.3 5.6* 3.6  CL 102 105 95*  CO2  --  22 30  GLUCOSE 96 99 102*  BUN 28* 59* 26*  CALCIUM  --  8.9 9.4  CREATININE 7.70* 11.37* 7.25*  GFRNONAA  --  5* 8*    Electronically Signed: Mickie Kay, NP 11/25/2022, 8:27 AM   I spent a total of 15 minutes in face to face in clinical consultation, greater than 50% of which was counseling/coordinating care for venous access.

## 2022-11-25 NOTE — Discharge Instructions (Signed)
You declined lab work today.  Cannot assess your potassium level which can be life-threatening if it is elevated.  Follow-up for dialysis tomorrow as scheduled.  Return to the ED with new or worsening symptoms.

## 2022-11-25 NOTE — Procedures (Signed)
Interventional Radiology Procedure Note  Procedure: Exchange of tunneled HD catheter under fluoro  Complications: None  Estimated Blood Loss: < 10 mL  Findings: New 23 cm Palindrome HD catheter exchanged over wire. Tip in RA. OK to use.  Jodi Marble. Fredia Sorrow, M.D Pager:  [phone]

## 2022-11-25 NOTE — ED Notes (Signed)
Patient refused blood draw at this time.

## 2022-11-25 NOTE — ED Provider Notes (Signed)
[place] EMERGENCY DEPARTMENT AT [facility] Provider Note   CSN: [id number] Arrival date & time: 11/25/22  7829     History  Chief Complaint  Patient presents with   Vascular Access Problem    Patient presents for c/o bleeding at dialysis port. Patient does not want to answer triage questions. Instructed this RN to "look at notes from the dialysis center".     Devon Cook is a 62 y.o. male.  Patient with ESRD on dialysis x 2 years presenting with bleeding from his right tunneled dialysis catheter.  Seen 2 days ago for similar.  Believes blood is coming from the catheter itself and not from the skin.  He was evaluated by the nephrology nurse on the evening of May 22 until the catheter was functioning properly.  Dr. Lowella Dandy of interventional radiology call patient tomorrow to try to arrange exchange later today.  Upon attempting to go to dialysis this morning there was blood noted to be leaking from the catheter again patient was sent to the ED after 1 hour.  He denies any dizziness or lightheadedness.  No chest pain or shortness of breath.  No blood thinner use.  He is requesting exchange of his catheter.  He believes it is cracked somewhere along the access point.  He does make some urine.  Blood pressure today is elevated he has not taken his medications this morning.  The history is provided by the patient.       Home Medications Prior to Admission medications   Medication Sig Start Date End Date Taking? Authorizing Provider  acetaminophen (TYLENOL) 500 MG tablet Take 1,500 mg by mouth every 6 (six) hours as needed for moderate pain.    [provider]  albuterol (VENTOLIN HFA) 108 (90 Base) MCG/ACT inhaler Inhale 2 puffs into the lungs every 4 (four) hours as needed for wheezing or shortness of breath. 11/01/22   Ronnald Nian, MD  allopurinol (ZYLOPRIM) 100 MG tablet TAKE 1 TABLET BY MOUTH ONCE DAILY Patient taking differently: Take 100 mg by mouth daily.  01/22/18   Ronnald Nian, MD  ALPRAZolam Prudy Feeler) 0.25 MG tablet Take 1 tablet (0.25 mg total) by mouth daily as needed. for anxiety 11/01/22   Ronnald Nian, MD  amLODipine (NORVASC) 10 MG tablet Take 1 tablet (10 mg total) by mouth at bedtime. 06/02/22   Ronnald Nian, MD  ascorbic acid (VITAMIN C) 500 MG tablet Take 1,000 mg by mouth daily.    [provider]  aspirin EC 81 MG tablet Take 81 mg by mouth daily.    [provider]  cinacalcet (SENSIPAR) 90 MG tablet Take 90 mg by mouth every evening. 06/10/20   [provider]  diphenhydrAMINE (BENADRYL) 25 MG tablet Take 25 mg by mouth every 6 (six) hours as needed for allergies.    [provider]  ferric citrate (AURYXIA) 1 GM 210 MG(Fe) tablet Take 630 mg by mouth 3 (three) times daily with meals.    [provider]  Ferrous Sulfate (IRON) 325 (65 Fe) MG TABS Take 2 tablets by mouth daily.    [provider]  lidocaine-prilocaine (EMLA) cream     [provider]  losartan (COZAAR) 50 MG tablet Take 50 mg by mouth daily. 03/22/21   [provider]  metoprolol succinate (TOPROL XL) 25 MG 24 hr tablet Take 1 tablet (25 mg total) by mouth daily. Please make overdue appt with Dr. Shari Prows before anymore refills.  Thank you 1st attempt 04/09/21   Meriam Sprague, MD  Multiple Vitamins-Minerals (MULTIVITAMIN WITH MINERALS) tablet Take 1 tablet by mouth daily.    [provider]  multivitamin (RENA-VIT) TABS tablet Take 1 tablet by mouth daily. 04/23/20   [provider]  pantoprazole (PROTONIX) 40 MG tablet Take 1 tablet (40 mg total) by mouth 2 (two) times daily. Patient taking differently: Take 40 mg by mouth 2 (two) times daily. 07/17/19   Tressia Danas, MD  vitamin B-12 (CYANOCOBALAMIN) 1000 MCG tablet Take 1,000 mcg by mouth daily.    [provider]  XTANDI 40 MG capsule Take 40 mg by mouth at bedtime. 04/14/20   [provider]   zinc gluconate 50 MG tablet Take 50 mg by mouth daily.    [provider]      Allergies    Patient has no known allergies.    Review of Systems   Review of Systems  Constitutional:  Negative for activity change, appetite change and fever.  HENT:  Negative for congestion and rhinorrhea.   Respiratory:  Negative for cough, chest tightness and shortness of breath.   Cardiovascular:  Negative for chest pain.  Gastrointestinal:  Negative for nausea and vomiting.  Genitourinary:  Negative for dysuria and hematuria.  Musculoskeletal:  Negative for arthralgias and myalgias.  Skin:  Negative for wound.  Neurological:  Negative for weakness and headaches.   all other systems are negative except as noted in the HPI and PMH.    Physical Exam Updated Vital Signs BP (!) 174/138   Pulse 68   Temp 98.3 F (36.8 C) (Oral)   Resp 16   Ht 6\' 2"  (1.88 m)   Wt 130 kg   SpO2 99%   BMI 36.80 kg/m  Physical Exam Vitals and nursing note reviewed.  Constitutional:      General: He is not in acute distress.    Appearance: He is well-developed.  HENT:     Head: Normocephalic and atraumatic.     Mouth/Throat:     Pharynx: No oropharyngeal exudate.  Eyes:     Conjunctiva/sclera: Conjunctivae normal.     Pupils: Pupils are equal, round, and reactive to light.  Neck:     Comments: No meningismus. Cardiovascular:     Rate and Rhythm: Normal rate and regular rhythm.     Heart sounds: Normal heart sounds. No murmur heard.    Comments: Right IJ tunneled catheter in place.  No bleeding from skin.  No active bleeding from catheter.  Patient has picture of on his phone of blood in the catheter and leaking from the coupling. Pulmonary:     Effort: Pulmonary effort is normal. No respiratory distress.     Breath sounds: Normal breath sounds.  Abdominal:     Palpations: Abdomen is soft.     Tenderness: There is no abdominal tenderness. There is no guarding or rebound.  Musculoskeletal:         General: No tenderness. Normal range of motion.     Cervical back: Normal range of motion and neck supple.  Skin:    General: Skin is warm.  Neurological:     Mental Status: He is alert and oriented to person, place, and time.     Cranial Nerves: No cranial nerve deficit.     Motor: No abnormal muscle tone.     Coordination: Coordination normal.     Comments:  5/5 strength throughout. CN 2-12 intact.Equal grip strength.   Psychiatric:  Behavior: Behavior normal.     ED Results / Procedures / Treatments   Labs (all labs ordered are listed, but only abnormal results are displayed) Labs Reviewed  CBC WITH DIFFERENTIAL/PLATELET  BASIC METABOLIC PANEL  PROTIME-INR  TYPE AND SCREEN    EKG None  Radiology No results found.  Procedures Procedures    Medications Ordered in ED Medications - No data to display  ED Course/ Medical Decision Making/ A&P                             Medical Decision Making Amount and/or Complexity of Data Reviewed Labs: ordered. Decision-making details documented in ED Course. Radiology: ordered and independent interpretation performed. Decision-making details documented in ED Course. ECG/medicine tests: ordered and independent interpretation performed. Decision-making details documented in ED Course.   Return visit for bleeding from dialysis catheter.  Hypertensive on arrival, no distress, no blood thinner use.  No active bleeding at this time.  Will check labs, discuss with interventional radiology for possible exchange  Discussed with Dr. Fredia Sorrow of interventional radiology.  He will plan for catheter exchange later today.  Tunneled dialysis catheter successfully replaced by interventional radiology.  It is ready to use.  Patient anxious to be discharged.  He declines labs today.  Potassium 2 days ago was 3.6.  He states he knows his body well and is never hyperkalemic.  Discussed with patient cannot assess for hyperkalemia which  can be life-threatening without obtaining blood work.  He appears to have capacity to decline labs at this time.  He states he has dialysis again in the morning.  He is hypertensive but did not take his medications today.  Denies chest pain or shortness of breath.  Declines lab work today.  Request to be discharged. Return precautions discussed including chest pain, shortness of breath or other concerns        Final Clinical Impression(s) / ED Diagnoses Final diagnoses:  Complication of vascular access for dialysis, subsequent encounter    Rx / DC Orders ED Discharge Orders     None         Jessy Calixte, Jeannett Senior, MD 11/25/22 1204

## 2022-11-26 DIAGNOSIS — Z992 Dependence on renal dialysis: Secondary | ICD-10-CM | POA: Diagnosis not present

## 2022-11-26 DIAGNOSIS — D689 Coagulation defect, unspecified: Secondary | ICD-10-CM | POA: Diagnosis not present

## 2022-11-26 DIAGNOSIS — D631 Anemia in chronic kidney disease: Secondary | ICD-10-CM | POA: Diagnosis not present

## 2022-11-26 DIAGNOSIS — R52 Pain, unspecified: Secondary | ICD-10-CM | POA: Diagnosis not present

## 2022-11-26 DIAGNOSIS — E8770 Fluid overload, unspecified: Secondary | ICD-10-CM | POA: Diagnosis not present

## 2022-11-26 DIAGNOSIS — N186 End stage renal disease: Secondary | ICD-10-CM | POA: Diagnosis not present

## 2022-11-26 DIAGNOSIS — T7840XA Allergy, unspecified, initial encounter: Secondary | ICD-10-CM | POA: Diagnosis not present

## 2022-11-26 DIAGNOSIS — N2581 Secondary hyperparathyroidism of renal origin: Secondary | ICD-10-CM | POA: Diagnosis not present

## 2022-11-26 DIAGNOSIS — D509 Iron deficiency anemia, unspecified: Secondary | ICD-10-CM | POA: Diagnosis not present

## 2022-11-28 DIAGNOSIS — N2581 Secondary hyperparathyroidism of renal origin: Secondary | ICD-10-CM | POA: Diagnosis not present

## 2022-11-28 DIAGNOSIS — R52 Pain, unspecified: Secondary | ICD-10-CM | POA: Diagnosis not present

## 2022-11-28 DIAGNOSIS — T7840XA Allergy, unspecified, initial encounter: Secondary | ICD-10-CM | POA: Diagnosis not present

## 2022-11-28 DIAGNOSIS — N186 End stage renal disease: Secondary | ICD-10-CM | POA: Diagnosis not present

## 2022-11-28 DIAGNOSIS — D689 Coagulation defect, unspecified: Secondary | ICD-10-CM | POA: Diagnosis not present

## 2022-11-28 DIAGNOSIS — E8770 Fluid overload, unspecified: Secondary | ICD-10-CM | POA: Diagnosis not present

## 2022-11-28 DIAGNOSIS — Z992 Dependence on renal dialysis: Secondary | ICD-10-CM | POA: Diagnosis not present

## 2022-11-28 DIAGNOSIS — D631 Anemia in chronic kidney disease: Secondary | ICD-10-CM | POA: Diagnosis not present

## 2022-11-28 DIAGNOSIS — D509 Iron deficiency anemia, unspecified: Secondary | ICD-10-CM | POA: Diagnosis not present

## 2022-11-30 DIAGNOSIS — N2581 Secondary hyperparathyroidism of renal origin: Secondary | ICD-10-CM | POA: Diagnosis not present

## 2022-11-30 DIAGNOSIS — Z992 Dependence on renal dialysis: Secondary | ICD-10-CM | POA: Diagnosis not present

## 2022-11-30 DIAGNOSIS — D509 Iron deficiency anemia, unspecified: Secondary | ICD-10-CM | POA: Diagnosis not present

## 2022-11-30 DIAGNOSIS — T7840XA Allergy, unspecified, initial encounter: Secondary | ICD-10-CM | POA: Diagnosis not present

## 2022-11-30 DIAGNOSIS — R52 Pain, unspecified: Secondary | ICD-10-CM | POA: Diagnosis not present

## 2022-11-30 DIAGNOSIS — N186 End stage renal disease: Secondary | ICD-10-CM | POA: Diagnosis not present

## 2022-11-30 DIAGNOSIS — D631 Anemia in chronic kidney disease: Secondary | ICD-10-CM | POA: Diagnosis not present

## 2022-11-30 DIAGNOSIS — D689 Coagulation defect, unspecified: Secondary | ICD-10-CM | POA: Diagnosis not present

## 2022-11-30 DIAGNOSIS — E8770 Fluid overload, unspecified: Secondary | ICD-10-CM | POA: Diagnosis not present

## 2022-12-02 ENCOUNTER — Ambulatory Visit: Payer: Medicare Other | Admitting: Medical

## 2022-12-02 DIAGNOSIS — R52 Pain, unspecified: Secondary | ICD-10-CM | POA: Diagnosis not present

## 2022-12-02 DIAGNOSIS — D689 Coagulation defect, unspecified: Secondary | ICD-10-CM | POA: Diagnosis not present

## 2022-12-02 DIAGNOSIS — D631 Anemia in chronic kidney disease: Secondary | ICD-10-CM | POA: Diagnosis not present

## 2022-12-02 DIAGNOSIS — D509 Iron deficiency anemia, unspecified: Secondary | ICD-10-CM | POA: Diagnosis not present

## 2022-12-02 DIAGNOSIS — T7840XA Allergy, unspecified, initial encounter: Secondary | ICD-10-CM | POA: Diagnosis not present

## 2022-12-02 DIAGNOSIS — N186 End stage renal disease: Secondary | ICD-10-CM | POA: Diagnosis not present

## 2022-12-02 DIAGNOSIS — N138 Other obstructive and reflux uropathy: Secondary | ICD-10-CM | POA: Diagnosis not present

## 2022-12-02 DIAGNOSIS — Z992 Dependence on renal dialysis: Secondary | ICD-10-CM | POA: Diagnosis not present

## 2022-12-02 DIAGNOSIS — N2581 Secondary hyperparathyroidism of renal origin: Secondary | ICD-10-CM | POA: Diagnosis not present

## 2022-12-02 DIAGNOSIS — E8770 Fluid overload, unspecified: Secondary | ICD-10-CM | POA: Diagnosis not present

## 2022-12-05 DIAGNOSIS — D631 Anemia in chronic kidney disease: Secondary | ICD-10-CM | POA: Diagnosis not present

## 2022-12-05 DIAGNOSIS — T7840XA Allergy, unspecified, initial encounter: Secondary | ICD-10-CM | POA: Diagnosis not present

## 2022-12-05 DIAGNOSIS — N2581 Secondary hyperparathyroidism of renal origin: Secondary | ICD-10-CM | POA: Diagnosis not present

## 2022-12-05 DIAGNOSIS — Z992 Dependence on renal dialysis: Secondary | ICD-10-CM | POA: Diagnosis not present

## 2022-12-05 DIAGNOSIS — N186 End stage renal disease: Secondary | ICD-10-CM | POA: Diagnosis not present

## 2022-12-05 DIAGNOSIS — R197 Diarrhea, unspecified: Secondary | ICD-10-CM | POA: Diagnosis not present

## 2022-12-05 DIAGNOSIS — D689 Coagulation defect, unspecified: Secondary | ICD-10-CM | POA: Diagnosis not present

## 2022-12-05 DIAGNOSIS — R52 Pain, unspecified: Secondary | ICD-10-CM | POA: Diagnosis not present

## 2022-12-07 DIAGNOSIS — T7840XA Allergy, unspecified, initial encounter: Secondary | ICD-10-CM | POA: Diagnosis not present

## 2022-12-07 DIAGNOSIS — Z992 Dependence on renal dialysis: Secondary | ICD-10-CM | POA: Diagnosis not present

## 2022-12-07 DIAGNOSIS — N2581 Secondary hyperparathyroidism of renal origin: Secondary | ICD-10-CM | POA: Diagnosis not present

## 2022-12-07 DIAGNOSIS — R52 Pain, unspecified: Secondary | ICD-10-CM | POA: Diagnosis not present

## 2022-12-07 DIAGNOSIS — D689 Coagulation defect, unspecified: Secondary | ICD-10-CM | POA: Diagnosis not present

## 2022-12-07 DIAGNOSIS — D631 Anemia in chronic kidney disease: Secondary | ICD-10-CM | POA: Diagnosis not present

## 2022-12-07 DIAGNOSIS — R197 Diarrhea, unspecified: Secondary | ICD-10-CM | POA: Diagnosis not present

## 2022-12-07 DIAGNOSIS — N186 End stage renal disease: Secondary | ICD-10-CM | POA: Diagnosis not present

## 2022-12-08 ENCOUNTER — Ambulatory Visit (INDEPENDENT_AMBULATORY_CARE_PROVIDER_SITE_OTHER): Payer: Medicare Other | Admitting: Family Medicine

## 2022-12-08 ENCOUNTER — Encounter: Payer: Self-pay | Admitting: Family Medicine

## 2022-12-08 VITALS — BP 152/68 | HR 85 | Temp 98.6°F | Wt 287.0 lb

## 2022-12-08 DIAGNOSIS — F419 Anxiety disorder, unspecified: Secondary | ICD-10-CM | POA: Diagnosis not present

## 2022-12-08 DIAGNOSIS — M25561 Pain in right knee: Secondary | ICD-10-CM

## 2022-12-08 DIAGNOSIS — N186 End stage renal disease: Secondary | ICD-10-CM

## 2022-12-08 DIAGNOSIS — Z992 Dependence on renal dialysis: Secondary | ICD-10-CM

## 2022-12-08 MED ORDER — ALPRAZOLAM 0.25 MG PO TABS: 0.2500 mg | ORAL_TABLET | Freq: Every day | ORAL | 0 refills | Status: DC | PRN

## 2022-12-08 NOTE — Progress Notes (Signed)
   Subjective:    Patient ID: Devon Cook, male    DOB: Dec 23, 1960, 62 y.o.   MRN: [id number]  HPI He is here for consult concerning 2 issues.  Apparently he recently had his hemodialysis catheter replaced as it did cause some bleeding.  During the process that he was taking care of the bleeding he knocked over several bottles into the commode, one of them was his pain medication.  He is taking it for his back.  He wants me to look at the catheter site to make sure that it is healing properly. Also 2 days ago while walking he felt a popping sensation in his right knee and is now unable to fully extend his knee.  He is scheduled to see his physician at [facility].  He would like to get a refill on his pain medication and I encouraged him to call t Dr. Berneice Heinrich for refill since they are handling this for his back and he will now need for his knee.  He does have a graduation he would like to go to requiring some walking.  He would also like a refill on his Xanax.   Review of Systems     Objective:   Physical Exam Exam of the right upper catheter site shows no swelling warmth or tenderness. Right knee exam does show a small effusion.  McMurray's testing was positive medially.  Medial and lateral collateral ligaments intact.  Negative anterior drawer.       Assessment & Plan:   Acute pain of right knee  Anxiety - Plan: ALPRAZolam (XANAX) 0.25 MG tablet  ESRD on dialysis [facility]) Recommend conservative care for his knee and mention the possibility of needing some intervention if the popping continues or gets worse.  He is scheduled to see orthopedics on June 11 and I recommend that he discuss it with them.  Otherwise he can come back here for further evaluation.  He is also to call to get a refill on his pain meds from Dr. Unknown Foley who apparently was given this to him in the past. I reassured him that I saw no evidence of infection in the catheter site.

## 2022-12-09 DIAGNOSIS — R197 Diarrhea, unspecified: Secondary | ICD-10-CM | POA: Diagnosis not present

## 2022-12-09 DIAGNOSIS — Z992 Dependence on renal dialysis: Secondary | ICD-10-CM | POA: Diagnosis not present

## 2022-12-09 DIAGNOSIS — N186 End stage renal disease: Secondary | ICD-10-CM | POA: Diagnosis not present

## 2022-12-09 DIAGNOSIS — D631 Anemia in chronic kidney disease: Secondary | ICD-10-CM | POA: Diagnosis not present

## 2022-12-09 DIAGNOSIS — R52 Pain, unspecified: Secondary | ICD-10-CM | POA: Diagnosis not present

## 2022-12-09 DIAGNOSIS — D689 Coagulation defect, unspecified: Secondary | ICD-10-CM | POA: Diagnosis not present

## 2022-12-09 DIAGNOSIS — N2581 Secondary hyperparathyroidism of renal origin: Secondary | ICD-10-CM | POA: Diagnosis not present

## 2022-12-09 DIAGNOSIS — T7840XA Allergy, unspecified, initial encounter: Secondary | ICD-10-CM | POA: Diagnosis not present

## 2022-12-12 DIAGNOSIS — N2581 Secondary hyperparathyroidism of renal origin: Secondary | ICD-10-CM | POA: Diagnosis not present

## 2022-12-12 DIAGNOSIS — R197 Diarrhea, unspecified: Secondary | ICD-10-CM | POA: Diagnosis not present

## 2022-12-12 DIAGNOSIS — T7840XA Allergy, unspecified, initial encounter: Secondary | ICD-10-CM | POA: Diagnosis not present

## 2022-12-12 DIAGNOSIS — D689 Coagulation defect, unspecified: Secondary | ICD-10-CM | POA: Diagnosis not present

## 2022-12-12 DIAGNOSIS — N186 End stage renal disease: Secondary | ICD-10-CM | POA: Diagnosis not present

## 2022-12-12 DIAGNOSIS — Z992 Dependence on renal dialysis: Secondary | ICD-10-CM | POA: Diagnosis not present

## 2022-12-12 DIAGNOSIS — D631 Anemia in chronic kidney disease: Secondary | ICD-10-CM | POA: Diagnosis not present

## 2022-12-12 DIAGNOSIS — R52 Pain, unspecified: Secondary | ICD-10-CM | POA: Diagnosis not present

## 2022-12-13 DIAGNOSIS — M25551 Pain in right hip: Secondary | ICD-10-CM | POA: Diagnosis not present

## 2022-12-13 DIAGNOSIS — M25561 Pain in right knee: Secondary | ICD-10-CM | POA: Diagnosis not present

## 2022-12-14 DIAGNOSIS — T7840XA Allergy, unspecified, initial encounter: Secondary | ICD-10-CM | POA: Diagnosis not present

## 2022-12-14 DIAGNOSIS — R52 Pain, unspecified: Secondary | ICD-10-CM | POA: Diagnosis not present

## 2022-12-14 DIAGNOSIS — N186 End stage renal disease: Secondary | ICD-10-CM | POA: Diagnosis not present

## 2022-12-14 DIAGNOSIS — N2581 Secondary hyperparathyroidism of renal origin: Secondary | ICD-10-CM | POA: Diagnosis not present

## 2022-12-14 DIAGNOSIS — D689 Coagulation defect, unspecified: Secondary | ICD-10-CM | POA: Diagnosis not present

## 2022-12-14 DIAGNOSIS — D631 Anemia in chronic kidney disease: Secondary | ICD-10-CM | POA: Diagnosis not present

## 2022-12-14 DIAGNOSIS — Z992 Dependence on renal dialysis: Secondary | ICD-10-CM | POA: Diagnosis not present

## 2022-12-14 DIAGNOSIS — R197 Diarrhea, unspecified: Secondary | ICD-10-CM | POA: Diagnosis not present

## 2022-12-16 DIAGNOSIS — D689 Coagulation defect, unspecified: Secondary | ICD-10-CM | POA: Diagnosis not present

## 2022-12-16 DIAGNOSIS — T7840XA Allergy, unspecified, initial encounter: Secondary | ICD-10-CM | POA: Diagnosis not present

## 2022-12-16 DIAGNOSIS — D631 Anemia in chronic kidney disease: Secondary | ICD-10-CM | POA: Diagnosis not present

## 2022-12-16 DIAGNOSIS — N186 End stage renal disease: Secondary | ICD-10-CM | POA: Diagnosis not present

## 2022-12-16 DIAGNOSIS — Z992 Dependence on renal dialysis: Secondary | ICD-10-CM | POA: Diagnosis not present

## 2022-12-16 DIAGNOSIS — R52 Pain, unspecified: Secondary | ICD-10-CM | POA: Diagnosis not present

## 2022-12-16 DIAGNOSIS — N2581 Secondary hyperparathyroidism of renal origin: Secondary | ICD-10-CM | POA: Diagnosis not present

## 2022-12-16 DIAGNOSIS — R197 Diarrhea, unspecified: Secondary | ICD-10-CM | POA: Diagnosis not present

## 2022-12-19 DIAGNOSIS — R52 Pain, unspecified: Secondary | ICD-10-CM | POA: Diagnosis not present

## 2022-12-19 DIAGNOSIS — D689 Coagulation defect, unspecified: Secondary | ICD-10-CM | POA: Diagnosis not present

## 2022-12-19 DIAGNOSIS — N2581 Secondary hyperparathyroidism of renal origin: Secondary | ICD-10-CM | POA: Diagnosis not present

## 2022-12-19 DIAGNOSIS — Z992 Dependence on renal dialysis: Secondary | ICD-10-CM | POA: Diagnosis not present

## 2022-12-19 DIAGNOSIS — D631 Anemia in chronic kidney disease: Secondary | ICD-10-CM | POA: Diagnosis not present

## 2022-12-19 DIAGNOSIS — T7840XA Allergy, unspecified, initial encounter: Secondary | ICD-10-CM | POA: Diagnosis not present

## 2022-12-19 DIAGNOSIS — N186 End stage renal disease: Secondary | ICD-10-CM | POA: Diagnosis not present

## 2022-12-19 DIAGNOSIS — R197 Diarrhea, unspecified: Secondary | ICD-10-CM | POA: Diagnosis not present

## 2022-12-21 DIAGNOSIS — N2581 Secondary hyperparathyroidism of renal origin: Secondary | ICD-10-CM | POA: Diagnosis not present

## 2022-12-21 DIAGNOSIS — D631 Anemia in chronic kidney disease: Secondary | ICD-10-CM | POA: Diagnosis not present

## 2022-12-21 DIAGNOSIS — T7840XA Allergy, unspecified, initial encounter: Secondary | ICD-10-CM | POA: Diagnosis not present

## 2022-12-21 DIAGNOSIS — R52 Pain, unspecified: Secondary | ICD-10-CM | POA: Diagnosis not present

## 2022-12-21 DIAGNOSIS — D689 Coagulation defect, unspecified: Secondary | ICD-10-CM | POA: Diagnosis not present

## 2022-12-21 DIAGNOSIS — R197 Diarrhea, unspecified: Secondary | ICD-10-CM | POA: Diagnosis not present

## 2022-12-21 DIAGNOSIS — N186 End stage renal disease: Secondary | ICD-10-CM | POA: Diagnosis not present

## 2022-12-21 DIAGNOSIS — Z992 Dependence on renal dialysis: Secondary | ICD-10-CM | POA: Diagnosis not present

## 2022-12-23 DIAGNOSIS — D689 Coagulation defect, unspecified: Secondary | ICD-10-CM | POA: Diagnosis not present

## 2022-12-23 DIAGNOSIS — N2581 Secondary hyperparathyroidism of renal origin: Secondary | ICD-10-CM | POA: Diagnosis not present

## 2022-12-23 DIAGNOSIS — R197 Diarrhea, unspecified: Secondary | ICD-10-CM | POA: Diagnosis not present

## 2022-12-23 DIAGNOSIS — R52 Pain, unspecified: Secondary | ICD-10-CM | POA: Diagnosis not present

## 2022-12-23 DIAGNOSIS — D631 Anemia in chronic kidney disease: Secondary | ICD-10-CM | POA: Diagnosis not present

## 2022-12-23 DIAGNOSIS — N186 End stage renal disease: Secondary | ICD-10-CM | POA: Diagnosis not present

## 2022-12-23 DIAGNOSIS — T7840XA Allergy, unspecified, initial encounter: Secondary | ICD-10-CM | POA: Diagnosis not present

## 2022-12-23 DIAGNOSIS — Z992 Dependence on renal dialysis: Secondary | ICD-10-CM | POA: Diagnosis not present

## 2022-12-26 DIAGNOSIS — N2581 Secondary hyperparathyroidism of renal origin: Secondary | ICD-10-CM | POA: Diagnosis not present

## 2022-12-26 DIAGNOSIS — Z992 Dependence on renal dialysis: Secondary | ICD-10-CM | POA: Diagnosis not present

## 2022-12-26 DIAGNOSIS — D631 Anemia in chronic kidney disease: Secondary | ICD-10-CM | POA: Diagnosis not present

## 2022-12-26 DIAGNOSIS — D689 Coagulation defect, unspecified: Secondary | ICD-10-CM | POA: Diagnosis not present

## 2022-12-26 DIAGNOSIS — R52 Pain, unspecified: Secondary | ICD-10-CM | POA: Diagnosis not present

## 2022-12-26 DIAGNOSIS — R197 Diarrhea, unspecified: Secondary | ICD-10-CM | POA: Diagnosis not present

## 2022-12-26 DIAGNOSIS — N186 End stage renal disease: Secondary | ICD-10-CM | POA: Diagnosis not present

## 2022-12-26 DIAGNOSIS — T7840XA Allergy, unspecified, initial encounter: Secondary | ICD-10-CM | POA: Diagnosis not present

## 2022-12-28 DIAGNOSIS — R52 Pain, unspecified: Secondary | ICD-10-CM | POA: Diagnosis not present

## 2022-12-28 DIAGNOSIS — D689 Coagulation defect, unspecified: Secondary | ICD-10-CM | POA: Diagnosis not present

## 2022-12-28 DIAGNOSIS — D631 Anemia in chronic kidney disease: Secondary | ICD-10-CM | POA: Diagnosis not present

## 2022-12-28 DIAGNOSIS — N2581 Secondary hyperparathyroidism of renal origin: Secondary | ICD-10-CM | POA: Diagnosis not present

## 2022-12-28 DIAGNOSIS — R197 Diarrhea, unspecified: Secondary | ICD-10-CM | POA: Diagnosis not present

## 2022-12-28 DIAGNOSIS — Z992 Dependence on renal dialysis: Secondary | ICD-10-CM | POA: Diagnosis not present

## 2022-12-28 DIAGNOSIS — N186 End stage renal disease: Secondary | ICD-10-CM | POA: Diagnosis not present

## 2022-12-28 DIAGNOSIS — T7840XA Allergy, unspecified, initial encounter: Secondary | ICD-10-CM | POA: Diagnosis not present

## 2022-12-29 DIAGNOSIS — M25551 Pain in right hip: Secondary | ICD-10-CM | POA: Diagnosis not present

## 2022-12-29 DIAGNOSIS — M1611 Unilateral primary osteoarthritis, right hip: Secondary | ICD-10-CM | POA: Diagnosis not present

## 2022-12-30 DIAGNOSIS — N2581 Secondary hyperparathyroidism of renal origin: Secondary | ICD-10-CM | POA: Diagnosis not present

## 2022-12-30 DIAGNOSIS — T7840XA Allergy, unspecified, initial encounter: Secondary | ICD-10-CM | POA: Diagnosis not present

## 2022-12-30 DIAGNOSIS — R52 Pain, unspecified: Secondary | ICD-10-CM | POA: Diagnosis not present

## 2022-12-30 DIAGNOSIS — Z992 Dependence on renal dialysis: Secondary | ICD-10-CM | POA: Diagnosis not present

## 2022-12-30 DIAGNOSIS — D689 Coagulation defect, unspecified: Secondary | ICD-10-CM | POA: Diagnosis not present

## 2022-12-30 DIAGNOSIS — R197 Diarrhea, unspecified: Secondary | ICD-10-CM | POA: Diagnosis not present

## 2022-12-30 DIAGNOSIS — D631 Anemia in chronic kidney disease: Secondary | ICD-10-CM | POA: Diagnosis not present

## 2022-12-30 DIAGNOSIS — N186 End stage renal disease: Secondary | ICD-10-CM | POA: Diagnosis not present

## 2023-01-01 ENCOUNTER — Other Ambulatory Visit: Payer: Self-pay | Admitting: Family Medicine

## 2023-01-01 DIAGNOSIS — N186 End stage renal disease: Secondary | ICD-10-CM | POA: Diagnosis not present

## 2023-01-01 DIAGNOSIS — J4520 Mild intermittent asthma, uncomplicated: Secondary | ICD-10-CM

## 2023-01-01 DIAGNOSIS — Z992 Dependence on renal dialysis: Secondary | ICD-10-CM | POA: Diagnosis not present

## 2023-01-01 DIAGNOSIS — N138 Other obstructive and reflux uropathy: Secondary | ICD-10-CM | POA: Diagnosis not present

## 2023-01-02 DIAGNOSIS — T7840XA Allergy, unspecified, initial encounter: Secondary | ICD-10-CM | POA: Diagnosis not present

## 2023-01-02 DIAGNOSIS — N2581 Secondary hyperparathyroidism of renal origin: Secondary | ICD-10-CM | POA: Diagnosis not present

## 2023-01-02 DIAGNOSIS — D509 Iron deficiency anemia, unspecified: Secondary | ICD-10-CM | POA: Diagnosis not present

## 2023-01-02 DIAGNOSIS — I129 Hypertensive chronic kidney disease with stage 1 through stage 4 chronic kidney disease, or unspecified chronic kidney disease: Secondary | ICD-10-CM | POA: Diagnosis not present

## 2023-01-02 DIAGNOSIS — R52 Pain, unspecified: Secondary | ICD-10-CM | POA: Diagnosis not present

## 2023-01-02 DIAGNOSIS — N186 End stage renal disease: Secondary | ICD-10-CM | POA: Diagnosis not present

## 2023-01-02 DIAGNOSIS — R197 Diarrhea, unspecified: Secondary | ICD-10-CM | POA: Diagnosis not present

## 2023-01-02 DIAGNOSIS — D689 Coagulation defect, unspecified: Secondary | ICD-10-CM | POA: Diagnosis not present

## 2023-01-02 DIAGNOSIS — D631 Anemia in chronic kidney disease: Secondary | ICD-10-CM | POA: Diagnosis not present

## 2023-01-02 DIAGNOSIS — E8770 Fluid overload, unspecified: Secondary | ICD-10-CM | POA: Diagnosis not present

## 2023-01-02 DIAGNOSIS — Z992 Dependence on renal dialysis: Secondary | ICD-10-CM | POA: Diagnosis not present

## 2023-01-03 DIAGNOSIS — M1611 Unilateral primary osteoarthritis, right hip: Secondary | ICD-10-CM | POA: Diagnosis not present

## 2023-01-03 DIAGNOSIS — M25551 Pain in right hip: Secondary | ICD-10-CM | POA: Diagnosis not present

## 2023-01-04 DIAGNOSIS — Z992 Dependence on renal dialysis: Secondary | ICD-10-CM | POA: Diagnosis not present

## 2023-01-04 DIAGNOSIS — N2581 Secondary hyperparathyroidism of renal origin: Secondary | ICD-10-CM | POA: Diagnosis not present

## 2023-01-04 DIAGNOSIS — E8770 Fluid overload, unspecified: Secondary | ICD-10-CM | POA: Diagnosis not present

## 2023-01-04 DIAGNOSIS — T7840XA Allergy, unspecified, initial encounter: Secondary | ICD-10-CM | POA: Diagnosis not present

## 2023-01-04 DIAGNOSIS — D631 Anemia in chronic kidney disease: Secondary | ICD-10-CM | POA: Diagnosis not present

## 2023-01-04 DIAGNOSIS — R197 Diarrhea, unspecified: Secondary | ICD-10-CM | POA: Diagnosis not present

## 2023-01-04 DIAGNOSIS — D689 Coagulation defect, unspecified: Secondary | ICD-10-CM | POA: Diagnosis not present

## 2023-01-04 DIAGNOSIS — D509 Iron deficiency anemia, unspecified: Secondary | ICD-10-CM | POA: Diagnosis not present

## 2023-01-04 DIAGNOSIS — I129 Hypertensive chronic kidney disease with stage 1 through stage 4 chronic kidney disease, or unspecified chronic kidney disease: Secondary | ICD-10-CM | POA: Diagnosis not present

## 2023-01-04 DIAGNOSIS — R52 Pain, unspecified: Secondary | ICD-10-CM | POA: Diagnosis not present

## 2023-01-04 DIAGNOSIS — N186 End stage renal disease: Secondary | ICD-10-CM | POA: Diagnosis not present

## 2023-01-06 DIAGNOSIS — D631 Anemia in chronic kidney disease: Secondary | ICD-10-CM | POA: Diagnosis not present

## 2023-01-06 DIAGNOSIS — Z992 Dependence on renal dialysis: Secondary | ICD-10-CM | POA: Diagnosis not present

## 2023-01-06 DIAGNOSIS — R197 Diarrhea, unspecified: Secondary | ICD-10-CM | POA: Diagnosis not present

## 2023-01-06 DIAGNOSIS — T7840XA Allergy, unspecified, initial encounter: Secondary | ICD-10-CM | POA: Diagnosis not present

## 2023-01-06 DIAGNOSIS — I129 Hypertensive chronic kidney disease with stage 1 through stage 4 chronic kidney disease, or unspecified chronic kidney disease: Secondary | ICD-10-CM | POA: Diagnosis not present

## 2023-01-06 DIAGNOSIS — N186 End stage renal disease: Secondary | ICD-10-CM | POA: Diagnosis not present

## 2023-01-06 DIAGNOSIS — R52 Pain, unspecified: Secondary | ICD-10-CM | POA: Diagnosis not present

## 2023-01-06 DIAGNOSIS — E8770 Fluid overload, unspecified: Secondary | ICD-10-CM | POA: Diagnosis not present

## 2023-01-06 DIAGNOSIS — D689 Coagulation defect, unspecified: Secondary | ICD-10-CM | POA: Diagnosis not present

## 2023-01-06 DIAGNOSIS — N2581 Secondary hyperparathyroidism of renal origin: Secondary | ICD-10-CM | POA: Diagnosis not present

## 2023-01-06 DIAGNOSIS — D509 Iron deficiency anemia, unspecified: Secondary | ICD-10-CM | POA: Diagnosis not present

## 2023-01-09 DIAGNOSIS — R52 Pain, unspecified: Secondary | ICD-10-CM | POA: Diagnosis not present

## 2023-01-09 DIAGNOSIS — I129 Hypertensive chronic kidney disease with stage 1 through stage 4 chronic kidney disease, or unspecified chronic kidney disease: Secondary | ICD-10-CM | POA: Diagnosis not present

## 2023-01-09 DIAGNOSIS — D631 Anemia in chronic kidney disease: Secondary | ICD-10-CM | POA: Diagnosis not present

## 2023-01-09 DIAGNOSIS — D689 Coagulation defect, unspecified: Secondary | ICD-10-CM | POA: Diagnosis not present

## 2023-01-09 DIAGNOSIS — Z992 Dependence on renal dialysis: Secondary | ICD-10-CM | POA: Diagnosis not present

## 2023-01-09 DIAGNOSIS — E8770 Fluid overload, unspecified: Secondary | ICD-10-CM | POA: Diagnosis not present

## 2023-01-09 DIAGNOSIS — N2581 Secondary hyperparathyroidism of renal origin: Secondary | ICD-10-CM | POA: Diagnosis not present

## 2023-01-09 DIAGNOSIS — R197 Diarrhea, unspecified: Secondary | ICD-10-CM | POA: Diagnosis not present

## 2023-01-09 DIAGNOSIS — N186 End stage renal disease: Secondary | ICD-10-CM | POA: Diagnosis not present

## 2023-01-09 DIAGNOSIS — T7840XA Allergy, unspecified, initial encounter: Secondary | ICD-10-CM | POA: Diagnosis not present

## 2023-01-09 DIAGNOSIS — D509 Iron deficiency anemia, unspecified: Secondary | ICD-10-CM | POA: Diagnosis not present

## 2023-01-10 DIAGNOSIS — M1611 Unilateral primary osteoarthritis, right hip: Secondary | ICD-10-CM | POA: Diagnosis not present

## 2023-01-10 DIAGNOSIS — M25551 Pain in right hip: Secondary | ICD-10-CM | POA: Diagnosis not present

## 2023-01-11 DIAGNOSIS — R52 Pain, unspecified: Secondary | ICD-10-CM | POA: Diagnosis not present

## 2023-01-11 DIAGNOSIS — D509 Iron deficiency anemia, unspecified: Secondary | ICD-10-CM | POA: Diagnosis not present

## 2023-01-11 DIAGNOSIS — R197 Diarrhea, unspecified: Secondary | ICD-10-CM | POA: Diagnosis not present

## 2023-01-11 DIAGNOSIS — E8770 Fluid overload, unspecified: Secondary | ICD-10-CM | POA: Diagnosis not present

## 2023-01-11 DIAGNOSIS — N2581 Secondary hyperparathyroidism of renal origin: Secondary | ICD-10-CM | POA: Diagnosis not present

## 2023-01-11 DIAGNOSIS — N186 End stage renal disease: Secondary | ICD-10-CM | POA: Diagnosis not present

## 2023-01-11 DIAGNOSIS — T7840XA Allergy, unspecified, initial encounter: Secondary | ICD-10-CM | POA: Diagnosis not present

## 2023-01-11 DIAGNOSIS — I129 Hypertensive chronic kidney disease with stage 1 through stage 4 chronic kidney disease, or unspecified chronic kidney disease: Secondary | ICD-10-CM | POA: Diagnosis not present

## 2023-01-11 DIAGNOSIS — D689 Coagulation defect, unspecified: Secondary | ICD-10-CM | POA: Diagnosis not present

## 2023-01-11 DIAGNOSIS — Z992 Dependence on renal dialysis: Secondary | ICD-10-CM | POA: Diagnosis not present

## 2023-01-11 DIAGNOSIS — D631 Anemia in chronic kidney disease: Secondary | ICD-10-CM | POA: Diagnosis not present

## 2023-01-12 DIAGNOSIS — N130 Hydronephrosis with ureteropelvic junction obstruction: Secondary | ICD-10-CM | POA: Diagnosis not present

## 2023-01-12 DIAGNOSIS — C7951 Secondary malignant neoplasm of bone: Secondary | ICD-10-CM | POA: Diagnosis not present

## 2023-01-13 ENCOUNTER — Emergency Department (HOSPITAL_COMMUNITY): Payer: Medicare Other

## 2023-01-13 ENCOUNTER — Encounter (HOSPITAL_COMMUNITY): Payer: Self-pay | Admitting: *Deleted

## 2023-01-13 ENCOUNTER — Other Ambulatory Visit: Payer: Self-pay

## 2023-01-13 ENCOUNTER — Emergency Department (HOSPITAL_COMMUNITY)
Admission: EM | Admit: 2023-01-13 | Discharge: 2023-01-14 | Disposition: A | Discharge status: Home / Self Care | Payer: Medicare Other | Attending: Emergency Medicine | Admitting: Emergency Medicine

## 2023-01-13 DIAGNOSIS — R197 Diarrhea, unspecified: Secondary | ICD-10-CM | POA: Diagnosis not present

## 2023-01-13 DIAGNOSIS — Z8546 Personal history of malignant neoplasm of prostate: Secondary | ICD-10-CM | POA: Diagnosis not present

## 2023-01-13 DIAGNOSIS — I129 Hypertensive chronic kidney disease with stage 1 through stage 4 chronic kidney disease, or unspecified chronic kidney disease: Secondary | ICD-10-CM | POA: Diagnosis not present

## 2023-01-13 DIAGNOSIS — Z992 Dependence on renal dialysis: Secondary | ICD-10-CM | POA: Diagnosis not present

## 2023-01-13 DIAGNOSIS — D509 Iron deficiency anemia, unspecified: Secondary | ICD-10-CM | POA: Diagnosis not present

## 2023-01-13 DIAGNOSIS — D689 Coagulation defect, unspecified: Secondary | ICD-10-CM | POA: Diagnosis not present

## 2023-01-13 DIAGNOSIS — I10 Essential (primary) hypertension: Secondary | ICD-10-CM

## 2023-01-13 DIAGNOSIS — I517 Cardiomegaly: Secondary | ICD-10-CM | POA: Diagnosis not present

## 2023-01-13 DIAGNOSIS — Z85830 Personal history of malignant neoplasm of bone: Secondary | ICD-10-CM | POA: Insufficient documentation

## 2023-01-13 DIAGNOSIS — I12 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 stage renal disease: Secondary | ICD-10-CM | POA: Diagnosis not present

## 2023-01-13 DIAGNOSIS — N186 End stage renal disease: Secondary | ICD-10-CM | POA: Diagnosis not present

## 2023-01-13 DIAGNOSIS — R52 Pain, unspecified: Secondary | ICD-10-CM | POA: Diagnosis not present

## 2023-01-13 DIAGNOSIS — R0789 Other chest pain: Secondary | ICD-10-CM | POA: Diagnosis not present

## 2023-01-13 DIAGNOSIS — F17210 Nicotine dependence, cigarettes, uncomplicated: Secondary | ICD-10-CM | POA: Insufficient documentation

## 2023-01-13 DIAGNOSIS — E8770 Fluid overload, unspecified: Secondary | ICD-10-CM | POA: Diagnosis not present

## 2023-01-13 DIAGNOSIS — T7840XA Allergy, unspecified, initial encounter: Secondary | ICD-10-CM | POA: Diagnosis not present

## 2023-01-13 DIAGNOSIS — R079 Chest pain, unspecified: Secondary | ICD-10-CM | POA: Diagnosis not present

## 2023-01-13 DIAGNOSIS — N2581 Secondary hyperparathyroidism of renal origin: Secondary | ICD-10-CM | POA: Diagnosis not present

## 2023-01-13 DIAGNOSIS — R9389 Abnormal findings on diagnostic imaging of other specified body structures: Secondary | ICD-10-CM | POA: Diagnosis not present

## 2023-01-13 DIAGNOSIS — D631 Anemia in chronic kidney disease: Secondary | ICD-10-CM | POA: Diagnosis not present

## 2023-01-13 LAB — BASIC METABOLIC PANEL
Anion gap: 17 — ABNORMAL HIGH (ref 5–15)
BUN: 24 mg/dL — ABNORMAL HIGH (ref 8–23)
CO2: 24 mmol/L (ref 22–32)
Calcium: 9.5 mg/dL (ref 8.9–10.3)
Chloride: 95 mmol/L — ABNORMAL LOW (ref 98–111)
Creatinine, Ser: 7.53 mg/dL — ABNORMAL HIGH (ref 0.61–1.24)
GFR, Estimated: 8 mL/min — ABNORMAL LOW (ref >60)
Glucose, Bld: 97 mg/dL (ref 70–99)
Potassium: 3.8 mmol/L (ref 3.5–5.1)
Sodium: 136 mmol/L (ref 135–145)

## 2023-01-13 LAB — TROPONIN I (HIGH SENSITIVITY): Troponin I (High Sensitivity): 88 ng/L — ABNORMAL HIGH (ref <18)

## 2023-01-13 LAB — CBC
HCT: 34 % — ABNORMAL LOW (ref 39.0–52.0)
Hemoglobin: 11.3 g/dL — ABNORMAL LOW (ref 13.0–17.0)
MCH: 24.9 pg — ABNORMAL LOW (ref 26.0–34.0)
MCHC: 33.2 g/dL (ref 30.0–36.0)
MCV: 74.9 fL — ABNORMAL LOW (ref 80.0–100.0)
Platelets: 249 10*3/uL (ref 150–400)
RBC: 4.54 MIL/uL (ref 4.22–5.81)
RDW: 18.5 % — ABNORMAL HIGH (ref 11.5–15.5)
WBC: 8.4 10*3/uL (ref 4.0–10.5)
nRBC: 0 % (ref 0.0–0.2)

## 2023-01-13 NOTE — ED Triage Notes (Signed)
The pt is a dialysis pt that has been having chest pain with nausea and dizziness for 3 days  he was dialyzed today     dialysis pt he has a fistula in the lt arm  and he has a chest catheter

## 2023-01-14 LAB — TROPONIN I (HIGH SENSITIVITY): Troponin I (High Sensitivity): 81 ng/L — ABNORMAL HIGH (ref <18)

## 2023-01-14 MED ORDER — HYDRALAZINE HCL 10 MG PO TABS: 10.0000 mg | ORAL_TABLET | Freq: Three times a day (TID) | ORAL | 0 refills | Status: DC | PRN

## 2023-01-14 MED ORDER — HYDRALAZINE HCL 10 MG PO TABS
10.0000 mg | ORAL_TABLET | Freq: Once | ORAL | Status: AC
  Administered 2023-01-14: 10 mg via ORAL
  Filled 2023-01-14: qty 1

## 2023-01-14 MED ORDER — CLONIDINE HCL 0.1 MG PO TABS: 0.1000 mg | ORAL_TABLET | Freq: Two times a day (BID) | ORAL | 11 refills | Status: DC | PRN

## 2023-01-14 NOTE — Discharge Instructions (Addendum)
You were evaluated in the Emergency Department and after careful evaluation, we did not find any emergent condition requiring admission or further testing in the hospital.  Your exam/testing today was overall reassuring.  Recommend close follow-up with your regular doctors.  You can take the hydralazine prescription as needed for high blood pressures over the weekend as we discussed.  Please return to the Emergency Department if you experience any worsening of your condition.  Thank you for allowing [place] to be a part of your care.

## 2023-01-14 NOTE — ED Provider Notes (Signed)
[facility] Emergency Department Provider Note MRN:  [id number]  Arrival date & time: 01/14/23     Chief Complaint   Chest Pain   History of Present Illness   Devon Cook is a 62 y.o. year-old male with a history of ESRD on dialysis presenting to the ED with chief complaint of chest pain.  Chest pain today that felt like indigestion in the lower chest and upper abdomen.  Went away.  More concerned about his high blood pressures today.  He felt generally unwell after dialysis yesterday and has noticed high blood pressures throughout the night.  Used to be able to take a clonidine medication  Review of Systems  A thorough review of systems was obtained and all systems are negative except as noted in the HPI and PMH.   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 [facility])    nephrologist-- dr Signe Colt (@ [facility])-- secondary to obstructive uropathy secon  dary to prostate cancer hemodialysis mon/wed/fri @ [facility]---started first week of Nov 2020   GERD (gastroesophageal reflux disease)    Gout 2019   Herpes genitalis in men    History of adenomatous polyp of colon    History of hepatitis C 2017   dx 11/ 207-- treated with Harvoni (in epic 04/ 2019 lab result non-detectable)   History of lower GI bleeding 11/2017   Hypertension    followed by pcp   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 stage renal disease (HCC)    Metastatic cancer to bone  (HCC)    secondary to primary prostate cancer  w/ progressive pain   OSA (obstructive sleep apnea)    severe osa w/ nocturnal hyoxemia per study 06-19-2019,  pt given cpap ( however on 08-30-2019 per pt having issues with mask fitting and on a new one so he not using the cpap);   (03-20-2020 still not using cpap, no mask)   Palpitations    isolated event at ED 10-05-2019 Aflutter/ Afib versus SVT ;   refer to pt's cardiologist note w/ Dr Shari Prows   Prostate 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 [facility]) urologist-- dr Berneice Heinrich   dx 06/ 2019-- advanced w/ mets to bone and pelvic lymphadeopathy   S/P arteriovenous (AV) fistula creation 03/2019   followed  by dr Edilia Bo (vascular)  03-15-2019  by dr Chestine Spore, left branhiocephallic;  last intervention balloon angioplasty 09-04-2020   Secondary hyperparathyroidism of renal origin [facility])    Urgency of urination    Wears glasses     Past Surgical History:  Procedure Laterality Date   A/V FISTULAGRAM Left 09/04/2020   Procedure: A/V FISTULAGRAM;  Surgeon: Chuck Hint, MD;  Location: [facility] INVASIVE CV LAB;  Service: Cardiovascular;  Laterality: Left;   AV FISTULA PLACEMENT Left 03/15/2019   Procedure: ARTERIOVENOUS (AV) FISTULA CREATION LEFT ARM;  Surgeon: Cephus Shelling, MD;  Location: [facility] OR;  Service: Vascular;  Laterality: Left;  COLONOSCOPY     CYSTOSCOPY W/ URETERAL STENT PLACEMENT Bilateral 12/08/2017   Procedure: CYSTOSCOPY WITH RETROGRADE PYELOGRAM/URETERAL STENT PLACEMENT;  Surgeon: Sebastian Ache, MD;  Location: [facility] ORS;  Service: Urology;  Laterality: Bilateral;   CYSTOSCOPY W/ URETERAL STENT PLACEMENT Bilateral 04/20/2018   Procedure: CYSTOSCOPY WITH STENT REPLACEMENT;  Surgeon: Sebastian Ache, MD;  Location: [facility];  Service: Urology;  Laterality: Bilateral;   CYSTOSCOPY W/ URETERAL STENT PLACEMENT Bilateral 08/10/2018   Procedure: CYSTOSCOPY WITH STENT REPLACEMENT, BILATERAL RETROGRADES;  Surgeon: Sebastian Ache, MD;   Location: [facility];  Service: Urology;  Laterality: Bilateral;  45 MINS   CYSTOSCOPY W/ URETERAL STENT PLACEMENT Bilateral 01/18/2019   Procedure: CYSTOSCOPY WITH RETROGRADE PYELOGRAM/URETERAL STENT PLACEMENT;  Surgeon: Sebastian Ache, MD;  Location: [facility];  Service: Urology;  Laterality: Bilateral;   CYSTOSCOPY W/ URETERAL STENT PLACEMENT Bilateral 05/24/2019   Procedure: CYSTOSCOPY WITH RETROGRADE PYELOGRAM/BILATERAL URETERAL STENT EXCHANGE;  Surgeon: Sebastian Ache, MD;  Location: [facility];  Service: Urology;  Laterality: Bilateral;   CYSTOSCOPY W/ URETERAL STENT PLACEMENT Bilateral 09/06/2019   Procedure: CYSTOSCOPY WITH RETROGRADE PYELOGRAM/URETERAL STENT EXCHANGE;  Surgeon: Sebastian Ache, MD;  Location: [facility];  Service: Urology;  Laterality: Bilateral;  45 MINS   CYSTOSCOPY W/ URETERAL STENT PLACEMENT Bilateral 05/08/2020   Procedure: CYSTOSCOPY WITH RETROGRADE PYELOGRAM/URETERAL STENT REPLACEMENT;  Surgeon: Sebastian Ache, MD;  Location: [facility];  Service: Urology;  Laterality: Bilateral;   CYSTOSCOPY W/ URETERAL STENT PLACEMENT Bilateral 06/04/2021   Procedure: CYSTOSCOPY WITH RETROGRADE PYELOGRAM/URETERAL STENT EXCHANGE;  Surgeon: Sebastian Ache, MD;  Location: [facility];  Service: Urology;  Laterality: Bilateral;  45 MINS   CYSTOSCOPY W/ URETERAL STENT PLACEMENT Bilateral 06/17/2022   Procedure: CYSTOSCOPY WITH RETROGRADE PYELOGRAM/URETERAL STENT EXCHANGE;  Surgeon: Sebastian Ache, MD;  Location: [facility];  Service: Urology;  Laterality: Bilateral;   INSERTION OF DIALYSIS CATHETER Right 05/06/2019   Procedure: INSERTION OF TUNNELED  DIALYSIS CATHETER;  Surgeon: Larina Earthly, MD;  [facility];  Service: Vascular;  Laterality: Right;   IR FLUORO GUIDE CV LINE RIGHT  11/25/2022   IR NEPHROSTOMY PLACEMENT LEFT  11/15/2017   IR NEPHROSTOMY PLACEMENT RIGHT   11/15/2017   PERIPHERAL VASCULAR INTERVENTION Left 09/04/2020   Procedure: PERIPHERAL VASCULAR INTERVENTION;  Surgeon: Chuck Hint, MD;  Location: [facility] INVASIVE CV LAB;  Service: Cardiovascular;  Laterality: Left;   PROSTATE BIOPSY N/A 12/08/2017   Procedure: BIOPSY TRANSRECTAL ULTRASONIC PROSTATE (TUBP), PERINEAL BIOPSY;  Surgeon: Sebastian Ache, MD;  [facility];  Service: Urology;  Laterality: N/A;   TRANSURETHRAL RESECTION OF BLADDER TUMOR N/A 12/08/2017   Procedure: TRANSURETHRAL RESECTION OF BLADDER TUMOR (TURBT);  Surgeon: Sebastian Ache, MD;  [facility];  Service: Urology;  Laterality: N/A;   TRICEPS TENDON REPAIR Bilateral 08/2005   UPPER GASTROINTESTINAL ENDOSCOPY      Family History  Problem Relation Age of Onset   Hypertension Mother    Heart disease Mother    Diabetes Sister    Diabetes Brother    Prostate cancer Maternal Grandfather    Diabetes Sister    Prostate cancer Maternal Uncle    Colon polyps Neg Hx    Esophageal cancer Neg Hx    Stomach cancer Neg Hx    Rectal cancer Neg Hx     Social History   Socioeconomic History   Marital status: Married    Spouse name: Not on file   Number of children:  5   Years of education: Not on file   Highest education level: Not on file  Occupational History   Occupation: retired  Tobacco Use   Smoking status: Some Days    Current packs/day: 0.50    Average packs/day: 0.5 packs/day for 6.0 years (3.0 ttl pk-yrs)    Types: Cigarettes, Pipe, Cigars   Smokeless tobacco: Never   Tobacco comments:      smokes some days cigarettes as of 06-16-2022  and occasional pipe / cigars  Vaping Use   Vaping status: Never Used  Substance and Sexual Activity   Alcohol use: Yes    Comment: 2 drinks per weekend   Drug use: No   Sexual activity: Yes  Other Topics Concern   Not on file  Social History Narrative   Mon/wed Friday hemodialysis at [facility] on [address] [place]   Dr Signe Colt  nephrologist   Left arm fistula   Current uses right ij catheter for dialysis   Still makes some urine   Social Determinants of Health   Financial Resource Strain: Medium Risk (03/18/2022)   Overall Financial Resource Strain (CARDIA)    Difficulty of Paying Living Expenses: Somewhat hard  Food Insecurity: Food Insecurity Present (03/18/2022)   Hunger Vital Sign    Worried About Running Out of Food in the Last Year: Often true    Ran Out of Food in the Last Year: Often true  Transportation Needs: No Transportation Needs (03/18/2022)   PRAPARE - Administrator, Civil Service (Medical): No    Lack of Transportation (Non-Medical): No  Physical Activity: Inactive (03/18/2022)   Exercise Vital Sign    Days of Exercise per Week: 0 days    Minutes of Exercise per Session: 0 min  Stress: No Stress Concern Present (03/18/2022)   [employer] of Occupational Health - Occupational Stress Questionnaire    Feeling of Stress : Not at all  Social Connections: Unknown (11/12/2021)   [facility]   Social Network    Social Network: Not on file  Intimate Partner Violence: Unknown (10/04/2021)   Received from [facility]   HITS    Physically Hurt: Not on file    Insult or Talk Down To: Not on file    Threaten Physical Harm: Not on file    Scream or Curse: Not on file     Physical Exam   Vitals:   01/13/23 2214 01/14/23 0248  BP: (!) 175/97 (!) 169/89  Pulse: 61 (!) 56  Resp: 18 17  Temp: 98.6 F (37 C) 98.6 F (37 C)  SpO2: 100% 100%    CONSTITUTIONAL: Well-appearing, NAD NEURO/PSYCH:  Alert and oriented x 3, no focal deficits EYES:  eyes equal and reactive ENT/NECK:  no LAD, no JVD CARDIO: Regular rate, well-perfused, normal S1 and S2 PULM:  CTAB no wheezing or rhonchi GI/GU:  non-distended, non-tender MSK/SPINE:  No gross deformities, no edema SKIN:  no rash, atraumatic   *Additional and/or pertinent findings included in MDM below  Diagnostic and  Interventional Summary    EKG Interpretation Date/Time:  Saturday January 14 2023 04:41:23 EDT Ventricular Rate:  66 PR Interval:  196 QRS Duration:  100 QT Interval:  462 QTC Calculation: 484 R Axis:   -47  Text Interpretation: Normal sinus rhythm Left axis deviation Minimal voltage criteria for LVH, may be normal variant ( Cornell product ) Prolonged QT Abnormal ECG When compared with ECG of 13-Jul-2022 20:37, PREVIOUS ECG IS PRESENT  Confirmed by Kennis Carina [phone]) on 01/14/2023 4:51:51 AM       Labs Reviewed  BASIC METABOLIC PANEL - Abnormal; Notable for the following components:      Result Value   Chloride 95 (*)    BUN 24 (*)    Creatinine, Ser 7.53 (*)    GFR, Estimated 8 (*)    Anion gap 17 (*)    All other components within normal limits  CBC - Abnormal; Notable for the following components:   Hemoglobin 11.3 (*)    HCT 34.0 (*)    MCV 74.9 (*)    MCH 24.9 (*)    RDW 18.5 (*)    All other components within normal limits  TROPONIN I (HIGH SENSITIVITY) - Abnormal; Notable for the following components:   Troponin I (High Sensitivity) 88 (*)    All other components within normal limits  TROPONIN I (HIGH SENSITIVITY) - Abnormal; Notable for the following components:   Troponin I (High Sensitivity) 81 (*)    All other components within normal limits    DG Chest 2 View  Final Result      Medications - No data to display   Procedures  /  Critical Care Procedures  ED Course and Medical Decision Making  Initial Impression and Ddx Chest pain earlier today favored to be GI in etiology based on description and.  Not having any pain at this time.  Much more worried about his blood pressure.  Possibly had some disequilibrium syndrome after his dialysis session today explaining his malaise.  He has no headache or vision change at this time, doubt that his high blood pressures are causing endorgan damage.  Past medical/surgical history that increases complexity of ED  encounter: ESRD  Interpretation of Diagnostics I personally reviewed the EKG and my interpretation is as follows: Sinus rhythm without concerning ischemic features  Labs reveal anticipated elevated BUN/creatinine, anticipated mild troponin elevation downtrending on repeat, similar to baseline prior values.  Patient Reassessment and Ultimate Disposition/Management     Patient would like to go home, would like a as needed antihypertensive for the weekend, he has dialysis on Monday.  Patient management required discussion with the following services or consulting groups:  None  Complexity of Problems Addressed Acute illness or injury that poses threat of life of bodily function  Additional Data Reviewed and Analyzed Further history obtained from: Further history from spouse/family member  Additional Factors Impacting ED Encounter Risk Prescriptions  Elmer Sow. Pilar Plate, MD [facility] Emergency Medicine [facility] [email or web address]  Final Clinical Impressions(s) / ED Diagnoses     ICD-10-CM   1. Chest pain, unspecified type  R07.9     2. Hypertension, unspecified type  I10       ED Discharge Orders          Ordered    cloNIDine (CATAPRES) 0.1 MG tablet  2 times daily PRN,   Status:  Discontinued        01/14/23 0453    hydrALAZINE (APRESOLINE) 10 MG tablet  3 times daily PRN        01/14/23 0459             Discharge Instructions Discussed with and Provided to Patient:   Discharge Instructions      You were evaluated in the Emergency Department and after careful evaluation, we did not find any emergent condition requiring admission or further testing in the hospital.  Your exam/testing today was overall reassuring.  Recommend  close follow-up with your regular doctors.  You can take the hydralazine prescription as needed for high blood pressures over the weekend as we discussed.  Please return to the Emergency Department if you experience any  worsening of your condition.  Thank you for allowing [place] to be a part of your care.       Sabas Sous, MD 01/14/23 [phone]

## 2023-01-16 DIAGNOSIS — T7840XA Allergy, unspecified, initial encounter: Secondary | ICD-10-CM | POA: Diagnosis not present

## 2023-01-16 DIAGNOSIS — R197 Diarrhea, unspecified: Secondary | ICD-10-CM | POA: Diagnosis not present

## 2023-01-16 DIAGNOSIS — N186 End stage renal disease: Secondary | ICD-10-CM | POA: Diagnosis not present

## 2023-01-16 DIAGNOSIS — Z992 Dependence on renal dialysis: Secondary | ICD-10-CM | POA: Diagnosis not present

## 2023-01-16 DIAGNOSIS — D689 Coagulation defect, unspecified: Secondary | ICD-10-CM | POA: Diagnosis not present

## 2023-01-16 DIAGNOSIS — D631 Anemia in chronic kidney disease: Secondary | ICD-10-CM | POA: Diagnosis not present

## 2023-01-16 DIAGNOSIS — R52 Pain, unspecified: Secondary | ICD-10-CM | POA: Diagnosis not present

## 2023-01-16 DIAGNOSIS — D509 Iron deficiency anemia, unspecified: Secondary | ICD-10-CM | POA: Diagnosis not present

## 2023-01-16 DIAGNOSIS — I129 Hypertensive chronic kidney disease with stage 1 through stage 4 chronic kidney disease, or unspecified chronic kidney disease: Secondary | ICD-10-CM | POA: Diagnosis not present

## 2023-01-16 DIAGNOSIS — N2581 Secondary hyperparathyroidism of renal origin: Secondary | ICD-10-CM | POA: Diagnosis not present

## 2023-01-16 DIAGNOSIS — E8770 Fluid overload, unspecified: Secondary | ICD-10-CM | POA: Diagnosis not present

## 2023-01-18 ENCOUNTER — Inpatient Hospital Stay: Payer: Medicare Other

## 2023-01-18 DIAGNOSIS — E8770 Fluid overload, unspecified: Secondary | ICD-10-CM | POA: Diagnosis not present

## 2023-01-18 DIAGNOSIS — N2581 Secondary hyperparathyroidism of renal origin: Secondary | ICD-10-CM | POA: Diagnosis not present

## 2023-01-18 DIAGNOSIS — R197 Diarrhea, unspecified: Secondary | ICD-10-CM | POA: Diagnosis not present

## 2023-01-18 DIAGNOSIS — Z992 Dependence on renal dialysis: Secondary | ICD-10-CM | POA: Diagnosis not present

## 2023-01-18 DIAGNOSIS — N186 End stage renal disease: Secondary | ICD-10-CM | POA: Diagnosis not present

## 2023-01-18 DIAGNOSIS — D689 Coagulation defect, unspecified: Secondary | ICD-10-CM | POA: Diagnosis not present

## 2023-01-18 DIAGNOSIS — R52 Pain, unspecified: Secondary | ICD-10-CM | POA: Diagnosis not present

## 2023-01-18 DIAGNOSIS — I129 Hypertensive chronic kidney disease with stage 1 through stage 4 chronic kidney disease, or unspecified chronic kidney disease: Secondary | ICD-10-CM | POA: Diagnosis not present

## 2023-01-18 DIAGNOSIS — T7840XA Allergy, unspecified, initial encounter: Secondary | ICD-10-CM | POA: Diagnosis not present

## 2023-01-18 DIAGNOSIS — D631 Anemia in chronic kidney disease: Secondary | ICD-10-CM | POA: Diagnosis not present

## 2023-01-18 DIAGNOSIS — D509 Iron deficiency anemia, unspecified: Secondary | ICD-10-CM | POA: Diagnosis not present

## 2023-01-19 ENCOUNTER — Telehealth: Payer: Self-pay

## 2023-01-19 ENCOUNTER — Ambulatory Visit: Payer: Medicare Other | Admitting: Family Medicine

## 2023-01-19 ENCOUNTER — Encounter: Payer: Self-pay | Admitting: Family Medicine

## 2023-01-19 ENCOUNTER — Other Ambulatory Visit: Payer: Self-pay | Admitting: Family Medicine

## 2023-01-19 VITALS — BP 200/100 | HR 65 | Temp 97.9°F | Resp 16 | Wt 280.0 lb

## 2023-01-19 DIAGNOSIS — F419 Anxiety disorder, unspecified: Secondary | ICD-10-CM

## 2023-01-19 DIAGNOSIS — I150 Renovascular hypertension: Secondary | ICD-10-CM | POA: Diagnosis not present

## 2023-01-19 DIAGNOSIS — N185 Chronic kidney disease, stage 5: Secondary | ICD-10-CM | POA: Diagnosis not present

## 2023-01-19 NOTE — Telephone Encounter (Signed)
Transition Care Management Unsuccessful Follow-up Telephone Call  Date of discharge and from where:  [facility] 7/13  Attempts:  1st Attempt  Reason for unsuccessful TCM follow-up call:  No answer/busy   Lenard Forth [facility] Guide, [facility] [phone] [address] [place], [place], [place] [address] Phone: [phone] Email: [email or web address]

## 2023-01-19 NOTE — Progress Notes (Signed)
   Subjective:    Patient ID: Devon Cook, male    DOB: 12-14-1960, 62 y.o.   MRN: [id number]  HPI He is here for consult concerning his blood pressure.  He is now on dialysis getting this Monday Wednesday and Friday.  Apparently last Friday he felt bad and noticed his blood pressure was in the 200 range.  He went to the emergency room and was given clonidine as well as a Apresoline to take.  He did take the clonidine twice per day but did not start taking the Apresoline.  He has has concerns over proper use of the medications.  He is scheduled for dialysis again tomorrow.   Review of Systems     Objective:    Physical Exam Alert and in no distress.  Blood pressure is recorded.       Assessment & Plan:  Chronic renal failure, stage 5 (HCC)  Renovascular hypertension I reviewed his blood pressure medications and the new addition to that of clonidine and a Apresoline.  Reinforced the fact that he needs to take his regular medications plus clonidine twice per day and start taking a Apresoline 3 times per day.  He is to follow-up tomorrow with dialysis and to follow their protocol concerning taking these medications.  He expressed understanding of this.

## 2023-01-20 ENCOUNTER — Telehealth: Payer: Self-pay

## 2023-01-20 DIAGNOSIS — R52 Pain, unspecified: Secondary | ICD-10-CM | POA: Diagnosis not present

## 2023-01-20 DIAGNOSIS — R197 Diarrhea, unspecified: Secondary | ICD-10-CM | POA: Diagnosis not present

## 2023-01-20 DIAGNOSIS — T7840XA Allergy, unspecified, initial encounter: Secondary | ICD-10-CM | POA: Diagnosis not present

## 2023-01-20 DIAGNOSIS — N186 End stage renal disease: Secondary | ICD-10-CM | POA: Diagnosis not present

## 2023-01-20 DIAGNOSIS — N2581 Secondary hyperparathyroidism of renal origin: Secondary | ICD-10-CM | POA: Diagnosis not present

## 2023-01-20 DIAGNOSIS — I129 Hypertensive chronic kidney disease with stage 1 through stage 4 chronic kidney disease, or unspecified chronic kidney disease: Secondary | ICD-10-CM | POA: Diagnosis not present

## 2023-01-20 DIAGNOSIS — D689 Coagulation defect, unspecified: Secondary | ICD-10-CM | POA: Diagnosis not present

## 2023-01-20 DIAGNOSIS — Z992 Dependence on renal dialysis: Secondary | ICD-10-CM | POA: Diagnosis not present

## 2023-01-20 DIAGNOSIS — D509 Iron deficiency anemia, unspecified: Secondary | ICD-10-CM | POA: Diagnosis not present

## 2023-01-20 DIAGNOSIS — D631 Anemia in chronic kidney disease: Secondary | ICD-10-CM | POA: Diagnosis not present

## 2023-01-20 DIAGNOSIS — E8770 Fluid overload, unspecified: Secondary | ICD-10-CM | POA: Diagnosis not present

## 2023-01-20 MED ORDER — ALPRAZOLAM 0.25 MG PO TABS: 0.2500 mg | ORAL_TABLET | Freq: Every day | ORAL | 0 refills | Status: DC | PRN

## 2023-01-20 NOTE — Telephone Encounter (Signed)
Transition Care Management Follow-up Telephone Call Date of discharge and from where: [facility] How have you been since you were released from the hospital? Still working with PCP and Specialist to regulate BP Any questions or concerns? No  Items Reviewed: Did the pt receive and understand the discharge instructions provided? Yes  Medications obtained and verified? Yes  Other? No  Any new allergies since your discharge? No  Dietary orders reviewed? No Do you have support at home? Yes    Follow up appointments reviewed:  PCP Hospital f/u appt confirmed? Yes  Scheduled to see  on  @ . Specialist Hospital f/u appt confirmed? Yes  Scheduled to see  on  @ . Are transportation arrangements needed? No  If their condition worsens, is the pt aware to call PCP or go to the Emergency Dept.? Yes Was the patient provided with contact information for the PCP's office or ED? Yes Was to pt encouraged to call back with questions or concerns? Yes

## 2023-01-23 DIAGNOSIS — E8770 Fluid overload, unspecified: Secondary | ICD-10-CM | POA: Diagnosis not present

## 2023-01-23 DIAGNOSIS — N186 End stage renal disease: Secondary | ICD-10-CM | POA: Diagnosis not present

## 2023-01-23 DIAGNOSIS — D631 Anemia in chronic kidney disease: Secondary | ICD-10-CM | POA: Diagnosis not present

## 2023-01-23 DIAGNOSIS — D509 Iron deficiency anemia, unspecified: Secondary | ICD-10-CM | POA: Diagnosis not present

## 2023-01-23 DIAGNOSIS — Z992 Dependence on renal dialysis: Secondary | ICD-10-CM | POA: Diagnosis not present

## 2023-01-23 DIAGNOSIS — T7840XA Allergy, unspecified, initial encounter: Secondary | ICD-10-CM | POA: Diagnosis not present

## 2023-01-23 DIAGNOSIS — R197 Diarrhea, unspecified: Secondary | ICD-10-CM | POA: Diagnosis not present

## 2023-01-23 DIAGNOSIS — R52 Pain, unspecified: Secondary | ICD-10-CM | POA: Diagnosis not present

## 2023-01-23 DIAGNOSIS — D689 Coagulation defect, unspecified: Secondary | ICD-10-CM | POA: Diagnosis not present

## 2023-01-23 DIAGNOSIS — N2581 Secondary hyperparathyroidism of renal origin: Secondary | ICD-10-CM | POA: Diagnosis not present

## 2023-01-23 DIAGNOSIS — I129 Hypertensive chronic kidney disease with stage 1 through stage 4 chronic kidney disease, or unspecified chronic kidney disease: Secondary | ICD-10-CM | POA: Diagnosis not present

## 2023-01-24 DIAGNOSIS — N186 End stage renal disease: Secondary | ICD-10-CM | POA: Diagnosis not present

## 2023-01-24 DIAGNOSIS — E8770 Fluid overload, unspecified: Secondary | ICD-10-CM | POA: Diagnosis not present

## 2023-01-24 DIAGNOSIS — D689 Coagulation defect, unspecified: Secondary | ICD-10-CM | POA: Diagnosis not present

## 2023-01-24 DIAGNOSIS — I129 Hypertensive chronic kidney disease with stage 1 through stage 4 chronic kidney disease, or unspecified chronic kidney disease: Secondary | ICD-10-CM | POA: Diagnosis not present

## 2023-01-24 DIAGNOSIS — T7840XA Allergy, unspecified, initial encounter: Secondary | ICD-10-CM | POA: Diagnosis not present

## 2023-01-24 DIAGNOSIS — D631 Anemia in chronic kidney disease: Secondary | ICD-10-CM | POA: Diagnosis not present

## 2023-01-24 DIAGNOSIS — N2581 Secondary hyperparathyroidism of renal origin: Secondary | ICD-10-CM | POA: Diagnosis not present

## 2023-01-24 DIAGNOSIS — R52 Pain, unspecified: Secondary | ICD-10-CM | POA: Diagnosis not present

## 2023-01-24 DIAGNOSIS — D509 Iron deficiency anemia, unspecified: Secondary | ICD-10-CM | POA: Diagnosis not present

## 2023-01-24 DIAGNOSIS — Z992 Dependence on renal dialysis: Secondary | ICD-10-CM | POA: Diagnosis not present

## 2023-01-24 DIAGNOSIS — R197 Diarrhea, unspecified: Secondary | ICD-10-CM | POA: Diagnosis not present

## 2023-01-25 DIAGNOSIS — D689 Coagulation defect, unspecified: Secondary | ICD-10-CM | POA: Diagnosis not present

## 2023-01-25 DIAGNOSIS — E8770 Fluid overload, unspecified: Secondary | ICD-10-CM | POA: Diagnosis not present

## 2023-01-25 DIAGNOSIS — I129 Hypertensive chronic kidney disease with stage 1 through stage 4 chronic kidney disease, or unspecified chronic kidney disease: Secondary | ICD-10-CM | POA: Diagnosis not present

## 2023-01-25 DIAGNOSIS — D631 Anemia in chronic kidney disease: Secondary | ICD-10-CM | POA: Diagnosis not present

## 2023-01-25 DIAGNOSIS — N2581 Secondary hyperparathyroidism of renal origin: Secondary | ICD-10-CM | POA: Diagnosis not present

## 2023-01-25 DIAGNOSIS — Z992 Dependence on renal dialysis: Secondary | ICD-10-CM | POA: Diagnosis not present

## 2023-01-25 DIAGNOSIS — R52 Pain, unspecified: Secondary | ICD-10-CM | POA: Diagnosis not present

## 2023-01-25 DIAGNOSIS — T7840XA Allergy, unspecified, initial encounter: Secondary | ICD-10-CM | POA: Diagnosis not present

## 2023-01-25 DIAGNOSIS — N186 End stage renal disease: Secondary | ICD-10-CM | POA: Diagnosis not present

## 2023-01-25 DIAGNOSIS — D509 Iron deficiency anemia, unspecified: Secondary | ICD-10-CM | POA: Diagnosis not present

## 2023-01-25 DIAGNOSIS — R197 Diarrhea, unspecified: Secondary | ICD-10-CM | POA: Diagnosis not present

## 2023-01-27 DIAGNOSIS — N2581 Secondary hyperparathyroidism of renal origin: Secondary | ICD-10-CM | POA: Diagnosis not present

## 2023-01-27 DIAGNOSIS — I129 Hypertensive chronic kidney disease with stage 1 through stage 4 chronic kidney disease, or unspecified chronic kidney disease: Secondary | ICD-10-CM | POA: Diagnosis not present

## 2023-01-27 DIAGNOSIS — R52 Pain, unspecified: Secondary | ICD-10-CM | POA: Diagnosis not present

## 2023-01-27 DIAGNOSIS — R197 Diarrhea, unspecified: Secondary | ICD-10-CM | POA: Diagnosis not present

## 2023-01-27 DIAGNOSIS — D509 Iron deficiency anemia, unspecified: Secondary | ICD-10-CM | POA: Diagnosis not present

## 2023-01-27 DIAGNOSIS — D631 Anemia in chronic kidney disease: Secondary | ICD-10-CM | POA: Diagnosis not present

## 2023-01-27 DIAGNOSIS — D689 Coagulation defect, unspecified: Secondary | ICD-10-CM | POA: Diagnosis not present

## 2023-01-27 DIAGNOSIS — N186 End stage renal disease: Secondary | ICD-10-CM | POA: Diagnosis not present

## 2023-01-27 DIAGNOSIS — E8770 Fluid overload, unspecified: Secondary | ICD-10-CM | POA: Diagnosis not present

## 2023-01-27 DIAGNOSIS — Z992 Dependence on renal dialysis: Secondary | ICD-10-CM | POA: Diagnosis not present

## 2023-01-27 DIAGNOSIS — T7840XA Allergy, unspecified, initial encounter: Secondary | ICD-10-CM | POA: Diagnosis not present

## 2023-01-27 NOTE — Progress Notes (Deleted)
  Cardiology Office Note:  .   Date:  01/27/2023  ID:  Elberta Spaniel, DOB 1961/03/20, MRN [id number] PCP: Ronnald Nian, MD  [facility] Providers Cardiologist:  Meriam Sprague, MD { Click to update primary MD,subspecialty MD or APP then REFRESH:1}   Patient Profile: .      PMH: ESRD on HD M/W/F Renovascular hypertension Anemia Prostate cancer OSA Hyperlipidemia  He was referred to cardiology and seen by Dr. Shari Prows 04/10/2020 for preoperative evaluation prior to bilateral ureteral stent replacement.  ED visit 10/05/2019 with palpitations that developed at HD.  Thought to have SVT versus a flutter.  Per Dr. Shari Prows, strips look like atrial flutter.  CHA2DS2-VASc score of 1 at that time, therefore no anticoagulation was started.  He had good functional capacity and able to perform >4 METS without cardiac issues.  Echocardiogram completed 04/27/2020 which revealed mildly reduced LVEF 45 to 50%, moderately elevated pulmonary pressures, and mildly dilated aorta.  He called our office 12/17/2021 to report palpitations and HR 130 bpm at HD.  He had resumed previously discontinued metoprolol on his own.  He was scheduled for follow-up with Dr. Shari Prows, however there is no record of return cardiology visit.  ED visit 01/14/2023 for complaints of chest pain.  He reported he felt generally unwell after dialysis the day prior and had noticed elevated blood pressures throughout the night.  EKG was unremarkable.Hs trops elevated at 88 >> 81 but consistent with previous findings 6 months prior.        History of Present Illness: Elberta Spaniel is a *** 62 y.o. male who is here today   ROS: ***       Studies Reviewed: .        *** Risk Assessment/Calculations:   {Does this patient have ATRIAL FIBRILLATION?:[id number]} No BP recorded.  {Refresh Note OR Click here to enter BP  :1}***       Physical Exam:   VS:  There were no vitals taken for this visit.   Wt Readings  from Last 3 Encounters:  01/19/23 280 lb (127 kg)  01/13/23 (!) 397 lb 4.3 oz (180.2 kg)  12/08/22 287 lb (130.2 kg)    GEN: Well nourished, well developed in no acute distress NECK: No JVD; No carotid bruits CARDIAC: ***RRR, no murmurs, rubs, gallops RESPIRATORY:  Clear to auscultation without rales, wheezing or rhonchi  ABDOMEN: Soft, non-tender, non-distended EXTREMITIES:  No edema; No deformity     ASSESSMENT AND PLAN: .    Chest pain:  PAF: Hypertension:    {Are you ordering a CV Procedure (e.g. stress test, cath, DCCV, TEE, etc)?   Press F2        :[id number]}  Dispo: ***  Signed, Eligha Bridegroom, NP-C

## 2023-01-30 DIAGNOSIS — T7840XA Allergy, unspecified, initial encounter: Secondary | ICD-10-CM | POA: Diagnosis not present

## 2023-01-30 DIAGNOSIS — E8770 Fluid overload, unspecified: Secondary | ICD-10-CM | POA: Diagnosis not present

## 2023-01-30 DIAGNOSIS — N2581 Secondary hyperparathyroidism of renal origin: Secondary | ICD-10-CM | POA: Diagnosis not present

## 2023-01-30 DIAGNOSIS — D631 Anemia in chronic kidney disease: Secondary | ICD-10-CM | POA: Diagnosis not present

## 2023-01-30 DIAGNOSIS — D689 Coagulation defect, unspecified: Secondary | ICD-10-CM | POA: Diagnosis not present

## 2023-01-30 DIAGNOSIS — D509 Iron deficiency anemia, unspecified: Secondary | ICD-10-CM | POA: Diagnosis not present

## 2023-01-30 DIAGNOSIS — R52 Pain, unspecified: Secondary | ICD-10-CM | POA: Diagnosis not present

## 2023-01-30 DIAGNOSIS — N186 End stage renal disease: Secondary | ICD-10-CM | POA: Diagnosis not present

## 2023-01-30 DIAGNOSIS — I129 Hypertensive chronic kidney disease with stage 1 through stage 4 chronic kidney disease, or unspecified chronic kidney disease: Secondary | ICD-10-CM | POA: Diagnosis not present

## 2023-01-30 DIAGNOSIS — Z992 Dependence on renal dialysis: Secondary | ICD-10-CM | POA: Diagnosis not present

## 2023-01-30 DIAGNOSIS — R197 Diarrhea, unspecified: Secondary | ICD-10-CM | POA: Diagnosis not present

## 2023-02-01 DIAGNOSIS — T7840XA Allergy, unspecified, initial encounter: Secondary | ICD-10-CM | POA: Diagnosis not present

## 2023-02-01 DIAGNOSIS — N2581 Secondary hyperparathyroidism of renal origin: Secondary | ICD-10-CM | POA: Diagnosis not present

## 2023-02-01 DIAGNOSIS — D631 Anemia in chronic kidney disease: Secondary | ICD-10-CM | POA: Diagnosis not present

## 2023-02-01 DIAGNOSIS — D689 Coagulation defect, unspecified: Secondary | ICD-10-CM | POA: Diagnosis not present

## 2023-02-01 DIAGNOSIS — Z992 Dependence on renal dialysis: Secondary | ICD-10-CM | POA: Diagnosis not present

## 2023-02-01 DIAGNOSIS — R197 Diarrhea, unspecified: Secondary | ICD-10-CM | POA: Diagnosis not present

## 2023-02-01 DIAGNOSIS — E8770 Fluid overload, unspecified: Secondary | ICD-10-CM | POA: Diagnosis not present

## 2023-02-01 DIAGNOSIS — D509 Iron deficiency anemia, unspecified: Secondary | ICD-10-CM | POA: Diagnosis not present

## 2023-02-01 DIAGNOSIS — R52 Pain, unspecified: Secondary | ICD-10-CM | POA: Diagnosis not present

## 2023-02-01 DIAGNOSIS — N186 End stage renal disease: Secondary | ICD-10-CM | POA: Diagnosis not present

## 2023-02-01 DIAGNOSIS — N138 Other obstructive and reflux uropathy: Secondary | ICD-10-CM | POA: Diagnosis not present

## 2023-02-01 DIAGNOSIS — I129 Hypertensive chronic kidney disease with stage 1 through stage 4 chronic kidney disease, or unspecified chronic kidney disease: Secondary | ICD-10-CM | POA: Diagnosis not present

## 2023-02-03 DIAGNOSIS — D689 Coagulation defect, unspecified: Secondary | ICD-10-CM | POA: Diagnosis not present

## 2023-02-03 DIAGNOSIS — T7840XA Allergy, unspecified, initial encounter: Secondary | ICD-10-CM | POA: Diagnosis not present

## 2023-02-03 DIAGNOSIS — N186 End stage renal disease: Secondary | ICD-10-CM | POA: Diagnosis not present

## 2023-02-03 DIAGNOSIS — D509 Iron deficiency anemia, unspecified: Secondary | ICD-10-CM | POA: Diagnosis not present

## 2023-02-03 DIAGNOSIS — R52 Pain, unspecified: Secondary | ICD-10-CM | POA: Diagnosis not present

## 2023-02-03 DIAGNOSIS — Z992 Dependence on renal dialysis: Secondary | ICD-10-CM | POA: Diagnosis not present

## 2023-02-03 DIAGNOSIS — N2581 Secondary hyperparathyroidism of renal origin: Secondary | ICD-10-CM | POA: Diagnosis not present

## 2023-02-03 DIAGNOSIS — D631 Anemia in chronic kidney disease: Secondary | ICD-10-CM | POA: Diagnosis not present

## 2023-02-06 ENCOUNTER — Ambulatory Visit: Payer: Medicare Other | Admitting: Nurse Practitioner

## 2023-02-06 DIAGNOSIS — D689 Coagulation defect, unspecified: Secondary | ICD-10-CM | POA: Diagnosis not present

## 2023-02-06 DIAGNOSIS — N2581 Secondary hyperparathyroidism of renal origin: Secondary | ICD-10-CM | POA: Diagnosis not present

## 2023-02-06 DIAGNOSIS — D509 Iron deficiency anemia, unspecified: Secondary | ICD-10-CM | POA: Diagnosis not present

## 2023-02-06 DIAGNOSIS — R52 Pain, unspecified: Secondary | ICD-10-CM | POA: Diagnosis not present

## 2023-02-06 DIAGNOSIS — N186 End stage renal disease: Secondary | ICD-10-CM | POA: Diagnosis not present

## 2023-02-06 DIAGNOSIS — D631 Anemia in chronic kidney disease: Secondary | ICD-10-CM | POA: Diagnosis not present

## 2023-02-06 DIAGNOSIS — T7840XA Allergy, unspecified, initial encounter: Secondary | ICD-10-CM | POA: Diagnosis not present

## 2023-02-06 DIAGNOSIS — Z992 Dependence on renal dialysis: Secondary | ICD-10-CM | POA: Diagnosis not present

## 2023-02-08 ENCOUNTER — Telehealth: Payer: Self-pay

## 2023-02-08 DIAGNOSIS — D631 Anemia in chronic kidney disease: Secondary | ICD-10-CM | POA: Diagnosis not present

## 2023-02-08 DIAGNOSIS — N186 End stage renal disease: Secondary | ICD-10-CM | POA: Diagnosis not present

## 2023-02-08 DIAGNOSIS — D509 Iron deficiency anemia, unspecified: Secondary | ICD-10-CM | POA: Diagnosis not present

## 2023-02-08 DIAGNOSIS — D689 Coagulation defect, unspecified: Secondary | ICD-10-CM | POA: Diagnosis not present

## 2023-02-08 DIAGNOSIS — N2581 Secondary hyperparathyroidism of renal origin: Secondary | ICD-10-CM | POA: Diagnosis not present

## 2023-02-08 DIAGNOSIS — Z992 Dependence on renal dialysis: Secondary | ICD-10-CM | POA: Diagnosis not present

## 2023-02-08 DIAGNOSIS — R52 Pain, unspecified: Secondary | ICD-10-CM | POA: Diagnosis not present

## 2023-02-08 DIAGNOSIS — T7840XA Allergy, unspecified, initial encounter: Secondary | ICD-10-CM | POA: Diagnosis not present

## 2023-02-08 NOTE — Telephone Encounter (Signed)
Pt. Called wanting to know if you could call him at some point today. I asked him what it was in regards to and he said it was about his BP. He said he was in about 2 weeks ago and wants to discuss this with you because it is still elevated.

## 2023-02-10 ENCOUNTER — Telehealth: Payer: Self-pay | Admitting: Family Medicine

## 2023-02-10 DIAGNOSIS — N2581 Secondary hyperparathyroidism of renal origin: Secondary | ICD-10-CM | POA: Diagnosis not present

## 2023-02-10 DIAGNOSIS — T7840XA Allergy, unspecified, initial encounter: Secondary | ICD-10-CM | POA: Diagnosis not present

## 2023-02-10 DIAGNOSIS — Z992 Dependence on renal dialysis: Secondary | ICD-10-CM | POA: Diagnosis not present

## 2023-02-10 DIAGNOSIS — N186 End stage renal disease: Secondary | ICD-10-CM | POA: Diagnosis not present

## 2023-02-10 DIAGNOSIS — D509 Iron deficiency anemia, unspecified: Secondary | ICD-10-CM | POA: Diagnosis not present

## 2023-02-10 DIAGNOSIS — R52 Pain, unspecified: Secondary | ICD-10-CM | POA: Diagnosis not present

## 2023-02-10 DIAGNOSIS — D631 Anemia in chronic kidney disease: Secondary | ICD-10-CM | POA: Diagnosis not present

## 2023-02-10 DIAGNOSIS — D689 Coagulation defect, unspecified: Secondary | ICD-10-CM | POA: Diagnosis not present

## 2023-02-10 NOTE — Telephone Encounter (Signed)
Denies from [facility] called t# [phone] & doctor wants labs drawn on patient she will fax order & dx code per [facility] she can draw those TSH, Renin, Aldosterone & Cortisol.  [name] will have patient call & schedule appt for labs

## 2023-02-10 NOTE — Progress Notes (Unsigned)
  Cardiology Office Note:  .   Date:  02/10/2023  ID:  Devon Cook, DOB 18-Sep-1960, MRN [id number] PCP: Ronnald Nian, MD  [facility] Providers Cardiologist:  Meriam Sprague, MD { Click to update primary MD,subspecialty MD or APP then REFRESH:1}   Patient Profile: .      PMH: ESRD on HD M/W/F Renovascular hypertension Anemia Prostate cancer OSA Hyperlipidemia  Referred to cardiology and seen by Dr. Shari Prows 04/10/2020 for preoperative evaluation prior to bilateral ureteral stent replacement.  ED visit 10/05/2019 with palpitations that developed at HD. Thought to have SVT versus a flutter. Per Dr. Shari Prows, strips look like atrial flutter.  CHA2DS2-VASc score of 1 at that time, therefore no anticoagulation was started.  He had good functional capacity and able to perform >4 METS without cardiac issues.  Echocardiogram completed 04/27/2020 which revealed mildly reduced LVEF 45 to 50%, moderately elevated pulmonary pressures, and mildly dilated aorta.  He called our office 12/17/2021 to report palpitations and HR 130 bpm at HD.  He had resumed previously discontinued metoprolol on his own.  He was scheduled for follow-up with Dr. Shari Prows, however there is no record of return cardiology visit.  ED visit 01/14/2023 for complaints of chest pain.  He reported he felt generally unwell after dialysis the day prior and had noticed elevated blood pressures throughout the night.  EKG was unremarkable.Hs trops elevated at 88 >> 81 but consistent with previous findings 6 months prior.        History of Present Illness: Devon Cook is a *** 62 y.o. male who is here today   ROS: ***       Studies Reviewed: .        *** Risk Assessment/Calculations:   {Does this patient have ATRIAL FIBRILLATION?:[phone]} No BP recorded.  {Refresh Note OR Click here to enter BP  :1}***       Physical Exam:   VS:  There were no vitals taken for this visit.   Wt Readings from Last 3  Encounters:  01/19/23 280 lb (127 kg)  01/13/23 (!) 397 lb 4.3 oz (180.2 kg)  12/08/22 287 lb (130.2 kg)    GEN: Well nourished, well developed in no acute distress NECK: No JVD; No carotid bruits CARDIAC: ***RRR, no murmurs, rubs, gallops RESPIRATORY:  Clear to auscultation without rales, wheezing or rhonchi  ABDOMEN: Soft, non-tender, non-distended EXTREMITIES:  No edema; No deformity     ASSESSMENT AND PLAN: .    Chest pain:  PAF: Hypertension:    {Are you ordering a CV Procedure (e.g. stress test, cath, DCCV, TEE, etc)?   Press F2        :[id number]}  Dispo: ***  Signed, Eligha Bridegroom, NP-C

## 2023-02-13 ENCOUNTER — Ambulatory Visit: Payer: Medicare Other | Admitting: Nurse Practitioner

## 2023-02-13 ENCOUNTER — Encounter: Payer: Self-pay | Admitting: Nurse Practitioner

## 2023-02-13 VITALS — BP 150/92 | HR 75 | Ht 74.0 in | Wt 277.0 lb

## 2023-02-13 DIAGNOSIS — Z992 Dependence on renal dialysis: Secondary | ICD-10-CM

## 2023-02-13 DIAGNOSIS — I480 Paroxysmal atrial fibrillation: Secondary | ICD-10-CM | POA: Diagnosis not present

## 2023-02-13 DIAGNOSIS — I150 Renovascular hypertension: Secondary | ICD-10-CM | POA: Diagnosis not present

## 2023-02-13 DIAGNOSIS — I5022 Chronic systolic (congestive) heart failure: Secondary | ICD-10-CM

## 2023-02-13 DIAGNOSIS — N2581 Secondary hyperparathyroidism of renal origin: Secondary | ICD-10-CM | POA: Diagnosis not present

## 2023-02-13 DIAGNOSIS — N186 End stage renal disease: Secondary | ICD-10-CM

## 2023-02-13 DIAGNOSIS — D631 Anemia in chronic kidney disease: Secondary | ICD-10-CM | POA: Diagnosis not present

## 2023-02-13 DIAGNOSIS — N185 Chronic kidney disease, stage 5: Secondary | ICD-10-CM

## 2023-02-13 DIAGNOSIS — D689 Coagulation defect, unspecified: Secondary | ICD-10-CM | POA: Diagnosis not present

## 2023-02-13 DIAGNOSIS — T7840XA Allergy, unspecified, initial encounter: Secondary | ICD-10-CM | POA: Diagnosis not present

## 2023-02-13 DIAGNOSIS — R52 Pain, unspecified: Secondary | ICD-10-CM | POA: Diagnosis not present

## 2023-02-13 DIAGNOSIS — R079 Chest pain, unspecified: Secondary | ICD-10-CM

## 2023-02-13 DIAGNOSIS — I7121 Aneurysm of the ascending aorta, without rupture: Secondary | ICD-10-CM | POA: Diagnosis not present

## 2023-02-13 DIAGNOSIS — D509 Iron deficiency anemia, unspecified: Secondary | ICD-10-CM | POA: Diagnosis not present

## 2023-02-13 NOTE — Patient Instructions (Signed)
Medication Instructions:   Your physician recommends that you continue on your current medications as directed. Please refer to the Current Medication list given to you today.   *If you need a refill on your cardiac medications before your next appointment, please call your pharmacy*   Lab Work:  None ordered.  If you have labs (blood work) drawn today and your tests are completely normal, you will receive your results only by: MyChart Message (if you have MyChart) OR A paper copy in the mail If you have any lab test that is abnormal or we need to change your treatment, we will call you to review the results.   Testing/Procedures:  Pt will call back if pt decides to do a Echo.    Follow-Up: At [facility], you and your health needs are our priority.  As part of our continuing mission to provide you with exceptional heart care, we have created designated Provider Care Teams.  These Care Teams include your primary Cardiologist (physician) and Advanced Practice Providers (APPs -  Physician Assistants and Nurse Practitioners) who all work together to provide you with the care you need, when you need it.  We recommend signing up for the patient portal called "MyChart".  Sign up information is provided on this After Visit Summary.  MyChart is used to connect with patients for Virtual Visits (Telemedicine).  Patients are able to view lab/test results, encounter notes, upcoming appointments, etc.  Non-urgent messages can be sent to your provider as well.   To learn more about what you can do with MyChart, go to [email or web address].    Your next appointment:   6 month(s)  Provider:   Dr. Izora Ribas     Other Instructions  Your physician wants you to follow-up in: 6 months.  You will receive a reminder letter in the mail two months in advance. If you don't receive a letter, please call our office to schedule the follow-up appointment.

## 2023-02-13 NOTE — Telephone Encounter (Signed)
Denies from [facility] called t# [phone] & doctor wants labs drawn on patient she will fax order & dx code per [facility] she can draw those TSH, Renin, Aldosterone & Cortisol.  [name] will have patient call & schedule appt for labs   Pt has the written order, is it ok for him to come and have these labs done?

## 2023-02-15 DIAGNOSIS — R52 Pain, unspecified: Secondary | ICD-10-CM | POA: Diagnosis not present

## 2023-02-15 DIAGNOSIS — D509 Iron deficiency anemia, unspecified: Secondary | ICD-10-CM | POA: Diagnosis not present

## 2023-02-15 DIAGNOSIS — D631 Anemia in chronic kidney disease: Secondary | ICD-10-CM | POA: Diagnosis not present

## 2023-02-15 DIAGNOSIS — N2581 Secondary hyperparathyroidism of renal origin: Secondary | ICD-10-CM | POA: Diagnosis not present

## 2023-02-15 DIAGNOSIS — T7840XA Allergy, unspecified, initial encounter: Secondary | ICD-10-CM | POA: Diagnosis not present

## 2023-02-15 DIAGNOSIS — Z992 Dependence on renal dialysis: Secondary | ICD-10-CM | POA: Diagnosis not present

## 2023-02-15 DIAGNOSIS — N186 End stage renal disease: Secondary | ICD-10-CM | POA: Diagnosis not present

## 2023-02-15 DIAGNOSIS — D689 Coagulation defect, unspecified: Secondary | ICD-10-CM | POA: Diagnosis not present

## 2023-02-16 ENCOUNTER — Other Ambulatory Visit: Payer: Medicare Other

## 2023-02-16 DIAGNOSIS — I159 Secondary hypertension, unspecified: Secondary | ICD-10-CM | POA: Diagnosis not present

## 2023-02-17 ENCOUNTER — Encounter: Payer: Self-pay | Admitting: Family Medicine

## 2023-02-17 DIAGNOSIS — N186 End stage renal disease: Secondary | ICD-10-CM | POA: Diagnosis not present

## 2023-02-17 DIAGNOSIS — R52 Pain, unspecified: Secondary | ICD-10-CM | POA: Diagnosis not present

## 2023-02-17 DIAGNOSIS — D689 Coagulation defect, unspecified: Secondary | ICD-10-CM | POA: Diagnosis not present

## 2023-02-17 DIAGNOSIS — Z992 Dependence on renal dialysis: Secondary | ICD-10-CM | POA: Diagnosis not present

## 2023-02-17 DIAGNOSIS — D509 Iron deficiency anemia, unspecified: Secondary | ICD-10-CM | POA: Diagnosis not present

## 2023-02-17 DIAGNOSIS — T7840XA Allergy, unspecified, initial encounter: Secondary | ICD-10-CM | POA: Diagnosis not present

## 2023-02-17 DIAGNOSIS — N2581 Secondary hyperparathyroidism of renal origin: Secondary | ICD-10-CM | POA: Diagnosis not present

## 2023-02-17 DIAGNOSIS — D631 Anemia in chronic kidney disease: Secondary | ICD-10-CM | POA: Diagnosis not present

## 2023-02-19 ENCOUNTER — Encounter: Payer: Self-pay | Admitting: Family Medicine

## 2023-02-19 ENCOUNTER — Other Ambulatory Visit: Payer: Self-pay | Admitting: Family Medicine

## 2023-02-19 DIAGNOSIS — F419 Anxiety disorder, unspecified: Secondary | ICD-10-CM

## 2023-02-20 DIAGNOSIS — N186 End stage renal disease: Secondary | ICD-10-CM | POA: Diagnosis not present

## 2023-02-20 DIAGNOSIS — N2581 Secondary hyperparathyroidism of renal origin: Secondary | ICD-10-CM | POA: Diagnosis not present

## 2023-02-20 DIAGNOSIS — D509 Iron deficiency anemia, unspecified: Secondary | ICD-10-CM | POA: Diagnosis not present

## 2023-02-20 DIAGNOSIS — D631 Anemia in chronic kidney disease: Secondary | ICD-10-CM | POA: Diagnosis not present

## 2023-02-20 DIAGNOSIS — D689 Coagulation defect, unspecified: Secondary | ICD-10-CM | POA: Diagnosis not present

## 2023-02-20 DIAGNOSIS — T7840XA Allergy, unspecified, initial encounter: Secondary | ICD-10-CM | POA: Diagnosis not present

## 2023-02-20 DIAGNOSIS — Z992 Dependence on renal dialysis: Secondary | ICD-10-CM | POA: Diagnosis not present

## 2023-02-20 DIAGNOSIS — R52 Pain, unspecified: Secondary | ICD-10-CM | POA: Diagnosis not present

## 2023-02-20 MED ORDER — ALPRAZOLAM 0.25 MG PO TABS: 0.2500 mg | ORAL_TABLET | Freq: Every day | ORAL | 0 refills | Status: DC | PRN

## 2023-02-20 NOTE — Telephone Encounter (Signed)
DONE

## 2023-02-22 DIAGNOSIS — D509 Iron deficiency anemia, unspecified: Secondary | ICD-10-CM | POA: Diagnosis not present

## 2023-02-22 DIAGNOSIS — D689 Coagulation defect, unspecified: Secondary | ICD-10-CM | POA: Diagnosis not present

## 2023-02-22 DIAGNOSIS — Z992 Dependence on renal dialysis: Secondary | ICD-10-CM | POA: Diagnosis not present

## 2023-02-22 DIAGNOSIS — D631 Anemia in chronic kidney disease: Secondary | ICD-10-CM | POA: Diagnosis not present

## 2023-02-22 DIAGNOSIS — R52 Pain, unspecified: Secondary | ICD-10-CM | POA: Diagnosis not present

## 2023-02-22 DIAGNOSIS — N2581 Secondary hyperparathyroidism of renal origin: Secondary | ICD-10-CM | POA: Diagnosis not present

## 2023-02-22 DIAGNOSIS — N186 End stage renal disease: Secondary | ICD-10-CM | POA: Diagnosis not present

## 2023-02-22 DIAGNOSIS — T7840XA Allergy, unspecified, initial encounter: Secondary | ICD-10-CM | POA: Diagnosis not present

## 2023-02-24 DIAGNOSIS — Z992 Dependence on renal dialysis: Secondary | ICD-10-CM | POA: Diagnosis not present

## 2023-02-24 DIAGNOSIS — D509 Iron deficiency anemia, unspecified: Secondary | ICD-10-CM | POA: Diagnosis not present

## 2023-02-24 DIAGNOSIS — D631 Anemia in chronic kidney disease: Secondary | ICD-10-CM | POA: Diagnosis not present

## 2023-02-24 DIAGNOSIS — R52 Pain, unspecified: Secondary | ICD-10-CM | POA: Diagnosis not present

## 2023-02-24 DIAGNOSIS — T7840XA Allergy, unspecified, initial encounter: Secondary | ICD-10-CM | POA: Diagnosis not present

## 2023-02-24 DIAGNOSIS — D689 Coagulation defect, unspecified: Secondary | ICD-10-CM | POA: Diagnosis not present

## 2023-02-24 DIAGNOSIS — N186 End stage renal disease: Secondary | ICD-10-CM | POA: Diagnosis not present

## 2023-02-24 DIAGNOSIS — N2581 Secondary hyperparathyroidism of renal origin: Secondary | ICD-10-CM | POA: Diagnosis not present

## 2023-02-27 DIAGNOSIS — N186 End stage renal disease: Secondary | ICD-10-CM | POA: Diagnosis not present

## 2023-02-27 DIAGNOSIS — D509 Iron deficiency anemia, unspecified: Secondary | ICD-10-CM | POA: Diagnosis not present

## 2023-02-27 DIAGNOSIS — T7840XA Allergy, unspecified, initial encounter: Secondary | ICD-10-CM | POA: Diagnosis not present

## 2023-02-27 DIAGNOSIS — R52 Pain, unspecified: Secondary | ICD-10-CM | POA: Diagnosis not present

## 2023-02-27 DIAGNOSIS — D631 Anemia in chronic kidney disease: Secondary | ICD-10-CM | POA: Diagnosis not present

## 2023-02-27 DIAGNOSIS — D689 Coagulation defect, unspecified: Secondary | ICD-10-CM | POA: Diagnosis not present

## 2023-02-27 DIAGNOSIS — Z992 Dependence on renal dialysis: Secondary | ICD-10-CM | POA: Diagnosis not present

## 2023-02-27 DIAGNOSIS — N2581 Secondary hyperparathyroidism of renal origin: Secondary | ICD-10-CM | POA: Diagnosis not present

## 2023-02-27 LAB — METANEPHRINES, PLASMA
Metanephrine, Free: <25 pg/mL (ref 0.0–88.0)
Normetanephrine, Free: 145.8 pg/mL (ref 0.0–285.2)

## 2023-02-27 LAB — ALDOSTERONE + RENIN ACTIVITY W/ RATIO
Aldos/Renin Ratio: 30.6 — ABNORMAL HIGH (ref 0.0–30.0)
Aldosterone: 6 ng/dL (ref 0.0–30.0)
Renin Activity, Plasma: 0.196 ng/mL/h (ref 0.167–5.380)

## 2023-02-27 LAB — CORTISOL: Cortisol: 16.1 ug/dL (ref 6.2–19.4)

## 2023-02-27 LAB — TSH: TSH: 2.97 u[IU]/mL (ref 0.450–4.500)

## 2023-03-01 DIAGNOSIS — D509 Iron deficiency anemia, unspecified: Secondary | ICD-10-CM | POA: Diagnosis not present

## 2023-03-01 DIAGNOSIS — N2581 Secondary hyperparathyroidism of renal origin: Secondary | ICD-10-CM | POA: Diagnosis not present

## 2023-03-01 DIAGNOSIS — Z992 Dependence on renal dialysis: Secondary | ICD-10-CM | POA: Diagnosis not present

## 2023-03-01 DIAGNOSIS — D631 Anemia in chronic kidney disease: Secondary | ICD-10-CM | POA: Diagnosis not present

## 2023-03-01 DIAGNOSIS — R52 Pain, unspecified: Secondary | ICD-10-CM | POA: Diagnosis not present

## 2023-03-01 DIAGNOSIS — D689 Coagulation defect, unspecified: Secondary | ICD-10-CM | POA: Diagnosis not present

## 2023-03-01 DIAGNOSIS — N186 End stage renal disease: Secondary | ICD-10-CM | POA: Diagnosis not present

## 2023-03-01 DIAGNOSIS — T7840XA Allergy, unspecified, initial encounter: Secondary | ICD-10-CM | POA: Diagnosis not present

## 2023-03-03 DIAGNOSIS — N186 End stage renal disease: Secondary | ICD-10-CM | POA: Diagnosis not present

## 2023-03-03 DIAGNOSIS — N2581 Secondary hyperparathyroidism of renal origin: Secondary | ICD-10-CM | POA: Diagnosis not present

## 2023-03-03 DIAGNOSIS — Z992 Dependence on renal dialysis: Secondary | ICD-10-CM | POA: Diagnosis not present

## 2023-03-03 DIAGNOSIS — D631 Anemia in chronic kidney disease: Secondary | ICD-10-CM | POA: Diagnosis not present

## 2023-03-03 DIAGNOSIS — R52 Pain, unspecified: Secondary | ICD-10-CM | POA: Diagnosis not present

## 2023-03-03 DIAGNOSIS — T7840XA Allergy, unspecified, initial encounter: Secondary | ICD-10-CM | POA: Diagnosis not present

## 2023-03-03 DIAGNOSIS — D509 Iron deficiency anemia, unspecified: Secondary | ICD-10-CM | POA: Diagnosis not present

## 2023-03-03 DIAGNOSIS — D689 Coagulation defect, unspecified: Secondary | ICD-10-CM | POA: Diagnosis not present

## 2023-03-04 DIAGNOSIS — Z992 Dependence on renal dialysis: Secondary | ICD-10-CM | POA: Diagnosis not present

## 2023-03-04 DIAGNOSIS — N138 Other obstructive and reflux uropathy: Secondary | ICD-10-CM | POA: Diagnosis not present

## 2023-03-04 DIAGNOSIS — N186 End stage renal disease: Secondary | ICD-10-CM | POA: Diagnosis not present

## 2023-03-05 ENCOUNTER — Other Ambulatory Visit: Payer: Self-pay | Admitting: Family Medicine

## 2023-03-06 DIAGNOSIS — T7840XA Allergy, unspecified, initial encounter: Secondary | ICD-10-CM | POA: Diagnosis not present

## 2023-03-06 DIAGNOSIS — D509 Iron deficiency anemia, unspecified: Secondary | ICD-10-CM | POA: Diagnosis not present

## 2023-03-06 DIAGNOSIS — Z992 Dependence on renal dialysis: Secondary | ICD-10-CM | POA: Diagnosis not present

## 2023-03-06 DIAGNOSIS — N186 End stage renal disease: Secondary | ICD-10-CM | POA: Diagnosis not present

## 2023-03-06 DIAGNOSIS — N2581 Secondary hyperparathyroidism of renal origin: Secondary | ICD-10-CM | POA: Diagnosis not present

## 2023-03-06 DIAGNOSIS — D631 Anemia in chronic kidney disease: Secondary | ICD-10-CM | POA: Diagnosis not present

## 2023-03-06 DIAGNOSIS — R197 Diarrhea, unspecified: Secondary | ICD-10-CM | POA: Diagnosis not present

## 2023-03-06 DIAGNOSIS — D689 Coagulation defect, unspecified: Secondary | ICD-10-CM | POA: Diagnosis not present

## 2023-03-06 DIAGNOSIS — R52 Pain, unspecified: Secondary | ICD-10-CM | POA: Diagnosis not present

## 2023-03-08 DIAGNOSIS — N186 End stage renal disease: Secondary | ICD-10-CM | POA: Diagnosis not present

## 2023-03-08 DIAGNOSIS — R197 Diarrhea, unspecified: Secondary | ICD-10-CM | POA: Diagnosis not present

## 2023-03-08 DIAGNOSIS — Z992 Dependence on renal dialysis: Secondary | ICD-10-CM | POA: Diagnosis not present

## 2023-03-08 DIAGNOSIS — D509 Iron deficiency anemia, unspecified: Secondary | ICD-10-CM | POA: Diagnosis not present

## 2023-03-08 DIAGNOSIS — R52 Pain, unspecified: Secondary | ICD-10-CM | POA: Diagnosis not present

## 2023-03-08 DIAGNOSIS — D689 Coagulation defect, unspecified: Secondary | ICD-10-CM | POA: Diagnosis not present

## 2023-03-08 DIAGNOSIS — T7840XA Allergy, unspecified, initial encounter: Secondary | ICD-10-CM | POA: Diagnosis not present

## 2023-03-08 DIAGNOSIS — D631 Anemia in chronic kidney disease: Secondary | ICD-10-CM | POA: Diagnosis not present

## 2023-03-08 DIAGNOSIS — N2581 Secondary hyperparathyroidism of renal origin: Secondary | ICD-10-CM | POA: Diagnosis not present

## 2023-03-10 DIAGNOSIS — D631 Anemia in chronic kidney disease: Secondary | ICD-10-CM | POA: Diagnosis not present

## 2023-03-10 DIAGNOSIS — R52 Pain, unspecified: Secondary | ICD-10-CM | POA: Diagnosis not present

## 2023-03-10 DIAGNOSIS — N186 End stage renal disease: Secondary | ICD-10-CM | POA: Diagnosis not present

## 2023-03-10 DIAGNOSIS — T7840XA Allergy, unspecified, initial encounter: Secondary | ICD-10-CM | POA: Diagnosis not present

## 2023-03-10 DIAGNOSIS — D509 Iron deficiency anemia, unspecified: Secondary | ICD-10-CM | POA: Diagnosis not present

## 2023-03-10 DIAGNOSIS — D689 Coagulation defect, unspecified: Secondary | ICD-10-CM | POA: Diagnosis not present

## 2023-03-10 DIAGNOSIS — N2581 Secondary hyperparathyroidism of renal origin: Secondary | ICD-10-CM | POA: Diagnosis not present

## 2023-03-10 DIAGNOSIS — R197 Diarrhea, unspecified: Secondary | ICD-10-CM | POA: Diagnosis not present

## 2023-03-10 DIAGNOSIS — Z992 Dependence on renal dialysis: Secondary | ICD-10-CM | POA: Diagnosis not present

## 2023-03-13 DIAGNOSIS — D509 Iron deficiency anemia, unspecified: Secondary | ICD-10-CM | POA: Diagnosis not present

## 2023-03-13 DIAGNOSIS — T7840XA Allergy, unspecified, initial encounter: Secondary | ICD-10-CM | POA: Diagnosis not present

## 2023-03-13 DIAGNOSIS — D689 Coagulation defect, unspecified: Secondary | ICD-10-CM | POA: Diagnosis not present

## 2023-03-13 DIAGNOSIS — Z992 Dependence on renal dialysis: Secondary | ICD-10-CM | POA: Diagnosis not present

## 2023-03-13 DIAGNOSIS — R197 Diarrhea, unspecified: Secondary | ICD-10-CM | POA: Diagnosis not present

## 2023-03-13 DIAGNOSIS — N2581 Secondary hyperparathyroidism of renal origin: Secondary | ICD-10-CM | POA: Diagnosis not present

## 2023-03-13 DIAGNOSIS — D631 Anemia in chronic kidney disease: Secondary | ICD-10-CM | POA: Diagnosis not present

## 2023-03-13 DIAGNOSIS — N186 End stage renal disease: Secondary | ICD-10-CM | POA: Diagnosis not present

## 2023-03-13 DIAGNOSIS — R52 Pain, unspecified: Secondary | ICD-10-CM | POA: Diagnosis not present

## 2023-03-15 DIAGNOSIS — N2581 Secondary hyperparathyroidism of renal origin: Secondary | ICD-10-CM | POA: Diagnosis not present

## 2023-03-15 DIAGNOSIS — N186 End stage renal disease: Secondary | ICD-10-CM | POA: Diagnosis not present

## 2023-03-15 DIAGNOSIS — T7840XA Allergy, unspecified, initial encounter: Secondary | ICD-10-CM | POA: Diagnosis not present

## 2023-03-15 DIAGNOSIS — R197 Diarrhea, unspecified: Secondary | ICD-10-CM | POA: Diagnosis not present

## 2023-03-15 DIAGNOSIS — R52 Pain, unspecified: Secondary | ICD-10-CM | POA: Diagnosis not present

## 2023-03-15 DIAGNOSIS — D689 Coagulation defect, unspecified: Secondary | ICD-10-CM | POA: Diagnosis not present

## 2023-03-15 DIAGNOSIS — Z992 Dependence on renal dialysis: Secondary | ICD-10-CM | POA: Diagnosis not present

## 2023-03-15 DIAGNOSIS — D509 Iron deficiency anemia, unspecified: Secondary | ICD-10-CM | POA: Diagnosis not present

## 2023-03-15 DIAGNOSIS — D631 Anemia in chronic kidney disease: Secondary | ICD-10-CM | POA: Diagnosis not present

## 2023-03-17 DIAGNOSIS — R197 Diarrhea, unspecified: Secondary | ICD-10-CM | POA: Diagnosis not present

## 2023-03-17 DIAGNOSIS — N2581 Secondary hyperparathyroidism of renal origin: Secondary | ICD-10-CM | POA: Diagnosis not present

## 2023-03-17 DIAGNOSIS — D689 Coagulation defect, unspecified: Secondary | ICD-10-CM | POA: Diagnosis not present

## 2023-03-17 DIAGNOSIS — R52 Pain, unspecified: Secondary | ICD-10-CM | POA: Diagnosis not present

## 2023-03-17 DIAGNOSIS — D631 Anemia in chronic kidney disease: Secondary | ICD-10-CM | POA: Diagnosis not present

## 2023-03-17 DIAGNOSIS — D509 Iron deficiency anemia, unspecified: Secondary | ICD-10-CM | POA: Diagnosis not present

## 2023-03-17 DIAGNOSIS — Z992 Dependence on renal dialysis: Secondary | ICD-10-CM | POA: Diagnosis not present

## 2023-03-17 DIAGNOSIS — N186 End stage renal disease: Secondary | ICD-10-CM | POA: Diagnosis not present

## 2023-03-17 DIAGNOSIS — T7840XA Allergy, unspecified, initial encounter: Secondary | ICD-10-CM | POA: Diagnosis not present

## 2023-03-20 DIAGNOSIS — D631 Anemia in chronic kidney disease: Secondary | ICD-10-CM | POA: Diagnosis not present

## 2023-03-20 DIAGNOSIS — N186 End stage renal disease: Secondary | ICD-10-CM | POA: Diagnosis not present

## 2023-03-20 DIAGNOSIS — D509 Iron deficiency anemia, unspecified: Secondary | ICD-10-CM | POA: Diagnosis not present

## 2023-03-20 DIAGNOSIS — N2581 Secondary hyperparathyroidism of renal origin: Secondary | ICD-10-CM | POA: Diagnosis not present

## 2023-03-20 DIAGNOSIS — T7840XA Allergy, unspecified, initial encounter: Secondary | ICD-10-CM | POA: Diagnosis not present

## 2023-03-20 DIAGNOSIS — R52 Pain, unspecified: Secondary | ICD-10-CM | POA: Diagnosis not present

## 2023-03-20 DIAGNOSIS — Z992 Dependence on renal dialysis: Secondary | ICD-10-CM | POA: Diagnosis not present

## 2023-03-20 DIAGNOSIS — R197 Diarrhea, unspecified: Secondary | ICD-10-CM | POA: Diagnosis not present

## 2023-03-20 DIAGNOSIS — D689 Coagulation defect, unspecified: Secondary | ICD-10-CM | POA: Diagnosis not present

## 2023-03-22 DIAGNOSIS — D689 Coagulation defect, unspecified: Secondary | ICD-10-CM | POA: Diagnosis not present

## 2023-03-22 DIAGNOSIS — R197 Diarrhea, unspecified: Secondary | ICD-10-CM | POA: Diagnosis not present

## 2023-03-22 DIAGNOSIS — N2581 Secondary hyperparathyroidism of renal origin: Secondary | ICD-10-CM | POA: Diagnosis not present

## 2023-03-22 DIAGNOSIS — D509 Iron deficiency anemia, unspecified: Secondary | ICD-10-CM | POA: Diagnosis not present

## 2023-03-22 DIAGNOSIS — D631 Anemia in chronic kidney disease: Secondary | ICD-10-CM | POA: Diagnosis not present

## 2023-03-22 DIAGNOSIS — T7840XA Allergy, unspecified, initial encounter: Secondary | ICD-10-CM | POA: Diagnosis not present

## 2023-03-22 DIAGNOSIS — Z992 Dependence on renal dialysis: Secondary | ICD-10-CM | POA: Diagnosis not present

## 2023-03-22 DIAGNOSIS — R52 Pain, unspecified: Secondary | ICD-10-CM | POA: Diagnosis not present

## 2023-03-22 DIAGNOSIS — N186 End stage renal disease: Secondary | ICD-10-CM | POA: Diagnosis not present

## 2023-03-24 DIAGNOSIS — D689 Coagulation defect, unspecified: Secondary | ICD-10-CM | POA: Diagnosis not present

## 2023-03-24 DIAGNOSIS — N2581 Secondary hyperparathyroidism of renal origin: Secondary | ICD-10-CM | POA: Diagnosis not present

## 2023-03-24 DIAGNOSIS — R197 Diarrhea, unspecified: Secondary | ICD-10-CM | POA: Diagnosis not present

## 2023-03-24 DIAGNOSIS — D509 Iron deficiency anemia, unspecified: Secondary | ICD-10-CM | POA: Diagnosis not present

## 2023-03-24 DIAGNOSIS — N186 End stage renal disease: Secondary | ICD-10-CM | POA: Diagnosis not present

## 2023-03-24 DIAGNOSIS — D631 Anemia in chronic kidney disease: Secondary | ICD-10-CM | POA: Diagnosis not present

## 2023-03-24 DIAGNOSIS — Z992 Dependence on renal dialysis: Secondary | ICD-10-CM | POA: Diagnosis not present

## 2023-03-24 DIAGNOSIS — T7840XA Allergy, unspecified, initial encounter: Secondary | ICD-10-CM | POA: Diagnosis not present

## 2023-03-24 DIAGNOSIS — R52 Pain, unspecified: Secondary | ICD-10-CM | POA: Diagnosis not present

## 2023-03-27 DIAGNOSIS — D631 Anemia in chronic kidney disease: Secondary | ICD-10-CM | POA: Diagnosis not present

## 2023-03-27 DIAGNOSIS — R197 Diarrhea, unspecified: Secondary | ICD-10-CM | POA: Diagnosis not present

## 2023-03-27 DIAGNOSIS — N186 End stage renal disease: Secondary | ICD-10-CM | POA: Diagnosis not present

## 2023-03-27 DIAGNOSIS — T7840XA Allergy, unspecified, initial encounter: Secondary | ICD-10-CM | POA: Diagnosis not present

## 2023-03-27 DIAGNOSIS — Z992 Dependence on renal dialysis: Secondary | ICD-10-CM | POA: Diagnosis not present

## 2023-03-27 DIAGNOSIS — D689 Coagulation defect, unspecified: Secondary | ICD-10-CM | POA: Diagnosis not present

## 2023-03-27 DIAGNOSIS — D509 Iron deficiency anemia, unspecified: Secondary | ICD-10-CM | POA: Diagnosis not present

## 2023-03-27 DIAGNOSIS — N2581 Secondary hyperparathyroidism of renal origin: Secondary | ICD-10-CM | POA: Diagnosis not present

## 2023-03-27 DIAGNOSIS — R52 Pain, unspecified: Secondary | ICD-10-CM | POA: Diagnosis not present

## 2023-03-29 DIAGNOSIS — N186 End stage renal disease: Secondary | ICD-10-CM | POA: Diagnosis not present

## 2023-03-29 DIAGNOSIS — R197 Diarrhea, unspecified: Secondary | ICD-10-CM | POA: Diagnosis not present

## 2023-03-29 DIAGNOSIS — T7840XA Allergy, unspecified, initial encounter: Secondary | ICD-10-CM | POA: Diagnosis not present

## 2023-03-29 DIAGNOSIS — D631 Anemia in chronic kidney disease: Secondary | ICD-10-CM | POA: Diagnosis not present

## 2023-03-29 DIAGNOSIS — R52 Pain, unspecified: Secondary | ICD-10-CM | POA: Diagnosis not present

## 2023-03-29 DIAGNOSIS — D689 Coagulation defect, unspecified: Secondary | ICD-10-CM | POA: Diagnosis not present

## 2023-03-29 DIAGNOSIS — Z992 Dependence on renal dialysis: Secondary | ICD-10-CM | POA: Diagnosis not present

## 2023-03-29 DIAGNOSIS — D509 Iron deficiency anemia, unspecified: Secondary | ICD-10-CM | POA: Diagnosis not present

## 2023-03-29 DIAGNOSIS — N2581 Secondary hyperparathyroidism of renal origin: Secondary | ICD-10-CM | POA: Diagnosis not present

## 2023-03-31 DIAGNOSIS — D631 Anemia in chronic kidney disease: Secondary | ICD-10-CM | POA: Diagnosis not present

## 2023-03-31 DIAGNOSIS — N186 End stage renal disease: Secondary | ICD-10-CM | POA: Diagnosis not present

## 2023-03-31 DIAGNOSIS — D689 Coagulation defect, unspecified: Secondary | ICD-10-CM | POA: Diagnosis not present

## 2023-03-31 DIAGNOSIS — N2581 Secondary hyperparathyroidism of renal origin: Secondary | ICD-10-CM | POA: Diagnosis not present

## 2023-03-31 DIAGNOSIS — Z992 Dependence on renal dialysis: Secondary | ICD-10-CM | POA: Diagnosis not present

## 2023-03-31 DIAGNOSIS — R197 Diarrhea, unspecified: Secondary | ICD-10-CM | POA: Diagnosis not present

## 2023-03-31 DIAGNOSIS — T7840XA Allergy, unspecified, initial encounter: Secondary | ICD-10-CM | POA: Diagnosis not present

## 2023-03-31 DIAGNOSIS — R52 Pain, unspecified: Secondary | ICD-10-CM | POA: Diagnosis not present

## 2023-03-31 DIAGNOSIS — D509 Iron deficiency anemia, unspecified: Secondary | ICD-10-CM | POA: Diagnosis not present

## 2023-04-03 DIAGNOSIS — D509 Iron deficiency anemia, unspecified: Secondary | ICD-10-CM | POA: Diagnosis not present

## 2023-04-03 DIAGNOSIS — N138 Other obstructive and reflux uropathy: Secondary | ICD-10-CM | POA: Diagnosis not present

## 2023-04-03 DIAGNOSIS — R52 Pain, unspecified: Secondary | ICD-10-CM | POA: Diagnosis not present

## 2023-04-03 DIAGNOSIS — N2581 Secondary hyperparathyroidism of renal origin: Secondary | ICD-10-CM | POA: Diagnosis not present

## 2023-04-03 DIAGNOSIS — T7840XA Allergy, unspecified, initial encounter: Secondary | ICD-10-CM | POA: Diagnosis not present

## 2023-04-03 DIAGNOSIS — R197 Diarrhea, unspecified: Secondary | ICD-10-CM | POA: Diagnosis not present

## 2023-04-03 DIAGNOSIS — D631 Anemia in chronic kidney disease: Secondary | ICD-10-CM | POA: Diagnosis not present

## 2023-04-03 DIAGNOSIS — N186 End stage renal disease: Secondary | ICD-10-CM | POA: Diagnosis not present

## 2023-04-03 DIAGNOSIS — D689 Coagulation defect, unspecified: Secondary | ICD-10-CM | POA: Diagnosis not present

## 2023-04-03 DIAGNOSIS — Z992 Dependence on renal dialysis: Secondary | ICD-10-CM | POA: Diagnosis not present

## 2023-04-05 DIAGNOSIS — D631 Anemia in chronic kidney disease: Secondary | ICD-10-CM | POA: Diagnosis not present

## 2023-04-05 DIAGNOSIS — D509 Iron deficiency anemia, unspecified: Secondary | ICD-10-CM | POA: Diagnosis not present

## 2023-04-05 DIAGNOSIS — Z23 Encounter for immunization: Secondary | ICD-10-CM | POA: Diagnosis not present

## 2023-04-05 DIAGNOSIS — N186 End stage renal disease: Secondary | ICD-10-CM | POA: Diagnosis not present

## 2023-04-05 DIAGNOSIS — N2581 Secondary hyperparathyroidism of renal origin: Secondary | ICD-10-CM | POA: Diagnosis not present

## 2023-04-05 DIAGNOSIS — Z992 Dependence on renal dialysis: Secondary | ICD-10-CM | POA: Diagnosis not present

## 2023-04-05 DIAGNOSIS — R52 Pain, unspecified: Secondary | ICD-10-CM | POA: Diagnosis not present

## 2023-04-05 DIAGNOSIS — T7840XA Allergy, unspecified, initial encounter: Secondary | ICD-10-CM | POA: Diagnosis not present

## 2023-04-05 DIAGNOSIS — D689 Coagulation defect, unspecified: Secondary | ICD-10-CM | POA: Diagnosis not present

## 2023-04-07 DIAGNOSIS — N186 End stage renal disease: Secondary | ICD-10-CM | POA: Diagnosis not present

## 2023-04-07 DIAGNOSIS — Z23 Encounter for immunization: Secondary | ICD-10-CM | POA: Diagnosis not present

## 2023-04-07 DIAGNOSIS — M9903 Segmental and somatic dysfunction of lumbar region: Secondary | ICD-10-CM | POA: Diagnosis not present

## 2023-04-07 DIAGNOSIS — D509 Iron deficiency anemia, unspecified: Secondary | ICD-10-CM | POA: Diagnosis not present

## 2023-04-07 DIAGNOSIS — Z992 Dependence on renal dialysis: Secondary | ICD-10-CM | POA: Diagnosis not present

## 2023-04-07 DIAGNOSIS — D631 Anemia in chronic kidney disease: Secondary | ICD-10-CM | POA: Diagnosis not present

## 2023-04-07 DIAGNOSIS — M9902 Segmental and somatic dysfunction of thoracic region: Secondary | ICD-10-CM | POA: Diagnosis not present

## 2023-04-07 DIAGNOSIS — R52 Pain, unspecified: Secondary | ICD-10-CM | POA: Diagnosis not present

## 2023-04-07 DIAGNOSIS — M9901 Segmental and somatic dysfunction of cervical region: Secondary | ICD-10-CM | POA: Diagnosis not present

## 2023-04-07 DIAGNOSIS — D689 Coagulation defect, unspecified: Secondary | ICD-10-CM | POA: Diagnosis not present

## 2023-04-07 DIAGNOSIS — M9907 Segmental and somatic dysfunction of upper extremity: Secondary | ICD-10-CM | POA: Diagnosis not present

## 2023-04-07 DIAGNOSIS — N2581 Secondary hyperparathyroidism of renal origin: Secondary | ICD-10-CM | POA: Diagnosis not present

## 2023-04-07 DIAGNOSIS — T7840XA Allergy, unspecified, initial encounter: Secondary | ICD-10-CM | POA: Diagnosis not present

## 2023-04-10 DIAGNOSIS — T7840XA Allergy, unspecified, initial encounter: Secondary | ICD-10-CM | POA: Diagnosis not present

## 2023-04-10 DIAGNOSIS — D689 Coagulation defect, unspecified: Secondary | ICD-10-CM | POA: Diagnosis not present

## 2023-04-10 DIAGNOSIS — D509 Iron deficiency anemia, unspecified: Secondary | ICD-10-CM | POA: Diagnosis not present

## 2023-04-10 DIAGNOSIS — D631 Anemia in chronic kidney disease: Secondary | ICD-10-CM | POA: Diagnosis not present

## 2023-04-10 DIAGNOSIS — N2581 Secondary hyperparathyroidism of renal origin: Secondary | ICD-10-CM | POA: Diagnosis not present

## 2023-04-10 DIAGNOSIS — R52 Pain, unspecified: Secondary | ICD-10-CM | POA: Diagnosis not present

## 2023-04-10 DIAGNOSIS — Z992 Dependence on renal dialysis: Secondary | ICD-10-CM | POA: Diagnosis not present

## 2023-04-10 DIAGNOSIS — Z23 Encounter for immunization: Secondary | ICD-10-CM | POA: Diagnosis not present

## 2023-04-10 DIAGNOSIS — N186 End stage renal disease: Secondary | ICD-10-CM | POA: Diagnosis not present

## 2023-04-12 DIAGNOSIS — T7840XA Allergy, unspecified, initial encounter: Secondary | ICD-10-CM | POA: Diagnosis not present

## 2023-04-12 DIAGNOSIS — D689 Coagulation defect, unspecified: Secondary | ICD-10-CM | POA: Diagnosis not present

## 2023-04-12 DIAGNOSIS — R52 Pain, unspecified: Secondary | ICD-10-CM | POA: Diagnosis not present

## 2023-04-12 DIAGNOSIS — D631 Anemia in chronic kidney disease: Secondary | ICD-10-CM | POA: Diagnosis not present

## 2023-04-12 DIAGNOSIS — Z992 Dependence on renal dialysis: Secondary | ICD-10-CM | POA: Diagnosis not present

## 2023-04-12 DIAGNOSIS — D509 Iron deficiency anemia, unspecified: Secondary | ICD-10-CM | POA: Diagnosis not present

## 2023-04-12 DIAGNOSIS — N186 End stage renal disease: Secondary | ICD-10-CM | POA: Diagnosis not present

## 2023-04-12 DIAGNOSIS — N2581 Secondary hyperparathyroidism of renal origin: Secondary | ICD-10-CM | POA: Diagnosis not present

## 2023-04-12 DIAGNOSIS — Z23 Encounter for immunization: Secondary | ICD-10-CM | POA: Diagnosis not present

## 2023-04-14 DIAGNOSIS — D509 Iron deficiency anemia, unspecified: Secondary | ICD-10-CM | POA: Diagnosis not present

## 2023-04-14 DIAGNOSIS — N2581 Secondary hyperparathyroidism of renal origin: Secondary | ICD-10-CM | POA: Diagnosis not present

## 2023-04-14 DIAGNOSIS — Z23 Encounter for immunization: Secondary | ICD-10-CM | POA: Diagnosis not present

## 2023-04-14 DIAGNOSIS — Z992 Dependence on renal dialysis: Secondary | ICD-10-CM | POA: Diagnosis not present

## 2023-04-14 DIAGNOSIS — D631 Anemia in chronic kidney disease: Secondary | ICD-10-CM | POA: Diagnosis not present

## 2023-04-14 DIAGNOSIS — D689 Coagulation defect, unspecified: Secondary | ICD-10-CM | POA: Diagnosis not present

## 2023-04-14 DIAGNOSIS — N186 End stage renal disease: Secondary | ICD-10-CM | POA: Diagnosis not present

## 2023-04-14 DIAGNOSIS — R52 Pain, unspecified: Secondary | ICD-10-CM | POA: Diagnosis not present

## 2023-04-14 DIAGNOSIS — T7840XA Allergy, unspecified, initial encounter: Secondary | ICD-10-CM | POA: Diagnosis not present

## 2023-04-17 DIAGNOSIS — Z23 Encounter for immunization: Secondary | ICD-10-CM | POA: Diagnosis not present

## 2023-04-17 DIAGNOSIS — N186 End stage renal disease: Secondary | ICD-10-CM | POA: Diagnosis not present

## 2023-04-17 DIAGNOSIS — N2581 Secondary hyperparathyroidism of renal origin: Secondary | ICD-10-CM | POA: Diagnosis not present

## 2023-04-17 DIAGNOSIS — D631 Anemia in chronic kidney disease: Secondary | ICD-10-CM | POA: Diagnosis not present

## 2023-04-17 DIAGNOSIS — T7840XA Allergy, unspecified, initial encounter: Secondary | ICD-10-CM | POA: Diagnosis not present

## 2023-04-17 DIAGNOSIS — R52 Pain, unspecified: Secondary | ICD-10-CM | POA: Diagnosis not present

## 2023-04-17 DIAGNOSIS — D509 Iron deficiency anemia, unspecified: Secondary | ICD-10-CM | POA: Diagnosis not present

## 2023-04-17 DIAGNOSIS — D689 Coagulation defect, unspecified: Secondary | ICD-10-CM | POA: Diagnosis not present

## 2023-04-17 DIAGNOSIS — Z992 Dependence on renal dialysis: Secondary | ICD-10-CM | POA: Diagnosis not present

## 2023-04-19 DIAGNOSIS — D509 Iron deficiency anemia, unspecified: Secondary | ICD-10-CM | POA: Diagnosis not present

## 2023-04-19 DIAGNOSIS — R52 Pain, unspecified: Secondary | ICD-10-CM | POA: Diagnosis not present

## 2023-04-19 DIAGNOSIS — N2581 Secondary hyperparathyroidism of renal origin: Secondary | ICD-10-CM | POA: Diagnosis not present

## 2023-04-19 DIAGNOSIS — Z992 Dependence on renal dialysis: Secondary | ICD-10-CM | POA: Diagnosis not present

## 2023-04-19 DIAGNOSIS — Z23 Encounter for immunization: Secondary | ICD-10-CM | POA: Diagnosis not present

## 2023-04-19 DIAGNOSIS — T7840XA Allergy, unspecified, initial encounter: Secondary | ICD-10-CM | POA: Diagnosis not present

## 2023-04-19 DIAGNOSIS — D631 Anemia in chronic kidney disease: Secondary | ICD-10-CM | POA: Diagnosis not present

## 2023-04-19 DIAGNOSIS — D689 Coagulation defect, unspecified: Secondary | ICD-10-CM | POA: Diagnosis not present

## 2023-04-19 DIAGNOSIS — N186 End stage renal disease: Secondary | ICD-10-CM | POA: Diagnosis not present

## 2023-04-21 DIAGNOSIS — Z23 Encounter for immunization: Secondary | ICD-10-CM | POA: Diagnosis not present

## 2023-04-21 DIAGNOSIS — R52 Pain, unspecified: Secondary | ICD-10-CM | POA: Diagnosis not present

## 2023-04-21 DIAGNOSIS — D631 Anemia in chronic kidney disease: Secondary | ICD-10-CM | POA: Diagnosis not present

## 2023-04-21 DIAGNOSIS — N2581 Secondary hyperparathyroidism of renal origin: Secondary | ICD-10-CM | POA: Diagnosis not present

## 2023-04-21 DIAGNOSIS — D509 Iron deficiency anemia, unspecified: Secondary | ICD-10-CM | POA: Diagnosis not present

## 2023-04-21 DIAGNOSIS — D689 Coagulation defect, unspecified: Secondary | ICD-10-CM | POA: Diagnosis not present

## 2023-04-21 DIAGNOSIS — N186 End stage renal disease: Secondary | ICD-10-CM | POA: Diagnosis not present

## 2023-04-21 DIAGNOSIS — Z992 Dependence on renal dialysis: Secondary | ICD-10-CM | POA: Diagnosis not present

## 2023-04-21 DIAGNOSIS — T7840XA Allergy, unspecified, initial encounter: Secondary | ICD-10-CM | POA: Diagnosis not present

## 2023-04-24 DIAGNOSIS — T7840XA Allergy, unspecified, initial encounter: Secondary | ICD-10-CM | POA: Diagnosis not present

## 2023-04-24 DIAGNOSIS — N186 End stage renal disease: Secondary | ICD-10-CM | POA: Diagnosis not present

## 2023-04-24 DIAGNOSIS — D509 Iron deficiency anemia, unspecified: Secondary | ICD-10-CM | POA: Diagnosis not present

## 2023-04-24 DIAGNOSIS — N2581 Secondary hyperparathyroidism of renal origin: Secondary | ICD-10-CM | POA: Diagnosis not present

## 2023-04-24 DIAGNOSIS — D631 Anemia in chronic kidney disease: Secondary | ICD-10-CM | POA: Diagnosis not present

## 2023-04-24 DIAGNOSIS — D689 Coagulation defect, unspecified: Secondary | ICD-10-CM | POA: Diagnosis not present

## 2023-04-24 DIAGNOSIS — Z992 Dependence on renal dialysis: Secondary | ICD-10-CM | POA: Diagnosis not present

## 2023-04-24 DIAGNOSIS — R52 Pain, unspecified: Secondary | ICD-10-CM | POA: Diagnosis not present

## 2023-04-24 DIAGNOSIS — Z23 Encounter for immunization: Secondary | ICD-10-CM | POA: Diagnosis not present

## 2023-04-26 DIAGNOSIS — D631 Anemia in chronic kidney disease: Secondary | ICD-10-CM | POA: Diagnosis not present

## 2023-04-26 DIAGNOSIS — N2581 Secondary hyperparathyroidism of renal origin: Secondary | ICD-10-CM | POA: Diagnosis not present

## 2023-04-26 DIAGNOSIS — Z992 Dependence on renal dialysis: Secondary | ICD-10-CM | POA: Diagnosis not present

## 2023-04-26 DIAGNOSIS — Z23 Encounter for immunization: Secondary | ICD-10-CM | POA: Diagnosis not present

## 2023-04-26 DIAGNOSIS — N186 End stage renal disease: Secondary | ICD-10-CM | POA: Diagnosis not present

## 2023-04-26 DIAGNOSIS — D689 Coagulation defect, unspecified: Secondary | ICD-10-CM | POA: Diagnosis not present

## 2023-04-26 DIAGNOSIS — R52 Pain, unspecified: Secondary | ICD-10-CM | POA: Diagnosis not present

## 2023-04-26 DIAGNOSIS — D509 Iron deficiency anemia, unspecified: Secondary | ICD-10-CM | POA: Diagnosis not present

## 2023-04-26 DIAGNOSIS — T7840XA Allergy, unspecified, initial encounter: Secondary | ICD-10-CM | POA: Diagnosis not present

## 2023-04-28 DIAGNOSIS — T7840XA Allergy, unspecified, initial encounter: Secondary | ICD-10-CM | POA: Diagnosis not present

## 2023-04-28 DIAGNOSIS — D509 Iron deficiency anemia, unspecified: Secondary | ICD-10-CM | POA: Diagnosis not present

## 2023-04-28 DIAGNOSIS — R52 Pain, unspecified: Secondary | ICD-10-CM | POA: Diagnosis not present

## 2023-04-28 DIAGNOSIS — D689 Coagulation defect, unspecified: Secondary | ICD-10-CM | POA: Diagnosis not present

## 2023-04-28 DIAGNOSIS — D631 Anemia in chronic kidney disease: Secondary | ICD-10-CM | POA: Diagnosis not present

## 2023-04-28 DIAGNOSIS — N2581 Secondary hyperparathyroidism of renal origin: Secondary | ICD-10-CM | POA: Diagnosis not present

## 2023-04-28 DIAGNOSIS — N186 End stage renal disease: Secondary | ICD-10-CM | POA: Diagnosis not present

## 2023-04-28 DIAGNOSIS — Z23 Encounter for immunization: Secondary | ICD-10-CM | POA: Diagnosis not present

## 2023-04-28 DIAGNOSIS — Z992 Dependence on renal dialysis: Secondary | ICD-10-CM | POA: Diagnosis not present

## 2023-05-01 DIAGNOSIS — T7840XA Allergy, unspecified, initial encounter: Secondary | ICD-10-CM | POA: Diagnosis not present

## 2023-05-01 DIAGNOSIS — Z23 Encounter for immunization: Secondary | ICD-10-CM | POA: Diagnosis not present

## 2023-05-01 DIAGNOSIS — D689 Coagulation defect, unspecified: Secondary | ICD-10-CM | POA: Diagnosis not present

## 2023-05-01 DIAGNOSIS — D509 Iron deficiency anemia, unspecified: Secondary | ICD-10-CM | POA: Diagnosis not present

## 2023-05-01 DIAGNOSIS — N2581 Secondary hyperparathyroidism of renal origin: Secondary | ICD-10-CM | POA: Diagnosis not present

## 2023-05-01 DIAGNOSIS — R52 Pain, unspecified: Secondary | ICD-10-CM | POA: Diagnosis not present

## 2023-05-01 DIAGNOSIS — N186 End stage renal disease: Secondary | ICD-10-CM | POA: Diagnosis not present

## 2023-05-01 DIAGNOSIS — D631 Anemia in chronic kidney disease: Secondary | ICD-10-CM | POA: Diagnosis not present

## 2023-05-01 DIAGNOSIS — Z992 Dependence on renal dialysis: Secondary | ICD-10-CM | POA: Diagnosis not present

## 2023-05-03 DIAGNOSIS — D689 Coagulation defect, unspecified: Secondary | ICD-10-CM | POA: Diagnosis not present

## 2023-05-03 DIAGNOSIS — T7840XA Allergy, unspecified, initial encounter: Secondary | ICD-10-CM | POA: Diagnosis not present

## 2023-05-03 DIAGNOSIS — Z23 Encounter for immunization: Secondary | ICD-10-CM | POA: Diagnosis not present

## 2023-05-03 DIAGNOSIS — D509 Iron deficiency anemia, unspecified: Secondary | ICD-10-CM | POA: Diagnosis not present

## 2023-05-03 DIAGNOSIS — N186 End stage renal disease: Secondary | ICD-10-CM | POA: Diagnosis not present

## 2023-05-03 DIAGNOSIS — Z992 Dependence on renal dialysis: Secondary | ICD-10-CM | POA: Diagnosis not present

## 2023-05-03 DIAGNOSIS — N2581 Secondary hyperparathyroidism of renal origin: Secondary | ICD-10-CM | POA: Diagnosis not present

## 2023-05-03 DIAGNOSIS — D631 Anemia in chronic kidney disease: Secondary | ICD-10-CM | POA: Diagnosis not present

## 2023-05-03 DIAGNOSIS — R52 Pain, unspecified: Secondary | ICD-10-CM | POA: Diagnosis not present

## 2023-05-04 DIAGNOSIS — N186 End stage renal disease: Secondary | ICD-10-CM | POA: Diagnosis not present

## 2023-05-04 DIAGNOSIS — N130 Hydronephrosis with ureteropelvic junction obstruction: Secondary | ICD-10-CM | POA: Diagnosis not present

## 2023-05-04 DIAGNOSIS — D631 Anemia in chronic kidney disease: Secondary | ICD-10-CM | POA: Diagnosis not present

## 2023-05-04 DIAGNOSIS — N2581 Secondary hyperparathyroidism of renal origin: Secondary | ICD-10-CM | POA: Diagnosis not present

## 2023-05-04 DIAGNOSIS — E8770 Fluid overload, unspecified: Secondary | ICD-10-CM | POA: Diagnosis not present

## 2023-05-04 DIAGNOSIS — D509 Iron deficiency anemia, unspecified: Secondary | ICD-10-CM | POA: Diagnosis not present

## 2023-05-04 DIAGNOSIS — Z992 Dependence on renal dialysis: Secondary | ICD-10-CM | POA: Diagnosis not present

## 2023-05-04 DIAGNOSIS — D689 Coagulation defect, unspecified: Secondary | ICD-10-CM | POA: Diagnosis not present

## 2023-05-04 DIAGNOSIS — T7840XA Allergy, unspecified, initial encounter: Secondary | ICD-10-CM | POA: Diagnosis not present

## 2023-05-04 DIAGNOSIS — R52 Pain, unspecified: Secondary | ICD-10-CM | POA: Diagnosis not present

## 2023-05-04 DIAGNOSIS — Z23 Encounter for immunization: Secondary | ICD-10-CM | POA: Diagnosis not present

## 2023-05-04 DIAGNOSIS — N138 Other obstructive and reflux uropathy: Secondary | ICD-10-CM | POA: Diagnosis not present

## 2023-05-04 DIAGNOSIS — C7951 Secondary malignant neoplasm of bone: Secondary | ICD-10-CM | POA: Diagnosis not present

## 2023-05-05 DIAGNOSIS — R197 Diarrhea, unspecified: Secondary | ICD-10-CM | POA: Diagnosis not present

## 2023-05-05 DIAGNOSIS — T7840XA Allergy, unspecified, initial encounter: Secondary | ICD-10-CM | POA: Diagnosis not present

## 2023-05-05 DIAGNOSIS — D689 Coagulation defect, unspecified: Secondary | ICD-10-CM | POA: Diagnosis not present

## 2023-05-05 DIAGNOSIS — N186 End stage renal disease: Secondary | ICD-10-CM | POA: Diagnosis not present

## 2023-05-05 DIAGNOSIS — D631 Anemia in chronic kidney disease: Secondary | ICD-10-CM | POA: Diagnosis not present

## 2023-05-05 DIAGNOSIS — N2581 Secondary hyperparathyroidism of renal origin: Secondary | ICD-10-CM | POA: Diagnosis not present

## 2023-05-05 DIAGNOSIS — Z992 Dependence on renal dialysis: Secondary | ICD-10-CM | POA: Diagnosis not present

## 2023-05-05 DIAGNOSIS — R52 Pain, unspecified: Secondary | ICD-10-CM | POA: Diagnosis not present

## 2023-05-05 DIAGNOSIS — D509 Iron deficiency anemia, unspecified: Secondary | ICD-10-CM | POA: Diagnosis not present

## 2023-05-08 ENCOUNTER — Other Ambulatory Visit: Payer: Self-pay | Admitting: Family Medicine

## 2023-05-08 DIAGNOSIS — R52 Pain, unspecified: Secondary | ICD-10-CM | POA: Diagnosis not present

## 2023-05-08 DIAGNOSIS — D509 Iron deficiency anemia, unspecified: Secondary | ICD-10-CM | POA: Diagnosis not present

## 2023-05-08 DIAGNOSIS — D631 Anemia in chronic kidney disease: Secondary | ICD-10-CM | POA: Diagnosis not present

## 2023-05-08 DIAGNOSIS — Z992 Dependence on renal dialysis: Secondary | ICD-10-CM | POA: Diagnosis not present

## 2023-05-08 DIAGNOSIS — N2581 Secondary hyperparathyroidism of renal origin: Secondary | ICD-10-CM | POA: Diagnosis not present

## 2023-05-08 DIAGNOSIS — F419 Anxiety disorder, unspecified: Secondary | ICD-10-CM

## 2023-05-08 DIAGNOSIS — N186 End stage renal disease: Secondary | ICD-10-CM | POA: Diagnosis not present

## 2023-05-08 DIAGNOSIS — T7840XA Allergy, unspecified, initial encounter: Secondary | ICD-10-CM | POA: Diagnosis not present

## 2023-05-08 DIAGNOSIS — R197 Diarrhea, unspecified: Secondary | ICD-10-CM | POA: Diagnosis not present

## 2023-05-08 DIAGNOSIS — D689 Coagulation defect, unspecified: Secondary | ICD-10-CM | POA: Diagnosis not present

## 2023-05-09 MED ORDER — ALPRAZOLAM 0.25 MG PO TABS: 0.2500 mg | ORAL_TABLET | Freq: Every day | ORAL | 0 refills | Status: DC | PRN

## 2023-05-09 NOTE — Telephone Encounter (Signed)
Is this okay to refill? 

## 2023-05-10 DIAGNOSIS — R52 Pain, unspecified: Secondary | ICD-10-CM | POA: Diagnosis not present

## 2023-05-10 DIAGNOSIS — T7840XA Allergy, unspecified, initial encounter: Secondary | ICD-10-CM | POA: Diagnosis not present

## 2023-05-10 DIAGNOSIS — N186 End stage renal disease: Secondary | ICD-10-CM | POA: Diagnosis not present

## 2023-05-10 DIAGNOSIS — D689 Coagulation defect, unspecified: Secondary | ICD-10-CM | POA: Diagnosis not present

## 2023-05-10 DIAGNOSIS — N2581 Secondary hyperparathyroidism of renal origin: Secondary | ICD-10-CM | POA: Diagnosis not present

## 2023-05-10 DIAGNOSIS — R197 Diarrhea, unspecified: Secondary | ICD-10-CM | POA: Diagnosis not present

## 2023-05-10 DIAGNOSIS — D509 Iron deficiency anemia, unspecified: Secondary | ICD-10-CM | POA: Diagnosis not present

## 2023-05-10 DIAGNOSIS — Z992 Dependence on renal dialysis: Secondary | ICD-10-CM | POA: Diagnosis not present

## 2023-05-10 DIAGNOSIS — D631 Anemia in chronic kidney disease: Secondary | ICD-10-CM | POA: Diagnosis not present

## 2023-05-12 DIAGNOSIS — T7840XA Allergy, unspecified, initial encounter: Secondary | ICD-10-CM | POA: Diagnosis not present

## 2023-05-12 DIAGNOSIS — R52 Pain, unspecified: Secondary | ICD-10-CM | POA: Diagnosis not present

## 2023-05-12 DIAGNOSIS — N186 End stage renal disease: Secondary | ICD-10-CM | POA: Diagnosis not present

## 2023-05-12 DIAGNOSIS — R197 Diarrhea, unspecified: Secondary | ICD-10-CM | POA: Diagnosis not present

## 2023-05-12 DIAGNOSIS — Z992 Dependence on renal dialysis: Secondary | ICD-10-CM | POA: Diagnosis not present

## 2023-05-12 DIAGNOSIS — D631 Anemia in chronic kidney disease: Secondary | ICD-10-CM | POA: Diagnosis not present

## 2023-05-12 DIAGNOSIS — D509 Iron deficiency anemia, unspecified: Secondary | ICD-10-CM | POA: Diagnosis not present

## 2023-05-12 DIAGNOSIS — D689 Coagulation defect, unspecified: Secondary | ICD-10-CM | POA: Diagnosis not present

## 2023-05-12 DIAGNOSIS — N2581 Secondary hyperparathyroidism of renal origin: Secondary | ICD-10-CM | POA: Diagnosis not present

## 2023-05-15 ENCOUNTER — Other Ambulatory Visit: Payer: Self-pay | Admitting: Urology

## 2023-05-15 DIAGNOSIS — D631 Anemia in chronic kidney disease: Secondary | ICD-10-CM | POA: Diagnosis not present

## 2023-05-15 DIAGNOSIS — N186 End stage renal disease: Secondary | ICD-10-CM | POA: Diagnosis not present

## 2023-05-15 DIAGNOSIS — R52 Pain, unspecified: Secondary | ICD-10-CM | POA: Diagnosis not present

## 2023-05-15 DIAGNOSIS — Z992 Dependence on renal dialysis: Secondary | ICD-10-CM | POA: Diagnosis not present

## 2023-05-15 DIAGNOSIS — T7840XA Allergy, unspecified, initial encounter: Secondary | ICD-10-CM | POA: Diagnosis not present

## 2023-05-15 DIAGNOSIS — R197 Diarrhea, unspecified: Secondary | ICD-10-CM | POA: Diagnosis not present

## 2023-05-15 DIAGNOSIS — N2581 Secondary hyperparathyroidism of renal origin: Secondary | ICD-10-CM | POA: Diagnosis not present

## 2023-05-15 DIAGNOSIS — D689 Coagulation defect, unspecified: Secondary | ICD-10-CM | POA: Diagnosis not present

## 2023-05-15 DIAGNOSIS — D509 Iron deficiency anemia, unspecified: Secondary | ICD-10-CM | POA: Diagnosis not present

## 2023-05-17 DIAGNOSIS — D689 Coagulation defect, unspecified: Secondary | ICD-10-CM | POA: Diagnosis not present

## 2023-05-17 DIAGNOSIS — R52 Pain, unspecified: Secondary | ICD-10-CM | POA: Diagnosis not present

## 2023-05-17 DIAGNOSIS — T7840XA Allergy, unspecified, initial encounter: Secondary | ICD-10-CM | POA: Diagnosis not present

## 2023-05-17 DIAGNOSIS — N186 End stage renal disease: Secondary | ICD-10-CM | POA: Diagnosis not present

## 2023-05-17 DIAGNOSIS — N2581 Secondary hyperparathyroidism of renal origin: Secondary | ICD-10-CM | POA: Diagnosis not present

## 2023-05-17 DIAGNOSIS — D631 Anemia in chronic kidney disease: Secondary | ICD-10-CM | POA: Diagnosis not present

## 2023-05-17 DIAGNOSIS — Z992 Dependence on renal dialysis: Secondary | ICD-10-CM | POA: Diagnosis not present

## 2023-05-17 DIAGNOSIS — R197 Diarrhea, unspecified: Secondary | ICD-10-CM | POA: Diagnosis not present

## 2023-05-17 DIAGNOSIS — D509 Iron deficiency anemia, unspecified: Secondary | ICD-10-CM | POA: Diagnosis not present

## 2023-05-19 DIAGNOSIS — N186 End stage renal disease: Secondary | ICD-10-CM | POA: Diagnosis not present

## 2023-05-19 DIAGNOSIS — D689 Coagulation defect, unspecified: Secondary | ICD-10-CM | POA: Diagnosis not present

## 2023-05-19 DIAGNOSIS — T7840XA Allergy, unspecified, initial encounter: Secondary | ICD-10-CM | POA: Diagnosis not present

## 2023-05-19 DIAGNOSIS — R197 Diarrhea, unspecified: Secondary | ICD-10-CM | POA: Diagnosis not present

## 2023-05-19 DIAGNOSIS — D509 Iron deficiency anemia, unspecified: Secondary | ICD-10-CM | POA: Diagnosis not present

## 2023-05-19 DIAGNOSIS — Z992 Dependence on renal dialysis: Secondary | ICD-10-CM | POA: Diagnosis not present

## 2023-05-19 DIAGNOSIS — R52 Pain, unspecified: Secondary | ICD-10-CM | POA: Diagnosis not present

## 2023-05-19 DIAGNOSIS — D631 Anemia in chronic kidney disease: Secondary | ICD-10-CM | POA: Diagnosis not present

## 2023-05-19 DIAGNOSIS — N2581 Secondary hyperparathyroidism of renal origin: Secondary | ICD-10-CM | POA: Diagnosis not present

## 2023-05-22 DIAGNOSIS — D509 Iron deficiency anemia, unspecified: Secondary | ICD-10-CM | POA: Diagnosis not present

## 2023-05-22 DIAGNOSIS — N186 End stage renal disease: Secondary | ICD-10-CM | POA: Diagnosis not present

## 2023-05-22 DIAGNOSIS — D631 Anemia in chronic kidney disease: Secondary | ICD-10-CM | POA: Diagnosis not present

## 2023-05-22 DIAGNOSIS — T7840XA Allergy, unspecified, initial encounter: Secondary | ICD-10-CM | POA: Diagnosis not present

## 2023-05-22 DIAGNOSIS — R197 Diarrhea, unspecified: Secondary | ICD-10-CM | POA: Diagnosis not present

## 2023-05-22 DIAGNOSIS — D689 Coagulation defect, unspecified: Secondary | ICD-10-CM | POA: Diagnosis not present

## 2023-05-22 DIAGNOSIS — Z992 Dependence on renal dialysis: Secondary | ICD-10-CM | POA: Diagnosis not present

## 2023-05-22 DIAGNOSIS — R52 Pain, unspecified: Secondary | ICD-10-CM | POA: Diagnosis not present

## 2023-05-22 DIAGNOSIS — N2581 Secondary hyperparathyroidism of renal origin: Secondary | ICD-10-CM | POA: Diagnosis not present

## 2023-05-24 DIAGNOSIS — N2581 Secondary hyperparathyroidism of renal origin: Secondary | ICD-10-CM | POA: Diagnosis not present

## 2023-05-24 DIAGNOSIS — Z992 Dependence on renal dialysis: Secondary | ICD-10-CM | POA: Diagnosis not present

## 2023-05-24 DIAGNOSIS — D689 Coagulation defect, unspecified: Secondary | ICD-10-CM | POA: Diagnosis not present

## 2023-05-24 DIAGNOSIS — R52 Pain, unspecified: Secondary | ICD-10-CM | POA: Diagnosis not present

## 2023-05-24 DIAGNOSIS — T7840XA Allergy, unspecified, initial encounter: Secondary | ICD-10-CM | POA: Diagnosis not present

## 2023-05-24 DIAGNOSIS — N186 End stage renal disease: Secondary | ICD-10-CM | POA: Diagnosis not present

## 2023-05-24 DIAGNOSIS — D509 Iron deficiency anemia, unspecified: Secondary | ICD-10-CM | POA: Diagnosis not present

## 2023-05-24 DIAGNOSIS — R197 Diarrhea, unspecified: Secondary | ICD-10-CM | POA: Diagnosis not present

## 2023-05-24 DIAGNOSIS — D631 Anemia in chronic kidney disease: Secondary | ICD-10-CM | POA: Diagnosis not present

## 2023-05-26 DIAGNOSIS — Z992 Dependence on renal dialysis: Secondary | ICD-10-CM | POA: Diagnosis not present

## 2023-05-26 DIAGNOSIS — D631 Anemia in chronic kidney disease: Secondary | ICD-10-CM | POA: Diagnosis not present

## 2023-05-26 DIAGNOSIS — N2581 Secondary hyperparathyroidism of renal origin: Secondary | ICD-10-CM | POA: Diagnosis not present

## 2023-05-26 DIAGNOSIS — T7840XA Allergy, unspecified, initial encounter: Secondary | ICD-10-CM | POA: Diagnosis not present

## 2023-05-26 DIAGNOSIS — D689 Coagulation defect, unspecified: Secondary | ICD-10-CM | POA: Diagnosis not present

## 2023-05-26 DIAGNOSIS — R197 Diarrhea, unspecified: Secondary | ICD-10-CM | POA: Diagnosis not present

## 2023-05-26 DIAGNOSIS — D509 Iron deficiency anemia, unspecified: Secondary | ICD-10-CM | POA: Diagnosis not present

## 2023-05-26 DIAGNOSIS — R52 Pain, unspecified: Secondary | ICD-10-CM | POA: Diagnosis not present

## 2023-05-26 DIAGNOSIS — N186 End stage renal disease: Secondary | ICD-10-CM | POA: Diagnosis not present

## 2023-05-28 DIAGNOSIS — T7840XA Allergy, unspecified, initial encounter: Secondary | ICD-10-CM | POA: Diagnosis not present

## 2023-05-28 DIAGNOSIS — D689 Coagulation defect, unspecified: Secondary | ICD-10-CM | POA: Diagnosis not present

## 2023-05-28 DIAGNOSIS — R52 Pain, unspecified: Secondary | ICD-10-CM | POA: Diagnosis not present

## 2023-05-28 DIAGNOSIS — D509 Iron deficiency anemia, unspecified: Secondary | ICD-10-CM | POA: Diagnosis not present

## 2023-05-28 DIAGNOSIS — N2581 Secondary hyperparathyroidism of renal origin: Secondary | ICD-10-CM | POA: Diagnosis not present

## 2023-05-28 DIAGNOSIS — Z992 Dependence on renal dialysis: Secondary | ICD-10-CM | POA: Diagnosis not present

## 2023-05-28 DIAGNOSIS — R197 Diarrhea, unspecified: Secondary | ICD-10-CM | POA: Diagnosis not present

## 2023-05-28 DIAGNOSIS — N186 End stage renal disease: Secondary | ICD-10-CM | POA: Diagnosis not present

## 2023-05-28 DIAGNOSIS — D631 Anemia in chronic kidney disease: Secondary | ICD-10-CM | POA: Diagnosis not present

## 2023-05-30 DIAGNOSIS — Z992 Dependence on renal dialysis: Secondary | ICD-10-CM | POA: Diagnosis not present

## 2023-05-30 DIAGNOSIS — D631 Anemia in chronic kidney disease: Secondary | ICD-10-CM | POA: Diagnosis not present

## 2023-05-30 DIAGNOSIS — D689 Coagulation defect, unspecified: Secondary | ICD-10-CM | POA: Diagnosis not present

## 2023-05-30 DIAGNOSIS — N186 End stage renal disease: Secondary | ICD-10-CM | POA: Diagnosis not present

## 2023-05-30 DIAGNOSIS — D509 Iron deficiency anemia, unspecified: Secondary | ICD-10-CM | POA: Diagnosis not present

## 2023-05-30 DIAGNOSIS — R197 Diarrhea, unspecified: Secondary | ICD-10-CM | POA: Diagnosis not present

## 2023-05-30 DIAGNOSIS — R52 Pain, unspecified: Secondary | ICD-10-CM | POA: Diagnosis not present

## 2023-05-30 DIAGNOSIS — N2581 Secondary hyperparathyroidism of renal origin: Secondary | ICD-10-CM | POA: Diagnosis not present

## 2023-05-30 DIAGNOSIS — T7840XA Allergy, unspecified, initial encounter: Secondary | ICD-10-CM | POA: Diagnosis not present

## 2023-06-02 DIAGNOSIS — Z992 Dependence on renal dialysis: Secondary | ICD-10-CM | POA: Diagnosis not present

## 2023-06-02 DIAGNOSIS — R52 Pain, unspecified: Secondary | ICD-10-CM | POA: Diagnosis not present

## 2023-06-02 DIAGNOSIS — D631 Anemia in chronic kidney disease: Secondary | ICD-10-CM | POA: Diagnosis not present

## 2023-06-02 DIAGNOSIS — D689 Coagulation defect, unspecified: Secondary | ICD-10-CM | POA: Diagnosis not present

## 2023-06-02 DIAGNOSIS — D509 Iron deficiency anemia, unspecified: Secondary | ICD-10-CM | POA: Diagnosis not present

## 2023-06-02 DIAGNOSIS — T7840XA Allergy, unspecified, initial encounter: Secondary | ICD-10-CM | POA: Diagnosis not present

## 2023-06-02 DIAGNOSIS — N2581 Secondary hyperparathyroidism of renal origin: Secondary | ICD-10-CM | POA: Diagnosis not present

## 2023-06-02 DIAGNOSIS — R197 Diarrhea, unspecified: Secondary | ICD-10-CM | POA: Diagnosis not present

## 2023-06-02 DIAGNOSIS — N186 End stage renal disease: Secondary | ICD-10-CM | POA: Diagnosis not present

## 2023-06-03 DIAGNOSIS — N186 End stage renal disease: Secondary | ICD-10-CM | POA: Diagnosis not present

## 2023-06-03 DIAGNOSIS — N138 Other obstructive and reflux uropathy: Secondary | ICD-10-CM | POA: Diagnosis not present

## 2023-06-03 DIAGNOSIS — Z992 Dependence on renal dialysis: Secondary | ICD-10-CM | POA: Diagnosis not present

## 2023-06-05 DIAGNOSIS — R52 Pain, unspecified: Secondary | ICD-10-CM | POA: Diagnosis not present

## 2023-06-05 DIAGNOSIS — R197 Diarrhea, unspecified: Secondary | ICD-10-CM | POA: Diagnosis not present

## 2023-06-05 DIAGNOSIS — D631 Anemia in chronic kidney disease: Secondary | ICD-10-CM | POA: Diagnosis not present

## 2023-06-05 DIAGNOSIS — T7840XA Allergy, unspecified, initial encounter: Secondary | ICD-10-CM | POA: Diagnosis not present

## 2023-06-05 DIAGNOSIS — D689 Coagulation defect, unspecified: Secondary | ICD-10-CM | POA: Diagnosis not present

## 2023-06-05 DIAGNOSIS — Z992 Dependence on renal dialysis: Secondary | ICD-10-CM | POA: Diagnosis not present

## 2023-06-05 DIAGNOSIS — D509 Iron deficiency anemia, unspecified: Secondary | ICD-10-CM | POA: Diagnosis not present

## 2023-06-05 DIAGNOSIS — N2581 Secondary hyperparathyroidism of renal origin: Secondary | ICD-10-CM | POA: Diagnosis not present

## 2023-06-05 DIAGNOSIS — N186 End stage renal disease: Secondary | ICD-10-CM | POA: Diagnosis not present

## 2023-06-07 DIAGNOSIS — D631 Anemia in chronic kidney disease: Secondary | ICD-10-CM | POA: Diagnosis not present

## 2023-06-07 DIAGNOSIS — R52 Pain, unspecified: Secondary | ICD-10-CM | POA: Diagnosis not present

## 2023-06-07 DIAGNOSIS — N2581 Secondary hyperparathyroidism of renal origin: Secondary | ICD-10-CM | POA: Diagnosis not present

## 2023-06-07 DIAGNOSIS — N186 End stage renal disease: Secondary | ICD-10-CM | POA: Diagnosis not present

## 2023-06-07 DIAGNOSIS — R197 Diarrhea, unspecified: Secondary | ICD-10-CM | POA: Diagnosis not present

## 2023-06-07 DIAGNOSIS — D689 Coagulation defect, unspecified: Secondary | ICD-10-CM | POA: Diagnosis not present

## 2023-06-07 DIAGNOSIS — Z992 Dependence on renal dialysis: Secondary | ICD-10-CM | POA: Diagnosis not present

## 2023-06-07 DIAGNOSIS — D509 Iron deficiency anemia, unspecified: Secondary | ICD-10-CM | POA: Diagnosis not present

## 2023-06-07 DIAGNOSIS — T7840XA Allergy, unspecified, initial encounter: Secondary | ICD-10-CM | POA: Diagnosis not present

## 2023-06-09 DIAGNOSIS — R52 Pain, unspecified: Secondary | ICD-10-CM | POA: Diagnosis not present

## 2023-06-09 DIAGNOSIS — T7840XA Allergy, unspecified, initial encounter: Secondary | ICD-10-CM | POA: Diagnosis not present

## 2023-06-09 DIAGNOSIS — Z992 Dependence on renal dialysis: Secondary | ICD-10-CM | POA: Diagnosis not present

## 2023-06-09 DIAGNOSIS — N186 End stage renal disease: Secondary | ICD-10-CM | POA: Diagnosis not present

## 2023-06-09 DIAGNOSIS — N2581 Secondary hyperparathyroidism of renal origin: Secondary | ICD-10-CM | POA: Diagnosis not present

## 2023-06-09 DIAGNOSIS — D689 Coagulation defect, unspecified: Secondary | ICD-10-CM | POA: Diagnosis not present

## 2023-06-09 DIAGNOSIS — D509 Iron deficiency anemia, unspecified: Secondary | ICD-10-CM | POA: Diagnosis not present

## 2023-06-09 DIAGNOSIS — D631 Anemia in chronic kidney disease: Secondary | ICD-10-CM | POA: Diagnosis not present

## 2023-06-09 DIAGNOSIS — R197 Diarrhea, unspecified: Secondary | ICD-10-CM | POA: Diagnosis not present

## 2023-06-12 DIAGNOSIS — D689 Coagulation defect, unspecified: Secondary | ICD-10-CM | POA: Diagnosis not present

## 2023-06-12 DIAGNOSIS — R52 Pain, unspecified: Secondary | ICD-10-CM | POA: Diagnosis not present

## 2023-06-12 DIAGNOSIS — T7840XA Allergy, unspecified, initial encounter: Secondary | ICD-10-CM | POA: Diagnosis not present

## 2023-06-12 DIAGNOSIS — N2581 Secondary hyperparathyroidism of renal origin: Secondary | ICD-10-CM | POA: Diagnosis not present

## 2023-06-12 DIAGNOSIS — D631 Anemia in chronic kidney disease: Secondary | ICD-10-CM | POA: Diagnosis not present

## 2023-06-12 DIAGNOSIS — N186 End stage renal disease: Secondary | ICD-10-CM | POA: Diagnosis not present

## 2023-06-12 DIAGNOSIS — R197 Diarrhea, unspecified: Secondary | ICD-10-CM | POA: Diagnosis not present

## 2023-06-12 DIAGNOSIS — Z992 Dependence on renal dialysis: Secondary | ICD-10-CM | POA: Diagnosis not present

## 2023-06-12 DIAGNOSIS — D509 Iron deficiency anemia, unspecified: Secondary | ICD-10-CM | POA: Diagnosis not present

## 2023-06-13 NOTE — Patient Instructions (Signed)
SURGICAL WAITING ROOM VISITATION  Patients having surgery or a procedure may have no more than 2 support people in the waiting area - these visitors may rotate.    Children under the age of 32 must have an adult with them who is not the patient.  Due to an increase in RSV and influenza rates and associated hospitalizations, children ages 36 and under may not visit patients in [facility] hospitals.  If the patient needs to stay at the hospital during part of their recovery, the visitor guidelines for inpatient rooms apply. Pre-op nurse will coordinate an appropriate time for 1 support person to accompany patient in pre-op.  This support person may not rotate.    Please refer to the [facility] website for the visitor guidelines for Inpatients (after your surgery is over and you are in a regular room).       Your procedure is scheduled on:  06/23/2023    Report to [facility] Main Entrance    Report to admitting at  0900AM   Call this number if you have problems the morning of surgery [phone]   Do not eat food or drink liquids  :After Midnight.                             If you have questions, please contact your surgeon's office.      Oral Hygiene is also important to reduce your risk of infection.                                    Remember - BRUSH YOUR TEETH THE MORNING OF SURGERY WITH YOUR REGULAR TOOTHPASTE  DENTURES WILL BE REMOVED PRIOR TO SURGERY PLEASE DO NOT APPLY "Poly grip" OR ADHESIVES!!!   Do NOT smoke after Midnight   Stop all vitamins and herbal supplements 7 days before surgery.   Take these medicines the morning of surgery with A SIP OF WATER:  allopurinol, clonidine if needed, hydralazine, protonix   DO NOT TAKE ANY ORAL DIABETIC MEDICATIONS DAY OF YOUR SURGERY  Bring CPAP mask and tubing day of surgery.                              You may not have any metal on your body including hair pins, jewelry, and body piercing             Do  not wear make-up, lotions, powders, perfumes/cologne, or deodorant  Do not wear nail polish including gel and S&S, artificial/acrylic nails, or any other type of covering on natural nails including finger and toenails. If you have artificial nails, gel coating, etc. that needs to be removed by a nail salon please have this removed prior to surgery or surgery may need to be canceled/ delayed if the surgeon/ anesthesia feels like they are unable to be safely monitored.   Do not shave  48 hours prior to surgery.               Men may shave face and neck.   Do not bring valuables to the hospital. [place] IS NOT             RESPONSIBLE   FOR VALUABLES.   Contacts, glasses, dentures or bridgework may not be worn into surgery.   Bring small overnight bag  day of surgery.   DO NOT BRING YOUR HOME MEDICATIONS TO THE HOSPITAL. PHARMACY WILL DISPENSE MEDICATIONS LISTED ON YOUR MEDICATION LIST TO YOU DURING YOUR ADMISSION IN THE HOSPITAL!    Patients discharged on the day of surgery will not be allowed to drive home.  Someone NEEDS to stay with you for the first 24 hours after anesthesia.   Special Instructions: Bring a copy of your healthcare power of attorney and living will documents the day of surgery if you haven't scanned them before.              Please read over the following fact sheets you were given: IF YOU HAVE QUESTIONS ABOUT YOUR PRE-OP INSTRUCTIONS PLEASE CALL [phone]   If you received a COVID test during your pre-op visit  it is requested that you wear a mask when out in public, stay away from anyone that may not be feeling well and notify your surgeon if you develop symptoms. If you test positive for Covid or have been in contact with anyone that has tested positive in the last 10 days please notify you surgeon.    [place] - Preparing for Surgery Before surgery, you can play an important role.  Because skin is not sterile, your skin needs to be as free of germs as  possible.  You can reduce the number of germs on your skin by washing with CHG (chlorahexidine gluconate) soap before surgery.  CHG is an antiseptic cleaner which kills germs and bonds with the skin to continue killing germs even after washing. Please DO NOT use if you have an allergy to CHG or antibacterial soaps.  If your skin becomes reddened/irritated stop using the CHG and inform your nurse when you arrive at Short Stay. Do not shave (including legs and underarms) for at least 48 hours prior to the first CHG shower.  You may shave your face/neck. Please follow these instructions carefully:  1.  Shower with CHG Soap the night before surgery and the  morning of Surgery.  2.  If you choose to wash your hair, wash your hair first as usual with your  normal  shampoo.  3.  After you shampoo, rinse your hair and body thoroughly to remove the  shampoo.                           4.  Use CHG as you would any other liquid soap.  You can apply chg directly  to the skin and wash                       Gently with a scrungie or clean washcloth.  5.  Apply the CHG Soap to your body ONLY FROM THE NECK DOWN.   Do not use on face/ open                           Wound or open sores. Avoid contact with eyes, ears mouth and genitals (private parts).                       Wash face,  Genitals (private parts) with your normal soap.             6.  Wash thoroughly, paying special attention to the area where your surgery  will be performed.  7.  Thoroughly rinse your body with warm water from  the neck down.  8.  DO NOT shower/wash with your normal soap after using and rinsing off  the CHG Soap.                9.  Pat yourself dry with a clean towel.            10.  Wear clean pajamas.            11.  Place clean sheets on your bed the night of your first shower and do not  sleep with pets. Day of Surgery : Do not apply any lotions/deodorants the morning of surgery.  Please wear clean clothes to the hospital/surgery  center.  FAILURE TO FOLLOW THESE INSTRUCTIONS MAY RESULT IN THE CANCELLATION OF YOUR SURGERY PATIENT SIGNATURE_________________________________  NURSE SIGNATURE__________________________________  ________________________________________________________________________

## 2023-06-13 NOTE — Progress Notes (Signed)
Anesthesia Review:  PCP: Charlynne Cousins 01/19/23 LOV  Cardiologist : Lebron Conners LOV 02/13/23  Chest x-ray : 01/13/23- 2 view  EKG : 01/16/23  Echo : 2021  Stress test: Cardiac Cath :  Activity level:  Sleep Study/ CPAP : Fasting Blood Sugar :      / Checks Blood Sugar -- times a day:   Blood Thinner/ Instructions /Last Dose: ASA / Instructions/ Last Dose :    81 mg aspirin   Dialysis pt - M-W-F

## 2023-06-14 DIAGNOSIS — Z992 Dependence on renal dialysis: Secondary | ICD-10-CM | POA: Diagnosis not present

## 2023-06-14 DIAGNOSIS — R52 Pain, unspecified: Secondary | ICD-10-CM | POA: Diagnosis not present

## 2023-06-14 DIAGNOSIS — T7840XA Allergy, unspecified, initial encounter: Secondary | ICD-10-CM | POA: Diagnosis not present

## 2023-06-14 DIAGNOSIS — N186 End stage renal disease: Secondary | ICD-10-CM | POA: Diagnosis not present

## 2023-06-14 DIAGNOSIS — D631 Anemia in chronic kidney disease: Secondary | ICD-10-CM | POA: Diagnosis not present

## 2023-06-14 DIAGNOSIS — D689 Coagulation defect, unspecified: Secondary | ICD-10-CM | POA: Diagnosis not present

## 2023-06-14 DIAGNOSIS — N2581 Secondary hyperparathyroidism of renal origin: Secondary | ICD-10-CM | POA: Diagnosis not present

## 2023-06-14 DIAGNOSIS — D509 Iron deficiency anemia, unspecified: Secondary | ICD-10-CM | POA: Diagnosis not present

## 2023-06-14 DIAGNOSIS — R197 Diarrhea, unspecified: Secondary | ICD-10-CM | POA: Diagnosis not present

## 2023-06-15 ENCOUNTER — Other Ambulatory Visit: Payer: Self-pay

## 2023-06-15 ENCOUNTER — Encounter (HOSPITAL_COMMUNITY)
Admission: RE | Admit: 2023-06-15 | Discharge: 2023-06-15 | Disposition: A | Discharge status: Home / Self Care | Payer: Medicare Other | Source: Ambulatory Visit | Attending: Urology | Admitting: Urology

## 2023-06-15 ENCOUNTER — Encounter (HOSPITAL_COMMUNITY): Payer: Self-pay

## 2023-06-15 ENCOUNTER — Other Ambulatory Visit: Payer: Self-pay | Admitting: Family Medicine

## 2023-06-15 VITALS — Ht 74.0 in | Wt 175.0 lb

## 2023-06-15 DIAGNOSIS — Z01818 Encounter for other preprocedural examination: Secondary | ICD-10-CM

## 2023-06-15 DIAGNOSIS — F419 Anxiety disorder, unspecified: Secondary | ICD-10-CM

## 2023-06-15 HISTORY — DX: Anxiety disorder, unspecified: F41.9

## 2023-06-15 HISTORY — DX: Depression, unspecified: F32.A

## 2023-06-15 HISTORY — DX: Unspecified osteoarthritis, unspecified site: M19.90

## 2023-06-16 ENCOUNTER — Other Ambulatory Visit: Payer: Self-pay | Admitting: Family Medicine

## 2023-06-16 ENCOUNTER — Telehealth: Payer: Self-pay | Admitting: Nurse Practitioner

## 2023-06-16 ENCOUNTER — Telehealth: Payer: Self-pay

## 2023-06-16 DIAGNOSIS — R197 Diarrhea, unspecified: Secondary | ICD-10-CM | POA: Diagnosis not present

## 2023-06-16 DIAGNOSIS — J4520 Mild intermittent asthma, uncomplicated: Secondary | ICD-10-CM

## 2023-06-16 DIAGNOSIS — N186 End stage renal disease: Secondary | ICD-10-CM | POA: Diagnosis not present

## 2023-06-16 DIAGNOSIS — N2581 Secondary hyperparathyroidism of renal origin: Secondary | ICD-10-CM | POA: Diagnosis not present

## 2023-06-16 DIAGNOSIS — T7840XA Allergy, unspecified, initial encounter: Secondary | ICD-10-CM | POA: Diagnosis not present

## 2023-06-16 DIAGNOSIS — D631 Anemia in chronic kidney disease: Secondary | ICD-10-CM | POA: Diagnosis not present

## 2023-06-16 DIAGNOSIS — D509 Iron deficiency anemia, unspecified: Secondary | ICD-10-CM | POA: Diagnosis not present

## 2023-06-16 DIAGNOSIS — R52 Pain, unspecified: Secondary | ICD-10-CM | POA: Diagnosis not present

## 2023-06-16 DIAGNOSIS — Z992 Dependence on renal dialysis: Secondary | ICD-10-CM | POA: Diagnosis not present

## 2023-06-16 DIAGNOSIS — D689 Coagulation defect, unspecified: Secondary | ICD-10-CM | POA: Diagnosis not present

## 2023-06-16 MED ORDER — ALPRAZOLAM 0.25 MG PO TABS: 0.2500 mg | ORAL_TABLET | Freq: Every day | ORAL | 0 refills | Status: DC | PRN

## 2023-06-16 NOTE — Telephone Encounter (Signed)
Called and spoke to patient scheduled telephone visit on 12/7 patient voiced understanding consent and med rec done... SN     Patient Consent for Virtual Visit        Devon Cook has provided verbal consent on 06/16/2023 for a virtual visit (video or telephone).   CONSENT FOR VIRTUAL VISIT FOR:  Devon Cook  By participating in this virtual visit I agree to the following:  I hereby voluntarily request, consent and authorize [facility] and its employed or contracted physicians, physician assistants, nurse practitioners or other licensed health care professionals (the Practitioner), to provide me with telemedicine health care services (the "Services") as deemed necessary by the treating Practitioner. I acknowledge and consent to receive the Services by the Practitioner via telemedicine. I understand that the telemedicine visit will involve communicating with the Practitioner through live audiovisual communication technology and the disclosure of certain medical information by electronic transmission. I acknowledge that I have been given the opportunity to request an in-person assessment or other available alternative prior to the telemedicine visit and am voluntarily participating in the telemedicine visit.  I understand that I have the right to withhold or withdraw my consent to the use of telemedicine in the course of my care at any time, without affecting my right to future care or treatment, and that the Practitioner or I may terminate the telemedicine visit at any time. I understand that I have the right to inspect all information obtained and/or recorded in the course of the telemedicine visit and may receive copies of available information for a reasonable fee.  I understand that some of the potential risks of receiving the Services via telemedicine include:  Delay or interruption in medical evaluation due to technological equipment failure or disruption; Information  transmitted may not be sufficient (e.g. poor resolution of images) to allow for appropriate medical decision making by the Practitioner; and/or  In rare instances, security protocols could fail, causing a breach of personal health information.  Furthermore, I acknowledge that it is my responsibility to provide information about my medical history, conditions and care that is complete and accurate to the best of my ability. I acknowledge that Practitioner's advice, recommendations, and/or decision may be based on factors not within their control, such as incomplete or inaccurate data provided by me or distortions of diagnostic images or specimens that may result from electronic transmissions. I understand that the practice of medicine is not an exact science and that Practitioner makes no warranties or guarantees regarding treatment outcomes. I acknowledge that a copy of this consent can be made available to me via my patient portal [facility] MyChart), or I can request a printed copy by calling the office of [facility].    I understand that my insurance will be billed for this visit.   I have read or had this consent read to me. I understand the contents of this consent, which adequately explains the benefits and risks of the Services being provided via telemedicine.  I have been provided ample opportunity to ask questions regarding this consent and the Services and have had my questions answered to my satisfaction. I give my informed consent for the services to be provided through the use of telemedicine in my medical care

## 2023-06-16 NOTE — Telephone Encounter (Signed)
Called and spoke to patient scheduled telephone visit on 12/17 patient voiced understanding consent and med rec done... SN

## 2023-06-16 NOTE — Telephone Encounter (Signed)
   Pre-operative Risk Assessment    Patient Name: Devon Cook  DOB: 1961/03/13 MRN: [id number]      Request for Surgical Clearance    Procedure:   Cystoscopy and bilateral stent exchange  Date of Surgery:  Clearance 06/23/23                                 Surgeon:  Dr. Sebastian Ache Surgeon's Group or Practice Name:  [facility] Phone number:  [phone]  Fax number:  [phone]   Type of Clearance Requested:   - Medical  - Pharmacy:  Hold Aspirin     Type of Anesthesia:  General    Additional requests/questions:   Caller Vincente Poli) stated patient will need medical and pharmacy clearance.    Signed, Annetta Maw   06/16/2023, 3:51 PM

## 2023-06-16 NOTE — Telephone Encounter (Signed)
   Name: Devon Cook  DOB: [birth year]  MRN: [id number]  Primary Cardiologist: Meriam Sprague, MD (Inactive)   Preoperative team, please contact this patient and set up a phone call appointment for further preoperative risk assessment. Please obtain consent and complete medication review. Thank you for your help.Last seen 02/13/2023. Echo was planned but not yet completed due to hypertension.  I confirm that guidance regarding antiplatelet and oral anticoagulation therapy has been completed and, if necessary, noted below.  I also confirmed the patient resides in the state of [place]. As per [facility] Medical Board telemedicine laws, the patient must reside in the state in which the provider is licensed.   Joni Reining, NP 06/16/2023, 4:04 PM [facility]

## 2023-06-19 DIAGNOSIS — N2581 Secondary hyperparathyroidism of renal origin: Secondary | ICD-10-CM | POA: Diagnosis not present

## 2023-06-19 DIAGNOSIS — N186 End stage renal disease: Secondary | ICD-10-CM | POA: Diagnosis not present

## 2023-06-19 DIAGNOSIS — T7840XA Allergy, unspecified, initial encounter: Secondary | ICD-10-CM | POA: Diagnosis not present

## 2023-06-19 DIAGNOSIS — D509 Iron deficiency anemia, unspecified: Secondary | ICD-10-CM | POA: Diagnosis not present

## 2023-06-19 DIAGNOSIS — D689 Coagulation defect, unspecified: Secondary | ICD-10-CM | POA: Diagnosis not present

## 2023-06-19 DIAGNOSIS — Z992 Dependence on renal dialysis: Secondary | ICD-10-CM | POA: Diagnosis not present

## 2023-06-19 DIAGNOSIS — R197 Diarrhea, unspecified: Secondary | ICD-10-CM | POA: Diagnosis not present

## 2023-06-19 DIAGNOSIS — R52 Pain, unspecified: Secondary | ICD-10-CM | POA: Diagnosis not present

## 2023-06-19 DIAGNOSIS — D631 Anemia in chronic kidney disease: Secondary | ICD-10-CM | POA: Diagnosis not present

## 2023-06-20 ENCOUNTER — Ambulatory Visit: Payer: Medicare Other | Attending: Cardiology | Admitting: Nurse Practitioner

## 2023-06-20 DIAGNOSIS — Z01810 Encounter for preprocedural cardiovascular examination: Secondary | ICD-10-CM | POA: Diagnosis not present

## 2023-06-20 NOTE — Progress Notes (Signed)
Virtual Visit via Telephone Note   Because of Devon Cook's co-morbid illnesses, he is at least at moderate risk for complications without adequate follow up.  This format is felt to be most appropriate for this patient at this time.  The patient did not have access to video technology/had technical difficulties with video requiring transitioning to audio format only (telephone).  All issues noted in this document were discussed and addressed.  No physical exam could be performed with this format.  Please refer to the patient's chart for his consent to telehealth for [facility].  Evaluation Performed:  Preoperative cardiovascular risk assessment _____________   Date:  06/20/2023   Patient ID:  Devon Cook, DOB 06-25-1961, MRN [id number] Patient Location:  Home Provider location:   Office  Primary Care Provider:  Ronnald Nian, MD Primary Cardiologist:  Meriam Sprague, MD (Inactive)  Chief Complaint / Patient Profile   62 y.o. y/o male with a h/o chest pain, paroxysmal atrial fibrillation, HFmrEF, aortic dilation, hypertension, hyperlipidemia ESRD on HD, OSA, anemia, and prostate cancer who is pending Cystoscopy and bilateral stent exchange on 06/23/2023 with Dr. Sebastian Ache of [facility] and presents today for telephonic preoperative cardiovascular risk assessment.  History of Present Illness    Devon Cook is a 62 y.o. male who presents via audio/video conferencing for a telehealth visit today.  Pt was last seen in cardiology clinic on 02/13/2023 by Eligha Bridegroom, NP. At that time ZHION TRUITT was doing well.  The patient is now pending procedure as outlined above. Since his last visit, he has done well from a cardiac standpoint.  He plays golf and practices yoga and tai chi regularly.  He denies chest pain, palpitations, dyspnea, pnd, orthopnea, n, v, dizziness, syncope, edema, weight gain, or early satiety. All other systems reviewed and  are otherwise negative except as noted above.   Past Medical History    Past Medical History:  Diagnosis Date   Anemia, chronic renal failure    followed by dr Signe Colt--- dx 05/ 2019---  treated with Iron infusions and procrit;   and blood transfusions   Anxiety    Arthritis    Bilateral hydronephrosis urologist-- dr Berneice Heinrich   malignant-- chronic;  treated with ureteral stents   Chronic gastritis    07-17-2019 and duodenitis   Depression    ED (erectile dysfunction)    Edema of both lower extremities    occ le edema wears compresssion hose daily   End stage renal failure on dialysis [facility])    nephrologist-- dr Signe Colt (@ [facility])-- secondary to obstructive uropathy secon  dary to prostate cancer hemodialysis mon/wed/fri @ [facility]---started first week of Nov 2020   GERD (gastroesophageal reflux disease)    Gout 2019   Herpes genitalis in men    History of adenomatous polyp of colon    History of hepatitis C 2017   dx 11/ 207-- treated with Harvoni (in epic 04/ 2019 lab result non-detectable)   History of lower GI bleeding 11/2017   Hypertension    followed by pcp   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 stage renal disease (HCC)    Metastatic cancer to bone (HCC)    secondary to primary prostate cancer  w/ progressive pain   OSA (obstructive sleep apnea)    severe osa w/ nocturnal hyoxemia per study 06-19-2019,  pt given cpap ( however on 08-30-2019 per pt  having issues with mask fitting and on a new one so he not using the cpap);   (03-20-2020 still not using cpap, no mask)   Palpitations    isolated event at ED 10-05-2019 Aflutter/ Afib versus SVT ;   refer to pt's cardiologist note w/ Dr Shari Prows   Pneumonia    Prostate 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 [facility]) urologist-- dr Berneice Heinrich   dx 06/ 2019-- advanced w/ mets to bone and pelvic lymphadeopathy   S/P arteriovenous (AV) fistula creation 03/2019   followed  by dr  Edilia Bo (vascular)  03-15-2019  by dr Chestine Spore, left branhiocephallic;  last intervention balloon angioplasty 09-04-2020   Secondary hyperparathyroidism of renal origin [facility])    Urgency of urination    Wears glasses    Past Surgical History:  Procedure Laterality Date   A/V FISTULAGRAM Left 09/04/2020   Procedure: A/V FISTULAGRAM;  Surgeon: Chuck Hint, MD;  Location: [facility] INVASIVE CV LAB;  Service: Cardiovascular;  Laterality: Left;   AV FISTULA PLACEMENT Left 03/15/2019   Procedure: ARTERIOVENOUS (AV) FISTULA CREATION LEFT ARM;  Surgeon: Cephus Shelling, MD;  [facility];  Service: Vascular;  Laterality: Left;   COLONOSCOPY     CYSTOSCOPY W/ URETERAL STENT PLACEMENT Bilateral 12/08/2017   Procedure: CYSTOSCOPY WITH RETROGRADE PYELOGRAM/URETERAL STENT PLACEMENT;  Surgeon: Sebastian Ache, MD;  Location[facility];  Service: Urology;  Laterality: Bilateral;   CYSTOSCOPY W/ URETERAL STENT PLACEMENT Bilateral 04/20/2018   Procedure: CYSTOSCOPY WITH STENT REPLACEMENT;  Surgeon: Sebastian Ache, MD;  Location: [facility];  Service: Urology;  Laterality: Bilateral;   CYSTOSCOPY W/ URETERAL STENT PLACEMENT Bilateral 08/10/2018   Procedure: CYSTOSCOPY WITH STENT REPLACEMENT, BILATERAL RETROGRADES;  Surgeon: Sebastian Ache, MD;  Location: [facility];  Service: Urology;  Laterality: Bilateral;  45 MINS   CYSTOSCOPY W/ URETERAL STENT PLACEMENT Bilateral 01/18/2019   Procedure: CYSTOSCOPY WITH RETROGRADE PYELOGRAM/URETERAL STENT PLACEMENT;  Surgeon: Sebastian Ache, MD;  Location: [facility];  Service: Urology;  Laterality: Bilateral;   CYSTOSCOPY W/ URETERAL STENT PLACEMENT Bilateral 05/24/2019   Procedure: CYSTOSCOPY WITH RETROGRADE PYELOGRAM/BILATERAL URETERAL STENT EXCHANGE;  Surgeon: Sebastian Ache, MD;  Location: [facility];  Service: Urology;  Laterality: Bilateral;   CYSTOSCOPY W/ URETERAL STENT PLACEMENT Bilateral 09/06/2019    Procedure: CYSTOSCOPY WITH RETROGRADE PYELOGRAM/URETERAL STENT EXCHANGE;  Surgeon: Sebastian Ache, MD;  Location: [facility];  Service: Urology;  Laterality: Bilateral;  45 MINS   CYSTOSCOPY W/ URETERAL STENT PLACEMENT Bilateral 05/08/2020   Procedure: CYSTOSCOPY WITH RETROGRADE PYELOGRAM/URETERAL STENT REPLACEMENT;  Surgeon: Sebastian Ache, MD;  Location: [facility];  Service: Urology;  Laterality: Bilateral;   CYSTOSCOPY W/ URETERAL STENT PLACEMENT Bilateral 06/04/2021   Procedure: CYSTOSCOPY WITH RETROGRADE PYELOGRAM/URETERAL STENT EXCHANGE;  Surgeon: Sebastian Ache, MD;  Location: [facility];  Service: Urology;  Laterality: Bilateral;  45 MINS   CYSTOSCOPY W/ URETERAL STENT PLACEMENT Bilateral 06/17/2022   Procedure: CYSTOSCOPY WITH RETROGRADE PYELOGRAM/URETERAL STENT EXCHANGE;  Surgeon: Sebastian Ache, MD;  Location[facility];  Service: Urology;  Laterality: Bilateral;   INSERTION OF DIALYSIS CATHETER Right 05/06/2019   Procedure: INSERTION OF TUNNELED  DIALYSIS CATHETER;  Surgeon: Larina Earthly, MD;  [facility];  Service: Vascular;  Laterality: Right;   IR FLUORO GUIDE CV LINE RIGHT  11/25/2022   IR NEPHROSTOMY PLACEMENT LEFT  11/15/2017   IR NEPHROSTOMY PLACEMENT RIGHT  11/15/2017   PERIPHERAL VASCULAR INTERVENTION Left 09/04/2020   Procedure: PERIPHERAL VASCULAR INTERVENTION;  Surgeon: Chuck Hint, MD;  Location: [facility] INVASIVE CV LAB;  Service: Cardiovascular;  Laterality: Left;   PROSTATE BIOPSY N/A 12/08/2017   Procedure: BIOPSY TRANSRECTAL ULTRASONIC PROSTATE (TUBP), PERINEAL BIOPSY;  Surgeon: Sebastian Ache, MD;  [facility];  Service: Urology;  Laterality: N/A;   TRANSURETHRAL RESECTION OF BLADDER TUMOR N/A 12/08/2017   Procedure: TRANSURETHRAL RESECTION OF BLADDER TUMOR (TURBT);  Surgeon: Sebastian Ache, MD;  [facility];  Service: Urology;  Laterality: N/A;   TRICEPS TENDON REPAIR Bilateral 08/2005    UPPER GASTROINTESTINAL ENDOSCOPY      Allergies  No Known Allergies  Home Medications    Prior to Admission medications   Medication Sig Start Date End Date Taking? Authorizing Provider  acetaminophen (TYLENOL) 500 MG tablet Take 1,500 mg by mouth every 6 (six) hours as needed for moderate pain.    [provider]  allopurinol (ZYLOPRIM) 100 MG tablet TAKE 1 TABLET BY MOUTH ONCE DAILY 01/22/18   Ronnald Nian, MD  ALPRAZolam Prudy Feeler) 0.25 MG tablet Take 1 tablet (0.25 mg total) by mouth daily as needed. for anxiety 06/16/23   Ronnald Nian, MD  amLODipine (NORVASC) 10 MG tablet TAKE 1 TABLET BY MOUTH EVERYDAY AT BEDTIME 03/07/23   Ronnald Nian, MD  ascorbic acid (VITAMIN C) 500 MG tablet Take 1,000 mg by mouth daily.    [provider]  aspirin EC 81 MG tablet Take 81 mg by mouth daily.    [provider]  cinacalcet (SENSIPAR) 90 MG tablet Take 90 mg by mouth every evening. 06/10/20   [provider]  cloNIDine (CATAPRES) 0.1 MG tablet Take 0.1 mg by mouth 2 (two) times daily as needed (high blood pressure). 01/14/23   [provider]  diphenhydrAMINE (BENADRYL) 25 MG tablet Take 25 mg by mouth every 6 (six) hours as needed for allergies.    [provider]  ferric citrate (AURYXIA) 1 GM 210 MG(Fe) tablet Take 630 mg by mouth 3 (three) times daily with meals.    [provider]  hydrALAZINE (APRESOLINE) 50 MG tablet Take 50 mg by mouth 3 (three) times daily.    [provider]  lidocaine-prilocaine (EMLA) cream Apply 1 Application topically every Monday, Wednesday, and Friday with hemodialysis.    [provider]  losartan (COZAAR) 100 MG tablet Take 100 mg by mouth daily. 03/22/21   [provider]  metoprolol succinate (TOPROL XL) 25 MG 24 hr tablet Take 1 tablet (25 mg total) by mouth daily. Please make overdue appt with Dr. Shari Prows before anymore refills. Thank you 1st attempt Patient taking  differently: Take 25 mg by mouth at bedtime. Please make overdue appt with Dr. Shari Prows before anymore refills. Thank you 1st attempt 04/09/21   Meriam Sprague, MD  Multiple Vitamins-Minerals (MULTIVITAMIN WITH MINERALS) tablet Take 1 tablet by mouth daily.    [provider]  multivitamin (RENA-VIT) TABS tablet Take 1 tablet by mouth daily. 04/23/20   [provider]  OVER THE COUNTER MEDICATION Take 1 capsule by mouth daily. Seamoss    [provider]  pantoprazole (PROTONIX) 40 MG tablet Take 1 tablet (40 mg total) by mouth 2 (two) times daily. 07/17/19   Tressia Danas, MD  VENTOLIN HFA 108 (90 Base) MCG/ACT inhaler INHALE 2 PUFFS BY MOUTH EVERY 4 HOURS AS NEEDED FOR WHEEZE OR FOR SHORTNESS OF BREATH 06/18/23   Ronnald Nian, MD  vitamin B-12 (CYANOCOBALAMIN) 1000 MCG tablet Take 1,000 mcg by mouth daily.  [provider]  XTANDI 40 MG capsule Take 160 mg by mouth at bedtime. 04/14/20   [provider]  zinc gluconate 50 MG tablet Take 50 mg by mouth daily.    [provider]    Physical Exam    Vital Signs:  SHANTE DENTE does not have vital signs available for review today.  Given telephonic nature of communication, physical exam is limited. AAOx3. NAD. Normal affect.  Speech and respirations are unlabored.  Accessory Clinical Findings    None  Assessment & Plan    1.  Preoperative Cardiovascular Risk Assessment:  According to the Revised Cardiac Risk Index (RCRI), his Perioperative Risk of Major Cardiac Event is (%): 6.6. His Functional Capacity in METs is: 8.42 according to the Duke Activity Status Index (DASI). Therefore, based on ACC/AHA guidelines, patient would be at acceptable risk for the planned procedure without further cardiovascular testing.   The patient was advised that if he develops new symptoms prior to surgery to contact our office to arrange for a follow-up visit, and he verbalized  understanding.  Per office protocol, he may hold Aspirin for 5-7 days prior to procedure. Please resume Aspirin as soon as possible postprocedure, at the discretion of the surgeon.   A copy of this note will be routed to requesting surgeon.  Time:   Today, I have spent 5 minutes with the patient with telehealth technology discussing medical history, symptoms, and management plan.     Joylene Grapes, NP  06/20/2023, 3:13 PM

## 2023-06-21 DIAGNOSIS — R197 Diarrhea, unspecified: Secondary | ICD-10-CM | POA: Diagnosis not present

## 2023-06-21 DIAGNOSIS — R52 Pain, unspecified: Secondary | ICD-10-CM | POA: Diagnosis not present

## 2023-06-21 DIAGNOSIS — Z992 Dependence on renal dialysis: Secondary | ICD-10-CM | POA: Diagnosis not present

## 2023-06-21 DIAGNOSIS — T7840XA Allergy, unspecified, initial encounter: Secondary | ICD-10-CM | POA: Diagnosis not present

## 2023-06-21 DIAGNOSIS — D689 Coagulation defect, unspecified: Secondary | ICD-10-CM | POA: Diagnosis not present

## 2023-06-21 DIAGNOSIS — N2581 Secondary hyperparathyroidism of renal origin: Secondary | ICD-10-CM | POA: Diagnosis not present

## 2023-06-21 DIAGNOSIS — N186 End stage renal disease: Secondary | ICD-10-CM | POA: Diagnosis not present

## 2023-06-21 DIAGNOSIS — D631 Anemia in chronic kidney disease: Secondary | ICD-10-CM | POA: Diagnosis not present

## 2023-06-21 DIAGNOSIS — D509 Iron deficiency anemia, unspecified: Secondary | ICD-10-CM | POA: Diagnosis not present

## 2023-06-21 NOTE — Progress Notes (Signed)
Anesthesia Chart Review   Case: [id number] Date/Time: 06/23/23 1045   Procedure: CYSTOSCOPY WITH BILATERAL URETERAL STENT EXCHANGE (Bilateral) - 45 MINUTES   Anesthesia type: General   Pre-op diagnosis: MALIGANT URETERAL OBSTRUCTION   [facility]   Surgeons: Loletta Parish., MD       DISCUSSION:62 y.o. former smoker with h/o HTN, OSA, paroxysmal atrial fibrillation, HFmrEF, aortic dilation, ESRD on Dialysis MWF, prostate cancer, malignant ureteral obstruction scheduled for above procedure 06/23/2023 with Dr. Sebastian Ache.   Pt last seen by cardiology 06/20/2023. Per OV note, "According to the Revised Cardiac Risk Index (RCRI), his Perioperative Risk of Major Cardiac Event is (%): 6.6. His Functional Capacity in METs is: 8.42 according to the Duke Activity Status Index (DASI). Therefore, based on ACC/AHA guidelines, patient would be at acceptable risk for the planned procedure without further cardiovascular testing.    The patient was advised that if he develops new symptoms prior to surgery to contact our office to arrange for a follow-up visit, and he verbalized understanding.   Per office protocol, he may hold Aspirin for 5-7 days prior to procedure. Please resume Aspirin as soon as possible postprocedure, at the discretion of the surgeon."   VS: Ht 6\' 2"  (1.88 m)   Wt 79.4 kg Comment: dry weight per pt  BMI 22.47 kg/m   PROVIDERS: Ronnald Nian, MD is PCP   Cardiologist : Laurance Flatten, MD   LABS:  DOS (all labs ordered are listed, but only abnormal results are displayed)  Labs Reviewed - No data to display   IMAGES:   EKG:   CV: Echo 04/26/2020 1. Left ventricular ejection fraction, by estimation, is 45 to 50%. The  left ventricle has mildly decreased function. The left ventricle  demonstrates global hypokinesis. The left ventricular internal cavity size  was moderately dilated. There is mild  concentric left ventricular  hypertrophy. Left ventricular diastolic  parameters are consistent with Grade I diastolic dysfunction (impaired  relaxation). Elevated left atrial pressure.   2. Right ventricular systolic function is normal. The right ventricular  size is normal. There is moderately elevated pulmonary artery systolic  pressure. The estimated right ventricular systolic pressure is 43.9 mmHg.   3. Left atrial size was moderately dilated.   4. Right atrial size was mildly dilated.   5. The mitral valve is normal in structure. Mild mitral valve  regurgitation. No evidence of mitral stenosis.   6. The aortic valve is normal in structure. Aortic valve regurgitation is  not visualized. No aortic stenosis is present.   7. Aortic dilatation noted. There is mild dilatation of the ascending  aorta, measuring 42 mm.   8. The inferior vena cava is dilated in size with <50% respiratory  variability, suggesting right atrial pressure of 15 mmHg.  Past Medical History:  Diagnosis Date   Anemia, chronic renal failure    followed by dr Signe Colt--- dx 05/ 2019---  treated with Iron infusions and procrit;   and blood transfusions   Anxiety    Arthritis    Bilateral hydronephrosis urologist-- dr Berneice Heinrich   malignant-- chronic;  treated with ureteral stents   Chronic gastritis    07-17-2019 and duodenitis   Depression    ED (erectile dysfunction)    Edema of both lower extremities    occ le edema wears compresssion hose daily   End stage renal failure on dialysis [facility])    nephrologist-- dr Signe Colt (@ [facility])-- secondary  to obstructive uropathy secon  dary to prostate cancer hemodialysis mon/wed/fri @ [facility]---started first week of Nov 2020   GERD (gastroesophageal reflux disease)    Gout 2019   Herpes genitalis in men    History of adenomatous polyp of colon    History of hepatitis C 2017   dx 11/ 207-- treated with Harvoni (in epic 04/ 2019 lab result non-detectable)   History of lower GI  bleeding 11/2017   Hypertension    followed by pcp   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 stage renal disease (HCC)    Metastatic cancer to bone (HCC)    secondary to primary prostate cancer  w/ progressive pain   OSA (obstructive sleep apnea)    severe osa w/ nocturnal hyoxemia per study 06-19-2019,  pt given cpap ( however on 08-30-2019 per pt having issues with mask fitting and on a new one so he not using the cpap);   (03-20-2020 still not using cpap, no mask)   Palpitations    isolated event at ED 10-05-2019 Aflutter/ Afib versus SVT ;   refer to pt's cardiologist note w/ Dr Shari Prows   Pneumonia    Prostate 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 [facility]) urologist-- dr Berneice Heinrich   dx 06/ 2019-- advanced w/ mets to bone and pelvic lymphadeopathy   S/P arteriovenous (AV) fistula creation 03/2019   followed  by dr Edilia Bo (vascular)  03-15-2019  by dr Chestine Spore, left branhiocephallic;  last intervention balloon angioplasty 09-04-2020   Secondary hyperparathyroidism of renal origin [facility])    Urgency of urination    Wears glasses     Past Surgical History:  Procedure Laterality Date   A/V FISTULAGRAM Left 09/04/2020   Procedure: A/V FISTULAGRAM;  Surgeon: Chuck Hint, MD;  Location: [facility] INVASIVE CV LAB;  Service: Cardiovascular;  Laterality: Left;   AV FISTULA PLACEMENT Left 03/15/2019   Procedure: ARTERIOVENOUS (AV) FISTULA CREATION LEFT ARM;  Surgeon: Cephus Shelling, MD;  [facility];  Service: Vascular;  Laterality: Left;   COLONOSCOPY     CYSTOSCOPY W/ URETERAL STENT PLACEMENT Bilateral 12/08/2017   Procedure: CYSTOSCOPY WITH RETROGRADE PYELOGRAM/URETERAL STENT PLACEMENT;  Surgeon: Sebastian Ache, MD;  [facility];  Service: Urology;  Laterality: Bilateral;   CYSTOSCOPY W/ URETERAL STENT PLACEMENT Bilateral 04/20/2018   Procedure: CYSTOSCOPY WITH STENT REPLACEMENT;  Surgeon: Sebastian Ache, MD;  Location: [facility];   Service: Urology;  Laterality: Bilateral;   CYSTOSCOPY W/ URETERAL STENT PLACEMENT Bilateral 08/10/2018   Procedure: CYSTOSCOPY WITH STENT REPLACEMENT, BILATERAL RETROGRADES;  Surgeon: Sebastian Ache, MD;  Location: [facility];  Service: Urology;  Laterality: Bilateral;  45 MINS   CYSTOSCOPY W/ URETERAL STENT PLACEMENT Bilateral 01/18/2019   Procedure: CYSTOSCOPY WITH RETROGRADE PYELOGRAM/URETERAL STENT PLACEMENT;  Surgeon: Sebastian Ache, MD;  Location: [facility];  Service: Urology;  Laterality: Bilateral;   CYSTOSCOPY W/ URETERAL STENT PLACEMENT Bilateral 05/24/2019   Procedure: CYSTOSCOPY WITH RETROGRADE PYELOGRAM/BILATERAL URETERAL STENT EXCHANGE;  Surgeon: Sebastian Ache, MD;  Location: [facility];  Service: Urology;  Laterality: Bilateral;   CYSTOSCOPY W/ URETERAL STENT PLACEMENT Bilateral 09/06/2019   Procedure: CYSTOSCOPY WITH RETROGRADE PYELOGRAM/URETERAL STENT EXCHANGE;  Surgeon: Sebastian Ache, MD;  Location: [facility];  Service: Urology;  Laterality: Bilateral;  45 MINS   CYSTOSCOPY W/ URETERAL STENT PLACEMENT Bilateral 05/08/2020   Procedure: CYSTOSCOPY WITH RETROGRADE PYELOGRAM/URETERAL STENT REPLACEMENT;  Surgeon: Sebastian Ache, MD;  Location: [facility];  Service: Urology;  Laterality: Bilateral;   CYSTOSCOPY W/ URETERAL STENT PLACEMENT Bilateral 06/04/2021   Procedure: CYSTOSCOPY WITH RETROGRADE PYELOGRAM/URETERAL STENT EXCHANGE;  Surgeon: Sebastian Ache, MD;  Location: [facility];  Service: Urology;  Laterality: Bilateral;  45 MINS   CYSTOSCOPY W/ URETERAL STENT PLACEMENT Bilateral 06/17/2022   Procedure: CYSTOSCOPY WITH RETROGRADE PYELOGRAM/URETERAL STENT EXCHANGE;  Surgeon: Sebastian Ache, MD;  Location: [facility];  Service: Urology;  Laterality: Bilateral;   INSERTION OF DIALYSIS CATHETER Right 05/06/2019   Procedure: INSERTION OF TUNNELED  DIALYSIS CATHETER;   Surgeon: Larina Earthly, MD;  [facility];  Service: Vascular;  Laterality: Right;   IR FLUORO GUIDE CV LINE RIGHT  11/25/2022   IR NEPHROSTOMY PLACEMENT LEFT  11/15/2017   IR NEPHROSTOMY PLACEMENT RIGHT  11/15/2017   PERIPHERAL VASCULAR INTERVENTION Left 09/04/2020   Procedure: PERIPHERAL VASCULAR INTERVENTION;  Surgeon: Chuck Hint, MD;  Location: [facility] INVASIVE CV LAB;  Service: Cardiovascular;  Laterality: Left;   PROSTATE BIOPSY N/A 12/08/2017   Procedure: BIOPSY TRANSRECTAL ULTRASONIC PROSTATE (TUBP), PERINEAL BIOPSY;  Surgeon: Sebastian Ache, MD;  [facility];  Service: Urology;  Laterality: N/A;   TRANSURETHRAL RESECTION OF BLADDER TUMOR N/A 12/08/2017   Procedure: TRANSURETHRAL RESECTION OF BLADDER TUMOR (TURBT);  Surgeon: Sebastian Ache, MD;  [facility];  Service: Urology;  Laterality: N/A;   TRICEPS TENDON REPAIR Bilateral 08/2005   UPPER GASTROINTESTINAL ENDOSCOPY      MEDICATIONS:  acetaminophen (TYLENOL) 500 MG tablet   allopurinol (ZYLOPRIM) 100 MG tablet   ALPRAZolam (XANAX) 0.25 MG tablet   amLODipine (NORVASC) 10 MG tablet   ascorbic acid (VITAMIN C) 500 MG tablet   aspirin EC 81 MG tablet   cinacalcet (SENSIPAR) 90 MG tablet   cloNIDine (CATAPRES) 0.1 MG tablet   diphenhydrAMINE (BENADRYL) 25 MG tablet   ferric citrate (AURYXIA) 1 GM 210 MG(Fe) tablet   hydrALAZINE (APRESOLINE) 50 MG tablet   lidocaine-prilocaine (EMLA) cream   losartan (COZAAR) 100 MG tablet   metoprolol succinate (TOPROL XL) 25 MG 24 hr tablet   Multiple Vitamins-Minerals (MULTIVITAMIN WITH MINERALS) tablet   multivitamin (RENA-VIT) TABS tablet   OVER THE COUNTER MEDICATION   pantoprazole (PROTONIX) 40 MG tablet   VENTOLIN HFA 108 (90 Base) MCG/ACT inhaler   vitamin B-12 (CYANOCOBALAMIN) 1000 MCG tablet   XTANDI 40 MG capsule   zinc gluconate 50 MG tablet   No current facility-administered medications for this encounter.      Jodell Cipro Ward, PA-C [facility]Surgical  Testing [phone]

## 2023-06-23 ENCOUNTER — Ambulatory Visit (HOSPITAL_COMMUNITY): Payer: Medicare Other | Admitting: Certified Registered"

## 2023-06-23 ENCOUNTER — Ambulatory Visit (HOSPITAL_COMMUNITY)
Admission: RE | Admit: 2023-06-23 | Discharge: 2023-06-23 | Disposition: A | Discharge status: Home / Self Care | Payer: Medicare Other | Source: Ambulatory Visit | Attending: Urology | Admitting: Urology

## 2023-06-23 ENCOUNTER — Encounter (HOSPITAL_COMMUNITY)
Admission: RE | Disposition: A | Discharge status: Home / Self Care | Payer: Self-pay | Source: Ambulatory Visit | Attending: Urology

## 2023-06-23 ENCOUNTER — Ambulatory Visit (HOSPITAL_COMMUNITY): Payer: Medicare Other | Admitting: Physician Assistant

## 2023-06-23 ENCOUNTER — Other Ambulatory Visit: Payer: Self-pay

## 2023-06-23 ENCOUNTER — Ambulatory Visit (HOSPITAL_COMMUNITY): Payer: Medicare Other

## 2023-06-23 ENCOUNTER — Encounter (HOSPITAL_COMMUNITY): Payer: Self-pay | Admitting: Urology

## 2023-06-23 DIAGNOSIS — N1339 Other hydronephrosis: Secondary | ICD-10-CM | POA: Diagnosis not present

## 2023-06-23 DIAGNOSIS — G4733 Obstructive sleep apnea (adult) (pediatric): Secondary | ICD-10-CM | POA: Diagnosis not present

## 2023-06-23 DIAGNOSIS — Z992 Dependence on renal dialysis: Secondary | ICD-10-CM | POA: Insufficient documentation

## 2023-06-23 DIAGNOSIS — I12 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 stage renal disease: Secondary | ICD-10-CM

## 2023-06-23 DIAGNOSIS — C61 Malignant neoplasm of prostate: Secondary | ICD-10-CM | POA: Diagnosis not present

## 2023-06-23 DIAGNOSIS — E669 Obesity, unspecified: Secondary | ICD-10-CM | POA: Insufficient documentation

## 2023-06-23 DIAGNOSIS — Z87891 Personal history of nicotine dependence: Secondary | ICD-10-CM | POA: Insufficient documentation

## 2023-06-23 DIAGNOSIS — C661 Malignant neoplasm of right ureter: Secondary | ICD-10-CM | POA: Diagnosis not present

## 2023-06-23 DIAGNOSIS — N135 Crossing vessel and stricture of ureter without hydronephrosis: Secondary | ICD-10-CM | POA: Insufficient documentation

## 2023-06-23 DIAGNOSIS — N186 End stage renal disease: Secondary | ICD-10-CM | POA: Diagnosis not present

## 2023-06-23 DIAGNOSIS — Z8546 Personal history of malignant neoplasm of prostate: Secondary | ICD-10-CM | POA: Diagnosis not present

## 2023-06-23 DIAGNOSIS — Z6835 Body mass index (BMI) 35.0-35.9, adult: Secondary | ICD-10-CM | POA: Insufficient documentation

## 2023-06-23 DIAGNOSIS — Z01818 Encounter for other preprocedural examination: Secondary | ICD-10-CM

## 2023-06-23 DIAGNOSIS — C662 Malignant neoplasm of left ureter: Secondary | ICD-10-CM

## 2023-06-23 DIAGNOSIS — K219 Gastro-esophageal reflux disease without esophagitis: Secondary | ICD-10-CM | POA: Insufficient documentation

## 2023-06-23 DIAGNOSIS — R7303 Prediabetes: Secondary | ICD-10-CM | POA: Insufficient documentation

## 2023-06-23 DIAGNOSIS — F419 Anxiety disorder, unspecified: Secondary | ICD-10-CM | POA: Insufficient documentation

## 2023-06-23 HISTORY — PX: CYSTOSCOPY WITH STENT PLACEMENT: SHX5790

## 2023-06-23 LAB — COMPREHENSIVE METABOLIC PANEL
ALT: 13 U/L (ref 0–44)
AST: 16 U/L (ref 15–41)
Albumin: 3.6 g/dL (ref 3.5–5.0)
Alkaline Phosphatase: 69 U/L (ref 38–126)
Anion gap: 15 (ref 5–15)
BUN: 53 mg/dL — ABNORMAL HIGH (ref 8–23)
CO2: 23 mmol/L (ref 22–32)
Calcium: 9.7 mg/dL (ref 8.9–10.3)
Chloride: 101 mmol/L (ref 98–111)
Creatinine, Ser: 11.92 mg/dL — ABNORMAL HIGH (ref 0.61–1.24)
GFR, Estimated: 4 mL/min — ABNORMAL LOW (ref >60)
Glucose, Bld: 88 mg/dL (ref 70–99)
Potassium: 4.8 mmol/L (ref 3.5–5.1)
Sodium: 139 mmol/L (ref 135–145)
Total Bilirubin: 0.9 mg/dL (ref <1.2)
Total Protein: 8.1 g/dL (ref 6.5–8.1)

## 2023-06-23 LAB — CBC
HCT: 28.9 % — ABNORMAL LOW (ref 39.0–52.0)
Hemoglobin: 9.6 g/dL — ABNORMAL LOW (ref 13.0–17.0)
MCH: 27.4 pg (ref 26.0–34.0)
MCHC: 33.2 g/dL (ref 30.0–36.0)
MCV: 82.3 fL (ref 80.0–100.0)
Platelets: 243 10*3/uL (ref 150–400)
RBC: 3.51 MIL/uL — ABNORMAL LOW (ref 4.22–5.81)
RDW: 17.2 % — ABNORMAL HIGH (ref 11.5–15.5)
WBC: 9.3 10*3/uL (ref 4.0–10.5)
nRBC: 0 % (ref 0.0–0.2)

## 2023-06-23 SURGERY — CYSTOSCOPY, WITH STENT INSERTION
Anesthesia: General | Site: Ureter | Laterality: Bilateral

## 2023-06-23 MED ORDER — FENTANYL CITRATE (PF) 100 MCG/2ML IJ SOLN
INTRAMUSCULAR | Status: AC
  Filled 2023-06-23: qty 2

## 2023-06-23 MED ORDER — SODIUM CHLORIDE 0.9 % IR SOLN
Status: DC | PRN
  Administered 2023-06-23: 3000 mL

## 2023-06-23 MED ORDER — LIDOCAINE 2% (20 MG/ML) 5 ML SYRINGE
INTRAMUSCULAR | Status: DC | PRN
  Administered 2023-06-23: 100 mg via INTRAVENOUS

## 2023-06-23 MED ORDER — DEXAMETHASONE SODIUM PHOSPHATE 10 MG/ML IJ SOLN
INTRAMUSCULAR | Status: AC
  Filled 2023-06-23: qty 1

## 2023-06-23 MED ORDER — IOHEXOL 300 MG/ML  SOLN
INTRAMUSCULAR | Status: DC | PRN
  Administered 2023-06-23: 6 mL

## 2023-06-23 MED ORDER — FENTANYL CITRATE (PF) 100 MCG/2ML IJ SOLN
INTRAMUSCULAR | Status: DC | PRN
  Administered 2023-06-23 (×3): 25 ug via INTRAVENOUS

## 2023-06-23 MED ORDER — DEXAMETHASONE SODIUM PHOSPHATE 10 MG/ML IJ SOLN
INTRAMUSCULAR | Status: DC | PRN
  Administered 2023-06-23: 4 mg via INTRAVENOUS

## 2023-06-23 MED ORDER — PROPOFOL 10 MG/ML IV BOLUS
INTRAVENOUS | Status: AC
  Filled 2023-06-23: qty 20

## 2023-06-23 MED ORDER — CHLORHEXIDINE GLUCONATE 0.12 % MT SOLN
15.0000 mL | Freq: Once | OROMUCOSAL | Status: AC
  Administered 2023-06-23: 15 mL via OROMUCOSAL

## 2023-06-23 MED ORDER — SODIUM CHLORIDE 0.9 % IV SOLN: INTRAVENOUS | Status: DC | PRN

## 2023-06-23 MED ORDER — PROPOFOL 10 MG/ML IV BOLUS
INTRAVENOUS | Status: DC | PRN
  Administered 2023-06-23: 170 mg via INTRAVENOUS

## 2023-06-23 MED ORDER — SODIUM CHLORIDE 0.9 % IV SOLN
1.0000 g | INTRAVENOUS | Status: AC
  Administered 2023-06-23: 1 g via INTRAVENOUS
  Filled 2023-06-23: qty 10

## 2023-06-23 MED ORDER — LACTATED RINGERS IV SOLN
INTRAVENOUS | Status: DC
Start: 2023-06-23 — End: 2023-06-23

## 2023-06-23 MED ORDER — ONDANSETRON HCL 4 MG/2ML IJ SOLN
INTRAMUSCULAR | Status: AC
Start: 2023-06-23 — End: ?
  Filled 2023-06-23: qty 2

## 2023-06-23 MED ORDER — ORAL CARE MOUTH RINSE: 15.0000 mL | Freq: Once | OROMUCOSAL | Status: AC

## 2023-06-23 MED ORDER — ONDANSETRON HCL 4 MG/2ML IJ SOLN
INTRAMUSCULAR | Status: DC | PRN
  Administered 2023-06-23: 4 mg via INTRAVENOUS

## 2023-06-23 MED ORDER — ACETAMINOPHEN 500 MG PO TABS
1000.0000 mg | ORAL_TABLET | Freq: Once | ORAL | Status: AC
Start: 2023-06-23 — End: 2023-06-23
  Administered 2023-06-23: 1000 mg via ORAL
  Filled 2023-06-23: qty 2

## 2023-06-23 SURGICAL SUPPLY — 15 items
BAG URO CATCHER STRL LF (MISCELLANEOUS) ×1 IMPLANT
BASKET ZERO TIP NITINOL 2.4FR (BASKET) IMPLANT
CATH URETL OPEN END 6FR 70 (CATHETERS) IMPLANT
CLOTH BEACON ORANGE TIMEOUT ST (SAFETY) ×1 IMPLANT
GLOVE SURG LX STRL 7.5 STRW (GLOVE) ×1 IMPLANT
GOWN STRL REUS W/ TWL XL LVL3 (GOWN DISPOSABLE) ×1 IMPLANT
GUIDEWIRE ANG ZIPWIRE 038X150 (WIRE) ×1 IMPLANT
GUIDEWIRE STR DUAL SENSOR (WIRE) IMPLANT
KIT TURNOVER KIT A (KITS) IMPLANT
MANIFOLD NEPTUNE II (INSTRUMENTS) ×1 IMPLANT
PACK CYSTO (CUSTOM PROCEDURE TRAY) ×1 IMPLANT
STENT CONTOUR 7FRX26X.038 (STENTS) IMPLANT
TUBE PU 8FR 16IN ENFIT (TUBING) IMPLANT
TUBING CONNECTING 10 (TUBING) ×1 IMPLANT
TUBING UROLOGY SET (TUBING) IMPLANT

## 2023-06-23 NOTE — Transfer of Care (Signed)
Immediate Anesthesia Transfer of Care Note  Patient: Devon Cook  Procedure(s) Performed: CYSTOSCOPY WITH BILATERAL URETERAL STENT EXCHANGE (Bilateral: Ureter)  Patient Location: PACU  Anesthesia Type:General  Level of Consciousness: awake, alert , and patient cooperative  Airway & Oxygen Therapy: Patient Spontanous Breathing and Patient connected to face mask oxygen  Post-op Assessment: Report given to RN and Post -op Vital signs reviewed and stable  Post vital signs: Reviewed and stable  Last Vitals:  Vitals Value Taken Time  BP 164/92 06/23/23 1327  Temp    Pulse 66 06/23/23 1328  Resp 18 06/23/23 1328  SpO2 100 % 06/23/23 1328  Vitals shown include unfiled device data.  Last Pain:  Vitals:   06/23/23 0935  TempSrc:   PainSc: 0-No pain         Complications: No notable events documented.

## 2023-06-23 NOTE — Brief Op Note (Signed)
06/23/2023  1:16 PM  PATIENT:  Devon Cook  62 y.o. male  PRE-OPERATIVE DIAGNOSIS:  MALIGANT URETERAL OBSTRUCTION  POST-OPERATIVE DIAGNOSIS:  MALIGANT URETERAL OBSTRUCTION  PROCEDURE:  Procedure(s) with comments: CYSTOSCOPY WITH BILATERAL URETERAL STENT EXCHANGE (Bilateral) - 45 MINUTES  SURGEON:  Surgeons and Role:    * Zackeriah Kissler, Delbert Phenix., MD - Primary  PHYSICIAN ASSISTANT:   ASSISTANTS: none   ANESTHESIA:   general  EBL:  minimal   BLOOD ADMINISTERED:none  DRAINS: none    SPECIMEN:  Source of Specimen:  bilateral stents  DISPOSITION OF SPECIMEN:   discard  COUNTS:  YES  TOURNIQUET:  * No tourniquets in log *  DICTATION: .Other Dictation: Dictation Number [id number]  PLAN OF CARE: Discharge to home after PACU  PATIENT DISPOSITION:  PACU - hemodynamically stable.   Delay start of Pharmacological VTE agent (>24hrs) due to surgical blood loss or risk of bleeding: yes

## 2023-06-23 NOTE — Anesthesia Preprocedure Evaluation (Addendum)
Anesthesia Evaluation  Patient identified by MRN, date of birth, ID band Patient awake    Reviewed: Allergy & Precautions, NPO status , Patient's Chart, lab work & pertinent test results, reviewed documented beta blocker date and time   Airway Mallampati: II  TM Distance: >3 FB Neck ROM: Full    Dental  (+) Teeth Intact, Dental Advisory Given   Pulmonary sleep apnea , former smoker   Pulmonary exam normal breath sounds clear to auscultation       Cardiovascular hypertension, Pt. on home beta blockers and Pt. on medications (-) angina Normal cardiovascular exam Rhythm:Regular Rate:Normal     Neuro/Psych  PSYCHIATRIC DISORDERS Anxiety Depression    negative neurological ROS     GI/Hepatic ,GERD  Medicated,,(+) Hepatitis -, C  Endo/Other  Obesity   Renal/GU ESRF and DialysisRenal diseaseMALIGANT URETERAL OBSTRUCTION   Prostate 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    Musculoskeletal  (+) Arthritis ,    Abdominal   Peds  Hematology negative hematology ROS (+)   Anesthesia Other Findings Day of surgery medications reviewed with the patient.  Reproductive/Obstetrics                              Anesthesia Physical Anesthesia Plan  ASA: 4  Anesthesia Plan: General   Post-op Pain Management: Tylenol PO (pre-op)*   Induction: Intravenous  PONV Risk Score and Plan: 3 and Dexamethasone and Ondansetron  Airway Management Planned: LMA  Additional Equipment:   Intra-op Plan:   Post-operative Plan: Extubation in OR  Informed Consent: I have reviewed the patients History and Physical, chart, labs and discussed the procedure including the risks, benefits and alternatives for the proposed anesthesia with the patient or authorized representative who has indicated his/her understanding and acceptance.     Dental advisory given  Plan Discussed with: CRNA  Anesthesia Plan Comments:         Anesthesia Quick Evaluation

## 2023-06-23 NOTE — Discharge Instructions (Signed)
1 - You may have urinary urgency (bladder spasms) and bloody urine on / off with stent in place. This is normal. ° °2 - Call MD or go to ER for fever >102, severe pain / nausea / vomiting not relieved by medications, or acute change in medical status ° °

## 2023-06-23 NOTE — Anesthesia Procedure Notes (Signed)
Procedure Name: LMA Insertion Date/Time: 06/23/2023 12:51 PM  Performed by: Sindy Guadeloupe, CRNAPre-anesthesia Checklist: Patient identified, Emergency Drugs available, Suction available, Patient being monitored and Timeout performed Patient Re-evaluated:Patient Re-evaluated prior to induction Oxygen Delivery Method: Circle system utilized Preoxygenation: Pre-oxygenation with 100% oxygen Induction Type: IV induction Ventilation: Mask ventilation without difficulty LMA: LMA inserted and LMA with gastric port inserted LMA Size: 5.0 Number of attempts: 1 Tube secured with: Tape Dental Injury: Teeth and Oropharynx as per pre-operative assessment

## 2023-06-23 NOTE — Anesthesia Postprocedure Evaluation (Signed)
Anesthesia Post Note  Patient: Devon Cook  Procedure(s) Performed: CYSTOSCOPY WITH BILATERAL URETERAL STENT EXCHANGE (Bilateral: Ureter)     Patient location during evaluation: PACU Anesthesia Type: General Level of consciousness: awake and alert Pain management: pain level controlled Vital Signs Assessment: post-procedure vital signs reviewed and stable Respiratory status: spontaneous breathing, nonlabored ventilation and respiratory function stable Cardiovascular status: blood pressure returned to baseline and stable Postop Assessment: no apparent nausea or vomiting Anesthetic complications: no   No notable events documented.  Last Vitals:  Vitals:   06/23/23 1345 06/23/23 1403  BP: (!) 175/100 (!) 210/104  Pulse: 77 68  Resp: 15 20  Temp:  (!) 36.4 C  SpO2: 98% 95%    Last Pain:  Vitals:   06/23/23 1403  TempSrc:   PainSc: 0-No pain                 Collene Schlichter

## 2023-06-23 NOTE — Op Note (Unsigned)
NAME: Devon Cook, Devon Cook MEDICAL RECORD NO: [id number] ACCOUNT NO: [id number] DATE OF BIRTH: Nov 22, 1960 FACILITY: [facility] LOCATION: [facility] PHYSICIAN: Sebastian Ache, MD  Operative Report   DATE OF PROCEDURE: 06/23/2023  PREOPERATIVE DIAGNOSIS:  Bilateral malignant ureteral obstruction.  POSTOPERATIVE DIAGNOSIS:  Bilateral malignant ureteral obstruction.  PROCEDURE PERFORMED: 1.  Cystoscopy, bilateral retrograde pyelogram interpretation. 2.  Exchange of bilateral ureteral stents.  ESTIMATED BLOOD LOSS:  None.  COMPLICATIONS:  None.  SPECIMENS:  Bilateral stents for discarded.  FINDINGS: 1. Minimal encrustation of in situ bilateral stents. 2. Successful exchange of the bilateral ureteral stent, proximal end in the renal pelvis, distal end in urinary bladder.  INDICATION:  The patient is a very pleasant 62 year old man with a history of de novo metastatic prostate cancer diagnosed years ago on evaluation of acute renal failure and malignant obstruction.  He has been managed with stents for a number of years  and has had very good response to systemic therapy for his metastatic prostate cancer, which is currently in a very good remission state.  He unfortunately progressed to end-stage renal disease several years ago and is dialysis dependent; however, he  still makes significant volume of urine and he finds great volume shifts with dialysis very fatiguing.  Therefore, he wishes to maintain ureteral integrity.  Therefore, we are continuing to manage his malignant obstruction with stenting, now changed  approximately every yearly as he encrusts very slowly.  He presents for this today.  Informed consent was obtained and placed in my record.  DESCRIPTION OF PROCEDURE:  The patient being identified and was verified and procedure being cysto, bilateral stent exchange was confirmed.  Procedure timeout was performed.  Intravenous antibiotics were administered.  General LMA anesthesia induced and   the patient was placed into a low lithotomy position.  Sterile field was created, prepped and draped the patient's penis, perineum, and proximal thighs using iodine.  Cystoscopy was performed using a 21-French rigid cystoscope with offset lens.   Inspection of the anterior and posterior urethra did reveal some loose membranous thickness, stricture in the proximal pendulous urethra.  This was easily dilated just with the cystoscope.  Inspection of the bladder revealed distal bilateral stents in  situ.  There was minimal encrustation.  The distal end of the right stent was grasped proximally at the level of the urethra meatus.  Through a 0.38 wire, the ZIPwire was advanced to the level of the upper pole.  The stent was exchanged for an open-ended  catheter and right retrograde pyelogram was obtained.  Right retrograde pyelogram demonstrated a single right ureter and single system right kidney with mild hydronephrosis as expected.  A new 7 x 26 cm contour-type stent was carefully placed over the ZIPwire using fluoroscopic guidance.  Good proximal and  distal plane were noted.  Next, distal end of the left stent was grasped, brought to the level of the urethra meatus, exchanged for an open-ended catheter via the ZIPwire, and left retrograde pyelogram was obtained.  Left retrograde pyelogram demonstrated single left ureter, single system left kidney.  Mild hydronephrosis as expected and a new semi-rigid contour type stent was placed over the left ZIPwire using fluoroscopic guidance.  Good proximal and distal planes  were noted.  The patient was then terminated.  The patient tolerated the procedure well.  No immediate periprocedural complications.  The patient was taken to postanesthesia care unit in stable condition.  Plan for discharge home.   PUS D: 06/23/2023 1:20:35 pm T: 06/23/2023 5:47:00 pm  JOB: [id number]

## 2023-06-23 NOTE — H&P (Signed)
Devon Cook is an 62 y.o. male.    Chief Complaint: Pre-Op BILATERAL Ureteral Stent Exhcange  HPI:   1 - Prostate Cancer-  Initial Diagnosis - Gl9 metastatic cancer 12/2017 on eval PSA 787.  Primary Therapy - androgen deprivation.  Prior Treatments - androgen deprivation.  Current Treatment - androgen deprivation + Xtandi 160  Most Recent Restaging - 02/2022 CT, BS mostly quiescent sterum / rib mets, some spine as well.   Recent Course -  09/2020 - PSA <.06, T10 ==> Eligard 45 ; 12/2020 PSA <.06, T <3; 04/2021 (labs pending) ==> Eligard 45; 06/2021 - bilateral stent change 7x26 (1 year intervals)  07/2021 - PA <.01, T17; 11/2021 ==> Eligard 45, remain Xtandi 40mg ; 02/2022 PSA <.04, T 17 ==> remain Xtandi;  05/2022 PSA <.04 ==> Eligard 45, remain Xtandi - bilateral stent change 7x26 (1 year intervals ok)1  09/2022 - ==>PSA <.04, remain Xtandi; 01/2023 ==> PSA <.04, Eligard 45, remain Xtandi; 04/2023 PSA <.04, remain XTandi   2 - Malignant Ureteral Obstruction - Cr 17 by PCP labs on eval malaize 11/2017. Non-con CT with hydro to level of thickened bladder. Bilateral neph tubes placed 11/2017 and converted to bilateral 7x26 stents 12/2017 in OR and then as per above. Now established with [facility] with GFR 15 range, requires aranesp and PRN transfusion.   3- End Stage Renal Disease - Cr 4-8's after bilateral renal decompression c/w permanent renal damage. Hemodialysis initiated 2020 with Dr. Eliott Nine at [facility]. Has Left AVF that is maturing. May consider PD.    PMH sig for pre-DM, obesity, L spine arthritis. NO isschemic CV disease / blood thinners. His daughter Devon Cook has done some shadowing with [place], interested in NSG.   Today "Devon Cook" is seen to proceed with BILATERAL ureteral stent exchange. No interval fevers. He is 1 year since last exchange which he has come well with.    Past Medical History:  Diagnosis Date   Anemia, chronic renal failure    followed by dr Signe Colt--- dx 05/  2019---  treated with Iron infusions and procrit;   and blood transfusions   Anxiety    Arthritis    Bilateral hydronephrosis urologist-- dr Berneice Heinrich   malignant-- chronic;  treated with ureteral stents   Chronic gastritis    07-17-2019 and duodenitis   Depression    ED (erectile dysfunction)    Edema of both lower extremities    occ le edema wears compresssion hose daily   End stage renal failure on dialysis [facility])    nephrologist-- dr Signe Colt (@ [facility])-- secondary to obstructive uropathy secon  dary to prostate cancer hemodialysis mon/wed/fri @ [facility]---started first week of Nov 2020   GERD (gastroesophageal reflux disease)    Gout 2019   Herpes genitalis in men    History of adenomatous polyp of colon    History of hepatitis C 2017   dx 11/ 207-- treated with Harvoni (in epic 04/ 2019 lab result non-detectable)   History of lower GI bleeding 11/2017   Hypertension    followed by pcp   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 stage renal disease (HCC)    Metastatic cancer to bone (HCC)    secondary to primary prostate cancer  w/ progressive pain   OSA (obstructive sleep apnea)    severe osa w/ nocturnal hyoxemia per study 06-19-2019,  pt given cpap ( however on 08-30-2019 per pt having issues with mask fitting  and on a new one so he not using the cpap);   (03-20-2020 still not using cpap, no mask)   Palpitations    isolated event at ED 10-05-2019 Aflutter/ Afib versus SVT ;   refer to pt's cardiologist note w/ Dr Shari Prows   Pneumonia    Prostate 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 [facility]) urologist-- dr Berneice Heinrich   dx 06/ 2019-- advanced w/ mets to bone and pelvic lymphadeopathy   S/P arteriovenous (AV) fistula creation 03/2019   followed  by dr Edilia Bo (vascular)  03-15-2019  by dr Chestine Spore, left branhiocephallic;  last intervention balloon angioplasty 09-04-2020   Secondary hyperparathyroidism of renal origin [facility])    Urgency  of urination    Wears glasses     Past Surgical History:  Procedure Laterality Date   A/V FISTULAGRAM Left 09/04/2020   Procedure: A/V FISTULAGRAM;  Surgeon: Chuck Hint, MD;  Location: [facility] INVASIVE CV LAB;  Service: Cardiovascular;  Laterality: Left;   AV FISTULA PLACEMENT Left 03/15/2019   Procedure: ARTERIOVENOUS (AV) FISTULA CREATION LEFT ARM;  Surgeon: Cephus Shelling, MD;  Location: [facility];  Service: Vascular;  Laterality: Left;   COLONOSCOPY     CYSTOSCOPY W/ URETERAL STENT PLACEMENT Bilateral 12/08/2017   Procedure: CYSTOSCOPY WITH RETROGRADE PYELOGRAM/URETERAL STENT PLACEMENT;  Surgeon: Sebastian Ache, MD;  Location: [facility];  Service: Urology;  Laterality: Bilateral;   CYSTOSCOPY W/ URETERAL STENT PLACEMENT Bilateral 04/20/2018   Procedure: CYSTOSCOPY WITH STENT REPLACEMENT;  Surgeon: Sebastian Ache, MD;  Location: [facility];  Service: Urology;  Laterality: Bilateral;   CYSTOSCOPY W/ URETERAL STENT PLACEMENT Bilateral 08/10/2018   Procedure: CYSTOSCOPY WITH STENT REPLACEMENT, BILATERAL RETROGRADES;  Surgeon: Sebastian Ache, MD;  Location: [facility];  Service: Urology;  Laterality: Bilateral;  45 MINS   CYSTOSCOPY W/ URETERAL STENT PLACEMENT Bilateral 01/18/2019   Procedure: CYSTOSCOPY WITH RETROGRADE PYELOGRAM/URETERAL STENT PLACEMENT;  Surgeon: Sebastian Ache, MD;  Location: [facility];  Service: Urology;  Laterality: Bilateral;   CYSTOSCOPY W/ URETERAL STENT PLACEMENT Bilateral 05/24/2019   Procedure: CYSTOSCOPY WITH RETROGRADE PYELOGRAM/BILATERAL URETERAL STENT EXCHANGE;  Surgeon: Sebastian Ache, MD;  Location: [facility];  Service: Urology;  Laterality: Bilateral;   CYSTOSCOPY W/ URETERAL STENT PLACEMENT Bilateral 09/06/2019   Procedure: CYSTOSCOPY WITH RETROGRADE PYELOGRAM/URETERAL STENT EXCHANGE;  Surgeon: Sebastian Ache, MD;  Location: [facility];  Service: Urology;  Laterality:  Bilateral;  45 MINS   CYSTOSCOPY W/ URETERAL STENT PLACEMENT Bilateral 05/08/2020   Procedure: CYSTOSCOPY WITH RETROGRADE PYELOGRAM/URETERAL STENT REPLACEMENT;  Surgeon: Sebastian Ache, MD;  Location: [facility];  Service: Urology;  Laterality: Bilateral;   CYSTOSCOPY W/ URETERAL STENT PLACEMENT Bilateral 06/04/2021   Procedure: CYSTOSCOPY WITH RETROGRADE PYELOGRAM/URETERAL STENT EXCHANGE;  Surgeon: Sebastian Ache, MD;  Location: [facility];  Service: Urology;  Laterality: Bilateral;  45 MINS   CYSTOSCOPY W/ URETERAL STENT PLACEMENT Bilateral 06/17/2022   Procedure: CYSTOSCOPY WITH RETROGRADE PYELOGRAM/URETERAL STENT EXCHANGE;  Surgeon: Sebastian Ache, MD;  Location: [facility];  Service: Urology;  Laterality: Bilateral;   INSERTION OF DIALYSIS CATHETER Right 05/06/2019   Procedure: INSERTION OF TUNNELED  DIALYSIS CATHETER;  Surgeon: Larina Earthly, MD;  Location: [facility];  Service: Vascular;  Laterality: Right;   IR FLUORO GUIDE CV LINE RIGHT  11/25/2022   IR NEPHROSTOMY PLACEMENT LEFT  11/15/2017   IR NEPHROSTOMY PLACEMENT RIGHT  11/15/2017   PERIPHERAL VASCULAR INTERVENTION Left 09/04/2020   Procedure: PERIPHERAL VASCULAR INTERVENTION;  Surgeon: Waverly Ferrari  S, MD;  Location: [facility] INVASIVE CV LAB;  Service: Cardiovascular;  Laterality: Left;   PROSTATE BIOPSY N/A 12/08/2017   Procedure: BIOPSY TRANSRECTAL ULTRASONIC PROSTATE (TUBP), PERINEAL BIOPSY;  Surgeon: Sebastian Ache, MD;  Location: [facility];  Service: Urology;  Laterality: N/A;   TRANSURETHRAL RESECTION OF BLADDER TUMOR N/A 12/08/2017   Procedure: TRANSURETHRAL RESECTION OF BLADDER TUMOR (TURBT);  Surgeon: Sebastian Ache, MD;  Location: [facility];  Service: Urology;  Laterality: N/A;   TRICEPS TENDON REPAIR Bilateral 08/2005   UPPER GASTROINTESTINAL ENDOSCOPY      Family History  Problem Relation Age of Onset   Hypertension Mother    Heart disease Mother    Diabetes Sister    Diabetes  Brother    Prostate cancer Maternal Grandfather    Diabetes Sister    Prostate cancer Maternal Uncle    Colon polyps Neg Hx    Esophageal cancer Neg Hx    Stomach cancer Neg Hx    Rectal cancer Neg Hx    Social History:  reports that he has quit smoking. His smoking use included cigarettes, pipe, and cigars. He has a 3 pack-year smoking history. He has never used smokeless tobacco. He reports current alcohol use. He reports that he does not use drugs.  Allergies: No Known Allergies  Medications Prior to Admission  Medication Sig Dispense Refill   acetaminophen (TYLENOL) 500 MG tablet Take 1,500 mg by mouth every 6 (six) hours as needed for moderate pain.     allopurinol (ZYLOPRIM) 100 MG tablet TAKE 1 TABLET BY MOUTH ONCE DAILY 30 tablet 0   amLODipine (NORVASC) 10 MG tablet TAKE 1 TABLET BY MOUTH EVERYDAY AT BEDTIME 90 tablet 0   ascorbic acid (VITAMIN C) 500 MG tablet Take 1,000 mg by mouth daily.     aspirin EC 81 MG tablet Take 81 mg by mouth daily.     cinacalcet (SENSIPAR) 90 MG tablet Take 90 mg by mouth every evening.     cloNIDine (CATAPRES) 0.1 MG tablet Take 0.1 mg by mouth 2 (two) times daily as needed (high blood pressure).     diphenhydrAMINE (BENADRYL) 25 MG tablet Take 25 mg by mouth every 6 (six) hours as needed for allergies.     ferric citrate (AURYXIA) 1 GM 210 MG(Fe) tablet Take 630 mg by mouth 3 (three) times daily with meals.     hydrALAZINE (APRESOLINE) 50 MG tablet Take 50 mg by mouth 3 (three) times daily.     losartan (COZAAR) 100 MG tablet Take 100 mg by mouth daily.     metoprolol succinate (TOPROL XL) 25 MG 24 hr tablet Take 1 tablet (25 mg total) by mouth daily. Please make overdue appt with Dr. Shari Prows before anymore refills. Thank you 1st attempt (Patient taking differently: Take 25 mg by mouth at bedtime. Please make overdue appt with Dr. Shari Prows before anymore refills. Thank you 1st attempt) 30 tablet 0   Multiple Vitamins-Minerals (MULTIVITAMIN WITH  MINERALS) tablet Take 1 tablet by mouth daily.     multivitamin (RENA-VIT) TABS tablet Take 1 tablet by mouth daily.     OVER THE COUNTER MEDICATION Take 1 capsule by mouth daily. Seamoss     pantoprazole (PROTONIX) 40 MG tablet Take 1 tablet (40 mg total) by mouth 2 (two) times daily. 112 tablet 0   vitamin B-12 (CYANOCOBALAMIN) 1000 MCG tablet Take 1,000 mcg by mouth daily.     XTANDI 40 MG capsule Take 160 mg by mouth at bedtime.  zinc gluconate 50 MG tablet Take 50 mg by mouth daily.     ALPRAZolam (XANAX) 0.25 MG tablet Take 1 tablet (0.25 mg total) by mouth daily as needed. for anxiety 30 tablet 0   lidocaine-prilocaine (EMLA) cream Apply 1 Application topically every Monday, Wednesday, and Friday with hemodialysis.     VENTOLIN HFA 108 (90 Base) MCG/ACT inhaler INHALE 2 PUFFS BY MOUTH EVERY 4 HOURS AS NEEDED FOR WHEEZE OR FOR SHORTNESS OF BREATH 18 each 1    No results found for this or any previous visit (from the past 48 hours). No results found.  Review of Systems  Constitutional:  Negative for chills and fever.  All other systems reviewed and are negative.   There were no vitals taken for this visit. Physical Exam Vitals reviewed.  HENT:     Head: Normocephalic.  Eyes:     Pupils: Pupils are equal, round, and reactive to light.  Cardiovascular:     Rate and Rhythm: Normal rate.  Pulmonary:     Effort: Pulmonary effort is normal.  Abdominal:     General: Abdomen is flat.     Comments: Stable truncal obesity  Genitourinary:    Comments: No CVAT Musculoskeletal:        General: Normal range of motion.     Cervical back: Normal range of motion.  Neurological:     General: No focal deficit present.     Mental Status: He is alert.  Psychiatric:        Mood and Affect: Mood normal.      Assessment/Plan  Proceed as planned with cysto, BILATERAL ureteral stent exchange. Risks, benefits, alternatives, expected peri-op course discussed previously and reiterated  today.   Loletta Parish., MD 06/23/2023, 8:56 AM

## 2023-06-24 ENCOUNTER — Encounter (HOSPITAL_COMMUNITY): Payer: Self-pay | Admitting: Urology

## 2023-06-24 DIAGNOSIS — R52 Pain, unspecified: Secondary | ICD-10-CM | POA: Diagnosis not present

## 2023-06-24 DIAGNOSIS — N186 End stage renal disease: Secondary | ICD-10-CM | POA: Diagnosis not present

## 2023-06-24 DIAGNOSIS — D689 Coagulation defect, unspecified: Secondary | ICD-10-CM | POA: Diagnosis not present

## 2023-06-24 DIAGNOSIS — T7840XA Allergy, unspecified, initial encounter: Secondary | ICD-10-CM | POA: Diagnosis not present

## 2023-06-24 DIAGNOSIS — D631 Anemia in chronic kidney disease: Secondary | ICD-10-CM | POA: Diagnosis not present

## 2023-06-24 DIAGNOSIS — N2581 Secondary hyperparathyroidism of renal origin: Secondary | ICD-10-CM | POA: Diagnosis not present

## 2023-06-24 DIAGNOSIS — Z992 Dependence on renal dialysis: Secondary | ICD-10-CM | POA: Diagnosis not present

## 2023-06-24 DIAGNOSIS — R197 Diarrhea, unspecified: Secondary | ICD-10-CM | POA: Diagnosis not present

## 2023-06-24 DIAGNOSIS — D509 Iron deficiency anemia, unspecified: Secondary | ICD-10-CM | POA: Diagnosis not present

## 2023-06-25 DIAGNOSIS — T7840XA Allergy, unspecified, initial encounter: Secondary | ICD-10-CM | POA: Diagnosis not present

## 2023-06-25 DIAGNOSIS — D509 Iron deficiency anemia, unspecified: Secondary | ICD-10-CM | POA: Diagnosis not present

## 2023-06-25 DIAGNOSIS — D689 Coagulation defect, unspecified: Secondary | ICD-10-CM | POA: Diagnosis not present

## 2023-06-25 DIAGNOSIS — D631 Anemia in chronic kidney disease: Secondary | ICD-10-CM | POA: Diagnosis not present

## 2023-06-25 DIAGNOSIS — R197 Diarrhea, unspecified: Secondary | ICD-10-CM | POA: Diagnosis not present

## 2023-06-25 DIAGNOSIS — N186 End stage renal disease: Secondary | ICD-10-CM | POA: Diagnosis not present

## 2023-06-25 DIAGNOSIS — R52 Pain, unspecified: Secondary | ICD-10-CM | POA: Diagnosis not present

## 2023-06-25 DIAGNOSIS — N2581 Secondary hyperparathyroidism of renal origin: Secondary | ICD-10-CM | POA: Diagnosis not present

## 2023-06-25 DIAGNOSIS — Z992 Dependence on renal dialysis: Secondary | ICD-10-CM | POA: Diagnosis not present

## 2023-06-26 ENCOUNTER — Ambulatory Visit: Payer: Medicare Other | Admitting: Family Medicine

## 2023-06-26 DIAGNOSIS — N1831 Chronic kidney disease, stage 3a: Secondary | ICD-10-CM

## 2023-06-26 DIAGNOSIS — I10 Essential (primary) hypertension: Secondary | ICD-10-CM

## 2023-06-26 DIAGNOSIS — N5201 Erectile dysfunction due to arterial insufficiency: Secondary | ICD-10-CM

## 2023-06-26 DIAGNOSIS — G4733 Obstructive sleep apnea (adult) (pediatric): Secondary | ICD-10-CM

## 2023-06-26 DIAGNOSIS — C61 Malignant neoplasm of prostate: Secondary | ICD-10-CM

## 2023-06-26 DIAGNOSIS — N186 End stage renal disease: Secondary | ICD-10-CM

## 2023-06-27 DIAGNOSIS — D509 Iron deficiency anemia, unspecified: Secondary | ICD-10-CM | POA: Diagnosis not present

## 2023-06-27 DIAGNOSIS — Z992 Dependence on renal dialysis: Secondary | ICD-10-CM | POA: Diagnosis not present

## 2023-06-27 DIAGNOSIS — R52 Pain, unspecified: Secondary | ICD-10-CM | POA: Diagnosis not present

## 2023-06-27 DIAGNOSIS — D689 Coagulation defect, unspecified: Secondary | ICD-10-CM | POA: Diagnosis not present

## 2023-06-27 DIAGNOSIS — R197 Diarrhea, unspecified: Secondary | ICD-10-CM | POA: Diagnosis not present

## 2023-06-27 DIAGNOSIS — T7840XA Allergy, unspecified, initial encounter: Secondary | ICD-10-CM | POA: Diagnosis not present

## 2023-06-27 DIAGNOSIS — D631 Anemia in chronic kidney disease: Secondary | ICD-10-CM | POA: Diagnosis not present

## 2023-06-27 DIAGNOSIS — N2581 Secondary hyperparathyroidism of renal origin: Secondary | ICD-10-CM | POA: Diagnosis not present

## 2023-06-27 DIAGNOSIS — N186 End stage renal disease: Secondary | ICD-10-CM | POA: Diagnosis not present

## 2023-06-30 DIAGNOSIS — N186 End stage renal disease: Secondary | ICD-10-CM | POA: Diagnosis not present

## 2023-06-30 DIAGNOSIS — N2581 Secondary hyperparathyroidism of renal origin: Secondary | ICD-10-CM | POA: Diagnosis not present

## 2023-06-30 DIAGNOSIS — T7840XA Allergy, unspecified, initial encounter: Secondary | ICD-10-CM | POA: Diagnosis not present

## 2023-06-30 DIAGNOSIS — D689 Coagulation defect, unspecified: Secondary | ICD-10-CM | POA: Diagnosis not present

## 2023-06-30 DIAGNOSIS — Z992 Dependence on renal dialysis: Secondary | ICD-10-CM | POA: Diagnosis not present

## 2023-06-30 DIAGNOSIS — R52 Pain, unspecified: Secondary | ICD-10-CM | POA: Diagnosis not present

## 2023-06-30 DIAGNOSIS — R197 Diarrhea, unspecified: Secondary | ICD-10-CM | POA: Diagnosis not present

## 2023-06-30 DIAGNOSIS — D509 Iron deficiency anemia, unspecified: Secondary | ICD-10-CM | POA: Diagnosis not present

## 2023-06-30 DIAGNOSIS — D631 Anemia in chronic kidney disease: Secondary | ICD-10-CM | POA: Diagnosis not present

## 2023-07-02 DIAGNOSIS — D509 Iron deficiency anemia, unspecified: Secondary | ICD-10-CM | POA: Diagnosis not present

## 2023-07-02 DIAGNOSIS — D631 Anemia in chronic kidney disease: Secondary | ICD-10-CM | POA: Diagnosis not present

## 2023-07-02 DIAGNOSIS — D689 Coagulation defect, unspecified: Secondary | ICD-10-CM | POA: Diagnosis not present

## 2023-07-02 DIAGNOSIS — R197 Diarrhea, unspecified: Secondary | ICD-10-CM | POA: Diagnosis not present

## 2023-07-02 DIAGNOSIS — R52 Pain, unspecified: Secondary | ICD-10-CM | POA: Diagnosis not present

## 2023-07-02 DIAGNOSIS — N186 End stage renal disease: Secondary | ICD-10-CM | POA: Diagnosis not present

## 2023-07-02 DIAGNOSIS — T7840XA Allergy, unspecified, initial encounter: Secondary | ICD-10-CM | POA: Diagnosis not present

## 2023-07-02 DIAGNOSIS — N2581 Secondary hyperparathyroidism of renal origin: Secondary | ICD-10-CM | POA: Diagnosis not present

## 2023-07-02 DIAGNOSIS — Z992 Dependence on renal dialysis: Secondary | ICD-10-CM | POA: Diagnosis not present

## 2023-07-04 DIAGNOSIS — R197 Diarrhea, unspecified: Secondary | ICD-10-CM | POA: Diagnosis not present

## 2023-07-04 DIAGNOSIS — D631 Anemia in chronic kidney disease: Secondary | ICD-10-CM | POA: Diagnosis not present

## 2023-07-04 DIAGNOSIS — N138 Other obstructive and reflux uropathy: Secondary | ICD-10-CM | POA: Diagnosis not present

## 2023-07-04 DIAGNOSIS — N186 End stage renal disease: Secondary | ICD-10-CM | POA: Diagnosis not present

## 2023-07-04 DIAGNOSIS — T7840XA Allergy, unspecified, initial encounter: Secondary | ICD-10-CM | POA: Diagnosis not present

## 2023-07-04 DIAGNOSIS — D509 Iron deficiency anemia, unspecified: Secondary | ICD-10-CM | POA: Diagnosis not present

## 2023-07-04 DIAGNOSIS — Z992 Dependence on renal dialysis: Secondary | ICD-10-CM | POA: Diagnosis not present

## 2023-07-04 DIAGNOSIS — N2581 Secondary hyperparathyroidism of renal origin: Secondary | ICD-10-CM | POA: Diagnosis not present

## 2023-07-04 DIAGNOSIS — D689 Coagulation defect, unspecified: Secondary | ICD-10-CM | POA: Diagnosis not present

## 2023-07-04 DIAGNOSIS — R52 Pain, unspecified: Secondary | ICD-10-CM | POA: Diagnosis not present

## 2023-07-07 DIAGNOSIS — N2581 Secondary hyperparathyroidism of renal origin: Secondary | ICD-10-CM | POA: Diagnosis not present

## 2023-07-07 DIAGNOSIS — D689 Coagulation defect, unspecified: Secondary | ICD-10-CM | POA: Diagnosis not present

## 2023-07-07 DIAGNOSIS — R52 Pain, unspecified: Secondary | ICD-10-CM | POA: Diagnosis not present

## 2023-07-07 DIAGNOSIS — Z992 Dependence on renal dialysis: Secondary | ICD-10-CM | POA: Diagnosis not present

## 2023-07-07 DIAGNOSIS — R197 Diarrhea, unspecified: Secondary | ICD-10-CM | POA: Diagnosis not present

## 2023-07-07 DIAGNOSIS — T7840XA Allergy, unspecified, initial encounter: Secondary | ICD-10-CM | POA: Diagnosis not present

## 2023-07-07 DIAGNOSIS — D631 Anemia in chronic kidney disease: Secondary | ICD-10-CM | POA: Diagnosis not present

## 2023-07-07 DIAGNOSIS — N186 End stage renal disease: Secondary | ICD-10-CM | POA: Diagnosis not present

## 2023-07-07 DIAGNOSIS — D509 Iron deficiency anemia, unspecified: Secondary | ICD-10-CM | POA: Diagnosis not present

## 2023-07-10 DIAGNOSIS — R52 Pain, unspecified: Secondary | ICD-10-CM | POA: Diagnosis not present

## 2023-07-10 DIAGNOSIS — D509 Iron deficiency anemia, unspecified: Secondary | ICD-10-CM | POA: Diagnosis not present

## 2023-07-10 DIAGNOSIS — D689 Coagulation defect, unspecified: Secondary | ICD-10-CM | POA: Diagnosis not present

## 2023-07-10 DIAGNOSIS — Z992 Dependence on renal dialysis: Secondary | ICD-10-CM | POA: Diagnosis not present

## 2023-07-10 DIAGNOSIS — N2581 Secondary hyperparathyroidism of renal origin: Secondary | ICD-10-CM | POA: Diagnosis not present

## 2023-07-10 DIAGNOSIS — N186 End stage renal disease: Secondary | ICD-10-CM | POA: Diagnosis not present

## 2023-07-10 DIAGNOSIS — R197 Diarrhea, unspecified: Secondary | ICD-10-CM | POA: Diagnosis not present

## 2023-07-10 DIAGNOSIS — D631 Anemia in chronic kidney disease: Secondary | ICD-10-CM | POA: Diagnosis not present

## 2023-07-10 DIAGNOSIS — T7840XA Allergy, unspecified, initial encounter: Secondary | ICD-10-CM | POA: Diagnosis not present

## 2023-07-12 DIAGNOSIS — D689 Coagulation defect, unspecified: Secondary | ICD-10-CM | POA: Diagnosis not present

## 2023-07-12 DIAGNOSIS — N186 End stage renal disease: Secondary | ICD-10-CM | POA: Diagnosis not present

## 2023-07-12 DIAGNOSIS — T7840XA Allergy, unspecified, initial encounter: Secondary | ICD-10-CM | POA: Diagnosis not present

## 2023-07-12 DIAGNOSIS — R197 Diarrhea, unspecified: Secondary | ICD-10-CM | POA: Diagnosis not present

## 2023-07-12 DIAGNOSIS — D631 Anemia in chronic kidney disease: Secondary | ICD-10-CM | POA: Diagnosis not present

## 2023-07-12 DIAGNOSIS — D509 Iron deficiency anemia, unspecified: Secondary | ICD-10-CM | POA: Diagnosis not present

## 2023-07-12 DIAGNOSIS — R52 Pain, unspecified: Secondary | ICD-10-CM | POA: Diagnosis not present

## 2023-07-12 DIAGNOSIS — N2581 Secondary hyperparathyroidism of renal origin: Secondary | ICD-10-CM | POA: Diagnosis not present

## 2023-07-12 DIAGNOSIS — Z992 Dependence on renal dialysis: Secondary | ICD-10-CM | POA: Diagnosis not present

## 2023-07-14 DIAGNOSIS — N2581 Secondary hyperparathyroidism of renal origin: Secondary | ICD-10-CM | POA: Diagnosis not present

## 2023-07-14 DIAGNOSIS — D509 Iron deficiency anemia, unspecified: Secondary | ICD-10-CM | POA: Diagnosis not present

## 2023-07-14 DIAGNOSIS — R197 Diarrhea, unspecified: Secondary | ICD-10-CM | POA: Diagnosis not present

## 2023-07-14 DIAGNOSIS — R52 Pain, unspecified: Secondary | ICD-10-CM | POA: Diagnosis not present

## 2023-07-14 DIAGNOSIS — D631 Anemia in chronic kidney disease: Secondary | ICD-10-CM | POA: Diagnosis not present

## 2023-07-14 DIAGNOSIS — N186 End stage renal disease: Secondary | ICD-10-CM | POA: Diagnosis not present

## 2023-07-14 DIAGNOSIS — T7840XA Allergy, unspecified, initial encounter: Secondary | ICD-10-CM | POA: Diagnosis not present

## 2023-07-14 DIAGNOSIS — D689 Coagulation defect, unspecified: Secondary | ICD-10-CM | POA: Diagnosis not present

## 2023-07-14 DIAGNOSIS — Z992 Dependence on renal dialysis: Secondary | ICD-10-CM | POA: Diagnosis not present

## 2023-07-17 DIAGNOSIS — R52 Pain, unspecified: Secondary | ICD-10-CM | POA: Diagnosis not present

## 2023-07-17 DIAGNOSIS — D631 Anemia in chronic kidney disease: Secondary | ICD-10-CM | POA: Diagnosis not present

## 2023-07-17 DIAGNOSIS — N186 End stage renal disease: Secondary | ICD-10-CM | POA: Diagnosis not present

## 2023-07-17 DIAGNOSIS — T7840XA Allergy, unspecified, initial encounter: Secondary | ICD-10-CM | POA: Diagnosis not present

## 2023-07-17 DIAGNOSIS — R197 Diarrhea, unspecified: Secondary | ICD-10-CM | POA: Diagnosis not present

## 2023-07-17 DIAGNOSIS — D689 Coagulation defect, unspecified: Secondary | ICD-10-CM | POA: Diagnosis not present

## 2023-07-17 DIAGNOSIS — Z992 Dependence on renal dialysis: Secondary | ICD-10-CM | POA: Diagnosis not present

## 2023-07-17 DIAGNOSIS — N2581 Secondary hyperparathyroidism of renal origin: Secondary | ICD-10-CM | POA: Diagnosis not present

## 2023-07-17 DIAGNOSIS — D509 Iron deficiency anemia, unspecified: Secondary | ICD-10-CM | POA: Diagnosis not present

## 2023-07-19 DIAGNOSIS — R52 Pain, unspecified: Secondary | ICD-10-CM | POA: Diagnosis not present

## 2023-07-19 DIAGNOSIS — T7840XA Allergy, unspecified, initial encounter: Secondary | ICD-10-CM | POA: Diagnosis not present

## 2023-07-19 DIAGNOSIS — D689 Coagulation defect, unspecified: Secondary | ICD-10-CM | POA: Diagnosis not present

## 2023-07-19 DIAGNOSIS — R197 Diarrhea, unspecified: Secondary | ICD-10-CM | POA: Diagnosis not present

## 2023-07-19 DIAGNOSIS — N2581 Secondary hyperparathyroidism of renal origin: Secondary | ICD-10-CM | POA: Diagnosis not present

## 2023-07-19 DIAGNOSIS — N186 End stage renal disease: Secondary | ICD-10-CM | POA: Diagnosis not present

## 2023-07-19 DIAGNOSIS — D631 Anemia in chronic kidney disease: Secondary | ICD-10-CM | POA: Diagnosis not present

## 2023-07-19 DIAGNOSIS — D509 Iron deficiency anemia, unspecified: Secondary | ICD-10-CM | POA: Diagnosis not present

## 2023-07-19 DIAGNOSIS — Z992 Dependence on renal dialysis: Secondary | ICD-10-CM | POA: Diagnosis not present

## 2023-07-21 DIAGNOSIS — Z992 Dependence on renal dialysis: Secondary | ICD-10-CM | POA: Diagnosis not present

## 2023-07-21 DIAGNOSIS — N186 End stage renal disease: Secondary | ICD-10-CM | POA: Diagnosis not present

## 2023-07-21 DIAGNOSIS — D631 Anemia in chronic kidney disease: Secondary | ICD-10-CM | POA: Diagnosis not present

## 2023-07-21 DIAGNOSIS — D509 Iron deficiency anemia, unspecified: Secondary | ICD-10-CM | POA: Diagnosis not present

## 2023-07-21 DIAGNOSIS — T7840XA Allergy, unspecified, initial encounter: Secondary | ICD-10-CM | POA: Diagnosis not present

## 2023-07-21 DIAGNOSIS — R52 Pain, unspecified: Secondary | ICD-10-CM | POA: Diagnosis not present

## 2023-07-21 DIAGNOSIS — N2581 Secondary hyperparathyroidism of renal origin: Secondary | ICD-10-CM | POA: Diagnosis not present

## 2023-07-21 DIAGNOSIS — R197 Diarrhea, unspecified: Secondary | ICD-10-CM | POA: Diagnosis not present

## 2023-07-21 DIAGNOSIS — D689 Coagulation defect, unspecified: Secondary | ICD-10-CM | POA: Diagnosis not present

## 2023-07-24 DIAGNOSIS — D689 Coagulation defect, unspecified: Secondary | ICD-10-CM | POA: Diagnosis not present

## 2023-07-24 DIAGNOSIS — N2581 Secondary hyperparathyroidism of renal origin: Secondary | ICD-10-CM | POA: Diagnosis not present

## 2023-07-24 DIAGNOSIS — R197 Diarrhea, unspecified: Secondary | ICD-10-CM | POA: Diagnosis not present

## 2023-07-24 DIAGNOSIS — Z992 Dependence on renal dialysis: Secondary | ICD-10-CM | POA: Diagnosis not present

## 2023-07-24 DIAGNOSIS — T7840XA Allergy, unspecified, initial encounter: Secondary | ICD-10-CM | POA: Diagnosis not present

## 2023-07-24 DIAGNOSIS — D509 Iron deficiency anemia, unspecified: Secondary | ICD-10-CM | POA: Diagnosis not present

## 2023-07-24 DIAGNOSIS — R52 Pain, unspecified: Secondary | ICD-10-CM | POA: Diagnosis not present

## 2023-07-24 DIAGNOSIS — D631 Anemia in chronic kidney disease: Secondary | ICD-10-CM | POA: Diagnosis not present

## 2023-07-24 DIAGNOSIS — N186 End stage renal disease: Secondary | ICD-10-CM | POA: Diagnosis not present

## 2023-07-26 DIAGNOSIS — Z992 Dependence on renal dialysis: Secondary | ICD-10-CM | POA: Diagnosis not present

## 2023-07-26 DIAGNOSIS — D509 Iron deficiency anemia, unspecified: Secondary | ICD-10-CM | POA: Diagnosis not present

## 2023-07-26 DIAGNOSIS — N2581 Secondary hyperparathyroidism of renal origin: Secondary | ICD-10-CM | POA: Diagnosis not present

## 2023-07-26 DIAGNOSIS — N186 End stage renal disease: Secondary | ICD-10-CM | POA: Diagnosis not present

## 2023-07-26 DIAGNOSIS — D689 Coagulation defect, unspecified: Secondary | ICD-10-CM | POA: Diagnosis not present

## 2023-07-26 DIAGNOSIS — R197 Diarrhea, unspecified: Secondary | ICD-10-CM | POA: Diagnosis not present

## 2023-07-26 DIAGNOSIS — R52 Pain, unspecified: Secondary | ICD-10-CM | POA: Diagnosis not present

## 2023-07-26 DIAGNOSIS — D631 Anemia in chronic kidney disease: Secondary | ICD-10-CM | POA: Diagnosis not present

## 2023-07-26 DIAGNOSIS — T7840XA Allergy, unspecified, initial encounter: Secondary | ICD-10-CM | POA: Diagnosis not present

## 2023-07-27 DIAGNOSIS — N186 End stage renal disease: Secondary | ICD-10-CM | POA: Diagnosis not present

## 2023-07-27 DIAGNOSIS — N130 Hydronephrosis with ureteropelvic junction obstruction: Secondary | ICD-10-CM | POA: Diagnosis not present

## 2023-07-27 DIAGNOSIS — C7951 Secondary malignant neoplasm of bone: Secondary | ICD-10-CM | POA: Diagnosis not present

## 2023-07-28 DIAGNOSIS — N2581 Secondary hyperparathyroidism of renal origin: Secondary | ICD-10-CM | POA: Diagnosis not present

## 2023-07-28 DIAGNOSIS — D689 Coagulation defect, unspecified: Secondary | ICD-10-CM | POA: Diagnosis not present

## 2023-07-28 DIAGNOSIS — T7840XA Allergy, unspecified, initial encounter: Secondary | ICD-10-CM | POA: Diagnosis not present

## 2023-07-28 DIAGNOSIS — R52 Pain, unspecified: Secondary | ICD-10-CM | POA: Diagnosis not present

## 2023-07-28 DIAGNOSIS — D509 Iron deficiency anemia, unspecified: Secondary | ICD-10-CM | POA: Diagnosis not present

## 2023-07-28 DIAGNOSIS — R197 Diarrhea, unspecified: Secondary | ICD-10-CM | POA: Diagnosis not present

## 2023-07-28 DIAGNOSIS — Z992 Dependence on renal dialysis: Secondary | ICD-10-CM | POA: Diagnosis not present

## 2023-07-28 DIAGNOSIS — D631 Anemia in chronic kidney disease: Secondary | ICD-10-CM | POA: Diagnosis not present

## 2023-07-28 DIAGNOSIS — N186 End stage renal disease: Secondary | ICD-10-CM | POA: Diagnosis not present

## 2023-07-31 DIAGNOSIS — N2581 Secondary hyperparathyroidism of renal origin: Secondary | ICD-10-CM | POA: Diagnosis not present

## 2023-07-31 DIAGNOSIS — D689 Coagulation defect, unspecified: Secondary | ICD-10-CM | POA: Diagnosis not present

## 2023-07-31 DIAGNOSIS — T7840XA Allergy, unspecified, initial encounter: Secondary | ICD-10-CM | POA: Diagnosis not present

## 2023-07-31 DIAGNOSIS — N186 End stage renal disease: Secondary | ICD-10-CM | POA: Diagnosis not present

## 2023-07-31 DIAGNOSIS — D631 Anemia in chronic kidney disease: Secondary | ICD-10-CM | POA: Diagnosis not present

## 2023-07-31 DIAGNOSIS — R197 Diarrhea, unspecified: Secondary | ICD-10-CM | POA: Diagnosis not present

## 2023-07-31 DIAGNOSIS — R52 Pain, unspecified: Secondary | ICD-10-CM | POA: Diagnosis not present

## 2023-07-31 DIAGNOSIS — Z992 Dependence on renal dialysis: Secondary | ICD-10-CM | POA: Diagnosis not present

## 2023-07-31 DIAGNOSIS — D509 Iron deficiency anemia, unspecified: Secondary | ICD-10-CM | POA: Diagnosis not present

## 2023-08-02 DIAGNOSIS — N2581 Secondary hyperparathyroidism of renal origin: Secondary | ICD-10-CM | POA: Diagnosis not present

## 2023-08-02 DIAGNOSIS — Z992 Dependence on renal dialysis: Secondary | ICD-10-CM | POA: Diagnosis not present

## 2023-08-02 DIAGNOSIS — N186 End stage renal disease: Secondary | ICD-10-CM | POA: Diagnosis not present

## 2023-08-02 DIAGNOSIS — D631 Anemia in chronic kidney disease: Secondary | ICD-10-CM | POA: Diagnosis not present

## 2023-08-02 DIAGNOSIS — R197 Diarrhea, unspecified: Secondary | ICD-10-CM | POA: Diagnosis not present

## 2023-08-02 DIAGNOSIS — T7840XA Allergy, unspecified, initial encounter: Secondary | ICD-10-CM | POA: Diagnosis not present

## 2023-08-02 DIAGNOSIS — R52 Pain, unspecified: Secondary | ICD-10-CM | POA: Diagnosis not present

## 2023-08-02 DIAGNOSIS — D689 Coagulation defect, unspecified: Secondary | ICD-10-CM | POA: Diagnosis not present

## 2023-08-02 DIAGNOSIS — D509 Iron deficiency anemia, unspecified: Secondary | ICD-10-CM | POA: Diagnosis not present

## 2023-08-04 DIAGNOSIS — Z992 Dependence on renal dialysis: Secondary | ICD-10-CM | POA: Diagnosis not present

## 2023-08-04 DIAGNOSIS — D689 Coagulation defect, unspecified: Secondary | ICD-10-CM | POA: Diagnosis not present

## 2023-08-04 DIAGNOSIS — N138 Other obstructive and reflux uropathy: Secondary | ICD-10-CM | POA: Diagnosis not present

## 2023-08-04 DIAGNOSIS — D631 Anemia in chronic kidney disease: Secondary | ICD-10-CM | POA: Diagnosis not present

## 2023-08-04 DIAGNOSIS — T7840XA Allergy, unspecified, initial encounter: Secondary | ICD-10-CM | POA: Diagnosis not present

## 2023-08-04 DIAGNOSIS — D509 Iron deficiency anemia, unspecified: Secondary | ICD-10-CM | POA: Diagnosis not present

## 2023-08-04 DIAGNOSIS — R197 Diarrhea, unspecified: Secondary | ICD-10-CM | POA: Diagnosis not present

## 2023-08-04 DIAGNOSIS — R52 Pain, unspecified: Secondary | ICD-10-CM | POA: Diagnosis not present

## 2023-08-04 DIAGNOSIS — N186 End stage renal disease: Secondary | ICD-10-CM | POA: Diagnosis not present

## 2023-08-04 DIAGNOSIS — N2581 Secondary hyperparathyroidism of renal origin: Secondary | ICD-10-CM | POA: Diagnosis not present

## 2023-08-07 DIAGNOSIS — N2581 Secondary hyperparathyroidism of renal origin: Secondary | ICD-10-CM | POA: Diagnosis not present

## 2023-08-07 DIAGNOSIS — Z992 Dependence on renal dialysis: Secondary | ICD-10-CM | POA: Diagnosis not present

## 2023-08-07 DIAGNOSIS — N186 End stage renal disease: Secondary | ICD-10-CM | POA: Diagnosis not present

## 2023-08-07 DIAGNOSIS — T7840XA Allergy, unspecified, initial encounter: Secondary | ICD-10-CM | POA: Diagnosis not present

## 2023-08-07 DIAGNOSIS — D509 Iron deficiency anemia, unspecified: Secondary | ICD-10-CM | POA: Diagnosis not present

## 2023-08-07 DIAGNOSIS — R52 Pain, unspecified: Secondary | ICD-10-CM | POA: Diagnosis not present

## 2023-08-07 DIAGNOSIS — D631 Anemia in chronic kidney disease: Secondary | ICD-10-CM | POA: Diagnosis not present

## 2023-08-07 DIAGNOSIS — D689 Coagulation defect, unspecified: Secondary | ICD-10-CM | POA: Diagnosis not present

## 2023-08-08 DIAGNOSIS — D689 Coagulation defect, unspecified: Secondary | ICD-10-CM | POA: Diagnosis not present

## 2023-08-08 DIAGNOSIS — N186 End stage renal disease: Secondary | ICD-10-CM | POA: Diagnosis not present

## 2023-08-08 DIAGNOSIS — D631 Anemia in chronic kidney disease: Secondary | ICD-10-CM | POA: Diagnosis not present

## 2023-08-08 DIAGNOSIS — R52 Pain, unspecified: Secondary | ICD-10-CM | POA: Diagnosis not present

## 2023-08-08 DIAGNOSIS — T7840XA Allergy, unspecified, initial encounter: Secondary | ICD-10-CM | POA: Diagnosis not present

## 2023-08-08 DIAGNOSIS — D509 Iron deficiency anemia, unspecified: Secondary | ICD-10-CM | POA: Diagnosis not present

## 2023-08-08 DIAGNOSIS — N2581 Secondary hyperparathyroidism of renal origin: Secondary | ICD-10-CM | POA: Diagnosis not present

## 2023-08-08 DIAGNOSIS — Z992 Dependence on renal dialysis: Secondary | ICD-10-CM | POA: Diagnosis not present

## 2023-08-09 ENCOUNTER — Other Ambulatory Visit: Payer: Self-pay | Admitting: Family Medicine

## 2023-08-09 DIAGNOSIS — Z992 Dependence on renal dialysis: Secondary | ICD-10-CM | POA: Diagnosis not present

## 2023-08-09 DIAGNOSIS — N186 End stage renal disease: Secondary | ICD-10-CM | POA: Diagnosis not present

## 2023-08-09 DIAGNOSIS — N2581 Secondary hyperparathyroidism of renal origin: Secondary | ICD-10-CM | POA: Diagnosis not present

## 2023-08-09 DIAGNOSIS — D689 Coagulation defect, unspecified: Secondary | ICD-10-CM | POA: Diagnosis not present

## 2023-08-09 DIAGNOSIS — D631 Anemia in chronic kidney disease: Secondary | ICD-10-CM | POA: Diagnosis not present

## 2023-08-09 DIAGNOSIS — T7840XA Allergy, unspecified, initial encounter: Secondary | ICD-10-CM | POA: Diagnosis not present

## 2023-08-09 DIAGNOSIS — R52 Pain, unspecified: Secondary | ICD-10-CM | POA: Diagnosis not present

## 2023-08-09 DIAGNOSIS — D509 Iron deficiency anemia, unspecified: Secondary | ICD-10-CM | POA: Diagnosis not present

## 2023-08-09 MED ORDER — AMLODIPINE BESYLATE 10 MG PO TABS: 10.0000 mg | ORAL_TABLET | Freq: Every day | ORAL | 0 refills | Status: AC

## 2023-08-11 DIAGNOSIS — Z992 Dependence on renal dialysis: Secondary | ICD-10-CM | POA: Diagnosis not present

## 2023-08-11 DIAGNOSIS — D631 Anemia in chronic kidney disease: Secondary | ICD-10-CM | POA: Diagnosis not present

## 2023-08-11 DIAGNOSIS — N186 End stage renal disease: Secondary | ICD-10-CM | POA: Diagnosis not present

## 2023-08-11 DIAGNOSIS — N2581 Secondary hyperparathyroidism of renal origin: Secondary | ICD-10-CM | POA: Diagnosis not present

## 2023-08-11 DIAGNOSIS — R52 Pain, unspecified: Secondary | ICD-10-CM | POA: Diagnosis not present

## 2023-08-11 DIAGNOSIS — D689 Coagulation defect, unspecified: Secondary | ICD-10-CM | POA: Diagnosis not present

## 2023-08-11 DIAGNOSIS — D509 Iron deficiency anemia, unspecified: Secondary | ICD-10-CM | POA: Diagnosis not present

## 2023-08-11 DIAGNOSIS — T7840XA Allergy, unspecified, initial encounter: Secondary | ICD-10-CM | POA: Diagnosis not present

## 2023-08-14 DIAGNOSIS — Z992 Dependence on renal dialysis: Secondary | ICD-10-CM | POA: Diagnosis not present

## 2023-08-14 DIAGNOSIS — T7840XA Allergy, unspecified, initial encounter: Secondary | ICD-10-CM | POA: Diagnosis not present

## 2023-08-14 DIAGNOSIS — D509 Iron deficiency anemia, unspecified: Secondary | ICD-10-CM | POA: Diagnosis not present

## 2023-08-14 DIAGNOSIS — R52 Pain, unspecified: Secondary | ICD-10-CM | POA: Diagnosis not present

## 2023-08-14 DIAGNOSIS — N2581 Secondary hyperparathyroidism of renal origin: Secondary | ICD-10-CM | POA: Diagnosis not present

## 2023-08-14 DIAGNOSIS — N186 End stage renal disease: Secondary | ICD-10-CM | POA: Diagnosis not present

## 2023-08-14 DIAGNOSIS — D689 Coagulation defect, unspecified: Secondary | ICD-10-CM | POA: Diagnosis not present

## 2023-08-14 DIAGNOSIS — D631 Anemia in chronic kidney disease: Secondary | ICD-10-CM | POA: Diagnosis not present

## 2023-08-16 DIAGNOSIS — D689 Coagulation defect, unspecified: Secondary | ICD-10-CM | POA: Diagnosis not present

## 2023-08-16 DIAGNOSIS — R52 Pain, unspecified: Secondary | ICD-10-CM | POA: Diagnosis not present

## 2023-08-16 DIAGNOSIS — D631 Anemia in chronic kidney disease: Secondary | ICD-10-CM | POA: Diagnosis not present

## 2023-08-16 DIAGNOSIS — T7840XA Allergy, unspecified, initial encounter: Secondary | ICD-10-CM | POA: Diagnosis not present

## 2023-08-16 DIAGNOSIS — D509 Iron deficiency anemia, unspecified: Secondary | ICD-10-CM | POA: Diagnosis not present

## 2023-08-16 DIAGNOSIS — N2581 Secondary hyperparathyroidism of renal origin: Secondary | ICD-10-CM | POA: Diagnosis not present

## 2023-08-16 DIAGNOSIS — N186 End stage renal disease: Secondary | ICD-10-CM | POA: Diagnosis not present

## 2023-08-16 DIAGNOSIS — Z992 Dependence on renal dialysis: Secondary | ICD-10-CM | POA: Diagnosis not present

## 2023-08-18 DIAGNOSIS — D631 Anemia in chronic kidney disease: Secondary | ICD-10-CM | POA: Diagnosis not present

## 2023-08-18 DIAGNOSIS — D509 Iron deficiency anemia, unspecified: Secondary | ICD-10-CM | POA: Diagnosis not present

## 2023-08-18 DIAGNOSIS — R52 Pain, unspecified: Secondary | ICD-10-CM | POA: Diagnosis not present

## 2023-08-18 DIAGNOSIS — N2581 Secondary hyperparathyroidism of renal origin: Secondary | ICD-10-CM | POA: Diagnosis not present

## 2023-08-18 DIAGNOSIS — Z992 Dependence on renal dialysis: Secondary | ICD-10-CM | POA: Diagnosis not present

## 2023-08-18 DIAGNOSIS — D689 Coagulation defect, unspecified: Secondary | ICD-10-CM | POA: Diagnosis not present

## 2023-08-18 DIAGNOSIS — T7840XA Allergy, unspecified, initial encounter: Secondary | ICD-10-CM | POA: Diagnosis not present

## 2023-08-18 DIAGNOSIS — N186 End stage renal disease: Secondary | ICD-10-CM | POA: Diagnosis not present

## 2023-08-19 NOTE — Progress Notes (Deleted)
 Cardiology Office Note:  .   Date:  08/19/2023  ID:  Devon Cook, DOB 25-Jun-1961, MRN [id number] PCP: Ronnald Nian, MD  [facility] Providers Cardiologist:  Meriam Sprague, MD (Inactive)    Patient Profile: .      PMH: ESRD on HD M/W/F Renovascular hypertension Anemia Prostate cancer OSA Hyperlipidemia  Referred to cardiology and seen by Dr. Shari Prows 04/10/2020 for preoperative evaluation prior to bilateral ureteral stent replacement.  ED visit 10/05/2019 with palpitations that developed at HD. Thought to have SVT versus a flutter. Per Dr. Shari Prows, strips look like atrial flutter.  CHA2DS2-VASc score of 1 at that time, therefore no anticoagulation was started.  He had good functional capacity and able to perform >4 METS without cardiac issues.  Echocardiogram completed 04/27/2020 which revealed mildly reduced LVEF 45 to 50%, moderately elevated pulmonary pressures, and mildly dilated aorta.  He called our office 12/17/2021 to report palpitations and HR 130 bpm at HD.  He had resumed previously discontinued metoprolol on his own.  He was scheduled for follow-up with Dr. Shari Prows, however there is no record of return cardiology visit.  ED visit 01/14/2023 for complaints of chest pain.  He reported he felt generally unwell after dialysis the day prior and had noticed elevated blood pressures throughout the night.  EKG was unremarkable.Hs trops elevated at 88 >> 81 but consistent with previous findings 6 months prior.   Seen in clinic by me on 02/13/23 for elevated BP. Antihypertensive medications recently adjusted by nephrology and PCP.  Scheduled for lab work with PCP later today, according to notes includes TSH, renin, aldosterone, and cortisol. Reports improvement in BP with increase of hydralazine to 100 mg TID and clonidine 0.1 mg for SBP > 180 mmHg. Occasional chest pain. At time of ED visit 7/13, felt like someone was standing on his chest. This occurs at various times  and is sometimes accompanied by shortness of breath and nausea, no diaphoresis. Goes up and down 2 flights of stairs into and out of his home, as well as occasionally hitting golf balls with no worsening of symptoms. Is unsure of last time symptoms occurred. Was told today at dialysis that they are going to reduce dry weight. No mention of irregular HR, no palpitations. He denies shortness of breath, orthopnea, edema, or PND.    He had a telephone evaluation for preoperative cardiac evaluation on 06/20/2023 by Bernadene Person, NP and was doing well at that time.        History of Present Illness: Devon Cook is a very pleasant 63 y.o. male who is here today   ROS: See HPI       Studies Reviewed: .         Risk Assessment/Calculations:    CHA2DS2-VASc Score = 2   This indicates a 2.2% annual risk of stroke. The patient's score is based upon: CHF History: 1 HTN History: 1 Diabetes History: 0 Stroke History: 0 Vascular Disease History: 0 Age Score: 0 Gender Score: 0   {This patient has a significant risk of stroke if diagnosed with atrial fibrillation.  Please consider VKA or DOAC agent for anticoagulation if the bleeding risk is acceptable.   You can also use the SmartPhrase .[email or web address] for documentation.   :[id number]} No BP recorded.  {Refresh Note OR Click here to enter BP  :1}***       Physical Exam:   VS:  There were no vitals taken for this visit.  Wt Readings from Last 3 Encounters:  06/23/23 278 lb (126.1 kg)  06/15/23 175 lb (79.4 kg)  02/13/23 277 lb (125.6 kg)    GEN: Well nourished, well developed in no acute distress NECK: No JVD; No carotid bruits CARDIAC: RRR, no murmurs, rubs, gallops RESPIRATORY:  Clear to auscultation without rales, wheezing or rhonchi  ABDOMEN: Soft, non-tender, non-distended EXTREMITIES:  No edema; No deformity     ASSESSMENT AND PLAN: .    Chest pain: Occasional episodes of chest pain most often during dialysis and at rest.  These are sometimes associated with nausea and shortness of breath. He denies worsening symptoms with exertion. Goes up and down 2 flights of steps daily into his home, no worsening symptoms consistent with angina. Symptoms may occur in the setting of very high blood pressure, episode on 7/13 in ED involved BP > 170. We discussed considering ischemia evaluation in the future if symptoms persist or worsen. He will call back to report.   Aortic dilatation: Mild dilatation of the ascending aorta measuring 42 mm on echo 04/27/2020.  He has not maintained consistent follow-up.  Recommended repeat echocardiogram for evaluation.  He will consider echo and call back to schedule.   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 He has not maintained consistent follow-up. He reports shortness of breath occasionally, not persistent. No dyspnea, orthopnea, PND, or edema. Appears euvolemic on exam. GDMT limited by ESRD.  We will continue hydralazine and metoprolol.  PAF: Per record review, atrial flutter noted on rhythm strips in 2021.  OAC not started at that time due to CHA2DS2-VASc score of 1.  No evidence of return of A-fib on recent EKGs and no tachycardia or palpitations per patient.  CHA2DS2-VASc score is now 2.  Encouraged patient to notify us if he has evidence of irregular heart rate.  Hypertension: BP has been significantly elevated recently. BP is elevated in clinic today. Antihypertensive medications were adjusted by PCP.  He reports improvement in BP since that time. We will continue medications at current doses. Encouraged him to keep follow-up for labs and management of HTN with PCP and nephrology.         Disposition: ***  Signed, Eligha Bridegroom, NP-C

## 2023-08-21 DIAGNOSIS — T7840XA Allergy, unspecified, initial encounter: Secondary | ICD-10-CM | POA: Diagnosis not present

## 2023-08-21 DIAGNOSIS — D689 Coagulation defect, unspecified: Secondary | ICD-10-CM | POA: Diagnosis not present

## 2023-08-21 DIAGNOSIS — D631 Anemia in chronic kidney disease: Secondary | ICD-10-CM | POA: Diagnosis not present

## 2023-08-21 DIAGNOSIS — N186 End stage renal disease: Secondary | ICD-10-CM | POA: Diagnosis not present

## 2023-08-21 DIAGNOSIS — Z992 Dependence on renal dialysis: Secondary | ICD-10-CM | POA: Diagnosis not present

## 2023-08-21 DIAGNOSIS — D509 Iron deficiency anemia, unspecified: Secondary | ICD-10-CM | POA: Diagnosis not present

## 2023-08-21 DIAGNOSIS — R52 Pain, unspecified: Secondary | ICD-10-CM | POA: Diagnosis not present

## 2023-08-21 DIAGNOSIS — N2581 Secondary hyperparathyroidism of renal origin: Secondary | ICD-10-CM | POA: Diagnosis not present

## 2023-08-22 ENCOUNTER — Ambulatory Visit: Payer: Medicare Other | Admitting: Nurse Practitioner

## 2023-08-22 ENCOUNTER — Ambulatory Visit
Admission: RE | Admit: 2023-08-22 | Discharge: 2023-08-22 | Disposition: A | Discharge status: Home / Self Care | Payer: Medicare Other | Source: Ambulatory Visit | Attending: Family Medicine | Admitting: Family Medicine

## 2023-08-22 ENCOUNTER — Ambulatory Visit (INDEPENDENT_AMBULATORY_CARE_PROVIDER_SITE_OTHER): Payer: Medicare Other | Admitting: Family Medicine

## 2023-08-22 ENCOUNTER — Encounter: Payer: Self-pay | Admitting: Family Medicine

## 2023-08-22 VITALS — BP 150/88 | HR 80 | Temp 98.7°F | Ht 74.0 in | Wt 289.4 lb

## 2023-08-22 DIAGNOSIS — R52 Pain, unspecified: Secondary | ICD-10-CM

## 2023-08-22 DIAGNOSIS — R058 Other specified cough: Secondary | ICD-10-CM | POA: Diagnosis not present

## 2023-08-22 DIAGNOSIS — Z23 Encounter for immunization: Secondary | ICD-10-CM

## 2023-08-22 DIAGNOSIS — M9901 Segmental and somatic dysfunction of cervical region: Secondary | ICD-10-CM | POA: Diagnosis not present

## 2023-08-22 DIAGNOSIS — M542 Cervicalgia: Secondary | ICD-10-CM

## 2023-08-22 DIAGNOSIS — H9201 Otalgia, right ear: Secondary | ICD-10-CM

## 2023-08-22 DIAGNOSIS — M62830 Muscle spasm of back: Secondary | ICD-10-CM | POA: Diagnosis not present

## 2023-08-22 LAB — POCT INFLUENZA A/B
Influenza A, POC: NEGATIVE
Influenza B, POC: NEGATIVE

## 2023-08-22 LAB — POC COVID19 BINAXNOW: SARS Coronavirus 2 Ag: NEGATIVE

## 2023-08-22 NOTE — Addendum Note (Signed)
Addended by: Ronnald Nian on: 08/22/2023 12:35 PM   Modules accepted: Level of Service

## 2023-08-22 NOTE — Progress Notes (Signed)
   Subjective:    Patient ID: Devon Cook, male    DOB: [birth year], 63 y.o.   MRN: [id number]  HPI He states that about a week ago he sneezed and then noted some right neck and posterior ear discomfort and also some itching in that area as well as a slight sore throat.  He also complained to the staff of a dry throat and some ear discomfort.  No numbness, tingling or weakness.  He does state that the neck discomfort gets worse with motion of the neck.   Review of Systems     Objective:    Physical Exam Alert and complaining of neck pain especially when he tilts his head to the right or rotates his neck.  No numbness tingling or weakness.  DTRs normal.  TMs are clear.  Throat is clear.  Neck is supple without adenopathy or thyromegaly.       Assessment & Plan:  Other cough - Plan: POC COVID-19, Influenza A/B  Right ear pain - Plan: POC COVID-19, Influenza A/B  Body aches - Plan: POC COVID-19, Influenza A/B  Need for influenza vaccination - Plan: CANCELED: Flu Vaccine Trivalent High Dose (Fluad)  Neck pain - Plan: Ambulatory referral to Chiropractic, DG Cervical Spine Complete I will do an x-ray to make sure I do not see anything orthopedically and then I think chiropractic manipulation would be beneficial especially since he states that the sneezing was the initial insult with no evidence of nerve root impingement.  He does have pain medicine at home and I encouraged him to go ahead and use the oxycodone that he already has prescribed.

## 2023-08-23 DIAGNOSIS — T7840XA Allergy, unspecified, initial encounter: Secondary | ICD-10-CM | POA: Diagnosis not present

## 2023-08-23 DIAGNOSIS — Z992 Dependence on renal dialysis: Secondary | ICD-10-CM | POA: Diagnosis not present

## 2023-08-23 DIAGNOSIS — N186 End stage renal disease: Secondary | ICD-10-CM | POA: Diagnosis not present

## 2023-08-23 DIAGNOSIS — D689 Coagulation defect, unspecified: Secondary | ICD-10-CM | POA: Diagnosis not present

## 2023-08-23 DIAGNOSIS — D631 Anemia in chronic kidney disease: Secondary | ICD-10-CM | POA: Diagnosis not present

## 2023-08-23 DIAGNOSIS — D509 Iron deficiency anemia, unspecified: Secondary | ICD-10-CM | POA: Diagnosis not present

## 2023-08-23 DIAGNOSIS — N2581 Secondary hyperparathyroidism of renal origin: Secondary | ICD-10-CM | POA: Diagnosis not present

## 2023-08-23 DIAGNOSIS — R52 Pain, unspecified: Secondary | ICD-10-CM | POA: Diagnosis not present

## 2023-08-25 DIAGNOSIS — D509 Iron deficiency anemia, unspecified: Secondary | ICD-10-CM | POA: Diagnosis not present

## 2023-08-25 DIAGNOSIS — T7840XA Allergy, unspecified, initial encounter: Secondary | ICD-10-CM | POA: Diagnosis not present

## 2023-08-25 DIAGNOSIS — N186 End stage renal disease: Secondary | ICD-10-CM | POA: Diagnosis not present

## 2023-08-25 DIAGNOSIS — D689 Coagulation defect, unspecified: Secondary | ICD-10-CM | POA: Diagnosis not present

## 2023-08-25 DIAGNOSIS — R52 Pain, unspecified: Secondary | ICD-10-CM | POA: Diagnosis not present

## 2023-08-25 DIAGNOSIS — N2581 Secondary hyperparathyroidism of renal origin: Secondary | ICD-10-CM | POA: Diagnosis not present

## 2023-08-25 DIAGNOSIS — Z992 Dependence on renal dialysis: Secondary | ICD-10-CM | POA: Diagnosis not present

## 2023-08-25 DIAGNOSIS — D631 Anemia in chronic kidney disease: Secondary | ICD-10-CM | POA: Diagnosis not present

## 2023-08-28 DIAGNOSIS — D689 Coagulation defect, unspecified: Secondary | ICD-10-CM | POA: Diagnosis not present

## 2023-08-28 DIAGNOSIS — N186 End stage renal disease: Secondary | ICD-10-CM | POA: Diagnosis not present

## 2023-08-28 DIAGNOSIS — Z992 Dependence on renal dialysis: Secondary | ICD-10-CM | POA: Diagnosis not present

## 2023-08-28 DIAGNOSIS — D631 Anemia in chronic kidney disease: Secondary | ICD-10-CM | POA: Diagnosis not present

## 2023-08-28 DIAGNOSIS — N2581 Secondary hyperparathyroidism of renal origin: Secondary | ICD-10-CM | POA: Diagnosis not present

## 2023-08-28 DIAGNOSIS — T7840XA Allergy, unspecified, initial encounter: Secondary | ICD-10-CM | POA: Diagnosis not present

## 2023-08-28 DIAGNOSIS — D509 Iron deficiency anemia, unspecified: Secondary | ICD-10-CM | POA: Diagnosis not present

## 2023-08-28 DIAGNOSIS — R52 Pain, unspecified: Secondary | ICD-10-CM | POA: Diagnosis not present

## 2023-08-28 NOTE — Progress Notes (Deleted)
 Cardiology Office Note:  .   Date:  08/28/2023  ID:  Devon Cook, DOB [birth year], MRN [id number] PCP: Devon Nian, MD  [facility] Providers Cardiologist:  Meriam Sprague, MD (Inactive) { Click to update primary MD,subspecialty MD or APP then REFRESH:1}   History of Present Illness: Devon Cook is a 63 y.o. male  with a h/o chest pain, paroxysmal atrial fibrillation, HFmrEF, aortic dilation, hypertension, hyperlipidemia ESRD on HD, OSA, anemia, and prostate cancer   hought to have SVT versus a flutter. Per Dr. Shari Prows, strips look like atrial flutter.  CHA2DS2-VASc score of 1 at that time, therefore no anticoagulation was started.  He had good functional capacity and able to perform >4 METS without cardiac issues.  Echocardiogram completed 04/27/2020 which revealed mildly reduced LVEF 45 to 50%, moderately elevated pulmonary pressures, and mildly dilated aorta.   ROS: ***  Studies Reviewed: Marland Kitchen         Prior CV Studies: {Select studies to display:26339}  ***  Risk Assessment/Calculations:   {Does this patient have ATRIAL FIBRILLATION?[phone]} No BP recorded.  {Refresh Note OR Click here to enter BP  :1}***       Physical Exam:   VS:  There were no vitals taken for this visit.   Wt Readings from Last 3 Encounters:  08/22/23 289 lb 6.4 oz (131.3 kg)  06/23/23 278 lb (126.1 kg)  06/15/23 175 lb (79.4 kg)    GEN: Well nourished, well developed in no acute distress NECK: No JVD; No carotid bruits CARDIAC: ***RRR, no murmurs, rubs, gallops RESPIRATORY:  Clear to auscultation without rales, wheezing or rhonchi  ABDOMEN: Soft, non-tender, non-distended EXTREMITIES:  No edema; No deformity   ASSESSMENT AND PLAN: .    Chest pain: Occasional episodes of chest pain most often during dialysis and at rest. These are sometimes associated with nausea and shortness of breath. He denies worsening symptoms with exertion. Goes up and down 2 flights of steps  daily into his home, no worsening symptoms consistent with angina. Symptoms may occur in the setting of very high blood pressure, episode on 7/13 in ED involved BP > 170. We discussed considering ischemia evaluation in the future if symptoms persist or worsen. He will call back to report.   Aortic dilatation: Mild dilatation of the ascending aorta measuring 42 mm on echo 04/27/2020.  He has not maintained consistent follow-up.  Recommended repeat echocardiogram for evaluation.  He will consider echo and call back to schedule.   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 He has not maintained consistent follow-up. He reports shortness of breath occasionally, not persistent. No dyspnea, orthopnea, PND, or edema. Appears euvolemic on exam. GDMT limited by ESRD.  We will continue hydralazine and metoprolol.  PAF: Per record review, atrial flutter noted on rhythm strips in 2021.  OAC not started at that time due to CHA2DS2-VASc score of 1.  No evidence of return of A-fib on recent EKGs and no tachycardia or palpitations per patient.  CHA2DS2-VASc score is now 2.  Encouraged patient to notify us if he has evidence of irregular heart rate.  Hypertension: BP has been significantly elevated recently. BP is elevated in clinic today. Antihypertensive medications were adjusted by PCP.  He reports improvement in BP since that time. We will continue medications at current doses. Encouraged him to keep follow-up for labs and management of HTN with PCP and nephrology.      {  Are you ordering a CV Procedure (e.g. stress test, cath, DCCV, TEE, etc)?   Press F2        :[id number]}  Dispo: ***  Signed, Jacolyn Reedy, PA-C

## 2023-08-29 ENCOUNTER — Encounter: Payer: Self-pay | Admitting: Family Medicine

## 2023-08-29 ENCOUNTER — Encounter: Payer: Self-pay | Admitting: Internal Medicine

## 2023-08-30 DIAGNOSIS — N186 End stage renal disease: Secondary | ICD-10-CM | POA: Diagnosis not present

## 2023-08-30 DIAGNOSIS — D689 Coagulation defect, unspecified: Secondary | ICD-10-CM | POA: Diagnosis not present

## 2023-08-30 DIAGNOSIS — N2581 Secondary hyperparathyroidism of renal origin: Secondary | ICD-10-CM | POA: Diagnosis not present

## 2023-08-30 DIAGNOSIS — R52 Pain, unspecified: Secondary | ICD-10-CM | POA: Diagnosis not present

## 2023-08-30 DIAGNOSIS — D631 Anemia in chronic kidney disease: Secondary | ICD-10-CM | POA: Diagnosis not present

## 2023-08-30 DIAGNOSIS — Z992 Dependence on renal dialysis: Secondary | ICD-10-CM | POA: Diagnosis not present

## 2023-08-30 DIAGNOSIS — T7840XA Allergy, unspecified, initial encounter: Secondary | ICD-10-CM | POA: Diagnosis not present

## 2023-08-30 DIAGNOSIS — D509 Iron deficiency anemia, unspecified: Secondary | ICD-10-CM | POA: Diagnosis not present

## 2023-09-01 DIAGNOSIS — N186 End stage renal disease: Secondary | ICD-10-CM | POA: Diagnosis not present

## 2023-09-01 DIAGNOSIS — D509 Iron deficiency anemia, unspecified: Secondary | ICD-10-CM | POA: Diagnosis not present

## 2023-09-01 DIAGNOSIS — T7840XA Allergy, unspecified, initial encounter: Secondary | ICD-10-CM | POA: Diagnosis not present

## 2023-09-01 DIAGNOSIS — Z992 Dependence on renal dialysis: Secondary | ICD-10-CM | POA: Diagnosis not present

## 2023-09-01 DIAGNOSIS — D631 Anemia in chronic kidney disease: Secondary | ICD-10-CM | POA: Diagnosis not present

## 2023-09-01 DIAGNOSIS — R52 Pain, unspecified: Secondary | ICD-10-CM | POA: Diagnosis not present

## 2023-09-01 DIAGNOSIS — N138 Other obstructive and reflux uropathy: Secondary | ICD-10-CM | POA: Diagnosis not present

## 2023-09-01 DIAGNOSIS — D689 Coagulation defect, unspecified: Secondary | ICD-10-CM | POA: Diagnosis not present

## 2023-09-01 DIAGNOSIS — N2581 Secondary hyperparathyroidism of renal origin: Secondary | ICD-10-CM | POA: Diagnosis not present

## 2023-09-04 DIAGNOSIS — D689 Coagulation defect, unspecified: Secondary | ICD-10-CM | POA: Diagnosis not present

## 2023-09-04 DIAGNOSIS — Z992 Dependence on renal dialysis: Secondary | ICD-10-CM | POA: Diagnosis not present

## 2023-09-04 DIAGNOSIS — T7840XA Allergy, unspecified, initial encounter: Secondary | ICD-10-CM | POA: Diagnosis not present

## 2023-09-04 DIAGNOSIS — D509 Iron deficiency anemia, unspecified: Secondary | ICD-10-CM | POA: Diagnosis not present

## 2023-09-04 DIAGNOSIS — R52 Pain, unspecified: Secondary | ICD-10-CM | POA: Diagnosis not present

## 2023-09-04 DIAGNOSIS — N2581 Secondary hyperparathyroidism of renal origin: Secondary | ICD-10-CM | POA: Diagnosis not present

## 2023-09-04 DIAGNOSIS — D631 Anemia in chronic kidney disease: Secondary | ICD-10-CM | POA: Diagnosis not present

## 2023-09-04 DIAGNOSIS — N186 End stage renal disease: Secondary | ICD-10-CM | POA: Diagnosis not present

## 2023-09-04 DIAGNOSIS — R197 Diarrhea, unspecified: Secondary | ICD-10-CM | POA: Diagnosis not present

## 2023-09-05 ENCOUNTER — Ambulatory Visit: Payer: Medicare Other | Admitting: Physician Assistant

## 2023-09-06 ENCOUNTER — Other Ambulatory Visit: Payer: Self-pay | Admitting: Family Medicine

## 2023-09-06 DIAGNOSIS — T7840XA Allergy, unspecified, initial encounter: Secondary | ICD-10-CM | POA: Diagnosis not present

## 2023-09-06 DIAGNOSIS — N186 End stage renal disease: Secondary | ICD-10-CM | POA: Diagnosis not present

## 2023-09-06 DIAGNOSIS — Z992 Dependence on renal dialysis: Secondary | ICD-10-CM | POA: Diagnosis not present

## 2023-09-06 DIAGNOSIS — D631 Anemia in chronic kidney disease: Secondary | ICD-10-CM | POA: Diagnosis not present

## 2023-09-06 DIAGNOSIS — N2581 Secondary hyperparathyroidism of renal origin: Secondary | ICD-10-CM | POA: Diagnosis not present

## 2023-09-06 DIAGNOSIS — D689 Coagulation defect, unspecified: Secondary | ICD-10-CM | POA: Diagnosis not present

## 2023-09-06 DIAGNOSIS — R197 Diarrhea, unspecified: Secondary | ICD-10-CM | POA: Diagnosis not present

## 2023-09-06 DIAGNOSIS — D509 Iron deficiency anemia, unspecified: Secondary | ICD-10-CM | POA: Diagnosis not present

## 2023-09-06 DIAGNOSIS — R52 Pain, unspecified: Secondary | ICD-10-CM | POA: Diagnosis not present

## 2023-09-06 DIAGNOSIS — F419 Anxiety disorder, unspecified: Secondary | ICD-10-CM

## 2023-09-06 MED ORDER — ALPRAZOLAM 0.25 MG PO TABS: 0.2500 mg | ORAL_TABLET | Freq: Every day | ORAL | 0 refills | Status: DC | PRN

## 2023-09-06 NOTE — Telephone Encounter (Signed)
 Last apt. 08/18/23.

## 2023-09-08 DIAGNOSIS — R52 Pain, unspecified: Secondary | ICD-10-CM | POA: Diagnosis not present

## 2023-09-08 DIAGNOSIS — T7840XA Allergy, unspecified, initial encounter: Secondary | ICD-10-CM | POA: Diagnosis not present

## 2023-09-08 DIAGNOSIS — N186 End stage renal disease: Secondary | ICD-10-CM | POA: Diagnosis not present

## 2023-09-08 DIAGNOSIS — R197 Diarrhea, unspecified: Secondary | ICD-10-CM | POA: Diagnosis not present

## 2023-09-08 DIAGNOSIS — D631 Anemia in chronic kidney disease: Secondary | ICD-10-CM | POA: Diagnosis not present

## 2023-09-08 DIAGNOSIS — N2581 Secondary hyperparathyroidism of renal origin: Secondary | ICD-10-CM | POA: Diagnosis not present

## 2023-09-08 DIAGNOSIS — D509 Iron deficiency anemia, unspecified: Secondary | ICD-10-CM | POA: Diagnosis not present

## 2023-09-08 DIAGNOSIS — D689 Coagulation defect, unspecified: Secondary | ICD-10-CM | POA: Diagnosis not present

## 2023-09-08 DIAGNOSIS — Z992 Dependence on renal dialysis: Secondary | ICD-10-CM | POA: Diagnosis not present

## 2023-09-11 DIAGNOSIS — R197 Diarrhea, unspecified: Secondary | ICD-10-CM | POA: Diagnosis not present

## 2023-09-11 DIAGNOSIS — D631 Anemia in chronic kidney disease: Secondary | ICD-10-CM | POA: Diagnosis not present

## 2023-09-11 DIAGNOSIS — D689 Coagulation defect, unspecified: Secondary | ICD-10-CM | POA: Diagnosis not present

## 2023-09-11 DIAGNOSIS — N2581 Secondary hyperparathyroidism of renal origin: Secondary | ICD-10-CM | POA: Diagnosis not present

## 2023-09-11 DIAGNOSIS — N186 End stage renal disease: Secondary | ICD-10-CM | POA: Diagnosis not present

## 2023-09-11 DIAGNOSIS — T7840XA Allergy, unspecified, initial encounter: Secondary | ICD-10-CM | POA: Diagnosis not present

## 2023-09-11 DIAGNOSIS — R52 Pain, unspecified: Secondary | ICD-10-CM | POA: Diagnosis not present

## 2023-09-11 DIAGNOSIS — D509 Iron deficiency anemia, unspecified: Secondary | ICD-10-CM | POA: Diagnosis not present

## 2023-09-11 DIAGNOSIS — Z992 Dependence on renal dialysis: Secondary | ICD-10-CM | POA: Diagnosis not present

## 2023-09-13 DIAGNOSIS — D689 Coagulation defect, unspecified: Secondary | ICD-10-CM | POA: Diagnosis not present

## 2023-09-13 DIAGNOSIS — T7840XA Allergy, unspecified, initial encounter: Secondary | ICD-10-CM | POA: Diagnosis not present

## 2023-09-13 DIAGNOSIS — N186 End stage renal disease: Secondary | ICD-10-CM | POA: Diagnosis not present

## 2023-09-13 DIAGNOSIS — D631 Anemia in chronic kidney disease: Secondary | ICD-10-CM | POA: Diagnosis not present

## 2023-09-13 DIAGNOSIS — Z992 Dependence on renal dialysis: Secondary | ICD-10-CM | POA: Diagnosis not present

## 2023-09-13 DIAGNOSIS — D509 Iron deficiency anemia, unspecified: Secondary | ICD-10-CM | POA: Diagnosis not present

## 2023-09-13 DIAGNOSIS — R197 Diarrhea, unspecified: Secondary | ICD-10-CM | POA: Diagnosis not present

## 2023-09-13 DIAGNOSIS — N2581 Secondary hyperparathyroidism of renal origin: Secondary | ICD-10-CM | POA: Diagnosis not present

## 2023-09-13 DIAGNOSIS — R52 Pain, unspecified: Secondary | ICD-10-CM | POA: Diagnosis not present

## 2023-09-15 DIAGNOSIS — N186 End stage renal disease: Secondary | ICD-10-CM | POA: Diagnosis not present

## 2023-09-15 DIAGNOSIS — D689 Coagulation defect, unspecified: Secondary | ICD-10-CM | POA: Diagnosis not present

## 2023-09-15 DIAGNOSIS — N2581 Secondary hyperparathyroidism of renal origin: Secondary | ICD-10-CM | POA: Diagnosis not present

## 2023-09-15 DIAGNOSIS — Z992 Dependence on renal dialysis: Secondary | ICD-10-CM | POA: Diagnosis not present

## 2023-09-15 DIAGNOSIS — D509 Iron deficiency anemia, unspecified: Secondary | ICD-10-CM | POA: Diagnosis not present

## 2023-09-15 DIAGNOSIS — R197 Diarrhea, unspecified: Secondary | ICD-10-CM | POA: Diagnosis not present

## 2023-09-15 DIAGNOSIS — R52 Pain, unspecified: Secondary | ICD-10-CM | POA: Diagnosis not present

## 2023-09-15 DIAGNOSIS — T7840XA Allergy, unspecified, initial encounter: Secondary | ICD-10-CM | POA: Diagnosis not present

## 2023-09-15 DIAGNOSIS — D631 Anemia in chronic kidney disease: Secondary | ICD-10-CM | POA: Diagnosis not present

## 2023-09-18 DIAGNOSIS — D509 Iron deficiency anemia, unspecified: Secondary | ICD-10-CM | POA: Diagnosis not present

## 2023-09-18 DIAGNOSIS — D689 Coagulation defect, unspecified: Secondary | ICD-10-CM | POA: Diagnosis not present

## 2023-09-18 DIAGNOSIS — R52 Pain, unspecified: Secondary | ICD-10-CM | POA: Diagnosis not present

## 2023-09-18 DIAGNOSIS — N2581 Secondary hyperparathyroidism of renal origin: Secondary | ICD-10-CM | POA: Diagnosis not present

## 2023-09-18 DIAGNOSIS — Z992 Dependence on renal dialysis: Secondary | ICD-10-CM | POA: Diagnosis not present

## 2023-09-18 DIAGNOSIS — R197 Diarrhea, unspecified: Secondary | ICD-10-CM | POA: Diagnosis not present

## 2023-09-18 DIAGNOSIS — N186 End stage renal disease: Secondary | ICD-10-CM | POA: Diagnosis not present

## 2023-09-18 DIAGNOSIS — D631 Anemia in chronic kidney disease: Secondary | ICD-10-CM | POA: Diagnosis not present

## 2023-09-18 DIAGNOSIS — T7840XA Allergy, unspecified, initial encounter: Secondary | ICD-10-CM | POA: Diagnosis not present

## 2023-09-20 DIAGNOSIS — D631 Anemia in chronic kidney disease: Secondary | ICD-10-CM | POA: Diagnosis not present

## 2023-09-20 DIAGNOSIS — D689 Coagulation defect, unspecified: Secondary | ICD-10-CM | POA: Diagnosis not present

## 2023-09-20 DIAGNOSIS — N186 End stage renal disease: Secondary | ICD-10-CM | POA: Diagnosis not present

## 2023-09-20 DIAGNOSIS — D509 Iron deficiency anemia, unspecified: Secondary | ICD-10-CM | POA: Diagnosis not present

## 2023-09-20 DIAGNOSIS — T7840XA Allergy, unspecified, initial encounter: Secondary | ICD-10-CM | POA: Diagnosis not present

## 2023-09-20 DIAGNOSIS — Z992 Dependence on renal dialysis: Secondary | ICD-10-CM | POA: Diagnosis not present

## 2023-09-20 DIAGNOSIS — R52 Pain, unspecified: Secondary | ICD-10-CM | POA: Diagnosis not present

## 2023-09-20 DIAGNOSIS — N2581 Secondary hyperparathyroidism of renal origin: Secondary | ICD-10-CM | POA: Diagnosis not present

## 2023-09-20 DIAGNOSIS — R197 Diarrhea, unspecified: Secondary | ICD-10-CM | POA: Diagnosis not present

## 2023-09-22 DIAGNOSIS — T7840XA Allergy, unspecified, initial encounter: Secondary | ICD-10-CM | POA: Diagnosis not present

## 2023-09-22 DIAGNOSIS — Z992 Dependence on renal dialysis: Secondary | ICD-10-CM | POA: Diagnosis not present

## 2023-09-22 DIAGNOSIS — D509 Iron deficiency anemia, unspecified: Secondary | ICD-10-CM | POA: Diagnosis not present

## 2023-09-22 DIAGNOSIS — N186 End stage renal disease: Secondary | ICD-10-CM | POA: Diagnosis not present

## 2023-09-22 DIAGNOSIS — D631 Anemia in chronic kidney disease: Secondary | ICD-10-CM | POA: Diagnosis not present

## 2023-09-22 DIAGNOSIS — R52 Pain, unspecified: Secondary | ICD-10-CM | POA: Diagnosis not present

## 2023-09-22 DIAGNOSIS — D689 Coagulation defect, unspecified: Secondary | ICD-10-CM | POA: Diagnosis not present

## 2023-09-22 DIAGNOSIS — N2581 Secondary hyperparathyroidism of renal origin: Secondary | ICD-10-CM | POA: Diagnosis not present

## 2023-09-22 DIAGNOSIS — R197 Diarrhea, unspecified: Secondary | ICD-10-CM | POA: Diagnosis not present

## 2023-09-25 DIAGNOSIS — Z992 Dependence on renal dialysis: Secondary | ICD-10-CM | POA: Diagnosis not present

## 2023-09-25 DIAGNOSIS — R52 Pain, unspecified: Secondary | ICD-10-CM | POA: Diagnosis not present

## 2023-09-25 DIAGNOSIS — T7840XA Allergy, unspecified, initial encounter: Secondary | ICD-10-CM | POA: Diagnosis not present

## 2023-09-25 DIAGNOSIS — N2581 Secondary hyperparathyroidism of renal origin: Secondary | ICD-10-CM | POA: Diagnosis not present

## 2023-09-25 DIAGNOSIS — D631 Anemia in chronic kidney disease: Secondary | ICD-10-CM | POA: Diagnosis not present

## 2023-09-25 DIAGNOSIS — D689 Coagulation defect, unspecified: Secondary | ICD-10-CM | POA: Diagnosis not present

## 2023-09-25 DIAGNOSIS — N186 End stage renal disease: Secondary | ICD-10-CM | POA: Diagnosis not present

## 2023-09-25 DIAGNOSIS — R197 Diarrhea, unspecified: Secondary | ICD-10-CM | POA: Diagnosis not present

## 2023-09-25 DIAGNOSIS — D509 Iron deficiency anemia, unspecified: Secondary | ICD-10-CM | POA: Diagnosis not present

## 2023-09-26 ENCOUNTER — Ambulatory Visit: Admitting: Nurse Practitioner

## 2023-09-27 DIAGNOSIS — R52 Pain, unspecified: Secondary | ICD-10-CM | POA: Diagnosis not present

## 2023-09-27 DIAGNOSIS — R197 Diarrhea, unspecified: Secondary | ICD-10-CM | POA: Diagnosis not present

## 2023-09-27 DIAGNOSIS — Z992 Dependence on renal dialysis: Secondary | ICD-10-CM | POA: Diagnosis not present

## 2023-09-27 DIAGNOSIS — N2581 Secondary hyperparathyroidism of renal origin: Secondary | ICD-10-CM | POA: Diagnosis not present

## 2023-09-27 DIAGNOSIS — D631 Anemia in chronic kidney disease: Secondary | ICD-10-CM | POA: Diagnosis not present

## 2023-09-27 DIAGNOSIS — N186 End stage renal disease: Secondary | ICD-10-CM | POA: Diagnosis not present

## 2023-09-27 DIAGNOSIS — D689 Coagulation defect, unspecified: Secondary | ICD-10-CM | POA: Diagnosis not present

## 2023-09-27 DIAGNOSIS — T7840XA Allergy, unspecified, initial encounter: Secondary | ICD-10-CM | POA: Diagnosis not present

## 2023-09-27 DIAGNOSIS — D509 Iron deficiency anemia, unspecified: Secondary | ICD-10-CM | POA: Diagnosis not present

## 2023-09-29 DIAGNOSIS — Z992 Dependence on renal dialysis: Secondary | ICD-10-CM | POA: Diagnosis not present

## 2023-09-29 DIAGNOSIS — D631 Anemia in chronic kidney disease: Secondary | ICD-10-CM | POA: Diagnosis not present

## 2023-09-29 DIAGNOSIS — T7840XA Allergy, unspecified, initial encounter: Secondary | ICD-10-CM | POA: Diagnosis not present

## 2023-09-29 DIAGNOSIS — D509 Iron deficiency anemia, unspecified: Secondary | ICD-10-CM | POA: Diagnosis not present

## 2023-09-29 DIAGNOSIS — R52 Pain, unspecified: Secondary | ICD-10-CM | POA: Diagnosis not present

## 2023-09-29 DIAGNOSIS — D689 Coagulation defect, unspecified: Secondary | ICD-10-CM | POA: Diagnosis not present

## 2023-09-29 DIAGNOSIS — R197 Diarrhea, unspecified: Secondary | ICD-10-CM | POA: Diagnosis not present

## 2023-09-29 DIAGNOSIS — N2581 Secondary hyperparathyroidism of renal origin: Secondary | ICD-10-CM | POA: Diagnosis not present

## 2023-09-29 DIAGNOSIS — N186 End stage renal disease: Secondary | ICD-10-CM | POA: Diagnosis not present

## 2023-10-02 DIAGNOSIS — N138 Other obstructive and reflux uropathy: Secondary | ICD-10-CM | POA: Diagnosis not present

## 2023-10-02 DIAGNOSIS — D509 Iron deficiency anemia, unspecified: Secondary | ICD-10-CM | POA: Diagnosis not present

## 2023-10-02 DIAGNOSIS — R197 Diarrhea, unspecified: Secondary | ICD-10-CM | POA: Diagnosis not present

## 2023-10-02 DIAGNOSIS — N186 End stage renal disease: Secondary | ICD-10-CM | POA: Diagnosis not present

## 2023-10-02 DIAGNOSIS — R52 Pain, unspecified: Secondary | ICD-10-CM | POA: Diagnosis not present

## 2023-10-02 DIAGNOSIS — T7840XA Allergy, unspecified, initial encounter: Secondary | ICD-10-CM | POA: Diagnosis not present

## 2023-10-02 DIAGNOSIS — D631 Anemia in chronic kidney disease: Secondary | ICD-10-CM | POA: Diagnosis not present

## 2023-10-02 DIAGNOSIS — N2581 Secondary hyperparathyroidism of renal origin: Secondary | ICD-10-CM | POA: Diagnosis not present

## 2023-10-02 DIAGNOSIS — D689 Coagulation defect, unspecified: Secondary | ICD-10-CM | POA: Diagnosis not present

## 2023-10-02 DIAGNOSIS — Z992 Dependence on renal dialysis: Secondary | ICD-10-CM | POA: Diagnosis not present

## 2023-10-04 DIAGNOSIS — Z992 Dependence on renal dialysis: Secondary | ICD-10-CM | POA: Diagnosis not present

## 2023-10-04 DIAGNOSIS — R52 Pain, unspecified: Secondary | ICD-10-CM | POA: Diagnosis not present

## 2023-10-04 DIAGNOSIS — D631 Anemia in chronic kidney disease: Secondary | ICD-10-CM | POA: Diagnosis not present

## 2023-10-04 DIAGNOSIS — N139 Obstructive and reflux uropathy, unspecified: Secondary | ICD-10-CM | POA: Diagnosis not present

## 2023-10-04 DIAGNOSIS — R197 Diarrhea, unspecified: Secondary | ICD-10-CM | POA: Diagnosis not present

## 2023-10-04 DIAGNOSIS — N2581 Secondary hyperparathyroidism of renal origin: Secondary | ICD-10-CM | POA: Diagnosis not present

## 2023-10-04 DIAGNOSIS — T7840XA Allergy, unspecified, initial encounter: Secondary | ICD-10-CM | POA: Diagnosis not present

## 2023-10-04 DIAGNOSIS — D509 Iron deficiency anemia, unspecified: Secondary | ICD-10-CM | POA: Diagnosis not present

## 2023-10-04 DIAGNOSIS — D689 Coagulation defect, unspecified: Secondary | ICD-10-CM | POA: Diagnosis not present

## 2023-10-04 DIAGNOSIS — N186 End stage renal disease: Secondary | ICD-10-CM | POA: Diagnosis not present

## 2023-10-06 DIAGNOSIS — Z992 Dependence on renal dialysis: Secondary | ICD-10-CM | POA: Diagnosis not present

## 2023-10-06 DIAGNOSIS — N2581 Secondary hyperparathyroidism of renal origin: Secondary | ICD-10-CM | POA: Diagnosis not present

## 2023-10-06 DIAGNOSIS — T7840XA Allergy, unspecified, initial encounter: Secondary | ICD-10-CM | POA: Diagnosis not present

## 2023-10-06 DIAGNOSIS — D689 Coagulation defect, unspecified: Secondary | ICD-10-CM | POA: Diagnosis not present

## 2023-10-06 DIAGNOSIS — R197 Diarrhea, unspecified: Secondary | ICD-10-CM | POA: Diagnosis not present

## 2023-10-06 DIAGNOSIS — D631 Anemia in chronic kidney disease: Secondary | ICD-10-CM | POA: Diagnosis not present

## 2023-10-06 DIAGNOSIS — N186 End stage renal disease: Secondary | ICD-10-CM | POA: Diagnosis not present

## 2023-10-06 DIAGNOSIS — D509 Iron deficiency anemia, unspecified: Secondary | ICD-10-CM | POA: Diagnosis not present

## 2023-10-06 DIAGNOSIS — R52 Pain, unspecified: Secondary | ICD-10-CM | POA: Diagnosis not present

## 2023-10-06 DIAGNOSIS — N139 Obstructive and reflux uropathy, unspecified: Secondary | ICD-10-CM | POA: Diagnosis not present

## 2023-10-09 DIAGNOSIS — N139 Obstructive and reflux uropathy, unspecified: Secondary | ICD-10-CM | POA: Diagnosis not present

## 2023-10-09 DIAGNOSIS — R52 Pain, unspecified: Secondary | ICD-10-CM | POA: Diagnosis not present

## 2023-10-09 DIAGNOSIS — N186 End stage renal disease: Secondary | ICD-10-CM | POA: Diagnosis not present

## 2023-10-09 DIAGNOSIS — D509 Iron deficiency anemia, unspecified: Secondary | ICD-10-CM | POA: Diagnosis not present

## 2023-10-09 DIAGNOSIS — Z992 Dependence on renal dialysis: Secondary | ICD-10-CM | POA: Diagnosis not present

## 2023-10-09 DIAGNOSIS — N2581 Secondary hyperparathyroidism of renal origin: Secondary | ICD-10-CM | POA: Diagnosis not present

## 2023-10-09 DIAGNOSIS — D689 Coagulation defect, unspecified: Secondary | ICD-10-CM | POA: Diagnosis not present

## 2023-10-09 DIAGNOSIS — T7840XA Allergy, unspecified, initial encounter: Secondary | ICD-10-CM | POA: Diagnosis not present

## 2023-10-09 DIAGNOSIS — R197 Diarrhea, unspecified: Secondary | ICD-10-CM | POA: Diagnosis not present

## 2023-10-09 DIAGNOSIS — D631 Anemia in chronic kidney disease: Secondary | ICD-10-CM | POA: Diagnosis not present

## 2023-10-09 NOTE — Progress Notes (Deleted)
 Cardiology Office Note    Patient Name: Devon Cook Date of Encounter: 10/09/2023  Primary Care Provider:  Ronnald Nian, MD Primary Cardiologist:  Meriam Sprague, MD (Inactive) Primary Electrophysiologist: None   Past Medical History    Past Medical History:  Diagnosis Date   Anemia, chronic renal failure    followed by dr Signe Colt--- dx 05/ 2019---  treated with Iron infusions and procrit;   and blood transfusions   Anxiety    Arthritis    Bilateral hydronephrosis urologist-- dr Berneice Heinrich   malignant-- chronic;  treated with ureteral stents   Chronic gastritis    07-17-2019 and duodenitis   Depression    ED (erectile dysfunction)    Edema of both lower extremities    occ le edema wears compresssion hose daily   End stage renal failure on dialysis [facility])    nephrologist-- dr Signe Colt (@ [facility])-- secondary to obstructive uropathy secon  dary to prostate cancer hemodialysis mon/wed/fri @ [facility]---started first week of Nov 2020   GERD (gastroesophageal reflux disease)    Gout 2019   Herpes genitalis in men    History of adenomatous polyp of colon    History of hepatitis C 2017   dx 11/ 207-- treated with Harvoni (in epic 04/ 2019 lab result non-detectable)   History of lower GI bleeding 11/2017   Hypertension    followed by pcp   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 stage renal disease (HCC)    Metastatic cancer to bone (HCC)    secondary to primary prostate cancer  w/ progressive pain   OSA (obstructive sleep apnea)    severe osa w/ nocturnal hyoxemia per study 06-19-2019,  pt given cpap ( however on 08-30-2019 per pt having issues with mask fitting and on a new one so he not using the cpap);   (03-20-2020 still not using cpap, no mask)   Palpitations    isolated event at ED 10-05-2019 Aflutter/ Afib versus SVT ;   refer to pt's cardiologist note w/ Dr Shari Prows   Pneumonia    Prostate cancer metastatic  to multiple sites [facility]) urologist-- dr Berneice Heinrich   dx 06/ 2019-- advanced w/ mets to bone and pelvic lymphadeopathy   S/P arteriovenous (AV) fistula creation 03/2019   followed  by dr Edilia Bo (vascular)  03-15-2019  by dr Chestine Spore, left branhiocephallic;  last intervention balloon angioplasty 09-04-2020   Secondary hyperparathyroidism of renal origin [facility])    Urgency of urination    Wears glasses     History of Present Illness  Devon Cook is a 63 y.o. male with a PMH of paroxysmal AF,HFmrEF, HLD, HTN, ESRD, OSA (on CPAP), anemia, obesity, prostate CA (in remission) who presents today for 8-month follow-up.  [name] was seen initially by Dr. Shari Prows in 04/2020 for preoperative evaluation.  He had EKG completed that showed suspected atrial flutter and patient was started on rate control but no AC due to CHA2DS2-VASc of 1.  He was last seen by Eligha Bridegroom, NP on 02/13/2023 for evaluation of BP.  He had improvement of BP with systolic in the 180s and occasional chest pain that required an ED visit 1 month prior.  During visit he denied any worsening of his symptoms.  He also noted occasional nausea and shortness of breath with chest discomfort.  He was advised to seek ischemic evaluation if symptoms persist.  He underwent previous 2D echo on 04/2020 that  showed mildly reduced EF of 45-50% with global hypokinesis and mild concentric LVH and grade 1 DD and mildly dilated ascending aorta (42 mm).  He was continued on his current BP medications although BP was elevated in office patient reported controlled at home.  He underwent a cystoscopy procedure on 06/23/2023 with bilateral stents exchanged.   Patient denies chest pain, palpitations, dyspnea, PND, orthopnea, nausea, vomiting, dizziness, syncope, edema, weight gain, or early satiety.   Discussed the use of AI scribe software for clinical note transcription with the patient, who gave verbal consent to proceed.  History of Present  Illness    ***Notes: -Last ischemic evaluation:  Review of Systems  Please see the history of present illness.    All other systems reviewed and are otherwise negative except as noted above.  Physical Exam    Wt Readings from Last 3 Encounters:  08/22/23 289 lb 6.4 oz (131.3 kg)  06/23/23 278 lb (126.1 kg)  06/15/23 175 lb (79.4 kg)   WG:NFAOZ were no vitals filed for this visit.,There is no height or weight on file to calculate BMI. GEN: Well nourished, well developed in no acute distress Neck: No JVD; No carotid bruits Pulmonary: Clear to auscultation without rales, wheezing or rhonchi  Cardiovascular: Normal rate. Regular rhythm. Normal S1. Normal S2.   Murmurs: There is no murmur.  ABDOMEN: Soft, non-tender, non-distended EXTREMITIES:  No edema; No deformity   EKG/LABS/ Recent Cardiac Studies   ECG personally reviewed by me today - ***  Risk Assessment/Calculations:   {Does this patient have ATRIAL FIBRILLATION?[phone]}      Lab Results  Component Value Date   WBC 9.3 06/23/2023   HGB 9.6 (L) 06/23/2023   HCT 28.9 (L) 06/23/2023   MCV 82.3 06/23/2023   PLT 243 06/23/2023   Lab Results  Component Value Date   CREATININE 11.92 (H) 06/23/2023   BUN 53 (H) 06/23/2023   NA 139 06/23/2023   K 4.8 06/23/2023   CL 101 06/23/2023   CO2 23 06/23/2023   Lab Results  Component Value Date   CHOL 98 (L) 11/13/2017   HDL 32 (L) 11/13/2017   LDLCALC 52 11/13/2017   TRIG 72 11/13/2017   CHOLHDL 3.1 11/13/2017    Lab Results  Component Value Date   HGBA1C 5.1 01/11/2019   Assessment & Plan    1.  Essential hypertension:  2.  Paroxysmal AF:  3.HFmrEF:  4.  ESRD:  5.  Obesity:    Disposition: Follow-up with Meriam Sprague, MD (Inactive) or APP in *** months {Are you ordering a CV Procedure (e.g. stress test, cath, DCCV, TEE, etc)?   Press F2        :[id number]}   Signed, Napoleon Form, Leodis Rains, NP 10/09/2023, 9:56 AM [facility]

## 2023-10-10 ENCOUNTER — Telehealth: Payer: Self-pay | Admitting: Internal Medicine

## 2023-10-10 ENCOUNTER — Ambulatory Visit: Admitting: Nurse Practitioner

## 2023-10-10 ENCOUNTER — Ambulatory Visit: Payer: Medicare Other | Admitting: Family Medicine

## 2023-10-10 DIAGNOSIS — N5201 Erectile dysfunction due to arterial insufficiency: Secondary | ICD-10-CM

## 2023-10-10 DIAGNOSIS — E669 Obesity, unspecified: Secondary | ICD-10-CM

## 2023-10-10 DIAGNOSIS — C61 Malignant neoplasm of prostate: Secondary | ICD-10-CM

## 2023-10-10 DIAGNOSIS — D638 Anemia in other chronic diseases classified elsewhere: Secondary | ICD-10-CM

## 2023-10-10 DIAGNOSIS — I10 Essential (primary) hypertension: Secondary | ICD-10-CM

## 2023-10-10 DIAGNOSIS — Z23 Encounter for immunization: Secondary | ICD-10-CM

## 2023-10-10 DIAGNOSIS — Z13220 Encounter for screening for lipoid disorders: Secondary | ICD-10-CM

## 2023-10-10 DIAGNOSIS — N1832 Chronic kidney disease, stage 3b: Secondary | ICD-10-CM

## 2023-10-10 DIAGNOSIS — I480 Paroxysmal atrial fibrillation: Secondary | ICD-10-CM

## 2023-10-10 DIAGNOSIS — Z0000 Encounter for general adult medical examination without abnormal findings: Secondary | ICD-10-CM

## 2023-10-10 DIAGNOSIS — N186 End stage renal disease: Secondary | ICD-10-CM

## 2023-10-10 DIAGNOSIS — G4733 Obstructive sleep apnea (adult) (pediatric): Secondary | ICD-10-CM

## 2023-10-10 DIAGNOSIS — I151 Hypertension secondary to other renal disorders: Secondary | ICD-10-CM

## 2023-10-10 DIAGNOSIS — D126 Benign neoplasm of colon, unspecified: Secondary | ICD-10-CM

## 2023-10-10 DIAGNOSIS — I5022 Chronic systolic (congestive) heart failure: Secondary | ICD-10-CM

## 2023-10-10 NOTE — Telephone Encounter (Signed)
 New Message:      Patient would like to switch from Dr Izora Ribas to Dr Jacques Navy. Is this alright with you?

## 2023-10-11 DIAGNOSIS — D689 Coagulation defect, unspecified: Secondary | ICD-10-CM | POA: Diagnosis not present

## 2023-10-11 DIAGNOSIS — D509 Iron deficiency anemia, unspecified: Secondary | ICD-10-CM | POA: Diagnosis not present

## 2023-10-11 DIAGNOSIS — R197 Diarrhea, unspecified: Secondary | ICD-10-CM | POA: Diagnosis not present

## 2023-10-11 DIAGNOSIS — Z992 Dependence on renal dialysis: Secondary | ICD-10-CM | POA: Diagnosis not present

## 2023-10-11 DIAGNOSIS — R52 Pain, unspecified: Secondary | ICD-10-CM | POA: Diagnosis not present

## 2023-10-11 DIAGNOSIS — N139 Obstructive and reflux uropathy, unspecified: Secondary | ICD-10-CM | POA: Diagnosis not present

## 2023-10-11 DIAGNOSIS — T7840XA Allergy, unspecified, initial encounter: Secondary | ICD-10-CM | POA: Diagnosis not present

## 2023-10-11 DIAGNOSIS — D631 Anemia in chronic kidney disease: Secondary | ICD-10-CM | POA: Diagnosis not present

## 2023-10-11 DIAGNOSIS — N2581 Secondary hyperparathyroidism of renal origin: Secondary | ICD-10-CM | POA: Diagnosis not present

## 2023-10-11 DIAGNOSIS — N186 End stage renal disease: Secondary | ICD-10-CM | POA: Diagnosis not present

## 2023-10-13 DIAGNOSIS — N186 End stage renal disease: Secondary | ICD-10-CM | POA: Diagnosis not present

## 2023-10-13 DIAGNOSIS — N2581 Secondary hyperparathyroidism of renal origin: Secondary | ICD-10-CM | POA: Diagnosis not present

## 2023-10-13 DIAGNOSIS — R52 Pain, unspecified: Secondary | ICD-10-CM | POA: Diagnosis not present

## 2023-10-13 DIAGNOSIS — D689 Coagulation defect, unspecified: Secondary | ICD-10-CM | POA: Diagnosis not present

## 2023-10-13 DIAGNOSIS — N139 Obstructive and reflux uropathy, unspecified: Secondary | ICD-10-CM | POA: Diagnosis not present

## 2023-10-13 DIAGNOSIS — Z992 Dependence on renal dialysis: Secondary | ICD-10-CM | POA: Diagnosis not present

## 2023-10-13 DIAGNOSIS — R197 Diarrhea, unspecified: Secondary | ICD-10-CM | POA: Diagnosis not present

## 2023-10-13 DIAGNOSIS — D509 Iron deficiency anemia, unspecified: Secondary | ICD-10-CM | POA: Diagnosis not present

## 2023-10-13 DIAGNOSIS — D631 Anemia in chronic kidney disease: Secondary | ICD-10-CM | POA: Diagnosis not present

## 2023-10-13 DIAGNOSIS — T7840XA Allergy, unspecified, initial encounter: Secondary | ICD-10-CM | POA: Diagnosis not present

## 2023-10-16 DIAGNOSIS — N2581 Secondary hyperparathyroidism of renal origin: Secondary | ICD-10-CM | POA: Diagnosis not present

## 2023-10-16 DIAGNOSIS — D631 Anemia in chronic kidney disease: Secondary | ICD-10-CM | POA: Diagnosis not present

## 2023-10-16 DIAGNOSIS — R52 Pain, unspecified: Secondary | ICD-10-CM | POA: Diagnosis not present

## 2023-10-16 DIAGNOSIS — R197 Diarrhea, unspecified: Secondary | ICD-10-CM | POA: Diagnosis not present

## 2023-10-16 DIAGNOSIS — Z992 Dependence on renal dialysis: Secondary | ICD-10-CM | POA: Diagnosis not present

## 2023-10-16 DIAGNOSIS — N186 End stage renal disease: Secondary | ICD-10-CM | POA: Diagnosis not present

## 2023-10-16 DIAGNOSIS — D509 Iron deficiency anemia, unspecified: Secondary | ICD-10-CM | POA: Diagnosis not present

## 2023-10-16 DIAGNOSIS — D689 Coagulation defect, unspecified: Secondary | ICD-10-CM | POA: Diagnosis not present

## 2023-10-16 DIAGNOSIS — T7840XA Allergy, unspecified, initial encounter: Secondary | ICD-10-CM | POA: Diagnosis not present

## 2023-10-16 DIAGNOSIS — N139 Obstructive and reflux uropathy, unspecified: Secondary | ICD-10-CM | POA: Diagnosis not present

## 2023-10-16 NOTE — Telephone Encounter (Signed)
 Hello Dr Avanell Bob are you ok with taking on this pt??

## 2023-10-18 DIAGNOSIS — D689 Coagulation defect, unspecified: Secondary | ICD-10-CM | POA: Diagnosis not present

## 2023-10-18 DIAGNOSIS — N139 Obstructive and reflux uropathy, unspecified: Secondary | ICD-10-CM | POA: Diagnosis not present

## 2023-10-18 DIAGNOSIS — D509 Iron deficiency anemia, unspecified: Secondary | ICD-10-CM | POA: Diagnosis not present

## 2023-10-18 DIAGNOSIS — N2581 Secondary hyperparathyroidism of renal origin: Secondary | ICD-10-CM | POA: Diagnosis not present

## 2023-10-18 DIAGNOSIS — T7840XA Allergy, unspecified, initial encounter: Secondary | ICD-10-CM | POA: Diagnosis not present

## 2023-10-18 DIAGNOSIS — R197 Diarrhea, unspecified: Secondary | ICD-10-CM | POA: Diagnosis not present

## 2023-10-18 DIAGNOSIS — R52 Pain, unspecified: Secondary | ICD-10-CM | POA: Diagnosis not present

## 2023-10-18 DIAGNOSIS — D631 Anemia in chronic kidney disease: Secondary | ICD-10-CM | POA: Diagnosis not present

## 2023-10-18 DIAGNOSIS — N186 End stage renal disease: Secondary | ICD-10-CM | POA: Diagnosis not present

## 2023-10-18 DIAGNOSIS — Z992 Dependence on renal dialysis: Secondary | ICD-10-CM | POA: Diagnosis not present

## 2023-10-20 DIAGNOSIS — N186 End stage renal disease: Secondary | ICD-10-CM | POA: Diagnosis not present

## 2023-10-20 DIAGNOSIS — N139 Obstructive and reflux uropathy, unspecified: Secondary | ICD-10-CM | POA: Diagnosis not present

## 2023-10-20 DIAGNOSIS — T7840XA Allergy, unspecified, initial encounter: Secondary | ICD-10-CM | POA: Diagnosis not present

## 2023-10-20 DIAGNOSIS — N2581 Secondary hyperparathyroidism of renal origin: Secondary | ICD-10-CM | POA: Diagnosis not present

## 2023-10-20 DIAGNOSIS — Z992 Dependence on renal dialysis: Secondary | ICD-10-CM | POA: Diagnosis not present

## 2023-10-20 DIAGNOSIS — D631 Anemia in chronic kidney disease: Secondary | ICD-10-CM | POA: Diagnosis not present

## 2023-10-20 DIAGNOSIS — R197 Diarrhea, unspecified: Secondary | ICD-10-CM | POA: Diagnosis not present

## 2023-10-20 DIAGNOSIS — R52 Pain, unspecified: Secondary | ICD-10-CM | POA: Diagnosis not present

## 2023-10-20 DIAGNOSIS — D689 Coagulation defect, unspecified: Secondary | ICD-10-CM | POA: Diagnosis not present

## 2023-10-20 DIAGNOSIS — D509 Iron deficiency anemia, unspecified: Secondary | ICD-10-CM | POA: Diagnosis not present

## 2023-10-23 ENCOUNTER — Ambulatory Visit: Admitting: Family Medicine

## 2023-10-23 ENCOUNTER — Encounter: Payer: Self-pay | Admitting: Family Medicine

## 2023-10-23 DIAGNOSIS — D631 Anemia in chronic kidney disease: Secondary | ICD-10-CM | POA: Diagnosis not present

## 2023-10-23 DIAGNOSIS — D509 Iron deficiency anemia, unspecified: Secondary | ICD-10-CM | POA: Diagnosis not present

## 2023-10-23 DIAGNOSIS — N185 Chronic kidney disease, stage 5: Secondary | ICD-10-CM

## 2023-10-23 DIAGNOSIS — Z0000 Encounter for general adult medical examination without abnormal findings: Secondary | ICD-10-CM

## 2023-10-23 DIAGNOSIS — G4733 Obstructive sleep apnea (adult) (pediatric): Secondary | ICD-10-CM

## 2023-10-23 DIAGNOSIS — N186 End stage renal disease: Secondary | ICD-10-CM

## 2023-10-23 DIAGNOSIS — R52 Pain, unspecified: Secondary | ICD-10-CM | POA: Diagnosis not present

## 2023-10-23 DIAGNOSIS — I10 Essential (primary) hypertension: Secondary | ICD-10-CM

## 2023-10-23 DIAGNOSIS — T7840XA Allergy, unspecified, initial encounter: Secondary | ICD-10-CM | POA: Diagnosis not present

## 2023-10-23 DIAGNOSIS — N1339 Other hydronephrosis: Secondary | ICD-10-CM

## 2023-10-23 DIAGNOSIS — R7301 Impaired fasting glucose: Secondary | ICD-10-CM

## 2023-10-23 DIAGNOSIS — R197 Diarrhea, unspecified: Secondary | ICD-10-CM | POA: Diagnosis not present

## 2023-10-23 DIAGNOSIS — C61 Malignant neoplasm of prostate: Secondary | ICD-10-CM

## 2023-10-23 DIAGNOSIS — N2581 Secondary hyperparathyroidism of renal origin: Secondary | ICD-10-CM | POA: Diagnosis not present

## 2023-10-23 DIAGNOSIS — D689 Coagulation defect, unspecified: Secondary | ICD-10-CM | POA: Diagnosis not present

## 2023-10-23 DIAGNOSIS — Z23 Encounter for immunization: Secondary | ICD-10-CM

## 2023-10-23 DIAGNOSIS — Z992 Dependence on renal dialysis: Secondary | ICD-10-CM | POA: Diagnosis not present

## 2023-10-23 DIAGNOSIS — N139 Obstructive and reflux uropathy, unspecified: Secondary | ICD-10-CM | POA: Diagnosis not present

## 2023-10-25 DIAGNOSIS — T7840XA Allergy, unspecified, initial encounter: Secondary | ICD-10-CM | POA: Diagnosis not present

## 2023-10-25 DIAGNOSIS — R197 Diarrhea, unspecified: Secondary | ICD-10-CM | POA: Diagnosis not present

## 2023-10-25 DIAGNOSIS — N2581 Secondary hyperparathyroidism of renal origin: Secondary | ICD-10-CM | POA: Diagnosis not present

## 2023-10-25 DIAGNOSIS — D631 Anemia in chronic kidney disease: Secondary | ICD-10-CM | POA: Diagnosis not present

## 2023-10-25 DIAGNOSIS — R52 Pain, unspecified: Secondary | ICD-10-CM | POA: Diagnosis not present

## 2023-10-25 DIAGNOSIS — D509 Iron deficiency anemia, unspecified: Secondary | ICD-10-CM | POA: Diagnosis not present

## 2023-10-25 DIAGNOSIS — N139 Obstructive and reflux uropathy, unspecified: Secondary | ICD-10-CM | POA: Diagnosis not present

## 2023-10-25 DIAGNOSIS — N186 End stage renal disease: Secondary | ICD-10-CM | POA: Diagnosis not present

## 2023-10-25 DIAGNOSIS — D689 Coagulation defect, unspecified: Secondary | ICD-10-CM | POA: Diagnosis not present

## 2023-10-25 DIAGNOSIS — Z992 Dependence on renal dialysis: Secondary | ICD-10-CM | POA: Diagnosis not present

## 2023-10-27 DIAGNOSIS — N2581 Secondary hyperparathyroidism of renal origin: Secondary | ICD-10-CM | POA: Diagnosis not present

## 2023-10-27 DIAGNOSIS — Z992 Dependence on renal dialysis: Secondary | ICD-10-CM | POA: Diagnosis not present

## 2023-10-27 DIAGNOSIS — D689 Coagulation defect, unspecified: Secondary | ICD-10-CM | POA: Diagnosis not present

## 2023-10-27 DIAGNOSIS — R52 Pain, unspecified: Secondary | ICD-10-CM | POA: Diagnosis not present

## 2023-10-27 DIAGNOSIS — T7840XA Allergy, unspecified, initial encounter: Secondary | ICD-10-CM | POA: Diagnosis not present

## 2023-10-27 DIAGNOSIS — R197 Diarrhea, unspecified: Secondary | ICD-10-CM | POA: Diagnosis not present

## 2023-10-27 DIAGNOSIS — N139 Obstructive and reflux uropathy, unspecified: Secondary | ICD-10-CM | POA: Diagnosis not present

## 2023-10-27 DIAGNOSIS — D631 Anemia in chronic kidney disease: Secondary | ICD-10-CM | POA: Diagnosis not present

## 2023-10-27 DIAGNOSIS — N186 End stage renal disease: Secondary | ICD-10-CM | POA: Diagnosis not present

## 2023-10-27 DIAGNOSIS — D509 Iron deficiency anemia, unspecified: Secondary | ICD-10-CM | POA: Diagnosis not present

## 2023-10-29 ENCOUNTER — Other Ambulatory Visit: Payer: Self-pay | Admitting: Family Medicine

## 2023-10-29 DIAGNOSIS — F419 Anxiety disorder, unspecified: Secondary | ICD-10-CM

## 2023-10-30 DIAGNOSIS — Z992 Dependence on renal dialysis: Secondary | ICD-10-CM | POA: Diagnosis not present

## 2023-10-30 DIAGNOSIS — D689 Coagulation defect, unspecified: Secondary | ICD-10-CM | POA: Diagnosis not present

## 2023-10-30 DIAGNOSIS — D631 Anemia in chronic kidney disease: Secondary | ICD-10-CM | POA: Diagnosis not present

## 2023-10-30 DIAGNOSIS — D509 Iron deficiency anemia, unspecified: Secondary | ICD-10-CM | POA: Diagnosis not present

## 2023-10-30 DIAGNOSIS — N2581 Secondary hyperparathyroidism of renal origin: Secondary | ICD-10-CM | POA: Diagnosis not present

## 2023-10-30 DIAGNOSIS — N186 End stage renal disease: Secondary | ICD-10-CM | POA: Diagnosis not present

## 2023-10-30 DIAGNOSIS — N139 Obstructive and reflux uropathy, unspecified: Secondary | ICD-10-CM | POA: Diagnosis not present

## 2023-10-30 DIAGNOSIS — T7840XA Allergy, unspecified, initial encounter: Secondary | ICD-10-CM | POA: Diagnosis not present

## 2023-10-30 DIAGNOSIS — R197 Diarrhea, unspecified: Secondary | ICD-10-CM | POA: Diagnosis not present

## 2023-10-30 DIAGNOSIS — R52 Pain, unspecified: Secondary | ICD-10-CM | POA: Diagnosis not present

## 2023-10-31 MED ORDER — ALPRAZOLAM 0.25 MG PO TABS: 0.2500 mg | ORAL_TABLET | Freq: Every day | ORAL | 0 refills | Status: DC | PRN

## 2023-11-01 DIAGNOSIS — D509 Iron deficiency anemia, unspecified: Secondary | ICD-10-CM | POA: Diagnosis not present

## 2023-11-01 DIAGNOSIS — D689 Coagulation defect, unspecified: Secondary | ICD-10-CM | POA: Diagnosis not present

## 2023-11-01 DIAGNOSIS — D631 Anemia in chronic kidney disease: Secondary | ICD-10-CM | POA: Diagnosis not present

## 2023-11-01 DIAGNOSIS — N138 Other obstructive and reflux uropathy: Secondary | ICD-10-CM | POA: Diagnosis not present

## 2023-11-01 DIAGNOSIS — N2581 Secondary hyperparathyroidism of renal origin: Secondary | ICD-10-CM | POA: Diagnosis not present

## 2023-11-01 DIAGNOSIS — R197 Diarrhea, unspecified: Secondary | ICD-10-CM | POA: Diagnosis not present

## 2023-11-01 DIAGNOSIS — R52 Pain, unspecified: Secondary | ICD-10-CM | POA: Diagnosis not present

## 2023-11-01 DIAGNOSIS — Z992 Dependence on renal dialysis: Secondary | ICD-10-CM | POA: Diagnosis not present

## 2023-11-01 DIAGNOSIS — N186 End stage renal disease: Secondary | ICD-10-CM | POA: Diagnosis not present

## 2023-11-01 DIAGNOSIS — T7840XA Allergy, unspecified, initial encounter: Secondary | ICD-10-CM | POA: Diagnosis not present

## 2023-11-01 DIAGNOSIS — N139 Obstructive and reflux uropathy, unspecified: Secondary | ICD-10-CM | POA: Diagnosis not present

## 2023-11-02 DIAGNOSIS — N186 End stage renal disease: Secondary | ICD-10-CM | POA: Diagnosis not present

## 2023-11-02 DIAGNOSIS — C7951 Secondary malignant neoplasm of bone: Secondary | ICD-10-CM | POA: Diagnosis not present

## 2023-11-02 DIAGNOSIS — N130 Hydronephrosis with ureteropelvic junction obstruction: Secondary | ICD-10-CM | POA: Diagnosis not present

## 2023-11-03 DIAGNOSIS — R52 Pain, unspecified: Secondary | ICD-10-CM | POA: Diagnosis not present

## 2023-11-03 DIAGNOSIS — D509 Iron deficiency anemia, unspecified: Secondary | ICD-10-CM | POA: Diagnosis not present

## 2023-11-03 DIAGNOSIS — N186 End stage renal disease: Secondary | ICD-10-CM | POA: Diagnosis not present

## 2023-11-03 DIAGNOSIS — Z992 Dependence on renal dialysis: Secondary | ICD-10-CM | POA: Diagnosis not present

## 2023-11-03 DIAGNOSIS — R197 Diarrhea, unspecified: Secondary | ICD-10-CM | POA: Diagnosis not present

## 2023-11-03 DIAGNOSIS — D689 Coagulation defect, unspecified: Secondary | ICD-10-CM | POA: Diagnosis not present

## 2023-11-03 DIAGNOSIS — T7840XA Allergy, unspecified, initial encounter: Secondary | ICD-10-CM | POA: Diagnosis not present

## 2023-11-03 DIAGNOSIS — D631 Anemia in chronic kidney disease: Secondary | ICD-10-CM | POA: Diagnosis not present

## 2023-11-03 DIAGNOSIS — N2581 Secondary hyperparathyroidism of renal origin: Secondary | ICD-10-CM | POA: Diagnosis not present

## 2023-11-06 DIAGNOSIS — T7840XA Allergy, unspecified, initial encounter: Secondary | ICD-10-CM | POA: Diagnosis not present

## 2023-11-06 DIAGNOSIS — D509 Iron deficiency anemia, unspecified: Secondary | ICD-10-CM | POA: Diagnosis not present

## 2023-11-06 DIAGNOSIS — R197 Diarrhea, unspecified: Secondary | ICD-10-CM | POA: Diagnosis not present

## 2023-11-06 DIAGNOSIS — Z992 Dependence on renal dialysis: Secondary | ICD-10-CM | POA: Diagnosis not present

## 2023-11-06 DIAGNOSIS — D689 Coagulation defect, unspecified: Secondary | ICD-10-CM | POA: Diagnosis not present

## 2023-11-06 DIAGNOSIS — N2581 Secondary hyperparathyroidism of renal origin: Secondary | ICD-10-CM | POA: Diagnosis not present

## 2023-11-06 DIAGNOSIS — N186 End stage renal disease: Secondary | ICD-10-CM | POA: Diagnosis not present

## 2023-11-06 DIAGNOSIS — R52 Pain, unspecified: Secondary | ICD-10-CM | POA: Diagnosis not present

## 2023-11-06 DIAGNOSIS — D631 Anemia in chronic kidney disease: Secondary | ICD-10-CM | POA: Diagnosis not present

## 2023-11-08 DIAGNOSIS — N2581 Secondary hyperparathyroidism of renal origin: Secondary | ICD-10-CM | POA: Diagnosis not present

## 2023-11-08 DIAGNOSIS — R197 Diarrhea, unspecified: Secondary | ICD-10-CM | POA: Diagnosis not present

## 2023-11-08 DIAGNOSIS — T7840XA Allergy, unspecified, initial encounter: Secondary | ICD-10-CM | POA: Diagnosis not present

## 2023-11-08 DIAGNOSIS — D509 Iron deficiency anemia, unspecified: Secondary | ICD-10-CM | POA: Diagnosis not present

## 2023-11-08 DIAGNOSIS — D631 Anemia in chronic kidney disease: Secondary | ICD-10-CM | POA: Diagnosis not present

## 2023-11-08 DIAGNOSIS — R52 Pain, unspecified: Secondary | ICD-10-CM | POA: Diagnosis not present

## 2023-11-08 DIAGNOSIS — D689 Coagulation defect, unspecified: Secondary | ICD-10-CM | POA: Diagnosis not present

## 2023-11-08 DIAGNOSIS — Z992 Dependence on renal dialysis: Secondary | ICD-10-CM | POA: Diagnosis not present

## 2023-11-08 DIAGNOSIS — N186 End stage renal disease: Secondary | ICD-10-CM | POA: Diagnosis not present

## 2023-11-10 ENCOUNTER — Encounter (HOSPITAL_COMMUNITY): Payer: Self-pay

## 2023-11-10 DIAGNOSIS — D689 Coagulation defect, unspecified: Secondary | ICD-10-CM | POA: Diagnosis not present

## 2023-11-10 DIAGNOSIS — D631 Anemia in chronic kidney disease: Secondary | ICD-10-CM | POA: Diagnosis not present

## 2023-11-10 DIAGNOSIS — R52 Pain, unspecified: Secondary | ICD-10-CM | POA: Diagnosis not present

## 2023-11-10 DIAGNOSIS — R197 Diarrhea, unspecified: Secondary | ICD-10-CM | POA: Diagnosis not present

## 2023-11-10 DIAGNOSIS — N186 End stage renal disease: Secondary | ICD-10-CM | POA: Diagnosis not present

## 2023-11-10 DIAGNOSIS — N2581 Secondary hyperparathyroidism of renal origin: Secondary | ICD-10-CM | POA: Diagnosis not present

## 2023-11-10 DIAGNOSIS — Z992 Dependence on renal dialysis: Secondary | ICD-10-CM | POA: Diagnosis not present

## 2023-11-10 DIAGNOSIS — D509 Iron deficiency anemia, unspecified: Secondary | ICD-10-CM | POA: Diagnosis not present

## 2023-11-10 DIAGNOSIS — T7840XA Allergy, unspecified, initial encounter: Secondary | ICD-10-CM | POA: Diagnosis not present

## 2023-11-13 DIAGNOSIS — R197 Diarrhea, unspecified: Secondary | ICD-10-CM | POA: Diagnosis not present

## 2023-11-13 DIAGNOSIS — D631 Anemia in chronic kidney disease: Secondary | ICD-10-CM | POA: Diagnosis not present

## 2023-11-13 DIAGNOSIS — Z992 Dependence on renal dialysis: Secondary | ICD-10-CM | POA: Diagnosis not present

## 2023-11-13 DIAGNOSIS — D689 Coagulation defect, unspecified: Secondary | ICD-10-CM | POA: Diagnosis not present

## 2023-11-13 DIAGNOSIS — N2581 Secondary hyperparathyroidism of renal origin: Secondary | ICD-10-CM | POA: Diagnosis not present

## 2023-11-13 DIAGNOSIS — D509 Iron deficiency anemia, unspecified: Secondary | ICD-10-CM | POA: Diagnosis not present

## 2023-11-13 DIAGNOSIS — N186 End stage renal disease: Secondary | ICD-10-CM | POA: Diagnosis not present

## 2023-11-13 DIAGNOSIS — R52 Pain, unspecified: Secondary | ICD-10-CM | POA: Diagnosis not present

## 2023-11-13 DIAGNOSIS — T7840XA Allergy, unspecified, initial encounter: Secondary | ICD-10-CM | POA: Diagnosis not present

## 2023-11-15 DIAGNOSIS — N2581 Secondary hyperparathyroidism of renal origin: Secondary | ICD-10-CM | POA: Diagnosis not present

## 2023-11-15 DIAGNOSIS — D631 Anemia in chronic kidney disease: Secondary | ICD-10-CM | POA: Diagnosis not present

## 2023-11-15 DIAGNOSIS — D689 Coagulation defect, unspecified: Secondary | ICD-10-CM | POA: Diagnosis not present

## 2023-11-15 DIAGNOSIS — Z992 Dependence on renal dialysis: Secondary | ICD-10-CM | POA: Diagnosis not present

## 2023-11-15 DIAGNOSIS — R197 Diarrhea, unspecified: Secondary | ICD-10-CM | POA: Diagnosis not present

## 2023-11-15 DIAGNOSIS — D509 Iron deficiency anemia, unspecified: Secondary | ICD-10-CM | POA: Diagnosis not present

## 2023-11-15 DIAGNOSIS — N186 End stage renal disease: Secondary | ICD-10-CM | POA: Diagnosis not present

## 2023-11-15 DIAGNOSIS — T7840XA Allergy, unspecified, initial encounter: Secondary | ICD-10-CM | POA: Diagnosis not present

## 2023-11-15 DIAGNOSIS — R52 Pain, unspecified: Secondary | ICD-10-CM | POA: Diagnosis not present

## 2023-11-16 ENCOUNTER — Encounter: Payer: Self-pay | Admitting: Family Medicine

## 2023-11-16 ENCOUNTER — Ambulatory Visit: Admitting: Family Medicine

## 2023-11-16 VITALS — BP 140/80 | HR 74 | Ht 71.0 in | Wt 283.4 lb

## 2023-11-16 DIAGNOSIS — M542 Cervicalgia: Secondary | ICD-10-CM

## 2023-11-16 DIAGNOSIS — N185 Chronic kidney disease, stage 5: Secondary | ICD-10-CM | POA: Diagnosis not present

## 2023-11-16 DIAGNOSIS — J0100 Acute maxillary sinusitis, unspecified: Secondary | ICD-10-CM

## 2023-11-16 MED ORDER — AMOXICILLIN-POT CLAVULANATE 875-125 MG PO TABS: 1.0000 | ORAL_TABLET | Freq: Two times a day (BID) | ORAL | 0 refills | Status: AC

## 2023-11-16 NOTE — Progress Notes (Signed)
   Subjective:    Patient ID: Devon Cook, male    DOB: [birth year], 63 y.o.   MRN: [id number]  HPI He is here for consult concerning a couple of issues.  He continues have difficulty with neck pain.  He states that the pain does radiate down his left arm into his hands but is unsure as to which fingers or goes to.  He did have recent neck x-rays and has already set up an appointment to see Dr. Hiram Lukes at [facility] for follow-up on this.  He has been taking Tylenol  PM.  He is in dialysis and is reluctant to try any other medications and did not do well on tramadol  in the past. He also complains of a several month history of nasal congestion postnasal drainage, frontal headache, upper tooth discomfort but no fever chills or earache and only occasional slight sore throat.   Review of Systems     Objective:    Physical Exam Exam of the left arm shows normal strength and reflexes.  Sensation appears normal. Tympanic membranes normal with slight trauma to the left canal due to mechanical use of Q-tip.  Nasal mucosa is normal.  Tender over frontal and maxillary sinuses.       Assessment & Plan:  Neck pain  Acute maxillary sinusitis, recurrence not specified - Plan: amoxicillin -clavulanate (AUGMENTIN ) 875-125 MG tablet  Chronic renal failure, stage 5 (HCC) He is to call me if not entirely better when he finishes the antibiotic.  Caution to not stop until he is totally back to normal. Discussed the use of an NSAID however he will discuss this further with dialysis.

## 2023-11-17 DIAGNOSIS — N2581 Secondary hyperparathyroidism of renal origin: Secondary | ICD-10-CM | POA: Diagnosis not present

## 2023-11-17 DIAGNOSIS — T7840XA Allergy, unspecified, initial encounter: Secondary | ICD-10-CM | POA: Diagnosis not present

## 2023-11-17 DIAGNOSIS — N186 End stage renal disease: Secondary | ICD-10-CM | POA: Diagnosis not present

## 2023-11-17 DIAGNOSIS — Z992 Dependence on renal dialysis: Secondary | ICD-10-CM | POA: Diagnosis not present

## 2023-11-17 DIAGNOSIS — D689 Coagulation defect, unspecified: Secondary | ICD-10-CM | POA: Diagnosis not present

## 2023-11-17 DIAGNOSIS — R52 Pain, unspecified: Secondary | ICD-10-CM | POA: Diagnosis not present

## 2023-11-17 DIAGNOSIS — D509 Iron deficiency anemia, unspecified: Secondary | ICD-10-CM | POA: Diagnosis not present

## 2023-11-17 DIAGNOSIS — D631 Anemia in chronic kidney disease: Secondary | ICD-10-CM | POA: Diagnosis not present

## 2023-11-17 DIAGNOSIS — R197 Diarrhea, unspecified: Secondary | ICD-10-CM | POA: Diagnosis not present

## 2023-11-20 DIAGNOSIS — R52 Pain, unspecified: Secondary | ICD-10-CM | POA: Diagnosis not present

## 2023-11-20 DIAGNOSIS — D689 Coagulation defect, unspecified: Secondary | ICD-10-CM | POA: Diagnosis not present

## 2023-11-20 DIAGNOSIS — N2581 Secondary hyperparathyroidism of renal origin: Secondary | ICD-10-CM | POA: Diagnosis not present

## 2023-11-20 DIAGNOSIS — D631 Anemia in chronic kidney disease: Secondary | ICD-10-CM | POA: Diagnosis not present

## 2023-11-20 DIAGNOSIS — Z992 Dependence on renal dialysis: Secondary | ICD-10-CM | POA: Diagnosis not present

## 2023-11-20 DIAGNOSIS — R197 Diarrhea, unspecified: Secondary | ICD-10-CM | POA: Diagnosis not present

## 2023-11-20 DIAGNOSIS — T7840XA Allergy, unspecified, initial encounter: Secondary | ICD-10-CM | POA: Diagnosis not present

## 2023-11-20 DIAGNOSIS — N186 End stage renal disease: Secondary | ICD-10-CM | POA: Diagnosis not present

## 2023-11-20 DIAGNOSIS — D509 Iron deficiency anemia, unspecified: Secondary | ICD-10-CM | POA: Diagnosis not present

## 2023-11-21 NOTE — Telephone Encounter (Signed)
 Did reach out to Firman Hughes RN Supervisor about this message.  Sherline Distel RN Supervisor did speak with Dr. Lavonne Prairie about this matter, and Dr. Lavonne Prairie offered to see this pt for establishment.   Per Kaye Parsons, our office will reach out to the pt to offer next available appt with Dr. Lavonne Prairie.   Did return a call back to the pt to endorse this plan and offer next available appt with Dr. Lavonne Prairie to establish with.   Pt states he can only attend appts on Tuesdays and Thursdays, for he has dialysis on Mon, Wed, and Fridays.  Scheduled the pt to see Dr. Lavonne Prairie in the office of 12/21/23 at 0820.  Pt aware of our new office location/address.  Advised the pt to arrive 20 mins prior to this appt.  Pt verbalized understanding and agrees with this plan.  Pt was more than gracious for all the assistance provided.

## 2023-11-21 NOTE — Telephone Encounter (Signed)
 Pt calling in to f/u on provider switch, requesting cb

## 2023-11-22 DIAGNOSIS — T7840XA Allergy, unspecified, initial encounter: Secondary | ICD-10-CM | POA: Diagnosis not present

## 2023-11-22 DIAGNOSIS — Z992 Dependence on renal dialysis: Secondary | ICD-10-CM | POA: Diagnosis not present

## 2023-11-22 DIAGNOSIS — R197 Diarrhea, unspecified: Secondary | ICD-10-CM | POA: Diagnosis not present

## 2023-11-22 DIAGNOSIS — D631 Anemia in chronic kidney disease: Secondary | ICD-10-CM | POA: Diagnosis not present

## 2023-11-22 DIAGNOSIS — N186 End stage renal disease: Secondary | ICD-10-CM | POA: Diagnosis not present

## 2023-11-22 DIAGNOSIS — R52 Pain, unspecified: Secondary | ICD-10-CM | POA: Diagnosis not present

## 2023-11-22 DIAGNOSIS — D509 Iron deficiency anemia, unspecified: Secondary | ICD-10-CM | POA: Diagnosis not present

## 2023-11-22 DIAGNOSIS — D689 Coagulation defect, unspecified: Secondary | ICD-10-CM | POA: Diagnosis not present

## 2023-11-22 DIAGNOSIS — N2581 Secondary hyperparathyroidism of renal origin: Secondary | ICD-10-CM | POA: Diagnosis not present

## 2023-11-24 DIAGNOSIS — R52 Pain, unspecified: Secondary | ICD-10-CM | POA: Diagnosis not present

## 2023-11-24 DIAGNOSIS — T7840XA Allergy, unspecified, initial encounter: Secondary | ICD-10-CM | POA: Diagnosis not present

## 2023-11-24 DIAGNOSIS — N2581 Secondary hyperparathyroidism of renal origin: Secondary | ICD-10-CM | POA: Diagnosis not present

## 2023-11-24 DIAGNOSIS — D509 Iron deficiency anemia, unspecified: Secondary | ICD-10-CM | POA: Diagnosis not present

## 2023-11-24 DIAGNOSIS — R197 Diarrhea, unspecified: Secondary | ICD-10-CM | POA: Diagnosis not present

## 2023-11-24 DIAGNOSIS — D631 Anemia in chronic kidney disease: Secondary | ICD-10-CM | POA: Diagnosis not present

## 2023-11-24 DIAGNOSIS — D689 Coagulation defect, unspecified: Secondary | ICD-10-CM | POA: Diagnosis not present

## 2023-11-24 DIAGNOSIS — N186 End stage renal disease: Secondary | ICD-10-CM | POA: Diagnosis not present

## 2023-11-24 DIAGNOSIS — Z992 Dependence on renal dialysis: Secondary | ICD-10-CM | POA: Diagnosis not present

## 2023-11-27 DIAGNOSIS — D631 Anemia in chronic kidney disease: Secondary | ICD-10-CM | POA: Diagnosis not present

## 2023-11-27 DIAGNOSIS — R52 Pain, unspecified: Secondary | ICD-10-CM | POA: Diagnosis not present

## 2023-11-27 DIAGNOSIS — D509 Iron deficiency anemia, unspecified: Secondary | ICD-10-CM | POA: Diagnosis not present

## 2023-11-27 DIAGNOSIS — Z992 Dependence on renal dialysis: Secondary | ICD-10-CM | POA: Diagnosis not present

## 2023-11-27 DIAGNOSIS — D689 Coagulation defect, unspecified: Secondary | ICD-10-CM | POA: Diagnosis not present

## 2023-11-27 DIAGNOSIS — R197 Diarrhea, unspecified: Secondary | ICD-10-CM | POA: Diagnosis not present

## 2023-11-27 DIAGNOSIS — N186 End stage renal disease: Secondary | ICD-10-CM | POA: Diagnosis not present

## 2023-11-27 DIAGNOSIS — T7840XA Allergy, unspecified, initial encounter: Secondary | ICD-10-CM | POA: Diagnosis not present

## 2023-11-27 DIAGNOSIS — N2581 Secondary hyperparathyroidism of renal origin: Secondary | ICD-10-CM | POA: Diagnosis not present

## 2023-11-28 ENCOUNTER — Encounter

## 2023-11-28 ENCOUNTER — Ambulatory Visit: Payer: Self-pay

## 2023-11-28 NOTE — Telephone Encounter (Signed)
 Chief Complaint: diarrhea/vomiting on abx Symptoms: diarrhea/vomiting Frequency: x 24 hours Pertinent Negatives: Patient denies fever/bloody stools Disposition: [] ED /[] Urgent Care (no appt availability in office) / [] Appointment(In office/virtual)/ []  [facility]/ [] Home Care/ [] Refused Recommended Disposition /[] [facility]/ []  Follow-up with PCP Additional Notes: patient does not feel like he will make it to PCP/UC due to diarrhea. He will proceed to UC if symptoms worsen today.  Dialysis patient with dialysis 5/28 at 5 am. Augmentin  prescribed 5/15, began taking on 5/22. Patient stopped his abx as GI symptoms developed. Continues to have sinus symptoms and would consider a different antibiotic.     Copied From CRM [id number].     Reason for Triage: Patient was seen in off on 5/15 and given Augmentin  for sinusitis. Patient states that he has now been experiencing vomiting and diarrhea for 2 days.  Reason for Disposition  [1] MODERATE vomiting (e.g., 3 - 5 times/day) AND [2] age > 60 years  MODERATE diarrhea (e.g., 4-6 times / day more than normal)  Answer Assessment - Initial Assessment Questions 1. VOMITING SEVERITY: "How many times have you vomited in the past 24 hours?"     - MILD:  1 - 2 times/day    - MODERATE: 3 - 5 times/day, decreased oral intake without significant weight loss or symptoms of dehydration    - SEVERE: 6 or more times/day, vomits everything or nearly everything, with significant weight loss, symptoms of dehydration      Would not assign number, states yesterday morning and all last night. Diarrhea this morning and dry heaving 2. ONSET: "When did the vomiting begin?"      One day 3. FLUIDS: "What fluids or food have you vomited up today?" "Have you been able to keep any fluids down?"     States that he is sipping gatorade this morning 4. ABDOMEN PAIN: "Are your having any abdomen pain?" If Yes : "How bad is it and what does it feel like?"  (e.g., crampy, dull, intermittent, constant)      States that he feels like his stomach is on fire 5. DIARRHEA: "Is there any diarrhea?" If Yes, ask: "How many times today?"      X1 today 6. CONTACTS: "Is there anyone else in the family with the same symptoms?"      no 7. CAUSE: "What do you think is causing your vomiting?"     Antibiotic, only had a couple of doses 8. HYDRATION STATUS: "Any signs of dehydration?" (e.g., dry mouth [not only dry lips], too weak to stand) "When did you last urinate?"     Dialysis patient but reports increased urination 9. OTHER SYMPTOMS: "Do you have any other symptoms?" (e.g., fever, headache, vertigo, vomiting blood or coffee grounds, recent head injury)     no  Answer Assessment - Initial Assessment Questions 1. ANTIBIOTIC: "What antibiotic are you taking?" "How many times per day?"     Augmentin , twice daily. Has only had two doses 2. ANTIBIOTIC ONSET: "When was the antibiotic started?"     Thursday 3. DIARRHEA SEVERITY: "How bad is the diarrhea?" "How many more stools have you had in the past 24 hours than normal?"    - NO DIARRHEA (SCALE 0)   - MILD (SCALE 1-3): Few loose or mushy BMs; increase of 1-3 stools over normal daily number of stools; mild increase in ostomy output.   -  MODERATE (SCALE 4-7): Increase of 4-6 stools daily over normal; moderate increase in ostomy output. *  SEVERE (SCALE 8-10; OR 'WORST POSSIBLE'): Increase of 7 or more stools daily over normal; moderate increase in ostomy output; incontinence.     No specific number given 4. ONSET: "When did the diarrhea begin?"      today 5. BM CONSISTENCY: "How loose or watery is the diarrhea?"      watery 6. VOMITING: "Are you also vomiting?" If Yes, ask: "How many times in the past 24 hours?"      Yes, many times in last 24 hours 7. ABDOMEN PAIN: "Are you having any abdomen pain?" If Yes, ask: "What does it feel like?" (e.g., crampy, dull, intermittent, constant)      Yes, burning 8.  ABDOMEN PAIN SEVERITY: If present, ask: "How bad is the pain?"  (e.g., Scale 1-10; mild, moderate, or severe)   - MILD (1-3): doesn't interfere with normal activities, abdomen soft and not tender to touch    - MODERATE (4-7): interferes with normal activities or awakens from sleep, abdomen tender to touch    - SEVERE (8-10): excruciating pain, doubled over, unable to do any normal activities       N/a 9. ORAL INTAKE: If vomiting, "Have you been able to drink liquids?" "How much liquids have you had in the past 24 hours?"     Drinking gatorade, unable to tolerate solid food 10. HYDRATION: "Any signs of dehydration?" (e.g., dry mouth [not just dry lips], too weak to stand, dizziness, new weight loss) "When did you last urinate?"       Dialysis patient, reports increased urination  Protocols used: Vomiting-A-AH, Diarrhea on Antibiotics-A-AH

## 2023-11-29 DIAGNOSIS — N186 End stage renal disease: Secondary | ICD-10-CM | POA: Diagnosis not present

## 2023-11-29 DIAGNOSIS — D509 Iron deficiency anemia, unspecified: Secondary | ICD-10-CM | POA: Diagnosis not present

## 2023-11-29 DIAGNOSIS — T7840XA Allergy, unspecified, initial encounter: Secondary | ICD-10-CM | POA: Diagnosis not present

## 2023-11-29 DIAGNOSIS — N2581 Secondary hyperparathyroidism of renal origin: Secondary | ICD-10-CM | POA: Diagnosis not present

## 2023-11-29 DIAGNOSIS — Z992 Dependence on renal dialysis: Secondary | ICD-10-CM | POA: Diagnosis not present

## 2023-11-29 DIAGNOSIS — D631 Anemia in chronic kidney disease: Secondary | ICD-10-CM | POA: Diagnosis not present

## 2023-11-29 DIAGNOSIS — D689 Coagulation defect, unspecified: Secondary | ICD-10-CM | POA: Diagnosis not present

## 2023-11-29 DIAGNOSIS — R197 Diarrhea, unspecified: Secondary | ICD-10-CM | POA: Diagnosis not present

## 2023-11-29 DIAGNOSIS — R52 Pain, unspecified: Secondary | ICD-10-CM | POA: Diagnosis not present

## 2023-11-30 ENCOUNTER — Encounter (HOSPITAL_COMMUNITY): Payer: Self-pay

## 2023-12-01 DIAGNOSIS — N2581 Secondary hyperparathyroidism of renal origin: Secondary | ICD-10-CM | POA: Diagnosis not present

## 2023-12-01 DIAGNOSIS — R52 Pain, unspecified: Secondary | ICD-10-CM | POA: Diagnosis not present

## 2023-12-01 DIAGNOSIS — R197 Diarrhea, unspecified: Secondary | ICD-10-CM | POA: Diagnosis not present

## 2023-12-01 DIAGNOSIS — T7840XA Allergy, unspecified, initial encounter: Secondary | ICD-10-CM | POA: Diagnosis not present

## 2023-12-01 DIAGNOSIS — D631 Anemia in chronic kidney disease: Secondary | ICD-10-CM | POA: Diagnosis not present

## 2023-12-01 DIAGNOSIS — Z992 Dependence on renal dialysis: Secondary | ICD-10-CM | POA: Diagnosis not present

## 2023-12-01 DIAGNOSIS — D509 Iron deficiency anemia, unspecified: Secondary | ICD-10-CM | POA: Diagnosis not present

## 2023-12-01 DIAGNOSIS — D689 Coagulation defect, unspecified: Secondary | ICD-10-CM | POA: Diagnosis not present

## 2023-12-01 DIAGNOSIS — N186 End stage renal disease: Secondary | ICD-10-CM | POA: Diagnosis not present

## 2023-12-02 DIAGNOSIS — Z992 Dependence on renal dialysis: Secondary | ICD-10-CM | POA: Diagnosis not present

## 2023-12-02 DIAGNOSIS — N186 End stage renal disease: Secondary | ICD-10-CM | POA: Diagnosis not present

## 2023-12-02 DIAGNOSIS — N138 Other obstructive and reflux uropathy: Secondary | ICD-10-CM | POA: Diagnosis not present

## 2023-12-04 DIAGNOSIS — N186 End stage renal disease: Secondary | ICD-10-CM | POA: Diagnosis not present

## 2023-12-04 DIAGNOSIS — N2581 Secondary hyperparathyroidism of renal origin: Secondary | ICD-10-CM | POA: Diagnosis not present

## 2023-12-04 DIAGNOSIS — D689 Coagulation defect, unspecified: Secondary | ICD-10-CM | POA: Diagnosis not present

## 2023-12-04 DIAGNOSIS — D509 Iron deficiency anemia, unspecified: Secondary | ICD-10-CM | POA: Diagnosis not present

## 2023-12-04 DIAGNOSIS — R197 Diarrhea, unspecified: Secondary | ICD-10-CM | POA: Diagnosis not present

## 2023-12-04 DIAGNOSIS — T7840XA Allergy, unspecified, initial encounter: Secondary | ICD-10-CM | POA: Diagnosis not present

## 2023-12-04 DIAGNOSIS — R52 Pain, unspecified: Secondary | ICD-10-CM | POA: Diagnosis not present

## 2023-12-04 DIAGNOSIS — D631 Anemia in chronic kidney disease: Secondary | ICD-10-CM | POA: Diagnosis not present

## 2023-12-04 DIAGNOSIS — Z992 Dependence on renal dialysis: Secondary | ICD-10-CM | POA: Diagnosis not present

## 2023-12-06 DIAGNOSIS — D509 Iron deficiency anemia, unspecified: Secondary | ICD-10-CM | POA: Diagnosis not present

## 2023-12-06 DIAGNOSIS — R197 Diarrhea, unspecified: Secondary | ICD-10-CM | POA: Diagnosis not present

## 2023-12-06 DIAGNOSIS — D689 Coagulation defect, unspecified: Secondary | ICD-10-CM | POA: Diagnosis not present

## 2023-12-06 DIAGNOSIS — M542 Cervicalgia: Secondary | ICD-10-CM | POA: Diagnosis not present

## 2023-12-06 DIAGNOSIS — T7840XA Allergy, unspecified, initial encounter: Secondary | ICD-10-CM | POA: Diagnosis not present

## 2023-12-06 DIAGNOSIS — N186 End stage renal disease: Secondary | ICD-10-CM | POA: Diagnosis not present

## 2023-12-06 DIAGNOSIS — N2581 Secondary hyperparathyroidism of renal origin: Secondary | ICD-10-CM | POA: Diagnosis not present

## 2023-12-06 DIAGNOSIS — M5412 Radiculopathy, cervical region: Secondary | ICD-10-CM | POA: Diagnosis not present

## 2023-12-06 DIAGNOSIS — D631 Anemia in chronic kidney disease: Secondary | ICD-10-CM | POA: Diagnosis not present

## 2023-12-06 DIAGNOSIS — R52 Pain, unspecified: Secondary | ICD-10-CM | POA: Diagnosis not present

## 2023-12-06 DIAGNOSIS — Z992 Dependence on renal dialysis: Secondary | ICD-10-CM | POA: Diagnosis not present

## 2023-12-07 ENCOUNTER — Telehealth: Payer: Self-pay | Admitting: Family Medicine

## 2023-12-07 ENCOUNTER — Telehealth: Admitting: Family Medicine

## 2023-12-07 NOTE — Progress Notes (Signed)
 Not seen

## 2023-12-07 NOTE — Telephone Encounter (Signed)
 Dr. Robina Chol..  I tried calling last week .. I got the answering service.. they stated they would contact the office .Devon Cook  They Amoxicillin  made me sick...  Need something else that might help with sinuses..  Thank You

## 2023-12-08 DIAGNOSIS — Z992 Dependence on renal dialysis: Secondary | ICD-10-CM | POA: Diagnosis not present

## 2023-12-08 DIAGNOSIS — R52 Pain, unspecified: Secondary | ICD-10-CM | POA: Diagnosis not present

## 2023-12-08 DIAGNOSIS — D509 Iron deficiency anemia, unspecified: Secondary | ICD-10-CM | POA: Diagnosis not present

## 2023-12-08 DIAGNOSIS — N2581 Secondary hyperparathyroidism of renal origin: Secondary | ICD-10-CM | POA: Diagnosis not present

## 2023-12-08 DIAGNOSIS — D631 Anemia in chronic kidney disease: Secondary | ICD-10-CM | POA: Diagnosis not present

## 2023-12-08 DIAGNOSIS — D689 Coagulation defect, unspecified: Secondary | ICD-10-CM | POA: Diagnosis not present

## 2023-12-08 DIAGNOSIS — N186 End stage renal disease: Secondary | ICD-10-CM | POA: Diagnosis not present

## 2023-12-08 DIAGNOSIS — T7840XA Allergy, unspecified, initial encounter: Secondary | ICD-10-CM | POA: Diagnosis not present

## 2023-12-08 DIAGNOSIS — R197 Diarrhea, unspecified: Secondary | ICD-10-CM | POA: Diagnosis not present

## 2023-12-11 DIAGNOSIS — T7840XA Allergy, unspecified, initial encounter: Secondary | ICD-10-CM | POA: Diagnosis not present

## 2023-12-11 DIAGNOSIS — D689 Coagulation defect, unspecified: Secondary | ICD-10-CM | POA: Diagnosis not present

## 2023-12-11 DIAGNOSIS — N2581 Secondary hyperparathyroidism of renal origin: Secondary | ICD-10-CM | POA: Diagnosis not present

## 2023-12-11 DIAGNOSIS — N186 End stage renal disease: Secondary | ICD-10-CM | POA: Diagnosis not present

## 2023-12-11 DIAGNOSIS — R52 Pain, unspecified: Secondary | ICD-10-CM | POA: Diagnosis not present

## 2023-12-11 DIAGNOSIS — Z992 Dependence on renal dialysis: Secondary | ICD-10-CM | POA: Diagnosis not present

## 2023-12-11 DIAGNOSIS — D631 Anemia in chronic kidney disease: Secondary | ICD-10-CM | POA: Diagnosis not present

## 2023-12-11 DIAGNOSIS — D509 Iron deficiency anemia, unspecified: Secondary | ICD-10-CM | POA: Diagnosis not present

## 2023-12-11 DIAGNOSIS — R197 Diarrhea, unspecified: Secondary | ICD-10-CM | POA: Diagnosis not present

## 2023-12-13 ENCOUNTER — Telehealth: Payer: Self-pay

## 2023-12-13 ENCOUNTER — Encounter: Payer: Self-pay | Admitting: Family Medicine

## 2023-12-13 DIAGNOSIS — D509 Iron deficiency anemia, unspecified: Secondary | ICD-10-CM | POA: Diagnosis not present

## 2023-12-13 DIAGNOSIS — T7840XA Allergy, unspecified, initial encounter: Secondary | ICD-10-CM | POA: Diagnosis not present

## 2023-12-13 DIAGNOSIS — N186 End stage renal disease: Secondary | ICD-10-CM | POA: Diagnosis not present

## 2023-12-13 DIAGNOSIS — R52 Pain, unspecified: Secondary | ICD-10-CM | POA: Diagnosis not present

## 2023-12-13 DIAGNOSIS — N2581 Secondary hyperparathyroidism of renal origin: Secondary | ICD-10-CM | POA: Diagnosis not present

## 2023-12-13 DIAGNOSIS — D631 Anemia in chronic kidney disease: Secondary | ICD-10-CM | POA: Diagnosis not present

## 2023-12-13 DIAGNOSIS — D689 Coagulation defect, unspecified: Secondary | ICD-10-CM | POA: Diagnosis not present

## 2023-12-13 DIAGNOSIS — Z992 Dependence on renal dialysis: Secondary | ICD-10-CM | POA: Diagnosis not present

## 2023-12-13 DIAGNOSIS — R197 Diarrhea, unspecified: Secondary | ICD-10-CM | POA: Diagnosis not present

## 2023-12-13 NOTE — Telephone Encounter (Signed)
 Copied from CRM [id number]. Topic: Clinical - Request for Lab/Test Order >> Dec 13, 2023 11:28 AM Rosaria Common wrote: Reason for CRM: Pt is calling to verify if orders for a blood transfusion has been submitted. Called clinic to verify and no orders have been submitted. Advised to send CRM and clinic will give pt a callback at [phone].

## 2023-12-15 DIAGNOSIS — D689 Coagulation defect, unspecified: Secondary | ICD-10-CM | POA: Diagnosis not present

## 2023-12-15 DIAGNOSIS — T7840XA Allergy, unspecified, initial encounter: Secondary | ICD-10-CM | POA: Diagnosis not present

## 2023-12-15 DIAGNOSIS — D631 Anemia in chronic kidney disease: Secondary | ICD-10-CM | POA: Diagnosis not present

## 2023-12-15 DIAGNOSIS — D509 Iron deficiency anemia, unspecified: Secondary | ICD-10-CM | POA: Diagnosis not present

## 2023-12-15 DIAGNOSIS — N186 End stage renal disease: Secondary | ICD-10-CM | POA: Diagnosis not present

## 2023-12-15 DIAGNOSIS — N2581 Secondary hyperparathyroidism of renal origin: Secondary | ICD-10-CM | POA: Diagnosis not present

## 2023-12-15 DIAGNOSIS — R197 Diarrhea, unspecified: Secondary | ICD-10-CM | POA: Diagnosis not present

## 2023-12-15 DIAGNOSIS — R52 Pain, unspecified: Secondary | ICD-10-CM | POA: Diagnosis not present

## 2023-12-15 DIAGNOSIS — Z992 Dependence on renal dialysis: Secondary | ICD-10-CM | POA: Diagnosis not present

## 2023-12-18 DIAGNOSIS — T7840XA Allergy, unspecified, initial encounter: Secondary | ICD-10-CM | POA: Diagnosis not present

## 2023-12-18 DIAGNOSIS — R52 Pain, unspecified: Secondary | ICD-10-CM | POA: Diagnosis not present

## 2023-12-18 DIAGNOSIS — D509 Iron deficiency anemia, unspecified: Secondary | ICD-10-CM | POA: Diagnosis not present

## 2023-12-18 DIAGNOSIS — N186 End stage renal disease: Secondary | ICD-10-CM | POA: Diagnosis not present

## 2023-12-18 DIAGNOSIS — R197 Diarrhea, unspecified: Secondary | ICD-10-CM | POA: Diagnosis not present

## 2023-12-18 DIAGNOSIS — Z992 Dependence on renal dialysis: Secondary | ICD-10-CM | POA: Diagnosis not present

## 2023-12-18 DIAGNOSIS — N2581 Secondary hyperparathyroidism of renal origin: Secondary | ICD-10-CM | POA: Diagnosis not present

## 2023-12-18 DIAGNOSIS — D631 Anemia in chronic kidney disease: Secondary | ICD-10-CM | POA: Diagnosis not present

## 2023-12-18 DIAGNOSIS — D689 Coagulation defect, unspecified: Secondary | ICD-10-CM | POA: Diagnosis not present

## 2023-12-20 DIAGNOSIS — R52 Pain, unspecified: Secondary | ICD-10-CM | POA: Diagnosis not present

## 2023-12-20 DIAGNOSIS — D689 Coagulation defect, unspecified: Secondary | ICD-10-CM | POA: Diagnosis not present

## 2023-12-20 DIAGNOSIS — T7840XA Allergy, unspecified, initial encounter: Secondary | ICD-10-CM | POA: Diagnosis not present

## 2023-12-20 DIAGNOSIS — D509 Iron deficiency anemia, unspecified: Secondary | ICD-10-CM | POA: Diagnosis not present

## 2023-12-20 DIAGNOSIS — R197 Diarrhea, unspecified: Secondary | ICD-10-CM | POA: Diagnosis not present

## 2023-12-20 DIAGNOSIS — I7121 Aneurysm of the ascending aorta, without rupture: Secondary | ICD-10-CM | POA: Insufficient documentation

## 2023-12-20 DIAGNOSIS — Z992 Dependence on renal dialysis: Secondary | ICD-10-CM | POA: Diagnosis not present

## 2023-12-20 DIAGNOSIS — I480 Paroxysmal atrial fibrillation: Secondary | ICD-10-CM | POA: Insufficient documentation

## 2023-12-20 DIAGNOSIS — N186 End stage renal disease: Secondary | ICD-10-CM | POA: Diagnosis not present

## 2023-12-20 DIAGNOSIS — D631 Anemia in chronic kidney disease: Secondary | ICD-10-CM | POA: Diagnosis not present

## 2023-12-20 DIAGNOSIS — I5022 Chronic systolic (congestive) heart failure: Secondary | ICD-10-CM | POA: Insufficient documentation

## 2023-12-20 DIAGNOSIS — N2581 Secondary hyperparathyroidism of renal origin: Secondary | ICD-10-CM | POA: Diagnosis not present

## 2023-12-20 NOTE — Progress Notes (Unsigned)
 Cardiology Office Note:   Date:  12/21/2023  ID:  Devon Cook, DOB 07-31-1960, MRN [id number] PCP: Watson Hacking, MD  [facility] Providers Cardiologist:  Sonny Dust, MD (Inactive) {  History of Present Illness:   Devon Cook is a 63 y.o. male for his first visit with me.  He was previously seen by Dr. Ardell Beauvais.Aaron Aas  He has been seen for chest pain.  He has had some aortic dilatation with it being 42 mm in 2021.  He has had moderately reduced ejection fraction with an EF of 45 to 50%.  He has had PAF but had this only 1 time and was low embolic risk and so has not been on a DOAC.  He returns as a new patient for me.  He feels tired all the time.  Turns out he has got a hemoglobin around 8.  He also has sleep apnea and cannot use a CPAP.  He is dialysis dependent.  He also has prostate cancer he is treated.  He still works as a Designer, multimedia at [employer].  He is not describing chest pressure, neck or arm discomfort.  He really does not know that he is in fibrillation today.  He does not think he can tell a difference.  He is not really noticing any palpitations, presyncope or syncope.  He has had no weight gain or edema.  His blood pressure fluctuates but it is regimen right now seems to be keeping it in the 08/03/1938 systolic range with diastolics in the 80s to 90s.  Other manipulations of made his blood pressure dropped really low before.  ROS: As stated in the HPI and negative for all other systems.  Studies Reviewed:    EKG:   EKG Interpretation Date/Time:  Thursday December 21 2023 07:59:07 EDT Ventricular Rate:  92 PR Interval:    QRS Duration:  94 QT Interval:  362 QTC Calculation: 447 R Axis:   -40  Text Interpretation: Atrial fibrillation Left axis deviation Anterior infarct , age undetermined When compared with ECG of 14-Jan-2023 04:41, Atrial fibrillation has replaced Sinus rhythm Confirmed by Eilleen Grates ([id number]) on 12/21/2023 8:01:05 AM     Risk  Assessment/Calculations:    CHA2DS2-VASc Score = 2   This indicates a 2.2% annual risk of stroke. The patient's score is based upon: CHF History: 1 HTN History: 1 Diabetes History: 0 Stroke History: 0 Vascular Disease History: 0 Age Score: 0 Gender Score: 0    Physical Exam:   VS:  BP (!) 144/96   Pulse 92   Ht 6' 1 (1.854 m)   Wt 284 lb 6.4 oz (129 kg)   SpO2 97%   BMI 37.52 kg/m    Wt Readings from Last 3 Encounters:  12/21/23 284 lb 6.4 oz (129 kg)  11/16/23 283 lb 6.4 oz (128.5 kg)  08/22/23 289 lb 6.4 oz (131.3 kg)     GEN: Well nourished, well developed in no acute distress NECK: No JVD; No carotid bruits CARDIAC: Irregular RR, no murmurs, rubs, gallops RESPIRATORY:  Clear to auscultation without rales, wheezing or rhonchi  ABDOMEN: Soft, non-tender, non-distended EXTREMITIES:  No edema; No deformity   ASSESSMENT AND PLAN:      Aortic dilatation: He needs imaging with CT as this has not been evaluated since 2021 and it was 42 mm.  However, since her check an echocardiogram I will check his aortic root size this way and do a CT next time.  HFmrEF: LVEF 45 to 50%.  He has not had an echo since 2021 and so I will repeat one   PAF: He needs to be on DOAC but I am checking with Dr. Ralph Burke who is his nephrologist since he is more anemic than usual.  I want a make sure he is cleared to start that.  He seems to have good rate control.  I will apply a 3-day ZIO.   Hypertension:   His blood pressure has been difficult to manage.  This seems to be the best control he is had.  I will not change this regimen.     Sleep apnea: I am going to inquire with Dr. Micael Adas to see if there are any options.  He says he has failed CPAP.  Follow up with me in about 2 months.  Signed, Eilleen Grates, MD

## 2023-12-21 ENCOUNTER — Encounter: Payer: Self-pay | Admitting: Cardiology

## 2023-12-21 ENCOUNTER — Ambulatory Visit: Attending: Cardiology | Admitting: Cardiology

## 2023-12-21 ENCOUNTER — Ambulatory Visit

## 2023-12-21 ENCOUNTER — Telehealth: Payer: Self-pay

## 2023-12-21 VITALS — BP 144/96 | HR 92 | Ht 73.0 in | Wt 284.4 lb

## 2023-12-21 DIAGNOSIS — I10 Essential (primary) hypertension: Secondary | ICD-10-CM

## 2023-12-21 DIAGNOSIS — I5022 Chronic systolic (congestive) heart failure: Secondary | ICD-10-CM

## 2023-12-21 DIAGNOSIS — I429 Cardiomyopathy, unspecified: Secondary | ICD-10-CM

## 2023-12-21 DIAGNOSIS — I480 Paroxysmal atrial fibrillation: Secondary | ICD-10-CM

## 2023-12-21 DIAGNOSIS — R072 Precordial pain: Secondary | ICD-10-CM | POA: Diagnosis not present

## 2023-12-21 DIAGNOSIS — G4733 Obstructive sleep apnea (adult) (pediatric): Secondary | ICD-10-CM

## 2023-12-21 DIAGNOSIS — I7121 Aneurysm of the ascending aorta, without rupture: Secondary | ICD-10-CM | POA: Diagnosis not present

## 2023-12-21 DIAGNOSIS — M542 Cervicalgia: Secondary | ICD-10-CM | POA: Diagnosis not present

## 2023-12-21 NOTE — Telephone Encounter (Signed)
-----   Message from Eilleen Grates sent at 12/21/2023  9:38 AM EDT ----- Devon Cook ----- Message ----- From: Jacqueline Matsu, MD Sent: 12/21/2023   8:41 AM EDT To: Eilleen Grates, MD  So would recommend an in-lab PSG as his last sleep study was 5 years ago and we will need to redocument this before pursuing further therapy.  He did have severe sleep apnea back in 2020 so would likely qualify based on AHI for inspire device but his BMI is borderline so we may need to work on weight loss.  If you want to go ahead and order the in lab PSG will take it from there ----- Message ----- From: Eilleen Grates, MD Sent: 12/21/2023   8:29 AM EDT To: Jacqueline Matsu, MD  This nice patient has sleep apnea and says he has failed CPAP.  I look to get this treated.  Do have to prove he has failed sleep CPAP and does he have to have all the other testing or can we move on to an oral appliance or inspire.  Thanks

## 2023-12-21 NOTE — Progress Notes (Unsigned)
Applied a 3 day Zio XT monitor to patient in the office 

## 2023-12-21 NOTE — Patient Instructions (Signed)
 Medication Instructions:  Your physician recommends that you continue on your current medications as directed. Please refer to the Current Medication list given to you today.  *If you need a refill on your cardiac medications before your next appointment, please call your pharmacy*  Lab Work: NONE If you have labs (blood work) drawn today and your tests are completely normal, you will receive your results only by: MyChart Message (if you have MyChart) OR A paper copy in the mail If you have any lab test that is abnormal or we need to change your treatment, we will call you to review the results.  Testing/Procedures: Echo Your physician has requested that you have an echocardiogram. Echocardiography is a painless test that uses sound waves to create images of your heart. It provides your doctor with information about the size and shape of your heart and how well your heart's chambers and valves are working. This procedure takes approximately one hour. There are no restrictions for this procedure. Please do NOT wear cologne, perfume, aftershave, or lotions (deodorant is allowed). Please arrive 15 minutes prior to your appointment time.  Please note: We ask at that you not bring children with you during ultrasound (echo/ vascular) testing. Due to room size and safety concerns, children are not allowed in the ultrasound rooms during exams. Our front office staff cannot provide observation of children in our lobby area while testing is being conducted. An adult accompanying a patient to their appointment will only be allowed in the ultrasound room at the discretion of the ultrasound technician under special circumstances. We apologize for any inconvenience.   3 Day Zio Heart Monitor Your physician has requested that you wear a Zio heart monitor for 3 days. This will be mailed to your home with instructions on how to apply the monitor and how to return it when finished. Please allow 2 weeks after  returning the heart monitor before our office calls you with the results.   Follow-Up: At [facility], you and your health needs are our priority.  As part of our continuing mission to provide you with exceptional heart care, our providers are all part of one team.  This team includes your primary Cardiologist (physician) and Advanced Practice Providers or APPs (Physician Assistants and Nurse Practitioners) who all work together to provide you with the care you need, when you need it.  Your next appointment:   2 months  Provider:   Lavonne Prairie, MD  We recommend signing up for the patient portal called MyChart.  Sign up information is provided on this After Visit Summary.  MyChart is used to connect with patients for Virtual Visits (Telemedicine).  Patients are able to view lab/test results, encounter notes, upcoming appointments, etc.  Non-urgent messages can be sent to your provider as well.   To learn more about what you can do with MyChart, go to [email or web address].   Other Instructions ZIO XT- Long Term Monitor Instructions  Your physician has requested you wear a ZIO patch monitor for 3 days.  This is a single patch monitor. [employer] supplies one patch monitor per enrollment. Additional stickers are not available. Please do not apply patch if you will be having a Nuclear Stress Test,  Echocardiogram, Cardiac CT, MRI, or Chest Xray during the period you would be wearing the  monitor. The patch cannot be worn during these tests. You cannot remove and re-apply the  ZIO XT patch monitor.  Your ZIO patch monitor will be mailed 3 day USPS  to your address on file. It may take 3-5 days  to receive your monitor after you have been enrolled.  Once you have received your monitor, please review the enclosed instructions. Your monitor  has already been registered assigning a specific monitor serial # to you.  Billing and Patient Assistance Program Information  We have supplied  [employer] with any of your insurance information on file for billing purposes. [employer] offers a sliding scale Patient Assistance Program for patients that do not have  insurance, or whose insurance does not completely cover the cost of the ZIO monitor.  You must apply for the Patient Assistance Program to qualify for this discounted rate.  To apply, please call [employer] at [phone], select option 4, select option 2, ask to apply for  Patient Assistance Program. Sanna Crystal will ask your household income, and how many people  are in your household. They will quote your out-of-pocket cost based on that information.  [employer] will also be able to set up a 5-month, interest-free payment plan if needed.  Applying the monitor   Shave hair from upper left chest.  Hold abrader disc by orange tab. Rub abrader in 40 strokes over the upper left chest as  indicated in your monitor instructions.  Clean area with 4 enclosed alcohol pads. Let dry.  Apply patch as indicated in monitor instructions. Patch will be placed under collarbone on left  side of chest with arrow pointing upward.  Rub patch adhesive wings for 2 minutes. Remove white label marked 1. Remove the white  label marked 2. Rub patch adhesive wings for 2 additional minutes.  While looking in a mirror, press and release button in center of patch. A small green light will  flash 3-4 times. This will be your only indicator that the monitor has been turned on.  Do not shower for the first 24 hours. You may shower after the first 24 hours.  Press the button if you feel a symptom. You will hear a small click. Record Date, Time and  Symptom in the Patient Logbook.  When you are ready to remove the patch, follow instructions on the last 2 pages of Patient  Logbook. Stick patch monitor onto the last page of Patient Logbook.  Place Patient Logbook in the blue and white box. Use locking tab on box and tape box closed  securely. The blue and white box  has prepaid postage on it. Please place it in the mailbox as  soon as possible. Your physician should have your test results approximately 7 days after the  monitor has been mailed back to [facility].  Call [facility] Customer Care at [phone] if you have questions regarding  your ZIO XT patch monitor. Call them immediately if you see an orange light blinking on your  monitor.  If your monitor falls off in less than 4 days, contact our Monitor department at [phone].  If your monitor becomes loose or falls off after 4 days call [employer] at [phone] for  suggestions on securing your monitor

## 2023-12-21 NOTE — Telephone Encounter (Signed)
 Spoke with the pt regarding Dr. Atlas Lea suggestion to have an in lab PSG. An in-lab psg was ordered. Pt aware. Pt verbalized understanding. All questions if any were answered.

## 2023-12-22 DIAGNOSIS — D689 Coagulation defect, unspecified: Secondary | ICD-10-CM | POA: Diagnosis not present

## 2023-12-22 DIAGNOSIS — N186 End stage renal disease: Secondary | ICD-10-CM | POA: Diagnosis not present

## 2023-12-22 DIAGNOSIS — N2581 Secondary hyperparathyroidism of renal origin: Secondary | ICD-10-CM | POA: Diagnosis not present

## 2023-12-22 DIAGNOSIS — T7840XA Allergy, unspecified, initial encounter: Secondary | ICD-10-CM | POA: Diagnosis not present

## 2023-12-22 DIAGNOSIS — D509 Iron deficiency anemia, unspecified: Secondary | ICD-10-CM | POA: Diagnosis not present

## 2023-12-22 DIAGNOSIS — R197 Diarrhea, unspecified: Secondary | ICD-10-CM | POA: Diagnosis not present

## 2023-12-22 DIAGNOSIS — Z992 Dependence on renal dialysis: Secondary | ICD-10-CM | POA: Diagnosis not present

## 2023-12-22 DIAGNOSIS — D631 Anemia in chronic kidney disease: Secondary | ICD-10-CM | POA: Diagnosis not present

## 2023-12-22 DIAGNOSIS — R52 Pain, unspecified: Secondary | ICD-10-CM | POA: Diagnosis not present

## 2023-12-25 DIAGNOSIS — D509 Iron deficiency anemia, unspecified: Secondary | ICD-10-CM | POA: Diagnosis not present

## 2023-12-25 DIAGNOSIS — R52 Pain, unspecified: Secondary | ICD-10-CM | POA: Diagnosis not present

## 2023-12-25 DIAGNOSIS — N186 End stage renal disease: Secondary | ICD-10-CM | POA: Diagnosis not present

## 2023-12-25 DIAGNOSIS — D689 Coagulation defect, unspecified: Secondary | ICD-10-CM | POA: Diagnosis not present

## 2023-12-25 DIAGNOSIS — R197 Diarrhea, unspecified: Secondary | ICD-10-CM | POA: Diagnosis not present

## 2023-12-25 DIAGNOSIS — D631 Anemia in chronic kidney disease: Secondary | ICD-10-CM | POA: Diagnosis not present

## 2023-12-25 DIAGNOSIS — T7840XA Allergy, unspecified, initial encounter: Secondary | ICD-10-CM | POA: Diagnosis not present

## 2023-12-25 DIAGNOSIS — N2581 Secondary hyperparathyroidism of renal origin: Secondary | ICD-10-CM | POA: Diagnosis not present

## 2023-12-25 DIAGNOSIS — Z992 Dependence on renal dialysis: Secondary | ICD-10-CM | POA: Diagnosis not present

## 2023-12-27 DIAGNOSIS — N2581 Secondary hyperparathyroidism of renal origin: Secondary | ICD-10-CM | POA: Diagnosis not present

## 2023-12-27 DIAGNOSIS — D631 Anemia in chronic kidney disease: Secondary | ICD-10-CM | POA: Diagnosis not present

## 2023-12-27 DIAGNOSIS — D509 Iron deficiency anemia, unspecified: Secondary | ICD-10-CM | POA: Diagnosis not present

## 2023-12-27 DIAGNOSIS — Z992 Dependence on renal dialysis: Secondary | ICD-10-CM | POA: Diagnosis not present

## 2023-12-27 DIAGNOSIS — R197 Diarrhea, unspecified: Secondary | ICD-10-CM | POA: Diagnosis not present

## 2023-12-27 DIAGNOSIS — D689 Coagulation defect, unspecified: Secondary | ICD-10-CM | POA: Diagnosis not present

## 2023-12-27 DIAGNOSIS — R52 Pain, unspecified: Secondary | ICD-10-CM | POA: Diagnosis not present

## 2023-12-27 DIAGNOSIS — N186 End stage renal disease: Secondary | ICD-10-CM | POA: Diagnosis not present

## 2023-12-27 DIAGNOSIS — T7840XA Allergy, unspecified, initial encounter: Secondary | ICD-10-CM | POA: Diagnosis not present

## 2023-12-28 ENCOUNTER — Other Ambulatory Visit: Payer: Self-pay | Admitting: Physical Medicine and Rehabilitation

## 2023-12-28 DIAGNOSIS — M542 Cervicalgia: Secondary | ICD-10-CM

## 2023-12-29 DIAGNOSIS — Z992 Dependence on renal dialysis: Secondary | ICD-10-CM | POA: Diagnosis not present

## 2023-12-29 DIAGNOSIS — D689 Coagulation defect, unspecified: Secondary | ICD-10-CM | POA: Diagnosis not present

## 2023-12-29 DIAGNOSIS — R52 Pain, unspecified: Secondary | ICD-10-CM | POA: Diagnosis not present

## 2023-12-29 DIAGNOSIS — N2581 Secondary hyperparathyroidism of renal origin: Secondary | ICD-10-CM | POA: Diagnosis not present

## 2023-12-29 DIAGNOSIS — R197 Diarrhea, unspecified: Secondary | ICD-10-CM | POA: Diagnosis not present

## 2023-12-29 DIAGNOSIS — T7840XA Allergy, unspecified, initial encounter: Secondary | ICD-10-CM | POA: Diagnosis not present

## 2023-12-29 DIAGNOSIS — D631 Anemia in chronic kidney disease: Secondary | ICD-10-CM | POA: Diagnosis not present

## 2023-12-29 DIAGNOSIS — N186 End stage renal disease: Secondary | ICD-10-CM | POA: Diagnosis not present

## 2023-12-29 DIAGNOSIS — D509 Iron deficiency anemia, unspecified: Secondary | ICD-10-CM | POA: Diagnosis not present

## 2024-01-01 DIAGNOSIS — N138 Other obstructive and reflux uropathy: Secondary | ICD-10-CM | POA: Diagnosis not present

## 2024-01-01 DIAGNOSIS — D509 Iron deficiency anemia, unspecified: Secondary | ICD-10-CM | POA: Diagnosis not present

## 2024-01-01 DIAGNOSIS — R197 Diarrhea, unspecified: Secondary | ICD-10-CM | POA: Diagnosis not present

## 2024-01-01 DIAGNOSIS — Z992 Dependence on renal dialysis: Secondary | ICD-10-CM | POA: Diagnosis not present

## 2024-01-01 DIAGNOSIS — D631 Anemia in chronic kidney disease: Secondary | ICD-10-CM | POA: Diagnosis not present

## 2024-01-01 DIAGNOSIS — N2581 Secondary hyperparathyroidism of renal origin: Secondary | ICD-10-CM | POA: Diagnosis not present

## 2024-01-01 DIAGNOSIS — D689 Coagulation defect, unspecified: Secondary | ICD-10-CM | POA: Diagnosis not present

## 2024-01-01 DIAGNOSIS — T7840XA Allergy, unspecified, initial encounter: Secondary | ICD-10-CM | POA: Diagnosis not present

## 2024-01-01 DIAGNOSIS — N186 End stage renal disease: Secondary | ICD-10-CM | POA: Diagnosis not present

## 2024-01-01 DIAGNOSIS — R52 Pain, unspecified: Secondary | ICD-10-CM | POA: Diagnosis not present

## 2024-01-02 DIAGNOSIS — M542 Cervicalgia: Secondary | ICD-10-CM | POA: Diagnosis not present

## 2024-01-03 ENCOUNTER — Ambulatory Visit
Admission: RE | Admit: 2024-01-03 | Discharge: 2024-01-03 | Disposition: A | Discharge status: Home / Self Care | Source: Ambulatory Visit | Attending: Physical Medicine and Rehabilitation | Admitting: Physical Medicine and Rehabilitation

## 2024-01-03 DIAGNOSIS — N2581 Secondary hyperparathyroidism of renal origin: Secondary | ICD-10-CM | POA: Diagnosis not present

## 2024-01-03 DIAGNOSIS — M4802 Spinal stenosis, cervical region: Secondary | ICD-10-CM | POA: Diagnosis not present

## 2024-01-03 DIAGNOSIS — R52 Pain, unspecified: Secondary | ICD-10-CM | POA: Diagnosis not present

## 2024-01-03 DIAGNOSIS — N186 End stage renal disease: Secondary | ICD-10-CM | POA: Diagnosis not present

## 2024-01-03 DIAGNOSIS — R197 Diarrhea, unspecified: Secondary | ICD-10-CM | POA: Diagnosis not present

## 2024-01-03 DIAGNOSIS — D631 Anemia in chronic kidney disease: Secondary | ICD-10-CM | POA: Diagnosis not present

## 2024-01-03 DIAGNOSIS — D689 Coagulation defect, unspecified: Secondary | ICD-10-CM | POA: Diagnosis not present

## 2024-01-03 DIAGNOSIS — T7840XA Allergy, unspecified, initial encounter: Secondary | ICD-10-CM | POA: Diagnosis not present

## 2024-01-03 DIAGNOSIS — M542 Cervicalgia: Secondary | ICD-10-CM

## 2024-01-03 DIAGNOSIS — Z992 Dependence on renal dialysis: Secondary | ICD-10-CM | POA: Diagnosis not present

## 2024-01-03 DIAGNOSIS — D509 Iron deficiency anemia, unspecified: Secondary | ICD-10-CM | POA: Diagnosis not present

## 2024-01-04 DIAGNOSIS — M542 Cervicalgia: Secondary | ICD-10-CM | POA: Diagnosis not present

## 2024-01-05 DIAGNOSIS — N2581 Secondary hyperparathyroidism of renal origin: Secondary | ICD-10-CM | POA: Diagnosis not present

## 2024-01-05 DIAGNOSIS — N186 End stage renal disease: Secondary | ICD-10-CM | POA: Diagnosis not present

## 2024-01-05 DIAGNOSIS — D689 Coagulation defect, unspecified: Secondary | ICD-10-CM | POA: Diagnosis not present

## 2024-01-05 DIAGNOSIS — Z992 Dependence on renal dialysis: Secondary | ICD-10-CM | POA: Diagnosis not present

## 2024-01-05 DIAGNOSIS — R52 Pain, unspecified: Secondary | ICD-10-CM | POA: Diagnosis not present

## 2024-01-05 DIAGNOSIS — D631 Anemia in chronic kidney disease: Secondary | ICD-10-CM | POA: Diagnosis not present

## 2024-01-05 DIAGNOSIS — R197 Diarrhea, unspecified: Secondary | ICD-10-CM | POA: Diagnosis not present

## 2024-01-05 DIAGNOSIS — D509 Iron deficiency anemia, unspecified: Secondary | ICD-10-CM | POA: Diagnosis not present

## 2024-01-05 DIAGNOSIS — T7840XA Allergy, unspecified, initial encounter: Secondary | ICD-10-CM | POA: Diagnosis not present

## 2024-01-08 DIAGNOSIS — D509 Iron deficiency anemia, unspecified: Secondary | ICD-10-CM | POA: Diagnosis not present

## 2024-01-08 DIAGNOSIS — Z992 Dependence on renal dialysis: Secondary | ICD-10-CM | POA: Diagnosis not present

## 2024-01-08 DIAGNOSIS — T7840XA Allergy, unspecified, initial encounter: Secondary | ICD-10-CM | POA: Diagnosis not present

## 2024-01-08 DIAGNOSIS — N186 End stage renal disease: Secondary | ICD-10-CM | POA: Diagnosis not present

## 2024-01-08 DIAGNOSIS — R197 Diarrhea, unspecified: Secondary | ICD-10-CM | POA: Diagnosis not present

## 2024-01-08 DIAGNOSIS — D689 Coagulation defect, unspecified: Secondary | ICD-10-CM | POA: Diagnosis not present

## 2024-01-08 DIAGNOSIS — N2581 Secondary hyperparathyroidism of renal origin: Secondary | ICD-10-CM | POA: Diagnosis not present

## 2024-01-08 DIAGNOSIS — D631 Anemia in chronic kidney disease: Secondary | ICD-10-CM | POA: Diagnosis not present

## 2024-01-08 DIAGNOSIS — R52 Pain, unspecified: Secondary | ICD-10-CM | POA: Diagnosis not present

## 2024-01-10 DIAGNOSIS — Z992 Dependence on renal dialysis: Secondary | ICD-10-CM | POA: Diagnosis not present

## 2024-01-10 DIAGNOSIS — D631 Anemia in chronic kidney disease: Secondary | ICD-10-CM | POA: Diagnosis not present

## 2024-01-10 DIAGNOSIS — D509 Iron deficiency anemia, unspecified: Secondary | ICD-10-CM | POA: Diagnosis not present

## 2024-01-10 DIAGNOSIS — T7840XA Allergy, unspecified, initial encounter: Secondary | ICD-10-CM | POA: Diagnosis not present

## 2024-01-10 DIAGNOSIS — N186 End stage renal disease: Secondary | ICD-10-CM | POA: Diagnosis not present

## 2024-01-10 DIAGNOSIS — D689 Coagulation defect, unspecified: Secondary | ICD-10-CM | POA: Diagnosis not present

## 2024-01-10 DIAGNOSIS — N2581 Secondary hyperparathyroidism of renal origin: Secondary | ICD-10-CM | POA: Diagnosis not present

## 2024-01-10 DIAGNOSIS — R197 Diarrhea, unspecified: Secondary | ICD-10-CM | POA: Diagnosis not present

## 2024-01-10 DIAGNOSIS — R52 Pain, unspecified: Secondary | ICD-10-CM | POA: Diagnosis not present

## 2024-01-11 DIAGNOSIS — I480 Paroxysmal atrial fibrillation: Secondary | ICD-10-CM | POA: Diagnosis not present

## 2024-01-11 DIAGNOSIS — M542 Cervicalgia: Secondary | ICD-10-CM | POA: Diagnosis not present

## 2024-01-12 ENCOUNTER — Ambulatory Visit: Payer: Self-pay | Admitting: Cardiology

## 2024-01-12 DIAGNOSIS — I480 Paroxysmal atrial fibrillation: Secondary | ICD-10-CM | POA: Diagnosis not present

## 2024-01-12 DIAGNOSIS — N2581 Secondary hyperparathyroidism of renal origin: Secondary | ICD-10-CM | POA: Diagnosis not present

## 2024-01-12 DIAGNOSIS — T7840XA Allergy, unspecified, initial encounter: Secondary | ICD-10-CM | POA: Diagnosis not present

## 2024-01-12 DIAGNOSIS — D631 Anemia in chronic kidney disease: Secondary | ICD-10-CM | POA: Diagnosis not present

## 2024-01-12 DIAGNOSIS — Z992 Dependence on renal dialysis: Secondary | ICD-10-CM | POA: Diagnosis not present

## 2024-01-12 DIAGNOSIS — N186 End stage renal disease: Secondary | ICD-10-CM | POA: Diagnosis not present

## 2024-01-12 DIAGNOSIS — D689 Coagulation defect, unspecified: Secondary | ICD-10-CM | POA: Diagnosis not present

## 2024-01-12 DIAGNOSIS — R197 Diarrhea, unspecified: Secondary | ICD-10-CM | POA: Diagnosis not present

## 2024-01-12 DIAGNOSIS — D509 Iron deficiency anemia, unspecified: Secondary | ICD-10-CM | POA: Diagnosis not present

## 2024-01-12 DIAGNOSIS — R52 Pain, unspecified: Secondary | ICD-10-CM | POA: Diagnosis not present

## 2024-01-15 DIAGNOSIS — Z992 Dependence on renal dialysis: Secondary | ICD-10-CM | POA: Diagnosis not present

## 2024-01-15 DIAGNOSIS — D631 Anemia in chronic kidney disease: Secondary | ICD-10-CM | POA: Diagnosis not present

## 2024-01-15 DIAGNOSIS — T7840XA Allergy, unspecified, initial encounter: Secondary | ICD-10-CM | POA: Diagnosis not present

## 2024-01-15 DIAGNOSIS — N186 End stage renal disease: Secondary | ICD-10-CM | POA: Diagnosis not present

## 2024-01-15 DIAGNOSIS — D689 Coagulation defect, unspecified: Secondary | ICD-10-CM | POA: Diagnosis not present

## 2024-01-15 DIAGNOSIS — R197 Diarrhea, unspecified: Secondary | ICD-10-CM | POA: Diagnosis not present

## 2024-01-15 DIAGNOSIS — R52 Pain, unspecified: Secondary | ICD-10-CM | POA: Diagnosis not present

## 2024-01-15 DIAGNOSIS — N2581 Secondary hyperparathyroidism of renal origin: Secondary | ICD-10-CM | POA: Diagnosis not present

## 2024-01-15 DIAGNOSIS — D509 Iron deficiency anemia, unspecified: Secondary | ICD-10-CM | POA: Diagnosis not present

## 2024-01-16 ENCOUNTER — Other Ambulatory Visit (HOSPITAL_COMMUNITY): Payer: Self-pay | Admitting: Urology

## 2024-01-16 DIAGNOSIS — C7951 Secondary malignant neoplasm of bone: Secondary | ICD-10-CM

## 2024-01-16 DIAGNOSIS — C61 Malignant neoplasm of prostate: Secondary | ICD-10-CM

## 2024-01-16 DIAGNOSIS — M542 Cervicalgia: Secondary | ICD-10-CM | POA: Diagnosis not present

## 2024-01-17 DIAGNOSIS — D631 Anemia in chronic kidney disease: Secondary | ICD-10-CM | POA: Diagnosis not present

## 2024-01-17 DIAGNOSIS — N2581 Secondary hyperparathyroidism of renal origin: Secondary | ICD-10-CM | POA: Diagnosis not present

## 2024-01-17 DIAGNOSIS — D689 Coagulation defect, unspecified: Secondary | ICD-10-CM | POA: Diagnosis not present

## 2024-01-17 DIAGNOSIS — T7840XA Allergy, unspecified, initial encounter: Secondary | ICD-10-CM | POA: Diagnosis not present

## 2024-01-17 DIAGNOSIS — Z992 Dependence on renal dialysis: Secondary | ICD-10-CM | POA: Diagnosis not present

## 2024-01-17 DIAGNOSIS — R197 Diarrhea, unspecified: Secondary | ICD-10-CM | POA: Diagnosis not present

## 2024-01-17 DIAGNOSIS — D509 Iron deficiency anemia, unspecified: Secondary | ICD-10-CM | POA: Diagnosis not present

## 2024-01-17 DIAGNOSIS — N186 End stage renal disease: Secondary | ICD-10-CM | POA: Diagnosis not present

## 2024-01-17 DIAGNOSIS — R52 Pain, unspecified: Secondary | ICD-10-CM | POA: Diagnosis not present

## 2024-01-19 DIAGNOSIS — Z992 Dependence on renal dialysis: Secondary | ICD-10-CM | POA: Diagnosis not present

## 2024-01-19 DIAGNOSIS — D509 Iron deficiency anemia, unspecified: Secondary | ICD-10-CM | POA: Diagnosis not present

## 2024-01-19 DIAGNOSIS — D689 Coagulation defect, unspecified: Secondary | ICD-10-CM | POA: Diagnosis not present

## 2024-01-19 DIAGNOSIS — T7840XA Allergy, unspecified, initial encounter: Secondary | ICD-10-CM | POA: Diagnosis not present

## 2024-01-19 DIAGNOSIS — D631 Anemia in chronic kidney disease: Secondary | ICD-10-CM | POA: Diagnosis not present

## 2024-01-19 DIAGNOSIS — N2581 Secondary hyperparathyroidism of renal origin: Secondary | ICD-10-CM | POA: Diagnosis not present

## 2024-01-19 DIAGNOSIS — R52 Pain, unspecified: Secondary | ICD-10-CM | POA: Diagnosis not present

## 2024-01-19 DIAGNOSIS — R197 Diarrhea, unspecified: Secondary | ICD-10-CM | POA: Diagnosis not present

## 2024-01-19 DIAGNOSIS — N186 End stage renal disease: Secondary | ICD-10-CM | POA: Diagnosis not present

## 2024-01-22 DIAGNOSIS — T7840XA Allergy, unspecified, initial encounter: Secondary | ICD-10-CM | POA: Diagnosis not present

## 2024-01-22 DIAGNOSIS — D509 Iron deficiency anemia, unspecified: Secondary | ICD-10-CM | POA: Diagnosis not present

## 2024-01-22 DIAGNOSIS — N2581 Secondary hyperparathyroidism of renal origin: Secondary | ICD-10-CM | POA: Diagnosis not present

## 2024-01-22 DIAGNOSIS — D631 Anemia in chronic kidney disease: Secondary | ICD-10-CM | POA: Diagnosis not present

## 2024-01-22 DIAGNOSIS — R197 Diarrhea, unspecified: Secondary | ICD-10-CM | POA: Diagnosis not present

## 2024-01-22 DIAGNOSIS — N186 End stage renal disease: Secondary | ICD-10-CM | POA: Diagnosis not present

## 2024-01-22 DIAGNOSIS — Z992 Dependence on renal dialysis: Secondary | ICD-10-CM | POA: Diagnosis not present

## 2024-01-22 DIAGNOSIS — D689 Coagulation defect, unspecified: Secondary | ICD-10-CM | POA: Diagnosis not present

## 2024-01-22 DIAGNOSIS — R52 Pain, unspecified: Secondary | ICD-10-CM | POA: Diagnosis not present

## 2024-01-23 DIAGNOSIS — D689 Coagulation defect, unspecified: Secondary | ICD-10-CM | POA: Diagnosis not present

## 2024-01-23 DIAGNOSIS — N2581 Secondary hyperparathyroidism of renal origin: Secondary | ICD-10-CM | POA: Diagnosis not present

## 2024-01-23 DIAGNOSIS — D631 Anemia in chronic kidney disease: Secondary | ICD-10-CM | POA: Diagnosis not present

## 2024-01-23 DIAGNOSIS — R197 Diarrhea, unspecified: Secondary | ICD-10-CM | POA: Diagnosis not present

## 2024-01-23 DIAGNOSIS — N186 End stage renal disease: Secondary | ICD-10-CM | POA: Diagnosis not present

## 2024-01-23 DIAGNOSIS — R52 Pain, unspecified: Secondary | ICD-10-CM | POA: Diagnosis not present

## 2024-01-23 DIAGNOSIS — T7840XA Allergy, unspecified, initial encounter: Secondary | ICD-10-CM | POA: Diagnosis not present

## 2024-01-23 DIAGNOSIS — Z992 Dependence on renal dialysis: Secondary | ICD-10-CM | POA: Diagnosis not present

## 2024-01-23 DIAGNOSIS — D509 Iron deficiency anemia, unspecified: Secondary | ICD-10-CM | POA: Diagnosis not present

## 2024-01-24 DIAGNOSIS — R52 Pain, unspecified: Secondary | ICD-10-CM | POA: Diagnosis not present

## 2024-01-24 DIAGNOSIS — D631 Anemia in chronic kidney disease: Secondary | ICD-10-CM | POA: Diagnosis not present

## 2024-01-24 DIAGNOSIS — T7840XA Allergy, unspecified, initial encounter: Secondary | ICD-10-CM | POA: Diagnosis not present

## 2024-01-24 DIAGNOSIS — Z992 Dependence on renal dialysis: Secondary | ICD-10-CM | POA: Diagnosis not present

## 2024-01-24 DIAGNOSIS — D689 Coagulation defect, unspecified: Secondary | ICD-10-CM | POA: Diagnosis not present

## 2024-01-24 DIAGNOSIS — D509 Iron deficiency anemia, unspecified: Secondary | ICD-10-CM | POA: Diagnosis not present

## 2024-01-24 DIAGNOSIS — N186 End stage renal disease: Secondary | ICD-10-CM | POA: Diagnosis not present

## 2024-01-24 DIAGNOSIS — N2581 Secondary hyperparathyroidism of renal origin: Secondary | ICD-10-CM | POA: Diagnosis not present

## 2024-01-24 DIAGNOSIS — R197 Diarrhea, unspecified: Secondary | ICD-10-CM | POA: Diagnosis not present

## 2024-01-25 ENCOUNTER — Ambulatory Visit (HOSPITAL_COMMUNITY)
Admission: RE | Admit: 2024-01-25 | Discharge: 2024-01-25 | Disposition: A | Discharge status: Home / Self Care | Source: Ambulatory Visit | Attending: Urology | Admitting: Urology

## 2024-01-25 DIAGNOSIS — D3502 Benign neoplasm of left adrenal gland: Secondary | ICD-10-CM | POA: Diagnosis not present

## 2024-01-25 DIAGNOSIS — C61 Malignant neoplasm of prostate: Secondary | ICD-10-CM

## 2024-01-25 DIAGNOSIS — C7951 Secondary malignant neoplasm of bone: Secondary | ICD-10-CM | POA: Diagnosis not present

## 2024-01-25 DIAGNOSIS — N3289 Other specified disorders of bladder: Secondary | ICD-10-CM | POA: Diagnosis not present

## 2024-01-25 MED ORDER — IOHEXOL 300 MG/ML  SOLN
100.0000 mL | Freq: Once | INTRAMUSCULAR | Status: AC | PRN
  Administered 2024-01-25: 100 mL via INTRAVENOUS

## 2024-01-25 MED ORDER — TECHNETIUM TC 99M MEDRONATE IV KIT
19.7000 | PACK | Freq: Once | INTRAVENOUS | Status: AC | PRN
  Administered 2024-01-25: 19.7 via INTRAVENOUS

## 2024-01-26 DIAGNOSIS — D689 Coagulation defect, unspecified: Secondary | ICD-10-CM | POA: Diagnosis not present

## 2024-01-26 DIAGNOSIS — D509 Iron deficiency anemia, unspecified: Secondary | ICD-10-CM | POA: Diagnosis not present

## 2024-01-26 DIAGNOSIS — R197 Diarrhea, unspecified: Secondary | ICD-10-CM | POA: Diagnosis not present

## 2024-01-26 DIAGNOSIS — Z992 Dependence on renal dialysis: Secondary | ICD-10-CM | POA: Diagnosis not present

## 2024-01-26 DIAGNOSIS — T7840XA Allergy, unspecified, initial encounter: Secondary | ICD-10-CM | POA: Diagnosis not present

## 2024-01-26 DIAGNOSIS — N186 End stage renal disease: Secondary | ICD-10-CM | POA: Diagnosis not present

## 2024-01-26 DIAGNOSIS — D631 Anemia in chronic kidney disease: Secondary | ICD-10-CM | POA: Diagnosis not present

## 2024-01-26 DIAGNOSIS — R52 Pain, unspecified: Secondary | ICD-10-CM | POA: Diagnosis not present

## 2024-01-26 DIAGNOSIS — N2581 Secondary hyperparathyroidism of renal origin: Secondary | ICD-10-CM | POA: Diagnosis not present

## 2024-01-29 DIAGNOSIS — N2581 Secondary hyperparathyroidism of renal origin: Secondary | ICD-10-CM | POA: Diagnosis not present

## 2024-01-29 DIAGNOSIS — D631 Anemia in chronic kidney disease: Secondary | ICD-10-CM | POA: Diagnosis not present

## 2024-01-29 DIAGNOSIS — R197 Diarrhea, unspecified: Secondary | ICD-10-CM | POA: Diagnosis not present

## 2024-01-29 DIAGNOSIS — R52 Pain, unspecified: Secondary | ICD-10-CM | POA: Diagnosis not present

## 2024-01-29 DIAGNOSIS — D509 Iron deficiency anemia, unspecified: Secondary | ICD-10-CM | POA: Diagnosis not present

## 2024-01-29 DIAGNOSIS — T7840XA Allergy, unspecified, initial encounter: Secondary | ICD-10-CM | POA: Diagnosis not present

## 2024-01-29 DIAGNOSIS — N186 End stage renal disease: Secondary | ICD-10-CM | POA: Diagnosis not present

## 2024-01-29 DIAGNOSIS — Z992 Dependence on renal dialysis: Secondary | ICD-10-CM | POA: Diagnosis not present

## 2024-01-29 DIAGNOSIS — D689 Coagulation defect, unspecified: Secondary | ICD-10-CM | POA: Diagnosis not present

## 2024-01-30 NOTE — Procedures (Signed)
Result scanned to media

## 2024-01-31 DIAGNOSIS — D689 Coagulation defect, unspecified: Secondary | ICD-10-CM | POA: Diagnosis not present

## 2024-01-31 DIAGNOSIS — Z992 Dependence on renal dialysis: Secondary | ICD-10-CM | POA: Diagnosis not present

## 2024-01-31 DIAGNOSIS — N186 End stage renal disease: Secondary | ICD-10-CM | POA: Diagnosis not present

## 2024-01-31 DIAGNOSIS — D509 Iron deficiency anemia, unspecified: Secondary | ICD-10-CM | POA: Diagnosis not present

## 2024-01-31 DIAGNOSIS — R197 Diarrhea, unspecified: Secondary | ICD-10-CM | POA: Diagnosis not present

## 2024-01-31 DIAGNOSIS — T7840XA Allergy, unspecified, initial encounter: Secondary | ICD-10-CM | POA: Diagnosis not present

## 2024-01-31 DIAGNOSIS — R52 Pain, unspecified: Secondary | ICD-10-CM | POA: Diagnosis not present

## 2024-01-31 DIAGNOSIS — N2581 Secondary hyperparathyroidism of renal origin: Secondary | ICD-10-CM | POA: Diagnosis not present

## 2024-01-31 DIAGNOSIS — D631 Anemia in chronic kidney disease: Secondary | ICD-10-CM | POA: Diagnosis not present

## 2024-02-01 ENCOUNTER — Ambulatory Visit (HOSPITAL_COMMUNITY)
Admission: RE | Admit: 2024-02-01 | Discharge: 2024-02-01 | Disposition: A | Discharge status: Home / Self Care | Source: Ambulatory Visit | Attending: Cardiology | Admitting: Cardiology

## 2024-02-01 DIAGNOSIS — I12 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 stage renal disease: Secondary | ICD-10-CM | POA: Diagnosis not present

## 2024-02-01 DIAGNOSIS — N138 Other obstructive and reflux uropathy: Secondary | ICD-10-CM | POA: Diagnosis not present

## 2024-02-01 DIAGNOSIS — I428 Other cardiomyopathies: Secondary | ICD-10-CM | POA: Diagnosis not present

## 2024-02-01 DIAGNOSIS — Z8249 Family history of ischemic heart disease and other diseases of the circulatory system: Secondary | ICD-10-CM | POA: Insufficient documentation

## 2024-02-01 DIAGNOSIS — R002 Palpitations: Secondary | ICD-10-CM | POA: Insufficient documentation

## 2024-02-01 DIAGNOSIS — N186 End stage renal disease: Secondary | ICD-10-CM | POA: Insufficient documentation

## 2024-02-01 DIAGNOSIS — E669 Obesity, unspecified: Secondary | ICD-10-CM | POA: Insufficient documentation

## 2024-02-01 DIAGNOSIS — Z87891 Personal history of nicotine dependence: Secondary | ICD-10-CM | POA: Diagnosis not present

## 2024-02-01 DIAGNOSIS — Z8546 Personal history of malignant neoplasm of prostate: Secondary | ICD-10-CM | POA: Diagnosis not present

## 2024-02-01 DIAGNOSIS — Z85830 Personal history of malignant neoplasm of bone: Secondary | ICD-10-CM | POA: Diagnosis not present

## 2024-02-01 DIAGNOSIS — R5383 Other fatigue: Secondary | ICD-10-CM | POA: Insufficient documentation

## 2024-02-01 DIAGNOSIS — R079 Chest pain, unspecified: Secondary | ICD-10-CM | POA: Diagnosis not present

## 2024-02-01 DIAGNOSIS — I083 Combined rheumatic disorders of mitral, aortic and tricuspid valves: Secondary | ICD-10-CM | POA: Insufficient documentation

## 2024-02-01 DIAGNOSIS — I371 Nonrheumatic pulmonary valve insufficiency: Secondary | ICD-10-CM | POA: Insufficient documentation

## 2024-02-01 DIAGNOSIS — R0600 Dyspnea, unspecified: Secondary | ICD-10-CM | POA: Diagnosis not present

## 2024-02-01 DIAGNOSIS — R591 Generalized enlarged lymph nodes: Secondary | ICD-10-CM | POA: Diagnosis not present

## 2024-02-01 DIAGNOSIS — I429 Cardiomyopathy, unspecified: Secondary | ICD-10-CM | POA: Insufficient documentation

## 2024-02-01 DIAGNOSIS — Z992 Dependence on renal dialysis: Secondary | ICD-10-CM | POA: Insufficient documentation

## 2024-02-01 DIAGNOSIS — E785 Hyperlipidemia, unspecified: Secondary | ICD-10-CM | POA: Insufficient documentation

## 2024-02-01 LAB — ECHOCARDIOGRAM COMPLETE
AR max vel: 2.66 cm2
AV Area VTI: 2.73 cm2
AV Area mean vel: 2.55 cm2
AV Mean grad: 7 mmHg
AV Peak grad: 13.1 mmHg
Ao pk vel: 1.81 m/s
Area-P 1/2: 5.29 cm2
S' Lateral: 4.65 cm

## 2024-02-02 DIAGNOSIS — R197 Diarrhea, unspecified: Secondary | ICD-10-CM | POA: Diagnosis not present

## 2024-02-02 DIAGNOSIS — T7840XA Allergy, unspecified, initial encounter: Secondary | ICD-10-CM | POA: Diagnosis not present

## 2024-02-02 DIAGNOSIS — R52 Pain, unspecified: Secondary | ICD-10-CM | POA: Diagnosis not present

## 2024-02-02 DIAGNOSIS — N2581 Secondary hyperparathyroidism of renal origin: Secondary | ICD-10-CM | POA: Diagnosis not present

## 2024-02-02 DIAGNOSIS — D689 Coagulation defect, unspecified: Secondary | ICD-10-CM | POA: Diagnosis not present

## 2024-02-02 DIAGNOSIS — D509 Iron deficiency anemia, unspecified: Secondary | ICD-10-CM | POA: Diagnosis not present

## 2024-02-02 DIAGNOSIS — D631 Anemia in chronic kidney disease: Secondary | ICD-10-CM | POA: Diagnosis not present

## 2024-02-02 DIAGNOSIS — N186 End stage renal disease: Secondary | ICD-10-CM | POA: Diagnosis not present

## 2024-02-02 DIAGNOSIS — Z992 Dependence on renal dialysis: Secondary | ICD-10-CM | POA: Diagnosis not present

## 2024-02-05 ENCOUNTER — Encounter: Payer: Self-pay | Admitting: Family Medicine

## 2024-02-05 ENCOUNTER — Other Ambulatory Visit: Payer: Self-pay | Admitting: Family Medicine

## 2024-02-05 DIAGNOSIS — J4520 Mild intermittent asthma, uncomplicated: Secondary | ICD-10-CM

## 2024-02-05 DIAGNOSIS — R52 Pain, unspecified: Secondary | ICD-10-CM | POA: Diagnosis not present

## 2024-02-05 DIAGNOSIS — D631 Anemia in chronic kidney disease: Secondary | ICD-10-CM | POA: Diagnosis not present

## 2024-02-05 DIAGNOSIS — R197 Diarrhea, unspecified: Secondary | ICD-10-CM | POA: Diagnosis not present

## 2024-02-05 DIAGNOSIS — N2581 Secondary hyperparathyroidism of renal origin: Secondary | ICD-10-CM | POA: Diagnosis not present

## 2024-02-05 DIAGNOSIS — D689 Coagulation defect, unspecified: Secondary | ICD-10-CM | POA: Diagnosis not present

## 2024-02-05 DIAGNOSIS — D509 Iron deficiency anemia, unspecified: Secondary | ICD-10-CM | POA: Diagnosis not present

## 2024-02-05 DIAGNOSIS — T7840XA Allergy, unspecified, initial encounter: Secondary | ICD-10-CM | POA: Diagnosis not present

## 2024-02-05 DIAGNOSIS — F419 Anxiety disorder, unspecified: Secondary | ICD-10-CM

## 2024-02-05 DIAGNOSIS — Z992 Dependence on renal dialysis: Secondary | ICD-10-CM | POA: Diagnosis not present

## 2024-02-05 DIAGNOSIS — N186 End stage renal disease: Secondary | ICD-10-CM | POA: Diagnosis not present

## 2024-02-05 MED ORDER — ALBUTEROL SULFATE HFA 108 (90 BASE) MCG/ACT IN AERS: 2.0000 | INHALATION_SPRAY | Freq: Four times a day (QID) | RESPIRATORY_TRACT | 0 refills | Status: DC | PRN

## 2024-02-05 MED ORDER — ALPRAZOLAM 0.25 MG PO TABS: 0.2500 mg | ORAL_TABLET | Freq: Every day | ORAL | 0 refills | Status: DC | PRN

## 2024-02-05 NOTE — Telephone Encounter (Unsigned)
 Copied from CRM [id number]. Topic: Clinical - Medication Refill >> Feb 05, 2024  3:08 PM Carlatta H wrote: Medication: VENTOLIN  HFA 108 (90 Base) MCG/ACT inhaler ALPRAZolam  (XANAX ) 0.25 MG tablet  Has the patient contacted their pharmacy? No (Agent: If no, request that the patient contact the pharmacy for the refill. If patient does not wish to contact the pharmacy document the reason why and proceed with request.) (Agent: If yes, when and what did the pharmacy advise?)  This is the patient's preferred pharmacy:    [facility] [address],  - [address] [place] [address] Phone: [phone] Fax: [phone]  Is this the correct pharmacy for this prescription? Yes If no, delete pharmacy and type the correct one.   Has the prescription been filled recently? No  Is the patient out of the medication? Yes  Has the patient been seen for an appointment in the last year OR does the patient have an upcoming appointment? Yes  Can we respond through MyChart? Yes  Agent: Please be advised that Rx refills may take up to 3 business days. We ask that you follow-up with your pharmacy.

## 2024-02-07 DIAGNOSIS — N2581 Secondary hyperparathyroidism of renal origin: Secondary | ICD-10-CM | POA: Diagnosis not present

## 2024-02-07 DIAGNOSIS — D689 Coagulation defect, unspecified: Secondary | ICD-10-CM | POA: Diagnosis not present

## 2024-02-07 DIAGNOSIS — N186 End stage renal disease: Secondary | ICD-10-CM | POA: Diagnosis not present

## 2024-02-07 DIAGNOSIS — T7840XA Allergy, unspecified, initial encounter: Secondary | ICD-10-CM | POA: Diagnosis not present

## 2024-02-07 DIAGNOSIS — R197 Diarrhea, unspecified: Secondary | ICD-10-CM | POA: Diagnosis not present

## 2024-02-07 DIAGNOSIS — R52 Pain, unspecified: Secondary | ICD-10-CM | POA: Diagnosis not present

## 2024-02-07 DIAGNOSIS — Z992 Dependence on renal dialysis: Secondary | ICD-10-CM | POA: Diagnosis not present

## 2024-02-07 DIAGNOSIS — D631 Anemia in chronic kidney disease: Secondary | ICD-10-CM | POA: Diagnosis not present

## 2024-02-07 DIAGNOSIS — D509 Iron deficiency anemia, unspecified: Secondary | ICD-10-CM | POA: Diagnosis not present

## 2024-02-08 DIAGNOSIS — M542 Cervicalgia: Secondary | ICD-10-CM | POA: Diagnosis not present

## 2024-02-08 DIAGNOSIS — M1611 Unilateral primary osteoarthritis, right hip: Secondary | ICD-10-CM | POA: Diagnosis not present

## 2024-02-08 DIAGNOSIS — M1711 Unilateral primary osteoarthritis, right knee: Secondary | ICD-10-CM | POA: Diagnosis not present

## 2024-02-09 DIAGNOSIS — D509 Iron deficiency anemia, unspecified: Secondary | ICD-10-CM | POA: Diagnosis not present

## 2024-02-09 DIAGNOSIS — Z992 Dependence on renal dialysis: Secondary | ICD-10-CM | POA: Diagnosis not present

## 2024-02-09 DIAGNOSIS — R197 Diarrhea, unspecified: Secondary | ICD-10-CM | POA: Diagnosis not present

## 2024-02-09 DIAGNOSIS — D689 Coagulation defect, unspecified: Secondary | ICD-10-CM | POA: Diagnosis not present

## 2024-02-09 DIAGNOSIS — R52 Pain, unspecified: Secondary | ICD-10-CM | POA: Diagnosis not present

## 2024-02-09 DIAGNOSIS — N186 End stage renal disease: Secondary | ICD-10-CM | POA: Diagnosis not present

## 2024-02-09 DIAGNOSIS — D631 Anemia in chronic kidney disease: Secondary | ICD-10-CM | POA: Diagnosis not present

## 2024-02-09 DIAGNOSIS — N2581 Secondary hyperparathyroidism of renal origin: Secondary | ICD-10-CM | POA: Diagnosis not present

## 2024-02-09 DIAGNOSIS — T7840XA Allergy, unspecified, initial encounter: Secondary | ICD-10-CM | POA: Diagnosis not present

## 2024-02-12 DIAGNOSIS — R52 Pain, unspecified: Secondary | ICD-10-CM | POA: Diagnosis not present

## 2024-02-12 DIAGNOSIS — R197 Diarrhea, unspecified: Secondary | ICD-10-CM | POA: Diagnosis not present

## 2024-02-12 DIAGNOSIS — D631 Anemia in chronic kidney disease: Secondary | ICD-10-CM | POA: Diagnosis not present

## 2024-02-12 DIAGNOSIS — D509 Iron deficiency anemia, unspecified: Secondary | ICD-10-CM | POA: Diagnosis not present

## 2024-02-12 DIAGNOSIS — N2581 Secondary hyperparathyroidism of renal origin: Secondary | ICD-10-CM | POA: Diagnosis not present

## 2024-02-12 DIAGNOSIS — D689 Coagulation defect, unspecified: Secondary | ICD-10-CM | POA: Diagnosis not present

## 2024-02-12 DIAGNOSIS — N186 End stage renal disease: Secondary | ICD-10-CM | POA: Diagnosis not present

## 2024-02-12 DIAGNOSIS — T7840XA Allergy, unspecified, initial encounter: Secondary | ICD-10-CM | POA: Diagnosis not present

## 2024-02-12 DIAGNOSIS — Z992 Dependence on renal dialysis: Secondary | ICD-10-CM | POA: Diagnosis not present

## 2024-02-14 DIAGNOSIS — Z992 Dependence on renal dialysis: Secondary | ICD-10-CM | POA: Diagnosis not present

## 2024-02-14 DIAGNOSIS — N2581 Secondary hyperparathyroidism of renal origin: Secondary | ICD-10-CM | POA: Diagnosis not present

## 2024-02-14 DIAGNOSIS — D631 Anemia in chronic kidney disease: Secondary | ICD-10-CM | POA: Diagnosis not present

## 2024-02-14 DIAGNOSIS — N186 End stage renal disease: Secondary | ICD-10-CM | POA: Diagnosis not present

## 2024-02-14 DIAGNOSIS — D509 Iron deficiency anemia, unspecified: Secondary | ICD-10-CM | POA: Diagnosis not present

## 2024-02-14 DIAGNOSIS — R52 Pain, unspecified: Secondary | ICD-10-CM | POA: Diagnosis not present

## 2024-02-14 DIAGNOSIS — D689 Coagulation defect, unspecified: Secondary | ICD-10-CM | POA: Diagnosis not present

## 2024-02-14 DIAGNOSIS — T7840XA Allergy, unspecified, initial encounter: Secondary | ICD-10-CM | POA: Diagnosis not present

## 2024-02-14 DIAGNOSIS — R197 Diarrhea, unspecified: Secondary | ICD-10-CM | POA: Diagnosis not present

## 2024-02-16 DIAGNOSIS — D631 Anemia in chronic kidney disease: Secondary | ICD-10-CM | POA: Diagnosis not present

## 2024-02-16 DIAGNOSIS — Z992 Dependence on renal dialysis: Secondary | ICD-10-CM | POA: Diagnosis not present

## 2024-02-16 DIAGNOSIS — R197 Diarrhea, unspecified: Secondary | ICD-10-CM | POA: Diagnosis not present

## 2024-02-16 DIAGNOSIS — D509 Iron deficiency anemia, unspecified: Secondary | ICD-10-CM | POA: Diagnosis not present

## 2024-02-16 DIAGNOSIS — D689 Coagulation defect, unspecified: Secondary | ICD-10-CM | POA: Diagnosis not present

## 2024-02-16 DIAGNOSIS — N2581 Secondary hyperparathyroidism of renal origin: Secondary | ICD-10-CM | POA: Diagnosis not present

## 2024-02-16 DIAGNOSIS — R52 Pain, unspecified: Secondary | ICD-10-CM | POA: Diagnosis not present

## 2024-02-16 DIAGNOSIS — N186 End stage renal disease: Secondary | ICD-10-CM | POA: Diagnosis not present

## 2024-02-16 DIAGNOSIS — T7840XA Allergy, unspecified, initial encounter: Secondary | ICD-10-CM | POA: Diagnosis not present

## 2024-02-19 DIAGNOSIS — N2581 Secondary hyperparathyroidism of renal origin: Secondary | ICD-10-CM | POA: Diagnosis not present

## 2024-02-19 DIAGNOSIS — T7840XA Allergy, unspecified, initial encounter: Secondary | ICD-10-CM | POA: Diagnosis not present

## 2024-02-19 DIAGNOSIS — D631 Anemia in chronic kidney disease: Secondary | ICD-10-CM | POA: Diagnosis not present

## 2024-02-19 DIAGNOSIS — N186 End stage renal disease: Secondary | ICD-10-CM | POA: Diagnosis not present

## 2024-02-19 DIAGNOSIS — R197 Diarrhea, unspecified: Secondary | ICD-10-CM | POA: Diagnosis not present

## 2024-02-19 DIAGNOSIS — D689 Coagulation defect, unspecified: Secondary | ICD-10-CM | POA: Diagnosis not present

## 2024-02-19 DIAGNOSIS — R52 Pain, unspecified: Secondary | ICD-10-CM | POA: Diagnosis not present

## 2024-02-19 DIAGNOSIS — Z992 Dependence on renal dialysis: Secondary | ICD-10-CM | POA: Diagnosis not present

## 2024-02-19 DIAGNOSIS — D509 Iron deficiency anemia, unspecified: Secondary | ICD-10-CM | POA: Diagnosis not present

## 2024-02-21 DIAGNOSIS — R52 Pain, unspecified: Secondary | ICD-10-CM | POA: Diagnosis not present

## 2024-02-21 DIAGNOSIS — D689 Coagulation defect, unspecified: Secondary | ICD-10-CM | POA: Diagnosis not present

## 2024-02-21 DIAGNOSIS — R197 Diarrhea, unspecified: Secondary | ICD-10-CM | POA: Diagnosis not present

## 2024-02-21 DIAGNOSIS — Z992 Dependence on renal dialysis: Secondary | ICD-10-CM | POA: Diagnosis not present

## 2024-02-21 DIAGNOSIS — N186 End stage renal disease: Secondary | ICD-10-CM | POA: Diagnosis not present

## 2024-02-21 DIAGNOSIS — D509 Iron deficiency anemia, unspecified: Secondary | ICD-10-CM | POA: Diagnosis not present

## 2024-02-21 DIAGNOSIS — T7840XA Allergy, unspecified, initial encounter: Secondary | ICD-10-CM | POA: Diagnosis not present

## 2024-02-21 DIAGNOSIS — D631 Anemia in chronic kidney disease: Secondary | ICD-10-CM | POA: Diagnosis not present

## 2024-02-21 DIAGNOSIS — N2581 Secondary hyperparathyroidism of renal origin: Secondary | ICD-10-CM | POA: Diagnosis not present

## 2024-02-22 DIAGNOSIS — C7951 Secondary malignant neoplasm of bone: Secondary | ICD-10-CM | POA: Diagnosis not present

## 2024-02-22 DIAGNOSIS — N186 End stage renal disease: Secondary | ICD-10-CM | POA: Diagnosis not present

## 2024-02-22 DIAGNOSIS — N130 Hydronephrosis with ureteropelvic junction obstruction: Secondary | ICD-10-CM | POA: Diagnosis not present

## 2024-02-23 DIAGNOSIS — Z992 Dependence on renal dialysis: Secondary | ICD-10-CM | POA: Diagnosis not present

## 2024-02-23 DIAGNOSIS — T7840XA Allergy, unspecified, initial encounter: Secondary | ICD-10-CM | POA: Diagnosis not present

## 2024-02-23 DIAGNOSIS — N186 End stage renal disease: Secondary | ICD-10-CM | POA: Diagnosis not present

## 2024-02-23 DIAGNOSIS — N2581 Secondary hyperparathyroidism of renal origin: Secondary | ICD-10-CM | POA: Diagnosis not present

## 2024-02-23 DIAGNOSIS — D631 Anemia in chronic kidney disease: Secondary | ICD-10-CM | POA: Diagnosis not present

## 2024-02-23 DIAGNOSIS — R197 Diarrhea, unspecified: Secondary | ICD-10-CM | POA: Diagnosis not present

## 2024-02-23 DIAGNOSIS — D689 Coagulation defect, unspecified: Secondary | ICD-10-CM | POA: Diagnosis not present

## 2024-02-23 DIAGNOSIS — D509 Iron deficiency anemia, unspecified: Secondary | ICD-10-CM | POA: Diagnosis not present

## 2024-02-23 DIAGNOSIS — R52 Pain, unspecified: Secondary | ICD-10-CM | POA: Diagnosis not present

## 2024-02-25 NOTE — Progress Notes (Unsigned)
 Cardiology Office Note:   Date:  02/27/2024  ID:  Devon Cook, DOB 03/10/1961, MRN [id number] PCP: Joyce Norleen BROCKS, MD  [facility] Providers Cardiologist:  Powell FORBES Sorrow, MD (Inactive) {  History of Present Illness:   Devon Cook is a 63 y.o. male male for his first visit with me.  He was previously seen by Dr. Sorrow. He has been seen for chest pain.  He has had some aortic dilatation with it being 42 mm in 2021.  He has had moderately reduced ejection fraction with an EF of 45 to 50%.  When I previously saw him he did have PAF and so has not been on a DOAC as it was only 1 episode and he had some anemia.  However, he had a monitor that I ordered in August that demonstrated persistent atrial fibrillation with an average heart rate of 98.  He had some possible nonsustained ventricular tachycardia versus fibrillation with aberrancy  that lasted only 5 beats.    He returns to discuss this.  As he was last seen he has had no new cardiovascular complaints.  He tolerates dialysis.  He tries to stay active. The patient denies any new symptoms such as chest discomfort, neck or arm discomfort. There has been no new shortness of breath, PND or orthopnea. There have been no reported palpitations, presyncope or syncope.   ROS: As stated in the HPI and negative for all other systems.  Studies Reviewed:    EKG:    NA  Risk Assessment/Calculations:    CHA2DS2-VASc Score = 2   This indicates a 2.2% annual risk of stroke. The patient's score is based upon: CHF History: 1 HTN History: 1 Diabetes History: 0 Stroke History: 0 Vascular Disease History: 0 Age Score: 0 Gender Score: 0  Physical Exam:   VS:  BP 130/86   Pulse 84   Ht 6' (1.829 m)   Wt 284 lb 6.4 oz (129 kg)   SpO2 97%   BMI 38.57 kg/m    Wt Readings from Last 3 Encounters:  02/27/24 284 lb 6.4 oz (129 kg)  12/21/23 284 lb 6.4 oz (129 kg)  11/16/23 283 lb 6.4 oz (128.5 kg)     GEN: Well nourished,  well developed in no acute distress NECK: No JVD; No carotid bruits CARDIAC: Irregular RR, no murmurs, rubs, gallops RESPIRATORY:  Clear to auscultation without rales, wheezing or rhonchi  ABDOMEN: Soft, non-tender, non-distended EXTREMITIES:  No edema; No deformity   ASSESSMENT AND PLAN:   Aortic dilatation:  He is overdue for follow up CT.    I will arrange a CT.    He needs imaging with CT as this has not been evaluated since 2021 and it was 42 mm.  However, since her check an echocardiogram I will check his aortic root size this way and do a CT next time.     HFmrEF:   The LVEF in July was low normal and perhaps slightly better than previous.  It was listed at 50 to 55% with his previous being 45 to 50%.    No change in therapy.       Atrial fibrillation:    He is in persistent atrial fibrillation.    I have seen Devon Cook is a 63 y.o. male in the office today who is being considered for a Watchman left atrial appendage closure device.  He has a history of persistent  atrial fibrillation.  This patients CHA2DS2-VASc Score  and unadjusted Ischemic Stroke Rate (% per year) is equal to 2.2 % stroke rate/year from a score of 2 which necessitates long term oral anticoagulation to prevent stroke.  Unfortunately, He is not felt to be a long term Warfarin candidate secondary to chronic blood loss anemia.  The patients chart has been reviewed and I feel that they would be a candidate for short term oral anticoagulation.  Procedural risks for the Watchman implant have been reviewed with the patient including a 1% risk of stroke, 2% risk of perforation, 0.1% risk of device embolization.  Given the patient's poor candidacy for long-term oral anticoagulation and ability to tolerate short term oral anticoagulation I have recommended the watchman left atrial appendage closure system.      Hypertension:   His blood pressure is at target.  No change in therapy. e this regimen.     Sleep apnea:  He is not  able to use CPAP.  No change in therapy.   Follow up with me in six months. soak  Signed, Lynwood Schilling, MD

## 2024-02-26 DIAGNOSIS — R52 Pain, unspecified: Secondary | ICD-10-CM | POA: Diagnosis not present

## 2024-02-26 DIAGNOSIS — Z992 Dependence on renal dialysis: Secondary | ICD-10-CM | POA: Diagnosis not present

## 2024-02-26 DIAGNOSIS — D689 Coagulation defect, unspecified: Secondary | ICD-10-CM | POA: Diagnosis not present

## 2024-02-26 DIAGNOSIS — N2581 Secondary hyperparathyroidism of renal origin: Secondary | ICD-10-CM | POA: Diagnosis not present

## 2024-02-26 DIAGNOSIS — R197 Diarrhea, unspecified: Secondary | ICD-10-CM | POA: Diagnosis not present

## 2024-02-26 DIAGNOSIS — D631 Anemia in chronic kidney disease: Secondary | ICD-10-CM | POA: Diagnosis not present

## 2024-02-26 DIAGNOSIS — T7840XA Allergy, unspecified, initial encounter: Secondary | ICD-10-CM | POA: Diagnosis not present

## 2024-02-26 DIAGNOSIS — D509 Iron deficiency anemia, unspecified: Secondary | ICD-10-CM | POA: Diagnosis not present

## 2024-02-26 DIAGNOSIS — N186 End stage renal disease: Secondary | ICD-10-CM | POA: Diagnosis not present

## 2024-02-27 ENCOUNTER — Encounter: Payer: Self-pay | Admitting: Cardiology

## 2024-02-27 ENCOUNTER — Ambulatory Visit: Attending: Cardiology | Admitting: Cardiology

## 2024-02-27 VITALS — BP 130/86 | HR 84 | Ht 72.0 in | Wt 284.4 lb

## 2024-02-27 DIAGNOSIS — I5022 Chronic systolic (congestive) heart failure: Secondary | ICD-10-CM | POA: Diagnosis not present

## 2024-02-27 DIAGNOSIS — I10 Essential (primary) hypertension: Secondary | ICD-10-CM | POA: Diagnosis not present

## 2024-02-27 DIAGNOSIS — I77810 Thoracic aortic ectasia: Secondary | ICD-10-CM

## 2024-02-27 DIAGNOSIS — I480 Paroxysmal atrial fibrillation: Secondary | ICD-10-CM | POA: Diagnosis not present

## 2024-02-27 NOTE — Patient Instructions (Addendum)
 Medication Instructions:  Your physician recommends that you continue on your current medications as directed. Please refer to the Current Medication list given to you today.  *If you need a refill on your cardiac medications before your next appointment, please call your pharmacy*  Lab Work: None ordered   Testing/Procedures: Non-Cardiac CT Angiography (CTA), is a special type of CT scan that uses a computer to produce multi-dimensional views of major blood vessels throughout the body. In CT angiography, a contrast material is injected through an IV to help visualize the blood vessels   Follow-Up: At [facility], you and your health needs are our priority.  As part of our continuing mission to provide you with exceptional heart care, our providers are all part of one team.  This team includes your primary Cardiologist (physician) and Advanced Practice Providers or APPs (Physician Assistants and Nurse Practitioners) who all work together to provide you with the care you need, when you need it.  You have been referred to EP to discuss Watchman  Your next appointment:   6 month(s)  Provider:   Dr. Lavona     Thank you for choosing [facility]!!   [phone]

## 2024-02-28 ENCOUNTER — Encounter: Payer: Self-pay | Admitting: Cardiology

## 2024-02-28 DIAGNOSIS — D509 Iron deficiency anemia, unspecified: Secondary | ICD-10-CM | POA: Diagnosis not present

## 2024-02-28 DIAGNOSIS — N2581 Secondary hyperparathyroidism of renal origin: Secondary | ICD-10-CM | POA: Diagnosis not present

## 2024-02-28 DIAGNOSIS — M25551 Pain in right hip: Secondary | ICD-10-CM | POA: Diagnosis not present

## 2024-02-28 DIAGNOSIS — M1611 Unilateral primary osteoarthritis, right hip: Secondary | ICD-10-CM | POA: Diagnosis not present

## 2024-02-28 DIAGNOSIS — R197 Diarrhea, unspecified: Secondary | ICD-10-CM | POA: Diagnosis not present

## 2024-02-28 DIAGNOSIS — D631 Anemia in chronic kidney disease: Secondary | ICD-10-CM | POA: Diagnosis not present

## 2024-02-28 DIAGNOSIS — R52 Pain, unspecified: Secondary | ICD-10-CM | POA: Diagnosis not present

## 2024-02-28 DIAGNOSIS — N186 End stage renal disease: Secondary | ICD-10-CM | POA: Diagnosis not present

## 2024-02-28 DIAGNOSIS — Z992 Dependence on renal dialysis: Secondary | ICD-10-CM | POA: Diagnosis not present

## 2024-02-28 DIAGNOSIS — T7840XA Allergy, unspecified, initial encounter: Secondary | ICD-10-CM | POA: Diagnosis not present

## 2024-02-28 DIAGNOSIS — D689 Coagulation defect, unspecified: Secondary | ICD-10-CM | POA: Diagnosis not present

## 2024-03-01 ENCOUNTER — Ambulatory Visit (HOSPITAL_COMMUNITY)

## 2024-03-01 DIAGNOSIS — D631 Anemia in chronic kidney disease: Secondary | ICD-10-CM | POA: Diagnosis not present

## 2024-03-01 DIAGNOSIS — N2581 Secondary hyperparathyroidism of renal origin: Secondary | ICD-10-CM | POA: Diagnosis not present

## 2024-03-01 DIAGNOSIS — D689 Coagulation defect, unspecified: Secondary | ICD-10-CM | POA: Diagnosis not present

## 2024-03-01 DIAGNOSIS — R52 Pain, unspecified: Secondary | ICD-10-CM | POA: Diagnosis not present

## 2024-03-01 DIAGNOSIS — N186 End stage renal disease: Secondary | ICD-10-CM | POA: Diagnosis not present

## 2024-03-01 DIAGNOSIS — R197 Diarrhea, unspecified: Secondary | ICD-10-CM | POA: Diagnosis not present

## 2024-03-01 DIAGNOSIS — Z992 Dependence on renal dialysis: Secondary | ICD-10-CM | POA: Diagnosis not present

## 2024-03-01 DIAGNOSIS — T7840XA Allergy, unspecified, initial encounter: Secondary | ICD-10-CM | POA: Diagnosis not present

## 2024-03-01 DIAGNOSIS — D509 Iron deficiency anemia, unspecified: Secondary | ICD-10-CM | POA: Diagnosis not present

## 2024-03-01 NOTE — Telephone Encounter (Signed)
**Note De-Identified Jaquanna Ballentine Obfuscation** Forwarding this message to Dr Lavona for his recommendation.

## 2024-03-03 DIAGNOSIS — Z992 Dependence on renal dialysis: Secondary | ICD-10-CM | POA: Diagnosis not present

## 2024-03-03 DIAGNOSIS — N186 End stage renal disease: Secondary | ICD-10-CM | POA: Diagnosis not present

## 2024-03-03 DIAGNOSIS — N138 Other obstructive and reflux uropathy: Secondary | ICD-10-CM | POA: Diagnosis not present

## 2024-03-04 DIAGNOSIS — N2581 Secondary hyperparathyroidism of renal origin: Secondary | ICD-10-CM | POA: Diagnosis not present

## 2024-03-04 DIAGNOSIS — T7840XA Allergy, unspecified, initial encounter: Secondary | ICD-10-CM | POA: Diagnosis not present

## 2024-03-04 DIAGNOSIS — R197 Diarrhea, unspecified: Secondary | ICD-10-CM | POA: Diagnosis not present

## 2024-03-04 DIAGNOSIS — D509 Iron deficiency anemia, unspecified: Secondary | ICD-10-CM | POA: Diagnosis not present

## 2024-03-04 DIAGNOSIS — T8249XD Other complication of vascular dialysis catheter, subsequent encounter: Secondary | ICD-10-CM | POA: Diagnosis not present

## 2024-03-04 DIAGNOSIS — Z992 Dependence on renal dialysis: Secondary | ICD-10-CM | POA: Diagnosis not present

## 2024-03-04 DIAGNOSIS — R52 Pain, unspecified: Secondary | ICD-10-CM | POA: Diagnosis not present

## 2024-03-04 DIAGNOSIS — N186 End stage renal disease: Secondary | ICD-10-CM | POA: Diagnosis not present

## 2024-03-04 DIAGNOSIS — D631 Anemia in chronic kidney disease: Secondary | ICD-10-CM | POA: Diagnosis not present

## 2024-03-04 DIAGNOSIS — D689 Coagulation defect, unspecified: Secondary | ICD-10-CM | POA: Diagnosis not present

## 2024-03-05 ENCOUNTER — Encounter: Payer: Self-pay | Admitting: Cardiology

## 2024-03-06 ENCOUNTER — Other Ambulatory Visit: Payer: Self-pay | Admitting: Family Medicine

## 2024-03-06 DIAGNOSIS — R197 Diarrhea, unspecified: Secondary | ICD-10-CM | POA: Diagnosis not present

## 2024-03-06 DIAGNOSIS — Z992 Dependence on renal dialysis: Secondary | ICD-10-CM | POA: Diagnosis not present

## 2024-03-06 DIAGNOSIS — R52 Pain, unspecified: Secondary | ICD-10-CM | POA: Diagnosis not present

## 2024-03-06 DIAGNOSIS — N2581 Secondary hyperparathyroidism of renal origin: Secondary | ICD-10-CM | POA: Diagnosis not present

## 2024-03-06 DIAGNOSIS — T8249XD Other complication of vascular dialysis catheter, subsequent encounter: Secondary | ICD-10-CM | POA: Diagnosis not present

## 2024-03-06 DIAGNOSIS — D631 Anemia in chronic kidney disease: Secondary | ICD-10-CM | POA: Diagnosis not present

## 2024-03-06 DIAGNOSIS — T7840XA Allergy, unspecified, initial encounter: Secondary | ICD-10-CM | POA: Diagnosis not present

## 2024-03-06 DIAGNOSIS — J4520 Mild intermittent asthma, uncomplicated: Secondary | ICD-10-CM

## 2024-03-06 DIAGNOSIS — D689 Coagulation defect, unspecified: Secondary | ICD-10-CM | POA: Diagnosis not present

## 2024-03-06 DIAGNOSIS — N186 End stage renal disease: Secondary | ICD-10-CM | POA: Diagnosis not present

## 2024-03-06 DIAGNOSIS — D509 Iron deficiency anemia, unspecified: Secondary | ICD-10-CM | POA: Diagnosis not present

## 2024-03-07 ENCOUNTER — Telehealth: Payer: Self-pay

## 2024-03-07 NOTE — Telephone Encounter (Signed)
**Note De-Identified Matheo Rathbone Obfuscation** Per the [facility] provider portal a PA is not required for a NPSG. Prior Authorization/Notification is not required for the requested service(s). Decision ID #: [id number]  I have notified the pt and I provided him with the Sleep Lab's phone number so he can call them to be scheduled. I have transferred the order to the Sleep Lab.

## 2024-03-08 DIAGNOSIS — D509 Iron deficiency anemia, unspecified: Secondary | ICD-10-CM | POA: Diagnosis not present

## 2024-03-08 DIAGNOSIS — D631 Anemia in chronic kidney disease: Secondary | ICD-10-CM | POA: Diagnosis not present

## 2024-03-08 DIAGNOSIS — R52 Pain, unspecified: Secondary | ICD-10-CM | POA: Diagnosis not present

## 2024-03-08 DIAGNOSIS — N186 End stage renal disease: Secondary | ICD-10-CM | POA: Diagnosis not present

## 2024-03-08 DIAGNOSIS — R197 Diarrhea, unspecified: Secondary | ICD-10-CM | POA: Diagnosis not present

## 2024-03-08 DIAGNOSIS — Z992 Dependence on renal dialysis: Secondary | ICD-10-CM | POA: Diagnosis not present

## 2024-03-08 DIAGNOSIS — T7840XA Allergy, unspecified, initial encounter: Secondary | ICD-10-CM | POA: Diagnosis not present

## 2024-03-08 DIAGNOSIS — N2581 Secondary hyperparathyroidism of renal origin: Secondary | ICD-10-CM | POA: Diagnosis not present

## 2024-03-08 DIAGNOSIS — D689 Coagulation defect, unspecified: Secondary | ICD-10-CM | POA: Diagnosis not present

## 2024-03-08 DIAGNOSIS — T8249XD Other complication of vascular dialysis catheter, subsequent encounter: Secondary | ICD-10-CM | POA: Diagnosis not present

## 2024-03-11 DIAGNOSIS — D509 Iron deficiency anemia, unspecified: Secondary | ICD-10-CM | POA: Diagnosis not present

## 2024-03-11 DIAGNOSIS — N2581 Secondary hyperparathyroidism of renal origin: Secondary | ICD-10-CM | POA: Diagnosis not present

## 2024-03-11 DIAGNOSIS — R197 Diarrhea, unspecified: Secondary | ICD-10-CM | POA: Diagnosis not present

## 2024-03-11 DIAGNOSIS — D689 Coagulation defect, unspecified: Secondary | ICD-10-CM | POA: Diagnosis not present

## 2024-03-11 DIAGNOSIS — T7840XA Allergy, unspecified, initial encounter: Secondary | ICD-10-CM | POA: Diagnosis not present

## 2024-03-11 DIAGNOSIS — R52 Pain, unspecified: Secondary | ICD-10-CM | POA: Diagnosis not present

## 2024-03-11 DIAGNOSIS — N186 End stage renal disease: Secondary | ICD-10-CM | POA: Diagnosis not present

## 2024-03-11 DIAGNOSIS — D631 Anemia in chronic kidney disease: Secondary | ICD-10-CM | POA: Diagnosis not present

## 2024-03-11 DIAGNOSIS — Z992 Dependence on renal dialysis: Secondary | ICD-10-CM | POA: Diagnosis not present

## 2024-03-11 DIAGNOSIS — T8249XD Other complication of vascular dialysis catheter, subsequent encounter: Secondary | ICD-10-CM | POA: Diagnosis not present

## 2024-03-13 DIAGNOSIS — D509 Iron deficiency anemia, unspecified: Secondary | ICD-10-CM | POA: Diagnosis not present

## 2024-03-13 DIAGNOSIS — T8249XD Other complication of vascular dialysis catheter, subsequent encounter: Secondary | ICD-10-CM | POA: Diagnosis not present

## 2024-03-13 DIAGNOSIS — T7840XA Allergy, unspecified, initial encounter: Secondary | ICD-10-CM | POA: Diagnosis not present

## 2024-03-13 DIAGNOSIS — R52 Pain, unspecified: Secondary | ICD-10-CM | POA: Diagnosis not present

## 2024-03-13 DIAGNOSIS — R197 Diarrhea, unspecified: Secondary | ICD-10-CM | POA: Diagnosis not present

## 2024-03-13 DIAGNOSIS — N186 End stage renal disease: Secondary | ICD-10-CM | POA: Diagnosis not present

## 2024-03-13 DIAGNOSIS — D689 Coagulation defect, unspecified: Secondary | ICD-10-CM | POA: Diagnosis not present

## 2024-03-13 DIAGNOSIS — N2581 Secondary hyperparathyroidism of renal origin: Secondary | ICD-10-CM | POA: Diagnosis not present

## 2024-03-13 DIAGNOSIS — Z992 Dependence on renal dialysis: Secondary | ICD-10-CM | POA: Diagnosis not present

## 2024-03-13 DIAGNOSIS — D631 Anemia in chronic kidney disease: Secondary | ICD-10-CM | POA: Diagnosis not present

## 2024-03-15 DIAGNOSIS — D689 Coagulation defect, unspecified: Secondary | ICD-10-CM | POA: Diagnosis not present

## 2024-03-15 DIAGNOSIS — R52 Pain, unspecified: Secondary | ICD-10-CM | POA: Diagnosis not present

## 2024-03-15 DIAGNOSIS — Z992 Dependence on renal dialysis: Secondary | ICD-10-CM | POA: Diagnosis not present

## 2024-03-15 DIAGNOSIS — D631 Anemia in chronic kidney disease: Secondary | ICD-10-CM | POA: Diagnosis not present

## 2024-03-15 DIAGNOSIS — T7840XA Allergy, unspecified, initial encounter: Secondary | ICD-10-CM | POA: Diagnosis not present

## 2024-03-15 DIAGNOSIS — D509 Iron deficiency anemia, unspecified: Secondary | ICD-10-CM | POA: Diagnosis not present

## 2024-03-15 DIAGNOSIS — T8249XD Other complication of vascular dialysis catheter, subsequent encounter: Secondary | ICD-10-CM | POA: Diagnosis not present

## 2024-03-15 DIAGNOSIS — R197 Diarrhea, unspecified: Secondary | ICD-10-CM | POA: Diagnosis not present

## 2024-03-15 DIAGNOSIS — N186 End stage renal disease: Secondary | ICD-10-CM | POA: Diagnosis not present

## 2024-03-15 DIAGNOSIS — N2581 Secondary hyperparathyroidism of renal origin: Secondary | ICD-10-CM | POA: Diagnosis not present

## 2024-03-18 DIAGNOSIS — D689 Coagulation defect, unspecified: Secondary | ICD-10-CM | POA: Diagnosis not present

## 2024-03-18 DIAGNOSIS — N186 End stage renal disease: Secondary | ICD-10-CM | POA: Diagnosis not present

## 2024-03-18 DIAGNOSIS — T8249XD Other complication of vascular dialysis catheter, subsequent encounter: Secondary | ICD-10-CM | POA: Diagnosis not present

## 2024-03-18 DIAGNOSIS — Z992 Dependence on renal dialysis: Secondary | ICD-10-CM | POA: Diagnosis not present

## 2024-03-18 DIAGNOSIS — R197 Diarrhea, unspecified: Secondary | ICD-10-CM | POA: Diagnosis not present

## 2024-03-18 DIAGNOSIS — D631 Anemia in chronic kidney disease: Secondary | ICD-10-CM | POA: Diagnosis not present

## 2024-03-18 DIAGNOSIS — T7840XA Allergy, unspecified, initial encounter: Secondary | ICD-10-CM | POA: Diagnosis not present

## 2024-03-18 DIAGNOSIS — D509 Iron deficiency anemia, unspecified: Secondary | ICD-10-CM | POA: Diagnosis not present

## 2024-03-18 DIAGNOSIS — N2581 Secondary hyperparathyroidism of renal origin: Secondary | ICD-10-CM | POA: Diagnosis not present

## 2024-03-18 DIAGNOSIS — R52 Pain, unspecified: Secondary | ICD-10-CM | POA: Diagnosis not present

## 2024-03-19 DIAGNOSIS — M25551 Pain in right hip: Secondary | ICD-10-CM | POA: Diagnosis not present

## 2024-03-19 DIAGNOSIS — M1611 Unilateral primary osteoarthritis, right hip: Secondary | ICD-10-CM | POA: Diagnosis not present

## 2024-03-20 DIAGNOSIS — R52 Pain, unspecified: Secondary | ICD-10-CM | POA: Diagnosis not present

## 2024-03-20 DIAGNOSIS — D509 Iron deficiency anemia, unspecified: Secondary | ICD-10-CM | POA: Diagnosis not present

## 2024-03-20 DIAGNOSIS — Z992 Dependence on renal dialysis: Secondary | ICD-10-CM | POA: Diagnosis not present

## 2024-03-20 DIAGNOSIS — N2581 Secondary hyperparathyroidism of renal origin: Secondary | ICD-10-CM | POA: Diagnosis not present

## 2024-03-20 DIAGNOSIS — T7840XA Allergy, unspecified, initial encounter: Secondary | ICD-10-CM | POA: Diagnosis not present

## 2024-03-20 DIAGNOSIS — N186 End stage renal disease: Secondary | ICD-10-CM | POA: Diagnosis not present

## 2024-03-20 DIAGNOSIS — T8249XD Other complication of vascular dialysis catheter, subsequent encounter: Secondary | ICD-10-CM | POA: Diagnosis not present

## 2024-03-20 DIAGNOSIS — R197 Diarrhea, unspecified: Secondary | ICD-10-CM | POA: Diagnosis not present

## 2024-03-20 DIAGNOSIS — D631 Anemia in chronic kidney disease: Secondary | ICD-10-CM | POA: Diagnosis not present

## 2024-03-20 DIAGNOSIS — D689 Coagulation defect, unspecified: Secondary | ICD-10-CM | POA: Diagnosis not present

## 2024-03-22 DIAGNOSIS — N186 End stage renal disease: Secondary | ICD-10-CM | POA: Diagnosis not present

## 2024-03-22 DIAGNOSIS — T7840XA Allergy, unspecified, initial encounter: Secondary | ICD-10-CM | POA: Diagnosis not present

## 2024-03-22 DIAGNOSIS — T8249XD Other complication of vascular dialysis catheter, subsequent encounter: Secondary | ICD-10-CM | POA: Diagnosis not present

## 2024-03-22 DIAGNOSIS — D509 Iron deficiency anemia, unspecified: Secondary | ICD-10-CM | POA: Diagnosis not present

## 2024-03-22 DIAGNOSIS — D689 Coagulation defect, unspecified: Secondary | ICD-10-CM | POA: Diagnosis not present

## 2024-03-22 DIAGNOSIS — Z992 Dependence on renal dialysis: Secondary | ICD-10-CM | POA: Diagnosis not present

## 2024-03-22 DIAGNOSIS — N2581 Secondary hyperparathyroidism of renal origin: Secondary | ICD-10-CM | POA: Diagnosis not present

## 2024-03-22 DIAGNOSIS — R52 Pain, unspecified: Secondary | ICD-10-CM | POA: Diagnosis not present

## 2024-03-22 DIAGNOSIS — D631 Anemia in chronic kidney disease: Secondary | ICD-10-CM | POA: Diagnosis not present

## 2024-03-22 DIAGNOSIS — R197 Diarrhea, unspecified: Secondary | ICD-10-CM | POA: Diagnosis not present

## 2024-03-25 DIAGNOSIS — N2581 Secondary hyperparathyroidism of renal origin: Secondary | ICD-10-CM | POA: Diagnosis not present

## 2024-03-25 DIAGNOSIS — T7840XA Allergy, unspecified, initial encounter: Secondary | ICD-10-CM | POA: Diagnosis not present

## 2024-03-25 DIAGNOSIS — D631 Anemia in chronic kidney disease: Secondary | ICD-10-CM | POA: Diagnosis not present

## 2024-03-25 DIAGNOSIS — N186 End stage renal disease: Secondary | ICD-10-CM | POA: Diagnosis not present

## 2024-03-25 DIAGNOSIS — T8249XD Other complication of vascular dialysis catheter, subsequent encounter: Secondary | ICD-10-CM | POA: Diagnosis not present

## 2024-03-25 DIAGNOSIS — D689 Coagulation defect, unspecified: Secondary | ICD-10-CM | POA: Diagnosis not present

## 2024-03-25 DIAGNOSIS — Z992 Dependence on renal dialysis: Secondary | ICD-10-CM | POA: Diagnosis not present

## 2024-03-25 DIAGNOSIS — R197 Diarrhea, unspecified: Secondary | ICD-10-CM | POA: Diagnosis not present

## 2024-03-25 DIAGNOSIS — R52 Pain, unspecified: Secondary | ICD-10-CM | POA: Diagnosis not present

## 2024-03-25 DIAGNOSIS — D509 Iron deficiency anemia, unspecified: Secondary | ICD-10-CM | POA: Diagnosis not present

## 2024-03-26 DIAGNOSIS — M25551 Pain in right hip: Secondary | ICD-10-CM | POA: Diagnosis not present

## 2024-03-26 DIAGNOSIS — M1611 Unilateral primary osteoarthritis, right hip: Secondary | ICD-10-CM | POA: Diagnosis not present

## 2024-03-27 DIAGNOSIS — N186 End stage renal disease: Secondary | ICD-10-CM | POA: Diagnosis not present

## 2024-03-27 DIAGNOSIS — D509 Iron deficiency anemia, unspecified: Secondary | ICD-10-CM | POA: Diagnosis not present

## 2024-03-27 DIAGNOSIS — T8249XD Other complication of vascular dialysis catheter, subsequent encounter: Secondary | ICD-10-CM | POA: Diagnosis not present

## 2024-03-27 DIAGNOSIS — T7840XA Allergy, unspecified, initial encounter: Secondary | ICD-10-CM | POA: Diagnosis not present

## 2024-03-27 DIAGNOSIS — D631 Anemia in chronic kidney disease: Secondary | ICD-10-CM | POA: Diagnosis not present

## 2024-03-27 DIAGNOSIS — Z992 Dependence on renal dialysis: Secondary | ICD-10-CM | POA: Diagnosis not present

## 2024-03-27 DIAGNOSIS — D689 Coagulation defect, unspecified: Secondary | ICD-10-CM | POA: Diagnosis not present

## 2024-03-27 DIAGNOSIS — R52 Pain, unspecified: Secondary | ICD-10-CM | POA: Diagnosis not present

## 2024-03-27 DIAGNOSIS — R197 Diarrhea, unspecified: Secondary | ICD-10-CM | POA: Diagnosis not present

## 2024-03-27 DIAGNOSIS — N2581 Secondary hyperparathyroidism of renal origin: Secondary | ICD-10-CM | POA: Diagnosis not present

## 2024-03-29 DIAGNOSIS — Z992 Dependence on renal dialysis: Secondary | ICD-10-CM | POA: Diagnosis not present

## 2024-03-29 DIAGNOSIS — N2581 Secondary hyperparathyroidism of renal origin: Secondary | ICD-10-CM | POA: Diagnosis not present

## 2024-03-29 DIAGNOSIS — D509 Iron deficiency anemia, unspecified: Secondary | ICD-10-CM | POA: Diagnosis not present

## 2024-03-29 DIAGNOSIS — T7840XA Allergy, unspecified, initial encounter: Secondary | ICD-10-CM | POA: Diagnosis not present

## 2024-03-29 DIAGNOSIS — N186 End stage renal disease: Secondary | ICD-10-CM | POA: Diagnosis not present

## 2024-03-29 DIAGNOSIS — T8249XD Other complication of vascular dialysis catheter, subsequent encounter: Secondary | ICD-10-CM | POA: Diagnosis not present

## 2024-03-29 DIAGNOSIS — R52 Pain, unspecified: Secondary | ICD-10-CM | POA: Diagnosis not present

## 2024-03-29 DIAGNOSIS — D689 Coagulation defect, unspecified: Secondary | ICD-10-CM | POA: Diagnosis not present

## 2024-03-29 DIAGNOSIS — R197 Diarrhea, unspecified: Secondary | ICD-10-CM | POA: Diagnosis not present

## 2024-03-29 DIAGNOSIS — D631 Anemia in chronic kidney disease: Secondary | ICD-10-CM | POA: Diagnosis not present

## 2024-04-01 DIAGNOSIS — R197 Diarrhea, unspecified: Secondary | ICD-10-CM | POA: Diagnosis not present

## 2024-04-01 DIAGNOSIS — D509 Iron deficiency anemia, unspecified: Secondary | ICD-10-CM | POA: Diagnosis not present

## 2024-04-01 DIAGNOSIS — Z992 Dependence on renal dialysis: Secondary | ICD-10-CM | POA: Diagnosis not present

## 2024-04-01 DIAGNOSIS — N186 End stage renal disease: Secondary | ICD-10-CM | POA: Diagnosis not present

## 2024-04-01 DIAGNOSIS — D631 Anemia in chronic kidney disease: Secondary | ICD-10-CM | POA: Diagnosis not present

## 2024-04-01 DIAGNOSIS — T7840XA Allergy, unspecified, initial encounter: Secondary | ICD-10-CM | POA: Diagnosis not present

## 2024-04-01 DIAGNOSIS — N2581 Secondary hyperparathyroidism of renal origin: Secondary | ICD-10-CM | POA: Diagnosis not present

## 2024-04-01 DIAGNOSIS — R52 Pain, unspecified: Secondary | ICD-10-CM | POA: Diagnosis not present

## 2024-04-01 DIAGNOSIS — T8249XD Other complication of vascular dialysis catheter, subsequent encounter: Secondary | ICD-10-CM | POA: Diagnosis not present

## 2024-04-01 DIAGNOSIS — D689 Coagulation defect, unspecified: Secondary | ICD-10-CM | POA: Diagnosis not present

## 2024-04-02 ENCOUNTER — Encounter: Payer: Self-pay | Admitting: Student in an Organized Health Care Education/Training Program

## 2024-04-02 ENCOUNTER — Ambulatory Visit
Attending: Student in an Organized Health Care Education/Training Program | Admitting: Student in an Organized Health Care Education/Training Program

## 2024-04-02 ENCOUNTER — Encounter (HOSPITAL_COMMUNITY): Payer: Self-pay

## 2024-04-02 VITALS — BP 152/86 | HR 64 | Ht 72.0 in | Wt 282.0 lb

## 2024-04-02 DIAGNOSIS — N138 Other obstructive and reflux uropathy: Secondary | ICD-10-CM | POA: Diagnosis not present

## 2024-04-02 DIAGNOSIS — I4819 Other persistent atrial fibrillation: Secondary | ICD-10-CM

## 2024-04-02 DIAGNOSIS — N186 End stage renal disease: Secondary | ICD-10-CM | POA: Diagnosis not present

## 2024-04-02 DIAGNOSIS — Z992 Dependence on renal dialysis: Secondary | ICD-10-CM | POA: Diagnosis not present

## 2024-04-02 NOTE — Progress Notes (Unsigned)
  Cardiology Office Note   Date:  04/02/2024  ID:  Devon Cook, DOB 10-Oct-1960, MRN [id number] PCP: Joyce Norleen BROCKS, MD  [facility] Providers Cardiologist: Lynwood Schilling, MD  Electrophysiologist:  Donnice DELENA Primus, MD    History of Present Illness Devon Cook is a 63 y.o. 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   He was most recently seen by Dr. Schilling on 02/27/2024 to establish care and had previously been followed by Dr. Hobart.  He was in AF during that visit and was in NSR on 01/16/2023.  Outpatient ZIO monitor with 100% AF.  TTE on 02/01/2024 with preserved LVEF and only mild left atrial enlargement.  Today presents back to clinic for management of atrial fibrillation and associated stroke risk reduction.  I spoke with him and his wife over the phone about different options for reducing his stroke risk in the setting of persistent atrial fibrillation.  He does not seem to have much rhythm awareness with relationship to his AF.  His rate seems to be well-controlled.  He works for      ROS: ***  Studies Reviewed      *** Risk Assessment/Calculations  CHA2DS2-VASc Score = 2  {Confirm score is correct.  If not, click here to update score.  REFRESH note.  :1} This indicates a 2.2% annual risk of stroke. The patient's score is based upon: CHF History: 1 HTN History: 1 Diabetes History: 0 Stroke History: 0 Vascular Disease History: 0 Age Score: 0 Gender Score: 0   {This patient has a significant risk of stroke if diagnosed with atrial fibrillation.  Please consider VKA or DOAC agent for anticoagulation if the bleeding risk is acceptable.   You can also use the SmartPhrase .[email or web address] for documentation.   :[id number]} No BP recorded.  {Refresh Note OR Click here to enter BP  :1}***   STOP-Bang Score:  8  { Consider Dx Sleep Disordered Breathing or  Sleep Apnea  ICD G47.33          :1}    Physical Exam VS:  There were no vitals taken for this visit.       Wt Readings from Last 3 Encounters:  02/27/24 284 lb 6.4 oz (129 kg)  12/21/23 284 lb 6.4 oz (129 kg)  11/16/23 283 lb 6.4 oz (128.5 kg)    GEN: Well nourished, well developed in no acute distress NECK: No JVD; No carotid bruits CARDIAC: ***RRR, no murmurs, rubs, gallops RESPIRATORY:  Clear to auscultation without rales, wheezing or rhonchi  ABDOMEN: Soft, non-tender, non-distended EXTREMITIES:  No edema; No deformity   ASSESSMENT AND PLAN ***    {Are you ordering a CV Procedure (e.g. stress test, cath, DCCV, TEE, etc)?   Press F2        :[id number]}  Dispo: ***  Signed, Donnice DELENA Primus, MD

## 2024-04-02 NOTE — Patient Instructions (Signed)
 Medication Instructions:  Your physician recommends that you continue on your current medications as directed. Please refer to the Current Medication list given to you today.  *If you need a refill on your cardiac medications before your next appointment, please call your pharmacy*  Lab Work: None ordered.  If you have labs (blood work) drawn today and your tests are completely normal, you will receive your results only by: MyChart Message (if you have MyChart) OR A paper copy in the mail If you have any lab test that is abnormal or we need to change your treatment, we will call you to review the results.  Testing/Procedures: None ordered.   Follow-Up: At [facility], you and your health needs are our priority.  As part of our continuing mission to provide you with exceptional heart care, our providers are all part of one team.  This team includes your primary Cardiologist (physician) and Advanced Practice Providers or APPs (Physician Assistants and Nurse Practitioners) who all work together to provide you with the care you need, when you need it.  Your next appointment:   3 months with Dr Almetta

## 2024-04-04 DIAGNOSIS — M1611 Unilateral primary osteoarthritis, right hip: Secondary | ICD-10-CM | POA: Diagnosis not present

## 2024-04-04 DIAGNOSIS — M25551 Pain in right hip: Secondary | ICD-10-CM | POA: Diagnosis not present

## 2024-04-16 ENCOUNTER — Other Ambulatory Visit: Payer: Self-pay | Admitting: Family Medicine

## 2024-04-16 DIAGNOSIS — J4520 Mild intermittent asthma, uncomplicated: Secondary | ICD-10-CM

## 2024-04-17 ENCOUNTER — Encounter (HOSPITAL_COMMUNITY): Payer: Self-pay

## 2024-04-19 ENCOUNTER — Telehealth: Payer: Self-pay | Admitting: Cardiology

## 2024-04-19 DIAGNOSIS — I429 Cardiomyopathy, unspecified: Secondary | ICD-10-CM

## 2024-04-19 DIAGNOSIS — G4733 Obstructive sleep apnea (adult) (pediatric): Secondary | ICD-10-CM

## 2024-04-19 DIAGNOSIS — I5020 Unspecified systolic (congestive) heart failure: Secondary | ICD-10-CM

## 2024-04-19 DIAGNOSIS — I10 Essential (primary) hypertension: Secondary | ICD-10-CM

## 2024-04-19 DIAGNOSIS — I4819 Other persistent atrial fibrillation: Secondary | ICD-10-CM

## 2024-04-19 NOTE — Telephone Encounter (Signed)
**Note De-Identified Bari Leib Obfuscation** The pt is scheduled to have a in-lab NPSG on 10/22. The pt is aware that I am forwarding this message to Dr Shlomo for her recommendation.

## 2024-04-19 NOTE — Telephone Encounter (Signed)
 Patient calling to see if the sleep study can be down at home or do it have to be in clinic. Please advise

## 2024-04-22 NOTE — Telephone Encounter (Addendum)
**Note De-Identified Elzena Muston Obfuscation** Devon Wilbert SAUNDERS, MD to Me (Selected Message)    04/22/24  8:24 AM Devon Cook is fine  I have placed the order for a Devon-Cook and I have made the pt aware that it is ok for him to do a home sleep study rather than a NPSG. The pt is aware that the [employer] will be doing the PA through his insurance, [facility] and that they will provide the device to him Devon Cook the mail. He verbalized understanding and thanked me for my assistance.  I contacted the sleep lab and advised them that the pt will not need the NPSG that is scheduled for 04/24/24 at 8 pm. Amy, at the sleep lab stated that she is cancelling the pts NPSG appointment. I have cancelled the NPSG order.

## 2024-04-24 ENCOUNTER — Encounter (HOSPITAL_BASED_OUTPATIENT_CLINIC_OR_DEPARTMENT_OTHER): Admitting: Cardiology

## 2024-05-01 ENCOUNTER — Telehealth: Payer: Self-pay | Admitting: Family Medicine

## 2024-05-01 DIAGNOSIS — F419 Anxiety disorder, unspecified: Secondary | ICD-10-CM

## 2024-05-01 MED ORDER — ALPRAZOLAM 0.25 MG PO TABS: 0.2500 mg | ORAL_TABLET | Freq: Every day | ORAL | 0 refills | Status: DC | PRN

## 2024-05-01 NOTE — Telephone Encounter (Signed)
Pt requesting refill on alprazolam  

## 2024-05-04 ENCOUNTER — Telehealth: Payer: Self-pay | Admitting: Student in an Organized Health Care Education/Training Program

## 2024-05-04 DIAGNOSIS — I4819 Other persistent atrial fibrillation: Secondary | ICD-10-CM

## 2024-05-04 MED ORDER — APIXABAN 5 MG PO TABS: 5.0000 mg | ORAL_TABLET | Freq: Two times a day (BID) | ORAL | 3 refills | Status: AC

## 2024-05-04 NOTE — Telephone Encounter (Signed)
 Spoke with Mr. Lagrand this weekend and reviewed options for management of stroke risk related to AF.  He had spoken with Dr. Gearline who manages his CKD and felt that he could try OAC.  I reviewed Dr. Alfonzo thoughts from their last visit with his chronic anemia.  Mr. Baney would prefer to try taking Eliquis and see if he has any ongoing issues with worsening anemia.  His last CBC on 06/23/2023 with Hb 9.6. Sent apixaban 5 mg bid (62M, 127 kg, iHD) to his [facility]. Rec'd he d/c ASA when starting this. He is agreeable.   Donnice DELENA Primus, MD [facility]  Cardiac Electrophysiology

## 2024-05-06 ENCOUNTER — Telehealth: Payer: Self-pay

## 2024-05-06 ENCOUNTER — Encounter (HOSPITAL_BASED_OUTPATIENT_CLINIC_OR_DEPARTMENT_OTHER): Admitting: Cardiology

## 2024-05-06 DIAGNOSIS — G4733 Obstructive sleep apnea (adult) (pediatric): Secondary | ICD-10-CM

## 2024-05-06 NOTE — Telephone Encounter (Signed)
 Called patient to reschedule appt with Dr. Almetta due to his schedule change. Arranged appt 11/25 at 11:15 AM.

## 2024-05-13 ENCOUNTER — Encounter: Payer: Self-pay | Admitting: Cardiology

## 2024-05-14 ENCOUNTER — Ambulatory Visit: Attending: Cardiology

## 2024-05-14 DIAGNOSIS — G4733 Obstructive sleep apnea (adult) (pediatric): Secondary | ICD-10-CM

## 2024-05-14 DIAGNOSIS — I4819 Other persistent atrial fibrillation: Secondary | ICD-10-CM

## 2024-05-14 DIAGNOSIS — I429 Cardiomyopathy, unspecified: Secondary | ICD-10-CM

## 2024-05-14 DIAGNOSIS — I5020 Unspecified systolic (congestive) heart failure: Secondary | ICD-10-CM

## 2024-05-14 DIAGNOSIS — I10 Essential (primary) hypertension: Secondary | ICD-10-CM

## 2024-05-14 NOTE — Procedures (Signed)
   SLEEP STUDY REPORT Patient Information Study Date: 05/06/2024 Patient Name: Devon Cook Patient ID: [id number] Birth Date: 24-Jul-1960 Age: 63 Gender: Male BMI: 38.2 (W=282 lb, H=6' 0'') Referring Physician: Lynwood Schilling, MD  TEST DESCRIPTION: Home sleep apnea testing was completed using the WatchPat, a Type 1 device, utilizing peripheral arterial tonometry (PAT), chest movement, actigraphy, pulse oximetry, pulse rate, body position and snore. AHI was calculated with apnea and hypopnea using valid sleep time as the denominator. RDI includes apneas, hypopneas, and RERAs. The data acquired and the scoring of sleep and all associated events were performed in accordance with the recommended standards and specifications as outlined in the AASM Manual for the Scoring of Sleep and Associated Events 2.2.0 (2015).  FINDINGS:  1. Severe Obstructive Sleep Apnea with AHI 74.5/hr.  2. Severe Central Sleep Apnea with pAHIc 39.9/hr.  3. Oxygen desaturations as low as 77%.  4. Severe snoring was present. O2 sats were < 88% for 5.1 min.  5. Total sleep time was 1 hrs and 54 min.  6. 13% of total sleep time was spent in REM sleep.  7. Shortened sleep onset latency at 5 min.  8. Normal REM sleep onset latency at 87 min.  9. Total awakenings were 19. 10. Arrhythmia detection: Suggestive of possible brief atrial fibrillation lasting 3 hours 25 min and 23 seconds. This is not diagnostic and further testing with outpatient telemetry monitoring is recommended.  DIAGNOSIS: Severe Obstructive Sleep Apnea (G47.33) Severe Central Sleep Apnea Nocturnal Hypoxemia Possible Atrial FIbrillation  RECOMMENDATIONS: 1. Clinical correlation of these findings is necessary. The decision to treat obstructive sleep apnea (OSA) is usually based on the presence of apnea symptoms or the presence of associated medical conditions such as Hypertension, Congestive Heart Failure, Atrial Fibrillation or Obesity. The most  common symptoms of OSA are snoring, gasping for breath while sleeping, daytime sleepiness and fatigue. 2. Initiating apnea therapy is recommended given the presence of symptoms and/or associated conditions. Recommend proceeding with one of the following:  a. Auto-CPAP therapy with a pressure range of 5-20cm H2O.  b. An oral appliance (OA) that can be obtained from certain dentists with expertise in sleep medicine. These are primarily of use in non-obese patients with mild and moderate disease.  c. An ENT consultation which may be useful to look for specific causes of obstruction and possible treatment options.  d. If patient is intolerant to PAP therapy, consider referral to ENT for evaluation for hypoglossal nerve stimulator. 3. Close follow-up is necessary to ensure success with CPAP or oral appliance therapy for maximum benefit . 4. A follow-up oximetry study on CPAP is recommended to assess the adequacy of therapy and determine the need for supplemental oxygen or the potential need for Bi-level therapy. An arterial blood gas to determine the adequacy of baseline ventilation and oxygenation should also be considered. 5. Healthy sleep recommendations include: adequate nightly sleep (normal 7-9 hrs/night), avoidance of caffeine after noon and alcohol near bedtime, and maintaining a sleep environment that is cool, dark and quiet. 6. Weight loss for overweight patients is recommended. Even modest amounts of weight loss can significantly improve the severity of sleep apnea. 7. Snoring recommendations include: weight loss where appropriate, side sleeping, and avoidance of alcohol before bed. 8. Operation of motor vehicle should be avoided when sleepy.  Signature: Wilbert Bihari, MD; [facility]; Diplomat, American Board of Sleep Medicine Electronically Signed: 05/14/2024 3:50:49 PM 05/06/2024,[id number],[birth year],Male Page 2 of 5 Rev. 351 Printed o

## 2024-05-17 NOTE — Progress Notes (Signed)
 He has F/U with heart care 05/28/24

## 2024-05-28 ENCOUNTER — Ambulatory Visit: Admitting: Student in an Organized Health Care Education/Training Program

## 2024-05-28 ENCOUNTER — Telehealth: Payer: Self-pay | Admitting: *Deleted

## 2024-05-28 DIAGNOSIS — I10 Essential (primary) hypertension: Secondary | ICD-10-CM

## 2024-05-28 DIAGNOSIS — I5020 Unspecified systolic (congestive) heart failure: Secondary | ICD-10-CM

## 2024-05-28 DIAGNOSIS — I4819 Other persistent atrial fibrillation: Secondary | ICD-10-CM

## 2024-05-28 DIAGNOSIS — G4733 Obstructive sleep apnea (adult) (pediatric): Secondary | ICD-10-CM

## 2024-05-28 NOTE — Telephone Encounter (Signed)
-----   Message from Wilbert Bihari sent at 05/14/2024  3:52 PM EST ----- Please let patient know that they have sleep apnea.  Recommend therapeutic CPAP titration for treatment of patient's sleep disordered breathing.

## 2024-06-11 ENCOUNTER — Ambulatory Visit: Admitting: Student in an Organized Health Care Education/Training Program

## 2024-06-11 NOTE — Telephone Encounter (Signed)
 The patient has been notified of the result and verbalized understanding.  All questions (if any) were answered. Joshua Dalton Seip, CMA 06/11/2024 11:42 AM     Precert titration

## 2024-06-11 NOTE — Telephone Encounter (Signed)
 The patient has been notified of the result and verbalized understanding.  All questions (if any) were answered. Joshua Dalton Seip, CMA 06/11/2024 11:54 AM

## 2024-06-28 NOTE — Telephone Encounter (Signed)
**Note De-Identified Husna Krone Obfuscation** Per the [facility] Provider Portal, a PA is not required for a CPAP Titration (CPT Code: 04188). Decision ID #: [id number]  I have transferred the order to the Sleep Lab.  I called the pt and made him aware that a PA is not required for his CPAP Titration. I gave him him the sleep lab's phone number so he can call them to be scheduled. He verbalized understanding to all information given and thanked me for my call.

## 2024-07-19 ENCOUNTER — Ambulatory Visit: Admitting: Student in an Organized Health Care Education/Training Program

## 2024-07-22 ENCOUNTER — Other Ambulatory Visit: Payer: Self-pay | Admitting: Family Medicine

## 2024-07-22 DIAGNOSIS — F419 Anxiety disorder, unspecified: Secondary | ICD-10-CM

## 2024-07-23 ENCOUNTER — Ambulatory Visit: Admitting: Family Medicine

## 2024-07-23 ENCOUNTER — Telehealth: Admitting: Family Medicine

## 2024-07-23 ENCOUNTER — Encounter: Payer: Self-pay | Admitting: Family Medicine

## 2024-07-23 VITALS — Ht 72.0 in | Wt 282.0 lb

## 2024-07-23 DIAGNOSIS — J4520 Mild intermittent asthma, uncomplicated: Secondary | ICD-10-CM

## 2024-07-23 DIAGNOSIS — J069 Acute upper respiratory infection, unspecified: Secondary | ICD-10-CM | POA: Diagnosis not present

## 2024-07-23 MED ORDER — ALBUTEROL SULFATE HFA 108 (90 BASE) MCG/ACT IN AERS: 2.0000 | INHALATION_SPRAY | Freq: Four times a day (QID) | RESPIRATORY_TRACT | 0 refills | Status: AC | PRN

## 2024-07-23 NOTE — Progress Notes (Signed)
" ° °  Subjective:    Patient ID: Devon Cook, male    DOB: 1960-08-07, 64 y.o.   MRN: [id number]  HPI Documentation for virtual audio and video telecommunications through [place] encounter:  The patient was located at home. 2 patient identifiers used.  The provider was located in the office. The patient did consent to this visit and is aware of possible charges through their insurance for this visit.  The other persons participating in this telemedicine service were none. Time spent on call was 5 minutes and in review of previous records >20 minutes total for counseling and coordination of care.  This virtual service is not related to other E/M service within previous 7 days.  He complains of a 3-day history of difficulty with nasal congestion, hoarse voice, PND, sneezing but no fever, chills, sore throat or earache.  He has been using OTC medications with some relief of his symptoms.  He mentioned Mucinex and NyQuil.  He also is scheduled for a sleep study apparently set up by his cardiologist.  Review of Systems     Objective:    Physical Exam  Alert and in no distress otherwise not examined      Assessment & Plan:  Viral URI Recommend continuing OTC medications and mentioned Claritin-D to help with drainage as well as congestion. He would also like his albuterol  renewed.  I asked was reviewed yesterday. "

## 2024-07-24 ENCOUNTER — Ambulatory Visit (HOSPITAL_BASED_OUTPATIENT_CLINIC_OR_DEPARTMENT_OTHER): Attending: Cardiology | Admitting: Cardiology

## 2024-07-24 DIAGNOSIS — I10 Essential (primary) hypertension: Secondary | ICD-10-CM

## 2024-07-24 DIAGNOSIS — I4819 Other persistent atrial fibrillation: Secondary | ICD-10-CM | POA: Insufficient documentation

## 2024-07-24 DIAGNOSIS — G4733 Obstructive sleep apnea (adult) (pediatric): Secondary | ICD-10-CM | POA: Diagnosis not present

## 2024-07-24 DIAGNOSIS — I110 Hypertensive heart disease with heart failure: Secondary | ICD-10-CM | POA: Diagnosis not present

## 2024-07-24 DIAGNOSIS — I5020 Unspecified systolic (congestive) heart failure: Secondary | ICD-10-CM | POA: Insufficient documentation

## 2024-07-29 NOTE — Procedures (Signed)
" °  Indications for Polysomnography The patient is a 63 year old Male who is 6' 1 and weighs 285.0 lbs. His BMI equals 37.8.  A full night titration treatment study was performed.  Medications taken.ALLOPURINOLPANTOPRAZOLEXTANDIMETOPROLOL SUCCINATEAMLODIPINEELIQUIS Polysomnogram Data A full night polysomnogram recorded the standard physiologic parameters including EEG, EOG, EMG, EKG, nasal and oral airflow.  Respiratory parameters of chest and abdominal movements were recorded with Respiratory Inductance Plethysmography belts.   Oxygen saturation was recorded by pulse oximetry.  Sleep Architecture The total recording time of the polysomnogram was 384.1 minutes.  The total sleep time was 249.5 minutes.  The patient spent 20.4% of total sleep time in Stage N1, 38.1% in Stage N2, 0.0% in Stages N3, and 41.5% in REM.  Sleep latency was 8.6 minutes.   REM latency was 161.5 minutes.  Sleep Efficiency was 65.0%.  Wake after Sleep Onset time was 126.5 minutes.  Titration Summary The patient was titrated at pressures ranging from 5 cm/H20 up to 23/19 cm/H20.  The last pressure used in the study was 23/19 cm/H20.  Respiratory Events The polysomnogram revealed a presence of 58 obstructive, 13 centrals, and 0 mixed apneas resulting in an Apnea index of 17.1 events per hour.  There were 142 hypopneas (GreaterEqual to3% desaturation and/or arousal) resulting in an Apnea\Hypopnea Index  (AHI GreaterEqual to3% desaturation and/or arousal) of 51.2 events per hour.  There were 89 hypopneas (GreaterEqual to4% desaturation) resulting in an Apnea\Hypopnea Index (AHI GreaterEqual to4% desaturation) of 38.5 events per hour.  There were 23  Respiratory Effort Related Arousals resulting in a RERA index of 5.5 events per hour. The Respiratory Disturbance Index is 56.8 events per hour.  The snore index was 0 events per hour.  Mean oxygen saturation was 95.7%.  The lowest oxygen saturation during sleep was 85.0%.  Time spent  LessEqual to88% oxygen saturation was  minutes ().  Limb Activity There were 62 limb movements recorded.  Of this total, 62 were classified as PLMs.  Of the PLMs, 7 were associated with arousals.  The Limb Movement index was 14.9 per hour while the PLM index was 14.9 per hour.  Cardiac Summary The average pulse rate was 83.7 bpm.  The minimum pulse rate was 49.0 bpm while the maximum pulse rate was 108.0 bpm.  Cardiac rhythm was Atrial Fibrillation with PVCs.  Diagnosis: Obstructive Sleep Apnea Unsuccessful CPAP Titration due to ongoing respiratory events.  Recommendations: 1. Recommend a full night BiPAP titration in lab. 2. Healthy sleep recommendations include:  adequate nightly sleep (normal 7-9 hrs/night), avoidance of caffeine after noon and alcohol near bedtime, and maintaining a sleep environment that is cool, dark and quiet. 3. Weight loss for overweight patients is recommended.  Even modest amounts of weight loss can significantly improve the severity of sleep apnea. 4. Snoring recommendations include:  weight loss where appropriate, side sleeping, and avoidance of alcohol before bed. 5. Operation of motor vehicle should be avoided when sleepy.    This study was personally reviewed and electronically signed by: DR. WILBERT BIHARI Accredited Board Certified in Sleep Medicine Date/Time: 07/29/2024 8:33PM "

## 2024-07-30 ENCOUNTER — Encounter: Payer: Self-pay | Admitting: Family Medicine

## 2024-08-01 ENCOUNTER — Other Ambulatory Visit: Payer: Self-pay

## 2024-08-02 ENCOUNTER — Encounter: Payer: Self-pay | Admitting: Cardiology

## 2024-08-13 ENCOUNTER — Ambulatory Visit (HOSPITAL_COMMUNITY)

## 2024-08-15 ENCOUNTER — Ambulatory Visit: Admitting: Physician Assistant
